# Patient Record
Sex: Female | Born: 1950
Health system: Southern US, Community
[De-identification: ages and names within clinical notes are randomized; demographics above are authoritative.]

## PROBLEM LIST (undated history)

## (undated) DIAGNOSIS — M109 Gout, unspecified: Secondary | ICD-10-CM

## (undated) DIAGNOSIS — D126 Benign neoplasm of colon, unspecified: Secondary | ICD-10-CM

## (undated) DIAGNOSIS — G4730 Sleep apnea, unspecified: Secondary | ICD-10-CM

## (undated) DIAGNOSIS — J449 Chronic obstructive pulmonary disease, unspecified: Secondary | ICD-10-CM

## (undated) DIAGNOSIS — I2510 Atherosclerotic heart disease of native coronary artery without angina pectoris: Secondary | ICD-10-CM

## (undated) DIAGNOSIS — A048 Other specified bacterial intestinal infections: Secondary | ICD-10-CM

## (undated) DIAGNOSIS — F419 Anxiety disorder, unspecified: Secondary | ICD-10-CM

## (undated) DIAGNOSIS — E119 Type 2 diabetes mellitus without complications: Secondary | ICD-10-CM

## (undated) DIAGNOSIS — K5792 Diverticulitis of intestine, part unspecified, without perforation or abscess without bleeding: Secondary | ICD-10-CM

## (undated) DIAGNOSIS — K3184 Gastroparesis: Secondary | ICD-10-CM

## (undated) DIAGNOSIS — K219 Gastro-esophageal reflux disease without esophagitis: Secondary | ICD-10-CM

## (undated) DIAGNOSIS — E785 Hyperlipidemia, unspecified: Secondary | ICD-10-CM

## (undated) DIAGNOSIS — D473 Essential (hemorrhagic) thrombocythemia: Secondary | ICD-10-CM

## (undated) DIAGNOSIS — I10 Essential (primary) hypertension: Secondary | ICD-10-CM

## (undated) DIAGNOSIS — F32A Depression, unspecified: Secondary | ICD-10-CM

## (undated) DIAGNOSIS — M1990 Unspecified osteoarthritis, unspecified site: Secondary | ICD-10-CM

## (undated) DIAGNOSIS — N289 Disorder of kidney and ureter, unspecified: Secondary | ICD-10-CM

## (undated) DIAGNOSIS — F329 Major depressive disorder, single episode, unspecified: Secondary | ICD-10-CM

## (undated) DIAGNOSIS — T7840XA Allergy, unspecified, initial encounter: Secondary | ICD-10-CM

## (undated) HISTORY — DX: Disorder of kidney and ureter, unspecified: N28.9

## (undated) HISTORY — DX: Essential (hemorrhagic) thrombocythemia: D47.3

## (undated) HISTORY — PX: TOTAL KNEE ARTHROPLASTY: SHX125

## (undated) HISTORY — DX: Atherosclerotic heart disease of native coronary artery without angina pectoris: I25.10

## (undated) HISTORY — DX: Hyperlipidemia, unspecified: E78.5

## (undated) HISTORY — DX: Anxiety disorder, unspecified: F41.9

## (undated) HISTORY — DX: Chronic obstructive pulmonary disease, unspecified: J44.9

## (undated) HISTORY — DX: Allergy, unspecified, initial encounter: T78.40XA

## (undated) HISTORY — DX: Gastroparesis: K31.84

## (undated) HISTORY — DX: Diverticulitis of intestine, part unspecified, without perforation or abscess without bleeding: K57.92

## (undated) HISTORY — PX: ABDOMINAL HYSTERECTOMY: SHX81

## (undated) HISTORY — DX: Gout, unspecified: M10.9

## (undated) HISTORY — DX: Benign neoplasm of colon, unspecified: D12.6

## (undated) HISTORY — DX: Sleep apnea, unspecified: G47.30

## (undated) HISTORY — DX: Depression, unspecified: F32.A

## (undated) HISTORY — PX: CORONARY ANGIOPLASTY WITH STENT PLACEMENT: SHX49

## (undated) HISTORY — DX: Unspecified osteoarthritis, unspecified site: M19.90

## (undated) HISTORY — DX: Essential (primary) hypertension: I10

## (undated) HISTORY — DX: Type 2 diabetes mellitus without complications: E11.9

## (undated) HISTORY — DX: Major depressive disorder, single episode, unspecified: F32.9

## (undated) HISTORY — PX: CATARACT EXTRACTION: SUR2

## (undated) HISTORY — DX: Other specified bacterial intestinal infections: A04.8

---

## 2005-03-14 ENCOUNTER — Ambulatory Visit: Payer: Self-pay | Admitting: Family Medicine

## 2006-03-14 ENCOUNTER — Ambulatory Visit: Payer: Self-pay | Admitting: Internal Medicine

## 2006-10-11 ENCOUNTER — Ambulatory Visit: Payer: Self-pay

## 2006-11-15 ENCOUNTER — Ambulatory Visit: Payer: Self-pay | Admitting: Family Medicine

## 2006-12-18 ENCOUNTER — Ambulatory Visit: Payer: Self-pay | Admitting: Family Medicine

## 2007-01-16 ENCOUNTER — Ambulatory Visit: Payer: Self-pay | Admitting: Family Medicine

## 2007-04-02 ENCOUNTER — Other Ambulatory Visit: Payer: Self-pay

## 2007-04-02 ENCOUNTER — Emergency Department: Payer: Self-pay | Admitting: Emergency Medicine

## 2007-04-04 ENCOUNTER — Ambulatory Visit: Payer: Self-pay | Admitting: Ophthalmology

## 2007-04-09 ENCOUNTER — Ambulatory Visit: Payer: Self-pay | Admitting: Ophthalmology

## 2008-05-18 ENCOUNTER — Ambulatory Visit: Payer: Self-pay | Admitting: Vascular Surgery

## 2008-07-19 ENCOUNTER — Emergency Department: Payer: Self-pay | Admitting: Emergency Medicine

## 2009-05-18 ENCOUNTER — Ambulatory Visit: Payer: Self-pay | Admitting: Family Medicine

## 2009-05-20 ENCOUNTER — Ambulatory Visit: Payer: Self-pay | Admitting: Family Medicine

## 2009-08-31 ENCOUNTER — Other Ambulatory Visit: Payer: Self-pay | Admitting: Internal Medicine

## 2009-09-01 ENCOUNTER — Ambulatory Visit: Payer: Self-pay | Admitting: Internal Medicine

## 2010-04-12 ENCOUNTER — Ambulatory Visit: Payer: Self-pay | Admitting: Family Medicine

## 2010-05-03 ENCOUNTER — Ambulatory Visit: Payer: Self-pay | Admitting: Orthopedic Surgery

## 2010-05-12 ENCOUNTER — Inpatient Hospital Stay: Payer: Self-pay | Admitting: Orthopedic Surgery

## 2010-05-18 ENCOUNTER — Ambulatory Visit: Payer: Self-pay | Admitting: Orthopedic Surgery

## 2010-09-15 ENCOUNTER — Ambulatory Visit: Payer: Self-pay | Admitting: Family Medicine

## 2011-01-26 ENCOUNTER — Ambulatory Visit: Payer: Self-pay | Admitting: Family Medicine

## 2012-02-07 ENCOUNTER — Ambulatory Visit: Payer: Self-pay | Admitting: Orthopedic Surgery

## 2012-02-07 LAB — CBC WITH DIFFERENTIAL/PLATELET
Eosinophil #: 0.1 10*3/uL (ref 0.0–0.7)
HGB: 13.7 g/dL (ref 12.0–16.0)
Lymphocyte #: 3.1 10*3/uL (ref 1.0–3.6)
Lymphocyte %: 48.9 %
Monocyte %: 8.5 %
Neutrophil #: 2.5 10*3/uL (ref 1.4–6.5)
Neutrophil %: 40.2 %
RBC: 4.51 10*6/uL (ref 3.80–5.20)
RDW: 16.9 % — ABNORMAL HIGH (ref 11.5–14.5)
WBC: 6.3 10*3/uL (ref 3.6–11.0)

## 2012-02-07 LAB — BASIC METABOLIC PANEL
Anion Gap: 9 (ref 7–16)
BUN: 12 mg/dL (ref 7–18)
Chloride: 107 mmol/L (ref 98–107)
Co2: 27 mmol/L (ref 21–32)
EGFR (Non-African Amer.): >60
Glucose: 144 mg/dL — ABNORMAL HIGH (ref 65–99)
Osmolality: 287 (ref 275–301)

## 2012-02-08 ENCOUNTER — Ambulatory Visit: Payer: Self-pay | Admitting: Orthopedic Surgery

## 2012-04-18 ENCOUNTER — Encounter: Payer: Self-pay | Admitting: Rheumatology

## 2012-05-05 ENCOUNTER — Encounter: Payer: Self-pay | Admitting: Rheumatology

## 2012-05-08 ENCOUNTER — Ambulatory Visit: Payer: Self-pay | Admitting: Orthopedic Surgery

## 2012-05-08 LAB — CBC WITH DIFFERENTIAL/PLATELET
Basophil #: 0 10*3/uL (ref 0.0–0.1)
Basophil %: 0.3 %
Eosinophil #: 0.2 10*3/uL (ref 0.0–0.7)
HCT: 40.9 % (ref 35.0–47.0)
HGB: 13.5 g/dL (ref 12.0–16.0)
Lymphocyte #: 4.4 10*3/uL — ABNORMAL HIGH (ref 1.0–3.6)
Lymphocyte %: 42.6 %
MCHC: 33 g/dL (ref 32.0–36.0)
Neutrophil #: 5 10*3/uL (ref 1.4–6.5)
Neutrophil %: 49.1 %
RDW: 17 % — ABNORMAL HIGH (ref 11.5–14.5)
WBC: 10.3 10*3/uL (ref 3.6–11.0)

## 2012-05-08 LAB — MRSA PCR SCREENING

## 2012-05-08 LAB — BASIC METABOLIC PANEL
Anion Gap: 4 — ABNORMAL LOW (ref 7–16)
BUN: 16 mg/dL (ref 7–18)
Calcium, Total: 9.3 mg/dL (ref 8.5–10.1)
Co2: 31 mmol/L (ref 21–32)
Creatinine: 0.71 mg/dL (ref 0.60–1.30)
EGFR (African American): >60
EGFR (Non-African Amer.): >60
Osmolality: 280 (ref 275–301)

## 2012-05-14 ENCOUNTER — Inpatient Hospital Stay: Payer: Self-pay | Admitting: Orthopedic Surgery

## 2012-05-15 LAB — BASIC METABOLIC PANEL
Anion Gap: 8 (ref 7–16)
Calcium, Total: 8.7 mg/dL (ref 8.5–10.1)
Co2: 24 mmol/L (ref 21–32)
Creatinine: 0.48 mg/dL — ABNORMAL LOW (ref 0.60–1.30)
EGFR (African American): >60
EGFR (Non-African Amer.): >60
Glucose: 155 mg/dL — ABNORMAL HIGH (ref 65–99)
Potassium: 3.6 mmol/L (ref 3.5–5.1)
Sodium: 136 mmol/L (ref 136–145)

## 2012-05-15 LAB — HEMOGLOBIN: HGB: 12.6 g/dL (ref 12.0–16.0)

## 2012-05-15 LAB — PLATELET COUNT: Platelet: 264 10*3/uL (ref 150–440)

## 2012-06-03 ENCOUNTER — Encounter: Payer: Self-pay | Admitting: Orthopedic Surgery

## 2012-06-04 ENCOUNTER — Encounter: Payer: Self-pay | Admitting: Orthopedic Surgery

## 2012-07-05 ENCOUNTER — Encounter: Payer: Self-pay | Admitting: Orthopedic Surgery

## 2012-09-24 ENCOUNTER — Ambulatory Visit: Payer: Self-pay | Admitting: Family Medicine

## 2013-09-19 ENCOUNTER — Ambulatory Visit: Payer: Self-pay | Admitting: Family Medicine

## 2013-10-02 ENCOUNTER — Ambulatory Visit: Payer: Self-pay | Admitting: Family Medicine

## 2013-11-12 ENCOUNTER — Encounter: Payer: Self-pay | Admitting: Family Medicine

## 2013-11-13 ENCOUNTER — Ambulatory Visit: Payer: Self-pay | Admitting: Family Medicine

## 2013-12-03 ENCOUNTER — Encounter: Payer: Self-pay | Admitting: Family Medicine

## 2014-01-02 ENCOUNTER — Encounter: Payer: Self-pay | Admitting: Family Medicine

## 2014-01-02 HISTORY — PX: COLONOSCOPY: SHX174

## 2014-01-20 ENCOUNTER — Ambulatory Visit: Payer: Self-pay | Admitting: Gastroenterology

## 2014-01-20 DIAGNOSIS — D126 Benign neoplasm of colon, unspecified: Secondary | ICD-10-CM

## 2014-01-20 HISTORY — DX: Benign neoplasm of colon, unspecified: D12.6

## 2014-01-21 LAB — PATHOLOGY REPORT

## 2014-09-05 DIAGNOSIS — G4733 Obstructive sleep apnea (adult) (pediatric): Secondary | ICD-10-CM | POA: Diagnosis not present

## 2014-09-16 ENCOUNTER — Ambulatory Visit: Payer: Self-pay | Admitting: Family Medicine

## 2014-09-16 DIAGNOSIS — I10 Essential (primary) hypertension: Secondary | ICD-10-CM | POA: Diagnosis not present

## 2014-09-16 DIAGNOSIS — K30 Functional dyspepsia: Secondary | ICD-10-CM | POA: Diagnosis not present

## 2014-09-16 DIAGNOSIS — G4733 Obstructive sleep apnea (adult) (pediatric): Secondary | ICD-10-CM | POA: Diagnosis not present

## 2014-09-16 DIAGNOSIS — R5383 Other fatigue: Secondary | ICD-10-CM | POA: Diagnosis not present

## 2014-09-16 DIAGNOSIS — R946 Abnormal results of thyroid function studies: Secondary | ICD-10-CM | POA: Diagnosis not present

## 2014-09-16 DIAGNOSIS — R079 Chest pain, unspecified: Secondary | ICD-10-CM | POA: Diagnosis not present

## 2014-09-16 DIAGNOSIS — I2510 Atherosclerotic heart disease of native coronary artery without angina pectoris: Secondary | ICD-10-CM | POA: Diagnosis not present

## 2014-10-06 DIAGNOSIS — G4733 Obstructive sleep apnea (adult) (pediatric): Secondary | ICD-10-CM | POA: Diagnosis not present

## 2014-10-06 DIAGNOSIS — E1142 Type 2 diabetes mellitus with diabetic polyneuropathy: Secondary | ICD-10-CM | POA: Diagnosis not present

## 2014-10-06 DIAGNOSIS — R011 Cardiac murmur, unspecified: Secondary | ICD-10-CM | POA: Diagnosis not present

## 2014-10-06 DIAGNOSIS — L851 Acquired keratosis [keratoderma] palmaris et plantaris: Secondary | ICD-10-CM | POA: Diagnosis not present

## 2014-10-06 DIAGNOSIS — R0602 Shortness of breath: Secondary | ICD-10-CM | POA: Diagnosis not present

## 2014-10-06 DIAGNOSIS — B351 Tinea unguium: Secondary | ICD-10-CM | POA: Diagnosis not present

## 2014-10-06 DIAGNOSIS — R079 Chest pain, unspecified: Secondary | ICD-10-CM | POA: Diagnosis not present

## 2014-10-06 DIAGNOSIS — I209 Angina pectoris, unspecified: Secondary | ICD-10-CM | POA: Diagnosis not present

## 2014-10-15 DIAGNOSIS — R0602 Shortness of breath: Secondary | ICD-10-CM | POA: Diagnosis not present

## 2014-10-15 DIAGNOSIS — R079 Chest pain, unspecified: Secondary | ICD-10-CM | POA: Diagnosis not present

## 2014-10-15 DIAGNOSIS — R011 Cardiac murmur, unspecified: Secondary | ICD-10-CM | POA: Diagnosis not present

## 2014-10-29 DIAGNOSIS — R0602 Shortness of breath: Secondary | ICD-10-CM | POA: Diagnosis not present

## 2014-10-29 DIAGNOSIS — I10 Essential (primary) hypertension: Secondary | ICD-10-CM | POA: Diagnosis not present

## 2014-10-29 DIAGNOSIS — E119 Type 2 diabetes mellitus without complications: Secondary | ICD-10-CM | POA: Diagnosis not present

## 2014-10-29 DIAGNOSIS — E784 Other hyperlipidemia: Secondary | ICD-10-CM | POA: Diagnosis not present

## 2014-11-04 DIAGNOSIS — G4733 Obstructive sleep apnea (adult) (pediatric): Secondary | ICD-10-CM | POA: Diagnosis not present

## 2014-11-24 DIAGNOSIS — E11618 Type 2 diabetes mellitus with other diabetic arthropathy: Secondary | ICD-10-CM | POA: Diagnosis not present

## 2014-11-24 DIAGNOSIS — M7502 Adhesive capsulitis of left shoulder: Secondary | ICD-10-CM | POA: Diagnosis not present

## 2014-11-24 DIAGNOSIS — G4733 Obstructive sleep apnea (adult) (pediatric): Secondary | ICD-10-CM | POA: Diagnosis not present

## 2014-11-24 DIAGNOSIS — E1143 Type 2 diabetes mellitus with diabetic autonomic (poly)neuropathy: Secondary | ICD-10-CM | POA: Diagnosis not present

## 2014-11-24 DIAGNOSIS — A048 Other specified bacterial intestinal infections: Secondary | ICD-10-CM | POA: Diagnosis not present

## 2014-11-24 DIAGNOSIS — K3184 Gastroparesis: Secondary | ICD-10-CM | POA: Diagnosis not present

## 2014-11-24 DIAGNOSIS — M109 Gout, unspecified: Secondary | ICD-10-CM | POA: Diagnosis not present

## 2014-11-24 DIAGNOSIS — N183 Chronic kidney disease, stage 3 (moderate): Secondary | ICD-10-CM | POA: Diagnosis not present

## 2014-12-05 DIAGNOSIS — G4733 Obstructive sleep apnea (adult) (pediatric): Secondary | ICD-10-CM | POA: Diagnosis not present

## 2014-12-09 DIAGNOSIS — I10 Essential (primary) hypertension: Secondary | ICD-10-CM | POA: Diagnosis not present

## 2014-12-09 DIAGNOSIS — E785 Hyperlipidemia, unspecified: Secondary | ICD-10-CM | POA: Diagnosis not present

## 2014-12-09 DIAGNOSIS — M109 Gout, unspecified: Secondary | ICD-10-CM | POA: Diagnosis not present

## 2014-12-09 DIAGNOSIS — D473 Essential (hemorrhagic) thrombocythemia: Secondary | ICD-10-CM | POA: Diagnosis not present

## 2014-12-09 DIAGNOSIS — A048 Other specified bacterial intestinal infections: Secondary | ICD-10-CM | POA: Diagnosis not present

## 2014-12-22 ENCOUNTER — Ambulatory Visit
Admit: 2014-12-22 | Disposition: A | Discharge status: Home / Self Care | Payer: Self-pay | Attending: Hematology and Oncology | Admitting: Hematology and Oncology

## 2014-12-22 DIAGNOSIS — Z79899 Other long term (current) drug therapy: Secondary | ICD-10-CM | POA: Diagnosis not present

## 2014-12-22 DIAGNOSIS — D473 Essential (hemorrhagic) thrombocythemia: Secondary | ICD-10-CM | POA: Diagnosis not present

## 2014-12-22 DIAGNOSIS — K5790 Diverticulosis of intestine, part unspecified, without perforation or abscess without bleeding: Secondary | ICD-10-CM | POA: Diagnosis not present

## 2014-12-22 DIAGNOSIS — D124 Benign neoplasm of descending colon: Secondary | ICD-10-CM | POA: Diagnosis not present

## 2014-12-22 DIAGNOSIS — D125 Benign neoplasm of sigmoid colon: Secondary | ICD-10-CM | POA: Diagnosis not present

## 2014-12-22 DIAGNOSIS — B9681 Helicobacter pylori [H. pylori] as the cause of diseases classified elsewhere: Secondary | ICD-10-CM | POA: Diagnosis not present

## 2014-12-22 DIAGNOSIS — Z7982 Long term (current) use of aspirin: Secondary | ICD-10-CM | POA: Diagnosis not present

## 2014-12-22 DIAGNOSIS — D509 Iron deficiency anemia, unspecified: Secondary | ICD-10-CM | POA: Diagnosis not present

## 2014-12-22 DIAGNOSIS — R634 Abnormal weight loss: Secondary | ICD-10-CM | POA: Diagnosis not present

## 2014-12-22 LAB — CBC CANCER CENTER
Bands: 1 %
Eosinophil: 3 %
HCT: 29.5 % — ABNORMAL LOW (ref 35.0–47.0)
HGB: 8.8 g/dL — ABNORMAL LOW (ref 12.0–16.0)
Lymphocytes: 36 %
MCH: 22.2 pg — ABNORMAL LOW (ref 26.0–34.0)
MCHC: 30 g/dL — ABNORMAL LOW (ref 32.0–36.0)
MCV: 74 fL — ABNORMAL LOW (ref 80–100)
Monocytes: 11 %
Platelet: 428 x10 3/mm (ref 150–440)
RBC: 3.99 10*6/uL (ref 3.80–5.20)
RDW: 17.2 % — ABNORMAL HIGH (ref 11.5–14.5)
Segmented Neutrophils: 49 %
WBC: 6.8 x10 3/mm (ref 3.6–11.0)

## 2014-12-22 LAB — RETICULOCYTES
Absolute Retic Count: 0.085 10*6/uL (ref 0.019–0.186)
Reticulocyte: 2.1 % (ref 0.4–3.1)

## 2014-12-22 LAB — IRON AND TIBC
Iron Bind.Cap.(Total): 518 — ABNORMAL HIGH (ref 250–450)
Iron Saturation: 3.1
Iron: 16 ug/dL — ABNORMAL LOW
Unbound Iron-Bind.Cap.: 502.4

## 2014-12-22 LAB — FERRITIN: Ferritin (ARMC): 4 ng/mL — ABNORMAL LOW

## 2014-12-22 LAB — FOLATE: Folic Acid: 23.3 ng/mL

## 2014-12-22 LAB — SEDIMENTATION RATE: Erythrocyte Sed Rate: 20 mm/hr (ref 0–30)

## 2014-12-22 NOTE — Discharge Summary (Signed)
PATIENT NAME:  Dana Sparks, MAHADEO MR#:  [id number] DATE OF BIRTH:  09-26-1950  DATE OF ADMISSION:  05/14/2012 DATE OF DISCHARGE:  05/16/2012  ADMITTING DIAGNOSIS: Unstable right total knee.   DISCHARGE DIAGNOSIS: Unstable right total knee.   PROCEDURE: Revision of polyethylene insert right total knee.   ANESTHESIA: Spinal.   SURGEON: Laurene Footman, MD    ASSISTANT: Rachelle Hora, PA-C   IMPLANT: Triathlon X3 tibial bearing insert PS size 411 mm.   HISTORY: The patient is a 64 year old female who had a total knee on 05/12/2010. She did very well but slowly developed increased pain. She had arthroscopy on 02/08/2012 that gave her some relief but the pain has now returned. The pain is 7 out of 10. She has pain with rest as well as with activity. She is currently taking ibuprofen for pain. She notes getting a locking sensation with walking. The patient recently saw Dr. Rudene Christians on 04/19/2012 and the patient's pain was thought to be due to mid flexion instability of the right knee.   PHYSICAL EXAMINATION: GENERAL: Well developed, well nourished female in no apparent distress. Normal affect. Normal gait. No antalgic component. HEENT: Head is normocephalic, atraumatic. Pupils equal, round, reactive to light. The patient wears glasses. She has an upper partial. HEART: Regular rate and rhythm. There is no murmur. There is normal apical pulse. LUNGS: Clear to auscultation. There is no wheeze, rhonchi, or crackles. There is normal expansion of bilateral chest walls. RIGHT LOWER EXTREMITY: Examination of the right knee shows a mild effusion. No erythema. No warmth. No bony abnormalities noted. The patient is tender along the medial and lateral joint lines. There is no noted posterior joint effusion. She has extension to 10 degrees and flexion to 95 degrees. There is pain with mid flexion. There is instability of the knee with mid flexion. NEUROLOGIC: There is good quad control. There is no noted atrophy. The patient  has a negative straight leg raise. The patient has normal sensation to light touch. VASCULAR: The patient had less than 2 second capillary refill. Dorsalis pedis and posterior tibial pulses are intact.   HOSPITAL COURSE: The patient was admitted to the hospital on 05/14/2012. She had surgery that same day and was brought to the orthopedic floor from the PAC-U in stable condition. On postop day one the patient's vitals and lab work was monitored and were both stable. The patient had good progress with physical therapy. On 05/16/2012 the patient was stable and ready for discharge home with physical therapy.   DISCHARGE INSTRUCTIONS:  1. The patient may gradually increase weightbearing on the affected extremity.  2. Knee-high TED hose on both legs and remove at bedtime. Replace when arising the next morning.   DIET: Resume a regular diet as tolerated.   MEDICATIONS:  1. Coated aspirin 325 mg once a day.  2. Tylenol 650 to 1000 mg every six hours as needed for pain. 3. Oxycodone 5 to 10 mg every four hours as needed for pain.   WOUND CARE:  1. Continue using the [facility] Care unit maintaining a temperature between 40 and 50 degrees.  2. Do not get the dressing or bandage wet or dirty.  3. Call [facility] if the dressing gets water under it.  4. Leave dressing on.   SYMPTOMS TO REPORT: Call [place] if any of the following occur:  1. Bright red bleeding from the incision wound, fever above 101.5 degrees, redness, swelling, or drainage at the incision.  2. Call [facility] [place] if you experience any increased leg pain, numbness, or weakness in legs or bowel or bladder symptoms.   REFERRAL: The patient is referred to home with home physical therapy. She is to call [facility] if a therapist has not contacted her within 48 hours of her return home.   FOLLOW-UP: Follow-up appointment on 05/29/2012 at 8:45 with Rachelle Hora,  PA-C.   DISCHARGE MEDICATIONS:  1. Crestor 10 mg oral tablet 1 tablet orally once a day in the morning.  2. Isosorbide mononitrate 60 mg oral tablet extended-release 1 tablet orally once a day in the morning.  3. Benazepril 20 mg oral tablet 1 tablet orally once a day in the morning.  4. Metoclopramide 10 mg oral tablet 1 tablet orally once a day in the morning. 5. Pantoprazole 40 mg oral delayed-release tablet 1 tablet orally once a day in the morning. ' 6. Metoprolol succinate ER 50 mg oral tablet extended-release 1 tablet orally once a day in the morning.  7. Ketoconazole 2% topical cream apply topically to affected area once a day beneath the breasts.   8. Allopurinol 100 mg oral tablet 1 tablet orally once a day in the morning.  9. Colcrys 0.6 mg oral tablet 1 tablet once a day in the morning as needed.  10. Metformin 1000 mg oral tablet 1 tablet orally 2 times a day.  11. Citalopram 20 mg oral tablet 1 tablet orally once a day in the morning.  12. Estradiol 0.5 mg oral tablet 1 tablet orally once a day in the morning.  13. Potassium chloride 10 mEq oral capsule extended-release one cap orally once a day as needed.  14. Fexofenadine 180 mg oral tablet 1 tablet orally once a day in the morning.  15. Cyclobenzaprine 10 mg oral tablet 1 tablet orally 3 times a day as needed.  16. Amlodipine 5 mg oral tablet 1 tablet orally once a day in the morning.  17. Torsemide 20 mg oral tablet 1 tablet orally once a day in the morning.  18. Glipizide 5 mg oral tablet 1 tablet orally once a day in the morning.  19. Actos 30 mL oral tablet 1 tablet orally once a day in the morning.  20. Hydrochloroquine 200 mg oral tablet 1 tablet orally once a day in the morning.   ____________________________ Duanne Guess, PA-C tcg:drc D: 05/16/2012 13:44:55 ET T: 05/16/2012 15:55:03 ET JOB#: [id number]  cc: Duanne Guess, PA-C, <Dictator> Duanne Guess [place] ELECTRONICALLY SIGNED 06/04/2012 13:49

## 2014-12-22 NOTE — Op Note (Signed)
PATIENT NAME:  Dana Sparks, Dana Sparks MR#:  [id number] DATE OF BIRTH:  [birth year]  DATE OF PROCEDURE:  05/14/2012  PREOPERATIVE DIAGNOSIS:  Unstable right total knee.  POSTOPERATIVE DIAGNOSIS:  Unstable right total knee.  PROCEDURE: Revision polyethylene insert right total knee.   ANESTHESIA: Spinal.   SURGEON: Laurene Footman, M.D.   ASSISTANT: Rachelle Hora, PA-C   DESCRIPTION OF PROCEDURE: The patient was brought to the operating room and after adequate anesthesia was obtained, the right leg was prepped and draped in the usual sterile fashion with a tourniquet applied to the upper thigh. After appropriate patient identification and time-out procedures were completed, the prior midline incision was opened followed by a medial parapatellar arthrotomy. Inspection of the knee revealed synovitis within the gutters. This was debrided, normal appearing synovial fluid. There was noted to be instability in mid flexion under direct visualization. The tibial component was then removed without difficulty using a small osteotome and posterior synovitis was debrided at this time as well.  0.25% Sensorcaine with epinephrine was infiltrated into the posterior knee for postoperative analgesia at this time. After thoroughly irrigating the knee and debriding synovium, previously had a 9- mm insert, a size 4 11-mm PS trial was inserted with excellent stability in mid flexion. Full extension was also possible and flexion to 115 to 20 degrees obtained. This was chosen as the final component. After again thoroughly irrigating the knee the final component was impacted into place. The knee was again stable through a range of motion. The arthrotomy was then repaired using a heavy quill suture, 2-0 quill subcutaneously followed by skin staples. Xeroform, 4 x 4's, ABD, Webril, and Ace wrap were applied and the patient was sent to the recovery room in stable condition.   ESTIMATED BLOOD LOSS: 50 mL.   COMPLICATIONS: None.    SPECIMEN: None.   TOURNIQUET TIME:  0. No tourniquet required.   IMPLANT: Triathlon X3 tibial-bearing insert PS size 4 11-mm.    ____________________________ Laurene Footman, MD mjm:bjt D: 05/14/2012 19:59:39 ET T: 05/15/2012 08:45:26 ET JOB#: [id number]  cc: Laurene Footman, MD, <Dictator> Laurene Footman MD ELECTRONICALLY SIGNED 05/15/2012 11:52

## 2014-12-24 DIAGNOSIS — D124 Benign neoplasm of descending colon: Secondary | ICD-10-CM | POA: Diagnosis not present

## 2014-12-24 DIAGNOSIS — D473 Essential (hemorrhagic) thrombocythemia: Secondary | ICD-10-CM | POA: Diagnosis not present

## 2014-12-24 DIAGNOSIS — R634 Abnormal weight loss: Secondary | ICD-10-CM | POA: Diagnosis not present

## 2014-12-24 DIAGNOSIS — D509 Iron deficiency anemia, unspecified: Secondary | ICD-10-CM | POA: Diagnosis not present

## 2014-12-24 DIAGNOSIS — D125 Benign neoplasm of sigmoid colon: Secondary | ICD-10-CM | POA: Diagnosis not present

## 2014-12-24 DIAGNOSIS — Z79899 Other long term (current) drug therapy: Secondary | ICD-10-CM | POA: Diagnosis not present

## 2014-12-24 DIAGNOSIS — B9681 Helicobacter pylori [H. pylori] as the cause of diseases classified elsewhere: Secondary | ICD-10-CM | POA: Diagnosis not present

## 2014-12-24 DIAGNOSIS — K5790 Diverticulosis of intestine, part unspecified, without perforation or abscess without bleeding: Secondary | ICD-10-CM | POA: Diagnosis not present

## 2014-12-24 DIAGNOSIS — Z7982 Long term (current) use of aspirin: Secondary | ICD-10-CM | POA: Diagnosis not present

## 2014-12-25 DIAGNOSIS — B9681 Helicobacter pylori [H. pylori] as the cause of diseases classified elsewhere: Secondary | ICD-10-CM | POA: Diagnosis not present

## 2014-12-25 DIAGNOSIS — K5790 Diverticulosis of intestine, part unspecified, without perforation or abscess without bleeding: Secondary | ICD-10-CM | POA: Diagnosis not present

## 2014-12-25 DIAGNOSIS — D125 Benign neoplasm of sigmoid colon: Secondary | ICD-10-CM | POA: Diagnosis not present

## 2014-12-25 DIAGNOSIS — D473 Essential (hemorrhagic) thrombocythemia: Secondary | ICD-10-CM | POA: Diagnosis not present

## 2014-12-25 DIAGNOSIS — D509 Iron deficiency anemia, unspecified: Secondary | ICD-10-CM | POA: Diagnosis not present

## 2014-12-25 DIAGNOSIS — D124 Benign neoplasm of descending colon: Secondary | ICD-10-CM | POA: Diagnosis not present

## 2014-12-25 DIAGNOSIS — Z79899 Other long term (current) drug therapy: Secondary | ICD-10-CM | POA: Diagnosis not present

## 2014-12-25 DIAGNOSIS — R634 Abnormal weight loss: Secondary | ICD-10-CM | POA: Diagnosis not present

## 2014-12-25 DIAGNOSIS — Z7982 Long term (current) use of aspirin: Secondary | ICD-10-CM | POA: Diagnosis not present

## 2014-12-25 LAB — OCCULT BLOOD X 1 CARD TO LAB, STOOL
Occult Blood, Feces: NEGATIVE
Occult Blood, Feces: NEGATIVE

## 2014-12-27 NOTE — Op Note (Signed)
PATIENT NAME:  Dana Sparks, Dana Sparks MR#:  [id number] DATE OF BIRTH:  11/28/1950  DATE OF PROCEDURE:  02/08/2012  PREOPERATIVE DIAGNOSIS: Arthrofibrosis, right total knee.   POSTOPERATIVE DIAGNOSIS: Arthrofibrosis, right total knee.   PROCEDURE: Partial synovectomy.   SURGEON: Laurene Footman, MD   ANESTHESIA: General.   DESCRIPTION OF PROCEDURE: The patient was brought to the Operating Room, and after adequate anesthesia was attained the leg was prepped and draped in the usual sterile fashion with a tourniquet and arthroscopic legholder applied. After appropriate patient identification and timeout procedures were completed, an inferolateral portal was made and the arthroscope was introduced. Initial inspection revealed extensive scarring around the notch with some loose fragments of fibrous tissue floating within the knee. An inferomedial portal was made and a probe introduced. There was extensive scar tissue and arthrofibrosis in the notch as well as with bands around the patella essentially appearing to be like tough ropes that were tethering the knee and were consistent with the patient's complaints of feeling that something was blocking the knee. An arthroscopic shaver was then inserted, care being taken not to damage the metal or polyethylene. The shaver was then used to resect away the scar tissue, first in the notch, getting around the post, and then going to the medial and lateral sides removing impinging soft tissue as well as loose fragments of scar tissue. Going to the suprapatellar pouch at this point, the tight bands were removed as well as some of the scar around the patella. After this was complete, all instrumentation was withdrawn, and the wounds were closed using simple interrupted 4-0 nylon. Xeroform, 4 x 4's, Webril, and Ace wrap were applied. The patient was sent to the recovery room in stable condition.   ESTIMATED BLOOD LOSS: Minimal.   COMPLICATIONS: None.   SPECIMEN: None.   TOURNIQUET TIME: The tourniquet was raised during the procedure to minimize bleeding and help with visualization. Tourniquet time was 17 minutes at 300 mmHg.    ____________________________ Laurene Footman, MD mjm:cbb D: 02/08/2012 19:28:02 ET T: 02/09/2012 10:28:46 ET JOB#: [id number]  cc: Laurene Footman, MD, <Dictator> Laurene Footman MD ELECTRONICALLY SIGNED 02/09/2012 12:11

## 2014-12-31 DIAGNOSIS — D509 Iron deficiency anemia, unspecified: Secondary | ICD-10-CM | POA: Diagnosis not present

## 2014-12-31 DIAGNOSIS — D473 Essential (hemorrhagic) thrombocythemia: Secondary | ICD-10-CM | POA: Diagnosis not present

## 2015-01-04 DIAGNOSIS — G4733 Obstructive sleep apnea (adult) (pediatric): Secondary | ICD-10-CM | POA: Diagnosis not present

## 2015-01-11 DIAGNOSIS — D509 Iron deficiency anemia, unspecified: Secondary | ICD-10-CM | POA: Diagnosis not present

## 2015-01-11 DIAGNOSIS — A048 Other specified bacterial intestinal infections: Secondary | ICD-10-CM | POA: Diagnosis not present

## 2015-01-26 ENCOUNTER — Other Ambulatory Visit: Payer: Self-pay | Admitting: Family Medicine

## 2015-01-26 DIAGNOSIS — E1142 Type 2 diabetes mellitus with diabetic polyneuropathy: Secondary | ICD-10-CM | POA: Diagnosis not present

## 2015-01-26 DIAGNOSIS — Z1231 Encounter for screening mammogram for malignant neoplasm of breast: Secondary | ICD-10-CM

## 2015-01-26 DIAGNOSIS — B351 Tinea unguium: Secondary | ICD-10-CM | POA: Diagnosis not present

## 2015-01-26 DIAGNOSIS — L851 Acquired keratosis [keratoderma] palmaris et plantaris: Secondary | ICD-10-CM | POA: Diagnosis not present

## 2015-01-27 ENCOUNTER — Ambulatory Visit
Admission: RE | Admit: 2015-01-27 | Discharge: 2015-01-27 | Disposition: A | Discharge status: Home / Self Care | Payer: Commercial Managed Care - HMO | Source: Ambulatory Visit | Attending: Family Medicine | Admitting: Family Medicine

## 2015-01-27 DIAGNOSIS — Z1231 Encounter for screening mammogram for malignant neoplasm of breast: Secondary | ICD-10-CM | POA: Insufficient documentation

## 2015-01-28 ENCOUNTER — Other Ambulatory Visit: Payer: Self-pay

## 2015-01-28 DIAGNOSIS — D473 Essential (hemorrhagic) thrombocythemia: Secondary | ICD-10-CM

## 2015-01-28 DIAGNOSIS — D75839 Thrombocytosis, unspecified: Secondary | ICD-10-CM

## 2015-01-28 HISTORY — DX: Thrombocytosis, unspecified: D75.839

## 2015-02-02 ENCOUNTER — Inpatient Hospital Stay: Payer: Commercial Managed Care - HMO

## 2015-02-02 ENCOUNTER — Inpatient Hospital Stay: Payer: Commercial Managed Care - HMO | Attending: Hematology and Oncology | Admitting: Hematology and Oncology

## 2015-02-02 ENCOUNTER — Encounter (INDEPENDENT_AMBULATORY_CARE_PROVIDER_SITE_OTHER): Payer: Self-pay

## 2015-02-02 VITALS — BP 114/74 | HR 80 | Temp 97.9°F | Ht 65.5 in | Wt 213.2 lb

## 2015-02-02 DIAGNOSIS — J449 Chronic obstructive pulmonary disease, unspecified: Secondary | ICD-10-CM | POA: Insufficient documentation

## 2015-02-02 DIAGNOSIS — M109 Gout, unspecified: Secondary | ICD-10-CM | POA: Insufficient documentation

## 2015-02-02 DIAGNOSIS — B9681 Helicobacter pylori [H. pylori] as the cause of diseases classified elsewhere: Secondary | ICD-10-CM | POA: Insufficient documentation

## 2015-02-02 DIAGNOSIS — I2510 Atherosclerotic heart disease of native coronary artery without angina pectoris: Secondary | ICD-10-CM | POA: Insufficient documentation

## 2015-02-02 DIAGNOSIS — D75839 Thrombocytosis, unspecified: Secondary | ICD-10-CM

## 2015-02-02 DIAGNOSIS — G4730 Sleep apnea, unspecified: Secondary | ICD-10-CM | POA: Diagnosis not present

## 2015-02-02 DIAGNOSIS — Z87891 Personal history of nicotine dependence: Secondary | ICD-10-CM | POA: Diagnosis not present

## 2015-02-02 DIAGNOSIS — E119 Type 2 diabetes mellitus without complications: Secondary | ICD-10-CM | POA: Insufficient documentation

## 2015-02-02 DIAGNOSIS — K5792 Diverticulitis of intestine, part unspecified, without perforation or abscess without bleeding: Secondary | ICD-10-CM | POA: Diagnosis not present

## 2015-02-02 DIAGNOSIS — Z7982 Long term (current) use of aspirin: Secondary | ICD-10-CM

## 2015-02-02 DIAGNOSIS — E785 Hyperlipidemia, unspecified: Secondary | ICD-10-CM | POA: Diagnosis not present

## 2015-02-02 DIAGNOSIS — D473 Essential (hemorrhagic) thrombocythemia: Secondary | ICD-10-CM

## 2015-02-02 DIAGNOSIS — N289 Disorder of kidney and ureter, unspecified: Secondary | ICD-10-CM | POA: Diagnosis not present

## 2015-02-02 DIAGNOSIS — K3184 Gastroparesis: Secondary | ICD-10-CM | POA: Diagnosis not present

## 2015-02-02 DIAGNOSIS — D649 Anemia, unspecified: Secondary | ICD-10-CM | POA: Diagnosis not present

## 2015-02-02 DIAGNOSIS — M1990 Unspecified osteoarthritis, unspecified site: Secondary | ICD-10-CM | POA: Insufficient documentation

## 2015-02-02 DIAGNOSIS — F329 Major depressive disorder, single episode, unspecified: Secondary | ICD-10-CM | POA: Diagnosis not present

## 2015-02-02 DIAGNOSIS — F419 Anxiety disorder, unspecified: Secondary | ICD-10-CM

## 2015-02-02 DIAGNOSIS — Z809 Family history of malignant neoplasm, unspecified: Secondary | ICD-10-CM | POA: Diagnosis not present

## 2015-02-02 DIAGNOSIS — Z800 Family history of malignant neoplasm of digestive organs: Secondary | ICD-10-CM

## 2015-02-02 DIAGNOSIS — I10 Essential (primary) hypertension: Secondary | ICD-10-CM | POA: Diagnosis not present

## 2015-02-02 DIAGNOSIS — Z79899 Other long term (current) drug therapy: Secondary | ICD-10-CM | POA: Diagnosis not present

## 2015-02-02 DIAGNOSIS — D509 Iron deficiency anemia, unspecified: Secondary | ICD-10-CM

## 2015-02-02 LAB — IRON AND TIBC
Iron: 179 ug/dL — ABNORMAL HIGH (ref 28–170)
Saturation Ratios: 43 % — ABNORMAL HIGH (ref 10.4–31.8)
TIBC: 413 ug/dL (ref 250–450)
UIBC: 234 ug/dL

## 2015-02-02 LAB — CBC WITH DIFFERENTIAL/PLATELET
Basophils Absolute: 0 10*3/uL (ref 0–0.1)
Basophils Relative: 1 %
Eosinophils Absolute: 0.2 10*3/uL (ref 0–0.7)
Eosinophils Relative: 3 %
HCT: 33.7 % — ABNORMAL LOW (ref 35.0–47.0)
Hemoglobin: 10.3 g/dL — ABNORMAL LOW (ref 12.0–16.0)
Lymphocytes Relative: 46 %
Lymphs Abs: 3.1 10*3/uL (ref 1.0–3.6)
MCH: 23.4 pg — ABNORMAL LOW (ref 26.0–34.0)
MCHC: 30.4 g/dL — ABNORMAL LOW (ref 32.0–36.0)
MCV: 77.1 fL — ABNORMAL LOW (ref 80.0–100.0)
Monocytes Absolute: 0.5 10*3/uL (ref 0.2–0.9)
Monocytes Relative: 7 %
Neutro Abs: 2.9 10*3/uL (ref 1.4–6.5)
Neutrophils Relative %: 43 %
Platelets: 350 10*3/uL (ref 150–440)
RBC: 4.37 MIL/uL (ref 3.80–5.20)
RDW: 21.2 % — ABNORMAL HIGH (ref 11.5–14.5)
WBC: 6.7 10*3/uL (ref 3.6–11.0)

## 2015-02-02 LAB — FERRITIN: Ferritin: 8 ng/mL — ABNORMAL LOW (ref 11–307)

## 2015-02-02 LAB — SEDIMENTATION RATE: Sed Rate: 18 mm/hr (ref 0–30)

## 2015-02-02 NOTE — Progress Notes (Signed)
Pt here today for follow up regarding thrombocytosis; offers no complaints today

## 2015-02-04 DIAGNOSIS — G4733 Obstructive sleep apnea (adult) (pediatric): Secondary | ICD-10-CM | POA: Diagnosis not present

## 2015-02-16 ENCOUNTER — Ambulatory Visit: Payer: Commercial Managed Care - HMO | Admitting: Anesthesiology

## 2015-02-16 ENCOUNTER — Ambulatory Visit
Admission: RE | Admit: 2015-02-16 | Discharge: 2015-02-16 | Disposition: A | Discharge status: Home / Self Care | Payer: Commercial Managed Care - HMO | Source: Ambulatory Visit | Attending: Gastroenterology | Admitting: Gastroenterology

## 2015-02-16 ENCOUNTER — Telehealth: Payer: Self-pay | Admitting: Family Medicine

## 2015-02-16 ENCOUNTER — Encounter
Admission: RE | Disposition: A | Discharge status: Home / Self Care | Payer: Self-pay | Source: Ambulatory Visit | Attending: Gastroenterology

## 2015-02-16 DIAGNOSIS — G4730 Sleep apnea, unspecified: Secondary | ICD-10-CM | POA: Diagnosis not present

## 2015-02-16 DIAGNOSIS — E785 Hyperlipidemia, unspecified: Secondary | ICD-10-CM | POA: Diagnosis not present

## 2015-02-16 DIAGNOSIS — Z87891 Personal history of nicotine dependence: Secondary | ICD-10-CM | POA: Insufficient documentation

## 2015-02-16 DIAGNOSIS — Z885 Allergy status to narcotic agent status: Secondary | ICD-10-CM | POA: Diagnosis not present

## 2015-02-16 DIAGNOSIS — I10 Essential (primary) hypertension: Secondary | ICD-10-CM | POA: Insufficient documentation

## 2015-02-16 DIAGNOSIS — K5792 Diverticulitis of intestine, part unspecified, without perforation or abscess without bleeding: Secondary | ICD-10-CM | POA: Insufficient documentation

## 2015-02-16 DIAGNOSIS — A045 Campylobacter enteritis: Secondary | ICD-10-CM | POA: Diagnosis not present

## 2015-02-16 DIAGNOSIS — Z79899 Other long term (current) drug therapy: Secondary | ICD-10-CM | POA: Insufficient documentation

## 2015-02-16 DIAGNOSIS — E139 Other specified diabetes mellitus without complications: Secondary | ICD-10-CM

## 2015-02-16 DIAGNOSIS — I2510 Atherosclerotic heart disease of native coronary artery without angina pectoris: Secondary | ICD-10-CM | POA: Insufficient documentation

## 2015-02-16 DIAGNOSIS — K319 Disease of stomach and duodenum, unspecified: Secondary | ICD-10-CM | POA: Diagnosis not present

## 2015-02-16 DIAGNOSIS — N289 Disorder of kidney and ureter, unspecified: Secondary | ICD-10-CM | POA: Diagnosis not present

## 2015-02-16 DIAGNOSIS — R12 Heartburn: Secondary | ICD-10-CM | POA: Insufficient documentation

## 2015-02-16 DIAGNOSIS — F419 Anxiety disorder, unspecified: Secondary | ICD-10-CM | POA: Insufficient documentation

## 2015-02-16 DIAGNOSIS — M1990 Unspecified osteoarthritis, unspecified site: Secondary | ICD-10-CM | POA: Diagnosis not present

## 2015-02-16 DIAGNOSIS — D473 Essential (hemorrhagic) thrombocythemia: Secondary | ICD-10-CM | POA: Diagnosis not present

## 2015-02-16 DIAGNOSIS — J449 Chronic obstructive pulmonary disease, unspecified: Secondary | ICD-10-CM | POA: Insufficient documentation

## 2015-02-16 DIAGNOSIS — E119 Type 2 diabetes mellitus without complications: Secondary | ICD-10-CM | POA: Diagnosis not present

## 2015-02-16 DIAGNOSIS — R1013 Epigastric pain: Secondary | ICD-10-CM | POA: Diagnosis not present

## 2015-02-16 DIAGNOSIS — F329 Major depressive disorder, single episode, unspecified: Secondary | ICD-10-CM | POA: Insufficient documentation

## 2015-02-16 HISTORY — PX: ESOPHAGOGASTRODUODENOSCOPY: SHX5428

## 2015-02-16 LAB — GLUCOSE, CAPILLARY: GLUCOSE-CAPILLARY: 126 mg/dL — AB (ref 65–99)

## 2015-02-16 SURGERY — EGD (ESOPHAGOGASTRODUODENOSCOPY)
Anesthesia: General

## 2015-02-16 MED ORDER — FENTANYL CITRATE (PF) 100 MCG/2ML IJ SOLN
INTRAMUSCULAR | Status: DC | PRN
  Administered 2015-02-16: 50 ug via INTRAVENOUS

## 2015-02-16 MED ORDER — LIDOCAINE HCL (CARDIAC) 20 MG/ML IV SOLN
INTRAVENOUS | Status: DC | PRN
  Administered 2015-02-16: 30 mg via INTRAVENOUS

## 2015-02-16 MED ORDER — MIDAZOLAM HCL 5 MG/5ML IJ SOLN
INTRAMUSCULAR | Status: DC | PRN
  Administered 2015-02-16: 1 mg via INTRAVENOUS

## 2015-02-16 MED ORDER — SODIUM CHLORIDE 0.9 % IV SOLN
INTRAVENOUS | Status: DC
  Administered 2015-02-16: 1000 mL via INTRAVENOUS
  Administered 2015-02-16: 11:00:00 via INTRAVENOUS

## 2015-02-16 MED ORDER — PROPOFOL 10 MG/ML IV BOLUS
INTRAVENOUS | Status: DC | PRN
  Administered 2015-02-16: 50 mg via INTRAVENOUS

## 2015-02-16 MED ORDER — PROPOFOL INFUSION 10 MG/ML OPTIME
INTRAVENOUS | Status: DC | PRN
  Administered 2015-02-16: 150 ug/kg/min via INTRAVENOUS

## 2015-02-16 NOTE — Transfer of Care (Signed)
Immediate Anesthesia Transfer of Care Note  Patient: Dana Sparks  Procedure(s) Performed: Procedure(s): ESOPHAGOGASTRODUODENOSCOPY (EGD) (N/A)  Patient Location: PACU  Anesthesia Type:General  Level of Consciousness: awake, oriented and patient cooperative  Airway & Oxygen Therapy: Patient Spontanous Breathing and Patient connected to nasal cannula oxygen  Post-op Assessment: Report given to RN and Post -op Vital signs reviewed and stable  Post vital signs: Reviewed and stable  Last Vitals:  Filed Vitals:   02/16/15 1123  BP:   Pulse:   Temp: 36.4 C  Resp:     Complications: No apparent anesthesia complications

## 2015-02-16 NOTE — Telephone Encounter (Signed)
PT IS NEEDING REFERRAL SENT TO [facility] WITH DR WCHJSC. HER APPT IS 03-03-2015.

## 2015-02-16 NOTE — Op Note (Signed)
[facility] Gastroenterology Patient Name: Dana Sparks Procedure Date: 02/16/2015 10:08 AM MRN: [id number] Account #: [id number] Date of Birth: 03-16-1951 Admit Type: Outpatient Age: 64 Room: [facility] ENDO ROOM 1 Gender: Female Note Status: Finalized Procedure:         Upper GI endoscopy Indications:       Epigastric abdominal pain Providers:         Lucilla Lame, MD Referring MD:      Bethena Roys. Sowles, MD (Referring MD) Medicines:         Propofol per Anesthesia Complications:     No immediate complications. Procedure:         Pre-Anesthesia Assessment:                    - Prior to the procedure, a History and Physical was                     performed, and patient medications and allergies were                     reviewed. The patient's tolerance of previous anesthesia                     was also reviewed. The risks and benefits of the procedure                     and the sedation options and risks were discussed with the                     patient. All questions were answered, and informed consent                     was obtained. Prior Anticoagulants: The patient has taken                     no previous anticoagulant or antiplatelet agents. ASA                     Grade Assessment: II - A patient with mild systemic                     disease. After reviewing the risks and benefits, the                     patient was deemed in satisfactory condition to undergo                     the procedure.                    After obtaining informed consent, the endoscope was passed                     under direct vision. Throughout the procedure, the                     patient's blood pressure, pulse, and oxygen saturations                     were monitored continuously. The Olympus GIF-160 endoscope                     (S#. [id number]) was introduced through the mouth, and  advanced to the second part of duodenum. The upper GI   endoscopy was accomplished without difficulty. The patient                     tolerated the procedure well. Findings:      The examined esophagus was normal.      The entire examined stomach was normal. Biopsies were taken with a cold       forceps for Helicobacter pylori testing.      The examined duodenum was normal. Impression:        - Normal esophagus.                    - Normal stomach. Biopsied.                    - Normal examined duodenum. Recommendation:    - Await pathology results. Procedure Code(s): --- Professional ---                    [phone], Esophagogastroduodenoscopy, flexible, transoral;                     with biopsy, single or multiple Diagnosis Code(s): --- Professional ---                    R10.13, Epigastric pain CPT copyright 2014 American Medical Association. All rights reserved. The codes documented in this report are preliminary and upon coder review may  be revised to meet current compliance requirements. Lucilla Lame, MD 02/16/2015 11:11:03 AM This report has been signed electronically. Number of Addenda: 0 Note Initiated On: 02/16/2015 10:08 AM      [facility]

## 2015-02-16 NOTE — Anesthesia Postprocedure Evaluation (Signed)
  Anesthesia Post-op Note  Patient: Dana Sparks  Procedure(s) Performed: Procedure(s): ESOPHAGOGASTRODUODENOSCOPY (EGD) (N/A)  Anesthesia type:General  Patient location: PACU  Post pain: Pain level controlled  Post assessment: Post-op Vital signs reviewed, Patient's Cardiovascular Status Stable, Respiratory Function Stable, Patent Airway and No signs of Nausea or vomiting  Post vital signs: Reviewed and stable  Last Vitals:  Filed Vitals:   02/16/15 1150  BP: 120/57  Pulse: 65  Temp:   Resp: 12    Level of consciousness: awake, alert  and patient cooperative  Complications: No apparent anesthesia complications

## 2015-02-16 NOTE — Anesthesia Preprocedure Evaluation (Signed)
Anesthesia Evaluation  Patient identified by MRN, date of birth, ID band Patient awake    Reviewed: Allergy & Precautions, NPO status , Patient's Chart, lab work & pertinent test results, reviewed documented beta blocker date and time   History of Anesthesia Complications Negative for: history of anesthetic complications  Airway Mallampati: II  TM Distance: >3 FB Neck ROM: Full    Dental  (+) Chipped,    Pulmonary sleep apnea , COPDformer smoker,  breath sounds clear to auscultation  Pulmonary exam normal       Cardiovascular Exercise Tolerance: Poor hypertension, Pt. on medications and Pt. on home beta blockers + CAD Normal cardiovascular examRhythm:Regular Rate:Normal     Neuro/Psych PSYCHIATRIC DISORDERS Anxiety Depression negative neurological ROS     GI/Hepatic Neg liver ROS, GERD-  Medicated and Controlled,  Endo/Other  diabetes, Well Controlled, Type 2, Oral Hypoglycemic Agents  Renal/GU Renal InsufficiencyRenal disease  negative genitourinary   Musculoskeletal  (+) Arthritis -, Osteoarthritis,    Abdominal   Peds negative pediatric ROS (+)  Hematology negative hematology ROS (+)   Anesthesia Other Findings   Reproductive/Obstetrics negative OB ROS                             Anesthesia Physical Anesthesia Plan  ASA: III  Anesthesia Plan: General   Post-op Pain Management:    Induction: Intravenous  Airway Management Planned: Nasal Cannula  Additional Equipment:   Intra-op Plan:   Post-operative Plan:   Informed Consent: I have reviewed the patients History and Physical, chart, labs and discussed the procedure including the risks, benefits and alternatives for the proposed anesthesia with the patient or authorized representative who has indicated his/her understanding and acceptance.   Dental advisory given  Plan Discussed with: CRNA and Surgeon  Anesthesia Plan  Comments:         Anesthesia Quick Evaluation

## 2015-02-16 NOTE — H&P (Signed)
[facility]  [address], [place], [place] [address] Phone: [phone] Fax : [phone]  Primary Care Physician:  Loistine Chance, MD Primary Gastroenterologist:  Dr. Allen Norris  Pre-Procedure History & Physical: HPI:  NEENAH CANTER is a 64 y.o. female is here for an endoscopy.   Past Medical History  Diagnosis Date  . Diverticulitis   . Allergy   . Positive H. pylori test   . Renal insufficiency   . Gastroparesis   . Anxiety   . Sleep apnea   . COPD (chronic obstructive pulmonary disease)   . Gout   . Diabetes mellitus without complication   . Depression   . Arthritis   . Hypertension   . CAD (coronary artery disease)   . Hyperlipemia   . Thrombocytosis 01/28/2015    Past Surgical History  Procedure Laterality Date  . Abdominal hysterectomy      total  . Total knee arthroplasty Right   . Cataract extraction    . Coronary angioplasty with stent placement      Prior to Admission medications   Medication Sig Start Date End Date Taking? Authorizing Provider  allopurinol (ZYLOPRIM) 100 MG tablet Take 100 mg by mouth daily.    Historical Provider, MD  amLODipine (NORVASC) 5 MG tablet Take 5 mg by mouth daily.    Historical Provider, MD  aspirin EC 81 MG tablet Take 81 mg by mouth daily.    Historical Provider, MD  citalopram (CELEXA) 20 MG tablet Take 20 mg by mouth daily.    Historical Provider, MD  estradiol (ESTRACE) 0.5 MG tablet Take 0.5 mg by mouth daily.    Historical Provider, MD  ferrous sulfate 325 (65 FE) MG tablet Take 325 mg by mouth daily with breakfast.    Historical Provider, MD  fexofenadine (ALLEGRA) 180 MG tablet Take 180 mg by mouth daily.    Historical Provider, MD  fluticasone (FLOVENT DISKUS) 50 MCG/BLIST diskus inhaler Inhale 1 puff into the lungs 2 (two) times daily.    Historical Provider, MD  glipiZIDE (GLUCOTROL) 5 MG tablet Take by mouth. 2 tabs in morning, 1 in evening    Historical Provider, MD  isosorbide mononitrate  (IMDUR) 60 MG 24 hr tablet Take 60 mg by mouth daily.    Historical Provider, MD  lisinopril (PRINIVIL,ZESTRIL) 40 MG tablet Take 40 mg by mouth 2 (two) times daily.    Historical Provider, MD  metoprolol (LOPRESSOR) 50 MG tablet Take 50 mg by mouth daily.    Historical Provider, MD  pantoprazole (PROTONIX) 40 MG tablet Take 40 mg by mouth daily.    Historical Provider, MD  potassium chloride (K-DUR,KLOR-CON) 10 MEQ tablet Take 10 mEq by mouth daily as needed.    Historical Provider, MD  ranitidine (ZANTAC) 150 MG tablet Take 150 mg by mouth 2 (two) times daily as needed for heartburn.    Historical Provider, MD  rosuvastatin (CRESTOR) 20 MG tablet Take 20 mg by mouth daily.    Historical Provider, MD  sitaGLIPtin-metformin (JANUMET) 50-1000 MG per tablet Take 1 tablet by mouth 2 (two) times daily with a meal.    Historical Provider, MD  torsemide (DEMADEX) 20 MG tablet Take 20 mg by mouth daily.    Historical Provider, MD    Allergies as of 02/15/2015 - Review Complete 02/02/2015  Allergen Reaction Noted  . Codeine  12/31/2014    Family History  Problem Relation Age of Onset  . Cancer Sister     unsure  History   Social History  . Marital Status: Married    Spouse Name: N/A  . Number of Children: N/A  . Years of Education: N/A   Occupational History  . Not on file.   Social History Main Topics  . Smoking status: Former Smoker -- 1.00 packs/day for 40 years    Types: Cigarettes    Quit date: 12/30/2012  . Smokeless tobacco: Not on file  . Alcohol Use: Not on file  . Drug Use: Not on file  . Sexual Activity: Not on file   Other Topics Concern  . Not on file   Social History Narrative    Review of Systems: See HPI, otherwise negative ROS  Physical Exam: BP 141/63 mmHg  Pulse 63  Temp(Src) 96.5 F (35.8 C) (Tympanic)  Resp 17  SpO2 100% General:   Alert,  pleasant and cooperative in NAD Head:  Normocephalic and atraumatic. Neck:  Supple; no masses or  thyromegaly. Lungs:  Clear throughout to auscultation.    Heart:  Regular rate and rhythm. Abdomen:  Soft, nontender and nondistended. Normal bowel sounds, without guarding, and without rebound.   Neurologic:  Alert and  oriented x4;  grossly normal neurologically.  Impression/Plan: [name] is here for an endoscopy to be performed for epigastric pain  Risks, benefits, limitations, and alternatives regarding  endoscopy have been reviewed with the patient.  Questions have been answered.  All parties agreeable.   Ollen Bowl, MD  02/16/2015, 10:50 AM

## 2015-02-16 NOTE — Telephone Encounter (Signed)
For what reason? We need to know before referral is made.  Is it for DM? Yearly exam?

## 2015-02-17 ENCOUNTER — Encounter: Payer: Self-pay | Admitting: Gastroenterology

## 2015-02-17 LAB — SURGICAL PATHOLOGY

## 2015-02-17 NOTE — Telephone Encounter (Signed)
[place]

## 2015-02-17 NOTE — Telephone Encounter (Signed)
Appt is scheduled on 03/03/15 with Dr. Philis Kendall at [facility] at [facility] approval  [place] # [id number] 03/02/15-08/29/15

## 2015-02-17 NOTE — Telephone Encounter (Signed)
PT STATED THAT IT IS HER YEARLY EXAM.

## 2015-02-20 ENCOUNTER — Encounter: Payer: Self-pay | Admitting: Family Medicine

## 2015-02-20 DIAGNOSIS — N183 Chronic kidney disease, stage 3 (moderate): Secondary | ICD-10-CM

## 2015-02-20 DIAGNOSIS — I10 Essential (primary) hypertension: Secondary | ICD-10-CM | POA: Insufficient documentation

## 2015-02-20 DIAGNOSIS — E1122 Type 2 diabetes mellitus with diabetic chronic kidney disease: Secondary | ICD-10-CM | POA: Insufficient documentation

## 2015-02-20 DIAGNOSIS — E785 Hyperlipidemia, unspecified: Secondary | ICD-10-CM | POA: Insufficient documentation

## 2015-02-20 DIAGNOSIS — G4733 Obstructive sleep apnea (adult) (pediatric): Secondary | ICD-10-CM | POA: Insufficient documentation

## 2015-02-20 DIAGNOSIS — G4730 Sleep apnea, unspecified: Secondary | ICD-10-CM | POA: Insufficient documentation

## 2015-02-24 ENCOUNTER — Ambulatory Visit (INDEPENDENT_AMBULATORY_CARE_PROVIDER_SITE_OTHER): Payer: Commercial Managed Care - HMO | Admitting: Family Medicine

## 2015-02-24 ENCOUNTER — Encounter: Payer: Self-pay | Admitting: Family Medicine

## 2015-02-24 VITALS — BP 108/64 | HR 86 | Temp 97.5°F | Resp 18 | Ht 63.25 in | Wt 208.5 lb

## 2015-02-24 DIAGNOSIS — I2510 Atherosclerotic heart disease of native coronary artery without angina pectoris: Secondary | ICD-10-CM | POA: Insufficient documentation

## 2015-02-24 DIAGNOSIS — E1143 Type 2 diabetes mellitus with diabetic autonomic (poly)neuropathy: Secondary | ICD-10-CM | POA: Diagnosis not present

## 2015-02-24 DIAGNOSIS — Z780 Asymptomatic menopausal state: Secondary | ICD-10-CM | POA: Diagnosis not present

## 2015-02-24 DIAGNOSIS — N183 Chronic kidney disease, stage 3 unspecified: Secondary | ICD-10-CM | POA: Insufficient documentation

## 2015-02-24 DIAGNOSIS — M109 Gout, unspecified: Secondary | ICD-10-CM | POA: Insufficient documentation

## 2015-02-24 DIAGNOSIS — M7500 Adhesive capsulitis of unspecified shoulder: Secondary | ICD-10-CM

## 2015-02-24 DIAGNOSIS — R7989 Other specified abnormal findings of blood chemistry: Secondary | ICD-10-CM

## 2015-02-24 DIAGNOSIS — E669 Obesity, unspecified: Secondary | ICD-10-CM | POA: Diagnosis not present

## 2015-02-24 DIAGNOSIS — E785 Hyperlipidemia, unspecified: Secondary | ICD-10-CM | POA: Diagnosis not present

## 2015-02-24 DIAGNOSIS — Z955 Presence of coronary angioplasty implant and graft: Secondary | ICD-10-CM | POA: Diagnosis not present

## 2015-02-24 DIAGNOSIS — I340 Nonrheumatic mitral (valve) insufficiency: Secondary | ICD-10-CM | POA: Insufficient documentation

## 2015-02-24 DIAGNOSIS — K219 Gastro-esophageal reflux disease without esophagitis: Secondary | ICD-10-CM | POA: Insufficient documentation

## 2015-02-24 DIAGNOSIS — K3184 Gastroparesis: Principal | ICD-10-CM

## 2015-02-24 DIAGNOSIS — I272 Pulmonary hypertension, unspecified: Secondary | ICD-10-CM | POA: Insufficient documentation

## 2015-02-24 DIAGNOSIS — Z8619 Personal history of other infectious and parasitic diseases: Secondary | ICD-10-CM

## 2015-02-24 DIAGNOSIS — E11618 Type 2 diabetes mellitus with other diabetic arthropathy: Secondary | ICD-10-CM | POA: Insufficient documentation

## 2015-02-24 DIAGNOSIS — F330 Major depressive disorder, recurrent, mild: Secondary | ICD-10-CM | POA: Insufficient documentation

## 2015-02-24 DIAGNOSIS — J3089 Other allergic rhinitis: Secondary | ICD-10-CM | POA: Insufficient documentation

## 2015-02-24 DIAGNOSIS — I071 Rheumatic tricuspid insufficiency: Secondary | ICD-10-CM | POA: Insufficient documentation

## 2015-02-24 LAB — POCT GLYCOSYLATED HEMOGLOBIN (HGB A1C): Hemoglobin A1C: 5.6

## 2015-02-24 MED ORDER — GLIPIZIDE 5 MG PO TABS: 5.0000 mg | ORAL_TABLET | Freq: Two times a day (BID) | ORAL | Status: DC

## 2015-02-24 MED ORDER — ROSUVASTATIN CALCIUM 20 MG PO TABS: 20.0000 mg | ORAL_TABLET | Freq: Every day | ORAL | Status: DC

## 2015-02-24 NOTE — Progress Notes (Signed)
Name: Dana Sparks   MRN: [id number]    DOB: 11/17/1950   Date:02/24/2015       Progress Note  Subjective  Chief Complaint  Chief Complaint  Patient presents with  . Diabetes    not checking BG  . Hypertension  . Hyperlipidemia  . Gastrophageal Reflux  . COPD    HPI  H. Pylori history: she was diagnosed earlier this year, she was having a lot of dyspepsia. We tried treating her with antibiotics but she was unable to tolerate it. She was referred to Dr. Durwin Reges and Oncologist because of severe anemia.  Notes not available for review.  EGD was neg, she is taking ferrous sulfate twice daily and she states she has been compliant. No longer has epigastric pain.   DMII with gastroparesis: she is taking medication as prescribed, glucose at home has not been checked lately. Her hgbA1C is down to 5.6%, she has been following a diabetic diet and has decreased portion size . She denies hypoglycemia, no polyphagia or polydipsia.  HTN: at goal, no side effects  CAD: s/p stent placement, no chest pain or palpitation. Currently not taking aspirin because of EGD, but advised to resume it. Continue Imdur to prevent anginal symptoms.    Patient Active Problem List   Diagnosis Date Noted  . CAD (coronary artery disease), native coronary artery 02/24/2015  . History of coronary artery stent placement 02/24/2015  . Chronic kidney disease, stage 3, mod decreased GFR 02/24/2015  . Menopause 02/24/2015  . History of Helicobacter pylori infection 02/24/2015  . Mild mitral insufficiency 02/24/2015  . Mild tricuspid insufficiency 02/24/2015  . 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 Controlled gout 02/24/2015  . Inflammatory arthritis 02/24/2015  . Depression 02/24/2015  . Obesity (BMI 30-39.9) 02/24/2015  . Mild pulmonary hypertension 02/24/2015  . Gastroesophageal reflux disease without esophagitis 02/24/2015  . HLD (hyperlipidemia) 02/20/2015  . Hypertension,  benign 02/20/2015  . Apnea, sleep 02/20/2015  . Type 2 diabetes, uncontrolled, with gastroparesis 02/20/2015  . Thrombocytosis 01/28/2015    Past Surgical History  Procedure Laterality Date  . Abdominal hysterectomy      total  . Total knee arthroplasty Right   . Cataract extraction    . Coronary angioplasty with stent placement    . Esophagogastroduodenoscopy N/A 02/16/2015    Procedure: ESOPHAGOGASTRODUODENOSCOPY (EGD);  Surgeon: Lucilla Lame, MD;  Location: [facility] ENDOSCOPY;  Service: Endoscopy;  Laterality: N/A;    Family History  Problem Relation Age of Onset  . Cancer Sister     unsure    History   Social History  . Marital Status: Married    Spouse Name: N/A  . Number of Children: N/A  . Years of Education: N/A   Occupational History  . Not on file.   Social History Main Topics  . Smoking status: Former Smoker -- 1.00 packs/day for 40 years    Types: Cigarettes    Quit date: 12/30/2012  . Smokeless tobacco: Never Used  . Alcohol Use: No  . Drug Use: No  . Sexual Activity: Not Currently   Other Topics Concern  . Not on file   Social History Narrative     Current outpatient prescriptions:  .  allopurinol (ZYLOPRIM) 100 MG tablet, Take 100 mg by mouth daily., Disp: , Rfl:  .  amLODipine (NORVASC) 5 MG tablet, Take 5 mg by mouth daily., Disp: , Rfl:  .  aspirin EC 81 MG tablet, Take 81 mg  by mouth daily., Disp: , Rfl:  .  citalopram (CELEXA) 20 MG tablet, Take 20 mg by mouth daily., Disp: , Rfl:  .  estradiol (ESTRACE) 0.5 MG tablet, Take 0.5 mg by mouth daily., Disp: , Rfl:  .  ferrous sulfate 325 (65 FE) MG tablet, Take 325 mg by mouth daily with breakfast., Disp: , Rfl:  .  fexofenadine (ALLEGRA) 180 MG tablet, Take 180 mg by mouth daily., Disp: , Rfl:  .  fluticasone (FLOVENT DISKUS) 50 MCG/BLIST diskus inhaler, Inhale 1 puff into the lungs 2 (two) times daily., Disp: , Rfl:  .  glipiZIDE (GLUCOTROL) 5 MG tablet, Take by mouth. 2 tabs in morning, 1 in  evening, Disp: , Rfl:  .  isosorbide mononitrate (IMDUR) 60 MG 24 hr tablet, Take 60 mg by mouth daily., Disp: , Rfl:  .  lisinopril (PRINIVIL,ZESTRIL) 40 MG tablet, Take 40 mg by mouth 2 (two) times daily., Disp: , Rfl:  .  metoprolol succinate (TOPROL-XL) 50 MG 24 hr tablet, Take 1 tablet by mouth daily., Disp: , Rfl:  .  pantoprazole (PROTONIX) 40 MG tablet, Take 40 mg by mouth daily., Disp: , Rfl:  .  potassium chloride (K-DUR,KLOR-CON) 10 MEQ tablet, Take 10 mEq by mouth daily as needed., Disp: , Rfl:  .  rosuvastatin (CRESTOR) 20 MG tablet, Take 20 mg by mouth daily., Disp: , Rfl:  .  sitaGLIPtin-metformin (JANUMET) 50-1000 MG per tablet, Take 1 tablet by mouth 2 (two) times daily with a meal., Disp: , Rfl:  .  torsemide (DEMADEX) 20 MG tablet, Take 20 mg by mouth daily., Disp: , Rfl:   Allergies  Allergen Reactions  . Codeine   . Contrast Media [Iodinated Diagnostic Agents] Itching     ROS  Constitutional: Negative for fever or significant weight change.  Respiratory: Negative for cough and shortness of breath.   Cardiovascular: Negative for chest pain or palpitations.  Gastrointestinal: Negative for abdominal pain, no bowel changes.  Musculoskeletal: Negative for gait problem or joint swelling.  Skin: Negative for rash.  Neurological: Negative for dizziness or headache.  No other specific complaints in a complete review of systems (except as listed in HPI above). She also states she has hot flashes and night sweats  Objective  Filed Vitals:   02/24/15 1020  BP: 108/64  Pulse: 86  Temp: 97.5 F (36.4 C)  TempSrc: Oral  Resp: 18  Height: 5' 3.25" (1.607 m)  Weight: 208 lb 8 oz (94.575 kg)  SpO2: 97%    Body mass index is 36.62 kg/(m^2).  Physical Exam   Constitutional: Patient appears well-developed and well-nourished. No distress.  HENT: Head: Normocephalic and atraumatic.  Nose: Nose normal. Mouth/Throat: Oropharynx is clear and moist. No oropharyngeal exudate.   Eyes: Conjunctivae and EOM are normal. Pupils are equal, round, and reactive to light. No scleral icterus.  Neck: Normal range of motion. Neck supple. No JVD present. No thyromegaly present.  Cardiovascular: Normal rate, regular rhythm and normal heart sounds.  No murmur heard. No BLE edema. Pulmonary/Chest: Effort normal and breath sounds normal. No respiratory distress. Abdominal: Soft. Bowel sounds are normal, no distension. There is no tenderness. no masses Musculoskeletal: Normal range of motion, no joint effusions. No gross deformities Neurological: he is alert and oriented to person, place, and time. No cranial nerve deficit. Coordination, balance, strength, speech and gait are normal.  Skin: Skin is warm and dry. No rash noted. No erythema.  Psychiatric: Patient has a normal mood and affect. behavior  is normal. Judgment and thought content normal.  Recent Results (from the past 2160 hour(s))  [facility]     Status: Abnormal   Collection Time: 12/22/14  3:45 PM  Result Value Ref Range   WBC 6.8 3.6-11.0 x10 3/mm    RBC 3.99 3.80-5.20 x10 6/mm    HGB 8.8 (L) 12.0-16.0 g/dL   HCT 29.5 (L) 35.0-47.0 %   MCV 74 (L) 80-100 fL   MCH 22.2 (L) 26.0-34.0 pg   MCHC 30.0 (L) 32.0-36.0 g/dL   RDW 17.2 (H) 11.5-14.5 %   Platelet 428 150-440 x10 3/mm     Comment: hgb and hct - RESULTS VERIFIED BY REPEAT TESTING.   Bands 1 %   Segmented Neutrophils 49 %   Lymphocytes 36 %   Monocytes 11 %   Eosinophil 3 %   Comment - H1-Com1 ANISOCYTOSIS    Comment - H1-Com2 HYPOCHROMIA    Comment - H1-Com3 MICROCYTES PRESENT    Comment - H1-Com4 PLTS VARIED IN SIZE   Reticulocytes     Status: None   Collection Time: 12/22/14  3:45 PM  Result Value Ref Range   Reticulocyte 2.1 0.4-3.1 %   Absolute Retic Count 0.085 0.019-0.186 x10 6/uL  Sedimentation rate     Status: None   Collection Time: 12/22/14  3:45 PM  Result Value Ref Range   Erythrocyte Sed Rate 20 0-30 mm/hr  Ferritin     Status:  Abnormal   Collection Time: 12/22/14  3:45 PM  Result Value Ref Range   Ferritin ([facility]) 4 (L) ng/mL    Comment: 11-307 NOTE: New Reference Range  11/10/14   Iron and TIBC     Status: Abnormal   Collection Time: 12/22/14  3:45 PM  Result Value Ref Range   Iron 16 (L) ug/dL    Comment: 28-170 NOTE: New Reference Range:  11/10/14    Iron Bind.Cap.(Total) 518 (H) 250-450   Unbound Iron-Bind.Cap. 502.4    Iron Saturation 3.1   Folate     Status: None   Collection Time: 12/22/14  3:45 PM  Result Value Ref Range   Folic Acid 53.6 ng/mL    Comment: 3.1-100.0 NOTE: New Reference Range:  11/10/14   Occult blood card to lab, stool     Status: None   Collection Time: 12/24/14 10:26 AM  Result Value Ref Range   Occult Blood, Feces NEGATIVE NEGATIVE  Occult blood card to lab, stool     Status: None   Collection Time: 12/25/14  3:25 PM  Result Value Ref Range   Occult Blood, Feces NEGATIVE NEGATIVE  CBC with Differential     Status: Abnormal   Collection Time: 02/02/15  2:16 PM  Result Value Ref Range   WBC 6.7 3.6 - 11.0 K/uL   RBC 4.37 3.80 - 5.20 MIL/uL   Hemoglobin 10.3 (L) 12.0 - 16.0 g/dL   HCT 33.7 (L) 35.0 - 47.0 %   MCV 77.1 (L) 80.0 - 100.0 fL   MCH 23.4 (L) 26.0 - 34.0 pg   MCHC 30.4 (L) 32.0 - 36.0 g/dL   RDW 21.2 (H) 11.5 - 14.5 %   Platelets 350 150 - 440 K/uL   Neutrophils Relative % 43 %   Neutro Abs 2.9 1.4 - 6.5 K/uL   Lymphocytes Relative 46 %   Lymphs Abs 3.1 1.0 - 3.6 K/uL   Monocytes Relative 7 %   Monocytes Absolute 0.5 0.2 - 0.9 K/uL   Eosinophils Relative 3 %  Eosinophils Absolute 0.2 0 - 0.7 K/uL   Basophils Relative 1 %   Basophils Absolute 0.0 0 - 0.1 K/uL  Ferritin     Status: Abnormal   Collection Time: 02/02/15  2:16 PM  Result Value Ref Range   Ferritin 8 (L) 11 - 307 ng/mL  Iron and TIBC     Status: Abnormal   Collection Time: 02/02/15  2:16 PM  Result Value Ref Range   Iron 179 (H) 28 - 170 ug/dL   TIBC 413 250 - 450 ug/dL    Saturation Ratios 43 (H) 10.4 - 31.8 %   UIBC 234 ug/dL  Sedimentation rate     Status: None   Collection Time: 02/02/15  2:16 PM  Result Value Ref Range   Sed Rate 18 0 - 30 mm/hr  Glucose, capillary     Status: Abnormal   Collection Time: 02/16/15 10:08 AM  Result Value Ref Range   Glucose-Capillary 126 (H) 65 - 99 mg/dL  Surgical pathology     Status: None   Collection Time: 02/16/15 11:09 AM  Result Value Ref Range   SURGICAL PATHOLOGY      Surgical Pathology CASE: [id number] PATIENT: Ellsworth Lennox Surgical Pathology Report     SPECIMEN SUBMITTED: A. Stomach, antrum, cold biopsy  CLINICAL HISTORY: Epigastric pain  PRE-OPERATIVE DIAGNOSIS: H pylori  POST-OPERATIVE DIAGNOSIS: Normal EGD     DIAGNOSIS: A. STOMACH, ANTRUM; COLD BIOPSY: - ANTRAL MUCOSA WITH ONE TINY FOCUS OF INTESTINAL METAPLASIA. - NO ACTIVE INFLAMMATION, DYSPLASIA, OR MALIGNANCY. - NO HELICOBACTER PYLORI SEEN IN HEMATOXYLIN AND EOSIN SECTIONS.   GROSS DESCRIPTION: A. Labeled: biopsy of antrum Tissue Fragment(s): 2 Measurement: 0.3-0.4 cm Comment: Tan Entirely submitted in cassette(s): 1  Final Diagnosis performed by Bryan Lemma, MD.  Electronically signed 02/17/2015 11:43:50AM    The electronic signature indicates that the named Attending Pathologist has evaluated the specimen  Technical component performed at [facility], [address] [place], [place] [address] Lab: [phone] Dir: Darrick Penna. Evette Doffing, MD  Profession al component performed at [facility], [facility], [place], [place], [place] [address] Lab: [phone] Dir: Dellia Nims. Rubinas, MD    POCT HgB A1C     Status: Normal   Collection Time: 02/24/15 10:47 AM  Result Value Ref Range   Hemoglobin A1C 5.6     Diabetic Foot Exam: Diabetic Foot Exam - Simple   Simple Foot Form  Visual Inspection  No deformities, no ulcerations, no other skin breakdown bilaterally:  Yes  Sensation Testing  Intact to  touch and monofilament testing bilaterally:  Yes  Pulse Check  Posterior Tibialis and Dorsalis pulse intact bilaterally:  Yes  Comments      PHQ2/9: Depression screen PHQ 2/9 02/24/2015  Decreased Interest 0  Down, Depressed, Hopeless 0  PHQ - 2 Score 0     Fall Risk: Fall Risk  02/24/2015  Falls in the past year? No     Assessment & Plan  1. Diabetic gastroparesis We will decrease dose of glipizide toi 5 mg twice daily  to avoid hypoglycemia, hgbA1C is down ,  - POCT HgB A1C  2. Coronary artery disease involving native coronary artery of native heart without angina pectoris Resume aspirin 81mg   3. History of coronary artery stent placement Keep follow up with Dr. Clayborn Bigness yearly  4. Chronic kidney disease, stage 3, mod decreased GFR stable  5. Menopause Discussed wearing layers  6. History of Helicobacter pylori infection Treated, feeling better, anemia was improving, based on  labs done at oncologist, continue  iron suppolementation   6. Obesity (BMI 30-39.9) Discussed life style modification  7. Hyperlipidemia  continue Crestor - rosuvastatin (CRESTOR) 20 MG tablet; Take 1 tablet (20 mg total) by mouth daily.  Dispense: 90 tablet; Refill: 3  8. Abnormal TSH Recheck level - TSH

## 2015-03-01 ENCOUNTER — Other Ambulatory Visit: Payer: Self-pay

## 2015-03-03 DIAGNOSIS — E119 Type 2 diabetes mellitus without complications: Secondary | ICD-10-CM | POA: Diagnosis not present

## 2015-03-03 LAB — HM DIABETES FOOT EXAM: HM Diabetic Foot Exam: NORMAL

## 2015-03-06 DIAGNOSIS — G4733 Obstructive sleep apnea (adult) (pediatric): Secondary | ICD-10-CM | POA: Diagnosis not present

## 2015-03-10 ENCOUNTER — Telehealth: Payer: Self-pay

## 2015-03-10 NOTE — Telephone Encounter (Signed)
-----   Message from Lucilla Lame, MD sent at 03/02/2015  9:14 AM EDT ----- Please have the patient come in to discuss the pathology results.

## 2015-03-10 NOTE — Telephone Encounter (Signed)
Pt scheduled follow up with Brennan Bailey on Thursday, July 7th.

## 2015-03-11 ENCOUNTER — Encounter: Payer: Self-pay | Admitting: Urgent Care

## 2015-03-11 ENCOUNTER — Ambulatory Visit (INDEPENDENT_AMBULATORY_CARE_PROVIDER_SITE_OTHER): Payer: Commercial Managed Care - HMO | Admitting: Urgent Care

## 2015-03-11 VITALS — BP 116/79 | HR 73 | Temp 97.8°F | Ht 65.5 in | Wt 212.0 lb

## 2015-03-11 DIAGNOSIS — Z8619 Personal history of other infectious and parasitic diseases: Secondary | ICD-10-CM | POA: Diagnosis not present

## 2015-03-11 DIAGNOSIS — K31A0 Gastric intestinal metaplasia, unspecified: Secondary | ICD-10-CM | POA: Insufficient documentation

## 2015-03-11 DIAGNOSIS — K3189 Other diseases of stomach and duodenum: Secondary | ICD-10-CM | POA: Diagnosis not present

## 2015-03-11 NOTE — Assessment & Plan Note (Signed)
Gastric biopsies negative.  S/p treatment.

## 2015-03-11 NOTE — Patient Instructions (Addendum)
Your H pylori has been treated There was evidence of gastric metaplasia, which are subtle changes often associated with inflammation.  Based on current scientific data gastric metaplasia is felt to be low risk for develops dysplasia (precancerous change).  There were no precancerous changes in any of the biopsies taken during your endoscopy. We are not currently performing subsequent or surveillance upper endoscopy for gastric metaplasia without dysplasia.  You should avoid known risk factors such as alcohol, tobacco, & too much salt in your diet. A diet high in fruits & vegetables may help help prevent this. Call if any problems or concerns

## 2015-03-11 NOTE — Progress Notes (Signed)
Primary Care Physician: Loistine Chance, MD Primary Gastroenterologist:  Dr Allen Norris  Chief Complaint  Patient presents with  . Results    Pathology from EGD    HPI: Dana Sparks is a 64 y.o. female here for follow up of EGD with Dr Allen Norris.   She had a small focal area of IM on gastric bx.  Negative h pylori after completing Pylera.  She feels great.  She sees hematology for chronic IDA.  Denies heartburn, indigestion, nausea, vomiting, dysphagia, odynophagia or anorexia. Denies constipation, diarrhea, rectal bleeding, melena or weight loss. Denies NSAIDs  Current Outpatient Prescriptions  Medication Sig Dispense Refill  . allopurinol (ZYLOPRIM) 100 MG tablet Take 100 mg by mouth daily.    Marland Kitchen amLODipine (NORVASC) 5 MG tablet Take 5 mg by mouth daily.    Marland Kitchen aspirin EC 81 MG tablet Take 81 mg by mouth daily.    . citalopram (CELEXA) 20 MG tablet Take 20 mg by mouth daily.    Marland Kitchen estradiol (ESTRACE) 0.5 MG tablet Take 0.5 mg by mouth daily.    . ferrous sulfate 325 (65 FE) MG tablet Take 325 mg by mouth daily with breakfast.    . fexofenadine (ALLEGRA) 180 MG tablet Take 180 mg by mouth daily.    . fluticasone (FLOVENT DISKUS) 50 MCG/BLIST diskus inhaler Inhale 1 puff into the lungs 2 (two) times daily.    Marland Kitchen glipiZIDE (GLUCOTROL) 5 MG tablet Take 1 tablet (5 mg total) by mouth 2 (two) times daily before a meal. 2 tabs in morning, 1 in evening 180 tablet 0  . isosorbide mononitrate (IMDUR) 60 MG 24 hr tablet Take 60 mg by mouth daily.    Marland Kitchen lisinopril (PRINIVIL,ZESTRIL) 40 MG tablet Take 40 mg by mouth 2 (two) times daily.    . metoprolol succinate (TOPROL-XL) 50 MG 24 hr tablet Take 1 tablet by mouth daily.    . pantoprazole (PROTONIX) 40 MG tablet Take 40 mg by mouth daily.    . potassium chloride (K-DUR,KLOR-CON) 10 MEQ tablet Take 10 mEq by mouth daily as needed.    . rosuvastatin (CRESTOR) 20 MG tablet Take 1 tablet (20 mg total) by mouth daily. 90 tablet 3  . sitaGLIPtin-metformin (JANUMET)  50-1000 MG per tablet Take 1 tablet by mouth 2 (two) times daily with a meal.    . torsemide (DEMADEX) 20 MG tablet Take 20 mg by mouth daily.     No current facility-administered medications for this visit.    Allergies as of 03/11/2015 - Review Complete 03/11/2015  Allergen Reaction Noted  . Codeine  12/31/2014  . Contrast media [iodinated diagnostic agents] Itching 02/16/2015    Review of Systems: Gen: Denies any fever, chills, fatigue, weakness, malaise ENT: Negative for hoarseness, difficulty swallowing , nasal congestion CV: Denies chest pain, angina, palpitations, syncope, orthopnea, PND, peripheral edema, and claudication. Resp: Denies dyspnea at rest, dyspnea with exercise, cough, sputum, wheezing, coughing up blood, and pleurisy. GI: See HPI GU:  Negative for dysuria, hematuria, urinary incontinence, urinary frequency, nocturnal urination.  Endo: Negative for unusual weight change or sweats Derm: Denies jaundice, rash, itching, or unhealing ulcers.  Psych: Denies depression, anxiety, memory loss, suicidal ideation, hallucinations, paranoia, and confusion. Heme: Denies bruising, bleeding, and enlarged lymph nodes.   Physical Examination:  BP 116/79 mmHg  Pulse 73  Temp(Src) 97.8 F (36.6 C) (Oral)  Ht 5' 5.5" (1.664 m)  Wt 212 lb (96.163 kg)  BMI 34.73 kg/m2 Body mass index is 34.73 kg/(m^2).  No LMP recorded. Patient has had a hysterectomy. General:   Alert,  Well-developed, obese, pleasant and cooperative in NAD Head:  Normocephalic and atraumatic. Eyes:  Sclera clear, no icterus.   Conjunctiva pink. Mouth:  No deformity or lesions.  Oropharynx pink & moist. Neck:  Supple; no masses or thyromegaly. Heart:  Regular rate and rhythm; no murmurs, clicks, rubs,  or gallops. Abdomen:   Normal bowel sounds.  Soft, nontender and nondistended. No masses, hepatosplenomegaly or hernias noted. No guarding or rebound tenderness.   Msk:  Symmetrical without gross deformities.  Normal posture. Pulses:  Normal pulses noted. Extremities:  Without clubbing or edema. Neurologic:  Alert and  oriented x3;  grossly normal neurologically. Skin:  Intact without significant lesions or rashes. Cervical Nodes:  No significant cervical adenopathy. Psych:  Alert and cooperative. Normal mood and affect.

## 2015-03-11 NOTE — Assessment & Plan Note (Signed)
Pt has discontinued meloxicam.  Quit ETOH & tobacco. There was evidence of gastric metaplasia, which are subtle changes often associated with inflammation.  Based on current scientific data gastric metaplasia is felt to be low risk for develops dysplasia (precancerous change).  There were no precancerous changes in any of the biopsies taken during endoscopy. We are not currently performing subsequent or surveillance upper endoscopy for gastric metaplasia without dysplasia.  You should avoid known risk factors such as alcohol, tobacco, & too much salt in your diet. A diet high in fruits & vegetables may help help prevent this.

## 2015-04-01 NOTE — Progress Notes (Signed)
[facility] day:  02/02/2015  Chief Complaint: Dana Sparks is a 64 y.o. female with anemia and thrombocytosis who is seen for 1 month assessment.  HPI: The patient was last seen in the medical oncology clinic on 12/31/2014.  At that time, initial work-up was reviewed.  Work-up confirmed iron deficiency.  Ferritin was 4 with a TIBC of 518 and an iron saturation of 16%.  We discussed iron rich foods and oral iron 1-2 times a day with orange juice.  She states that Dr. Ronnald Ramp treated her for H. pylori. Antibiotics were completed. She has a EGD scheduled for 02/16/2015.  Her diet is consisting of "liver out the wazoo". She is taking 2 iron pills a day. Her energy level is not really better. She doesn't try to push herself and cause shortness of breath. Weight is up 5 pounds.  She denies any melena or hematochezia.  Past Medical History  Diagnosis Date  . Diverticulitis   . Allergy   . Positive H. pylori test   . Renal insufficiency   . Gastroparesis   . Anxiety   . Sleep apnea   . COPD (chronic obstructive pulmonary disease)   . Gout   . Diabetes mellitus without complication   . Depression   . Arthritis   . Hypertension   . CAD (coronary artery disease)   . Hyperlipemia   . Thrombocytosis 01/28/2015  . Tubular adenoma of colon 01/20/14    Past Surgical History  Procedure Laterality Date  . Abdominal hysterectomy      total  . Total knee arthroplasty Right   . Cataract extraction    . Coronary angioplasty with stent placement    . Esophagogastroduodenoscopy N/A 02/16/2015    negative h pylori, focal gastic intestinal metaplasia  . Colonoscopy  01/2014    sigmoid diverticulosis. desc colon TA, hyperplastic polyp    Family History  Problem Relation Age of Onset  . Cancer Sister     unsure  . Alcohol abuse Brother   . Colon cancer Neg Hx   . Liver disease Neg Hx     Social History:  reports that she quit smoking about 2 years ago.  Her smoking use included Cigarettes. She has a 40 pack-year smoking history. She has never used smokeless tobacco. She reports that she does not drink alcohol or use illicit drugs.  The patient is alone today.  Allergies:  Allergies  Allergen Reactions  . Codeine   . Contrast Media [Iodinated Diagnostic Agents] Itching    Current Medications: Current Outpatient Prescriptions  Medication Sig Dispense Refill  . allopurinol (ZYLOPRIM) 100 MG tablet Take 100 mg by mouth daily.    Marland Kitchen amLODipine (NORVASC) 5 MG tablet Take 5 mg by mouth daily.    Marland Kitchen aspirin EC 81 MG tablet Take 81 mg by mouth daily.    . citalopram (CELEXA) 20 MG tablet Take 20 mg by mouth daily.    Marland Kitchen estradiol (ESTRACE) 0.5 MG tablet Take 0.5 mg by mouth daily.    . ferrous sulfate 325 (65 FE) MG tablet Take 325 mg by mouth daily with breakfast.    . fexofenadine (ALLEGRA) 180 MG tablet Take 180 mg by mouth daily.    . isosorbide mononitrate (IMDUR) 60 MG 24 hr tablet Take 60 mg by mouth daily.    Marland Kitchen lisinopril (PRINIVIL,ZESTRIL) 40 MG tablet Take 40 mg by mouth 2 (two) times daily.    . pantoprazole (PROTONIX) 40  MG tablet Take 40 mg by mouth daily.    . sitaGLIPtin-metformin (JANUMET) 50-1000 MG per tablet Take 1 tablet by mouth 2 (two) times daily with a meal.    . torsemide (DEMADEX) 20 MG tablet Take 20 mg by mouth daily.    . fluticasone (FLOVENT DISKUS) 50 MCG/BLIST diskus inhaler Inhale 1 puff into the lungs 2 (two) times daily.    Marland Kitchen glipiZIDE (GLUCOTROL) 5 MG tablet Take 1 tablet (5 mg total) by mouth 2 (two) times daily before a meal. 2 tabs in morning, 1 in evening 180 tablet 0  . metoprolol succinate (TOPROL-XL) 50 MG 24 hr tablet Take 1 tablet by mouth daily.    . potassium chloride (K-DUR,KLOR-CON) 10 MEQ tablet Take 10 mEq by mouth daily as needed.    . rosuvastatin (CRESTOR) 20 MG tablet Take 1 tablet (20 mg total) by mouth daily. 90 tablet 3   No current facility-administered medications for this visit.     Review of Systems:  GENERAL:  Energy not much better.  No fevers, sweats.  Weight gain of 5 pounds. PERFORMANCE STATUS (ECOG):  1 HEENT:  No visual changes, runny nose, sore throat, mouth sores or tenderness. Lungs:  Shortness of breath with exertion.  No cough.  No hemoptysis. Cardiac:  No chest pain, palpitations, orthopnea, or PND. GI:  No nausea, vomiting, diarrhea, constipation, melena or hematochezia. GU:  No urgency, frequency, dysuria, or hematuria. Musculoskeletal:  No back pain.  No joint pain.  No muscle tenderness. Extremities:  No pain or swelling. Skin:  No rashes or skin changes. Neuro:  No headache, numbness or weakness, balance or coordination issues. Endocrine:  No diabetes, thyroid issues, hot flashes or night sweats. Psych:  No mood changes, depression or anxiety. Pain:  No focal pain. Review of systems:  All other systems reviewed and found to be negative.   Physical Exam: Blood pressure 114/74, pulse 80, temperature 97.9 F (36.6 C), temperature source Tympanic, height 5' 5.5" (1.664 m), weight 213 lb 3 oz (96.7 kg). GENERAL:  Well developed, well nourished, sitting comfortably in the exam room in no acute distress. MENTAL STATUS:  Alert and oriented to person, place and time. HEAD:  Normocephalic, atraumatic, face symmetric, no Cushingoid features. EYES:  Brown eyes.  Pupils equal round and reactive to light and accomodation.  No conjunctivitis or scleral icterus. ENT:  Oropharynx clear without lesion.  Tongue normal. Mucous membranes moist.  RESPIRATORY:  Clear to auscultation without rales, wheezes or rhonchi. CARDIOVASCULAR:  Regular rate and rhythm without murmur, rub or gallop. ABDOMEN:  Soft, non-tender, with active bowel sounds, and no hepatosplenomegaly.  No masses. SKIN:  No rashes, ulcers or lesions. EXTREMITIES: No edema, no skin discoloration or tenderness.  No palpable cords. LYMPH NODES: No palpable cervical, supraclavicular, axillary or  inguinal adenopathy  NEUROLOGICAL: Unremarkable. PSYCH:  Appropriate.  Appointment on 02/02/2015  Component Date Value Ref Range Status  . WBC 02/02/2015 6.7  3.6 - 11.0 K/uL Final  . RBC 02/02/2015 4.37  3.80 - 5.20 MIL/uL Final  . Hemoglobin 02/02/2015 10.3* 12.0 - 16.0 g/dL Final  . HCT 02/02/2015 33.7* 35.0 - 47.0 % Final  . MCV 02/02/2015 77.1* 80.0 - 100.0 fL Final  . MCH 02/02/2015 23.4* 26.0 - 34.0 pg Final  . MCHC 02/02/2015 30.4* 32.0 - 36.0 g/dL Final  . RDW 02/02/2015 21.2* 11.5 - 14.5 % Final  . Platelets 02/02/2015 350  150 - 440 K/uL Final  . Neutrophils Relative % 02/02/2015 43  Final  . Neutro Abs 02/02/2015 2.9  1.4 - 6.5 K/uL Final  . Lymphocytes Relative 02/02/2015 46   Final  . Lymphs Abs 02/02/2015 3.1  1.0 - 3.6 K/uL Final  . Monocytes Relative 02/02/2015 7   Final  . Monocytes Absolute 02/02/2015 0.5  0.2 - 0.9 K/uL Final  . Eosinophils Relative 02/02/2015 3   Final  . Eosinophils Absolute 02/02/2015 0.2  0 - 0.7 K/uL Final  . Basophils Relative 02/02/2015 1   Final  . Basophils Absolute 02/02/2015 0.0  0 - 0.1 K/uL Final  . Ferritin 02/02/2015 8* 11 - 307 ng/mL Final  . Iron 02/02/2015 179* 28 - 170 ug/dL Final  . TIBC 02/02/2015 413  250 - 450 ug/dL Final  . Saturation Ratios 02/02/2015 43* 10.4 - 31.8 % Final  . UIBC 02/02/2015 234   Final  . Sed Rate 02/02/2015 18  0 - 30 mm/hr Final    Assessment:  Dana Sparks is a 64 y.o. female with anemia and thrombocytosis.  Outside labs are consistent with iron deficiency anemia and reactive thrombocytosis.  She has positive H pylori serologies and breath test.  She did not take her prescribed medications.  Her diet is good.  She denies any GI bleeding (melena or hematochezia), vaginal or GU bleeding. Colonoscopy on 01/20/2014 and revealed a 6 mm polyp in the descending colon, a 5 mm polyp in the sigmoid colon, and diverticulosis.  Pathology was benign. She has never had an EGD.  Work-up on 12/22/2014 confirmed  iron deficiency.  Labs included a hematocrit 29.5, hemoglobin 8.8, MCV 74, platelets 428,000 and white count 6800 with an ANC of 3332. Ferritin was 4 (low) with a TIBC of 518 (elevated). Iron saturation was 3%. Folate was 23.3. Guaiac cards were negative. Diet is high in iron rich foods.  She is on oral iron 1-2 tablets a day.  Symptomatically, she continues to have mild fatigue.  Exam is stable.  Ferritin is 8.  Plan: 1. Labs today:  CBC with diff, ferritin, iron studies, ESR. 2. Continue oral iron.  Ferritin goal 100-400. 3. Follow-up with GI for upper endoscopy on 02/16/2015. 4. RTC in 2 months for MD assessment and labs (CBC with diff, ferritin).   Lequita Asal, MD  02/02/2015

## 2015-04-02 ENCOUNTER — Other Ambulatory Visit: Payer: Self-pay

## 2015-04-02 ENCOUNTER — Inpatient Hospital Stay: Payer: Commercial Managed Care - HMO | Attending: Hematology and Oncology | Admitting: Hematology and Oncology

## 2015-04-02 ENCOUNTER — Inpatient Hospital Stay: Payer: Commercial Managed Care - HMO

## 2015-04-02 VITALS — BP 128/76 | HR 81 | Temp 95.2°F | Ht 65.0 in | Wt 213.4 lb

## 2015-04-02 DIAGNOSIS — K3184 Gastroparesis: Secondary | ICD-10-CM | POA: Insufficient documentation

## 2015-04-02 DIAGNOSIS — J449 Chronic obstructive pulmonary disease, unspecified: Secondary | ICD-10-CM | POA: Insufficient documentation

## 2015-04-02 DIAGNOSIS — D75839 Thrombocytosis, unspecified: Secondary | ICD-10-CM

## 2015-04-02 DIAGNOSIS — F419 Anxiety disorder, unspecified: Secondary | ICD-10-CM | POA: Diagnosis not present

## 2015-04-02 DIAGNOSIS — D509 Iron deficiency anemia, unspecified: Secondary | ICD-10-CM | POA: Diagnosis not present

## 2015-04-02 DIAGNOSIS — D125 Benign neoplasm of sigmoid colon: Secondary | ICD-10-CM

## 2015-04-02 DIAGNOSIS — K5790 Diverticulosis of intestine, part unspecified, without perforation or abscess without bleeding: Secondary | ICD-10-CM | POA: Insufficient documentation

## 2015-04-02 DIAGNOSIS — E119 Type 2 diabetes mellitus without complications: Secondary | ICD-10-CM | POA: Insufficient documentation

## 2015-04-02 DIAGNOSIS — Z7982 Long term (current) use of aspirin: Secondary | ICD-10-CM | POA: Diagnosis not present

## 2015-04-02 DIAGNOSIS — D473 Essential (hemorrhagic) thrombocythemia: Secondary | ICD-10-CM

## 2015-04-02 DIAGNOSIS — Z800 Family history of malignant neoplasm of digestive organs: Secondary | ICD-10-CM | POA: Diagnosis not present

## 2015-04-02 DIAGNOSIS — I10 Essential (primary) hypertension: Secondary | ICD-10-CM | POA: Insufficient documentation

## 2015-04-02 DIAGNOSIS — I2510 Atherosclerotic heart disease of native coronary artery without angina pectoris: Secondary | ICD-10-CM | POA: Diagnosis not present

## 2015-04-02 DIAGNOSIS — E785 Hyperlipidemia, unspecified: Secondary | ICD-10-CM | POA: Diagnosis not present

## 2015-04-02 DIAGNOSIS — F329 Major depressive disorder, single episode, unspecified: Secondary | ICD-10-CM | POA: Insufficient documentation

## 2015-04-02 DIAGNOSIS — M129 Arthropathy, unspecified: Secondary | ICD-10-CM | POA: Insufficient documentation

## 2015-04-02 DIAGNOSIS — Z809 Family history of malignant neoplasm, unspecified: Secondary | ICD-10-CM | POA: Diagnosis not present

## 2015-04-02 DIAGNOSIS — M109 Gout, unspecified: Secondary | ICD-10-CM | POA: Diagnosis not present

## 2015-04-02 DIAGNOSIS — Z79899 Other long term (current) drug therapy: Secondary | ICD-10-CM | POA: Diagnosis not present

## 2015-04-02 DIAGNOSIS — D649 Anemia, unspecified: Secondary | ICD-10-CM

## 2015-04-02 DIAGNOSIS — G4730 Sleep apnea, unspecified: Secondary | ICD-10-CM | POA: Diagnosis not present

## 2015-04-02 DIAGNOSIS — D124 Benign neoplasm of descending colon: Secondary | ICD-10-CM | POA: Insufficient documentation

## 2015-04-02 LAB — CBC WITH DIFFERENTIAL/PLATELET
Basophils Absolute: 0 10*3/uL (ref 0–0.1)
Basophils Relative: 1 %
Eosinophils Absolute: 0.1 10*3/uL (ref 0–0.7)
Eosinophils Relative: 2 %
HCT: 40.5 % (ref 35.0–47.0)
Hemoglobin: 12.7 g/dL (ref 12.0–16.0)
Lymphocytes Relative: 45 %
Lymphs Abs: 3.3 10*3/uL (ref 1.0–3.6)
MCH: 26.6 pg (ref 26.0–34.0)
MCHC: 31.3 g/dL — ABNORMAL LOW (ref 32.0–36.0)
MCV: 84.9 fL (ref 80.0–100.0)
Monocytes Absolute: 0.5 10*3/uL (ref 0.2–0.9)
Monocytes Relative: 7 %
Neutro Abs: 3.3 10*3/uL (ref 1.4–6.5)
Neutrophils Relative %: 45 %
Platelets: 288 10*3/uL (ref 150–440)
RBC: 4.78 MIL/uL (ref 3.80–5.20)
RDW: 22.7 % — ABNORMAL HIGH (ref 11.5–14.5)
WBC: 7.3 10*3/uL (ref 3.6–11.0)

## 2015-04-02 LAB — FERRITIN: Ferritin: 11 ng/mL (ref 11–307)

## 2015-04-02 NOTE — Progress Notes (Signed)
[facility] day:  04/02/2015  Chief Complaint: Dana Sparks is a 64 y.o. female with iron deficiency anemia and reactive thrombocytosis who is seen for 2 month assessment.  HPI: The patient was last seen in the medical oncology clinic on 02/02/2015.  At that time, she had completed her antibiotics for H. pylori. He was eating a diet heavy and rich foods all is taking 2 iron pills a day. CBC included a hematocrit of 33.7, hemoglobin 10.3, MCV 77.1 and platelets 350,000. Ferritin was 8.  The patient underwent upper endoscopy by Dr. Lucilla Lame on 02/16/2015.  Exam was normal. Gastric biopsies revealed no active inflammation, dysplasia or malignancy. There was no H. Pylori.  During the interim, the patient states that she feels fine. She is eating good including lots of iron rich foods. Stools are dark iron. She is taking iron pills with orange juice or vitamin C twice a day. Her energy level is a little bit better. She denies any pica.  Past Medical History  Diagnosis Date  . Diverticulitis   . Allergy   . Positive H. pylori test   . Renal insufficiency   . Gastroparesis   . Anxiety   . Sleep apnea   . COPD (chronic obstructive pulmonary disease)   . Gout   . Diabetes mellitus without complication   . Depression   . Arthritis   . Hypertension   . CAD (coronary artery disease)   . Hyperlipemia   . Thrombocytosis 01/28/2015  . Tubular adenoma of colon 01/20/14    Past Surgical History  Procedure Laterality Date  . Abdominal hysterectomy      total  . Total knee arthroplasty Right   . Cataract extraction    . Coronary angioplasty with stent placement    . Esophagogastroduodenoscopy N/A 02/16/2015    negative h pylori, focal gastic intestinal metaplasia  . Colonoscopy  01/2014    sigmoid diverticulosis. desc colon TA, hyperplastic polyp    Family History  Problem Relation Age of Onset  . Cancer Sister     unsure  . Alcohol abuse  Brother   . Colon cancer Neg Hx   . Liver disease Neg Hx     Social History:  reports that she quit smoking about 2 years ago. Her smoking use included Cigarettes. She has a 40 pack-year smoking history. She has never used smokeless tobacco. She reports that she does not drink alcohol or use illicit drugs.  The patient is alone today.  Allergies:  Allergies  Allergen Reactions  . Codeine   . Contrast Media [Iodinated Diagnostic Agents] Itching    Current Medications: Current Outpatient Prescriptions  Medication Sig Dispense Refill  . allopurinol (ZYLOPRIM) 100 MG tablet Take 100 mg by mouth daily.    Marland Kitchen amLODipine (NORVASC) 5 MG tablet Take 5 mg by mouth daily.    Marland Kitchen aspirin EC 81 MG tablet Take 81 mg by mouth daily.    . citalopram (CELEXA) 20 MG tablet Take 20 mg by mouth daily.    Marland Kitchen estradiol (ESTRACE) 0.5 MG tablet Take 0.5 mg by mouth daily.    . ferrous sulfate 325 (65 FE) MG tablet Take 325 mg by mouth daily with breakfast.    . fexofenadine (ALLEGRA) 180 MG tablet Take 180 mg by mouth daily.    . fluticasone (FLOVENT DISKUS) 50 MCG/BLIST diskus inhaler Inhale 1 puff into the lungs 2 (two) times daily.    Marland Kitchen glipiZIDE (GLUCOTROL)  5 MG tablet Take 1 tablet (5 mg total) by mouth 2 (two) times daily before a meal. 2 tabs in morning, 1 in evening 180 tablet 0  . isosorbide mononitrate (IMDUR) 60 MG 24 hr tablet Take 60 mg by mouth daily.    Marland Kitchen lisinopril (PRINIVIL,ZESTRIL) 40 MG tablet Take 40 mg by mouth 2 (two) times daily.    . metoprolol succinate (TOPROL-XL) 50 MG 24 hr tablet Take 1 tablet by mouth daily.    . pantoprazole (PROTONIX) 40 MG tablet Take 40 mg by mouth daily.    . rosuvastatin (CRESTOR) 20 MG tablet Take 1 tablet (20 mg total) by mouth daily. 90 tablet 3  . sitaGLIPtin-metformin (JANUMET) 50-1000 MG per tablet Take 1 tablet by mouth 2 (two) times daily with a meal.    . torsemide (DEMADEX) 20 MG tablet Take 20 mg by mouth daily.    . potassium chloride  (K-DUR,KLOR-CON) 10 MEQ tablet Take 10 mEq by mouth daily as needed.     No current facility-administered medications for this visit.    Review of Systems:  GENERAL:  Feels fine.  No fevers, sweats or weight loss. PERFORMANCE STATUS (ECOG):  1 HEENT:  No visual changes, runny nose, sore throat, mouth sores or tenderness. Lungs: No shortness of breath or cough.  No hemoptysis. Cardiac:  No chest pain, palpitations, orthopnea, or PND. GI:  Stools dark on oral iron.  No nausea, vomiting, diarrhea, constipation, melena or hematochezia. GU:  No urgency, frequency, dysuria, or hematuria. Musculoskeletal:  No back pain.  No joint pain.  No muscle tenderness. Extremities:  No pain or swelling. Skin:  No rashes or skin changes. Neuro:  No headache, numbness or weakness, balance or coordination issues. Endocrine:  No diabetes, thyroid issues, hot flashes or night sweats. Psych:  No mood changes, depression or anxiety. Pain:  No focal pain. Review of systems:  All other systems reviewed and found to be negative.  Physical Exam: Blood pressure 128/76, pulse 81, temperature 95.2 F (35.1 C), temperature source Tympanic, height 5\' 5"  (1.651 m), weight 213 lb 6.5 oz (96.8 kg). GENERAL:  Well developed, well nourished, sitting comfortably in the exam room in no acute distress. MENTAL STATUS:  Alert and oriented to person, place and time. HEAD:  Curly brown hair.  Normocephalic, atraumatic, face symmetric, no Cushingoid features. EYES:  Glasses.  Brown eyes.  Pupils equal round and reactive to light and accomodation.  No conjunctivitis or scleral icterus. ENT:  Oropharynx clear without lesion.  Tongue normal. Mucous membranes moist.  RESPIRATORY:  Clear to auscultation without rales, wheezes or rhonchi. CARDIOVASCULAR:  Regular rate and rhythm without murmur, rub or gallop. ABDOMEN:  Soft, non-tender, with active bowel sounds, and no hepatosplenomegaly.  No masses. SKIN:  No rashes, ulcers or  lesions. EXTREMITIES: No edema, no skin discoloration or tenderness.  No palpable cords. LYMPH NODES: No palpable cervical, supraclavicular, axillary or inguinal adenopathy  NEUROLOGICAL: Unremarkable. PSYCH:  Appropriate.  Appointment on 04/02/2015  Component Date Value Ref Range Status  . WBC 04/02/2015 7.3  3.6 - 11.0 K/uL Final  . RBC 04/02/2015 4.78  3.80 - 5.20 MIL/uL Final  . Hemoglobin 04/02/2015 12.7  12.0 - 16.0 g/dL Final  . HCT 04/02/2015 40.5  35.0 - 47.0 % Final  . MCV 04/02/2015 84.9  80.0 - 100.0 fL Final  . MCH 04/02/2015 26.6  26.0 - 34.0 pg Final  . MCHC 04/02/2015 31.3* 32.0 - 36.0 g/dL Final  . RDW 04/02/2015 22.7*  11.5 - 14.5 % Final  . Platelets 04/02/2015 288  150 - 440 K/uL Final  . Neutrophils Relative % 04/02/2015 45   Final  . Neutro Abs 04/02/2015 3.3  1.4 - 6.5 K/uL Final  . Lymphocytes Relative 04/02/2015 45   Final  . Lymphs Abs 04/02/2015 3.3  1.0 - 3.6 K/uL Final  . Monocytes Relative 04/02/2015 7   Final  . Monocytes Absolute 04/02/2015 0.5  0.2 - 0.9 K/uL Final  . Eosinophils Relative 04/02/2015 2   Final  . Eosinophils Absolute 04/02/2015 0.1  0 - 0.7 K/uL Final  . Basophils Relative 04/02/2015 1   Final  . Basophils Absolute 04/02/2015 0.0  0 - 0.1 K/uL Final  . Ferritin 04/02/2015 11  11 - 307 ng/mL Final    Assessment:  Dana Sparks is a 64 y.o. female with anemia and thrombocytosis.  Outside labs are consistent with iron deficiency anemia and reactive thrombocytosis.  She has positive H pylori serologies and breath test.  She did not take her prescribed medications.  Her diet is good.  She denies any GI bleeding (melena or hematochezia), vaginal or GU bleeding. Colonoscopy on 01/20/2014 and revealed a 6 mm polyp in the descending colon, a 5 mm polyp in the sigmoid colon, and diverticulosis.  Pathology was benign.  Upper endoscopy on 02/16/2015 was normal.  Work-up on 12/22/2014 confirmed iron deficiency.  Labs included a hematocrit 29.5,  hemoglobin 8.8, MCV 74, platelets 428,000 and white count 6800 with an ANC of 3332. Ferritin was 4 (low) with a TIBC of 518 (elevated). Iron saturation was 3%. Folate was 23.3. Guaiac cards were negative.   Diet is high in iron rich foods.  She is taking oral iron 2 pills a day with orange juice or vitamin C.  Symptomatically, she feels good.  Exam in unremarkable.  Ferritin is 11. Plan: 1.  Labs today:  CBC witth diff, ferritin. 2.  Continue oral iron. Ferritin goal 100-400. 3.  RTC in 1 month for CBC with diff, ferritin. 4.  RTC in 3 months for MD assessment and labs (CBC with diff, ferritin).   Lequita Asal, MD  04/02/2015, 11:25 PM

## 2015-04-02 NOTE — Progress Notes (Signed)
Pt here today for follow up regarding thrombocytosis; offers no complaints today

## 2015-04-06 ENCOUNTER — Telehealth: Payer: Self-pay | Admitting: *Deleted

## 2015-04-06 NOTE — Telephone Encounter (Signed)
Results for Dana Sparks, Dana Sparks (MRN [id number]) as of 04/06/2015 17:16  Ref. Range 04/02/2015 13:24  Ferritin Latest Ref Range: 11-307 ng/mL 11

## 2015-04-07 DIAGNOSIS — D509 Iron deficiency anemia, unspecified: Secondary | ICD-10-CM | POA: Insufficient documentation

## 2015-04-07 NOTE — Telephone Encounter (Signed)
Order for schedulers placed for lab appt . Patient not answering phone

## 2015-04-07 NOTE — Telephone Encounter (Signed)
  Patient's hematocrit has gone from 29 to 33 to 40.  Her ferritin has gone from 4 to 8 to 11.  I believe she is just on oral iron.  If so, would continue until ferritin >= 100.  Let's check labs again in a month.   Order in.  M

## 2015-04-21 ENCOUNTER — Other Ambulatory Visit: Payer: Self-pay | Admitting: Family Medicine

## 2015-04-22 NOTE — Telephone Encounter (Signed)
Patient requesting refill. 

## 2015-05-03 ENCOUNTER — Inpatient Hospital Stay: Payer: Commercial Managed Care - HMO | Attending: Hematology and Oncology

## 2015-05-28 ENCOUNTER — Other Ambulatory Visit: Payer: Self-pay | Admitting: Family Medicine

## 2015-05-28 ENCOUNTER — Telehealth: Payer: Self-pay | Admitting: Family Medicine

## 2015-05-28 NOTE — Telephone Encounter (Signed)
I can change to Metformin until she can afford medication again, or contact the manufacture and she may qualify for getting medications for free until she is out of donut hole.  I think she will pay more if she gets only 30 pill through [facility], should try local pharmacy instead

## 2015-05-28 NOTE — Telephone Encounter (Signed)
Called patient but she does not have a vm set up to leave a message.

## 2015-05-28 NOTE — Telephone Encounter (Signed)
Patient is in the [facility] again and is requesting a 30 day supply to be sent to [facility]. She have 1 week worth of medication left. Next appointment is scheduled for Oct. 24

## 2015-05-31 NOTE — Telephone Encounter (Signed)
Patient states she has already made a appointment at [facility] for next week, due to them helping patient last year receive her medication and will bring [place] forms soon.

## 2015-06-14 ENCOUNTER — Other Ambulatory Visit: Payer: Self-pay | Admitting: Family Medicine

## 2015-06-14 ENCOUNTER — Telehealth: Payer: Self-pay | Admitting: Family Medicine

## 2015-06-14 ENCOUNTER — Ambulatory Visit: Payer: Commercial Managed Care - HMO

## 2015-06-14 NOTE — Telephone Encounter (Signed)
Patient requesting refill. 

## 2015-06-14 NOTE — Telephone Encounter (Signed)
Betty from medication management called on behalf of this pt and would like a call back.

## 2015-06-17 ENCOUNTER — Other Ambulatory Visit: Payer: Self-pay

## 2015-06-17 DIAGNOSIS — E1143 Type 2 diabetes mellitus with diabetic autonomic (poly)neuropathy: Secondary | ICD-10-CM

## 2015-06-17 DIAGNOSIS — K3184 Gastroparesis: Secondary | ICD-10-CM

## 2015-06-17 DIAGNOSIS — E785 Hyperlipidemia, unspecified: Secondary | ICD-10-CM

## 2015-06-17 MED ORDER — PANTOPRAZOLE SODIUM 40 MG PO TBEC: 40.0000 mg | DELAYED_RELEASE_TABLET | Freq: Every morning | ORAL | Status: DC

## 2015-06-17 MED ORDER — POTASSIUM CHLORIDE CRYS ER 10 MEQ PO TBCR: 10.0000 meq | EXTENDED_RELEASE_TABLET | Freq: Every day | ORAL | Status: DC | PRN

## 2015-06-17 MED ORDER — ISOSORBIDE MONONITRATE ER 60 MG PO TB24: 60.0000 mg | ORAL_TABLET | Freq: Every day | ORAL | Status: DC

## 2015-06-17 MED ORDER — SITAGLIPTIN PHOS-METFORMIN HCL 50-1000 MG PO TABS: 1.0000 | ORAL_TABLET | Freq: Two times a day (BID) | ORAL | Status: DC

## 2015-06-17 MED ORDER — LISINOPRIL 40 MG PO TABS: 40.0000 mg | ORAL_TABLET | Freq: Two times a day (BID) | ORAL | Status: DC

## 2015-06-17 MED ORDER — ALLOPURINOL 100 MG PO TABS: 100.0000 mg | ORAL_TABLET | Freq: Every day | ORAL | Status: DC

## 2015-06-17 MED ORDER — FLUTICASONE PROPIONATE (INHAL) 50 MCG/BLIST IN AEPB: 1.0000 | INHALATION_SPRAY | Freq: Two times a day (BID) | RESPIRATORY_TRACT | Status: DC

## 2015-06-17 MED ORDER — GLIPIZIDE 5 MG PO TABS: 5.0000 mg | ORAL_TABLET | Freq: Two times a day (BID) | ORAL | Status: DC

## 2015-06-17 MED ORDER — FERROUS SULFATE 325 (65 FE) MG PO TABS: 325.0000 mg | ORAL_TABLET | Freq: Every day | ORAL | Status: DC

## 2015-06-17 MED ORDER — METOPROLOL SUCCINATE ER 50 MG PO TB24: 50.0000 mg | ORAL_TABLET | Freq: Every day | ORAL | Status: DC

## 2015-06-17 MED ORDER — TORSEMIDE 20 MG PO TABS: 20.0000 mg | ORAL_TABLET | Freq: Every day | ORAL | Status: DC

## 2015-06-17 MED ORDER — ROSUVASTATIN CALCIUM 20 MG PO TABS: 20.0000 mg | ORAL_TABLET | Freq: Every day | ORAL | Status: DC

## 2015-06-17 MED ORDER — ESTRADIOL 0.5 MG PO TABS: 0.5000 mg | ORAL_TABLET | Freq: Every day | ORAL | Status: DC

## 2015-06-17 MED ORDER — CITALOPRAM HYDROBROMIDE 20 MG PO TABS: 20.0000 mg | ORAL_TABLET | Freq: Every day | ORAL | Status: DC

## 2015-06-17 NOTE — Telephone Encounter (Signed)
Have left 2 voice mails with no return call back

## 2015-06-17 NOTE — Telephone Encounter (Signed)
Please print these all out so we can send to medication management so they can help her wit her rx's

## 2015-06-18 ENCOUNTER — Other Ambulatory Visit: Payer: Self-pay

## 2015-06-18 DIAGNOSIS — K3184 Gastroparesis: Principal | ICD-10-CM

## 2015-06-18 DIAGNOSIS — E785 Hyperlipidemia, unspecified: Secondary | ICD-10-CM

## 2015-06-18 DIAGNOSIS — E1143 Type 2 diabetes mellitus with diabetic autonomic (poly)neuropathy: Secondary | ICD-10-CM

## 2015-06-18 MED ORDER — CITALOPRAM HYDROBROMIDE 20 MG PO TABS: 20.0000 mg | ORAL_TABLET | Freq: Every day | ORAL | Status: DC

## 2015-06-18 MED ORDER — PANTOPRAZOLE SODIUM 40 MG PO TBEC: 40.0000 mg | DELAYED_RELEASE_TABLET | Freq: Every morning | ORAL | Status: DC

## 2015-06-18 MED ORDER — SITAGLIPTIN PHOS-METFORMIN HCL 50-1000 MG PO TABS: 1.0000 | ORAL_TABLET | Freq: Two times a day (BID) | ORAL | Status: DC

## 2015-06-18 MED ORDER — ESTRADIOL 0.5 MG PO TABS: 0.5000 mg | ORAL_TABLET | Freq: Every day | ORAL | Status: DC

## 2015-06-18 MED ORDER — TORSEMIDE 20 MG PO TABS: 20.0000 mg | ORAL_TABLET | Freq: Every day | ORAL | Status: DC

## 2015-06-18 MED ORDER — ALLOPURINOL 100 MG PO TABS: 100.0000 mg | ORAL_TABLET | Freq: Every day | ORAL | Status: DC

## 2015-06-18 MED ORDER — FERROUS SULFATE 325 (65 FE) MG PO TABS: 325.0000 mg | ORAL_TABLET | Freq: Every day | ORAL | Status: DC

## 2015-06-18 MED ORDER — METOPROLOL SUCCINATE ER 50 MG PO TB24: 50.0000 mg | ORAL_TABLET | Freq: Every day | ORAL | Status: DC

## 2015-06-18 MED ORDER — LISINOPRIL 40 MG PO TABS: 40.0000 mg | ORAL_TABLET | Freq: Two times a day (BID) | ORAL | Status: DC

## 2015-06-18 MED ORDER — FLUTICASONE PROPIONATE (INHAL) 50 MCG/BLIST IN AEPB: 1.0000 | INHALATION_SPRAY | Freq: Two times a day (BID) | RESPIRATORY_TRACT | Status: DC

## 2015-06-18 MED ORDER — AMLODIPINE BESYLATE 5 MG PO TABS: 5.0000 mg | ORAL_TABLET | Freq: Every day | ORAL | Status: DC

## 2015-06-18 MED ORDER — ISOSORBIDE MONONITRATE ER 60 MG PO TB24: 60.0000 mg | ORAL_TABLET | Freq: Every day | ORAL | Status: DC

## 2015-06-18 MED ORDER — GLIPIZIDE 5 MG PO TABS: 5.0000 mg | ORAL_TABLET | Freq: Two times a day (BID) | ORAL | Status: DC

## 2015-06-18 MED ORDER — PANTOPRAZOLE SODIUM 40 MG PO TBEC
40.0000 mg | DELAYED_RELEASE_TABLET | Freq: Every morning | ORAL | Status: DC
Start: 2015-06-18 — End: 2015-06-18

## 2015-06-18 MED ORDER — POTASSIUM CHLORIDE CRYS ER 10 MEQ PO TBCR: 10.0000 meq | EXTENDED_RELEASE_TABLET | Freq: Every day | ORAL | Status: DC | PRN

## 2015-06-18 MED ORDER — FERROUS SULFATE 325 (65 FE) MG PO TABS
325.0000 mg | ORAL_TABLET | Freq: Every day | ORAL | Status: DC
Start: 2015-06-18 — End: 2016-05-02

## 2015-06-18 MED ORDER — ROSUVASTATIN CALCIUM 20 MG PO TABS: 20.0000 mg | ORAL_TABLET | Freq: Every day | ORAL | Status: DC

## 2015-06-18 NOTE — Telephone Encounter (Signed)
Opened in error

## 2015-06-21 DIAGNOSIS — L851 Acquired keratosis [keratoderma] palmaris et plantaris: Secondary | ICD-10-CM | POA: Diagnosis not present

## 2015-06-21 DIAGNOSIS — B351 Tinea unguium: Secondary | ICD-10-CM | POA: Diagnosis not present

## 2015-06-21 DIAGNOSIS — E1142 Type 2 diabetes mellitus with diabetic polyneuropathy: Secondary | ICD-10-CM | POA: Diagnosis not present

## 2015-06-21 DIAGNOSIS — R7989 Other specified abnormal findings of blood chemistry: Secondary | ICD-10-CM | POA: Diagnosis not present

## 2015-06-22 ENCOUNTER — Encounter: Payer: Self-pay | Admitting: Family Medicine

## 2015-06-22 DIAGNOSIS — E1143 Type 2 diabetes mellitus with diabetic autonomic (poly)neuropathy: Secondary | ICD-10-CM | POA: Insufficient documentation

## 2015-06-22 DIAGNOSIS — R7989 Other specified abnormal findings of blood chemistry: Secondary | ICD-10-CM | POA: Insufficient documentation

## 2015-06-22 LAB — TSH: TSH: 0.402 u[IU]/mL — AB (ref 0.450–4.500)

## 2015-06-28 ENCOUNTER — Ambulatory Visit (INDEPENDENT_AMBULATORY_CARE_PROVIDER_SITE_OTHER): Payer: Commercial Managed Care - HMO | Admitting: Family Medicine

## 2015-06-28 ENCOUNTER — Encounter: Payer: Self-pay | Admitting: Family Medicine

## 2015-06-28 VITALS — BP 118/78 | HR 82 | Temp 97.4°F | Resp 14 | Ht 60.0 in | Wt 214.8 lb

## 2015-06-28 DIAGNOSIS — E1143 Type 2 diabetes mellitus with diabetic autonomic (poly)neuropathy: Secondary | ICD-10-CM

## 2015-06-28 DIAGNOSIS — Z23 Encounter for immunization: Secondary | ICD-10-CM | POA: Diagnosis not present

## 2015-06-28 DIAGNOSIS — E1122 Type 2 diabetes mellitus with diabetic chronic kidney disease: Secondary | ICD-10-CM | POA: Diagnosis not present

## 2015-06-28 DIAGNOSIS — D473 Essential (hemorrhagic) thrombocythemia: Secondary | ICD-10-CM

## 2015-06-28 DIAGNOSIS — I10 Essential (primary) hypertension: Secondary | ICD-10-CM

## 2015-06-28 DIAGNOSIS — G4730 Sleep apnea, unspecified: Secondary | ICD-10-CM | POA: Diagnosis not present

## 2015-06-28 DIAGNOSIS — R7989 Other specified abnormal findings of blood chemistry: Secondary | ICD-10-CM

## 2015-06-28 DIAGNOSIS — N183 Chronic kidney disease, stage 3 unspecified: Secondary | ICD-10-CM

## 2015-06-28 DIAGNOSIS — R946 Abnormal results of thyroid function studies: Secondary | ICD-10-CM

## 2015-06-28 DIAGNOSIS — D75839 Thrombocytosis, unspecified: Secondary | ICD-10-CM

## 2015-06-28 DIAGNOSIS — F330 Major depressive disorder, recurrent, mild: Secondary | ICD-10-CM

## 2015-06-28 DIAGNOSIS — Z780 Asymptomatic menopausal state: Secondary | ICD-10-CM

## 2015-06-28 DIAGNOSIS — D509 Iron deficiency anemia, unspecified: Secondary | ICD-10-CM

## 2015-06-28 DIAGNOSIS — E785 Hyperlipidemia, unspecified: Secondary | ICD-10-CM | POA: Diagnosis not present

## 2015-06-28 DIAGNOSIS — J069 Acute upper respiratory infection, unspecified: Secondary | ICD-10-CM

## 2015-06-28 LAB — POCT UA - MICROALBUMIN: MICROALBUMIN (UR) POC: 20 mg/L

## 2015-06-28 LAB — POCT GLYCOSYLATED HEMOGLOBIN (HGB A1C): HEMOGLOBIN A1C: 6.4

## 2015-06-28 NOTE — Progress Notes (Signed)
Name: Dana Sparks   MRN: [id number]    DOB: Mar 26, 1951   Date:06/28/2015       Progress Note  Subjective  Chief Complaint  Chief Complaint  Patient presents with  . Medication Refill    follow-up  . Diabetes    not checking sugar  . Hypertension  . Hyperlipidemia  . Gastroesophageal Reflux  . Gout  . Allergic Rhinitis     scratchy throat onset 2 days    HPI  Anemia: : she was diagnosed earlier this year with h. Pylori, she was having a lot of dyspepsia. We tried treating her with antibiotics but she was unable to tolerate it. She was referred to Dr. Durwin Reges and Oncologist because of severe anemia. Notes not available for review. EGD was neg, she is taking ferrous sulfate twice daily and she states she has been compliant. No longer has epigastric pain. She has follow up with Hematologist at the end of this month.   DMII with gastroparesis: she is taking medication as prescribed,has not been checking glucose at home  lately. Her hgbA1C is up from last visit but still at goal, she has been following a diabetic diet and has decreased portion size . She denies hypoglycemia, no polyphagia or polydipsia, or polyuria.  She is on Ace for diabetic CKI, states gastroparesis symptoms are controlled - not on medication. Frozen shoulder on right side , still has some pain with rom. Never seen by Ortho, discussed PT at home, she does not want a referral at this time  HTN: at goal, no side effects of medication. No chest pain or palpitation.   URI: symptoms started two days ago, with scratchy throat, post-nasal drainage, and a dull headache. She has been taking Tylenol, not currently taking allergy medications or using nasal spray  Menopausal Symptoms: she weaned self off Premarin, but is having severe night sweats and hot flashes since she stopped medication and wants to go back on medication.  Discussed risk and benefits, she wants to resume it now.   Obesity: she gained some weight since last  visit, she is no longer following a diet, she states I complained when she lost weight - and referred her to the cancer center, so she is eating again.   OSA: not using a CPAP machine, discussed increase risk of strokes and MI's without compliance, she states night sweats are too severe for her to use the mask.   Major Depression Mild: she denies crying spells. She has some fatigue, also anhedonia, she is  taking antidepressants - Citalopram, and states symptoms are mild and does not want to change medication   Patient Active Problem List   Diagnosis Date Noted  . Well controlled type 2 diabetes mellitus with gastroparesis ([place]) 06/22/2015  . Low TSH level 06/22/2015  . Anemia 04/07/2015  . Intestinal metaplasia of gastric mucosa 03/11/2015  . CAD (coronary artery disease), native coronary artery 02/24/2015  . History of coronary artery stent placement 02/24/2015  . Chronic kidney disease, stage 3, mod decreased GFR 02/24/2015  . Menopause 02/24/2015  . History of Helicobacter pylori infection 02/24/2015  . Mild mitral insufficiency 02/24/2015  . Mild tricuspid insufficiency 02/24/2015  . 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 ([place]) 02/24/2015  . Controlled gout 02/24/2015  . Inflammatory arthritis ([place]) 02/24/2015  . Depression, major, recurrent, mild ([place]) 02/24/2015  . Morbid obesity due to excess calories ([place]) 02/24/2015  . Mild pulmonary hypertension ([place]) 02/24/2015  . Gastroesophageal reflux disease  without esophagitis 02/24/2015  . Perennial allergic rhinitis 02/24/2015  . HLD (hyperlipidemia) 02/20/2015  . Hypertension, benign 02/20/2015  . Apnea, sleep 02/20/2015  . Controlled diabetes mellitus with stage 3 chronic kidney disease, without long-term current use of insulin ([place]) 02/20/2015  . Thrombocytosis ([place]) 01/28/2015    Past Surgical History  Procedure Laterality Date  . Abdominal hysterectomy      total  . Total knee arthroplasty  Right   . Cataract extraction    . Coronary angioplasty with stent placement    . Esophagogastroduodenoscopy N/A 02/16/2015    negative h pylori, focal gastic intestinal metaplasia  . Colonoscopy  01/2014    sigmoid diverticulosis. desc colon TA, hyperplastic polyp    Family History  Problem Relation Age of Onset  . Cancer Sister     unsure  . Alcohol abuse Brother   . Colon cancer Neg Hx   . Liver disease Neg Hx     Social History   Social History  . Marital Status: Married    Spouse Name: N/A  . Number of Children: N/A  . Years of Education: N/A   Occupational History  . Not on file.   Social History Main Topics  . Smoking status: Former Smoker -- 1.00 packs/day for 40 years    Types: Cigarettes    Quit date: 12/30/2012  . Smokeless tobacco: Never Used  . Alcohol Use: No  . Drug Use: No  . Sexual Activity: Not Currently   Other Topics Concern  . Not on file   Social History Narrative     Current outpatient prescriptions:  .  allopurinol (ZYLOPRIM) 100 MG tablet, Take 1 tablet (100 mg total) by mouth daily., Disp: 90 tablet, Rfl: 3 .  amLODipine (NORVASC) 5 MG tablet, Take 1 tablet (5 mg total) by mouth daily., Disp: 90 tablet, Rfl: 3 .  aspirin EC 81 MG tablet, Take 81 mg by mouth daily., Disp: , Rfl:  .  citalopram (CELEXA) 20 MG tablet, Take 1 tablet (20 mg total) by mouth daily., Disp: 90 tablet, Rfl: 3 .  estradiol (ESTRACE) 0.5 MG tablet, Take 1 tablet (0.5 mg total) by mouth daily., Disp: 90 tablet, Rfl: 3 .  ferrous sulfate 325 (65 FE) MG tablet, Take 1 tablet (325 mg total) by mouth daily with breakfast., Disp: 90 tablet, Rfl: 3 .  fexofenadine (ALLEGRA) 180 MG tablet, Take 180 mg by mouth daily., Disp: , Rfl:  .  fluticasone (FLOVENT DISKUS) 50 MCG/BLIST diskus inhaler, Inhale 1 puff into the lungs 2 (two) times daily., Disp: 3 Inhaler, Rfl: 3 .  glipiZIDE (GLUCOTROL) 5 MG tablet, Take 1 tablet (5 mg total) by mouth 2 (two) times daily before a meal. 2  tabs in morning, 1 in evening, Disp: 180 tablet, Rfl: 3 .  isosorbide mononitrate (IMDUR) 60 MG 24 hr tablet, Take 1 tablet (60 mg total) by mouth daily., Disp: 60 tablet, Rfl: 3 .  lisinopril (PRINIVIL,ZESTRIL) 40 MG tablet, Take 1 tablet (40 mg total) by mouth 2 (two) times daily., Disp: 180 tablet, Rfl: 3 .  metoprolol succinate (TOPROL-XL) 50 MG 24 hr tablet, Take 1 tablet (50 mg total) by mouth daily., Disp: 90 tablet, Rfl: 3 .  pantoprazole (PROTONIX) 40 MG tablet, Take 1 tablet (40 mg total) by mouth every morning., Disp: 90 tablet, Rfl: 3 .  potassium chloride (K-DUR,KLOR-CON) 10 MEQ tablet, Take 1 tablet (10 mEq total) by mouth daily as needed., Disp: 90 tablet, Rfl: 3 .  rosuvastatin (CRESTOR)  20 MG tablet, Take 1 tablet (20 mg total) by mouth daily., Disp: 90 tablet, Rfl: 3 .  sitaGLIPtin-metformin (JANUMET) 50-1000 MG tablet, Take 1 tablet by mouth 2 (two) times daily., Disp: 180 tablet, Rfl: 3 .  torsemide (DEMADEX) 20 MG tablet, Take 1 tablet (20 mg total) by mouth daily., Disp: 90 tablet, Rfl: 3  Allergies  Allergen Reactions  . Codeine   . Contrast Media [Iodinated Diagnostic Agents] Itching     ROS  Constitutional: Negative for fever , positive for  weight change.  Respiratory: Positive  for mild cough , no shortness of breath.   Cardiovascular: Negative for chest pain or palpitations.  Gastrointestinal: Negative for abdominal pain, no bowel changes.  Musculoskeletal: Positive for mild gait problem ( from knee pain)  or joint swelling.  Skin: Negative for rash.  Neurological: Negative for dizziness , positive for headache.  No other specific complaints in a complete review of systems (except as listed in HPI above).  Objective  Filed Vitals:   06/28/15 0904  BP: 118/78  Pulse: 82  Temp: 97.4 F (36.3 C)  TempSrc: Oral  Resp: 14  Height: 5' (1.524 m)  Weight: 214 lb 12.8 oz (97.433 kg)  SpO2: 94%    Body mass index is 41.95 kg/(m^2).  Physical  Exam  Constitutional: Patient appears well-developed and well-nourished. Obese No distress.  HEENT: head atraumatic, normocephalic, pupils equal and reactive to light, ears normal TM's, neck supple, throat within normal limits Cardiovascular: Normal rate, regular rhythm and normal heart sounds.  No murmur heard. No BLE edema. Pulmonary/Chest: Effort normal and breath sounds normal. No respiratory distress. Abdominal: Soft.  There is no tenderness. Psychiatric: Patient has a normal mood and affect. behavior is normal. Judgment and thought content normal.  Recent Results (from the past 2160 hour(s))  CBC with Differential     Status: Abnormal   Collection Time: 04/02/15  1:24 PM  Result Value Ref Range   WBC 7.3 3.6 - 11.0 K/uL   RBC 4.78 3.80 - 5.20 MIL/uL   Hemoglobin 12.7 12.0 - 16.0 g/dL   HCT 40.5 35.0 - 47.0 %   MCV 84.9 80.0 - 100.0 fL   MCH 26.6 26.0 - 34.0 pg   MCHC 31.3 (L) 32.0 - 36.0 g/dL   RDW 22.7 (H) 11.5 - 14.5 %   Platelets 288 150 - 440 K/uL   Neutrophils Relative % 45 %   Neutro Abs 3.3 1.4 - 6.5 K/uL   Lymphocytes Relative 45 %   Lymphs Abs 3.3 1.0 - 3.6 K/uL   Monocytes Relative 7 %   Monocytes Absolute 0.5 0.2 - 0.9 K/uL   Eosinophils Relative 2 %   Eosinophils Absolute 0.1 0 - 0.7 K/uL   Basophils Relative 1 %   Basophils Absolute 0.0 0 - 0.1 K/uL  Ferritin     Status: None   Collection Time: 04/02/15  1:24 PM  Result Value Ref Range   Ferritin 11 11 - 307 ng/mL  TSH     Status: Abnormal   Collection Time: 06/21/15 11:12 AM  Result Value Ref Range   TSH 0.402 (L) 0.450 - 4.500 uIU/mL  POCT UA - Microalbumin     Status: Abnormal   Collection Time: 06/28/15  9:10 AM  Result Value Ref Range   Microalbumin Ur, POC 20 mg/L   Creatinine, POC  mg/dL   Albumin/Creatinine Ratio, Urine, POC    POCT HgB A1C     Status: Abnormal   Collection  Time: 06/28/15  9:13 AM  Result Value Ref Range   Hemoglobin A1C 6.4      PHQ2/9: Depression screen [facility] 2/9  06/28/2015 02/24/2015  Decreased Interest 0 0  Down, Depressed, Hopeless 0 0  PHQ - 2 Score 0 0     Fall Risk: Fall Risk  06/28/2015 02/24/2015  Falls in the past year? No No     Functional Status Survey: Is the patient deaf or have difficulty hearing?: No Does the patient have difficulty seeing, even when wearing glasses/contacts?: Yes (glasses) Does the patient have difficulty concentrating, remembering, or making decisions?: No Does the patient have difficulty walking or climbing stairs?: No Does the patient have difficulty dressing or bathing?: No Does the patient have difficulty doing errands alone such as visiting a doctor's office or shopping?: No    Assessment & Plan  1. Well controlled type 2 diabetes mellitus with gastroparesis ([place])  Doing well, continue medications, getting it though [facility] now, in the donut hole - POCT HgB A1C - POCT UA - Microalbumin  2. Needs flu shot  - Flu Vaccine QUAD 36+ mos PF IM (Fluarix & Fluzone Quad PF)  3. Chronic kidney disease, stage 3, mod decreased GFR  Recheck labs  4. Low TSH level  - TSH ( recheck in 5 weeks)  5. Apnea, sleep  Needs to resume CPAP  6. Thrombocytosis (HCC)  Seeing Hematologist  - CBC with Differential/Platelet  7. Hypertension, benign  At goal  - Comprehensive metabolic panel  8. Controlled type 2 diabetes mellitus with stage 3 chronic kidney disease, without long-term current use of insulin (HCC)  Continue medications  9. Morbid obesity due to excess calories [facility])  Discussed with the patient the risk posed by an increased BMI. Discussed importance of portion control, calorie counting and at least 150 minutes of physical activity weekly. Avoid sweet beverages and drink more water. Eat at least 6 servings of fruit and vegetables daily   10. Depression, major, recurrent, mild (HCC)  Stable, does not want to change medication, continue Celexa  11. Menopause  Resume Premarin, ordered  from [place]  12. HLD (hyperlipidemia)  - Lipid panel  13. Upper respiratory infection  Discussed saline spray, resume nasal steroid

## 2015-06-29 LAB — CBC WITH DIFFERENTIAL/PLATELET
BASOS ABS: 0 10*3/uL (ref 0.0–0.2)
Basos: 0 %
EOS (ABSOLUTE): 0.2 10*3/uL (ref 0.0–0.4)
Eos: 3 %
HEMOGLOBIN: 12 g/dL (ref 11.1–15.9)
Hematocrit: 36.3 % (ref 34.0–46.6)
Immature Grans (Abs): 0 10*3/uL (ref 0.0–0.1)
Immature Granulocytes: 0 %
LYMPHS ABS: 3.8 10*3/uL — AB (ref 0.7–3.1)
Lymphs: 43 %
MCH: 29.6 pg (ref 26.6–33.0)
MCHC: 33.1 g/dL (ref 31.5–35.7)
MCV: 90 fL (ref 79–97)
MONOS ABS: 0.5 10*3/uL (ref 0.1–0.9)
Monocytes: 6 %
NEUTROS ABS: 4.1 10*3/uL (ref 1.4–7.0)
Neutrophils: 48 %
Platelets: 347 10*3/uL (ref 150–379)
RBC: 4.05 x10E6/uL (ref 3.77–5.28)
RDW: 15.6 % — AB (ref 12.3–15.4)
WBC: 8.7 10*3/uL (ref 3.4–10.8)

## 2015-06-29 LAB — LIPID PANEL
CHOL/HDL RATIO: 2.7 ratio (ref 0.0–4.4)
CHOLESTEROL TOTAL: 143 mg/dL (ref 100–199)
HDL: 53 mg/dL (ref >39)
LDL CALC: 62 mg/dL (ref 0–99)
TRIGLYCERIDES: 139 mg/dL (ref 0–149)
VLDL Cholesterol Cal: 28 mg/dL (ref 5–40)

## 2015-06-29 LAB — COMPREHENSIVE METABOLIC PANEL
A/G RATIO: 1.7 (ref 1.1–2.5)
ALBUMIN: 4.3 g/dL (ref 3.6–4.8)
ALK PHOS: 68 IU/L (ref 39–117)
ALT: 9 IU/L (ref 0–32)
AST: 13 IU/L (ref 0–40)
BUN / CREAT RATIO: 20 (ref 11–26)
BUN: 17 mg/dL (ref 8–27)
CHLORIDE: 101 mmol/L (ref 97–106)
CO2: 23 mmol/L (ref 18–29)
Calcium: 10.1 mg/dL (ref 8.7–10.3)
Creatinine, Ser: 0.83 mg/dL (ref 0.57–1.00)
GFR calc non Af Amer: 75 mL/min/{1.73_m2} (ref >59)
GFR, EST AFRICAN AMERICAN: 87 mL/min/{1.73_m2} (ref >59)
GLOBULIN, TOTAL: 2.5 g/dL (ref 1.5–4.5)
Glucose: 152 mg/dL — ABNORMAL HIGH (ref 65–99)
POTASSIUM: 5.1 mmol/L (ref 3.5–5.2)
SODIUM: 143 mmol/L (ref 136–144)
TOTAL PROTEIN: 6.8 g/dL (ref 6.0–8.5)

## 2015-06-30 ENCOUNTER — Telehealth: Payer: Self-pay | Admitting: Family Medicine

## 2015-06-30 NOTE — Telephone Encounter (Signed)
PHARM NEEDS A DIFFERENT RX CALLED IN FOR FLOWVENT AND WANTS TO CHANGE IT TO QVAR INSTEAD. PLEASE CALL AND LET THEM KNOW IF IT CAN BE CHANGED

## 2015-07-01 ENCOUNTER — Telehealth: Payer: Self-pay | Admitting: Family Medicine

## 2015-07-01 NOTE — Telephone Encounter (Signed)
Gave verbal for Qvar 80 1 puff bid

## 2015-07-01 NOTE — Telephone Encounter (Signed)
[employer] referral obtained Authorization for Dr. Stann Ore ICD-10: D47.3 Auth # [id number] Start - 07/05/2015 Expires - 01/01/2016

## 2015-07-01 NOTE — Telephone Encounter (Signed)
Patient has appointment with Dr Mike Gip at the cancer center on 07-05-15 @ 230p. Patient has [facility] and her original referral has expired. They are requesting a new referral to be sent over. Thank you

## 2015-07-02 ENCOUNTER — Telehealth: Payer: Self-pay

## 2015-07-02 NOTE — Telephone Encounter (Signed)
Medication management at [facility] does not have crestor.  Wants to know if you can write for atorvastatin or simvastatin?

## 2015-07-05 ENCOUNTER — Inpatient Hospital Stay: Payer: Commercial Managed Care - HMO

## 2015-07-05 ENCOUNTER — Encounter: Payer: Self-pay | Admitting: Hematology and Oncology

## 2015-07-05 ENCOUNTER — Inpatient Hospital Stay: Payer: Commercial Managed Care - HMO | Attending: Hematology and Oncology | Admitting: Hematology and Oncology

## 2015-07-05 VITALS — BP 111/66 | HR 82 | Temp 97.5°F | Resp 18 | Ht 65.5 in | Wt 217.6 lb

## 2015-07-05 DIAGNOSIS — Z7982 Long term (current) use of aspirin: Secondary | ICD-10-CM | POA: Diagnosis not present

## 2015-07-05 DIAGNOSIS — Z87891 Personal history of nicotine dependence: Secondary | ICD-10-CM

## 2015-07-05 DIAGNOSIS — Z8619 Personal history of other infectious and parasitic diseases: Secondary | ICD-10-CM | POA: Insufficient documentation

## 2015-07-05 DIAGNOSIS — Z800 Family history of malignant neoplasm of digestive organs: Secondary | ICD-10-CM | POA: Diagnosis not present

## 2015-07-05 DIAGNOSIS — G4730 Sleep apnea, unspecified: Secondary | ICD-10-CM | POA: Diagnosis not present

## 2015-07-05 DIAGNOSIS — N289 Disorder of kidney and ureter, unspecified: Secondary | ICD-10-CM

## 2015-07-05 DIAGNOSIS — J449 Chronic obstructive pulmonary disease, unspecified: Secondary | ICD-10-CM | POA: Insufficient documentation

## 2015-07-05 DIAGNOSIS — Z79899 Other long term (current) drug therapy: Secondary | ICD-10-CM | POA: Diagnosis not present

## 2015-07-05 DIAGNOSIS — E119 Type 2 diabetes mellitus without complications: Secondary | ICD-10-CM | POA: Diagnosis not present

## 2015-07-05 DIAGNOSIS — D649 Anemia, unspecified: Secondary | ICD-10-CM

## 2015-07-05 DIAGNOSIS — K5790 Diverticulosis of intestine, part unspecified, without perforation or abscess without bleeding: Secondary | ICD-10-CM | POA: Insufficient documentation

## 2015-07-05 DIAGNOSIS — Z809 Family history of malignant neoplasm, unspecified: Secondary | ICD-10-CM

## 2015-07-05 DIAGNOSIS — F419 Anxiety disorder, unspecified: Secondary | ICD-10-CM | POA: Insufficient documentation

## 2015-07-05 DIAGNOSIS — I2510 Atherosclerotic heart disease of native coronary artery without angina pectoris: Secondary | ICD-10-CM | POA: Diagnosis not present

## 2015-07-05 DIAGNOSIS — I10 Essential (primary) hypertension: Secondary | ICD-10-CM | POA: Insufficient documentation

## 2015-07-05 DIAGNOSIS — D509 Iron deficiency anemia, unspecified: Secondary | ICD-10-CM | POA: Insufficient documentation

## 2015-07-05 DIAGNOSIS — Z7984 Long term (current) use of oral hypoglycemic drugs: Secondary | ICD-10-CM

## 2015-07-05 DIAGNOSIS — M129 Arthropathy, unspecified: Secondary | ICD-10-CM | POA: Insufficient documentation

## 2015-07-05 DIAGNOSIS — D75839 Thrombocytosis, unspecified: Secondary | ICD-10-CM

## 2015-07-05 DIAGNOSIS — D473 Essential (hemorrhagic) thrombocythemia: Secondary | ICD-10-CM | POA: Insufficient documentation

## 2015-07-05 DIAGNOSIS — M109 Gout, unspecified: Secondary | ICD-10-CM | POA: Diagnosis not present

## 2015-07-05 DIAGNOSIS — F329 Major depressive disorder, single episode, unspecified: Secondary | ICD-10-CM | POA: Insufficient documentation

## 2015-07-05 LAB — CBC WITH DIFFERENTIAL/PLATELET
Basophils Absolute: 0 10*3/uL (ref 0–0.1)
Basophils Relative: 0 %
Eosinophils Absolute: 0.3 10*3/uL (ref 0–0.7)
Eosinophils Relative: 3 %
HCT: 35.7 % (ref 35.0–47.0)
Hemoglobin: 11.7 g/dL — ABNORMAL LOW (ref 12.0–16.0)
Lymphocytes Relative: 42 %
Lymphs Abs: 3.5 10*3/uL (ref 1.0–3.6)
MCH: 29.9 pg (ref 26.0–34.0)
MCHC: 32.8 g/dL (ref 32.0–36.0)
MCV: 91 fL (ref 80.0–100.0)
Monocytes Absolute: 0.6 10*3/uL (ref 0.2–0.9)
Monocytes Relative: 7 %
Neutro Abs: 4 10*3/uL (ref 1.4–6.5)
Neutrophils Relative %: 48 %
Platelets: 306 10*3/uL (ref 150–440)
RBC: 3.92 MIL/uL (ref 3.80–5.20)
RDW: 15.2 % — ABNORMAL HIGH (ref 11.5–14.5)
WBC: 8.4 10*3/uL (ref 3.6–11.0)

## 2015-07-05 LAB — FERRITIN: Ferritin: 10 ng/mL — ABNORMAL LOW (ref 11–307)

## 2015-07-05 MED ORDER — ATORVASTATIN CALCIUM 40 MG PO TABS: 40.0000 mg | ORAL_TABLET | Freq: Every day | ORAL | Status: DC

## 2015-07-06 ENCOUNTER — Telehealth: Payer: Self-pay

## 2015-07-06 MED ORDER — ATORVASTATIN CALCIUM 40 MG PO TABS: 40.0000 mg | ORAL_TABLET | Freq: Every day | ORAL | Status: DC

## 2015-07-06 MED ORDER — BECLOMETHASONE DIPROPIONATE 40 MCG/ACT IN AERS: 2.0000 | INHALATION_SPRAY | Freq: Two times a day (BID) | RESPIRATORY_TRACT | Status: DC

## 2015-07-06 NOTE — Telephone Encounter (Signed)
Dana Sparks from Medication Management called and stated they needed new prescriptions and authorization, due to not being able to receive certain medications. There is two they no longer carry: Flovent and Crestor. But they do offer Qvar in replacement of Flovent, and offer Simvastatin or Atorvastatin 10 mg in place of Crestor.

## 2015-07-21 ENCOUNTER — Encounter: Payer: Self-pay | Admitting: Hematology and Oncology

## 2015-07-21 NOTE — Progress Notes (Signed)
[facility] day:  07/05/2015  Chief Complaint: Dana Sparks is a 64 y.o. female with iron deficiency anemia and reactive thrombocytosis who is seen for 3 month assessment.  HPI: The patient was last seen in the medical oncology clinic on 04/02/2015.  At that time, she felt fine. She was eating lots of iron rich foods. Stools were dark secondary to iron. She was taking iron pills with orange juice or vitamin C twice a day. Her energy level was a little bit better. She denied any pica.  CBC included a hematocrit of 40.5, hemoglobin 12.7, MCV 84.9, platelets 288,000, white count 7300 with an ANC of 3300.  During the interim, the patient states that she feels fine. She has no problems except for her allergies. She continues oral iron twice a day. She is eating well. She denies any pica.  Past Medical History  Diagnosis Date  . Diverticulitis   . Allergy   . Positive H. pylori test   . Renal insufficiency   . Gastroparesis   . Anxiety   . Sleep apnea   . COPD (chronic obstructive pulmonary disease) ([place])   . Gout   . Diabetes mellitus without complication ([place])   . Depression   . Arthritis   . Hypertension   . CAD (coronary artery disease)   . Hyperlipemia   . Thrombocytosis ([place]) 01/28/2015  . Tubular adenoma of colon 01/20/14    Past Surgical History  Procedure Laterality Date  . Abdominal hysterectomy      total  . Total knee arthroplasty Right   . Cataract extraction    . Coronary angioplasty with stent placement    . Esophagogastroduodenoscopy N/A 02/16/2015    negative h pylori, focal gastic intestinal metaplasia  . Colonoscopy  01/2014    sigmoid diverticulosis. desc colon TA, hyperplastic polyp    Family History  Problem Relation Age of Onset  . Cancer Sister     unsure  . Alcohol abuse Brother   . Colon cancer Neg Hx   . Liver disease Neg Hx     Social History:  reports that she quit smoking about 2 years ago. Her  smoking use included Cigarettes. She has a 40 pack-year smoking history. She has never used smokeless tobacco. She reports that she does not drink alcohol or use illicit drugs.  The patient is alone today.  Allergies:  Allergies  Allergen Reactions  . Codeine   . Contrast Media [Iodinated Diagnostic Agents] Itching    Current Medications: Current Outpatient Prescriptions  Medication Sig Dispense Refill  . allopurinol (ZYLOPRIM) 100 MG tablet Take 1 tablet (100 mg total) by mouth daily. 90 tablet 3  . amLODipine (NORVASC) 5 MG tablet Take 1 tablet (5 mg total) by mouth daily. 90 tablet 3  . aspirin EC 81 MG tablet Take 81 mg by mouth daily.    . citalopram (CELEXA) 20 MG tablet Take 1 tablet (20 mg total) by mouth daily. 90 tablet 3  . ferrous sulfate 325 (65 FE) MG tablet Take 1 tablet (325 mg total) by mouth daily with breakfast. (Patient taking differently: Take 325 mg by mouth 2 (two) times daily with a meal. ) 90 tablet 3  . fexofenadine (ALLEGRA) 180 MG tablet Take 180 mg by mouth daily.    Marland Kitchen glipiZIDE (GLUCOTROL) 5 MG tablet Take 1 tablet (5 mg total) by mouth 2 (two) times daily before a meal. 2 tabs in morning, 1 in evening  180 tablet 3  . isosorbide mononitrate (IMDUR) 60 MG 24 hr tablet Take 1 tablet (60 mg total) by mouth daily. 60 tablet 3  . lisinopril (PRINIVIL,ZESTRIL) 40 MG tablet Take 1 tablet (40 mg total) by mouth 2 (two) times daily. 180 tablet 3  . metoprolol succinate (TOPROL-XL) 50 MG 24 hr tablet Take 1 tablet (50 mg total) by mouth daily. 90 tablet 3  . pantoprazole (PROTONIX) 40 MG tablet Take 1 tablet (40 mg total) by mouth every morning. 90 tablet 3  . potassium chloride (K-DUR,KLOR-CON) 10 MEQ tablet Take 1 tablet (10 mEq total) by mouth daily as needed. 90 tablet 3  . sitaGLIPtin-metformin (JANUMET) 50-1000 MG tablet Take 1 tablet by mouth 2 (two) times daily. 180 tablet 3  . atorvastatin (LIPITOR) 40 MG tablet Take 1 tablet (40 mg total) by mouth daily. 90  tablet 3  . beclomethasone (QVAR) 40 MCG/ACT inhaler Inhale 2 puffs into the lungs 2 (two) times daily. 3 Inhaler 3  . estradiol (ESTRACE) 0.5 MG tablet Take 1 tablet (0.5 mg total) by mouth daily. (Patient not taking: Reported on 07/05/2015) 90 tablet 3  . torsemide (DEMADEX) 20 MG tablet Take 1 tablet (20 mg total) by mouth daily. (Patient taking differently: Take 20 mg by mouth as needed. ) 90 tablet 3   No current facility-administered medications for this visit.    Review of Systems:  GENERAL:  Feels fine.  No problems.  No fevers, sweats or weight loss. PERFORMANCE STATUS (ECOG):  1 HEENT:  Allergies.  No visual changes, runny nose, sore throat, mouth sores or tenderness. Lungs: No shortness of breath or cough.  No hemoptysis. Cardiac:  No chest pain, palpitations, orthopnea, or PND. GI:  Stools dark on oral iron.  No nausea, vomiting, diarrhea, constipation, melena or hematochezia. GU:  No urgency, frequency, dysuria, or hematuria. Musculoskeletal:  No back pain.  No joint pain.  No muscle tenderness. Extremities:  No pain or swelling. Skin:  No rashes or skin changes. Neuro:  No headache, numbness or weakness, balance or coordination issues. Endocrine:  No diabetes, thyroid issues, hot flashes or night sweats. Psych:  No mood changes, depression or anxiety. Pain:  No focal pain. Review of systems:  All other systems reviewed and found to be negative.  Physical Exam: Blood pressure 111/66, pulse 82, temperature 97.5 F (36.4 C), temperature source Tympanic, resp. rate 18, height 5' 5.5" (1.664 m), weight 217 lb 9.5 oz (98.7 kg). GENERAL:  Well developed, well nourished, sitting comfortably in the exam room in no acute distress. MENTAL STATUS:  Alert and oriented to person, place and time. HEAD:  Long black hair with graying.  Normocephalic, atraumatic, face symmetric, no Cushingoid features. EYES:  Glasses.  Brown eyes.  Pupils equal round and reactive to light and accomodation.   No conjunctivitis or scleral icterus. ENT:  Oropharynx clear without lesion.  Tongue normal. Mucous membranes moist.  RESPIRATORY:  Clear to auscultation without rales, wheezes or rhonchi. CARDIOVASCULAR:  Regular rate and rhythm without murmur, rub or gallop. ABDOMEN:  Soft, non-tender, with active bowel sounds, and no hepatosplenomegaly.  No masses. SKIN:  No rashes, ulcers or lesions. EXTREMITIES: No edema, no skin discoloration or tenderness.  No palpable cords. LYMPH NODES: No palpable cervical, supraclavicular, axillary or inguinal adenopathy  NEUROLOGICAL: Unremarkable. PSYCH:  Appropriate.  Appointment on 07/05/2015  Component Date Value Ref Range Status  . WBC 07/05/2015 8.4  3.6 - 11.0 K/uL Final  . RBC 07/05/2015 3.92  3.80 -  5.20 MIL/uL Final  . Hemoglobin 07/05/2015 11.7* 12.0 - 16.0 g/dL Final  . HCT 07/05/2015 35.7  35.0 - 47.0 % Final  . MCV 07/05/2015 91.0  80.0 - 100.0 fL Final  . MCH 07/05/2015 29.9  26.0 - 34.0 pg Final  . MCHC 07/05/2015 32.8  32.0 - 36.0 g/dL Final  . RDW 07/05/2015 15.2* 11.5 - 14.5 % Final  . Platelets 07/05/2015 306  150 - 440 K/uL Final  . Neutrophils Relative % 07/05/2015 48   Final  . Neutro Abs 07/05/2015 4.0  1.4 - 6.5 K/uL Final  . Lymphocytes Relative 07/05/2015 42   Final  . Lymphs Abs 07/05/2015 3.5  1.0 - 3.6 K/uL Final  . Monocytes Relative 07/05/2015 7   Final  . Monocytes Absolute 07/05/2015 0.6  0.2 - 0.9 K/uL Final  . Eosinophils Relative 07/05/2015 3   Final  . Eosinophils Absolute 07/05/2015 0.3  0 - 0.7 K/uL Final  . Basophils Relative 07/05/2015 0   Final  . Basophils Absolute 07/05/2015 0.0  0 - 0.1 K/uL Final  . Ferritin 07/05/2015 10* 11 - 307 ng/mL Final    Assessment:  ALBERTO DONG is a 64 y.o. female with iron deficiency anemia and reactive thrombocytosis.  She had positive H pylori serologies and breath test.  She completed an antibiotics course.  Her diet is good.  She denies any GI bleeding (melena or  hematochezia), vaginal or GU bleeding. Colonoscopy on 01/20/2014 and revealed a 6 mm polyp in the descending colon, a 5 mm polyp in the sigmoid colon, and diverticulosis.  Pathology was benign.  Upper endoscopy on 02/16/2015 was normal.  Work-up on 12/22/2014 confirmed iron deficiency.  Labs included a hematocrit 29.5, hemoglobin 8.8, MCV 74, platelets 428,000 and white count 6800 with an ANC of 3332. Ferritin was 4 (low) with a TIBC of 518 (elevated). Iron saturation was 3%. Folate was 23.3. Guaiac cards were negative.   Diet is high in iron rich foods.  She is taking oral iron 2 pills a day with orange juice or vitamin C.  Symptomatically, she continues to feel well.  Exam in unremarkable.  Ferritin is 10.  1.  Labs today:  CBC witth diff, ferritin. 2.  Continue oral iron. Ferritin goal 100-400. 3.  Nurse to all patient with results. 4,  Patient notes lab draw with PCP every 3 months. 5.  RTC in 6 months for MD assessment and labs (CBC with diff, ferritin) or prn.   Lequita Asal, MD  07/05/2015

## 2015-08-05 ENCOUNTER — Other Ambulatory Visit: Payer: Self-pay | Admitting: Family Medicine

## 2015-08-05 ENCOUNTER — Encounter: Payer: Self-pay | Admitting: Family Medicine

## 2015-08-05 DIAGNOSIS — R946 Abnormal results of thyroid function studies: Secondary | ICD-10-CM | POA: Diagnosis not present

## 2015-08-06 LAB — TSH: TSH: 0.525 u[IU]/mL (ref 0.450–4.500)

## 2015-09-09 ENCOUNTER — Telehealth: Payer: Self-pay | Admitting: Family Medicine

## 2015-09-09 NOTE — Telephone Encounter (Signed)
Okay to take Coricidin HBP, she can also use neti pot or nasal saline

## 2015-09-09 NOTE — Telephone Encounter (Signed)
Pt called stating she is sick and went and got some OTC meds and she wants to be sure its ok to take with her other meds. Please advise pt.

## 2015-09-10 ENCOUNTER — Telehealth: Payer: Self-pay

## 2015-09-10 ENCOUNTER — Encounter: Payer: Self-pay | Admitting: Family Medicine

## 2015-09-10 ENCOUNTER — Ambulatory Visit (INDEPENDENT_AMBULATORY_CARE_PROVIDER_SITE_OTHER): Payer: Commercial Managed Care - HMO | Admitting: Family Medicine

## 2015-09-10 VITALS — BP 128/86 | HR 102 | Temp 97.6°F | Resp 16 | Ht 65.5 in | Wt 218.0 lb

## 2015-09-10 DIAGNOSIS — J069 Acute upper respiratory infection, unspecified: Secondary | ICD-10-CM

## 2015-09-10 MED ORDER — BENZONATATE 100 MG PO CAPS: 100.0000 mg | ORAL_CAPSULE | Freq: Three times a day (TID) | ORAL | Status: DC | PRN

## 2015-09-10 MED ORDER — FLUTICASONE PROPIONATE 50 MCG/ACT NA SUSP: 2.0000 | Freq: Every day | NASAL | Status: DC

## 2015-09-10 MED ORDER — HYDROCOD POLST-CPM POLST ER 10-8 MG/5ML PO SUER: 5.0000 mL | Freq: Two times a day (BID) | ORAL | Status: DC | PRN

## 2015-09-10 MED ORDER — DEXTROMETHORPHAN-GUAIFENESIN 10-200 MG PO CAPS: 1.0000 | ORAL_CAPSULE | Freq: Four times a day (QID) | ORAL | Status: DC

## 2015-09-10 NOTE — Progress Notes (Signed)
Name: Dana Sparks   MRN: [id number]    DOB: 1951-07-17   Date:09/10/2015       Progress Note  Subjective  Chief Complaint  Chief Complaint  Patient presents with  . URI    patient presents with sneezing, facial and ear pressure, lots of dry coughing, clear nasal drainage and a terrible headache since yesterday.    HPI  URI: she got sick yesterday, severe symptoms during the night with dry cough, itchy throat, itchy ears, clear rhinorrhea, nose is burning, head is congested. No fever, no chills, normal appetite. No rashes. Taking allergy medications and added Benadryl but is not helping with her symptoms.   Patient Active Problem List   Diagnosis Date Noted  . Diabetes mellitus ([place]) 09/10/2015  . Morbid obesity ([place]) 08/05/2015  . Well controlled type 2 diabetes mellitus with gastroparesis ([place]) 06/22/2015  . Low TSH level 06/22/2015  . Iron deficiency anemia 04/07/2015  . Intestinal metaplasia of gastric mucosa 03/11/2015  . CAD (coronary artery disease), native coronary artery 02/24/2015  . History of coronary artery stent placement 02/24/2015  . Chronic kidney disease, stage 3, mod decreased GFR 02/24/2015  . Menopause 02/24/2015  . History of Helicobacter pylori infection 02/24/2015  . Mild mitral insufficiency 02/24/2015  . Mild tricuspid insufficiency 02/24/2015  . 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 ([place]) 02/24/2015  . Controlled gout 02/24/2015  . Inflammatory arthritis ([place]) 02/24/2015  . Depression, major, recurrent, mild ([place]) 02/24/2015  . Morbid obesity due to excess calories ([place]) 02/24/2015  . Mild pulmonary hypertension ([place]) 02/24/2015  . Gastroesophageal reflux disease without esophagitis 02/24/2015  . Perennial allergic rhinitis 02/24/2015  . HLD (hyperlipidemia) 02/20/2015  . Hypertension, benign 02/20/2015  . Apnea, sleep 02/20/2015  . Controlled diabetes mellitus with stage 3 chronic kidney disease, without long-term current  use of insulin ([place]) 02/20/2015  . Thrombocytosis ([place]) 01/28/2015    Past Surgical History  Procedure Laterality Date  . Abdominal hysterectomy      total  . Total knee arthroplasty Right   . Cataract extraction    . Coronary angioplasty with stent placement    . Esophagogastroduodenoscopy N/A 02/16/2015    negative h pylori, focal gastic intestinal metaplasia  . Colonoscopy  01/2014    sigmoid diverticulosis. desc colon TA, hyperplastic polyp    Family History  Problem Relation Age of Onset  . Cancer Sister     unsure  . Alcohol abuse Brother   . Colon cancer Neg Hx   . Liver disease Neg Hx     Social History   Social History  . Marital Status: Married    Spouse Name: N/A  . Number of Children: N/A  . Years of Education: N/A   Occupational History  . Not on file.   Social History Main Topics  . Smoking status: Former Smoker -- 1.00 packs/day for 40 years    Types: Cigarettes    Quit date: 12/30/2012  . Smokeless tobacco: Never Used  . Alcohol Use: No  . Drug Use: No  . Sexual Activity: Not Currently   Other Topics Concern  . Not on file   Social History Narrative     Current outpatient prescriptions:  .  allopurinol (ZYLOPRIM) 100 MG tablet, Take 1 tablet (100 mg total) by mouth daily., Disp: 90 tablet, Rfl: 3 .  amLODipine (NORVASC) 5 MG tablet, Take 1 tablet (5 mg total) by mouth daily., Disp: 90 tablet, Rfl: 3 .  aspirin EC 81  MG tablet, Take 81 mg by mouth daily., Disp: , Rfl:  .  atorvastatin (LIPITOR) 40 MG tablet, Take 1 tablet (40 mg total) by mouth daily., Disp: 90 tablet, Rfl: 3 .  beclomethasone (QVAR) 40 MCG/ACT inhaler, Inhale 2 puffs into the lungs 2 (two) times daily., Disp: 3 Inhaler, Rfl: 3 .  citalopram (CELEXA) 20 MG tablet, Take 1 tablet (20 mg total) by mouth daily., Disp: 90 tablet, Rfl: 3 .  estradiol (ESTRACE) 0.5 MG tablet, Take 1 tablet (0.5 mg total) by mouth daily., Disp: 90 tablet, Rfl: 3 .  ferrous sulfate 325 (65 FE) MG  tablet, Take 1 tablet (325 mg total) by mouth daily with breakfast. (Patient taking differently: Take 325 mg by mouth 2 (two) times daily with a meal. ), Disp: 90 tablet, Rfl: 3 .  fexofenadine (ALLEGRA) 180 MG tablet, Take 180 mg by mouth daily., Disp: , Rfl:  .  FLOVENT DISKUS 50 MCG/BLIST diskus inhaler, , Disp: , Rfl:  .  glipiZIDE (GLUCOTROL) 5 MG tablet, Take 1 tablet (5 mg total) by mouth 2 (two) times daily before a meal. 2 tabs in morning, 1 in evening, Disp: 180 tablet, Rfl: 3 .  isosorbide mononitrate (IMDUR) 60 MG 24 hr tablet, Take 1 tablet (60 mg total) by mouth daily., Disp: 60 tablet, Rfl: 3 .  lisinopril (PRINIVIL,ZESTRIL) 40 MG tablet, Take 1 tablet (40 mg total) by mouth 2 (two) times daily., Disp: 180 tablet, Rfl: 3 .  metoprolol succinate (TOPROL-XL) 50 MG 24 hr tablet, Take 1 tablet (50 mg total) by mouth daily., Disp: 90 tablet, Rfl: 3 .  pantoprazole (PROTONIX) 40 MG tablet, Take 1 tablet (40 mg total) by mouth every morning., Disp: 90 tablet, Rfl: 3 .  potassium chloride (K-DUR,KLOR-CON) 10 MEQ tablet, Take 1 tablet (10 mEq total) by mouth daily as needed., Disp: 90 tablet, Rfl: 3 .  sitaGLIPtin-metformin (JANUMET) 50-1000 MG tablet, Take 1 tablet by mouth 2 (two) times daily., Disp: 180 tablet, Rfl: 3 .  torsemide (DEMADEX) 20 MG tablet, Take 1 tablet (20 mg total) by mouth daily. (Patient taking differently: Take 20 mg by mouth as needed. ), Disp: 90 tablet, Rfl: 3  Allergies  Allergen Reactions  . Codeine   . Contrast Media [Iodinated Diagnostic Agents] Itching     ROS  Ten systems reviewed and is negative except as mentioned in HPI   Objective  Filed Vitals:   09/10/15 1314  BP: 128/86  Pulse: 102  Temp: 97.6 F (36.4 C)  TempSrc: Oral  Resp: 16  Height: 5' 5.5" (1.664 m)  Weight: 218 lb (98.884 kg)  SpO2: 96%    Body mass index is 35.71 kg/(m^2).  Physical Exam  Constitutional: Patient appears well-developed and well-nourished. Obese No  distress.  HEENT: head atraumatic, normocephalic, pupils equal and reactive to light, ears TM normal bilaterally , neck supple, throat within normal limits Cardiovascular: Normal rate, regular rhythm and normal heart sounds.  No murmur heard. No BLE edema. Pulmonary/Chest: Effort normal and breath sounds normal. No respiratory distress. Abdominal: Soft.  There is no tenderness. Psychiatric: Patient has a normal mood and affect. behavior is normal. Judgment and thought content normal.  Recent Results (from the past 2160 hour(s))  TSH     Status: Abnormal   Collection Time: 06/21/15 11:12 AM  Result Value Ref Range   TSH 0.402 (L) 0.450 - 4.500 uIU/mL  POCT UA - Microalbumin     Status: Abnormal   Collection Time: 06/28/15  9:10  AM  Result Value Ref Range   Microalbumin Ur, POC 20 mg/L   Creatinine, POC  mg/dL   Albumin/Creatinine Ratio, Urine, POC    POCT HgB A1C     Status: Abnormal   Collection Time: 06/28/15  9:13 AM  Result Value Ref Range   Hemoglobin A1C 6.4   Comprehensive metabolic panel     Status: Abnormal   Collection Time: 06/28/15 10:17 AM  Result Value Ref Range   Glucose 152 (H) 65 - 99 mg/dL   BUN 17 8 - 27 mg/dL   Creatinine, Ser 0.83 0.57 - 1.00 mg/dL   GFR calc non Af Amer 75 >59 mL/min/1.73   GFR calc Af Amer 87 >59 mL/min/1.73   BUN/Creatinine Ratio 20 11 - 26   Sodium 143 136 - 144 mmol/L    Comment:               **Please note reference interval change**   Potassium 5.1 3.5 - 5.2 mmol/L    Comment:               **Please note reference interval change**   Chloride 101 97 - 106 mmol/L    Comment:               **Please note reference interval change**   CO2 23 18 - 29 mmol/L   Calcium 10.1 8.7 - 10.3 mg/dL   Total Protein 6.8 6.0 - 8.5 g/dL   Albumin 4.3 3.6 - 4.8 g/dL   Globulin, Total 2.5 1.5 - 4.5 g/dL   Albumin/Globulin Ratio 1.7 1.1 - 2.5   Bilirubin Total <0.2 0.0 - 1.2 mg/dL   Alkaline Phosphatase 68 39 - 117 IU/L   AST 13 0 - 40 IU/L   ALT 9  0 - 32 IU/L  CBC with Differential/Platelet     Status: Abnormal   Collection Time: 06/28/15 10:17 AM  Result Value Ref Range   WBC 8.7 3.4 - 10.8 x10E3/uL   RBC 4.05 3.77 - 5.28 x10E6/uL   Hemoglobin 12.0 11.1 - 15.9 g/dL   Hematocrit 36.3 34.0 - 46.6 %   MCV 90 79 - 97 fL   MCH 29.6 26.6 - 33.0 pg   MCHC 33.1 31.5 - 35.7 g/dL   RDW 15.6 (H) 12.3 - 15.4 %   Platelets 347 150 - 379 x10E3/uL   Neutrophils 48 %   Lymphs 43 %   Monocytes 6 %   Eos 3 %   Basos 0 %   Neutrophils Absolute 4.1 1.4 - 7.0 x10E3/uL   Lymphocytes Absolute 3.8 (H) 0.7 - 3.1 x10E3/uL   Monocytes Absolute 0.5 0.1 - 0.9 x10E3/uL   EOS (ABSOLUTE) 0.2 0.0 - 0.4 x10E3/uL   Basophils Absolute 0.0 0.0 - 0.2 x10E3/uL   Immature Granulocytes 0 %   Immature Grans (Abs) 0.0 0.0 - 0.1 x10E3/uL  Lipid panel     Status: None   Collection Time: 06/28/15 10:17 AM  Result Value Ref Range   Cholesterol, Total 143 100 - 199 mg/dL   Triglycerides 139 0 - 149 mg/dL   HDL 53 >39 mg/dL    Comment: According to ATP-III Guidelines, HDL-C >59 mg/dL is considered a negative risk factor for CHD.    VLDL Cholesterol Cal 28 5 - 40 mg/dL   LDL Calculated 62 0 - 99 mg/dL   Chol/HDL Ratio 2.7 0.0 - 4.4 ratio units    Comment:  T. Chol/HDL Ratio                                             Men  Women                               1/2 Avg.Risk  3.4    3.3                                   Avg.Risk  5.0    4.4                                2X Avg.Risk  9.6    7.1                                3X Avg.Risk 23.4   11.0   CBC with Differential     Status: Abnormal   Collection Time: 07/05/15  2:29 PM  Result Value Ref Range   WBC 8.4 3.6 - 11.0 K/uL   RBC 3.92 3.80 - 5.20 MIL/uL   Hemoglobin 11.7 (L) 12.0 - 16.0 g/dL   HCT 35.7 35.0 - 47.0 %   MCV 91.0 80.0 - 100.0 fL   MCH 29.9 26.0 - 34.0 pg   MCHC 32.8 32.0 - 36.0 g/dL   RDW 15.2 (H) 11.5 - 14.5 %   Platelets 306 150 - 440 K/uL   Neutrophils  Relative % 48 %   Neutro Abs 4.0 1.4 - 6.5 K/uL   Lymphocytes Relative 42 %   Lymphs Abs 3.5 1.0 - 3.6 K/uL   Monocytes Relative 7 %   Monocytes Absolute 0.6 0.2 - 0.9 K/uL   Eosinophils Relative 3 %   Eosinophils Absolute 0.3 0 - 0.7 K/uL   Basophils Relative 0 %   Basophils Absolute 0.0 0 - 0.1 K/uL  Ferritin     Status: Abnormal   Collection Time: 07/05/15  2:29 PM  Result Value Ref Range   Ferritin 10 (L) 11 - 307 ng/mL  TSH     Status: None   Collection Time: 08/05/15 11:32 AM  Result Value Ref Range   TSH 0.525 0.450 - 4.500 uIU/mL      PHQ2/9: Depression screen [facility] 2/9 09/10/2015 06/28/2015 02/24/2015  Decreased Interest 0 0 0  Down, Depressed, Hopeless 0 0 0  PHQ - 2 Score 0 0 0    Fall Risk: Fall Risk  09/10/2015 06/28/2015 02/24/2015  Falls in the past year? No No No    Functional Status Survey: Is the patient deaf or have difficulty hearing?: No Does the patient have difficulty seeing, even when wearing glasses/contacts?: No Does the patient have difficulty concentrating, remembering, or making decisions?: No Does the patient have difficulty walking or climbing stairs?: No Does the patient have difficulty dressing or bathing?: No Does the patient have difficulty doing errands alone such as visiting a doctor's office or shopping?: No    Assessment & Plan  1. Upper respiratory infection  Advised saline spray, rest, continue Allegra, add Flonase, it will take time to feel better - Dextromethorphan-Guaifenesin (CORICIDIN HBP CONGESTION/COUGH) 10-200 MG CAPS;  Take 1 capsule by mouth 4 (four) times daily.  Dispense: 1 each; Refill: 0 - chlorpheniramine-HYDROcodone (TUSSIONEX PENNKINETIC ER) 10-8 MG/5ML SUER; Take 5 mLs by mouth every 12 (twelve) hours as needed for cough.  Dispense: 140 mL; Refill: 0 - benzonatate (TESSALON) 100 MG capsule; Take 1-2 capsules (100-200 mg total) by mouth 3 (three) times daily as needed.  Dispense: 40 capsule; Refill: 0 - fluticasone  (FLONASE) 50 MCG/ACT nasal spray; Place 2 sprays into both nostrils daily.  Dispense: 16 g; Refill: 0

## 2015-09-10 NOTE — Telephone Encounter (Signed)
Patient was informed of Dr. Ancil Boozer message, but she wanted to be seen, so she has an appt for 1:20pm today.

## 2015-09-21 DIAGNOSIS — L851 Acquired keratosis [keratoderma] palmaris et plantaris: Secondary | ICD-10-CM | POA: Diagnosis not present

## 2015-09-21 DIAGNOSIS — B351 Tinea unguium: Secondary | ICD-10-CM | POA: Diagnosis not present

## 2015-09-21 DIAGNOSIS — E1142 Type 2 diabetes mellitus with diabetic polyneuropathy: Secondary | ICD-10-CM | POA: Diagnosis not present

## 2015-09-28 ENCOUNTER — Telehealth: Payer: Self-pay | Admitting: Family Medicine

## 2015-09-28 ENCOUNTER — Other Ambulatory Visit: Payer: Self-pay | Admitting: Family Medicine

## 2015-09-28 NOTE — Telephone Encounter (Signed)
Pt needs refill Isosorbide 60 mg to be sent to [address]for 30 days.

## 2015-10-26 ENCOUNTER — Telehealth: Payer: Self-pay | Admitting: Family Medicine

## 2015-10-26 NOTE — Telephone Encounter (Signed)
Pt is asking for a refill on her cough medicine . Says it is the Benzonatate 100mg  2caplets 3x a day as needed.

## 2015-10-26 NOTE — Telephone Encounter (Signed)
She should be much better by now, if not she needs to be seen. Benzonate is to be used for short term use

## 2015-10-27 NOTE — Telephone Encounter (Signed)
MADE APPT FOR 10-28-15

## 2015-10-28 ENCOUNTER — Ambulatory Visit: Payer: Commercial Managed Care - HMO | Admitting: Family Medicine

## 2015-11-01 ENCOUNTER — Encounter: Payer: Self-pay | Admitting: Family Medicine

## 2015-11-01 ENCOUNTER — Ambulatory Visit (INDEPENDENT_AMBULATORY_CARE_PROVIDER_SITE_OTHER): Payer: Commercial Managed Care - HMO | Admitting: Family Medicine

## 2015-11-01 VITALS — BP 118/82 | HR 86 | Temp 97.7°F | Resp 16 | Wt 215.8 lb

## 2015-11-01 DIAGNOSIS — E1122 Type 2 diabetes mellitus with diabetic chronic kidney disease: Secondary | ICD-10-CM | POA: Diagnosis not present

## 2015-11-01 DIAGNOSIS — J0100 Acute maxillary sinusitis, unspecified: Secondary | ICD-10-CM | POA: Diagnosis not present

## 2015-11-01 DIAGNOSIS — E1165 Type 2 diabetes mellitus with hyperglycemia: Secondary | ICD-10-CM | POA: Diagnosis not present

## 2015-11-01 DIAGNOSIS — R05 Cough: Secondary | ICD-10-CM | POA: Diagnosis not present

## 2015-11-01 DIAGNOSIS — I209 Angina pectoris, unspecified: Secondary | ICD-10-CM | POA: Diagnosis not present

## 2015-11-01 DIAGNOSIS — N183 Chronic kidney disease, stage 3 unspecified: Secondary | ICD-10-CM

## 2015-11-01 DIAGNOSIS — I10 Essential (primary) hypertension: Secondary | ICD-10-CM | POA: Diagnosis not present

## 2015-11-01 DIAGNOSIS — G4730 Sleep apnea, unspecified: Secondary | ICD-10-CM | POA: Diagnosis not present

## 2015-11-01 DIAGNOSIS — R059 Cough, unspecified: Secondary | ICD-10-CM

## 2015-11-01 DIAGNOSIS — F330 Major depressive disorder, recurrent, mild: Secondary | ICD-10-CM | POA: Diagnosis not present

## 2015-11-01 DIAGNOSIS — E785 Hyperlipidemia, unspecified: Secondary | ICD-10-CM | POA: Diagnosis not present

## 2015-11-01 DIAGNOSIS — IMO0002 Reserved for concepts with insufficient information to code with codable children: Secondary | ICD-10-CM

## 2015-11-01 DIAGNOSIS — D509 Iron deficiency anemia, unspecified: Secondary | ICD-10-CM | POA: Diagnosis not present

## 2015-11-01 DIAGNOSIS — E1143 Type 2 diabetes mellitus with diabetic autonomic (poly)neuropathy: Secondary | ICD-10-CM | POA: Diagnosis not present

## 2015-11-01 DIAGNOSIS — K3184 Gastroparesis: Secondary | ICD-10-CM

## 2015-11-01 LAB — POCT GLYCOSYLATED HEMOGLOBIN (HGB A1C): Hemoglobin A1C: 8.1

## 2015-11-01 MED ORDER — BENZONATATE 100 MG PO CAPS: 100.0000 mg | ORAL_CAPSULE | Freq: Three times a day (TID) | ORAL | Status: DC | PRN

## 2015-11-01 MED ORDER — AMOXICILLIN-POT CLAVULANATE 875-125 MG PO TABS: 1.0000 | ORAL_TABLET | Freq: Two times a day (BID) | ORAL | Status: DC

## 2015-11-01 NOTE — Progress Notes (Signed)
Name: Dana Sparks   MRN: [id number]    DOB: [birth year]   Date:11/01/2015       Progress Note  Subjective  Chief Complaint  Chief Complaint  Patient presents with  . Depression  . Diabetes  . Hypertension  . Hyperlipidemia  . Apnea  . Chronic Kidney Disease  . Cough    productive at times  . Medication Refill    HPI  Anemia: : she was diagnosed earlier 2016 year with h. Pylori, she was having a lot of dyspepsia. We tried treating her with antibiotics but she was unable to tolerate it. She was referred to Dr. Durwin Reges and Oncologist because of severe anemia. EGD was neg, she is taking ferrous sulfate twice daily and she states she has been compliant. No longer has epigastric pain. Dr. Mike Gip has recommended labs every 3 months, I will check it every time patient comes in for follow up. She denies Pica  DMII with gastroparesis/CKI: she is taking medication as prescribed,has not been checking glucose at home. Her hgbA1C is up from last visit and no longer at goal. She wants to resume a diabetic diet before changing medication.. She denies hypoglycemia, no polyphagia or polydipsia, or polyuria. She is on Ace for diabetic CKI, states gastroparesis symptoms are controlled - not on medication. Frozen shoulder on right side, improved with home PT  HTN: at goal, no side effects of medication. No chest pain or palpitation.   Angina: CAD, her cardiologist is Dr. Clayborn Bigness, taking Imdur daily and has not chest pain at this time. She is also on statin therapy and ace. Not on a beta blocker.   Cough: she was treated for an URI back in January, symptoms resolved, but states that over the past week she has noticed some right side sore throat, cough , no nasal congestion, sneezing, but has post-nasal drainage. She has no fever, she would like refill of Tessalon perles. She used to smoke, but quit in 2015.   OSA: not using a CPAP machine, discussed increase risk of strokes and MI's without compliance, she  states night sweats are too severe for her to use the mask.   Major Depression Mild: she denies crying spells. She has some fatigue, also anhedonia, she is taking antidepressants - Citalopram, and states symptoms are mild and does not want to change medication   Patient Active Problem List   Diagnosis Date Noted  . Angina pectoris ([place]) 11/01/2015  . Morbid obesity ([place]) 08/05/2015  . Well controlled type 2 diabetes mellitus with gastroparesis ([place]) 06/22/2015  . Low TSH level 06/22/2015  . Iron deficiency anemia 04/07/2015  . Intestinal metaplasia of gastric mucosa 03/11/2015  . CAD (coronary artery disease), native coronary artery 02/24/2015  . History of coronary artery stent placement 02/24/2015  . Chronic kidney disease, stage 3, mod decreased GFR 02/24/2015  . Menopause 02/24/2015  . History of Helicobacter pylori infection 02/24/2015  . Mild mitral insufficiency 02/24/2015  . Mild tricuspid insufficiency 02/24/2015  . 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 ([place]) 02/24/2015  . Controlled gout 02/24/2015  . Inflammatory arthritis ([place]) 02/24/2015  . Depression, major, recurrent, mild ([place]) 02/24/2015  . Morbid obesity due to excess calories ([place]) 02/24/2015  . Mild pulmonary hypertension ([place]) 02/24/2015  . Gastroesophageal reflux disease without esophagitis 02/24/2015  . Perennial allergic rhinitis 02/24/2015  . HLD (hyperlipidemia) 02/20/2015  . Hypertension, benign 02/20/2015  . Apnea, sleep 02/20/2015  . Controlled diabetes mellitus with stage 3 chronic kidney  disease, without long-term current use of insulin ([place]) 02/20/2015  . Thrombocytosis ([place]) 01/28/2015    Past Surgical History  Procedure Laterality Date  . Abdominal hysterectomy      total  . Total knee arthroplasty Right   . Cataract extraction    . Coronary angioplasty with stent placement    . Esophagogastroduodenoscopy N/A 02/16/2015    negative h pylori, focal gastic  intestinal metaplasia  . Colonoscopy  01/2014    sigmoid diverticulosis. desc colon TA, hyperplastic polyp    Family History  Problem Relation Age of Onset  . Cancer Sister     unsure  . Alcohol abuse Brother   . Colon cancer Neg Hx   . Liver disease Neg Hx     Social History   Social History  . Marital Status: Married    Spouse Name: N/A  . Number of Children: N/A  . Years of Education: N/A   Occupational History  . Not on file.   Social History Main Topics  . Smoking status: Former Smoker -- 1.00 packs/day for 40 years    Types: Cigarettes    Quit date: 12/30/2012  . Smokeless tobacco: Never Used  . Alcohol Use: No  . Drug Use: No  . Sexual Activity: Not Currently   Other Topics Concern  . Not on file   Social History Narrative     Current outpatient prescriptions:  .  allopurinol (ZYLOPRIM) 100 MG tablet, Take 1 tablet (100 mg total) by mouth daily., Disp: 90 tablet, Rfl: 3 .  amLODipine (NORVASC) 5 MG tablet, Take 1 tablet (5 mg total) by mouth daily., Disp: 90 tablet, Rfl: 3 .  aspirin EC 81 MG tablet, Take 81 mg by mouth daily., Disp: , Rfl:  .  atorvastatin (LIPITOR) 40 MG tablet, Take 1 tablet (40 mg total) by mouth daily., Disp: 90 tablet, Rfl: 3 .  benzonatate (TESSALON) 100 MG capsule, Take 1-2 capsules (100-200 mg total) by mouth 3 (three) times daily as needed., Disp: 60 capsule, Rfl: 0 .  citalopram (CELEXA) 20 MG tablet, Take 1 tablet (20 mg total) by mouth daily., Disp: 90 tablet, Rfl: 3 .  estradiol (ESTRACE) 0.5 MG tablet, Take 1 tablet (0.5 mg total) by mouth daily., Disp: 90 tablet, Rfl: 3 .  ferrous sulfate 325 (65 FE) MG tablet, Take 1 tablet (325 mg total) by mouth daily with breakfast. (Patient taking differently: Take 325 mg by mouth 2 (two) times daily with a meal. ), Disp: 90 tablet, Rfl: 3 .  fexofenadine (ALLEGRA) 180 MG tablet, Take 180 mg by mouth daily., Disp: , Rfl:  .  fluticasone (FLONASE) 50 MCG/ACT nasal spray, Place 2 sprays into  both nostrils daily., Disp: 16 g, Rfl: 0 .  glipiZIDE (GLUCOTROL) 5 MG tablet, TAKE 2 TABLETS EVERY MORNING  AND TAKE 1 TABLET EVERY EVENING, Disp: 270 tablet, Rfl: 0 .  isosorbide mononitrate (IMDUR) 60 MG 24 hr tablet, TAKE 1 TABLET EVERY DAY, Disp: 90 tablet, Rfl: 1 .  JANUMET 50-1000 MG tablet, TAKE 1 TABLET TWICE DAILY, Disp: 120 tablet, Rfl: 1 .  lisinopril (PRINIVIL,ZESTRIL) 40 MG tablet, Take 1 tablet (40 mg total) by mouth 2 (two) times daily., Disp: 180 tablet, Rfl: 3 .  metoprolol succinate (TOPROL-XL) 50 MG 24 hr tablet, Take 1 tablet (50 mg total) by mouth daily., Disp: 90 tablet, Rfl: 3 .  pantoprazole (PROTONIX) 40 MG tablet, Take 1 tablet (40 mg total) by mouth every morning., Disp: 90 tablet, Rfl: 3 .  potassium chloride (K-DUR,KLOR-CON) 10 MEQ tablet, Take 1 tablet (10 mEq total) by mouth daily as needed., Disp: 90 tablet, Rfl: 3 .  torsemide (DEMADEX) 20 MG tablet, Take 1 tablet (20 mg total) by mouth daily. (Patient taking differently: Take 20 mg by mouth as needed. ), Disp: 90 tablet, Rfl: 3  Allergies  Allergen Reactions  . Codeine   . Contrast Media [Iodinated Diagnostic Agents] Itching     ROS  Constitutional: Negative for fever or weight change.  Respiratory: Positive for cough but no  shortness of breath.   Cardiovascular: Negative for chest pain or palpitations.  Gastrointestinal: Negative for abdominal pain, no bowel changes.  Musculoskeletal: Negative for gait problem or joint swelling.  Skin: Negative for rash.  Neurological: Negative for dizziness or headache.  No other specific complaints in a complete review of systems (except as listed in HPI above). Objective  Filed Vitals:   11/01/15 1015  BP: 118/82  Pulse: 86  Temp: 97.7 F (36.5 C)  TempSrc: Oral  Resp: 16  Weight: 215 lb 12.8 oz (97.886 kg)  SpO2: 95%    Body mass index is 35.35 kg/(m^2).  Physical Exam  Constitutional: Patient appears well-developed and well-nourished. Obese  No  distress.  HEENT: head atraumatic, normocephalic, pupils equal and reactive to light, ears TM,neck supple, throat within normal limits. Tender during percussion of left maxillary sinus, boggy turbinates Cardiovascular: Normal rate, regular rhythm and normal heart sounds.  No murmur heard. No BLE edema. Pulmonary/Chest: Effort normal and breath sounds normal. No respiratory distress.  Abdominal: Soft.  There is no tenderness. Psychiatric: Patient has a normal mood and affect. behavior is normal. Judgment and thought content normal.  Recent Results (from the past 2160 hour(s))  TSH     Status: None   Collection Time: 08/05/15 11:32 AM  Result Value Ref Range   TSH 0.525 0.450 - 4.500 uIU/mL  POCT glycosylated hemoglobin (Hb A1C)     Status: None   Collection Time: 11/01/15 10:26 AM  Result Value Ref Range   Hemoglobin A1C 8.1      PHQ2/9: Depression screen [facility] 2/9 11/01/2015 09/10/2015 06/28/2015 02/24/2015  Decreased Interest 0 0 0 0  Down, Depressed, Hopeless 0 0 0 0  PHQ - 2 Score 0 0 0 0     Fall Risk: Fall Risk  11/01/2015 09/10/2015 06/28/2015 02/24/2015  Falls in the past year? No No No No    Functional Status Survey: Is the patient deaf or have difficulty hearing?: No Does the patient have difficulty seeing, even when wearing glasses/contacts?: No Does the patient have difficulty concentrating, remembering, or making decisions?: No Does the patient have difficulty walking or climbing stairs?: No Does the patient have difficulty dressing or bathing?: No Does the patient have difficulty doing errands alone such as visiting a doctor's office or shopping?: No    Assessment & Plan  1. Uncontrolled type 2 diabetes mellitus with stage 3 chronic kidney disease, without long-term current use of insulin ([place])  She will resume a diabetic diet, also advised to check glucose at home, return sooner if needed.  - POCT glycosylated hemoglobin (Hb A1C)  2. Hypertension, benign  At  goal   3. Chronic kidney disease, stage 3, mod decreased GFR  Improved, continue ACE  4. Depression, major, recurrent, mild ([place])  stable  5. Apnea, sleep  Discussed importance of CPAP use  6. Diabetic gastroparesis ([place])   7. HLD (hyperlipidemia)  Continue statin therapy   8. Cough  -  benzonatate (TESSALON) 100 MG capsule; Take 1-2 capsules (100-200 mg total) by mouth 3 (three) times daily as needed.  Dispense: 60 capsule; Refill: 0  9. Angina pectoris ([facility])  Continue Imdur  10. Iron deficiency anemia  - CBC with Differential/Platelet - Ferritin  11. Acute maxillary sinusitis, recurrence not specified  - amoxicillin-clavulanate (AUGMENTIN) 875-125 MG tablet; Take 1 tablet by mouth 2 (two) times daily.  Dispense: 20 tablet; Refill: 0

## 2015-11-01 NOTE — Addendum Note (Signed)
Addended by: Steele Sizer F on: 11/01/2015 11:04 AM   Modules accepted: Orders

## 2015-11-02 LAB — CBC WITH DIFFERENTIAL/PLATELET
BASOS ABS: 0 10*3/uL (ref 0.0–0.2)
Basos: 0 %
EOS (ABSOLUTE): 0.3 10*3/uL (ref 0.0–0.4)
Eos: 4 %
HEMATOCRIT: 38.5 % (ref 34.0–46.6)
Hemoglobin: 12.8 g/dL (ref 11.1–15.9)
Immature Grans (Abs): 0 10*3/uL (ref 0.0–0.1)
Immature Granulocytes: 0 %
LYMPHS ABS: 3.7 10*3/uL — AB (ref 0.7–3.1)
Lymphs: 53 %
MCH: 29.2 pg (ref 26.6–33.0)
MCHC: 33.2 g/dL (ref 31.5–35.7)
MCV: 88 fL (ref 79–97)
MONOS ABS: 0.5 10*3/uL (ref 0.1–0.9)
Monocytes: 6 %
NEUTROS PCT: 37 %
Neutrophils Absolute: 2.6 10*3/uL (ref 1.4–7.0)
PLATELETS: 357 10*3/uL (ref 150–379)
RBC: 4.39 x10E6/uL (ref 3.77–5.28)
RDW: 13.7 % (ref 12.3–15.4)
WBC: 7.1 10*3/uL (ref 3.4–10.8)

## 2015-11-02 LAB — FERRITIN: FERRITIN: 35 ng/mL (ref 15–150)

## 2015-11-09 ENCOUNTER — Telehealth: Payer: Self-pay | Admitting: Family Medicine

## 2015-11-09 NOTE — Telephone Encounter (Signed)
Patient was prescribed an antibotic on her last visit, she is now needing a prescription for diflucan. Please send to [facility]

## 2015-11-10 MED ORDER — FLUCONAZOLE 150 MG PO TABS: 150.0000 mg | ORAL_TABLET | ORAL | Status: DC

## 2015-11-10 NOTE — Telephone Encounter (Signed)
done

## 2015-11-22 ENCOUNTER — Other Ambulatory Visit: Payer: Self-pay

## 2015-11-22 DIAGNOSIS — N183 Chronic kidney disease, stage 3 unspecified: Secondary | ICD-10-CM

## 2015-11-22 DIAGNOSIS — M109 Gout, unspecified: Secondary | ICD-10-CM

## 2015-11-22 DIAGNOSIS — F330 Major depressive disorder, recurrent, mild: Secondary | ICD-10-CM

## 2015-11-22 DIAGNOSIS — E1122 Type 2 diabetes mellitus with diabetic chronic kidney disease: Secondary | ICD-10-CM

## 2015-11-22 DIAGNOSIS — R059 Cough, unspecified: Secondary | ICD-10-CM

## 2015-11-22 DIAGNOSIS — R05 Cough: Secondary | ICD-10-CM

## 2015-11-22 MED ORDER — ALLOPURINOL 100 MG PO TABS: 100.0000 mg | ORAL_TABLET | Freq: Every day | ORAL | Status: DC

## 2015-11-22 MED ORDER — ACCU-CHEK NANO SMARTVIEW W/DEVICE KIT: 1.0000 | PACK | Freq: Once | Status: DC

## 2015-11-22 MED ORDER — ACCU-CHEK FASTCLIX LANCET KIT: 1.0000 | PACK | Freq: Every day | Status: DC

## 2015-11-22 MED ORDER — GLUCOSE BLOOD VI STRP: ORAL_STRIP | Status: DC

## 2015-11-22 MED ORDER — ACCU-CHEK SMARTVIEW CONTROL VI LIQD: 1.0000 | Freq: Every day | Status: DC

## 2015-11-22 MED ORDER — CITALOPRAM HYDROBROMIDE 20 MG PO TABS: 20.0000 mg | ORAL_TABLET | Freq: Every day | ORAL | Status: DC

## 2015-11-22 NOTE — Telephone Encounter (Signed)
Patient requesting refill. 

## 2015-12-22 DIAGNOSIS — B351 Tinea unguium: Secondary | ICD-10-CM | POA: Diagnosis not present

## 2015-12-22 DIAGNOSIS — L851 Acquired keratosis [keratoderma] palmaris et plantaris: Secondary | ICD-10-CM | POA: Diagnosis not present

## 2015-12-22 DIAGNOSIS — E1142 Type 2 diabetes mellitus with diabetic polyneuropathy: Secondary | ICD-10-CM | POA: Diagnosis not present

## 2015-12-27 ENCOUNTER — Other Ambulatory Visit: Payer: Self-pay | Admitting: Family Medicine

## 2015-12-27 DIAGNOSIS — Z1231 Encounter for screening mammogram for malignant neoplasm of breast: Secondary | ICD-10-CM

## 2016-01-04 ENCOUNTER — Telehealth: Payer: Self-pay | Admitting: Family Medicine

## 2016-01-04 DIAGNOSIS — D509 Iron deficiency anemia, unspecified: Secondary | ICD-10-CM

## 2016-01-04 DIAGNOSIS — D75839 Thrombocytosis, unspecified: Secondary | ICD-10-CM

## 2016-01-04 DIAGNOSIS — D473 Essential (hemorrhagic) thrombocythemia: Secondary | ICD-10-CM

## 2016-01-04 NOTE — Telephone Encounter (Signed)
Referral is needed for [facility]. Seeing Dr Mike Gip. Diag code of D50.9 and D47.3. APPT Friday MAY 5 @ 2PM. Her number is on the front of encounter

## 2016-01-06 ENCOUNTER — Telehealth: Payer: Self-pay | Admitting: Family Medicine

## 2016-01-06 NOTE — Telephone Encounter (Signed)
Dr. Ancil Boozer, placed new referral on yesterday.

## 2016-01-06 NOTE — Telephone Encounter (Signed)
[facility] NEEDS REFERRAL . OLD ONE HAS EXPIRED. DIAG D50.9 D47.3

## 2016-01-06 NOTE — Telephone Encounter (Signed)
LATISHA , CHRISTINA SAID THAT IT IS NOT SHOWING IN THE SYSTEM. PLEASE CHECK WITH DR SOWLES TO SEE IF SOMETHING WAS MISSED.

## 2016-01-06 NOTE — Telephone Encounter (Signed)
Referral was corrected

## 2016-01-07 ENCOUNTER — Inpatient Hospital Stay: Payer: Commercial Managed Care - HMO

## 2016-01-07 ENCOUNTER — Inpatient Hospital Stay: Payer: Commercial Managed Care - HMO | Admitting: Hematology and Oncology

## 2016-01-24 ENCOUNTER — Inpatient Hospital Stay: Payer: Commercial Managed Care - HMO | Attending: Hematology and Oncology

## 2016-01-24 ENCOUNTER — Inpatient Hospital Stay (HOSPITAL_BASED_OUTPATIENT_CLINIC_OR_DEPARTMENT_OTHER): Payer: Commercial Managed Care - HMO | Admitting: Hematology and Oncology

## 2016-01-24 VITALS — BP 116/73 | HR 80 | Temp 95.6°F | Resp 18 | Wt 220.7 lb

## 2016-01-24 DIAGNOSIS — N2889 Other specified disorders of kidney and ureter: Secondary | ICD-10-CM

## 2016-01-24 DIAGNOSIS — E785 Hyperlipidemia, unspecified: Secondary | ICD-10-CM | POA: Insufficient documentation

## 2016-01-24 DIAGNOSIS — J449 Chronic obstructive pulmonary disease, unspecified: Secondary | ICD-10-CM

## 2016-01-24 DIAGNOSIS — D473 Essential (hemorrhagic) thrombocythemia: Secondary | ICD-10-CM | POA: Insufficient documentation

## 2016-01-24 DIAGNOSIS — Z809 Family history of malignant neoplasm, unspecified: Secondary | ICD-10-CM | POA: Insufficient documentation

## 2016-01-24 DIAGNOSIS — K3184 Gastroparesis: Secondary | ICD-10-CM | POA: Insufficient documentation

## 2016-01-24 DIAGNOSIS — Z86010 Personal history of colonic polyps: Secondary | ICD-10-CM | POA: Diagnosis not present

## 2016-01-24 DIAGNOSIS — Z87891 Personal history of nicotine dependence: Secondary | ICD-10-CM

## 2016-01-24 DIAGNOSIS — D509 Iron deficiency anemia, unspecified: Secondary | ICD-10-CM | POA: Insufficient documentation

## 2016-01-24 DIAGNOSIS — Z79899 Other long term (current) drug therapy: Secondary | ICD-10-CM

## 2016-01-24 DIAGNOSIS — Z7984 Long term (current) use of oral hypoglycemic drugs: Secondary | ICD-10-CM | POA: Insufficient documentation

## 2016-01-24 DIAGNOSIS — F419 Anxiety disorder, unspecified: Secondary | ICD-10-CM | POA: Diagnosis not present

## 2016-01-24 DIAGNOSIS — F329 Major depressive disorder, single episode, unspecified: Secondary | ICD-10-CM | POA: Diagnosis not present

## 2016-01-24 DIAGNOSIS — I2510 Atherosclerotic heart disease of native coronary artery without angina pectoris: Secondary | ICD-10-CM | POA: Insufficient documentation

## 2016-01-24 DIAGNOSIS — M109 Gout, unspecified: Secondary | ICD-10-CM

## 2016-01-24 DIAGNOSIS — M129 Arthropathy, unspecified: Secondary | ICD-10-CM | POA: Insufficient documentation

## 2016-01-24 DIAGNOSIS — G4730 Sleep apnea, unspecified: Secondary | ICD-10-CM | POA: Insufficient documentation

## 2016-01-24 DIAGNOSIS — D649 Anemia, unspecified: Secondary | ICD-10-CM

## 2016-01-24 DIAGNOSIS — E119 Type 2 diabetes mellitus without complications: Secondary | ICD-10-CM | POA: Diagnosis not present

## 2016-01-24 DIAGNOSIS — Z7982 Long term (current) use of aspirin: Secondary | ICD-10-CM

## 2016-01-24 LAB — CBC WITH DIFFERENTIAL/PLATELET
Basophils Absolute: 0 10*3/uL (ref 0–0.1)
Basophils Relative: 1 %
Eosinophils Absolute: 0.2 10*3/uL (ref 0–0.7)
Eosinophils Relative: 3 %
HCT: 34.5 % — ABNORMAL LOW (ref 35.0–47.0)
Hemoglobin: 11.3 g/dL — ABNORMAL LOW (ref 12.0–16.0)
Lymphocytes Relative: 46 %
Lymphs Abs: 3.5 10*3/uL (ref 1.0–3.6)
MCH: 29.4 pg (ref 26.0–34.0)
MCHC: 32.7 g/dL (ref 32.0–36.0)
MCV: 89.6 fL (ref 80.0–100.0)
Monocytes Absolute: 0.5 10*3/uL (ref 0.2–0.9)
Monocytes Relative: 7 %
Neutro Abs: 3.2 10*3/uL (ref 1.4–6.5)
Neutrophils Relative %: 43 %
Platelets: 331 10*3/uL (ref 150–440)
RBC: 3.85 MIL/uL (ref 3.80–5.20)
RDW: 14.2 % (ref 11.5–14.5)
WBC: 7.5 10*3/uL (ref 3.6–11.0)

## 2016-01-24 LAB — FERRITIN: Ferritin: 6 ng/mL — ABNORMAL LOW (ref 11–307)

## 2016-01-24 NOTE — Patient Instructions (Signed)
Ferrous sulfate 325 mg tablets.  Take 1 tablet with orange juice twice a day after eating.

## 2016-01-24 NOTE — Progress Notes (Signed)
[facility] day:  01/24/2016   Chief Complaint: Dana Sparks is a 65 y.o. female with iron deficiency anemia and reactive thrombocytosis who is seen for 6 month assessment.  HPI: The patient was last seen in the medical oncology clinic on 07/05/2015.  At that time, she felt well.  Exam was unremarkable. Hematocrit was 35.7, hemoglobin 11.7, and MCV 91.  Ferritin was 10.  We discussed continuation of oral iron (ferritin goal 100).  Labs on 11/01/2015 revealed a hematocrit of 38.5, hemoglobin 12.8, MCV 88, platelets 357,000, WBC 7100 with an ANC of 2600.  Ferritin was 35.  She notes that her diet is good.  She eats, but is still hungry.  She stopped taking her oral iron 1 month ago.  She denies any melena or hematochezia.  She has had ice pica for greater than 1 month.   Past Medical History  Diagnosis Date  . Diverticulitis   . Allergy   . Positive H. pylori test   . Renal insufficiency   . Gastroparesis   . Anxiety   . Sleep apnea   . COPD (chronic obstructive pulmonary disease) ([place])   . Gout   . Diabetes mellitus without complication ([place])   . Depression   . Arthritis   . Hypertension   . CAD (coronary artery disease)   . Hyperlipemia   . Thrombocytosis ([place]) 01/28/2015  . Tubular adenoma of colon 01/20/14    Past Surgical History  Procedure Laterality Date  . Abdominal hysterectomy      total  . Total knee arthroplasty Right   . Cataract extraction    . Coronary angioplasty with stent placement    . Esophagogastroduodenoscopy N/A 02/16/2015    negative h pylori, focal gastic intestinal metaplasia  . Colonoscopy  01/2014    sigmoid diverticulosis. desc colon TA, hyperplastic polyp    Family History  Problem Relation Age of Onset  . Cancer Sister     unsure  . Alcohol abuse Brother   . Colon cancer Neg Hx   . Liver disease Neg Hx     Social History:  reports that she quit smoking about 3 years ago. Her smoking use  included Cigarettes. She has a 40 pack-year smoking history. She has never used smokeless tobacco. She reports that she does not drink alcohol or use illicit drugs.  The patient is alone today.  Allergies:  Allergies  Allergen Reactions  . Codeine   . Contrast Media [Iodinated Diagnostic Agents] Itching    Current Medications: Current Outpatient Prescriptions  Medication Sig Dispense Refill  . allopurinol (ZYLOPRIM) 100 MG tablet Take 1 tablet (100 mg total) by mouth daily. 90 tablet 3  . amLODipine (NORVASC) 5 MG tablet Take 1 tablet (5 mg total) by mouth daily. 90 tablet 3  . aspirin EC 81 MG tablet Take 81 mg by mouth daily.    Marland Kitchen atorvastatin (LIPITOR) 40 MG tablet Take 1 tablet (40 mg total) by mouth daily. 90 tablet 3  . citalopram (CELEXA) 20 MG tablet Take 1 tablet (20 mg total) by mouth daily. 90 tablet 3  . estradiol (ESTRACE) 0.5 MG tablet Take 1 tablet (0.5 mg total) by mouth daily. 90 tablet 3  . ferrous sulfate 325 (65 FE) MG tablet Take 1 tablet (325 mg total) by mouth daily with breakfast. (Patient taking differently: Take 325 mg by mouth 2 (two) times daily with a meal. ) 90 tablet 3  . fexofenadine (ALLEGRA)  180 MG tablet Take 180 mg by mouth daily.    . fluticasone (FLONASE) 50 MCG/ACT nasal spray Place 2 sprays into both nostrils daily. 16 g 0  . glipiZIDE (GLUCOTROL) 5 MG tablet TAKE 2 TABLETS EVERY MORNING  AND TAKE 1 TABLET EVERY EVENING 270 tablet 0  . isosorbide mononitrate (IMDUR) 60 MG 24 hr tablet TAKE 1 TABLET EVERY DAY 90 tablet 1  . JANUMET 50-1000 MG tablet TAKE 1 TABLET TWICE DAILY 120 tablet 1  . lisinopril (PRINIVIL,ZESTRIL) 40 MG tablet Take 1 tablet (40 mg total) by mouth 2 (two) times daily. 180 tablet 3  . loratadine (CLARITIN) 10 MG tablet Take 1 tablet by mouth daily.    . metoprolol succinate (TOPROL-XL) 50 MG 24 hr tablet Take 1 tablet (50 mg total) by mouth daily. 90 tablet 3  . pantoprazole (PROTONIX) 40 MG tablet Take 1 tablet (40 mg total) by  mouth every morning. 90 tablet 3  . potassium chloride (K-DUR,KLOR-CON) 10 MEQ tablet Take 1 tablet (10 mEq total) by mouth daily as needed. 90 tablet 3  . torsemide (DEMADEX) 20 MG tablet Take 1 tablet (20 mg total) by mouth daily. (Patient taking differently: Take 20 mg by mouth as needed. ) 90 tablet 3   No current facility-administered medications for this visit.    Review of Systems:  GENERAL:  Feels fine.  No problems.  No fevers or sweats.  Weight up 3 pounds. PERFORMANCE STATUS (ECOG):  1 HEENT:  No visual changes, runny nose, sore throat, mouth sores or tenderness. Lungs: No shortness of breath or cough.  No hemoptysis. Cardiac:  No chest pain, palpitations, orthopnea, or PND. GI:  No nausea, vomiting, diarrhea, constipation, melena or hematochezia. GU:  No urgency, frequency, dysuria, or hematuria. Musculoskeletal:  No back pain.  No joint pain.  No muscle tenderness. Extremities:  No pain or swelling. Skin:  No rashes or skin changes. Neuro:  No headache, numbness or weakness, balance or coordination issues. Endocrine:  Diabetes.  No thyroid issues, hot flashes or night sweats. Psych:  No mood changes, depression or anxiety. Pain:  No focal pain. Review of systems:  All other systems reviewed and found to be negative.  Physical Exam: Blood pressure 116/73, pulse 80, temperature 95.6 F (35.3 C), temperature source Tympanic, resp. rate 18, weight 220 lb 10.9 oz (100.1 kg). GENERAL:  Well developed, well nourished, sitting comfortably in the exam room in no acute distress. MENTAL STATUS:  Alert and oriented to person, place and time. HEAD:  Long black hair with graying.  Normocephalic, atraumatic, face symmetric, no Cushingoid features. EYES:  Glasses.  Brown eyes.  Pupils equal round and reactive to light and accomodation.  No conjunctivitis or scleral icterus. ENT:  Oropharynx clear without lesion.  Tongue normal. Mucous membranes moist.  RESPIRATORY:  Clear to auscultation  without rales, wheezes or rhonchi. CARDIOVASCULAR:  Regular rate and rhythm without murmur, rub or gallop. ABDOMEN:  Soft, non-tender, with active bowel sounds, and no appreciable hepatosplenomegaly.  No masses. SKIN:  No rashes, ulcers or lesions. EXTREMITIES: No edema, no skin discoloration or tenderness.  No palpable cords. LYMPH NODES: No palpable cervical, supraclavicular, axillary or inguinal adenopathy  NEUROLOGICAL: Unremarkable. PSYCH:  Appropriate.  Appointment on 01/24/2016  Component Date Value Ref Range Status  . WBC 01/24/2016 7.5  3.6 - 11.0 K/uL Final  . RBC 01/24/2016 3.85  3.80 - 5.20 MIL/uL Final  . Hemoglobin 01/24/2016 11.3* 12.0 - 16.0 g/dL Final  . HCT 01/24/2016  34.5* 35.0 - 47.0 % Final  . MCV 01/24/2016 89.6  80.0 - 100.0 fL Final  . MCH 01/24/2016 29.4  26.0 - 34.0 pg Final  . MCHC 01/24/2016 32.7  32.0 - 36.0 g/dL Final  . RDW 01/24/2016 14.2  11.5 - 14.5 % Final  . Platelets 01/24/2016 331  150 - 440 K/uL Final  . Neutrophils Relative % 01/24/2016 43   Final  . Neutro Abs 01/24/2016 3.2  1.4 - 6.5 K/uL Final  . Lymphocytes Relative 01/24/2016 46   Final  . Lymphs Abs 01/24/2016 3.5  1.0 - 3.6 K/uL Final  . Monocytes Relative 01/24/2016 7   Final  . Monocytes Absolute 01/24/2016 0.5  0.2 - 0.9 K/uL Final  . Eosinophils Relative 01/24/2016 3   Final  . Eosinophils Absolute 01/24/2016 0.2  0 - 0.7 K/uL Final  . Basophils Relative 01/24/2016 1   Final  . Basophils Absolute 01/24/2016 0.0  0 - 0.1 K/uL Final  . Ferritin 01/24/2016 6* 11 - 307 ng/mL Final    Assessment:  JEZLYN BEUCLER is a 65 y.o. female with iron deficiency anemia and reactive thrombocytosis.  She had positive H pylori serologies and breath test.  She completed an antibiotics course.   Her diet is good.  She denies any GI bleeding (melena or hematochezia), vaginal or GU bleeding.   Colonoscopy on 01/20/2014 and revealed a 6 mm polyp in the descending colon, a 5 mm polyp in the sigmoid colon,  and diverticulosis.  Pathology was benign.  EGD on 02/16/2015 was normal.  Pathology revealed no active inflammation, dysplasia or malignancy.  Work-up on 12/22/2014 confirmed iron deficiency.  Labs included a hematocrit 29.5, hemoglobin 8.8, MCV 74, platelets 428,000 and white count 6800 with an ANC of 3332. Ferritin was 4 (low) with a TIBC of 518 (elevated). Iron saturation was 3%. Folate was 23.3. Guaiac cards were negative.   Diet is good.  She stopped taking oral iron 1 month ago.    Symptomatically, she notes ice pica for the past month.  She denies any melena or hematochezia.  Exam in unremarkable.  Hematocrit has decreased from 38.5 to 34.5 in the past 3 months.  Ferritin has decreased from 35 to 6.  1.  Labs today:  CBC witth diff, ferritin. 2.  Restart oral iron BID with OJ or vitamin C. Ferritin goal 100. 3.  Guaiac cards x 3. 4.  Preauth Venofer. 5.  RTC in 2 months for MD assessment and labs (CBC with diff, ferritin day before) +/- Venofer.   Lequita Asal, MD  01/24/2016, 4:20 PM

## 2016-01-24 NOTE — Progress Notes (Signed)
States is feeling well. Offers no complaints. 

## 2016-01-25 ENCOUNTER — Encounter: Payer: Self-pay | Admitting: Hematology and Oncology

## 2016-01-28 ENCOUNTER — Ambulatory Visit
Admission: RE | Admit: 2016-01-28 | Discharge: 2016-01-28 | Disposition: A | Discharge status: Home / Self Care | Payer: Commercial Managed Care - HMO | Source: Ambulatory Visit | Attending: Family Medicine | Admitting: Family Medicine

## 2016-01-28 ENCOUNTER — Other Ambulatory Visit: Payer: Self-pay | Admitting: Family Medicine

## 2016-01-28 DIAGNOSIS — Z1231 Encounter for screening mammogram for malignant neoplasm of breast: Secondary | ICD-10-CM | POA: Insufficient documentation

## 2016-02-02 ENCOUNTER — Ambulatory Visit (INDEPENDENT_AMBULATORY_CARE_PROVIDER_SITE_OTHER): Payer: Commercial Managed Care - HMO | Admitting: Family Medicine

## 2016-02-02 ENCOUNTER — Encounter: Payer: Self-pay | Admitting: Family Medicine

## 2016-02-02 VITALS — BP 134/76 | HR 85 | Temp 97.8°F | Resp 16 | Ht 66.0 in | Wt 220.0 lb

## 2016-02-02 DIAGNOSIS — L298 Other pruritus: Secondary | ICD-10-CM | POA: Diagnosis not present

## 2016-02-02 DIAGNOSIS — N765 Ulceration of vagina: Secondary | ICD-10-CM | POA: Diagnosis not present

## 2016-02-02 DIAGNOSIS — R300 Dysuria: Secondary | ICD-10-CM | POA: Diagnosis not present

## 2016-02-02 DIAGNOSIS — N898 Other specified noninflammatory disorders of vagina: Secondary | ICD-10-CM

## 2016-02-02 LAB — POCT WET PREP (WET MOUNT)

## 2016-02-02 LAB — POCT URINALYSIS DIPSTICK
Bilirubin, UA: NEGATIVE
Blood, UA: NEGATIVE
Glucose, UA: NEGATIVE
Ketones, UA: NEGATIVE
LEUKOCYTES UA: NEGATIVE
NITRITE UA: NEGATIVE
PROTEIN UA: NEGATIVE
SPEC GRAV UA: 1.02
UROBILINOGEN UA: NEGATIVE
pH, UA: 6

## 2016-02-02 MED ORDER — VALACYCLOVIR HCL 500 MG PO TABS: 500.0000 mg | ORAL_TABLET | Freq: Three times a day (TID) | ORAL | Status: DC

## 2016-02-02 NOTE — Progress Notes (Signed)
Name: Dana Sparks   MRN: [id number]    DOB: [birth year]   Date:02/02/2016       Progress Note  Subjective  Chief Complaint  Chief Complaint  Patient presents with  . Vaginal Itching    Onset-3 weeks, patient states her vaginal area has been itching and painful. Patient would like it checked out due to her symptoms lasting so long and feels like something is running outside of her but has nothing in her panties and feels like something is moving down in her vaginal area.    HPI  Vaginal itching and dysuria: she has DM and history of recurrent yeast infections, but this time it feels a little different. Her glucose at home has been well controlled fasting ( 117 -139/ the highest recently was 161) . She has also noticed some dysuria when she starts to void, no urinary urgency, but has nocturia - increased. She also has noticed irritation when showering ( she states she scrubs very hard ).    Patient Active Problem List   Diagnosis Date Noted  . Angina pectoris ([place]) 11/01/2015  . Morbid obesity ([place]) 08/05/2015  . Well controlled type 2 diabetes mellitus with gastroparesis ([place]) 06/22/2015  . Low TSH level 06/22/2015  . Iron deficiency anemia 04/07/2015  . Intestinal metaplasia of gastric mucosa 03/11/2015  . CAD (coronary artery disease), native coronary artery 02/24/2015  . History of coronary artery stent placement 02/24/2015  . Chronic kidney disease, stage 3, mod decreased GFR 02/24/2015  . Menopause 02/24/2015  . History of Helicobacter pylori infection 02/24/2015  . Mild mitral insufficiency 02/24/2015  . Mild tricuspid insufficiency 02/24/2015  . 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 ([place]) 02/24/2015  . Controlled gout 02/24/2015  . Inflammatory arthritis ([place]) 02/24/2015  . Depression, major, recurrent, mild ([place]) 02/24/2015  . Morbid obesity due to excess calories ([place]) 02/24/2015  . Mild pulmonary hypertension ([place]) 02/24/2015  .  Gastroesophageal reflux disease without esophagitis 02/24/2015  . Perennial allergic rhinitis 02/24/2015  . HLD (hyperlipidemia) 02/20/2015  . Hypertension, benign 02/20/2015  . Apnea, sleep 02/20/2015  . Controlled diabetes mellitus with stage 3 chronic kidney disease, without long-term current use of insulin ([place]) 02/20/2015  . Thrombocytosis ([place]) 01/28/2015    Past Surgical History  Procedure Laterality Date  . Abdominal hysterectomy      total  . Total knee arthroplasty Right   . Cataract extraction    . Coronary angioplasty with stent placement    . Esophagogastroduodenoscopy N/A 02/16/2015    negative h pylori, focal gastic intestinal metaplasia  . Colonoscopy  01/2014    sigmoid diverticulosis. desc colon TA, hyperplastic polyp    Family History  Problem Relation Age of Onset  . Cancer Sister     unsure  . Alcohol abuse Brother   . Colon cancer Neg Hx   . Liver disease Neg Hx   . Breast cancer Neg Hx     Social History   Social History  . Marital Status: Married    Spouse Name: N/A  . Number of Children: N/A  . Years of Education: N/A   Occupational History  . Not on file.   Social History Main Topics  . Smoking status: Former Smoker -- 1.00 packs/day for 40 years    Types: Cigarettes    Quit date: 12/30/2012  . Smokeless tobacco: Never Used  . Alcohol Use: No  . Drug Use: No  . Sexual Activity: Not Currently   Other Topics  Concern  . Not on file   Social History Narrative     Current outpatient prescriptions:  .  allopurinol (ZYLOPRIM) 100 MG tablet, Take 1 tablet (100 mg total) by mouth daily., Disp: 90 tablet, Rfl: 3 .  amLODipine (NORVASC) 5 MG tablet, Take 1 tablet (5 mg total) by mouth daily., Disp: 90 tablet, Rfl: 3 .  aspirin EC 81 MG tablet, Take 81 mg by mouth daily., Disp: , Rfl:  .  atorvastatin (LIPITOR) 40 MG tablet, Take 1 tablet (40 mg total) by mouth daily., Disp: 90 tablet, Rfl: 3 .  citalopram (CELEXA) 20 MG tablet, Take 1  tablet (20 mg total) by mouth daily., Disp: 90 tablet, Rfl: 3 .  estradiol (ESTRACE) 0.5 MG tablet, Take 1 tablet (0.5 mg total) by mouth daily., Disp: 90 tablet, Rfl: 3 .  ferrous sulfate 325 (65 FE) MG tablet, Take 1 tablet (325 mg total) by mouth daily with breakfast. (Patient taking differently: Take 325 mg by mouth 2 (two) times daily with a meal. ), Disp: 90 tablet, Rfl: 3 .  fexofenadine (ALLEGRA) 180 MG tablet, Take 180 mg by mouth daily., Disp: , Rfl:  .  fluticasone (FLONASE) 50 MCG/ACT nasal spray, Place 2 sprays into both nostrils daily., Disp: 16 g, Rfl: 0 .  glipiZIDE (GLUCOTROL) 5 MG tablet, TAKE 2 TABLETS EVERY MORNING  AND TAKE 1 TABLET EVERY EVENING, Disp: 270 tablet, Rfl: 0 .  isosorbide mononitrate (IMDUR) 60 MG 24 hr tablet, TAKE 1 TABLET EVERY DAY, Disp: 90 tablet, Rfl: 1 .  JANUMET 50-1000 MG tablet, TAKE 1 TABLET TWICE DAILY, Disp: 120 tablet, Rfl: 1 .  lisinopril (PRINIVIL,ZESTRIL) 40 MG tablet, Take 1 tablet (40 mg total) by mouth 2 (two) times daily., Disp: 180 tablet, Rfl: 3 .  loratadine (CLARITIN) 10 MG tablet, Take 1 tablet by mouth daily., Disp: , Rfl:  .  metoprolol succinate (TOPROL-XL) 50 MG 24 hr tablet, Take 1 tablet (50 mg total) by mouth daily., Disp: 90 tablet, Rfl: 3 .  pantoprazole (PROTONIX) 40 MG tablet, Take 1 tablet (40 mg total) by mouth every morning., Disp: 90 tablet, Rfl: 3 .  potassium chloride (K-DUR,KLOR-CON) 10 MEQ tablet, Take 1 tablet (10 mEq total) by mouth daily as needed., Disp: 90 tablet, Rfl: 3 .  torsemide (DEMADEX) 20 MG tablet, Take 1 tablet (20 mg total) by mouth daily. (Patient taking differently: Take 20 mg by mouth as needed. ), Disp: 90 tablet, Rfl: 3  Allergies  Allergen Reactions  . Codeine   . Contrast Media [Iodinated Diagnostic Agents] Itching     ROS  Ten systems reviewed and is negative except as mentioned in HPI   Objective  Filed Vitals:   02/02/16 0912  BP: 134/76  Pulse: 85  Temp: 97.8 F (36.6 C)   TempSrc: Oral  Resp: 16  Height: 5\' 6"  (1.676 m)  Weight: 220 lb (99.791 kg)  SpO2: 94%    Body mass index is 35.53 kg/(m^2).  Physical Exam  Constitutional: Patient appears well-developed and well-nourished. Obese  No distress.  HEENT: head atraumatic, normocephalic, pupils equal and reactive to light,neck supple, throat within normal limits Cardiovascular: Normal rate, regular rhythm and normal heart sounds.  No murmur heard. No BLE edema. Pulmonary/Chest: Effort normal and breath sounds normal. No respiratory distress. GYN: 4 mm round ulceration on posterior fourchet, tender to touch, scant vaginal discharge, no odor, normal bimanual exam Abdominal: Soft.  There is no tenderness. Psychiatric: Patient has a normal mood and affect.  behavior is normal. Judgment and thought content normal.  Recent Results (from the past 2160 hour(s))  CBC with Differential     Status: Abnormal   Collection Time: 01/24/16  1:47 PM  Result Value Ref Range   WBC 7.5 3.6 - 11.0 K/uL   RBC 3.85 3.80 - 5.20 MIL/uL   Hemoglobin 11.3 (L) 12.0 - 16.0 g/dL   HCT 34.5 (L) 35.0 - 47.0 %   MCV 89.6 80.0 - 100.0 fL   MCH 29.4 26.0 - 34.0 pg   MCHC 32.7 32.0 - 36.0 g/dL   RDW 14.2 11.5 - 14.5 %   Platelets 331 150 - 440 K/uL   Neutrophils Relative % 43 %   Neutro Abs 3.2 1.4 - 6.5 K/uL   Lymphocytes Relative 46 %   Lymphs Abs 3.5 1.0 - 3.6 K/uL   Monocytes Relative 7 %   Monocytes Absolute 0.5 0.2 - 0.9 K/uL   Eosinophils Relative 3 %   Eosinophils Absolute 0.2 0 - 0.7 K/uL   Basophils Relative 1 %   Basophils Absolute 0.0 0 - 0.1 K/uL  Ferritin     Status: Abnormal   Collection Time: 01/24/16  1:47 PM  Result Value Ref Range   Ferritin 6 (L) 11 - 307 ng/mL  POCT Urinalysis Dipstick     Status: Normal   Collection Time: 02/02/16  9:37 AM  Result Value Ref Range   Color, UA dark yellow    Clarity, UA clear    Glucose, UA neg    Bilirubin, UA neg    Ketones, UA neg    Spec Grav, UA 1.020     Blood, UA neg    pH, UA 6.0    Protein, UA negative    Urobilinogen, UA negative    Nitrite, UA neg    Leukocytes, UA Negative Negative      PHQ2/9: Depression screen [facility] 2/9 02/02/2016 11/01/2015 09/10/2015 06/28/2015 02/24/2015  Decreased Interest 0 0 0 0 0  Down, Depressed, Hopeless 0 0 0 0 0  PHQ - 2 Score 0 0 0 0 0     Fall Risk: Fall Risk  02/02/2016 11/01/2015 09/10/2015 06/28/2015 02/24/2015  Falls in the past year? No No No No No      Functional Status Survey: Is the patient deaf or have difficulty hearing?: No Does the patient have difficulty seeing, even when wearing glasses/contacts?: No Does the patient have difficulty concentrating, remembering, or making decisions?: No Does the patient have difficulty walking or climbing stairs?: No Does the patient have difficulty dressing or bathing?: No Does the patient have difficulty doing errands alone such as visiting a doctor's office or shopping?: No    Assessment & Plan  1. Vaginal itching  - POCT Urinalysis Dipstick - POCT Wet Prep [facility])  2. Dysuria  - Urine culture  3. Vaginal ulcer  Likely the cause of her symptoms, unknown etiology, but possibly herpes, not sexually active in the past few years - Herpes simplex virus culture - valACYclovir (VALTREX) 500 MG tablet; Take 1 tablet (500 mg total) by mouth 3 (three) times daily.  Dispense: 21 tablet; Refill: 0

## 2016-02-03 LAB — PLEASE NOTE

## 2016-02-03 LAB — URINE CULTURE

## 2016-02-05 LAB — HERPES SIMPLEX VIRUS CULTURE

## 2016-02-05 LAB — PLEASE NOTE

## 2016-02-12 DIAGNOSIS — D509 Iron deficiency anemia, unspecified: Secondary | ICD-10-CM | POA: Insufficient documentation

## 2016-02-13 DIAGNOSIS — D509 Iron deficiency anemia, unspecified: Secondary | ICD-10-CM | POA: Diagnosis not present

## 2016-02-14 DIAGNOSIS — D509 Iron deficiency anemia, unspecified: Secondary | ICD-10-CM | POA: Diagnosis not present

## 2016-02-16 ENCOUNTER — Other Ambulatory Visit: Payer: Self-pay | Admitting: *Deleted

## 2016-02-16 DIAGNOSIS — D509 Iron deficiency anemia, unspecified: Secondary | ICD-10-CM

## 2016-02-16 LAB — OCCULT BLOOD X 1 CARD TO LAB, STOOL
Fecal Occult Bld: NEGATIVE
Fecal Occult Bld: NEGATIVE
Fecal Occult Bld: NEGATIVE

## 2016-02-23 ENCOUNTER — Other Ambulatory Visit: Payer: Self-pay | Admitting: Family Medicine

## 2016-02-23 NOTE — Telephone Encounter (Signed)
Patient requesting refill. 

## 2016-03-13 ENCOUNTER — Encounter: Payer: Self-pay | Admitting: Family Medicine

## 2016-03-13 ENCOUNTER — Ambulatory Visit (INDEPENDENT_AMBULATORY_CARE_PROVIDER_SITE_OTHER): Payer: Commercial Managed Care - HMO | Admitting: Family Medicine

## 2016-03-13 VITALS — BP 134/78 | HR 77 | Temp 97.5°F | Resp 16 | Wt 223.0 lb

## 2016-03-13 DIAGNOSIS — F330 Major depressive disorder, recurrent, mild: Secondary | ICD-10-CM | POA: Diagnosis not present

## 2016-03-13 DIAGNOSIS — I209 Angina pectoris, unspecified: Secondary | ICD-10-CM

## 2016-03-13 DIAGNOSIS — E1143 Type 2 diabetes mellitus with diabetic autonomic (poly)neuropathy: Secondary | ICD-10-CM | POA: Diagnosis not present

## 2016-03-13 DIAGNOSIS — J320 Chronic maxillary sinusitis: Secondary | ICD-10-CM | POA: Diagnosis not present

## 2016-03-13 DIAGNOSIS — N183 Chronic kidney disease, stage 3 (moderate): Secondary | ICD-10-CM

## 2016-03-13 DIAGNOSIS — E785 Hyperlipidemia, unspecified: Secondary | ICD-10-CM

## 2016-03-13 DIAGNOSIS — Z780 Asymptomatic menopausal state: Secondary | ICD-10-CM | POA: Diagnosis not present

## 2016-03-13 DIAGNOSIS — E1122 Type 2 diabetes mellitus with diabetic chronic kidney disease: Secondary | ICD-10-CM

## 2016-03-13 DIAGNOSIS — L989 Disorder of the skin and subcutaneous tissue, unspecified: Secondary | ICD-10-CM | POA: Diagnosis not present

## 2016-03-13 DIAGNOSIS — D509 Iron deficiency anemia, unspecified: Secondary | ICD-10-CM

## 2016-03-13 LAB — POCT UA - MICROALBUMIN: Microalbumin Ur, POC: 50 mg/L

## 2016-03-13 LAB — POCT GLYCOSYLATED HEMOGLOBIN (HGB A1C): HEMOGLOBIN A1C: 6.7

## 2016-03-13 MED ORDER — FLUTICASONE PROPIONATE 50 MCG/ACT NA SUSP
2.0000 | Freq: Every day | NASAL | Status: DC
Start: 2016-03-13 — End: 2016-03-13

## 2016-03-13 MED ORDER — SITAGLIPTIN PHOS-METFORMIN HCL 50-1000 MG PO TABS: 1.0000 | ORAL_TABLET | Freq: Two times a day (BID) | ORAL | Status: DC

## 2016-03-13 MED ORDER — ESTROGENS, CONJUGATED 0.625 MG/GM VA CREA: 1.0000 | TOPICAL_CREAM | Freq: Every day | VAGINAL | Status: DC

## 2016-03-13 MED ORDER — FLUTICASONE PROPIONATE 50 MCG/ACT NA SUSP: 2.0000 | Freq: Every day | NASAL | Status: DC

## 2016-03-13 NOTE — Progress Notes (Signed)
Name: Dana Sparks   MRN: [id number]    DOB: Sparks 31, 1952   Date:03/13/2016       Progress Note  Subjective  Chief Complaint  Chief Complaint  Patient presents with  . Medication Refill    4 month F/U  . Depression  . Diabetes    patient checks blood sugar once a day. averages 130. lowest: 120 & highest: 200.  Marland Kitchen Hypertension  . Hyperlipidemia  . Sleep Apnea    HPI  Anemia: she was diagnosed earlier 2016 year with h. Pylori, she was having a lot of dyspepsia. We tried treating her with antibiotics but she was unable to tolerate it. She was referred to Dr. Durwin Reges and Oncologist because of severe anemia. EGD was neg, she is taking ferrous sulfate once daily and she states she has been compliant. No longer has epigastric pain. Dr. Mike Gip has recommended labs every 3 months, 3 hemoccult cards negative in  2017.   DMII with gastroparesis/CKI: she is taking medication as prescribed, has not been checking glucose at home. Her hgbA1C is back to goal, she is avoiding sweets, but is not exercising.  She denies hypoglycemia, no polyphagia or polydipsia, or polyuria. She is on Ace for diabetic CKI, urine micro is up to 50  states gastroparesis symptoms are controlled - not on medication. Frozen shoulder on right side, improved with home PT  HTN: at goal, no side effects of medication. No chest pain or palpitation.   Angina: CAD, her cardiologist is Dr. Clayborn Bigness, taking Imdur daily and Toprol XL and has not chest pain at this time. She is also on statin therapy and ace.   OSA: not using a CPAP machine, discussed increase risk of strokes and MI's without compliance, she states night sweats are too severe for her to use the mask.   Major Depression Mild: she denies crying spells. She has some fatigue, also anhedonia, she is taking antidepressants - Citalopram, and states symptoms are mild and does not want to change medication. She is not intimate with her husband - they don't sleep in the same room.  She states she does not care. It started 2 years ago.   Menopause: she has hot flashes, she has severe vaginal dryness, not sexually active ( husband has ED ) but feels like vaginal is dry and causes discomfort, she would like to try topical medication  Morbid obesity: she gained a few lbs since last visit, she is not sure why. Avoiding sweets, but still drinks sweet beverages intermittently. She has been eating a lot of vegetables.   Left Maxillary sinusitis: she has noticed pain on left sinus and feels congested. No fever or other cold symptoms. She has been out of flonase   Patient Active Problem List   Diagnosis Date Noted  . Angina pectoris ([place]) 11/01/2015  . Well controlled type 2 diabetes mellitus with gastroparesis ([place]) 06/22/2015  . Low TSH level 06/22/2015  . Iron deficiency anemia 04/07/2015  . Intestinal metaplasia of gastric mucosa 03/11/2015  . CAD (coronary artery disease), native coronary artery 02/24/2015  . History of coronary artery stent placement 02/24/2015  . Chronic kidney disease, stage 3, mod decreased GFR 02/24/2015  . Menopause 02/24/2015  . History of Helicobacter pylori infection 02/24/2015  . Mild mitral insufficiency 02/24/2015  . Mild tricuspid insufficiency 02/24/2015  . 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 ([place]) 02/24/2015  . Controlled gout 02/24/2015  . Depression, major, recurrent, mild ([place]) 02/24/2015  . Morbid obesity  due to excess calories ([place]) 02/24/2015  . Mild pulmonary hypertension ([place]) 02/24/2015  . Gastroesophageal reflux disease without esophagitis 02/24/2015  . Perennial allergic rhinitis 02/24/2015  . HLD (hyperlipidemia) 02/20/2015  . Hypertension, benign 02/20/2015  . Apnea, sleep 02/20/2015  . Controlled diabetes mellitus with stage 3 chronic kidney disease, without long-term current use of insulin ([place]) 02/20/2015  . Thrombocytosis ([place]) 01/28/2015    Past Surgical History  Procedure  Laterality Date  . Abdominal hysterectomy      total  . Total knee arthroplasty Right   . Cataract extraction    . Coronary angioplasty with stent placement    . Esophagogastroduodenoscopy N/A 02/16/2015    negative h pylori, focal gastic intestinal metaplasia  . Colonoscopy  01/2014    sigmoid diverticulosis. desc colon TA, hyperplastic polyp    Family History  Problem Relation Age of Onset  . Cancer Sister     unsure  . Alcohol abuse Brother   . Colon cancer Neg Hx   . Liver disease Neg Hx   . Breast cancer Neg Hx     Social History   Social History  . Marital Status: Married    Spouse Name: N/A  . Number of Children: N/A  . Years of Education: N/A   Occupational History  . Not on file.   Social History Main Topics  . Smoking status: Former Smoker -- 1.00 packs/day for 40 years    Types: Cigarettes    Quit date: 12/30/2012  . Smokeless tobacco: Never Used  . Alcohol Use: No  . Drug Use: No  . Sexual Activity: Not Currently   Other Topics Concern  . Not on file   Social History Narrative     Current outpatient prescriptions:  .  allopurinol (ZYLOPRIM) 100 MG tablet, Take 1 tablet (100 mg total) by mouth daily., Disp: 90 tablet, Rfl: 3 .  amLODipine (NORVASC) 5 MG tablet, TAKE 1 TABLET EVERY DAY, Disp: 90 tablet, Rfl: 1 .  aspirin EC 81 MG tablet, Take 81 mg by mouth daily., Disp: , Rfl:  .  atorvastatin (LIPITOR) 40 MG tablet, Take 1 tablet (40 mg total) by mouth daily., Disp: 90 tablet, Rfl: 3 .  citalopram (CELEXA) 20 MG tablet, Take 1 tablet (20 mg total) by mouth daily., Disp: 90 tablet, Rfl: 3 .  conjugated estrogens (PREMARIN) vaginal cream, Place 1 Applicatorful vaginally daily. First two weeks every night and after that twice weekly, Disp: 90 g, Rfl: 1 .  estradiol (ESTRACE) 0.5 MG tablet, Take 1 tablet (0.5 mg total) by mouth daily., Disp: 90 tablet, Rfl: 3 .  ferrous sulfate 325 (65 FE) MG tablet, Take 1 tablet (325 mg total) by mouth daily with  breakfast. (Patient taking differently: Take 325 mg by mouth 2 (two) times daily with a meal. ), Disp: 90 tablet, Rfl: 3 .  fexofenadine (ALLEGRA) 180 MG tablet, Take 180 mg by mouth daily., Disp: , Rfl:  .  fluticasone (FLONASE) 50 MCG/ACT nasal spray, Place 2 sprays into both nostrils daily., Disp: 48 g, Rfl: 1 .  glipiZIDE (GLUCOTROL) 5 MG tablet, TAKE 2 TABLETS EVERY MORNING  AND TAKE 1 TABLET EVERY EVENING, Disp: 270 tablet, Rfl: 0 .  isosorbide mononitrate (IMDUR) 60 MG 24 hr tablet, TAKE 1 TABLET EVERY DAY, Disp: 90 tablet, Rfl: 1 .  lisinopril (PRINIVIL,ZESTRIL) 40 MG tablet, TAKE 1 TABLET TWICE DAILY, Disp: 180 tablet, Rfl: 4 .  loratadine (CLARITIN) 10 MG tablet, Take 1 tablet by mouth daily., Disp: ,  Rfl:  .  metoprolol succinate (TOPROL-XL) 50 MG 24 hr tablet, Take 1 tablet (50 mg total) by mouth daily., Disp: 90 tablet, Rfl: 3 .  pantoprazole (PROTONIX) 40 MG tablet, Take 1 tablet (40 mg total) by mouth every morning., Disp: 90 tablet, Rfl: 3 .  potassium chloride (K-DUR,KLOR-CON) 10 MEQ tablet, Take 1 tablet (10 mEq total) by mouth daily as needed., Disp: 90 tablet, Rfl: 3 .  sitaGLIPtin-metformin (JANUMET) 50-1000 MG tablet, Take 1 tablet by mouth 2 (two) times daily., Disp: 120 tablet, Rfl: 1 .  torsemide (DEMADEX) 20 MG tablet, Take 1 tablet (20 mg total) by mouth daily. (Patient taking differently: Take 20 mg by mouth as needed. ), Disp: 90 tablet, Rfl: 3 .  valACYclovir (VALTREX) 500 MG tablet, Take 1 tablet (500 mg total) by mouth 3 (three) times daily., Disp: 21 tablet, Rfl: 0  Allergies  Allergen Reactions  . Codeine   . Contrast Media [Iodinated Diagnostic Agents] Itching     ROS  Ten systems reviewed and is negative except as mentioned in HPI Itchy arms at times  Objective  Filed Vitals:   03/13/16 0938  BP: 134/78  Pulse: 77  Temp: 97.5 F (36.4 C)  TempSrc: Oral  Resp: 16  Weight: 223 lb (101.152 kg)  SpO2: 98%    Body mass index is 36.01  kg/(m^2).  Physical Exam  Constitutional: Patient appears well-developed and well-nourished. Obese  No distress.  HEENT: head atraumatic, normocephalic, pupils equal and reactive to light,  neck supple, throat within normal limits, tender left maxillary sinus, mild post-nasal drainage Cardiovascular: Normal rate, regular rhythm and normal heart sounds.  No murmur heard. No BLE edema. Pulmonary/Chest: Effort normal and breath sounds normal. No respiratory distress. Abdominal: Soft.  There is no tenderness. Psychiatric: Patient has a normal mood and affect. behavior is normal. Judgment and thought content normal. Skin: two moles on right foot , one of them is changing  Recent Results (from the past 2160 hour(s))  CBC with Differential     Status: Abnormal   Collection Time: 01/24/16  1:47 PM  Result Value Ref Range   WBC 7.5 3.6 - 11.0 K/uL   RBC 3.85 3.80 - 5.20 MIL/uL   Hemoglobin 11.3 (L) 12.0 - 16.0 g/dL   HCT 34.5 (L) 35.0 - 47.0 %   MCV 89.6 80.0 - 100.0 fL   MCH 29.4 26.0 - 34.0 pg   MCHC 32.7 32.0 - 36.0 g/dL   RDW 14.2 11.5 - 14.5 %   Platelets 331 150 - 440 K/uL   Neutrophils Relative % 43 %   Neutro Abs 3.2 1.4 - 6.5 K/uL   Lymphocytes Relative 46 %   Lymphs Abs 3.5 1.0 - 3.6 K/uL   Monocytes Relative 7 %   Monocytes Absolute 0.5 0.2 - 0.9 K/uL   Eosinophils Relative 3 %   Eosinophils Absolute 0.2 0 - 0.7 K/uL   Basophils Relative 1 %   Basophils Absolute 0.0 0 - 0.1 K/uL  Ferritin     Status: Abnormal   Collection Time: 01/24/16  1:47 PM  Result Value Ref Range   Ferritin 6 (L) 11 - 307 ng/mL  Urine culture     Status: None   Collection Time: 02/02/16 12:00 AM  Result Value Ref Range   Urine Culture, Routine Final report    Urine Culture result 1 Comment     Comment: Mixed urogenital flora Less than 10,000 colonies/mL   Herpes simplex virus culture  Status: None   Collection Time: 02/02/16 12:00 AM  Result Value Ref Range   HSV Culture/Type Comment      Comment: Negative No Herpes simplex virus isolated.   Please Note     Status: None   Collection Time: 02/02/16 12:00 AM  Result Value Ref Range   Please note Comment     Comment: The date and/or time of collection was not indicated on the requisition as required by state and federal law.  The date of receipt of the specimen was used as the collection date if not supplied.   Please Note     Status: None   Collection Time: 02/02/16 12:00 AM  Result Value Ref Range   Please note Comment     Comment: The date and/or time of collection was not indicated on the requisition as required by state and federal law.  The date of receipt of the specimen was used as the collection date if not supplied.   POCT Urinalysis Dipstick     Status: Normal   Collection Time: 02/02/16  9:37 AM  Result Value Ref Range   Color, UA dark yellow    Clarity, UA clear    Glucose, UA neg    Bilirubin, UA neg    Ketones, UA neg    Spec Grav, UA 1.020    Blood, UA neg    pH, UA 6.0    Protein, UA negative    Urobilinogen, UA negative    Nitrite, UA neg    Leukocytes, UA Negative Negative  POCT Wet Prep [facility])     Status: Abnormal   Collection Time: 02/02/16 10:11 AM  Result Value Ref Range   Source Wet Prep POC vaginal    WBC, Wet Prep HPF POC none    Bacteria Wet Prep HPF POC None None, Few, Too numerous to count   BACTERIA WET PREP MORPHOLOGY POC     Clue Cells Wet Prep HPF POC Few (A) None, Too numerous to count   Clue Cells Wet Prep [place] POC     Yeast Wet Prep HPF POC Few    KOH Wet Prep POC     Trichomonas Wet Prep HPF POC none   Occult blood card to lab, stool     Status: None   Collection Time: 02/12/16  9:37 AM  Result Value Ref Range   Fecal Occult Bld NEGATIVE NEGATIVE  Occult blood card to lab, stool     Status: None   Collection Time: 02/13/16  8:58 AM  Result Value Ref Range   Fecal Occult Bld NEGATIVE NEGATIVE  Occult blood card to lab, stool     Status: None   Collection  Time: 02/14/16  9:29 AM  Result Value Ref Range   Fecal Occult Bld NEGATIVE NEGATIVE  POCT UA - Microalbumin     Status: Abnormal   Collection Time: 03/13/16  9:38 AM  Result Value Ref Range   Microalbumin Ur, POC 50 mg/L   Creatinine, POC  mg/dL   Albumin/Creatinine Ratio, Urine, POC    POCT HgB A1C     Status: Abnormal   Collection Time: 03/13/16  9:42 AM  Result Value Ref Range   Hemoglobin A1C 6.7     Diabetic Foot Exam: Diabetic Foot Exam - Simple   Simple Foot Form  Diabetic Foot exam was performed with the following findings:  Yes 03/13/2016 10:26 AM  Visual Inspection  No deformities, no ulcerations, no other skin breakdown bilaterally:  Yes  Sensation Testing  Intact to touch and monofilament testing bilaterally:  Yes  Pulse Check  Posterior Tibialis and Dorsalis pulse intact bilaterally:  Yes  Comments      PHQ2/9: Depression screen [facility] 2/9 03/13/2016 02/02/2016 11/01/2015 09/10/2015 06/28/2015  Decreased Interest 0 0 0 0 0  Down, Depressed, Hopeless 0 0 0 0 0  PHQ - 2 Score 0 0 0 0 0     Fall Risk: Fall Risk  03/13/2016 02/02/2016 11/01/2015 09/10/2015 06/28/2015  Falls in the past year? No No No No No    Functional Status Survey: Is the patient deaf or have difficulty hearing?: No Does the patient have difficulty seeing, even when wearing glasses/contacts?: No Does the patient have difficulty concentrating, remembering, or making decisions?: No Does the patient have difficulty walking or climbing stairs?: No Does the patient have difficulty dressing or bathing?: No Does the patient have difficulty doing errands alone such as visiting a doctor's office or shopping?: No    Assessment & Plan  1. Well controlled type 2 diabetes mellitus with gastroparesis ([place])  - POCT HgB A1C - POCT UA - Microalbumin - sitaGLIPtin-metformin (JANUMET) 50-1000 MG tablet; Take 1 tablet by mouth 2 (two) times daily.  Dispense: 120 tablet; Refill: 1  2. Iron deficiency  anemia  Continue follow up with Dr. Mike Gip  3. Angina pectoris ([place])  Doing well, no pain   4. Morbid obesity due to excess calories [facility])  Discussed with the patient the risk posed by an increased BMI. Discussed importance of portion control, calorie counting and at least 150 minutes of physical activity weekly. Avoid sweet beverages and drink more water. Eat at least 6 servings of fruit and vegetables daily   5. HLD (hyperlipidemia)  Check labs yearly, continue medication  6. Depression, major, recurrent, mild ([facility])  Stable on medication  7. Controlled type 2 diabetes mellitus with stage 3 chronic kidney disease, without long-term current use of insulin ([facility])  - sitaGLIPtin-metformin (JANUMET) 50-1000 MG tablet; Take 1 tablet by mouth 2 (two) times daily.  Dispense: 120 tablet; Refill: 1 Continue lisinopril  8. Menopause  - conjugated estrogens (PREMARIN) vaginal cream; Place 1 Applicatorful vaginally daily. First two weeks every night and after that twice weekly  Dispense: 90 g; Refill: 1  9. Left maxillary sinusitis  - fluticasone (FLONASE) 50 MCG/ACT nasal spray; Place 2 sprays into both nostrils daily.  Dispense: 48 g; Refill: 1  10. Foot lesion  - Ambulatory referral to Dermatology

## 2016-03-20 ENCOUNTER — Inpatient Hospital Stay: Payer: Commercial Managed Care - HMO

## 2016-03-20 ENCOUNTER — Other Ambulatory Visit: Payer: Self-pay

## 2016-03-20 ENCOUNTER — Telehealth: Payer: Self-pay | Admitting: Family Medicine

## 2016-03-20 DIAGNOSIS — I2510 Atherosclerotic heart disease of native coronary artery without angina pectoris: Secondary | ICD-10-CM | POA: Diagnosis not present

## 2016-03-20 DIAGNOSIS — Z809 Family history of malignant neoplasm, unspecified: Secondary | ICD-10-CM | POA: Insufficient documentation

## 2016-03-20 DIAGNOSIS — Z7982 Long term (current) use of aspirin: Secondary | ICD-10-CM | POA: Insufficient documentation

## 2016-03-20 DIAGNOSIS — D509 Iron deficiency anemia, unspecified: Secondary | ICD-10-CM | POA: Insufficient documentation

## 2016-03-20 DIAGNOSIS — Z79899 Other long term (current) drug therapy: Secondary | ICD-10-CM | POA: Insufficient documentation

## 2016-03-20 DIAGNOSIS — Z86010 Personal history of colonic polyps: Secondary | ICD-10-CM | POA: Insufficient documentation

## 2016-03-20 DIAGNOSIS — N2889 Other specified disorders of kidney and ureter: Secondary | ICD-10-CM | POA: Insufficient documentation

## 2016-03-20 DIAGNOSIS — E119 Type 2 diabetes mellitus without complications: Secondary | ICD-10-CM | POA: Insufficient documentation

## 2016-03-20 DIAGNOSIS — F5089 Other specified eating disorder: Secondary | ICD-10-CM | POA: Diagnosis not present

## 2016-03-20 DIAGNOSIS — Z87891 Personal history of nicotine dependence: Secondary | ICD-10-CM | POA: Insufficient documentation

## 2016-03-20 DIAGNOSIS — K5790 Diverticulosis of intestine, part unspecified, without perforation or abscess without bleeding: Secondary | ICD-10-CM | POA: Diagnosis not present

## 2016-03-20 DIAGNOSIS — G4730 Sleep apnea, unspecified: Secondary | ICD-10-CM | POA: Insufficient documentation

## 2016-03-20 DIAGNOSIS — D7589 Other specified diseases of blood and blood-forming organs: Secondary | ICD-10-CM | POA: Insufficient documentation

## 2016-03-20 DIAGNOSIS — E785 Hyperlipidemia, unspecified: Secondary | ICD-10-CM | POA: Diagnosis not present

## 2016-03-20 DIAGNOSIS — F419 Anxiety disorder, unspecified: Secondary | ICD-10-CM | POA: Diagnosis not present

## 2016-03-20 DIAGNOSIS — M109 Gout, unspecified: Secondary | ICD-10-CM | POA: Diagnosis not present

## 2016-03-20 DIAGNOSIS — Z87898 Personal history of other specified conditions: Secondary | ICD-10-CM | POA: Insufficient documentation

## 2016-03-20 DIAGNOSIS — J449 Chronic obstructive pulmonary disease, unspecified: Secondary | ICD-10-CM | POA: Diagnosis not present

## 2016-03-20 DIAGNOSIS — M129 Arthropathy, unspecified: Secondary | ICD-10-CM | POA: Diagnosis not present

## 2016-03-20 DIAGNOSIS — Z7984 Long term (current) use of oral hypoglycemic drugs: Secondary | ICD-10-CM | POA: Diagnosis not present

## 2016-03-20 DIAGNOSIS — F329 Major depressive disorder, single episode, unspecified: Secondary | ICD-10-CM | POA: Diagnosis not present

## 2016-03-20 LAB — CBC WITH DIFFERENTIAL/PLATELET
Basophils Absolute: 0.1 10*3/uL (ref 0–0.1)
Basophils Relative: 1 %
Eosinophils Absolute: 0.2 10*3/uL (ref 0–0.7)
Eosinophils Relative: 3 %
HCT: 37 % (ref 35.0–47.0)
Hemoglobin: 12.1 g/dL (ref 12.0–16.0)
Lymphocytes Relative: 41 %
Lymphs Abs: 3.2 10*3/uL (ref 1.0–3.6)
MCH: 28.6 pg (ref 26.0–34.0)
MCHC: 32.7 g/dL (ref 32.0–36.0)
MCV: 87.5 fL (ref 80.0–100.0)
Monocytes Absolute: 0.6 10*3/uL (ref 0.2–0.9)
Monocytes Relative: 7 %
Neutro Abs: 3.7 10*3/uL (ref 1.4–6.5)
Neutrophils Relative %: 48 %
Platelets: 327 10*3/uL (ref 150–440)
RBC: 4.22 MIL/uL (ref 3.80–5.20)
RDW: 15.1 % — ABNORMAL HIGH (ref 11.5–14.5)
WBC: 7.8 10*3/uL (ref 3.6–11.0)

## 2016-03-20 LAB — FERRITIN: Ferritin: 13 ng/mL (ref 11–307)

## 2016-03-20 NOTE — Telephone Encounter (Signed)
Pt states she needs a referral to [facility]. Pt is scheduled to go on 05/03/2016 to be seen Dr Cephus Shelling for a fu on her diabetes check up. Pt wants to know if we can get her in earlier? Pt is over due for her visit.

## 2016-03-20 NOTE — Telephone Encounter (Signed)
[employer] Approval [id number] from 03/20/16 through 09/17/2015 with 6 visits, [facility] and patient has been notitfied.

## 2016-03-21 ENCOUNTER — Other Ambulatory Visit: Payer: Self-pay | Admitting: Hematology and Oncology

## 2016-03-21 ENCOUNTER — Inpatient Hospital Stay: Payer: Commercial Managed Care - HMO | Attending: Hematology and Oncology | Admitting: Hematology and Oncology

## 2016-03-21 ENCOUNTER — Ambulatory Visit: Payer: Commercial Managed Care - HMO | Admitting: Hematology and Oncology

## 2016-03-21 ENCOUNTER — Encounter: Payer: Self-pay | Admitting: Hematology and Oncology

## 2016-03-21 ENCOUNTER — Inpatient Hospital Stay: Payer: Commercial Managed Care - HMO

## 2016-03-21 VITALS — BP 162/90 | HR 78 | Temp 97.6°F | Resp 16 | Wt 223.3 lb

## 2016-03-21 DIAGNOSIS — J449 Chronic obstructive pulmonary disease, unspecified: Secondary | ICD-10-CM

## 2016-03-21 DIAGNOSIS — D7589 Other specified diseases of blood and blood-forming organs: Secondary | ICD-10-CM

## 2016-03-21 DIAGNOSIS — N2889 Other specified disorders of kidney and ureter: Secondary | ICD-10-CM

## 2016-03-21 DIAGNOSIS — M109 Gout, unspecified: Secondary | ICD-10-CM | POA: Diagnosis not present

## 2016-03-21 DIAGNOSIS — E119 Type 2 diabetes mellitus without complications: Secondary | ICD-10-CM

## 2016-03-21 DIAGNOSIS — I2510 Atherosclerotic heart disease of native coronary artery without angina pectoris: Secondary | ICD-10-CM

## 2016-03-21 DIAGNOSIS — Z809 Family history of malignant neoplasm, unspecified: Secondary | ICD-10-CM

## 2016-03-21 DIAGNOSIS — F419 Anxiety disorder, unspecified: Secondary | ICD-10-CM | POA: Diagnosis not present

## 2016-03-21 DIAGNOSIS — F5089 Other specified eating disorder: Secondary | ICD-10-CM | POA: Diagnosis not present

## 2016-03-21 DIAGNOSIS — M129 Arthropathy, unspecified: Secondary | ICD-10-CM

## 2016-03-21 DIAGNOSIS — K5790 Diverticulosis of intestine, part unspecified, without perforation or abscess without bleeding: Secondary | ICD-10-CM | POA: Diagnosis not present

## 2016-03-21 DIAGNOSIS — E785 Hyperlipidemia, unspecified: Secondary | ICD-10-CM

## 2016-03-21 DIAGNOSIS — Z86010 Personal history of colonic polyps: Secondary | ICD-10-CM

## 2016-03-21 DIAGNOSIS — G4730 Sleep apnea, unspecified: Secondary | ICD-10-CM

## 2016-03-21 DIAGNOSIS — Z87898 Personal history of other specified conditions: Secondary | ICD-10-CM

## 2016-03-21 DIAGNOSIS — F329 Major depressive disorder, single episode, unspecified: Secondary | ICD-10-CM

## 2016-03-21 DIAGNOSIS — D509 Iron deficiency anemia, unspecified: Secondary | ICD-10-CM | POA: Diagnosis not present

## 2016-03-21 DIAGNOSIS — Z7984 Long term (current) use of oral hypoglycemic drugs: Secondary | ICD-10-CM

## 2016-03-21 DIAGNOSIS — Z7982 Long term (current) use of aspirin: Secondary | ICD-10-CM

## 2016-03-21 DIAGNOSIS — Z87891 Personal history of nicotine dependence: Secondary | ICD-10-CM

## 2016-03-21 DIAGNOSIS — Z79899 Other long term (current) drug therapy: Secondary | ICD-10-CM

## 2016-03-21 NOTE — Progress Notes (Signed)
[facility] day:  03/21/2016   Chief Complaint: Dana Sparks is a 65 y.o. female with iron deficiency anemia and reactive thrombocytosis who is seen for 2 month assessment.  HPI: The patient was last seen in the medical oncology clinic on 01/24/2016.  At that time, she noted ice pica.  She denied any melena or hematochezia.  Hematocrit had decreased from 38.5 to 34.5 in the prior 3 months.  Ferritin had decreased from 35 to 6.  Oral iron was restarted.  Guaiac cards were requested.  Venofer was preauthorized.  She states that her ice pica is a little better.  She is taking her iron.  Guaiac cards x 3 were negative.  She denies any melena or hematochezia.   Past Medical History  Diagnosis Date  . Diverticulitis   . Allergy   . Positive H. pylori test   . Renal insufficiency   . Gastroparesis   . Anxiety   . Sleep apnea   . COPD (chronic obstructive pulmonary disease) ([place])   . Gout   . Diabetes mellitus without complication ([place])   . Depression   . Arthritis   . Hypertension   . CAD (coronary artery disease)   . Hyperlipemia   . Thrombocytosis ([place]) 01/28/2015  . Tubular adenoma of colon 01/20/14    Past Surgical History  Procedure Laterality Date  . Abdominal hysterectomy      total  . Total knee arthroplasty Right   . Cataract extraction    . Coronary angioplasty with stent placement    . Esophagogastroduodenoscopy N/A 02/16/2015    negative h pylori, focal gastic intestinal metaplasia  . Colonoscopy  01/2014    sigmoid diverticulosis. desc colon TA, hyperplastic polyp    Family History  Problem Relation Age of Onset  . Cancer Sister     unsure  . Alcohol abuse Brother   . Colon cancer Neg Hx   . Liver disease Neg Hx   . Breast cancer Neg Hx     Social History:  reports that she quit smoking about 3 years ago. Her smoking use included Cigarettes. She has a 40 pack-year smoking history. She has never used smokeless  tobacco. She reports that she does not drink alcohol or use illicit drugs.  The patient is alone today.  Allergies:  Allergies  Allergen Reactions  . Codeine   . Contrast Media [Iodinated Diagnostic Agents] Itching    Current Medications: Current Outpatient Prescriptions  Medication Sig Dispense Refill  . allopurinol (ZYLOPRIM) 100 MG tablet Take 1 tablet (100 mg total) by mouth daily. 90 tablet 3  . amLODipine (NORVASC) 5 MG tablet TAKE 1 TABLET EVERY DAY 90 tablet 1  . aspirin EC 81 MG tablet Take 81 mg by mouth daily.    Marland Kitchen atorvastatin (LIPITOR) 40 MG tablet Take 1 tablet (40 mg total) by mouth daily. 90 tablet 3  . citalopram (CELEXA) 20 MG tablet Take 1 tablet (20 mg total) by mouth daily. 90 tablet 3  . conjugated estrogens (PREMARIN) vaginal cream Place 1 Applicatorful vaginally daily. First two weeks every night and after that twice weekly 90 g 1  . estradiol (ESTRACE) 0.5 MG tablet Take 1 tablet (0.5 mg total) by mouth daily. 90 tablet 3  . ferrous sulfate 325 (65 FE) MG tablet Take 1 tablet (325 mg total) by mouth daily with breakfast. (Patient taking differently: Take 325 mg by mouth 2 (two) times daily with a meal. )  90 tablet 3  . fexofenadine (ALLEGRA) 180 MG tablet Take 180 mg by mouth daily.    . fluticasone (FLONASE) 50 MCG/ACT nasal spray Place 2 sprays into both nostrils daily. 48 g 1  . glipiZIDE (GLUCOTROL) 5 MG tablet TAKE 2 TABLETS EVERY MORNING  AND TAKE 1 TABLET EVERY EVENING 270 tablet 0  . isosorbide mononitrate (IMDUR) 60 MG 24 hr tablet TAKE 1 TABLET EVERY DAY 90 tablet 1  . lisinopril (PRINIVIL,ZESTRIL) 40 MG tablet TAKE 1 TABLET TWICE DAILY 180 tablet 4  . loratadine (CLARITIN) 10 MG tablet Take 1 tablet by mouth daily.    . metoprolol succinate (TOPROL-XL) 50 MG 24 hr tablet Take 1 tablet (50 mg total) by mouth daily. 90 tablet 3  . pantoprazole (PROTONIX) 40 MG tablet Take 1 tablet (40 mg total) by mouth every morning. 90 tablet 3  . potassium chloride  (K-DUR,KLOR-CON) 10 MEQ tablet Take 1 tablet (10 mEq total) by mouth daily as needed. 90 tablet 3  . sitaGLIPtin-metformin (JANUMET) 50-1000 MG tablet Take 1 tablet by mouth 2 (two) times daily. 120 tablet 1  . torsemide (DEMADEX) 20 MG tablet Take 1 tablet (20 mg total) by mouth daily. (Patient taking differently: Take 20 mg by mouth as needed. ) 90 tablet 3  . valACYclovir (VALTREX) 500 MG tablet Take 1 tablet (500 mg total) by mouth 3 (three) times daily. 21 tablet 0   No current facility-administered medications for this visit.    Review of Systems:  GENERAL:  Feels "ok".  No fevers or sweats.  Weight up 3 pounds. PERFORMANCE STATUS (ECOG):  1 HEENT:  No visual changes, runny nose, sore throat, mouth sores or tenderness. Lungs: No shortness of breath or cough.  No hemoptysis. Cardiac:  No chest pain, palpitations, orthopnea, or PND. GI:  Ice pica, a little better.  No nausea, vomiting, diarrhea, constipation, melena or hematochezia. GU:  No urgency, frequency, dysuria, or hematuria. Musculoskeletal:  No back pain.  No joint pain.  No muscle tenderness. Extremities:  No pain or swelling. Skin:  No rashes or skin changes. Neuro:  No headache, numbness or weakness, balance or coordination issues. Endocrine:  Diabetes.  No thyroid issues, hot flashes or night sweats. Psych:  No mood changes, depression or anxiety. Pain:  No focal pain. Review of systems:  All other systems reviewed and found to be negative.  Physical Exam: Blood pressure 162/90, pulse 78, temperature 97.6 F (36.4 C), temperature source Tympanic, resp. rate 16, weight 223 lb 5.2 oz (101.3 kg). GENERAL:  Well developed, well nourished, sitting comfortably in the exam room in no acute distress. MENTAL STATUS:  Alert and oriented to person, place and time. HEAD:  Long curly black hair with graying.  Normocephalic, atraumatic, face symmetric, no Cushingoid features. EYES:  Glasses.  Brown eyes.  Pupils equal round and  reactive to light and accomodation.  No conjunctivitis or scleral icterus. ENT:  Oropharynx clear without lesion.  Tongue normal. Mucous membranes moist.  RESPIRATORY:  Clear to auscultation without rales, wheezes or rhonchi. CARDIOVASCULAR:  Regular rate and rhythm without murmur, rub or gallop. ABDOMEN:  Soft, non-tender, with active bowel sounds, and no appreciable hepatosplenomegaly.  No masses. SKIN:  No rashes, ulcers or lesions. EXTREMITIES: No edema, no skin discoloration or tenderness.  No palpable cords. LYMPH NODES: No palpable cervical, supraclavicular, axillary or inguinal adenopathy  NEUROLOGICAL: Unremarkable. PSYCH:  Appropriate.   Appointment on 03/20/2016  Component Date Value Ref Range Status  . WBC 03/20/2016  7.8  3.6 - 11.0 K/uL Final  . RBC 03/20/2016 4.22  3.80 - 5.20 MIL/uL Final  . Hemoglobin 03/20/2016 12.1  12.0 - 16.0 g/dL Final  . HCT 03/20/2016 37.0  35.0 - 47.0 % Final  . MCV 03/20/2016 87.5  80.0 - 100.0 fL Final  . MCH 03/20/2016 28.6  26.0 - 34.0 pg Final  . MCHC 03/20/2016 32.7  32.0 - 36.0 g/dL Final  . RDW 03/20/2016 15.1* 11.5 - 14.5 % Final  . Platelets 03/20/2016 327  150 - 440 K/uL Final  . Neutrophils Relative % 03/20/2016 48   Final  . Neutro Abs 03/20/2016 3.7  1.4 - 6.5 K/uL Final  . Lymphocytes Relative 03/20/2016 41   Final  . Lymphs Abs 03/20/2016 3.2  1.0 - 3.6 K/uL Final  . Monocytes Relative 03/20/2016 7   Final  . Monocytes Absolute 03/20/2016 0.6  0.2 - 0.9 K/uL Final  . Eosinophils Relative 03/20/2016 3   Final  . Eosinophils Absolute 03/20/2016 0.2  0 - 0.7 K/uL Final  . Basophils Relative 03/20/2016 1   Final  . Basophils Absolute 03/20/2016 0.1  0 - 0.1 K/uL Final  . Ferritin 03/20/2016 13  11 - 307 ng/mL Final    Assessment:  Dana Sparks is a 65 y.o. female with iron deficiency anemia and reactive thrombocytosis.  She had positive H pylori serologies and breath test.  She completed an antibiotics course.   Her diet is good.   She denies any GI bleeding (melena or hematochezia), vaginal or GU bleeding.   Colonoscopy on 01/20/2014 and revealed a 6 mm polyp in the descending colon, a 5 mm polyp in the sigmoid colon, and diverticulosis.  Pathology was benign.  EGD on 02/16/2015 was normal.  Pathology revealed no active inflammation, dysplasia or malignancy.  Work-up on 12/22/2014 confirmed iron deficiency.  Labs included a hematocrit 29.5, hemoglobin 8.8, MCV 74, platelets 428,000 and white count 6800 with an ANC of 3332. Ferritin was 4 (low) with a TIBC of 518 (elevated). Iron saturation was 3%. Folate was 23.3. Guaiac cards were negative.   Diet is good.  She stopped taking oral iron for 1 month.  Hematocrit dropped from 38.5 to 34.5.  Ferritin dropped from 35 to 6.  She has been back on oral iron x 2 months.  Guaiac cards x 3 were negative (02/12/2016 -02/14/2016).  Symptomatically, she denies any complaint.  She has less ice pica.  She denies any melena or hematochezia.  Exam in unremarkable.  Hematocrit has improved from 34.5 to 37.0 in the past 2 months.  Ferritin has increased from 6 to 13.  1.  Labs today:  CBC witth diff, ferritin. 2.  Continue oral iron BID with OJ or vitamin C. Ferritin goal 100. 3.  Discuss Venofer if hematocrit drops despite oral iron. 4.  Consider referral back to GI if hematocrit drops again. 5.  RTC in 3 months for MD assessment and labs (CBC with diff, ferritin).   Lequita Asal, MD  03/21/2016, 11:29 AM

## 2016-03-21 NOTE — Progress Notes (Signed)
Patient is here for a follow up, no complaints today. Had a hard time getting bp but with recheck it was  147/90 with pulse 77

## 2016-03-28 DIAGNOSIS — B351 Tinea unguium: Secondary | ICD-10-CM | POA: Diagnosis not present

## 2016-03-28 DIAGNOSIS — E1142 Type 2 diabetes mellitus with diabetic polyneuropathy: Secondary | ICD-10-CM | POA: Diagnosis not present

## 2016-03-28 DIAGNOSIS — L851 Acquired keratosis [keratoderma] palmaris et plantaris: Secondary | ICD-10-CM | POA: Diagnosis not present

## 2016-03-28 DIAGNOSIS — L819 Disorder of pigmentation, unspecified: Secondary | ICD-10-CM | POA: Diagnosis not present

## 2016-03-30 DIAGNOSIS — D2271 Melanocytic nevi of right lower limb, including hip: Secondary | ICD-10-CM | POA: Diagnosis not present

## 2016-03-30 DIAGNOSIS — L818 Other specified disorders of pigmentation: Secondary | ICD-10-CM | POA: Diagnosis not present

## 2016-03-30 DIAGNOSIS — L819 Disorder of pigmentation, unspecified: Secondary | ICD-10-CM | POA: Diagnosis not present

## 2016-04-04 ENCOUNTER — Other Ambulatory Visit: Payer: Self-pay | Admitting: Family Medicine

## 2016-04-04 NOTE — Telephone Encounter (Signed)
Patient requesting refill. 

## 2016-04-24 ENCOUNTER — Other Ambulatory Visit: Payer: Self-pay | Admitting: Hematology and Oncology

## 2016-04-25 ENCOUNTER — Other Ambulatory Visit: Payer: Self-pay | Admitting: Family Medicine

## 2016-04-25 NOTE — Telephone Encounter (Signed)
Patient requesting refill of Protonix be sent to [facility].

## 2016-04-26 ENCOUNTER — Ambulatory Visit (INDEPENDENT_AMBULATORY_CARE_PROVIDER_SITE_OTHER): Payer: Commercial Managed Care - HMO | Admitting: Family Medicine

## 2016-04-26 ENCOUNTER — Encounter: Payer: Self-pay | Admitting: Family Medicine

## 2016-04-26 ENCOUNTER — Other Ambulatory Visit: Payer: Self-pay | Admitting: Emergency Medicine

## 2016-04-26 VITALS — BP 124/84 | HR 66 | Temp 97.7°F | Resp 18 | Ht 66.0 in | Wt 220.1 lb

## 2016-04-26 DIAGNOSIS — N183 Chronic kidney disease, stage 3 unspecified: Secondary | ICD-10-CM

## 2016-04-26 DIAGNOSIS — M791 Myalgia, unspecified site: Secondary | ICD-10-CM

## 2016-04-26 DIAGNOSIS — E1122 Type 2 diabetes mellitus with diabetic chronic kidney disease: Secondary | ICD-10-CM

## 2016-04-26 DIAGNOSIS — R682 Dry mouth, unspecified: Secondary | ICD-10-CM | POA: Diagnosis not present

## 2016-04-26 MED ORDER — COQ10 100 MG PO CAPS: 1.0000 | ORAL_CAPSULE | Freq: Every day | ORAL | 1 refills | Status: DC

## 2016-04-26 NOTE — Progress Notes (Signed)
Name: Dana Sparks   MRN: [id number]    DOB: 06/09/1951   Date:04/26/2016       Progress Note  Subjective  Chief Complaint  Chief Complaint  Patient presents with  . Dehydration    dry mouth  . Muscle Pain    HPI  Myalgia: she has been sore all over her body. Pain when pressure is applied to her body. She denies increase in fatigue, denies mental fogginess or headaches. She has been on statin therapy for many years, she has been on Crestor for almost one year. She states generalized body aches started about 2 weeks ago. No tick bites, no change in physical activity.   Dry mouth: she has noticed severe dry mouth over the past two days, she has mild decrease in appetite, no joint aches, no fever, or chills. She also states glucose this am was high. She denies urinary frequency   Patient Active Problem List   Diagnosis Date Noted  . Angina pectoris ([place]) 11/01/2015  . Well controlled type 2 diabetes mellitus with gastroparesis ([place]) 06/22/2015  . Low TSH level 06/22/2015  . Iron deficiency anemia 04/07/2015  . Intestinal metaplasia of gastric mucosa 03/11/2015  . CAD (coronary artery disease), native coronary artery 02/24/2015  . History of coronary artery stent placement 02/24/2015  . Chronic kidney disease, stage 3, mod decreased GFR 02/24/2015  . Menopause 02/24/2015  . History of Helicobacter pylori infection 02/24/2015  . Mild mitral insufficiency 02/24/2015  . Mild tricuspid insufficiency 02/24/2015  . 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 ([place]) 02/24/2015  . Controlled gout 02/24/2015  . Depression, major, recurrent, mild ([place]) 02/24/2015  . Morbid obesity due to excess calories ([place]) 02/24/2015  . Mild pulmonary hypertension ([place]) 02/24/2015  . Gastroesophageal reflux disease without esophagitis 02/24/2015  . Perennial allergic rhinitis 02/24/2015  . HLD (hyperlipidemia) 02/20/2015  . Hypertension, benign 02/20/2015  . Apnea, sleep 02/20/2015   . Controlled diabetes mellitus with stage 3 chronic kidney disease, without long-term current use of insulin ([place]) 02/20/2015  . Thrombocytosis ([place]) 01/28/2015    Past Surgical History:  Procedure Laterality Date  . ABDOMINAL HYSTERECTOMY     total  . CATARACT EXTRACTION    . COLONOSCOPY  01/2014   sigmoid diverticulosis. desc colon TA, hyperplastic polyp  . CORONARY ANGIOPLASTY WITH STENT PLACEMENT    . ESOPHAGOGASTRODUODENOSCOPY N/A 02/16/2015   negative h pylori, focal gastic intestinal metaplasia  . TOTAL KNEE ARTHROPLASTY Right     Family History  Problem Relation Age of Onset  . Cancer Sister     unsure  . Alcohol abuse Brother   . Colon cancer Neg Hx   . Liver disease Neg Hx   . Breast cancer Neg Hx     Social History   Social History  . Marital status: Married    Spouse name: N/A  . Number of children: N/A  . Years of education: N/A   Occupational History  . Not on file.   Social History Main Topics  . Smoking status: Former Smoker    Packs/day: 1.00    Years: 40.00    Types: Cigarettes    Quit date: 12/30/2012  . Smokeless tobacco: Never Used  . Alcohol use No  . Drug use: No  . Sexual activity: Not Currently   Other Topics Concern  . Not on file   Social History Narrative  . No narrative on file     Current Outpatient Prescriptions:  .  allopurinol (  ZYLOPRIM) 100 MG tablet, Take 1 tablet (100 mg total) by mouth daily., Disp: 90 tablet, Rfl: 3 .  amLODipine (NORVASC) 5 MG tablet, TAKE 1 TABLET EVERY DAY, Disp: 90 tablet, Rfl: 1 .  aspirin EC 81 MG tablet, Take 81 mg by mouth daily., Disp: , Rfl:  .  citalopram (CELEXA) 20 MG tablet, Take 1 tablet (20 mg total) by mouth daily., Disp: 90 tablet, Rfl: 3 .  conjugated estrogens (PREMARIN) vaginal cream, Place 1 Applicatorful vaginally daily. First two weeks every night and after that twice weekly, Disp: 90 g, Rfl: 1 .  estradiol (ESTRACE) 0.5 MG tablet, Take 1 tablet (0.5 mg total) by mouth daily.,  Disp: 90 tablet, Rfl: 3 .  ferrous sulfate 325 (65 FE) MG tablet, Take 1 tablet (325 mg total) by mouth daily with breakfast. (Patient taking differently: Take 325 mg by mouth 2 (two) times daily with a meal. ), Disp: 90 tablet, Rfl: 3 .  fexofenadine (ALLEGRA) 180 MG tablet, Take 180 mg by mouth daily., Disp: , Rfl:  .  fluticasone (FLONASE) 50 MCG/ACT nasal spray, Place 2 sprays into both nostrils daily., Disp: 48 g, Rfl: 1 .  glipiZIDE (GLUCOTROL) 5 MG tablet, TAKE 2 TABLETS EVERY MORNING  AND TAKE 1 TABLET EVERY EVENING, Disp: 270 tablet, Rfl: 0 .  isosorbide mononitrate (IMDUR) 60 MG 24 hr tablet, TAKE 1 TABLET EVERY DAY, Disp: 90 tablet, Rfl: 1 .  lisinopril (PRINIVIL,ZESTRIL) 40 MG tablet, TAKE 1 TABLET TWICE DAILY, Disp: 180 tablet, Rfl: 4 .  loratadine (CLARITIN) 10 MG tablet, Take 1 tablet by mouth daily., Disp: , Rfl:  .  metoprolol succinate (TOPROL-XL) 50 MG 24 hr tablet, Take 1 tablet (50 mg total) by mouth daily., Disp: 90 tablet, Rfl: 3 .  pantoprazole (PROTONIX) 40 MG tablet, TAKE 1 TABLET EVERY MORNING, Disp: 90 tablet, Rfl: 1 .  potassium chloride (K-DUR,KLOR-CON) 10 MEQ tablet, Take 1 tablet (10 mEq total) by mouth daily as needed., Disp: 90 tablet, Rfl: 3 .  rosuvastatin (CRESTOR) 20 MG tablet, TAKE 1 TABLET EVERY DAY, Disp: 90 tablet, Rfl: 3 .  sitaGLIPtin-metformin (JANUMET) 50-1000 MG tablet, Take 1 tablet by mouth 2 (two) times daily., Disp: 120 tablet, Rfl: 1 .  torsemide (DEMADEX) 20 MG tablet, Take 1 tablet (20 mg total) by mouth daily. (Patient taking differently: Take 20 mg by mouth as needed. ), Disp: 90 tablet, Rfl: 3 .  valACYclovir (VALTREX) 500 MG tablet, Take 1 tablet (500 mg total) by mouth 3 (three) times daily., Disp: 21 tablet, Rfl: 0  Allergies  Allergen Reactions  . Codeine   . Contrast Media [Iodinated Diagnostic Agents] Itching  . Sulfa Antibiotics Itching     ROS  Ten systems reviewed and is negative except as mentioned in HPI    Objective  Vitals:   04/26/16 1535  BP: 124/84  Pulse: 66  Resp: 18  Temp: 97.7 F (36.5 C)  SpO2: 97%  Weight: 220 lb 1 oz (99.8 kg)  Height: 5\' 6"  (1.676 m)    Body mass index is 35.52 kg/m.  Physical Exam  Constitutional: Patient appears well-developed and well-nourished. Obese  No distress.  HEENT: head atraumatic, normocephalic, pupils equal and reactive to light,neck supple, throat within normal limits Cardiovascular: Normal rate, regular rhythm and normal heart sounds.  No murmur heard. No BLE edema. Pulmonary/Chest: Effort normal and breath sounds normal. No respiratory distress. Abdominal: Soft.  There is no tenderness. Psychiatric: Patient has a normal mood and affect. behavior is  normal. Judgment and thought content normal.  Muscular Skeletal: she is aching all over, no synovitis or decrease in rom of any joints.    Recent Results (from the past 2160 hour(s))  Urine culture     Status: None   Collection Time: 02/02/16 12:00 AM  Result Value Ref Range   Urine Culture, Routine Final report    Urine Culture result 1 Comment     Comment: Mixed urogenital flora Less than 10,000 colonies/mL   Herpes simplex virus culture     Status: None   Collection Time: 02/02/16 12:00 AM  Result Value Ref Range   HSV Culture/Type Comment     Comment: Negative No Herpes simplex virus isolated.   Please Note     Status: None   Collection Time: 02/02/16 12:00 AM  Result Value Ref Range   Please note Comment     Comment: The date and/or time of collection was not indicated on the requisition as required by state and federal law.  The date of receipt of the specimen was used as the collection date if not supplied.   Please Note     Status: None   Collection Time: 02/02/16 12:00 AM  Result Value Ref Range   Please note Comment     Comment: The date and/or time of collection was not indicated on the requisition as required by state and federal law.  The date of receipt  of the specimen was used as the collection date if not supplied.   POCT Urinalysis Dipstick     Status: Normal   Collection Time: 02/02/16  9:37 AM  Result Value Ref Range   Color, UA dark yellow    Clarity, UA clear    Glucose, UA neg    Bilirubin, UA neg    Ketones, UA neg    Spec Grav, UA 1.020    Blood, UA neg    pH, UA 6.0    Protein, UA negative    Urobilinogen, UA negative    Nitrite, UA neg    Leukocytes, UA Negative Negative  POCT Wet Prep [place])     Status: Abnormal   Collection Time: 02/02/16 10:11 AM  Result Value Ref Range   Source Wet Prep POC vaginal    WBC, Wet Prep HPF POC none    Bacteria Wet Prep HPF POC None None, Few, Too numerous to count   BACTERIA WET PREP MORPHOLOGY POC     Clue Cells Wet Prep HPF POC Few (A) None, Too numerous to count   Clue Cells Wet Prep [place] POC     Yeast Wet Prep HPF POC Few    KOH Wet Prep POC     Trichomonas Wet Prep HPF POC none   Occult blood card to lab, stool     Status: None   Collection Time: 02/12/16  9:37 AM  Result Value Ref Range   Fecal Occult Bld NEGATIVE NEGATIVE  Occult blood card to lab, stool     Status: None   Collection Time: 02/13/16  8:58 AM  Result Value Ref Range   Fecal Occult Bld NEGATIVE NEGATIVE  Occult blood card to lab, stool     Status: None   Collection Time: 02/14/16  9:29 AM  Result Value Ref Range   Fecal Occult Bld NEGATIVE NEGATIVE  POCT UA - Microalbumin     Status: Abnormal   Collection Time: 03/13/16  9:38 AM  Result Value Ref Range   Microalbumin Ur, POC 50  mg/L   Creatinine, POC  mg/dL   Albumin/Creatinine Ratio, Urine, POC    POCT HgB A1C     Status: Abnormal   Collection Time: 03/13/16  9:42 AM  Result Value Ref Range   Hemoglobin A1C 6.7   CBC with Differential     Status: Abnormal   Collection Time: 03/20/16 11:30 AM  Result Value Ref Range   WBC 7.8 3.6 - 11.0 K/uL   RBC 4.22 3.80 - 5.20 MIL/uL   Hemoglobin 12.1 12.0 - 16.0 g/dL   HCT 37.0 35.0 - 47.0 %   MCV  87.5 80.0 - 100.0 fL   MCH 28.6 26.0 - 34.0 pg   MCHC 32.7 32.0 - 36.0 g/dL   RDW 15.1 (H) 11.5 - 14.5 %   Platelets 327 150 - 440 K/uL   Neutrophils Relative % 48 %   Neutro Abs 3.7 1.4 - 6.5 K/uL   Lymphocytes Relative 41 %   Lymphs Abs 3.2 1.0 - 3.6 K/uL   Monocytes Relative 7 %   Monocytes Absolute 0.6 0.2 - 0.9 K/uL   Eosinophils Relative 3 %   Eosinophils Absolute 0.2 0 - 0.7 K/uL   Basophils Relative 1 %   Basophils Absolute 0.1 0 - 0.1 K/uL  Ferritin     Status: None   Collection Time: 03/20/16 11:30 AM  Result Value Ref Range   Ferritin 13 11 - 307 ng/mL      PHQ2/9: Depression screen [facility] 2/9 04/26/2016 03/13/2016 02/02/2016 11/01/2015 09/10/2015  Decreased Interest 0 0 0 0 0  Down, Depressed, Hopeless 0 0 0 0 0  PHQ - 2 Score 0 0 0 0 0    Fall Risk: Fall Risk  04/26/2016 03/13/2016 02/02/2016 11/01/2015 09/10/2015  Falls in the past year? No No No No No     Functional Status Survey: Is the patient deaf or have difficulty hearing?: No Does the patient have difficulty seeing, even when wearing glasses/contacts?: No Does the patient have difficulty concentrating, remembering, or making decisions?: No Does the patient have difficulty walking or climbing stairs?: No Does the patient have difficulty dressing or bathing?: No Does the patient have difficulty doing errands alone such as visiting a doctor's office or shopping?: No    Assessment & Plan  1. Myalgia  Generalized mus cle pain, over the past two weeks, not precede by an infection , change in medication or change in physical activity level, explained that it may be secondary to statin therapy, however she is afraid of stopping it, advised to take half crestor daily and add Co-Q10 and monitor for symptoms - Coenzyme Q10 (COQ10) 100 MG CAPS; Take 1 capsule by mouth daily.  Dispense: 90 each; Refill: 1 - COMPLETE METABOLIC PANEL WITH GFR - CK total and CKMB (cardiac)not at [facility] - C-reactive protein - Sedimentation  rate  2. Dry mouth  - ANA,IFA Sjogrens' Pnl rflx Tit/Patn - COMPLETE METABOLIC PANEL WITH GFR - CK total and CKMB (cardiac)not at [facility] - C-reactive protein - Sedimentation rate   3. Controlled type 2 diabetes mellitus with stage 3 chronic kidney disease, without long-term current use of insulin (HCC)  - COMPLETE METABOLIC PANEL WITH GFR

## 2016-04-26 NOTE — Addendum Note (Signed)
Addended by: Dorella Laster, Ulla Potash on: 04/26/2016 04:52 PM   Modules accepted: Orders

## 2016-04-27 ENCOUNTER — Telehealth: Payer: Self-pay

## 2016-04-27 ENCOUNTER — Other Ambulatory Visit: Payer: Self-pay | Admitting: Family Medicine

## 2016-04-27 DIAGNOSIS — R768 Other specified abnormal immunological findings in serum: Secondary | ICD-10-CM | POA: Insufficient documentation

## 2016-04-27 LAB — COMPLETE METABOLIC PANEL WITH GFR
ALT: 16 U/L (ref 6–29)
AST: 15 U/L (ref 10–35)
Albumin: 4.4 g/dL (ref 3.6–5.1)
Alkaline Phosphatase: 55 U/L (ref 33–130)
BUN: 16 mg/dL (ref 7–25)
CHLORIDE: 102 mmol/L (ref 98–110)
CO2: 20 mmol/L (ref 20–31)
Calcium: 9.5 mg/dL (ref 8.6–10.4)
Creat: 0.96 mg/dL (ref 0.50–0.99)
GFR, EST NON AFRICAN AMERICAN: 63 mL/min (ref >=60)
GFR, Est African American: 72 mL/min (ref >=60)
GLUCOSE: 422 mg/dL — AB (ref 65–99)
POTASSIUM: 5 mmol/L (ref 3.5–5.3)
Sodium: 136 mmol/L (ref 135–146)
Total Bilirubin: 0.2 mg/dL (ref 0.2–1.2)
Total Protein: 7 g/dL (ref 6.1–8.1)

## 2016-04-27 LAB — C-REACTIVE PROTEIN: CRP: <0.5 mg/dL (ref <0.60)

## 2016-04-27 LAB — CK TOTAL AND CKMB (NOT AT ARMC)
CK TOTAL: 62 U/L (ref 7–177)
CK, MB: <0.7 ng/mL (ref 0.0–5.0)

## 2016-04-27 LAB — ANTI-NUCLEAR AB-TITER (ANA TITER)

## 2016-04-27 LAB — ANA,IFA SJOGRENS' PNL RFLX TIT/PATN
ANA: POSITIVE — AB
SSA (RO) (ENA) ANTIBODY, IGG: NEGATIVE
SSB (LA) (ENA) ANTIBODY, IGG: NEGATIVE

## 2016-04-27 LAB — SEDIMENTATION RATE: SED RATE: 2 mm/h (ref 0–30)

## 2016-04-27 NOTE — Telephone Encounter (Signed)
Received a Alert from the Lab about patient Glucose level was 422.

## 2016-04-29 ENCOUNTER — Observation Stay
Admission: EM | Admit: 2016-04-29 | Discharge: 2016-05-02 | Disposition: A | Discharge status: Home / Self Care | Payer: Commercial Managed Care - HMO | Attending: Internal Medicine | Admitting: Internal Medicine

## 2016-04-29 ENCOUNTER — Encounter: Payer: Self-pay | Admitting: Emergency Medicine

## 2016-04-29 ENCOUNTER — Emergency Department: Payer: Commercial Managed Care - HMO

## 2016-04-29 DIAGNOSIS — F419 Anxiety disorder, unspecified: Secondary | ICD-10-CM | POA: Insufficient documentation

## 2016-04-29 DIAGNOSIS — R531 Weakness: Secondary | ICD-10-CM | POA: Insufficient documentation

## 2016-04-29 DIAGNOSIS — I081 Rheumatic disorders of both mitral and tricuspid valves: Secondary | ICD-10-CM | POA: Insufficient documentation

## 2016-04-29 DIAGNOSIS — Z87891 Personal history of nicotine dependence: Secondary | ICD-10-CM | POA: Insufficient documentation

## 2016-04-29 DIAGNOSIS — M940 Chondrocostal junction syndrome [Tietze]: Principal | ICD-10-CM | POA: Insufficient documentation

## 2016-04-29 DIAGNOSIS — M109 Gout, unspecified: Secondary | ICD-10-CM | POA: Diagnosis not present

## 2016-04-29 DIAGNOSIS — R748 Abnormal levels of other serum enzymes: Secondary | ICD-10-CM | POA: Diagnosis not present

## 2016-04-29 DIAGNOSIS — E785 Hyperlipidemia, unspecified: Secondary | ICD-10-CM | POA: Diagnosis not present

## 2016-04-29 DIAGNOSIS — Z86010 Personal history of colonic polyps: Secondary | ICD-10-CM | POA: Insufficient documentation

## 2016-04-29 DIAGNOSIS — G4733 Obstructive sleep apnea (adult) (pediatric): Secondary | ICD-10-CM | POA: Insufficient documentation

## 2016-04-29 DIAGNOSIS — I129 Hypertensive chronic kidney disease with stage 1 through stage 4 chronic kidney disease, or unspecified chronic kidney disease: Secondary | ICD-10-CM | POA: Insufficient documentation

## 2016-04-29 DIAGNOSIS — Z882 Allergy status to sulfonamides status: Secondary | ICD-10-CM | POA: Insufficient documentation

## 2016-04-29 DIAGNOSIS — R0602 Shortness of breath: Secondary | ICD-10-CM

## 2016-04-29 DIAGNOSIS — M1990 Unspecified osteoarthritis, unspecified site: Secondary | ICD-10-CM | POA: Diagnosis not present

## 2016-04-29 DIAGNOSIS — R739 Hyperglycemia, unspecified: Secondary | ICD-10-CM

## 2016-04-29 DIAGNOSIS — Z91041 Radiographic dye allergy status: Secondary | ICD-10-CM | POA: Insufficient documentation

## 2016-04-29 DIAGNOSIS — Z8619 Personal history of other infectious and parasitic diseases: Secondary | ICD-10-CM | POA: Insufficient documentation

## 2016-04-29 DIAGNOSIS — K219 Gastro-esophageal reflux disease without esophagitis: Secondary | ICD-10-CM | POA: Insufficient documentation

## 2016-04-29 DIAGNOSIS — Z794 Long term (current) use of insulin: Secondary | ICD-10-CM | POA: Insufficient documentation

## 2016-04-29 DIAGNOSIS — Z955 Presence of coronary angioplasty implant and graft: Secondary | ICD-10-CM | POA: Insufficient documentation

## 2016-04-29 DIAGNOSIS — Z6835 Body mass index (BMI) 35.0-35.9, adult: Secondary | ICD-10-CM | POA: Insufficient documentation

## 2016-04-29 DIAGNOSIS — Z809 Family history of malignant neoplasm, unspecified: Secondary | ICD-10-CM | POA: Insufficient documentation

## 2016-04-29 DIAGNOSIS — F330 Major depressive disorder, recurrent, mild: Secondary | ICD-10-CM | POA: Insufficient documentation

## 2016-04-29 DIAGNOSIS — K3184 Gastroparesis: Secondary | ICD-10-CM | POA: Insufficient documentation

## 2016-04-29 DIAGNOSIS — E1122 Type 2 diabetes mellitus with diabetic chronic kidney disease: Secondary | ICD-10-CM | POA: Insufficient documentation

## 2016-04-29 DIAGNOSIS — D509 Iron deficiency anemia, unspecified: Secondary | ICD-10-CM | POA: Insufficient documentation

## 2016-04-29 DIAGNOSIS — M7500 Adhesive capsulitis of unspecified shoulder: Secondary | ICD-10-CM | POA: Insufficient documentation

## 2016-04-29 DIAGNOSIS — J449 Chronic obstructive pulmonary disease, unspecified: Secondary | ICD-10-CM | POA: Insufficient documentation

## 2016-04-29 DIAGNOSIS — R079 Chest pain, unspecified: Secondary | ICD-10-CM | POA: Diagnosis not present

## 2016-04-29 DIAGNOSIS — Z90710 Acquired absence of both cervix and uterus: Secondary | ICD-10-CM | POA: Insufficient documentation

## 2016-04-29 DIAGNOSIS — E1165 Type 2 diabetes mellitus with hyperglycemia: Secondary | ICD-10-CM | POA: Diagnosis not present

## 2016-04-29 DIAGNOSIS — I2510 Atherosclerotic heart disease of native coronary artery without angina pectoris: Secondary | ICD-10-CM | POA: Diagnosis not present

## 2016-04-29 DIAGNOSIS — Z7982 Long term (current) use of aspirin: Secondary | ICD-10-CM | POA: Insufficient documentation

## 2016-04-29 DIAGNOSIS — I272 Other secondary pulmonary hypertension: Secondary | ICD-10-CM | POA: Diagnosis not present

## 2016-04-29 DIAGNOSIS — D7589 Other specified diseases of blood and blood-forming organs: Secondary | ICD-10-CM | POA: Diagnosis not present

## 2016-04-29 DIAGNOSIS — R778 Other specified abnormalities of plasma proteins: Secondary | ICD-10-CM | POA: Diagnosis not present

## 2016-04-29 DIAGNOSIS — N183 Chronic kidney disease, stage 3 unspecified: Secondary | ICD-10-CM

## 2016-04-29 DIAGNOSIS — Z885 Allergy status to narcotic agent status: Secondary | ICD-10-CM | POA: Insufficient documentation

## 2016-04-29 DIAGNOSIS — E1143 Type 2 diabetes mellitus with diabetic autonomic (poly)neuropathy: Secondary | ICD-10-CM | POA: Insufficient documentation

## 2016-04-29 DIAGNOSIS — Z96651 Presence of right artificial knee joint: Secondary | ICD-10-CM | POA: Insufficient documentation

## 2016-04-29 DIAGNOSIS — E119 Type 2 diabetes mellitus without complications: Secondary | ICD-10-CM | POA: Diagnosis not present

## 2016-04-29 DIAGNOSIS — Z811 Family history of alcohol abuse and dependence: Secondary | ICD-10-CM | POA: Insufficient documentation

## 2016-04-29 DIAGNOSIS — Z9849 Cataract extraction status, unspecified eye: Secondary | ICD-10-CM | POA: Insufficient documentation

## 2016-04-29 LAB — GLUCOSE, CAPILLARY
GLUCOSE-CAPILLARY: 400 mg/dL — AB (ref 65–99)
GLUCOSE-CAPILLARY: 371 mg/dL — AB (ref 65–99)
GLUCOSE-CAPILLARY: 242 mg/dL — AB (ref 65–99)
GLUCOSE-CAPILLARY: 252 mg/dL — AB (ref 65–99)
Glucose-Capillary: 268 mg/dL — ABNORMAL HIGH (ref 65–99)

## 2016-04-29 LAB — CBC
HEMATOCRIT: 39.2 % (ref 35.0–47.0)
HEMOGLOBIN: 12.9 g/dL (ref 12.0–16.0)
MCH: 29.9 pg (ref 26.0–34.0)
MCHC: 33 g/dL (ref 32.0–36.0)
MCV: 90.5 fL (ref 80.0–100.0)
Platelets: 270 10*3/uL (ref 150–440)
RBC: 4.33 MIL/uL (ref 3.80–5.20)
RDW: 16.5 % — ABNORMAL HIGH (ref 11.5–14.5)
WBC: 6.5 10*3/uL (ref 3.6–11.0)

## 2016-04-29 LAB — URINALYSIS COMPLETE WITH MICROSCOPIC (ARMC ONLY)
Bilirubin Urine: NEGATIVE
HGB URINE DIPSTICK: NEGATIVE
Leukocytes, UA: NEGATIVE
NITRITE: NEGATIVE
PROTEIN: NEGATIVE mg/dL
Specific Gravity, Urine: 1.017 (ref 1.005–1.030)
pH: 5 (ref 5.0–8.0)

## 2016-04-29 LAB — BASIC METABOLIC PANEL
ANION GAP: 11 (ref 5–15)
BUN: 17 mg/dL (ref 6–20)
CHLORIDE: 106 mmol/L (ref 101–111)
CO2: 20 mmol/L — ABNORMAL LOW (ref 22–32)
Calcium: 9.7 mg/dL (ref 8.9–10.3)
Creatinine, Ser: 0.96 mg/dL (ref 0.44–1.00)
GFR calc Af Amer: >60 mL/min (ref >60)
Glucose, Bld: 406 mg/dL — ABNORMAL HIGH (ref 65–99)
POTASSIUM: 4.3 mmol/L (ref 3.5–5.1)
SODIUM: 137 mmol/L (ref 135–145)

## 2016-04-29 LAB — TROPONIN I: Troponin I: 0.09 ng/mL (ref <0.03)

## 2016-04-29 MED ORDER — INSULIN ASPART 100 UNIT/ML ~~LOC~~ SOLN
10.0000 [IU] | Freq: Once | SUBCUTANEOUS | Status: AC
  Administered 2016-04-29: 10 [IU] via SUBCUTANEOUS
  Filled 2016-04-29: qty 10

## 2016-04-29 MED ORDER — ACETAMINOPHEN 325 MG PO TABS
650.0000 mg | ORAL_TABLET | ORAL | Status: DC | PRN
  Administered 2016-04-30: 650 mg via ORAL
  Filled 2016-04-29: qty 2

## 2016-04-29 MED ORDER — TORSEMIDE 20 MG PO TABS
20.0000 mg | ORAL_TABLET | Freq: Every day | ORAL | Status: DC
  Administered 2016-04-30 – 2016-05-02 (×3): 20 mg via ORAL
  Filled 2016-04-29 (×3): qty 1

## 2016-04-29 MED ORDER — ASPIRIN EC 81 MG PO TBEC
81.0000 mg | DELAYED_RELEASE_TABLET | ORAL | Status: DC
  Administered 2016-04-30 – 2016-05-02 (×3): 81 mg via ORAL
  Filled 2016-04-29 (×3): qty 1

## 2016-04-29 MED ORDER — CITALOPRAM HYDROBROMIDE 20 MG PO TABS
20.0000 mg | ORAL_TABLET | Freq: Every day | ORAL | Status: DC
  Administered 2016-04-30 – 2016-05-02 (×3): 20 mg via ORAL
  Filled 2016-04-29 (×3): qty 1

## 2016-04-29 MED ORDER — PANTOPRAZOLE SODIUM 40 MG PO TBEC
40.0000 mg | DELAYED_RELEASE_TABLET | Freq: Every morning | ORAL | Status: DC
  Administered 2016-04-30 – 2016-05-02 (×3): 40 mg via ORAL
  Filled 2016-04-29 (×3): qty 1

## 2016-04-29 MED ORDER — LORATADINE 10 MG PO TABS
10.0000 mg | ORAL_TABLET | Freq: Every day | ORAL | Status: DC
  Administered 2016-04-30 – 2016-05-02 (×3): 10 mg via ORAL
  Filled 2016-04-29 (×3): qty 1

## 2016-04-29 MED ORDER — MORPHINE SULFATE (PF) 2 MG/ML IV SOLN
2.0000 mg | INTRAVENOUS | Status: DC | PRN
  Administered 2016-04-29: 2 mg via INTRAVENOUS
  Filled 2016-04-29 (×2): qty 1

## 2016-04-29 MED ORDER — SODIUM CHLORIDE 0.9 % IV SOLN
INTRAVENOUS | Status: DC
  Administered 2016-04-29: 17:00:00 via INTRAVENOUS

## 2016-04-29 MED ORDER — ROSUVASTATIN CALCIUM 20 MG PO TABS
20.0000 mg | ORAL_TABLET | Freq: Every day | ORAL | Status: DC
  Administered 2016-04-30 – 2016-05-02 (×3): 20 mg via ORAL
  Filled 2016-04-29 (×3): qty 1

## 2016-04-29 MED ORDER — INSULIN ASPART 100 UNIT/ML ~~LOC~~ SOLN
0.0000 [IU] | Freq: Three times a day (TID) | SUBCUTANEOUS | Status: DC
  Administered 2016-04-30: 5 [IU] via SUBCUTANEOUS
  Administered 2016-04-30: 7 [IU] via SUBCUTANEOUS
  Administered 2016-04-30: 9 [IU] via SUBCUTANEOUS
  Administered 2016-05-01 (×2): 7 [IU] via SUBCUTANEOUS
  Administered 2016-05-01: 3 [IU] via SUBCUTANEOUS
  Administered 2016-05-02 (×2): 5 [IU] via SUBCUTANEOUS
  Filled 2016-04-29: qty 7
  Filled 2016-04-29 (×2): qty 5
  Filled 2016-04-29: qty 7
  Filled 2016-04-29: qty 5
  Filled 2016-04-29: qty 7
  Filled 2016-04-29: qty 3
  Filled 2016-04-29: qty 9

## 2016-04-29 MED ORDER — ASPIRIN EC 325 MG PO TBEC
325.0000 mg | DELAYED_RELEASE_TABLET | Freq: Once | ORAL | Status: AC
  Administered 2016-04-29: 325 mg via ORAL
  Filled 2016-04-29: qty 1

## 2016-04-29 MED ORDER — SODIUM CHLORIDE 0.9 % IV BOLUS (SEPSIS)
1000.0000 mL | Freq: Once | INTRAVENOUS | Status: AC
  Administered 2016-04-29: 1000 mL via INTRAVENOUS

## 2016-04-29 MED ORDER — ESTRADIOL 1 MG PO TABS
0.5000 mg | ORAL_TABLET | Freq: Every day | ORAL | Status: DC
  Administered 2016-04-30 – 2016-05-02 (×3): 0.5 mg via ORAL
  Filled 2016-04-29 (×2): qty 1
  Filled 2016-04-29: qty 0.5

## 2016-04-29 MED ORDER — ENOXAPARIN SODIUM 40 MG/0.4ML ~~LOC~~ SOLN
40.0000 mg | SUBCUTANEOUS | Status: DC
  Administered 2016-04-30 – 2016-05-01 (×2): 40 mg via SUBCUTANEOUS
  Filled 2016-04-29 (×2): qty 0.4

## 2016-04-29 MED ORDER — FLUTICASONE PROPIONATE 50 MCG/ACT NA SUSP
2.0000 | Freq: Every day | NASAL | Status: DC
  Administered 2016-04-30 – 2016-05-02 (×3): 2 via NASAL
  Filled 2016-04-29: qty 16

## 2016-04-29 MED ORDER — AMLODIPINE BESYLATE 5 MG PO TABS
5.0000 mg | ORAL_TABLET | Freq: Every day | ORAL | Status: DC
  Administered 2016-04-30 – 2016-05-02 (×3): 5 mg via ORAL
  Filled 2016-04-29 (×3): qty 1

## 2016-04-29 MED ORDER — ACETAMINOPHEN 325 MG PO TABS
ORAL_TABLET | ORAL | Status: AC
  Filled 2016-04-29: qty 2

## 2016-04-29 MED ORDER — INSULIN ASPART 100 UNIT/ML ~~LOC~~ SOLN
0.0000 [IU] | Freq: Every day | SUBCUTANEOUS | Status: DC
  Administered 2016-04-29 – 2016-05-01 (×2): 3 [IU] via SUBCUTANEOUS
  Filled 2016-04-29 (×2): qty 3

## 2016-04-29 MED ORDER — METOPROLOL SUCCINATE ER 50 MG PO TB24
50.0000 mg | ORAL_TABLET | Freq: Every day | ORAL | Status: DC
  Administered 2016-04-30 – 2016-05-02 (×3): 50 mg via ORAL
  Filled 2016-04-29 (×3): qty 1

## 2016-04-29 MED ORDER — POTASSIUM CHLORIDE CRYS ER 20 MEQ PO TBCR
10.0000 meq | EXTENDED_RELEASE_TABLET | Freq: Every day | ORAL | Status: DC
  Administered 2016-04-30 – 2016-05-02 (×3): 10 meq via ORAL
  Filled 2016-04-29 (×3): qty 1

## 2016-04-29 MED ORDER — LORATADINE 10 MG PO TABS: 10.0000 mg | ORAL_TABLET | Freq: Every day | ORAL | Status: DC | PRN

## 2016-04-29 MED ORDER — LISINOPRIL 20 MG PO TABS
40.0000 mg | ORAL_TABLET | Freq: Two times a day (BID) | ORAL | Status: DC
  Administered 2016-04-29 – 2016-05-02 (×6): 40 mg via ORAL
  Filled 2016-04-29 (×6): qty 2

## 2016-04-29 MED ORDER — BENZONATATE 100 MG PO CAPS: 100.0000 mg | ORAL_CAPSULE | ORAL | Status: DC | PRN

## 2016-04-29 MED ORDER — ACETAMINOPHEN 325 MG PO TABS
650.0000 mg | ORAL_TABLET | Freq: Once | ORAL | Status: AC
  Administered 2016-04-29: 650 mg via ORAL

## 2016-04-29 MED ORDER — VALACYCLOVIR HCL 500 MG PO TABS
500.0000 mg | ORAL_TABLET | Freq: Three times a day (TID) | ORAL | Status: DC
  Filled 2016-04-29 (×2): qty 1

## 2016-04-29 MED ORDER — ONDANSETRON HCL 4 MG/2ML IJ SOLN: 4.0000 mg | Freq: Four times a day (QID) | INTRAMUSCULAR | Status: DC | PRN

## 2016-04-29 MED ORDER — ISOSORBIDE MONONITRATE ER 60 MG PO TB24
60.0000 mg | ORAL_TABLET | Freq: Every day | ORAL | Status: DC
  Administered 2016-04-30 – 2016-05-02 (×3): 60 mg via ORAL
  Filled 2016-04-29 (×3): qty 1

## 2016-04-29 NOTE — ED Notes (Signed)
Pt given Tylenol for c/o headache-pt reports Aspirin given previously does not provide relief for her headaches;

## 2016-04-29 NOTE — ED Notes (Signed)
Pharm tech Tiffany at bedside to reconcile medications

## 2016-04-29 NOTE — ED Notes (Signed)
When into room to check on patient, pt had an empty IV bag hanging.

## 2016-04-29 NOTE — H&P (Signed)
[facility] NAME: Dana Sparks    MR#:  [id number]  DATE OF BIRTH:  1951-07-28  DATE OF ADMISSION:  04/29/2016  PRIMARY CARE PHYSICIAN: Loistine Chance, MD   REQUESTING/REFERRING PHYSICIAN: Eula Listen, MD  CHIEF COMPLAINT:  Hyperglycemia  HISTORY OF PRESENT ILLNESS:  Dana Sparks  is a 65 y.o. female with a known history of oronary artery disease status post stents, diabetes mellitus on oral antiHypoglycemics, essential hypertension, hyperlipidemia and other medical problem is presenting to the ED with a chief complaint of high blood sugars. Today  patient'S her blood sugar was at 455 made her come to the hospital. She also describes that over the same period of time she has had progressively worsening and more frequent episodes of chest pain. Worse with exertion or feeling stressed.  Last night, she describes that she had 4 distinct episodes of substernal chest "pressure" that was severe and associated with diaphoresis. No radiation, shortness of breath, nausea or vomiting.  PAST MEDICAL HISTORY:   Past Medical History:  Diagnosis Date  . Allergy   . Anxiety   . Arthritis   . CAD (coronary artery disease)   . COPD (chronic obstructive pulmonary disease) ([place])   . Depression   . Diabetes mellitus without complication ([place])   . Diverticulitis   . Gastroparesis   . Gout   . Hyperlipemia   . Hypertension   . Positive H. pylori test   . Renal insufficiency   . Sleep apnea   . Thrombocytosis ([address]) 01/28/2015  . Tubular adenoma of colon 01/20/14    PAST SURGICAL HISTOIRY:   Past Surgical History:  Procedure Laterality Date  . ABDOMINAL HYSTERECTOMY     total  . CATARACT EXTRACTION    . COLONOSCOPY  01/2014   sigmoid diverticulosis. desc colon TA, hyperplastic polyp  . CORONARY ANGIOPLASTY WITH STENT PLACEMENT    . ESOPHAGOGASTRODUODENOSCOPY N/A 02/16/2015   negative h pylori, focal gastic intestinal metaplasia   . TOTAL KNEE ARTHROPLASTY Right     SOCIAL HISTORY:   Social History  Substance Use Topics  . Smoking status: Former Smoker    Packs/day: 1.00    Years: 40.00    Types: Cigarettes    Quit date: 12/30/2012  . Smokeless tobacco: Never Used  . Alcohol use No    FAMILY HISTORY:   Family History  Problem Relation Age of Onset  . Cancer Sister     unsure  . Alcohol abuse Brother   . Colon cancer Neg Hx   . Liver disease Neg Hx   . Breast cancer Neg Hx     DRUG ALLERGIES:   Allergies  Allergen Reactions  . Codeine Other (See Comments)    Unknown reaction  . Contrast Media [Iodinated Diagnostic Agents] Itching  . Sulfa Antibiotics Itching    REVIEW OF SYSTEMS:  CONSTITUTIONAL: No fever, fatigue or weakness.  EYES: No blurred or double vision.  EARS, NOSE, AND THROAT: No tinnitus or ear pain. Reporting dry mouth RESPIRATORY: No cough, shortness of breath, wheezing or hemoptysis.  CARDIOVASCULAR: No chest pain, orthopnea, edema.  GASTROINTESTINAL: No nausea, vomiting, diarrhea or abdominal pain.  GENITOURINARY: No dysuria, hematuria.  ENDOCRINE: No polyuria, nocturia,  HEMATOLOGY: No anemia, easy bruising or bleeding SKIN: No rash or lesion. MUSCULOSKELETAL: No joint pain or arthritis.   NEUROLOGIC: No tingling, numbness, weakness.  PSYCHIATRY: No anxiety or depression.   MEDICATIONS AT HOME:   Prior to Admission medications  Medication Sig Start Date End Date Taking? Authorizing Provider  allopurinol (ZYLOPRIM) 100 MG tablet Take 1 tablet (100 mg total) by mouth daily. Patient taking differently: Take 100 mg by mouth every morning.  11/22/15  Yes Steele Sizer, MD  amLODipine (NORVASC) 5 MG tablet TAKE 1 TABLET EVERY DAY Patient taking differently: TAKE 1 TABLET EVERY MORNING. 02/24/16  Yes Steele Sizer, MD  aspirin EC 81 MG tablet Take 81 mg by mouth every morning.    Yes Historical Provider, MD  benzonatate (TESSALON) 100 MG capsule Take 100 mg by mouth every  4 (four) hours as needed for cough. 02/28/16  Yes Historical Provider, MD  citalopram (CELEXA) 20 MG tablet Take 1 tablet (20 mg total) by mouth daily. Patient taking differently: Take 20 mg by mouth every morning.  11/22/15  Yes Steele Sizer, MD  Coenzyme Q10 (COQ10) 100 MG CAPS Take 1 capsule by mouth daily. 04/26/16  Yes Steele Sizer, MD  estradiol (ESTRACE) 0.5 MG tablet Take 1 tablet (0.5 mg total) by mouth daily. Patient taking differently: Take 0.5 mg by mouth daily as needed. For hot flashes. 06/18/15  Yes Steele Sizer, MD  ferrous sulfate 325 (65 FE) MG tablet Take 1 tablet (325 mg total) by mouth daily with breakfast. Patient taking differently: Take 325 mg by mouth 2 (two) times daily with a meal.  06/18/15  Yes Steele Sizer, MD  fexofenadine (ALLEGRA) 180 MG tablet Take 180 mg by mouth daily as needed for allergies.    Yes Historical Provider, MD  fluticasone (FLONASE) 50 MCG/ACT nasal spray Place 2 sprays into both nostrils daily. Patient taking differently: Place 2 sprays into both nostrils daily as needed for allergies.  03/13/16  Yes Steele Sizer, MD  glipiZIDE (GLUCOTROL) 5 MG tablet TAKE 2 TABLETS EVERY MORNING  AND TAKE 1 TABLET EVERY EVENING 02/24/16  Yes Steele Sizer, MD  isosorbide mononitrate (IMDUR) 60 MG 24 hr tablet TAKE 1 TABLET EVERY DAY Patient taking differently: TAKE 1 TABLET EVERY DAY IN THE MORNING. 02/24/16  Yes Steele Sizer, MD  lisinopril (PRINIVIL,ZESTRIL) 40 MG tablet TAKE 1 TABLET TWICE DAILY 02/24/16  Yes Steele Sizer, MD  loratadine (CLARITIN) 10 MG tablet Take 10 mg by mouth daily as needed for allergies or rhinitis.  11/15/15  Yes Historical Provider, MD  metoprolol succinate (TOPROL-XL) 50 MG 24 hr tablet Take 1 tablet (50 mg total) by mouth daily. Patient taking differently: Take 50 mg by mouth every morning.  06/18/15  Yes Steele Sizer, MD  pantoprazole (PROTONIX) 40 MG tablet TAKE 1 TABLET EVERY MORNING 04/25/16  Yes Steele Sizer, MD   potassium chloride (K-DUR,KLOR-CON) 10 MEQ tablet Take 1 tablet (10 mEq total) by mouth daily as needed. 06/18/15  Yes Steele Sizer, MD  rosuvastatin (CRESTOR) 20 MG tablet TAKE 1 TABLET EVERY DAY Patient taking differently: TAKE 1 TABLET EVERY MORNING. 04/04/16  Yes Steele Sizer, MD  sitaGLIPtin-metformin (JANUMET) 50-1000 MG tablet Take 1 tablet by mouth 2 (two) times daily. 03/13/16  Yes Steele Sizer, MD  torsemide (DEMADEX) 20 MG tablet Take 1 tablet (20 mg total) by mouth daily. Patient taking differently: Take 20 mg by mouth daily as needed (FOR FLUID.).  06/18/15  Yes Steele Sizer, MD  conjugated estrogens (PREMARIN) vaginal cream Place 1 Applicatorful vaginally daily. First two weeks every night and after that twice weekly Patient not taking: Reported on 04/29/2016 03/13/16   Steele Sizer, MD  valACYclovir (VALTREX) 500 MG tablet Take 1 tablet (500 mg total) by mouth 3 (  three) times daily. Patient not taking: Reported on 04/29/2016 02/02/16   Steele Sizer, MD      VITAL SIGNS:  Blood pressure (!) 131/54, pulse (!) 112, temperature 97.5 F (36.4 C), resp. rate 18, height 5\' 6"  (1.676 m), weight 98.9 kg (218 lb), SpO2 95 %.  PHYSICAL EXAMINATION:  GENERAL:  65 y.o.-year-old patient lying in the bed with no acute distress.  EYES: Pupils equal, round, reactive to light and accommodation. No scleral icterus. Extraocular muscles intact.  HEENT: Head atraumatic, normocephalic. Oropharynx and nasopharynx clear.  NECK:  Supple, no jugular venous distention. No thyroid enlargement, no tenderness.  LUNGS: Normal breath sounds bilaterally, no wheezing, rales,rhonchi or crepitation. No use of accessory muscles of respiration.  CARDIOVASCULAR: S1, S2 normal. No murmurs, rubs, or gallops.  ABDOMEN: Soft, nontender, nondistended. Bowel sounds present. No organomegaly or mass.  EXTREMITIES: No pedal edema, cyanosis, or clubbing.  NEUROLOGIC: Cranial nerves II through XII are intact. Muscle  strength 5/5 in all extremities. Sensation intact. Gait not checked.  PSYCHIATRIC: The patient is alert and oriented x 3.  SKIN: No obvious rash, lesion, or ulcer.   LABORATORY PANEL:   CBC  Recent Labs Lab 04/29/16 1641  WBC 6.5  HGB 12.9  HCT 39.2  PLT 270   ------------------------------------------------------------------------------------------------------------------  Chemistries   Recent Labs Lab 04/26/16 1657 04/29/16 1641  NA 136 137  K 5.0 4.3  CL 102 106  CO2 20 20*  GLUCOSE 422* 406*  BUN 16 17  CREATININE 0.96 0.96  CALCIUM 9.5 9.7  AST 15  --   ALT 16  --   ALKPHOS 55  --   BILITOT 0.2  --    ------------------------------------------------------------------------------------------------------------------  Cardiac Enzymes  Recent Labs Lab 04/29/16 1641  TROPONINI 0.09*   ------------------------------------------------------------------------------------------------------------------  RADIOLOGY:  Dg Chest 2 View  Result Date: 04/29/2016 CLINICAL DATA:  History COPD.  Diabetes. EXAM: CHEST  2 VIEW COMPARISON:  None. FINDINGS: The heart size and mediastinal contours are within normal limits. Both lungs are clear, apart from minimal atelectatic changes in the lung bases. The visualized skeletal structures are unremarkable. IMPRESSION: No active cardiopulmonary disease. Electronically Signed   By: Fidela Salisbury M.D.   On: 04/29/2016 19:50    EKG:   Orders placed or performed during the hospital encounter of 04/29/16  . ED EKG  . ED EKG  . EKG 12-Lead  . EKG 12-Lead    IMPRESSION AND PLAN:   Dana Sparks  is a 65 y.o. female with a known history of oronary artery disease status post stents, diabetes mellitus on oral antiHypoglycemics, essential hypertension, hyperlipidemia and other medical problem is presenting to the ED with a chief complaint of high blood sugars. Today  patient'S her blood sugar was at 455 made her come to the hospital.  She also describes that over the same period of time she has had progressively worsening and more frequent episodes of chest pain. Worse with exertion or feeling stressed.  Last night, she describes that she had 4 distinct episodes of substernal chest "pressure" that was severe and associated with diaphoresis   #Frequent episodes of chest pain with elevated troponin with history of coronary artery disease status post stent Admit to telemetry Cycle cardiac biomarkers Patient has significant risk factors hypertension, diverticulitis mellitus with hyperglycemia and hyperlipidemia, coronary artery disease status post stents Get echocardiogram Cardiology consult is placed to [facility] Nothing by mouth after midnight as troponin is abnormal Provide oxygen, aspirin, nitroglycerin, beta blocker and statin. Continue  home medication lisinopril and Imdur  Check fasting lipid panel in a.m.   #diabetes mellitus with hyperglycemia Check hemoglobin A1c and provide diabetic diet Nothing by mouth after midnight as troponin is elevated Patient needs tight control of her blood sugar   patient has received 2 L of IV fluids and 10 units of insulin in the emergency department Consult diabetic coordinator Currently holding her home medication metformin and Januvia  #Essential hypertension Continue her home medication beta blocker Toprol, lisinopril and Imdur  #hyperlipidemia Check fasting lipid panel and provide statin   Provide DVT prophylaxis with Lovenox subcutaneous        All the records are reviewed and case discussed with ED provider. Management plans discussed with the patient, family and they are in agreement.  CODE STATUS: Full code, all 3children are her healthcare power of attorney  TOTAL TIME TAKING CARE OF THIS PATIENT: 45 minutes.   Note: This dictation was prepared with Dragon dictation along with smaller phrase technology. Any transcriptional errors that result from this process are  unintentional.  Dana Sparks M.D on 04/29/2016 at 9:12 PM  Between 7am to 6pm - Pager - [phone]  After 6pm go to [email or web address] - password [email or web address] [facility]  Office  [phone]  CC: Primary care physician; Loistine Chance, MD

## 2016-04-29 NOTE — ED Notes (Signed)
Pt denies any chest pain currently family at bedside. C/o headache Dr. Mariea Clonts informed.

## 2016-04-29 NOTE — ED Triage Notes (Signed)
Was to pcp on Monday and sugars were over 400 and has been running high since then.

## 2016-04-29 NOTE — ED Notes (Signed)
Admitting MD at bedside.

## 2016-04-29 NOTE — ED Provider Notes (Addendum)
[facility] Emergency Department Provider Note  ____________________________________________  Time seen: Approximately 6:56 PM  I have reviewed the triage vital signs and the nursing notes.   HISTORY  Chief Complaint Hyperglycemia    HPI Dana Sparks is a 65 y.o. female with a history of HTN, HL, DM on oral anti-hyperglycemics, CAD status post stents presenting with hyperglycemia and chest pain. The patient reports that for the past 2-3 weeks she has been taking her medications as prescribed but her blood sugars have been elevated. Today her blood sugar was 455 and she became scared so came into the emergency department for further evaluation. She also describes that over the same period of time she has had progressively worsening and more frequent episodes of chest pain. Worse with exertion or feeling stressed.  Last night, she describes that she had 4 distinct episodes of substernal chest "pressure" that was severe and associated with diaphoresis. No radiation, shortness of breath, nausea or vomiting. The pain was so severe that she considered calling an ambulance. The patient also reports dry mouth, polyuria and polydipsia. She denies any recent illness including fever, nausea vomiting or diarrhea, cough or cold symptoms. She denies any lower extremity swelling or calf pain.   Past Medical History:  Diagnosis Date  . Allergy   . Anxiety   . Arthritis   . CAD (coronary artery disease)   . COPD (chronic obstructive pulmonary disease) ([place])   . Depression   . Diabetes mellitus without complication ([place])   . Diverticulitis   . Gastroparesis   . Gout   . Hyperlipemia   . Hypertension   . Positive H. pylori test   . Renal insufficiency   . Sleep apnea   . Thrombocytosis ([place]) 01/28/2015  . Tubular adenoma of colon 01/20/14    Patient Active Problem List   Diagnosis Date Noted  . Elevated antinuclear antibody (ANA) level 04/27/2016  . Angina pectoris ([place])  11/01/2015  . Well controlled type 2 diabetes mellitus with gastroparesis ([place]) 06/22/2015  . Low TSH level 06/22/2015  . Iron deficiency anemia 04/07/2015  . Intestinal metaplasia of gastric mucosa 03/11/2015  . CAD (coronary artery disease), native coronary artery 02/24/2015  . History of coronary artery stent placement 02/24/2015  . Chronic kidney disease, stage 3, mod decreased GFR 02/24/2015  . Menopause 02/24/2015  . History of Helicobacter pylori infection 02/24/2015  . Mild mitral insufficiency 02/24/2015  . Mild tricuspid insufficiency 02/24/2015  . 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 ([place]) 02/24/2015  . Controlled gout 02/24/2015  . Depression, major, recurrent, mild ([place]) 02/24/2015  . Morbid obesity due to excess calories ([place]) 02/24/2015  . Mild pulmonary hypertension ([place]) 02/24/2015  . Gastroesophageal reflux disease without esophagitis 02/24/2015  . Perennial allergic rhinitis 02/24/2015  . HLD (hyperlipidemia) 02/20/2015  . Hypertension, benign 02/20/2015  . Apnea, sleep 02/20/2015  . Controlled diabetes mellitus with stage 3 chronic kidney disease, without long-term current use of insulin ([place]) 02/20/2015  . Thrombocytosis ([place]) 01/28/2015    Past Surgical History:  Procedure Laterality Date  . ABDOMINAL HYSTERECTOMY     total  . CATARACT EXTRACTION    . COLONOSCOPY  01/2014   sigmoid diverticulosis. desc colon TA, hyperplastic polyp  . CORONARY ANGIOPLASTY WITH STENT PLACEMENT    . ESOPHAGOGASTRODUODENOSCOPY N/A 02/16/2015   negative h pylori, focal gastic intestinal metaplasia  . TOTAL KNEE ARTHROPLASTY Right     Current Outpatient Rx  . Order #: [id number] Class: Print  .  Order #: [id number] Class: Normal  . Order #: [id number] Class: Historical Med  . Order #: [id number] Class: Print  . Order #: [id number] Class: Normal  . Order #: [id number] Class: Normal  . Order #: [id number] Class: Print  . Order #: [id number] Class: Print  . Order  #: [id number] Class: Historical Med  . Order #: [id number] Class: Normal  . Order #: [id number] Class: Normal  . Order #: [id number] Class: Normal  . Order #: [id number] Class: Normal  . Order #: [id number] Class: Historical Med  . Order #: [id number] Class: Normal  . Order #: [id number] Class: Normal  . Order #: [id number] Class: Print  . Order #: [id number] Class: Normal  . Order #: [id number] Class: Normal  . Order #: [id number] Class: Print  . Order #: [id number] Class: Normal    Allergies Codeine; Contrast media [iodinated diagnostic agents]; and Sulfa antibiotics  Family History  Problem Relation Age of Onset  . Cancer Sister     unsure  . Alcohol abuse Brother   . Colon cancer Neg Hx   . Liver disease Neg Hx   . Breast cancer Neg Hx     Social History Social History  Substance Use Topics  . Smoking status: Former Smoker    Packs/day: 1.00    Years: 40.00    Types: Cigarettes    Quit date: 12/30/2012  . Smokeless tobacco: Never Used  . Alcohol use No    Review of Systems Constitutional: No fever/chills.No lightheadedness or syncope. Positive diaphoresis. Eyes: No visual changes. No eye discharge. ENT: No sore throat. No congestion or rhinorrhea. Cardiovascular: Positive chest pain. Denies palpitations. Respiratory: Denies shortness of breath.  No cough. Gastrointestinal: No abdominal pain.  No nausea, no vomiting.  No diarrhea.  No constipation. Genitourinary: Negative for dysuria. Musculoskeletal: Negative for back pain. Skin: Negative for rash. Neurological: Negative for headaches. No focal numbness, tingling or weakness.   10-point ROS otherwise negative.  ____________________________________________   PHYSICAL EXAM:  VITAL SIGNS: ED Triage Vitals  Enc Vitals Group     BP 04/29/16 1637 119/79     Pulse Rate 04/29/16 1637 (!) 106     Resp 04/29/16 1637 18     Temp 04/29/16 1637 97.5 F (36.4 C)     Temp src --      SpO2 04/29/16 1637 97 %     Weight 04/29/16 1638 218  lb (98.9 kg)     Height 04/29/16 1638 5\' 6"  (1.676 m)     Head Circumference --      Peak Flow --      Pain Score 04/29/16 1638 0     Pain Loc --      Pain Edu? --      Excl. in [place]? --     Constitutional: Alert and oriented. Well appearing and in no acute distress. Answers questions appropriately. Eyes: Conjunctivae are normal.  EOMI. No scleral icterus. Head: Atraumatic. Nose: No congestion/rhinnorhea. Mouth/Throat: Mucous membranes are moist.  Neck: No stridor.  Supple.  No JVD. Cardiovascular: Normal rate, regular rhythm. No murmurs, rubs or gallops.  Respiratory: Normal respiratory effort.  No accessory muscle use or retractions. Lungs CTAB.  No wheezes, rales or ronchi. Gastrointestinal: Obese. Soft, nontender and nondistended.  No guarding or rebound.  No peritoneal signs. Musculoskeletal: No LE edema. No ttp in the calves or palpable cords.  Negative Homan's sign. Neurologic:  A&Ox3.  Speech is clear.  Face and smile are symmetric.  EOMI.  Moves all extremities well. Skin:  Skin is warm, dry and intact. No rash noted. Psychiatric: Mood and affect  are normal. Speech and behavior are normal.  Normal judgement.  ____________________________________________   LABS (all labs ordered are listed, but only abnormal results are displayed)  Labs Reviewed  BASIC METABOLIC PANEL - Abnormal; Notable for the following:       Result Value   CO2 20 (*)    Glucose, Bld 406 (*)    All other components within normal limits  CBC - Abnormal; Notable for the following:    RDW 16.5 (*)    All other components within normal limits  URINALYSIS COMPLETEWITH MICROSCOPIC ([facility] ONLY) - Abnormal; Notable for the following:    Color, Urine YELLOW (*)    APPearance CLEAR (*)    Glucose, UA >500 (*)    Ketones, ur TRACE (*)    Bacteria, UA RARE (*)    Squamous Epithelial / LPF 0-5 (*)    All other components within normal limits  GLUCOSE, CAPILLARY - Abnormal; Notable for the following:     Glucose-Capillary 400 (*)    All other components within normal limits  GLUCOSE, CAPILLARY - Abnormal; Notable for the following:    Glucose-Capillary 371 (*)    All other components within normal limits  TROPONIN I  CBG MONITORING, ED   ____________________________________________  EKG  ED ECG REPORT I, Eula Listen, the attending physician, personally viewed and interpreted this ECG.   Date: 04/29/2016  EKG Time: 1916  Rate: 104  Rhythm: sinus tachycardia  Axis: normal  Intervals:none  ST&T Change: no STEMi  ____________________________________________  RADIOLOGY  No results found.  ____________________________________________   PROCEDURES  Procedure(s) performed: None  Procedures  Critical Care performed: No ____________________________________________   INITIAL IMPRESSION / ASSESSMENT AND PLAN / ED COURSE  Pertinent labs & imaging results that were available during my care of the patient were reviewed by me and considered in my medical decision making (see chart for details).  65 y.o. female with a history of diabetes and coronary artery disease presenting with 2-3 weeks of progressively worsening and more severe chest pain episodes associated with new episodes of hyperglycemia. Overall, the patient is well-appearing with stable vital signs. She does not have any acute cardiopulmonary findings.  Her blood sugars in the 400s but she does not have DKA. We will work on decreasing her blood sugar. I am concerned given her history and her symptoms about a cardiac cause for her hyperglycemia. She will need evaluation with a troponin in the emergency department and admission for risk stratification study as it has been greater than 2 years since her last cardiac catheterization or stress test.  ____________________________________________  FINAL CLINICAL IMPRESSION(S) / ED DIAGNOSES  Final diagnoses:  None    Clinical Course      NEW MEDICATIONS  STARTED DURING THIS VISIT:  New Prescriptions   No medications on file      Eula Listen, MD 04/29/16 1926    Eula Listen, MD 04/29/16 1930

## 2016-04-29 NOTE — ED Notes (Signed)
Pt back from x-ray.

## 2016-04-29 NOTE — ED Notes (Signed)
Lab called to report Troponin 0.09; notified Dr Margaretmary Eddy of same; acknowledged;

## 2016-04-29 NOTE — ED Triage Notes (Signed)
States bs was 455 at home. Only takes pills, no coverage.

## 2016-04-29 NOTE — ED Notes (Signed)
Pt given sandwich to eat as allowed earlier by Dr Mariea Clonts; call bell in reach; pt understands waiting for admitting orders so bed assignment can be obtained a pt moved to the floor

## 2016-04-29 NOTE — ED Notes (Signed)
Pt requesting something to eat; will ask admitting MD when available (currenlty at the bedside of another pt); pt understands diet order needed before I can give her something to eat

## 2016-04-29 NOTE — ED Notes (Signed)
In to meet pt; family present in room but pt has just gone to xray

## 2016-04-30 ENCOUNTER — Observation Stay
Admit: 2016-04-30 | Discharge: 2016-04-30 | Disposition: A | Discharge status: Home / Self Care | Payer: Commercial Managed Care - HMO | Attending: Internal Medicine | Admitting: Internal Medicine

## 2016-04-30 DIAGNOSIS — E119 Type 2 diabetes mellitus without complications: Secondary | ICD-10-CM | POA: Diagnosis not present

## 2016-04-30 DIAGNOSIS — I10 Essential (primary) hypertension: Secondary | ICD-10-CM | POA: Diagnosis not present

## 2016-04-30 DIAGNOSIS — I2510 Atherosclerotic heart disease of native coronary artery without angina pectoris: Secondary | ICD-10-CM | POA: Diagnosis not present

## 2016-04-30 DIAGNOSIS — R748 Abnormal levels of other serum enzymes: Secondary | ICD-10-CM | POA: Diagnosis not present

## 2016-04-30 DIAGNOSIS — R079 Chest pain, unspecified: Secondary | ICD-10-CM | POA: Diagnosis not present

## 2016-04-30 LAB — LIPID PANEL
CHOL/HDL RATIO: 2.3 ratio
Cholesterol: 65 mg/dL (ref 0–200)
HDL: 28 mg/dL — AB (ref >40)
LDL CALC: 26 mg/dL (ref 0–99)
TRIGLYCERIDES: 57 mg/dL (ref <150)
VLDL: 11 mg/dL (ref 0–40)

## 2016-04-30 LAB — ECHOCARDIOGRAM COMPLETE
HEIGHTINCHES: 66 in
Weight: 3507.2 oz

## 2016-04-30 LAB — GLUCOSE, CAPILLARY
GLUCOSE-CAPILLARY: 381 mg/dL — AB (ref 65–99)
GLUCOSE-CAPILLARY: 134 mg/dL — AB (ref 65–99)
Glucose-Capillary: 302 mg/dL — ABNORMAL HIGH (ref 65–99)
Glucose-Capillary: 281 mg/dL — ABNORMAL HIGH (ref 65–99)

## 2016-04-30 LAB — TROPONIN I
TROPONIN I: 0.06 ng/mL — AB (ref <0.03)
Troponin I: <0.03 ng/mL (ref <0.03)

## 2016-04-30 LAB — HEMOGLOBIN A1C: HEMOGLOBIN A1C: 8.3 % — AB (ref 4.0–6.0)

## 2016-04-30 MED ORDER — GLIPIZIDE 5 MG PO TABS
5.0000 mg | ORAL_TABLET | Freq: Two times a day (BID) | ORAL | Status: DC
  Administered 2016-04-30 – 2016-05-01 (×2): 5 mg via ORAL
  Filled 2016-04-30 (×2): qty 1

## 2016-04-30 MED ORDER — METFORMIN HCL 500 MG PO TABS
1000.0000 mg | ORAL_TABLET | Freq: Two times a day (BID) | ORAL | Status: DC
  Administered 2016-04-30 – 2016-05-02 (×4): 1000 mg via ORAL
  Filled 2016-04-30 (×4): qty 2

## 2016-04-30 MED ORDER — LINAGLIPTIN 5 MG PO TABS
5.0000 mg | ORAL_TABLET | Freq: Every day | ORAL | Status: DC
  Administered 2016-04-30 – 2016-05-02 (×3): 5 mg via ORAL
  Filled 2016-04-30 (×3): qty 1

## 2016-04-30 MED ORDER — TRAMADOL HCL 50 MG PO TABS
50.0000 mg | ORAL_TABLET | Freq: Four times a day (QID) | ORAL | Status: DC | PRN
  Administered 2016-04-30 – 2016-05-02 (×3): 50 mg via ORAL
  Filled 2016-04-30 (×3): qty 1

## 2016-04-30 MED ORDER — KETOROLAC TROMETHAMINE 15 MG/ML IJ SOLN
15.0000 mg | Freq: Once | INTRAMUSCULAR | Status: AC
  Administered 2016-04-30: 15 mg via INTRAVENOUS
  Filled 2016-04-30: qty 1

## 2016-04-30 MED ORDER — ALUM & MAG HYDROXIDE-SIMETH 200-200-20 MG/5ML PO SUSP: 30.0000 mL | Freq: Four times a day (QID) | ORAL | Status: DC | PRN

## 2016-04-30 NOTE — Progress Notes (Signed)
Patient is admitted to room 258 with the diagnosis of hyperglycemia.  Alert and oriented x 4. No respiratory distress noted. Tele box called to CCMD with Gelene Mink. as  a second verifier. Skin assessment done with Adrienne as well, no skin issues of concern noted.  Daughter is at bedside at this moment,  voiced no  Complaint. Will continue to monitor.

## 2016-04-30 NOTE — Progress Notes (Signed)
[facility] [place] NAME: Dana Sparks    MR#:  [id number]  DATE OF BIRTH:  1951/07/05  SUBJECTIVE:  CHIEF COMPLAINT:   Chief Complaint  Patient presents with  . Hyperglycemia   - Admitted with chest pain. Also complains of heartburn. -Sugars have been elevated. a1c pending - dry mouth as outpatient and also headache  REVIEW OF SYSTEMS:  Review of Systems  Constitutional: Positive for malaise/fatigue. Negative for chills and fever.  HENT: Negative for congestion, ear discharge, ear pain and nosebleeds.   Eyes: Negative for blurred vision and double vision.  Respiratory: Negative for cough, shortness of breath and wheezing.   Cardiovascular: Positive for chest pain. Negative for palpitations and leg swelling.  Gastrointestinal: Positive for heartburn. Negative for abdominal pain, constipation, diarrhea, nausea and vomiting.  Genitourinary: Negative for dysuria and urgency.  Musculoskeletal: Negative for myalgias and neck pain.  Neurological: Positive for headaches. Negative for dizziness, sensory change, speech change, focal weakness and seizures.  Psychiatric/Behavioral: Negative for depression.    DRUG ALLERGIES:   Allergies  Allergen Reactions  . Codeine Other (See Comments)    Unknown reaction  . Contrast Media [Iodinated Diagnostic Agents] Itching  . Sulfa Antibiotics Itching    VITALS:  Blood pressure (!) 131/55, pulse 100, temperature 98 F (36.7 C), temperature source Oral, resp. rate 20, height 5\' 6"  (1.676 m), weight 99.4 kg (219 lb 3.2 oz), SpO2 97 %.  PHYSICAL EXAMINATION:  Physical Exam  GENERAL:  65 y.o.-year-old patient lying in the bed with no acute distress.  EYES: Pupils equal, round, reactive to light and accommodation. No scleral icterus. Extraocular muscles intact.  HEENT: Head atraumatic, normocephalic. Oropharynx and nasopharynx clear.  NECK:  Supple, no jugular venous distention. No thyroid  enlargement, no tenderness.  LUNGS: Normal breath sounds bilaterally, no wheezing, rales,rhonchi or crepitation. No use of accessory muscles of respiration.  CARDIOVASCULAR: S1, S2 normal. No murmurs, rubs, or gallops. Tenderness on palpation in the costochondral junction bilaterally. ABDOMEN: Soft, nontender, nondistended. Bowel sounds present. No organomegaly or mass.  EXTREMITIES: No pedal edema, cyanosis, or clubbing.  NEUROLOGIC: Cranial nerves II through XII are intact. Muscle strength 5/5 in all extremities. Sensation intact. Gait not checked.  PSYCHIATRIC: The patient is alert and oriented x 3.  SKIN: No obvious rash, lesion, or ulcer.    LABORATORY PANEL:   CBC  Recent Labs Lab 04/29/16 1641  WBC 6.5  HGB 12.9  HCT 39.2  PLT 270   ------------------------------------------------------------------------------------------------------------------  Chemistries   Recent Labs Lab 04/26/16 1657 04/29/16 1641  NA 136 137  K 5.0 4.3  CL 102 106  CO2 20 20*  GLUCOSE 422* 406*  BUN 16 17  CREATININE 0.96 0.96  CALCIUM 9.5 9.7  AST 15  --   ALT 16  --   ALKPHOS 55  --   BILITOT 0.2  --    ------------------------------------------------------------------------------------------------------------------  Cardiac Enzymes  Recent Labs Lab 04/30/16 0449  TROPONINI <0.03   ------------------------------------------------------------------------------------------------------------------  RADIOLOGY:  Dg Chest 2 View  Result Date: 04/29/2016 CLINICAL DATA:  History COPD.  Diabetes. EXAM: CHEST  2 VIEW COMPARISON:  None. FINDINGS: The heart size and mediastinal contours are within normal limits. Both lungs are clear, apart from minimal atelectatic changes in the lung bases. The visualized skeletal structures are unremarkable. IMPRESSION: No active cardiopulmonary disease. Electronically Signed   By: Fidela Salisbury M.D.   On: 04/29/2016 19:50    EKG:   Orders  placed or performed during the hospital encounter of 04/29/16  . ED EKG  . ED EKG  . EKG 12-Lead  . EKG 12-Lead    ASSESSMENT AND PLAN:   65 year old female with past medical history significant for coronary artery disease status post stent in the remote past, diabetes mellitus, hypertension presents to hospital secondary to elevated blood sugars and also chest pain.  #1 chest pain-likely stable angina. Troponins slightly elevated. -Appreciate cardiology consult. -Also has tenderness on palpation-likely costochondritis. -Myoview tomorrow morning -Continue cardiac medications. -Stress test last year negative  #2 uncontrolled diabetes mellitus-check A1c. Restart home medications. Patient only on oral anti-hyperglycemics. -Sliding scale insulin while in the hospital.  #3 hypertension-continue home medications which include Norvasc, Imdur, Toprol and lisinopril.  #4 GERD-continue Protonix. Add Maalox as needed  #5 DVT prophylaxis-on Lovenox    All the records are reviewed and case discussed with Care Management/Social Workerr. Management plans discussed with the patient, family and they are in agreement.  CODE STATUS:Full code  TOTAL TIME TAKING CARE OF THIS PATIENT: 37 minutes.   POSSIBLE D/C IN 1-2 DAYS, DEPENDING ON CLINICAL CONDITION.   Gladstone Lighter M.D on 04/30/2016 at 12:09 PM  Between 7am to 6pm - Pager - [phone]  After 6pm go to [email or web address] - password [email or web address] [facility]  Office  [phone]  CC: Primary care physician; Loistine Chance, MD

## 2016-04-30 NOTE — Progress Notes (Signed)
*  PRELIMINARY RESULTS* Echocardiogram 2D Echocardiogram has been performed.  Dana Sparks 04/30/2016, 8:23 AM

## 2016-04-30 NOTE — Consult Note (Signed)
[name] CONSULT NOTE  Patient ID: Dana Sparks MRN: [id number] DOB/AGE: Mar 29, 1951 65 y.o.  Admit date: 04/29/2016 Referring Physician Dr. Tressia Miners Primary Physician Dr. Ancil Boozer Primary Cardiologist Dr. Clayborn Bigness Reason for Consultation chest pain  HPI: Pt with history of cad s/p pci in the past who was admitted after presenting to the hospital with high glucose which caused her to present. She also has note intermitant chest pain. Thes symptoms occur with  She had a mild troponin elevation at 0.09 which has improved. She also has hypertension, hyperlipidemia. EKG is unremarkable. She is currently pain free. She is compliant with her medicaitons. She quit smoking in 2014.She also complains of a headache.   Review of Systems  Constitutional: Negative.   Eyes: Negative.   Respiratory: Negative.   Cardiovascular: Positive for chest pain.  Gastrointestinal: Positive for heartburn.  Genitourinary: Negative.   Musculoskeletal: Negative.   Skin: Negative.   Neurological: Positive for headaches.  Endo/Heme/Allergies: Negative.   Psychiatric/Behavioral: Negative.     Past Medical History:  Diagnosis Date  . Allergy   . Anxiety   . Arthritis   . CAD (coronary artery disease)   . COPD (chronic obstructive pulmonary disease) ([place])   . Depression   . Diabetes mellitus without complication ([place])   . Diverticulitis   . Gastroparesis   . Gout   . Hyperlipemia   . Hypertension   . Positive H. pylori test   . Renal insufficiency   . Sleep apnea   . Thrombocytosis ([place]) 01/28/2015  . Tubular adenoma of colon 01/20/14    Family History  Problem Relation Age of Onset  . Cancer Sister     unsure  . Alcohol abuse Brother   . Colon cancer Neg Hx   . Liver disease Neg Hx   . Breast cancer Neg Hx     Social History   Social History  . Marital status: Married    Spouse name: N/A  . Number of children: N/A  . Years of education:  N/A   Occupational History  . Not on file.   Social History Main Topics  . Smoking status: Former Smoker    Packs/day: 1.00    Years: 40.00    Types: Cigarettes    Quit date: 12/30/2012  . Smokeless tobacco: Never Used  . Alcohol use No  . Drug use: No  . Sexual activity: Not Currently   Other Topics Concern  . Not on file   Social History Narrative  . No narrative on file    Past Surgical History:  Procedure Laterality Date  . ABDOMINAL HYSTERECTOMY     total  . CATARACT EXTRACTION    . COLONOSCOPY  01/2014   sigmoid diverticulosis. desc colon TA, hyperplastic polyp  . CORONARY ANGIOPLASTY WITH STENT PLACEMENT    . ESOPHAGOGASTRODUODENOSCOPY N/A 02/16/2015   negative h pylori, focal gastic intestinal metaplasia  . TOTAL KNEE ARTHROPLASTY Right      Prescriptions Prior to Admission  Medication Sig Dispense Refill Last Dose  . allopurinol (ZYLOPRIM) 100 MG tablet Take 1 tablet (100 mg total) by mouth daily. (Patient taking differently: Take 100 mg by mouth every morning. ) 90 tablet 3 04/29/2016 at Unknown time  . amLODipine (NORVASC) 5 MG tablet TAKE 1 TABLET EVERY DAY (Patient taking differently: TAKE 1 TABLET EVERY MORNING.) 90 tablet 1 04/29/2016 at Unknown time  . aspirin EC 81 MG tablet Take 81 mg by mouth every morning.  04/29/2016 at 0800  . benzonatate (TESSALON) 100 MG capsule Take 100 mg by mouth every 4 (four) hours as needed for cough.   UNKNOWN  . citalopram (CELEXA) 20 MG tablet Take 1 tablet (20 mg total) by mouth daily. (Patient taking differently: Take 20 mg by mouth every morning. ) 90 tablet 3 04/29/2016 at Unknown time  . Coenzyme Q10 (COQ10) 100 MG CAPS Take 1 capsule by mouth daily. 90 each 1 HAVNE'T STARTED YET  . estradiol (ESTRACE) 0.5 MG tablet Take 1 tablet (0.5 mg total) by mouth daily. (Patient taking differently: Take 0.5 mg by mouth daily as needed. For hot flashes.) 90 tablet 3 Past Month at Unknown time  . ferrous sulfate 325 (65 FE) MG tablet  Take 1 tablet (325 mg total) by mouth daily with breakfast. (Patient taking differently: Take 325 mg by mouth 2 (two) times daily with a meal. ) 90 tablet 3 04/29/2016 at 0800  . fexofenadine (ALLEGRA) 180 MG tablet Take 180 mg by mouth daily as needed for allergies.    Past Month at Unknown time  . fluticasone (FLONASE) 50 MCG/ACT nasal spray Place 2 sprays into both nostrils daily. (Patient taking differently: Place 2 sprays into both nostrils daily as needed for allergies. ) 48 g 1 Past Month at Unknown time  . glipiZIDE (GLUCOTROL) 5 MG tablet TAKE 2 TABLETS EVERY MORNING  AND TAKE 1 TABLET EVERY EVENING 270 tablet 0 04/29/2016 at Unknown time  . isosorbide mononitrate (IMDUR) 60 MG 24 hr tablet TAKE 1 TABLET EVERY DAY (Patient taking differently: TAKE 1 TABLET EVERY DAY IN THE MORNING.) 90 tablet 1 04/29/2016 at Unknown time  . lisinopril (PRINIVIL,ZESTRIL) 40 MG tablet TAKE 1 TABLET TWICE DAILY 180 tablet 4 04/29/2016 at Unknown time  . loratadine (CLARITIN) 10 MG tablet Take 10 mg by mouth daily as needed for allergies or rhinitis.    Past Month at Unknown time  . metoprolol succinate (TOPROL-XL) 50 MG 24 hr tablet Take 1 tablet (50 mg total) by mouth daily. (Patient taking differently: Take 50 mg by mouth every morning. ) 90 tablet 3 04/29/2016 at 0800  . pantoprazole (PROTONIX) 40 MG tablet TAKE 1 TABLET EVERY MORNING 90 tablet 1 04/29/2016 at Unknown time  . potassium chloride (K-DUR,KLOR-CON) 10 MEQ tablet Take 1 tablet (10 mEq total) by mouth daily as needed. 90 tablet 3 Past Month at Unknown time  . rosuvastatin (CRESTOR) 20 MG tablet TAKE 1 TABLET EVERY DAY (Patient taking differently: TAKE 1 TABLET EVERY MORNING.) 90 tablet 3 04/29/2016 at Unknown time  . sitaGLIPtin-metformin (JANUMET) 50-1000 MG tablet Take 1 tablet by mouth 2 (two) times daily. 120 tablet 1 04/29/2016 at Unknown time  . torsemide (DEMADEX) 20 MG tablet Take 1 tablet (20 mg total) by mouth daily. (Patient taking differently:  Take 20 mg by mouth daily as needed (FOR FLUID.). ) 90 tablet 3 Past Month at Unknown time  . conjugated estrogens (PREMARIN) vaginal cream Place 1 Applicatorful vaginally daily. First two weeks every night and after that twice weekly (Patient not taking: Reported on 04/29/2016) 90 g 1 Not Taking at Unknown time  . valACYclovir (VALTREX) 500 MG tablet Take 1 tablet (500 mg total) by mouth 3 (three) times daily. (Patient not taking: Reported on 04/29/2016) 21 tablet 0 Not Taking at Unknown time    Physical Exam: Blood pressure (!) 131/55, pulse 100, temperature 98 F (36.7 C), temperature source Oral, resp. rate 20, height 5\' 6"  (1.676 m), weight 99.4 kg (219  lb 3.2 oz), SpO2 97 %.   Wt Readings from Last 1 Encounters:  04/29/16 99.4 kg (219 lb 3.2 oz)     General appearance: alert and cooperative Head: Normocephalic, without obvious abnormality, atraumatic Back: symmetric, no curvature. ROM normal. No CVA tenderness. Resp: clear to auscultation bilaterally Cardio: regular rate and rhythm GI: soft, non-tender; bowel sounds normal; no masses,  no organomegaly Neurologic: Grossly normal  Labs:   Lab Results  Component Value Date   WBC 6.5 04/29/2016   HGB 12.9 04/29/2016   HCT 39.2 04/29/2016   MCV 90.5 04/29/2016   PLT 270 04/29/2016    Recent Labs Lab 04/26/16 1657 04/29/16 1641  NA 136 137  K 5.0 4.3  CL 102 106  CO2 20 20*  BUN 16 17  CREATININE 0.96 0.96  CALCIUM 9.5 9.7  PROT 7.0  --   BILITOT 0.2  --   ALKPHOS 55  --   ALT 16  --   AST 15  --   GLUCOSE 422* 406*   Lab Results  Component Value Date   CKTOTAL 62 04/26/2016   CKMB <0.7 04/26/2016   TROPONINI <0.03 04/30/2016      Radiology: No acute cardiopulmonary disease EKG: nsr with no ischemia  ASSESSMENT AND PLAN:  Pt with history of cad s/p pci in the past followed by Dr. Clayborn Bigness who presented to the er with hyperglycemia and also mentioned that she has been having chest pain. She had a mild tropoinin  elevation which has improved. EKG was not significantly abnormal. She is pain free at present. Will proceed with funcitonal study in am and if significant ischemia is noted, will preoceed with cath. Continue with current meds. Signed: Teodoro Spray MD, [facility] 04/30/2016, 7:26 PM

## 2016-05-01 ENCOUNTER — Other Ambulatory Visit: Payer: Self-pay | Admitting: *Deleted

## 2016-05-01 ENCOUNTER — Observation Stay: Payer: Commercial Managed Care - HMO

## 2016-05-01 ENCOUNTER — Encounter: Payer: Self-pay | Admitting: *Deleted

## 2016-05-01 DIAGNOSIS — R079 Chest pain, unspecified: Secondary | ICD-10-CM | POA: Diagnosis not present

## 2016-05-01 DIAGNOSIS — R748 Abnormal levels of other serum enzymes: Secondary | ICD-10-CM | POA: Diagnosis not present

## 2016-05-01 DIAGNOSIS — I2510 Atherosclerotic heart disease of native coronary artery without angina pectoris: Secondary | ICD-10-CM | POA: Diagnosis not present

## 2016-05-01 DIAGNOSIS — I10 Essential (primary) hypertension: Secondary | ICD-10-CM | POA: Diagnosis not present

## 2016-05-01 DIAGNOSIS — E119 Type 2 diabetes mellitus without complications: Secondary | ICD-10-CM | POA: Diagnosis not present

## 2016-05-01 LAB — BASIC METABOLIC PANEL
Anion gap: 8 (ref 5–15)
BUN: 19 mg/dL (ref 6–20)
CHLORIDE: 104 mmol/L (ref 101–111)
CO2: 24 mmol/L (ref 22–32)
CREATININE: 0.66 mg/dL (ref 0.44–1.00)
Calcium: 9.1 mg/dL (ref 8.9–10.3)
GFR calc Af Amer: >60 mL/min (ref >60)
GFR calc non Af Amer: >60 mL/min (ref >60)
GLUCOSE: 322 mg/dL — AB (ref 65–99)
POTASSIUM: 3.6 mmol/L (ref 3.5–5.1)
SODIUM: 136 mmol/L (ref 135–145)

## 2016-05-01 LAB — GLUCOSE, CAPILLARY
GLUCOSE-CAPILLARY: 326 mg/dL — AB (ref 65–99)
GLUCOSE-CAPILLARY: 254 mg/dL — AB (ref 65–99)
Glucose-Capillary: 215 mg/dL — ABNORMAL HIGH (ref 65–99)
Glucose-Capillary: 348 mg/dL — ABNORMAL HIGH (ref 65–99)

## 2016-05-01 MED ORDER — REGADENOSON 0.4 MG/5ML IV SOLN
0.4000 mg | Freq: Once | INTRAVENOUS | Status: AC
  Administered 2016-05-01: 0.4 mg via INTRAVENOUS
  Filled 2016-05-01: qty 5

## 2016-05-01 MED ORDER — INSULIN GLARGINE 100 UNIT/ML ~~LOC~~ SOLN
20.0000 [IU] | Freq: Every day | SUBCUTANEOUS | Status: DC
  Administered 2016-05-01: 20 [IU] via SUBCUTANEOUS
  Filled 2016-05-01 (×2): qty 0.2

## 2016-05-01 MED ORDER — POLYETHYLENE GLYCOL 3350 17 G PO PACK: 17.0000 g | PACK | Freq: Every day | ORAL | Status: DC | PRN

## 2016-05-01 MED ORDER — DOCUSATE SODIUM 100 MG PO CAPS
100.0000 mg | ORAL_CAPSULE | Freq: Two times a day (BID) | ORAL | Status: DC
  Administered 2016-05-01 – 2016-05-02 (×3): 100 mg via ORAL
  Filled 2016-05-01 (×3): qty 1

## 2016-05-01 MED ORDER — TECHNETIUM TC 99M TETROFOSMIN IV KIT
30.0000 | PACK | Freq: Once | INTRAVENOUS | Status: AC | PRN
  Administered 2016-05-01: 31.24 via INTRAVENOUS

## 2016-05-01 MED ORDER — TECHNETIUM TC 99M TETROFOSMIN IV KIT
12.7700 | PACK | Freq: Once | INTRAVENOUS | Status: AC | PRN
  Administered 2016-05-01: 12.77 via INTRAVENOUS

## 2016-05-01 NOTE — Progress Notes (Signed)
[place] at [place] NAME: Dana Sparks    MR#:  [id number]  DATE OF BIRTH:  02-28-1951  SUBJECTIVE:  CHIEF COMPLAINT:   Chief Complaint  Patient presents with  . Hyperglycemia   - Admitted with chest pain. Also complains of heartburn. -Sugars have been elevated. a1c pending - dry mouth as outpatient and also headache  REVIEW OF SYSTEMS:  Review of Systems  Constitutional: Positive for malaise/fatigue. Negative for chills and fever.  HENT: Negative for congestion, ear discharge, ear pain and nosebleeds.   Eyes: Negative for blurred vision and double vision.  Respiratory: Negative for cough, shortness of breath and wheezing.   Cardiovascular: Positive for chest pain. Negative for palpitations and leg swelling.  Gastrointestinal: Positive for heartburn. Negative for abdominal pain, constipation, diarrhea, nausea and vomiting.  Genitourinary: Negative for dysuria and urgency.  Musculoskeletal: Negative for myalgias and neck pain.  Neurological: Positive for headaches. Negative for dizziness, sensory change, speech change, focal weakness and seizures.  Psychiatric/Behavioral: Negative for depression.    DRUG ALLERGIES:   Allergies  Allergen Reactions  . Codeine Other (See Comments)    Unknown reaction  . Contrast Media [Iodinated Diagnostic Agents] Itching  . Sulfa Antibiotics Itching    VITALS:  Blood pressure 119/63, pulse 95, temperature 97.7 F (36.5 C), temperature source Oral, resp. rate 20, height 5\' 6"  (1.676 m), weight 99.4 kg (219 lb 3.2 oz), SpO2 95 %.  PHYSICAL EXAMINATION:  Physical Exam  GENERAL:  65 y.o.-year-old patient lying in the bed with no acute distress.  EYES: Pupils equal, round, reactive to light and accommodation. No scleral icterus. Extraocular muscles intact.  HEENT: Head atraumatic, normocephalic. Oropharynx and nasopharynx clear.  NECK:  Supple, no jugular venous distention. No thyroid enlargement,  no tenderness.  LUNGS: Normal breath sounds bilaterally, no wheezing, rales,rhonchi or crepitation. No use of accessory muscles of respiration.  CARDIOVASCULAR: S1, S2 normal. No murmurs, rubs, or gallops. Tenderness on palpation in the costochondral junction bilaterally. ABDOMEN: Soft, nontender, nondistended. Bowel sounds present. No organomegaly or mass.  EXTREMITIES: No pedal edema, cyanosis, or clubbing.  NEUROLOGIC: Cranial nerves II through XII are intact. Muscle strength 5/5 in all extremities. Sensation intact. Gait not checked.  PSYCHIATRIC: The patient is alert and oriented x 3.  SKIN: No obvious rash, lesion, or ulcer.    LABORATORY PANEL:   CBC  Recent Labs Lab 04/29/16 1641  WBC 6.5  HGB 12.9  HCT 39.2  PLT 270   ------------------------------------------------------------------------------------------------------------------  Chemistries   Recent Labs Lab 04/26/16 1657  05/01/16 0430  NA 136  < > 136  K 5.0  < > 3.6  CL 102  < > 104  CO2 20  < > 24  GLUCOSE 422*  < > 322*  BUN 16  < > 19  CREATININE 0.96  < > 0.66  CALCIUM 9.5  < > 9.1  AST 15  --   --   ALT 16  --   --   ALKPHOS 55  --   --   BILITOT 0.2  --   --   < > = values in this interval not displayed. ------------------------------------------------------------------------------------------------------------------  Cardiac Enzymes  Recent Labs Lab 04/30/16 0449  TROPONINI <0.03   ------------------------------------------------------------------------------------------------------------------  RADIOLOGY:  Dg Chest 2 View  Result Date: 04/29/2016 CLINICAL DATA:  History COPD.  Diabetes. EXAM: CHEST  2 VIEW COMPARISON:  None. FINDINGS: The heart size and mediastinal contours are within normal limits. Both  lungs are clear, apart from minimal atelectatic changes in the lung bases. The visualized skeletal structures are unremarkable. IMPRESSION: No active cardiopulmonary disease.  Electronically Signed   By: Fidela Salisbury M.D.   On: 04/29/2016 19:50    EKG:   Orders placed or performed during the hospital encounter of 04/29/16  . ED EKG  . ED EKG  . EKG 12-Lead  . EKG 12-Lead    ASSESSMENT AND PLAN:   65 year old female with past medical history significant for coronary artery disease status post stent in the remote past, diabetes mellitus, hypertension presents to hospital secondary to elevated blood sugars and also chest pain.  #1 chest pain-costochrondritis likely. . Troponins slightly elevated. -Appreciate cardiology consult.. -Myoview done today and pending -Continue cardiac medications. -Stress test last year negative  #2 uncontrolled diabetes mellitus-A1c of 8.3.  - started lantus today, discontinue glucotrol. Continue janumet -Sliding scale insulin while in the hospital.  #3 hypertension-continue home medications which include Norvasc, Imdur, Toprol and lisinopril.  #4 GERD-continue Protonix. Add Maalox as needed  #5 DVT prophylaxis-on Lovenox  Possible discharge tomorrow   All the records are reviewed and case discussed with Care Management/Social Workerr. Management plans discussed with the patient, family and they are in agreement.  CODE STATUS:Full code  TOTAL TIME TAKING CARE OF THIS PATIENT: 37 minutes.   POSSIBLE D/C TOMORROW, DEPENDING ON CLINICAL CONDITION.   Dana Sparks M.D on 05/01/2016 at 2:49 PM  Between 7am to 6pm - Pager - [phone]  After 6pm go to [email or web address] - password [email or web address] [facility]  Office  [phone]  CC: Primary care physician; Loistine Chance, MD

## 2016-05-01 NOTE — Progress Notes (Signed)
Inpatient Diabetes Program Recommendations  AACE/ADA: New Consensus Statement on Inpatient Glycemic Control (2015)  Target Ranges:  Prepandial:   less than 140 mg/dL      Peak postprandial:   less than 180 mg/dL (1-2 hours)      Critically ill patients:  140 - 180 mg/dL   Lab Results  Component Value Date   GLUCAP 326 (H) 05/01/2016   HGBA1C 8.3 (H) 04/30/2016    Review of Glycemic Control  Results for CHERRELLE, LUCENA (MRN [id number]) as of 05/01/2016 08:22  Ref. Range 04/30/2016 07:25 04/30/2016 11:12 04/30/2016 16:29 04/30/2016 21:15 05/01/2016 07:50  Glucose-Capillary Latest Ref Range: 65 - 99 mg/dL 302 (H) 281 (H) 381 (H) 134 (H) 326 (H)    Diabetes history: Type 2 Outpatient Diabetes medications: Janumet 50/1000mg  bid, Glucotrol 5mg  bid Current orders for Inpatient glycemic control: Glucotrol 5mg  bid, Metformin 1000mg  bid, Tradjenta 5mg /day, Novolog 0-9 units tid, Novolog 0-5 units qhs  Inpatient Diabetes Program Recommendations:  Consider d/c Glucotrol and begin Lantus 15 units qday (0.15units/kg).  (fasting blood sugars remain elevated)  Gentry Fitz, RN, [place], [place], CDE Diabetes Coordinator Inpatient Diabetes Program  [phone] (Team Pager) [phone] ([place]) 05/01/2016 8:26 AM

## 2016-05-01 NOTE — Consult Note (Signed)
   [facility] Inpatient Consult   05/01/2016  CHANNIE BOSTICK 1950-09-28 [id number]   Chart review revealed patient eligible for [place] Management services with a diagnosis of Diabetes type 2 for post hospital discharge follow up. Patient was evaluated for community based chronic disease management services with [facility] care Management Program as a benefit of patient's [facility] Medicare. Met with the patient and her daughter at the bedside to explain [place] Management services. Patient endorses her primary care provider to be Dr. Raliegh Ip. Sowles. Consent form signed. Patient gave [phone] as the best number to reach her and also listed her daughter Vanna [place] at [phone]([employer]FO[employer]) and [phone](work) and son Priscille Loveless [phone] as appropriate contacts in case she cannot be reached. Patient will receive post hospital discharge calls and be evaluated for monthly home visits. [facility] Care Management services does not interfere with or replace any services arranged by the inpatient care management team. RNCM left contact information and [facility] literature at the bedside.For additional questions please contact:   Jacqueline Delapena RN, [facility] Liaison  [phone]) Business Mobile [phone]) Toll free office

## 2016-05-01 NOTE — Care Management (Signed)
Presents from home with c/o chest pain and elevated blood sugars. Patient states she has a glucometer and has more recently been checking her sugars 2 times a day. PCP is Dr. Ancil Boozer and she was last seen last week. Uses [facility] mail order pharmacy and [facility] [address]. Is concerned with the cost of the Lantus. Requests we check her copay prior to discharge. Uses no DME. Drives and is normally independent. RNCM does  not anticipate any discharge needs.

## 2016-05-01 NOTE — Progress Notes (Signed)
Myoview showed no ischemia. Chest pain does not appear to be ischemic. No further cardiac workup indicated during this hospitalizaiton. Would ambulate and consider discharge from cardiac standpoint if stable. Follow up with Dr. Clayborn Bigness in 1 week.

## 2016-05-01 NOTE — Care Management Obs Status (Signed)
[place] NOTIFICATION   Patient Details  Name: Dana Sparks MRN: [id number] Date of Birth: 1951/01/09   Medicare Observation Status Notification Given:  Yes    Jolly Mango, RN 05/01/2016, 9:39 AM

## 2016-05-02 DIAGNOSIS — I10 Essential (primary) hypertension: Secondary | ICD-10-CM | POA: Diagnosis not present

## 2016-05-02 DIAGNOSIS — R748 Abnormal levels of other serum enzymes: Secondary | ICD-10-CM | POA: Diagnosis not present

## 2016-05-02 DIAGNOSIS — I2510 Atherosclerotic heart disease of native coronary artery without angina pectoris: Secondary | ICD-10-CM | POA: Diagnosis not present

## 2016-05-02 DIAGNOSIS — E119 Type 2 diabetes mellitus without complications: Secondary | ICD-10-CM | POA: Diagnosis not present

## 2016-05-02 LAB — BASIC METABOLIC PANEL
ANION GAP: 8 (ref 5–15)
BUN: 24 mg/dL — ABNORMAL HIGH (ref 6–20)
CALCIUM: 9.2 mg/dL (ref 8.9–10.3)
CO2: 25 mmol/L (ref 22–32)
Chloride: 104 mmol/L (ref 101–111)
Creatinine, Ser: 0.67 mg/dL (ref 0.44–1.00)
GFR calc Af Amer: >60 mL/min (ref >60)
GLUCOSE: 265 mg/dL — AB (ref 65–99)
POTASSIUM: 3.7 mmol/L (ref 3.5–5.1)
Sodium: 137 mmol/L (ref 135–145)

## 2016-05-02 LAB — GLUCOSE, CAPILLARY
Glucose-Capillary: 293 mg/dL — ABNORMAL HIGH (ref 65–99)
Glucose-Capillary: 281 mg/dL — ABNORMAL HIGH (ref 65–99)

## 2016-05-02 MED ORDER — INSULIN GLARGINE 100 UNIT/ML SOLOSTAR PEN: 28.0000 [IU] | PEN_INJECTOR | Freq: Every day | SUBCUTANEOUS | 11 refills | Status: DC

## 2016-05-02 MED ORDER — INSULIN GLARGINE 100 UNIT/ML ~~LOC~~ SOLN
28.0000 [IU] | Freq: Every day | SUBCUTANEOUS | Status: DC
  Administered 2016-05-02: 28 [IU] via SUBCUTANEOUS
  Filled 2016-05-02: qty 0.28

## 2016-05-02 MED ORDER — LISINOPRIL 40 MG PO TABS: 40.0000 mg | ORAL_TABLET | Freq: Every day | ORAL | 4 refills | Status: DC

## 2016-05-02 MED ORDER — ALUM & MAG HYDROXIDE-SIMETH 200-200-20 MG/5ML PO SUSP: 30.0000 mL | Freq: Four times a day (QID) | ORAL | 0 refills | Status: DC | PRN

## 2016-05-02 MED ORDER — PEN NEEDLES 31G X 6 MM MISC: 2 refills | Status: DC

## 2016-05-02 MED ORDER — TRAMADOL HCL 50 MG PO TABS: 50.0000 mg | ORAL_TABLET | Freq: Four times a day (QID) | ORAL | 0 refills | Status: DC | PRN

## 2016-05-02 MED ORDER — BLOOD GLUCOSE MONITOR KIT: PACK | 0 refills | Status: AC

## 2016-05-02 MED ORDER — FERROUS SULFATE 325 (65 FE) MG PO TABS: 325.0000 mg | ORAL_TABLET | Freq: Two times a day (BID) | ORAL | 2 refills | Status: DC

## 2016-05-02 NOTE — Progress Notes (Signed)
Inpatient Diabetes Program Recommendations  AACE/ADA: New Consensus Statement on Inpatient Glycemic Control (2015)  Target Ranges:  Prepandial:   less than 140 mg/dL      Peak postprandial:   less than 180 mg/dL (1-2 hours)      Critically ill patients:  140 - 180 mg/dL    Patient to be d/c'd home today on Lantus insulin pen.  Spoke with patient about using Lantus insulin at home.  Discussed with patient that Lantus to be given once daily at the same time everyday.  Patient told me she has used insulin pens in the past.  Reviewed insulin pen use at home with patient.  Patient able to provide successful return demonstration of insulin pen.  Patient had questions regarding where she would get her Rxs from at time of d/c.  Asked RN caring for pt to address these questions with patient.    --Will follow patient during hospitalization--  Wyn Quaker RN, MSN, CDE Diabetes Coordinator Inpatient Glycemic Control Team Team Pager: [phone] (8a-5p)

## 2016-05-02 NOTE — Progress Notes (Signed)
Patient is being discharge in a stable condition , summary and f/u care given to both pt and daughter, verbalized understanding , left with daughter

## 2016-05-02 NOTE — Evaluation (Signed)
Physical Therapy Evaluation Patient Details Name: Dana Sparks MRN: [id number] DOB: 07-17-1951 Today's Date: 05/02/2016   History of Present Illness  Pt is a 65 y.o. female presenting to hospital with hyperglycemia and chest pain.  Pt admitted with elevated troponin with frequent episodes of chest pain.  Lexiscan demonstrated no ischemia.  PMH includes htn, DM, CAD s/p stents, anxiety, COPD, mild mitral and tricuspid insufficiency, R TKA.  Clinical Impression  Pt demonstrates steady safe independent functional mobility ambulating without AD.  Pt scored 28/28 on Tinetti balance assessment indicating pt is at low risk for falls.  Pt appears safe to discharge home with support from family.  No further acute PT needs identified in hospital or upon discharge from hospital.  Will complete current PT order.  Please re-consult PT if pt's status changes and acute PT needs are identified.    Follow Up Recommendations No PT follow up    Equipment Recommendations  None recommended by PT    Recommendations for Other Services       Precautions / Restrictions Precautions Precautions: Fall Restrictions Weight Bearing Restrictions: No      Mobility  Bed Mobility Overal bed mobility: Independent                Transfers Overall transfer level: Independent Equipment used: None             General transfer comment: steady strong stand without loss of balance  Ambulation/Gait Ambulation/Gait assistance: Independent Ambulation Distance (Feet): 200 Feet Assistive device: None Gait Pattern/deviations: WFL(Within Functional Limits);Step-through pattern   Gait velocity interpretation: >2.62 ft/sec, indicative of independent community ambulator General Gait Details: steady without loss of balance  Stairs Stairs:  (pt declined to perform stairs and stated she doesn't have any problems navigating stairs at home)          Wheelchair Mobility    Modified Rankin (Stroke Patients  Only)       Balance Overall balance assessment: Independent                               Standardized Balance Assessment Standardized Balance Assessment :  (Tinetti balance assessment: 28/28 score)           Pertinent Vitals/Pain Pain Assessment: No/denies pain  See flow-sheet for HR and O2 vitals.    Home Living Family/patient expects to be discharged to:: Private residence Living Arrangements: Spouse/significant other Available Help at Discharge: Family Type of Home: House Home Access: Stairs to enter Entrance Stairs-Rails: Right Entrance Stairs-Number of Steps: 2 Home Layout: One level Home Equipment: None      Prior Function Level of Independence: Independent         Comments: Pt denies any falls in past 6 months.     Hand Dominance        Extremity/Trunk Assessment   Upper Extremity Assessment: Overall WFL for tasks assessed           Lower Extremity Assessment: Overall WFL for tasks assessed         Communication   Communication: No difficulties  Cognition Arousal/Alertness: Awake/alert Behavior During Therapy: WFL for tasks assessed/performed Overall Cognitive Status: Within Functional Limits for tasks assessed                      General Comments General comments (skin integrity, edema, etc.): Pt laying in bed watching t.v. upon PT entering room. Pt agreeable to PT session.  Exercises        Assessment/Plan    PT Assessment Patent does not need any further PT services  PT Diagnosis     PT Problem List    PT Treatment Interventions     PT Goals (Current goals can be found in the Care Plan section) Acute Rehab PT Goals Patient Stated Goal: to go home PT Goal Formulation: With patient Time For Goal Achievement: 05/16/16 Potential to Achieve Goals: Good    Frequency     Barriers to discharge        Co-evaluation               End of Session Equipment Utilized During Treatment: Gait  belt Activity Tolerance: Patient tolerated treatment well Patient left: in bed;with call bell/phone within reach Nurse Communication: Mobility status    Functional Assessment Tool Used: AM-PAC without stairs Functional Limitation: Mobility: Walking and moving around Mobility: Walking and Moving Around Current Status [phone]): 0 percent impaired, limited or restricted Mobility: Walking and Moving Around Goal Status [phone]): 0 percent impaired, limited or restricted Mobility: Walking and Moving Around Discharge Status [phone]): 0 percent impaired, limited or restricted    Time: 1150-1205 PT Time Calculation (min) (ACUTE ONLY): 15 min   Charges:   PT Evaluation $PT Eval Low Complexity: 1 Procedure     PT G Codes:   PT G-Codes **NOT FOR INPATIENT CLASS** Functional Assessment Tool Used: AM-PAC without stairs Functional Limitation: Mobility: Walking and moving around Mobility: Walking and Moving Around Current Status [phone]): 0 percent impaired, limited or restricted Mobility: Walking and Moving Around Goal Status [phone]): 0 percent impaired, limited or restricted Mobility: Walking and Moving Around Discharge Status [id number]): 0 percent impaired, limited or restricted    [facility] 05/02/2016, 12:16 PM Leitha Bleak, [place]

## 2016-05-02 NOTE — Discharge Summary (Signed)
[facility] [facility] [place] NAME: Dana Sparks    MR#:  [id number]  DATE OF BIRTH:  [birth year]  DATE OF ADMISSION:  04/29/2016   ADMITTING PHYSICIAN: Nicholes Mango, MD  DATE OF DISCHARGE: 05/02/2016  PRIMARY CARE PHYSICIAN: Loistine Chance, MD   ADMISSION DIAGNOSIS:   Shortness of breath [R06.02] Hyperglycemia [R73.9] Chest pain, unspecified chest pain type [R07.9]  DISCHARGE DIAGNOSIS:   Active Problems:   Chest pain   SECONDARY DIAGNOSIS:   Past Medical History:  Diagnosis Date  . Allergy   . Anxiety   . Arthritis   . CAD (coronary artery disease)   . COPD (chronic obstructive pulmonary disease) ([place])   . Depression   . Diabetes mellitus without complication ([place])   . Diverticulitis   . Gastroparesis   . Gout   . Hyperlipemia   . Hypertension   . Positive H. pylori test   . Renal insufficiency   . Sleep apnea   . Thrombocytosis ([place]) 01/28/2015  . Tubular adenoma of colon 01/20/14    HOSPITAL COURSE:   65 year old female with past medical history significant for coronary artery disease status post stent in the remote past, diabetes mellitus, hypertension presents to hospital secondary to elevated blood sugars and also chest pain.  #1 chest pain-costochrondritis. . Troponins slightly elevated but stable and plataeued. -Appreciate cardiology consult.. -Myoview done and negative for any ischemia, so outpatient cardiology f/u recommended -Continue cardiac medications. -Stress test last year negative - tramadol prn for pain  #2 uncontrolled diabetes mellitus-A1c of 8.3.  - started lantus in the hospital and being discharged on lantus pen- PCP f/u in 1 week -, discontinue glucotrol. Continue janumet - glucometer,. Home glucose checks, home health RN at discharge  #3 hypertension-continue home medications which include Norvasc, Imdur, Toprol and lisinopril. Also on low dos torsemide  #4 GERD-continue Protonix. Added  Maalox as needed  Medically stable for discharge. Worked with physical therapy and no needs identified.  DISCHARGE CONDITIONS:   Stable  CONSULTS OBTAINED:   Treatment Team:  Teodoro Spray, MD  DRUG ALLERGIES:   Allergies  Allergen Reactions  . Codeine Other (See Comments)    Unknown reaction  . Contrast Media [Iodinated Diagnostic Agents] Itching  . Sulfa Antibiotics Itching   DISCHARGE MEDICATIONS:     Medication List    STOP taking these medications   conjugated estrogens vaginal cream Commonly known as:  PREMARIN   glipiZIDE 5 MG tablet Commonly known as:  GLUCOTROL   valACYclovir 500 MG tablet Commonly known as:  VALTREX     TAKE these medications   allopurinol 100 MG tablet Commonly known as:  ZYLOPRIM Take 1 tablet (100 mg total) by mouth daily. What changed:  when to take this   alum & mag hydroxide-simeth 200-200-20 MG/5ML suspension Commonly known as:  MAALOX/MYLANTA Take 30 mLs by mouth every 6 (six) hours as needed for indigestion or heartburn.   amLODipine 5 MG tablet Commonly known as:  NORVASC TAKE 1 TABLET EVERY DAY What changed:  See the new instructions.   aspirin EC 81 MG tablet Take 81 mg by mouth every morning.   benzonatate 100 MG capsule Commonly known as:  TESSALON Take 100 mg by mouth every 4 (four) hours as needed for cough.   blood glucose meter kit and supplies Kit Dispense based on patient and insurance preference. Use up to four times daily as directed. (FOR ICD-9 250.00, 250.01).   citalopram 20  MG tablet Commonly known as:  CELEXA Take 1 tablet (20 mg total) by mouth daily. What changed:  when to take this   CoQ10 100 MG Caps Take 1 capsule by mouth daily.   estradiol 0.5 MG tablet Commonly known as:  ESTRACE Take 1 tablet (0.5 mg total) by mouth daily. What changed:  when to take this  reasons to take this  additional instructions   ferrous sulfate 325 (65 FE) MG tablet Take 1 tablet (325 mg total) by  mouth 2 (two) times daily with a meal.   fexofenadine 180 MG tablet Commonly known as:  ALLEGRA Take 180 mg by mouth daily as needed for allergies.   fluticasone 50 MCG/ACT nasal spray Commonly known as:  FLONASE Place 2 sprays into both nostrils daily. What changed:  when to take this  reasons to take this   Insulin Glargine 100 UNIT/ML Solostar Pen Commonly known as:  LANTUS SOLOSTAR Inject 28 Units into the skin daily.   isosorbide mononitrate 60 MG 24 hr tablet Commonly known as:  IMDUR TAKE 1 TABLET EVERY DAY What changed:  See the new instructions.   lisinopril 40 MG tablet Commonly known as:  PRINIVIL,ZESTRIL Take 1 tablet (40 mg total) by mouth daily. What changed:  See the new instructions.   loratadine 10 MG tablet Commonly known as:  CLARITIN Take 10 mg by mouth daily as needed for allergies or rhinitis.   metoprolol succinate 50 MG 24 hr tablet Commonly known as:  TOPROL-XL Take 1 tablet (50 mg total) by mouth daily. What changed:  when to take this   pantoprazole 40 MG tablet Commonly known as:  PROTONIX TAKE 1 TABLET EVERY MORNING   Pen Needles 31G X 6 MM Misc Use for insulin pen   potassium chloride 10 MEQ tablet Commonly known as:  K-DUR,KLOR-CON Take 1 tablet (10 mEq total) by mouth daily as needed.   rosuvastatin 20 MG tablet Commonly known as:  CRESTOR TAKE 1 TABLET EVERY DAY What changed:  See the new instructions.   sitaGLIPtin-metformin 50-1000 MG tablet Commonly known as:  JANUMET Take 1 tablet by mouth 2 (two) times daily.   torsemide 20 MG tablet Commonly known as:  DEMADEX Take 1 tablet (20 mg total) by mouth daily. What changed:  when to take this  reasons to take this   traMADol 50 MG tablet Commonly known as:  ULTRAM Take 1 tablet (50 mg total) by mouth every 6 (six) hours as needed for moderate pain.        DISCHARGE INSTRUCTIONS:   1. Cardiology f/u in 1-2 weeks 2. PCP f/u for diabetes and Hypertension  management in 1 week  DIET:   Cardiac diet  ACTIVITY:   Activity as tolerated  OXYGEN:   Home Oxygen: No.  Oxygen Delivery: room air  DISCHARGE LOCATION:   home   If you experience worsening of your admission symptoms, develop shortness of breath, life threatening emergency, suicidal or homicidal thoughts you must seek medical attention immediately by calling 911 or calling your MD immediately  if symptoms less severe.  You Must read complete instructions/literature along with all the possible adverse reactions/side effects for all the Medicines you take and that have been prescribed to you. Take any new Medicines after you have completely understood and accpet all the possible adverse reactions/side effects.   Please note  You were cared for by a hospitalist during your hospital stay. If you have any questions about your discharge medications or the  care you received while you were in the hospital after you are discharged, you can call the unit and asked to speak with the hospitalist on call if the hospitalist that took care of you is not available. Once you are discharged, your primary care physician will handle any further medical issues. Please note that NO REFILLS for any discharge medications will be authorized once you are discharged, as it is imperative that you return to your primary care physician (or establish a relationship with a primary care physician if you do not have one) for your aftercare needs so that they can reassess your need for medications and monitor your lab values.    On the day of Discharge:  VITAL SIGNS:   Blood pressure 103/62, pulse (!) 114, temperature 97.7 F (36.5 C), temperature source Oral, resp. rate 18, height '5\' 6"'  (1.676 m), weight 99.4 kg (219 lb 3.2 oz), SpO2 96 %.  PHYSICAL EXAMINATION:    GENERAL:  65 y.o.-year-old patient lying in the bed with no acute distress.  EYES: Pupils equal, round, reactive to light and accommodation. No  scleral icterus. Extraocular muscles intact.  HEENT: Head atraumatic, normocephalic. Oropharynx and nasopharynx clear.  NECK:  Supple, no jugular venous distention. No thyroid enlargement, no tenderness.  LUNGS: Normal breath sounds bilaterally, no wheezing, rales,rhonchi or crepitation. No use of accessory muscles of respiration.  CARDIOVASCULAR: S1, S2 normal. No murmurs, rubs, or gallops. Tenderness on palpation in the costochondral junction bilaterally- improving. ABDOMEN: Soft, nontender, nondistended. Bowel sounds present. No organomegaly or mass.  EXTREMITIES: No pedal edema, cyanosis, or clubbing.  NEUROLOGIC: Cranial nerves II through XII are intact. Muscle strength 5/5 in all extremities. Sensation intact. Gait not checked.  PSYCHIATRIC: The patient is alert and oriented x 3.  SKIN: No obvious rash, lesion, or ulcer.    DATA REVIEW:   CBC  Recent Labs Lab 04/29/16 1641  WBC 6.5  HGB 12.9  HCT 39.2  PLT 270    Chemistries   Recent Labs Lab 04/26/16 1657  05/02/16 0346  NA 136  < > 137  K 5.0  < > 3.7  CL 102  < > 104  CO2 20  < > 25  GLUCOSE 422*  < > 265*  BUN 16  < > 24*  CREATININE 0.96  < > 0.67  CALCIUM 9.5  < > 9.2  AST 15  --   --   ALT 16  --   --   ALKPHOS 55  --   --   BILITOT 0.2  --   --   < > = values in this interval not displayed.   Microbiology Results  Results for orders placed or performed in visit on 02/02/16  Urine culture     Status: None   Collection Time: 02/02/16 12:00 AM  Result Value Ref Range Status   Urine Culture, Routine Final report  Final   Urine Culture result 1 Comment  Final    Comment: Mixed urogenital flora Less than 10,000 colonies/mL   Herpes simplex virus culture     Status: None   Collection Time: 02/02/16 12:00 AM  Result Value Ref Range Status   HSV Culture/Type Comment  Final    Comment: Negative No Herpes simplex virus isolated.     RADIOLOGY:  No results found.   Management plans discussed  with the patient, family and they are in agreement.  CODE STATUS:     Code Status Orders  Start     Ordered   04/29/16 2305  Full code  Continuous     04/29/16 2304    Code Status History    Date Active Date Inactive Code Status Order ID Comments User Context   04/29/2016 11:04 PM 04/30/2016  7:39 AM Full Code [id number]  Nicholes Mango, MD ED    Advance Directive Documentation   Flowsheet Row Most Recent Value  Type of Advance Directive  Healthcare Power of Attorney  Pre-existing out of facility DNR order (yellow form or pink MOST form)  No data  "MOST" Form in Place?  No data      TOTAL TIME TAKING CARE OF THIS PATIENT: 38 minutes.    Elijio Staples M.D on 05/02/2016 at 3:42 PM  Between 7am to 6pm - Pager - [phone]  After 6pm go to [email or web address] - password [email or web address] [facility]  [facility]  Office  [phone]  CC: Primary care physician; Loistine Chance, MD   Note: This dictation was prepared with Dragon dictation along with smaller phrase technology. Any transcriptional errors that result from this process are unintentional.

## 2016-05-02 NOTE — Care Management (Signed)
Patient is discharging home today and found she is discharging on new insulin.  Spoke with primary nurse regarding instruction on injections.  Patients says she has used insulin before but only in a pen.  She does not know is she would be able to prepare insulin from a vial.  Asks about costs of the medication.  CM will contact insurance company to inquire about copays.  Patient may benefit from short term home health nurse follow up for skilled observation/monitoring of glycemic status.  She is in agreement.  Agency preference is Nurse, learning disability.  Referral called and accepted.  Obtaining order from attending.  Per [facility] pharmacy benefits- patient's copay for the insulin will be 47 dollars for one month supply.. Informed patient and informed primary nurse to make sure patient has insulin script to present to her local pharmacy today.  it appears script was efiled to her mail order pharmacy

## 2016-05-03 ENCOUNTER — Other Ambulatory Visit: Payer: Self-pay | Admitting: *Deleted

## 2016-05-03 DIAGNOSIS — I2510 Atherosclerotic heart disease of native coronary artery without angina pectoris: Secondary | ICD-10-CM | POA: Diagnosis not present

## 2016-05-03 DIAGNOSIS — I10 Essential (primary) hypertension: Secondary | ICD-10-CM | POA: Diagnosis not present

## 2016-05-03 DIAGNOSIS — N183 Chronic kidney disease, stage 3 (moderate): Secondary | ICD-10-CM | POA: Diagnosis not present

## 2016-05-03 DIAGNOSIS — E1122 Type 2 diabetes mellitus with diabetic chronic kidney disease: Secondary | ICD-10-CM | POA: Diagnosis not present

## 2016-05-03 DIAGNOSIS — I340 Nonrheumatic mitral (valve) insufficiency: Secondary | ICD-10-CM | POA: Diagnosis not present

## 2016-05-03 NOTE — Patient Outreach (Signed)
4:26 pm -Transition of care call successful.  Spoke with pt, HIPAA verified,discussed purpose of call to follow up on recent hospitalization (8/26-8/29 Shortness of breath, Hyperglycemia, chest pain).   Pt reports [facility] RN came out today to help with her insulin pen, took her insulin but realized she forgot to take her every day medications, took them 2 hours ago.   Pt reports had chest pain but since she took her Isosorbide, feel better. Review of pt's discharge medications, pt reports did not get her Maalox yet - mail order through [facility], still need to get  coq10, currently not taking Estrace as Dr. Ancil Boozer told her to wean herself off.  Pt reports her sugar today was 282, not sure how many times a day to check her sugar, prior to hospitalization was checking it  twice a day.  Pt reports she has not been using her CPAP to which [facility] RN suggested she try it again to which pt agreed.  RN CM discussed with pt [facility] transition of care program- follow up for 31 days (weekly calls, home visit).    Patient was recently discharged from hospital and all medications have been reviewed.  Plan:  To inform Dr. Ancil Boozer by in basket about [facility] involvement.              As discussed with pt, plan to do a home visit next week.    Dana Sparks.   [facility]  [phone]

## 2016-05-03 NOTE — Patient Outreach (Addendum)
Transition of care call made.   Person answering the phone states is pt,(HIPAA- all requirements not verified) reports with [facility] nurse right now, requests  a call back.      Plan:  RN CM to call pt back later today.     Zara Chess.   [facility]  [phone]

## 2016-05-04 ENCOUNTER — Telehealth: Payer: Self-pay

## 2016-05-04 NOTE — Telephone Encounter (Signed)
Dana Sparks was recently admitted to the ER due to Chest Pain and SOB, Dana Sparks medications have changed. Also the physicians at the ER instructed Dana Sparks to stop Estrace due to starting Lantus.  Dana Sparks was recently Took her off the Glipizide and put her on Lantus 28 units daily, put her on Janumet 50-1000 mg 1 bid. Allegra  Allegra stopped and taking otcChlorpheniramine Maleate 4 mg  Needs Coq-10 send in due to myalgia.  Nurse is needing verbal before he can do diabetes management and provide Dana Sparks learning on how to control her sugar before he proceeds.  [name], [name] is trying to get authorization for Diabetes care at home 2 a week this week and then one a week for 3 weeks total. Is this ok?

## 2016-05-04 NOTE — Telephone Encounter (Signed)
Estrace stopped because of cardiovascular risk associated with its use

## 2016-05-04 NOTE — Telephone Encounter (Signed)
Please up date her medication list Okay to have diabetes home care Thank you

## 2016-05-04 NOTE — Telephone Encounter (Signed)
Evelena Peat, RN was also asking about the Estrace should she stop this while on the Lantus? Patient instructed nurse she was told not to take it together? Thanks

## 2016-05-05 DIAGNOSIS — I10 Essential (primary) hypertension: Secondary | ICD-10-CM | POA: Diagnosis not present

## 2016-05-05 DIAGNOSIS — I2510 Atherosclerotic heart disease of native coronary artery without angina pectoris: Secondary | ICD-10-CM | POA: Diagnosis not present

## 2016-05-05 DIAGNOSIS — I340 Nonrheumatic mitral (valve) insufficiency: Secondary | ICD-10-CM | POA: Diagnosis not present

## 2016-05-05 DIAGNOSIS — E1122 Type 2 diabetes mellitus with diabetic chronic kidney disease: Secondary | ICD-10-CM | POA: Diagnosis not present

## 2016-05-05 DIAGNOSIS — N183 Chronic kidney disease, stage 3 (moderate): Secondary | ICD-10-CM | POA: Diagnosis not present

## 2016-05-05 NOTE — Telephone Encounter (Signed)
Evelena Peat, RN notified to inform patient to stay off of the Estrace.

## 2016-05-09 ENCOUNTER — Ambulatory Visit: Payer: Commercial Managed Care - HMO

## 2016-05-10 ENCOUNTER — Encounter: Payer: Self-pay | Admitting: Family Medicine

## 2016-05-10 ENCOUNTER — Ambulatory Visit (INDEPENDENT_AMBULATORY_CARE_PROVIDER_SITE_OTHER): Payer: Commercial Managed Care - HMO | Admitting: Family Medicine

## 2016-05-10 VITALS — BP 120/58 | HR 95 | Temp 97.6°F | Resp 16 | Ht 66.0 in | Wt 211.9 lb

## 2016-05-10 DIAGNOSIS — I209 Angina pectoris, unspecified: Secondary | ICD-10-CM

## 2016-05-10 DIAGNOSIS — R21 Rash and other nonspecific skin eruption: Secondary | ICD-10-CM

## 2016-05-10 DIAGNOSIS — E1122 Type 2 diabetes mellitus with diabetic chronic kidney disease: Secondary | ICD-10-CM | POA: Diagnosis not present

## 2016-05-10 DIAGNOSIS — Z09 Encounter for follow-up examination after completed treatment for conditions other than malignant neoplasm: Secondary | ICD-10-CM | POA: Diagnosis not present

## 2016-05-10 DIAGNOSIS — E0822 Diabetes mellitus due to underlying condition with diabetic chronic kidney disease: Secondary | ICD-10-CM

## 2016-05-10 DIAGNOSIS — Z794 Long term (current) use of insulin: Secondary | ICD-10-CM

## 2016-05-10 DIAGNOSIS — E1143 Type 2 diabetes mellitus with diabetic autonomic (poly)neuropathy: Secondary | ICD-10-CM

## 2016-05-10 DIAGNOSIS — N183 Chronic kidney disease, stage 3 unspecified: Secondary | ICD-10-CM

## 2016-05-10 DIAGNOSIS — E0865 Diabetes mellitus due to underlying condition with hyperglycemia: Secondary | ICD-10-CM | POA: Diagnosis not present

## 2016-05-10 DIAGNOSIS — IMO0002 Reserved for concepts with insufficient information to code with codable children: Secondary | ICD-10-CM

## 2016-05-10 MED ORDER — INSULIN GLARGINE 100 UNIT/ML SOLOSTAR PEN: 28.0000 [IU] | PEN_INJECTOR | Freq: Every day | SUBCUTANEOUS | 2 refills | Status: DC

## 2016-05-10 MED ORDER — TRIAMCINOLONE ACETONIDE 0.1 % EX CREA: 1.0000 "application " | TOPICAL_CREAM | Freq: Two times a day (BID) | CUTANEOUS | 0 refills | Status: DC

## 2016-05-10 MED ORDER — SITAGLIPTIN PHOS-METFORMIN HCL 50-1000 MG PO TABS: 1.0000 | ORAL_TABLET | Freq: Two times a day (BID) | ORAL | 1 refills | Status: DC

## 2016-05-10 MED ORDER — TRIAMCINOLONE ACETONIDE 0.1 % EX CREA
1.0000 | TOPICAL_CREAM | Freq: Two times a day (BID) | CUTANEOUS | 0 refills | Status: DC
Start: 2016-05-10 — End: 2016-05-10

## 2016-05-10 NOTE — Progress Notes (Signed)
Name: Dana Sparks   MRN: [id number]    DOB: 08-Jul-1951   Date:05/10/2016       Progress Note  Subjective  Chief Complaint  Chief Complaint  Patient presents with  . Hospitalization Follow-up    Patient went into ER due to chest pain and blood sugar was 455. While in the Hospital they performed Stress Test and put patient on Lantus Insulin due to elevated BS.   . Diabetes    Patient has a Diabetic Counselor that comes out and helps her. Patient is getting blood sugar checks twice a day, Low-92 Average-low 200's High 405    HPI  Hospital follow up: she went to [facility] 08/26 and discharge home on 05/02/2016. She went in with chest pain and uncontrolled blood sugars. She had bumped troponin but negative myoview. She was diagnosed with costochondritis, glucose has improved since started on Lantus and off Glucotrol. This morning glucose was 129. She is having diabetic teaching classes at home. She is still not sure of what to eat. She still has mild soreness on anterior chest but only with pressure. She denies SOB, or diaphoresis. Appetite is good.   Patient Active Problem List   Diagnosis Date Noted  . Chest pain 04/29/2016  . Elevated antinuclear antibody (ANA) level 04/27/2016  . Angina pectoris ([place]) 11/01/2015  . Well controlled type 2 diabetes mellitus with gastroparesis ([place]) 06/22/2015  . Low TSH level 06/22/2015  . Iron deficiency anemia 04/07/2015  . Intestinal metaplasia of gastric mucosa 03/11/2015  . CAD (coronary artery disease), native coronary artery 02/24/2015  . History of coronary artery stent placement 02/24/2015  . Chronic kidney disease, stage 3, mod decreased GFR 02/24/2015  . Menopause 02/24/2015  . History of Helicobacter pylori infection 02/24/2015  . Mild mitral insufficiency 02/24/2015  . Mild tricuspid insufficiency 02/24/2015  . 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 Controlled gout 02/24/2015  . Depression, major,  recurrent, mild ([place]) 02/24/2015  . Morbid obesity due to excess calories ([place]) 02/24/2015  . Mild pulmonary hypertension ([place]) 02/24/2015  . Gastroesophageal reflux disease without esophagitis 02/24/2015  . Perennial allergic rhinitis 02/24/2015  . HLD (hyperlipidemia) 02/20/2015  . Hypertension, benign 02/20/2015  . Apnea, sleep 02/20/2015  . Controlled diabetes mellitus with stage 3 chronic kidney disease, without long-term current use of insulin ([place]) 02/20/2015  . Thrombocytosis ([place]) 01/28/2015    Past Surgical History:  Procedure Laterality Date  . ABDOMINAL HYSTERECTOMY     total  . CATARACT EXTRACTION    . COLONOSCOPY  01/2014   sigmoid diverticulosis. desc colon TA, hyperplastic polyp  . CORONARY ANGIOPLASTY WITH STENT PLACEMENT    . ESOPHAGOGASTRODUODENOSCOPY N/A 02/16/2015   negative h pylori, focal gastic intestinal metaplasia  . TOTAL KNEE ARTHROPLASTY Right     Family History  Problem Relation Age of Onset  . Cancer Sister     unsure  . Alcohol abuse Brother   . Colon cancer Neg Hx   . Liver disease Neg Hx   . Breast cancer Neg Hx     Social History   Social History  . Marital status: Married    Spouse name: N/A  . Number of children: N/A  . Years of education: N/A   Occupational History  . Not on file.   Social History Main Topics  . Smoking status: Former Smoker    Packs/day: 1.00    Years: 40.00    Types: Cigarettes    Quit date:  12/30/2012  . Smokeless tobacco: Never Used  . Alcohol use No  . Drug use: No  . Sexual activity: Not Currently   Other Topics Concern  . Not on file   Social History Narrative  . No narrative on file     Current Outpatient Prescriptions:  .  allopurinol (ZYLOPRIM) 100 MG tablet, Take 1 tablet (100 mg total) by mouth daily. (Patient taking differently: Take 100 mg by mouth every morning. ), Disp: 90 tablet, Rfl: 3 .  alum & mag hydroxide-simeth (MAALOX/MYLANTA) 200-200-20 MG/5ML suspension, Take 30 mLs by  mouth every 6 (six) hours as needed for indigestion or heartburn., Disp: 355 mL, Rfl: 0 .  amLODipine (NORVASC) 5 MG tablet, TAKE 1 TABLET EVERY DAY (Patient taking differently: TAKE 1 TABLET EVERY MORNING.), Disp: 90 tablet, Rfl: 1 .  aspirin EC 81 MG tablet, Take 81 mg by mouth every morning. , Disp: , Rfl:  .  benzonatate (TESSALON) 100 MG capsule, Take 100 mg by mouth every 4 (four) hours as needed for cough., Disp: , Rfl:  .  blood glucose meter kit and supplies KIT, Dispense based on patient and insurance preference. Use up to four times daily as directed. (FOR ICD-9 250.00, 250.01)., Disp: 1 each, Rfl: 0 .  citalopram (CELEXA) 20 MG tablet, Take 1 tablet (20 mg total) by mouth daily. (Patient taking differently: Take 20 mg by mouth every morning. ), Disp: 90 tablet, Rfl: 3 .  Coenzyme Q10 (COQ10) 100 MG CAPS, Take 1 capsule by mouth daily., Disp: 90 each, Rfl: 1 .  estradiol (ESTRACE) 0.5 MG tablet, Take 1 tablet (0.5 mg total) by mouth daily., Disp: 90 tablet, Rfl: 3 .  Ferrous Sulfate (IRON) 325 (65 Fe) MG TABS, Take by mouth. Nature made brand-  Pt takes one tablet twice a day, Disp: , Rfl:  .  ferrous sulfate 325 (65 FE) MG tablet, Take 1 tablet (325 mg total) by mouth 2 (two) times daily with a meal., Disp: 60 tablet, Rfl: 2 .  fexofenadine (ALLEGRA) 180 MG tablet, Take 180 mg by mouth daily as needed for allergies. , Disp: , Rfl:  .  fluticasone (FLONASE) 50 MCG/ACT nasal spray, Place 2 sprays into both nostrils daily. (Patient taking differently: Place 2 sprays into both nostrils daily as needed for allergies. ), Disp: 48 g, Rfl: 1 .  Insulin Glargine (LANTUS SOLOSTAR) 100 UNIT/ML Solostar Pen, Inject 28 Units into the skin daily., Disp: 15 mL, Rfl: 11 .  Insulin Pen Needle (PEN NEEDLES) 31G X 6 MM MISC, Use for insulin pen, Disp: 100 each, Rfl: 2 .  isosorbide mononitrate (IMDUR) 60 MG 24 hr tablet, TAKE 1 TABLET EVERY DAY (Patient taking differently: TAKE 1 TABLET EVERY DAY IN THE  MORNING.), Disp: 90 tablet, Rfl: 1 .  lisinopril (PRINIVIL,ZESTRIL) 40 MG tablet, Take 1 tablet (40 mg total) by mouth daily., Disp: 30 tablet, Rfl: 4 .  loratadine (CLARITIN) 10 MG tablet, Take 10 mg by mouth daily as needed for allergies or rhinitis. , Disp: , Rfl:  .  metoprolol succinate (TOPROL-XL) 50 MG 24 hr tablet, Take 1 tablet (50 mg total) by mouth daily. (Patient taking differently: Take 50 mg by mouth every morning. ), Disp: 90 tablet, Rfl: 3 .  pantoprazole (PROTONIX) 40 MG tablet, TAKE 1 TABLET EVERY MORNING, Disp: 90 tablet, Rfl: 1 .  potassium chloride (K-DUR,KLOR-CON) 10 MEQ tablet, Take 1 tablet (10 mEq total) by mouth daily as needed., Disp: 90 tablet, Rfl: 3 .  rosuvastatin (CRESTOR) 20 MG tablet, TAKE 1 TABLET EVERY DAY (Patient taking differently: TAKE 1 TABLET EVERY MORNING.), Disp: 90 tablet, Rfl: 3 .  sitaGLIPtin-metformin (JANUMET) 50-1000 MG tablet, Take 1 tablet by mouth 2 (two) times daily., Disp: 120 tablet, Rfl: 1 .  torsemide (DEMADEX) 20 MG tablet, Take 1 tablet (20 mg total) by mouth daily., Disp: 90 tablet, Rfl: 3 .  traMADol (ULTRAM) 50 MG tablet, Take 1 tablet (50 mg total) by mouth every 6 (six) hours as needed for moderate pain., Disp: 20 tablet, Rfl: 0 .  triamcinolone cream (KENALOG) 0.1 %, Apply 1 application topically 2 (two) times daily., Disp: 30 g, Rfl: 0  Allergies  Allergen Reactions  . Codeine Other (See Comments)    Unknown reaction  . Contrast Media [Iodinated Diagnostic Agents] Itching  . Sulfa Antibiotics Itching     ROS  Constitutional: Negative for fever, positive for  weight change - changing her diet  Respiratory: Negative for cough and shortness of breath.   Cardiovascular: Negative for chest pain or palpitations.  Gastrointestinal: Negative for abdominal pain, no bowel changes.  Musculoskeletal: Negative for gait problem or joint swelling.  Skin: positive for rash.  both upper arms since hospital stay Neurological: Negative for  dizziness or headache.  No other specific complaints in a complete review of systems (except as listed in HPI above).  Objective  Vitals:   05/10/16 1540  BP: (!) 120/58  Pulse: 95  Resp: 16  Temp: 97.6 F (36.4 C)  TempSrc: Oral  SpO2: 97%  Weight: 211 lb 14.4 oz (96.1 kg)  Height: _0  (1.676 m)    Body mass index is 34.2 kg/m.  Physical Exam  Constitutional: Patient appears well-developed and well-nourished. Obese No distress.  HEENT: head atraumatic, normocephalic, pupils equal and reactive to light, neck supple, throat within normal limits Cardiovascular: Normal rate, regular rhythm and normal heart sounds.  No murmur heard. No BLE edema. Pulmonary/Chest: Effort normal and breath sounds normal. No respiratory distress. Some pain with palpation of costochondral joint bilateral Abdominal: Soft.  There is no tenderness. Psychiatric: Patient has a normal mood and affect. behavior is normal. Judgment and thought content normal. Skin: petechia on left arm ( likely from blood pressure cuff during hospital stay ), erythematous papules on right upper arm - possible contact dermatitis we will try topical medication   Recent Results (from the past 2160 hour(s))  Occult blood card to lab, stool     Status: None   Collection Time: 02/12/16  9:37 AM  Result Value Ref Range   Fecal Occult Bld NEGATIVE NEGATIVE  Occult blood card to lab, stool     Status: None   Collection Time: 02/13/16  8:58 AM  Result Value Ref Range   Fecal Occult Bld NEGATIVE NEGATIVE  Occult blood card to lab, stool     Status: None   Collection Time: 02/14/16  9:29 AM  Result Value Ref Range   Fecal Occult Bld NEGATIVE NEGATIVE  POCT UA - Microalbumin     Status: Abnormal   Collection Time: 03/13/16  9:38 AM  Result Value Ref Range   Microalbumin Ur, POC 50 mg/L   Creatinine, POC  mg/dL   Albumin/Creatinine Ratio, Urine, POC    POCT HgB A1C     Status: Abnormal   Collection Time: 03/13/16  9:42 AM   Result Value Ref Range   Hemoglobin A1C 6.7   CBC with Differential     Status: Abnormal   Collection  Time: 03/20/16 11:30 AM  Result Value Ref Range   WBC 7.8 3.6 - 11.0 K/uL   RBC 4.22 3.80 - 5.20 MIL/uL   Hemoglobin 12.1 12.0 - 16.0 g/dL   HCT 37.0 35.0 - 47.0 %   MCV 87.5 80.0 - 100.0 fL   MCH 28.6 26.0 - 34.0 pg   MCHC 32.7 32.0 - 36.0 g/dL   RDW 15.1 (H) 11.5 - 14.5 %   Platelets 327 150 - 440 K/uL   Neutrophils Relative % 48 %   Neutro Abs 3.7 1.4 - 6.5 K/uL   Lymphocytes Relative 41 %   Lymphs Abs 3.2 1.0 - 3.6 K/uL   Monocytes Relative 7 %   Monocytes Absolute 0.6 0.2 - 0.9 K/uL   Eosinophils Relative 3 %   Eosinophils Absolute 0.2 0 - 0.7 K/uL   Basophils Relative 1 %   Basophils Absolute 0.1 0 - 0.1 K/uL  Ferritin     Status: None   Collection Time: 03/20/16 11:30 AM  Result Value Ref Range   Ferritin 13 11 - 307 ng/mL  ANA,IFA Sjogrens' Pnl rflx Tit/Patn     Status: Abnormal   Collection Time: 04/26/16  4:57 PM  Result Value Ref Range   Rhuematoid fact SerPl-aCnc <10 <=14 IU/mL    Comment:                            Interpretive Table                     Low Positive: 15 - 41 IU/mL                     High Positive:  >= 42 IU/mL    In addition to the RF result, and clinical symptoms including joint  involvement, the 2010 ACR Classification Criteria for  scoring/diagnosing Rheumatoid Arthritis include the results of the  following tests:  CRP (16109), ESR (15010), and CCP (APCA) (60454).  www.rheumatology.org/practice/clinical/classification/ra/ra_2010.asp    Anit Nuclear Antibody(ANA) POS (A) NEGATIVE   SSA (Ro) (ENA) Antibody, IgG <1.0 NEG <1.0 NEG AI   SSB ([place]) (ENA) Antibody, IgG <1.0 NEG <1.0 NEG AI  COMPLETE METABOLIC PANEL WITH GFR     Status: Abnormal   Collection Time: 04/26/16  4:57 PM  Result Value Ref Range   Sodium 136 135 - 146 mmol/L   Potassium 5.0 3.5 - 5.3 mmol/L   Chloride 102 98 - 110 mmol/L   CO2 20 20 - 31 mmol/L   Glucose, Bld  422 (H) 65 - 99 mg/dL    Comment: Result repeated and verified.   BUN 16 7 - 25 mg/dL   Creat 0.96 0.50 - 0.99 mg/dL    Comment:   For patients > or = 65 years of age: The upper reference limit for Creatinine is approximately 13% higher for people identified as African-American.      Total Bilirubin 0.2 0.2 - 1.2 mg/dL   Alkaline Phosphatase 55 33 - 130 U/L   AST 15 10 - 35 U/L   ALT 16 6 - 29 U/L   Total Protein 7.0 6.1 - 8.1 g/dL   Albumin 4.4 3.6 - 5.1 g/dL   Calcium 9.5 8.6 - 10.4 mg/dL   GFR, Est African American 72 >=60 mL/min   GFR, Est Non African American 63 >=60 mL/min  CK total and CKMB (cardiac)not at [facility]     Status: None  Collection Time: 04/26/16  4:57 PM  Result Value Ref Range   Total CK 62 7 - 177 U/L   CK, MB <0.7 0.0 - 5.0 ng/mL   Relative Index SEE NOTE 0.0 - 4.0    Comment: Relative Index not valid when CK is < 100   Result not calculated because one or more required values exceed analytical limits.     Sedimentation rate     Status: None   Collection Time: 04/26/16  4:57 PM  Result Value Ref Range   Sed Rate 2 0 - 30 mm/hr  C-reactive protein     Status: None   Collection Time: 04/26/16  4:57 PM  Result Value Ref Range   CRP <0.5 <0.60 mg/dL  Anti-nuclear ab-titer (ANA titer)     Status: Abnormal   Collection Time: 04/26/16  4:57 PM  Result Value Ref Range   ANA Pattern 1 HOMOGENEOUS (A)    ANA Titer 1 1:40 (H) titer    Comment:           Reference Range           < 1:40      Negative             1:40-1:80 Low Antibody level           > 1:80      Elevated Antibody level   Basic metabolic panel     Status: Abnormal   Collection Time: 04/29/16  4:41 PM  Result Value Ref Range   Sodium 137 135 - 145 mmol/L   Potassium 4.3 3.5 - 5.1 mmol/L   Chloride 106 101 - 111 mmol/L   CO2 20 (L) 22 - 32 mmol/L   Glucose, Bld 406 (H) 65 - 99 mg/dL   BUN 17 6 - 20 mg/dL   Creatinine, Ser 0.96 0.44 - 1.00 mg/dL   Calcium 9.7 8.9 - 10.3 mg/dL   GFR calc  non Af Amer >60 >60 mL/min   GFR calc Af Amer >60 >60 mL/min    Comment: (NOTE) The eGFR has been calculated using the CKD EPI equation. This calculation has not been validated in all clinical situations. eGFR's persistently <60 mL/min signify possible Chronic Kidney Disease.    Anion gap 11 5 - 15  CBC     Status: Abnormal   Collection Time: 04/29/16  4:41 PM  Result Value Ref Range   WBC 6.5 3.6 - 11.0 K/uL   RBC 4.33 3.80 - 5.20 MIL/uL   Hemoglobin 12.9 12.0 - 16.0 g/dL   HCT 39.2 35.0 - 47.0 %   MCV 90.5 80.0 - 100.0 fL   MCH 29.9 26.0 - 34.0 pg   MCHC 33.0 32.0 - 36.0 g/dL   RDW 16.5 (H) 11.5 - 14.5 %   Platelets 270 150 - 440 K/uL  Urinalysis complete, with microscopic     Status: Abnormal   Collection Time: 04/29/16  4:41 PM  Result Value Ref Range   Color, Urine YELLOW (A) YELLOW   APPearance CLEAR (A) CLEAR   Glucose, UA >500 (A) NEGATIVE mg/dL   Bilirubin Urine NEGATIVE NEGATIVE   Ketones, ur TRACE (A) NEGATIVE mg/dL   Specific Gravity, Urine 1.017 1.005 - 1.030   Hgb urine dipstick NEGATIVE NEGATIVE   pH 5.0 5.0 - 8.0   Protein, ur NEGATIVE NEGATIVE mg/dL   Nitrite NEGATIVE NEGATIVE   Leukocytes, UA NEGATIVE NEGATIVE   RBC / HPF 0-5 0 - 5 RBC/hpf   WBC, UA  0-5 0 - 5 WBC/hpf   Bacteria, UA RARE (A) NONE SEEN   Squamous Epithelial / LPF 0-5 (A) NONE SEEN  Glucose, capillary     Status: Abnormal   Collection Time: 04/29/16  4:41 PM  Result Value Ref Range   Glucose-Capillary 400 (H) 65 - 99 mg/dL  Troponin I     Status: Abnormal   Collection Time: 04/29/16  4:41 PM  Result Value Ref Range   Troponin I 0.09 (HH) <0.03 ng/mL    Comment: CRITICAL RESULT CALLED TO, READ BACK BY AND VERIFIED WITH LAURIE ALLEN AT 1952 04/29/16.PMH  Glucose, capillary     Status: Abnormal   Collection Time: 04/29/16  6:06 PM  Result Value Ref Range   Glucose-Capillary 371 (H) 65 - 99 mg/dL  Glucose, capillary     Status: Abnormal   Collection Time: 04/29/16  7:59 PM  Result Value  Ref Range   Glucose-Capillary 268 (H) 65 - 99 mg/dL  Glucose, capillary     Status: Abnormal   Collection Time: 04/29/16 10:10 PM  Result Value Ref Range   Glucose-Capillary 242 (H) 65 - 99 mg/dL  Glucose, capillary     Status: Abnormal   Collection Time: 04/29/16 11:09 PM  Result Value Ref Range   Glucose-Capillary 252 (H) 65 - 99 mg/dL  Troponin I-serum (0, 3, 6 hours)     Status: Abnormal   Collection Time: 04/29/16 11:28 PM  Result Value Ref Range   Troponin I 0.06 (HH) <0.03 ng/mL    Comment: CRITICAL VALUE NOTED. VALUE IS CONSISTENT WITH PREVIOUSLY REPORTED/CALLED VALUE.PMH  Hemoglobin A1c     Status: Abnormal   Collection Time: 04/30/16  4:49 AM  Result Value Ref Range   Hgb A1c MFr Bld 8.3 (H) 4.0 - 6.0 %  Troponin I-serum (0, 3, 6 hours)     Status: None   Collection Time: 04/30/16  4:49 AM  Result Value Ref Range   Troponin I <0.03 <0.03 ng/mL  Lipid panel     Status: Abnormal   Collection Time: 04/30/16  4:49 AM  Result Value Ref Range   Cholesterol 65 0 - 200 mg/dL   Triglycerides 57 <150 mg/dL   HDL 28 (L) >40 mg/dL   Total CHOL/HDL Ratio 2.3 RATIO   VLDL 11 0 - 40 mg/dL   LDL Cholesterol 26 0 - 99 mg/dL    Comment:        Total Cholesterol/HDL:CHD Risk Coronary Heart Disease Risk Table                     Men   Women  1/2 Average Risk   3.4   3.3  Average Risk       5.0   4.4  2 X Average Risk   9.6   7.1  3 X Average Risk  23.4   11.0        Use the calculated Patient Ratio above and the CHD Risk Table to determine the patient's CHD Risk.        ATP III CLASSIFICATION (LDL):  <100     mg/dL   Optimal  100-129  mg/dL   Near or Above                    Optimal  130-159  mg/dL   Borderline  160-189  mg/dL   High  >190     mg/dL   Very High   Glucose, capillary  Status: Abnormal   Collection Time: 04/30/16  7:25 AM  Result Value Ref Range   Glucose-Capillary 302 (H) 65 - 99 mg/dL   Comment 1 Notify RN    Comment 2 Document in Chart    Echocardiogram     Status: None   Collection Time: 04/30/16  8:23 AM  Result Value Ref Range   Weight 3,507.2 oz   Height 66 in   BP 157/85 mmHg  Glucose, capillary     Status: Abnormal   Collection Time: 04/30/16 11:12 AM  Result Value Ref Range   Glucose-Capillary 281 (H) 65 - 99 mg/dL   Comment 1 Notify RN    Comment 2 Document in Chart   Glucose, capillary     Status: Abnormal   Collection Time: 04/30/16  4:29 PM  Result Value Ref Range   Glucose-Capillary 381 (H) 65 - 99 mg/dL   Comment 1 Notify RN    Comment 2 Document in Chart   Glucose, capillary     Status: Abnormal   Collection Time: 04/30/16  9:15 PM  Result Value Ref Range   Glucose-Capillary 134 (H) 65 - 99 mg/dL  Basic metabolic panel     Status: Abnormal   Collection Time: 05/01/16  4:30 AM  Result Value Ref Range   Sodium 136 135 - 145 mmol/L   Potassium 3.6 3.5 - 5.1 mmol/L   Chloride 104 101 - 111 mmol/L   CO2 24 22 - 32 mmol/L   Glucose, Bld 322 (H) 65 - 99 mg/dL   BUN 19 6 - 20 mg/dL   Creatinine, Ser 0.66 0.44 - 1.00 mg/dL   Calcium 9.1 8.9 - 10.3 mg/dL   GFR calc non Af Amer >60 >60 mL/min   GFR calc Af Amer >60 >60 mL/min    Comment: (NOTE) The eGFR has been calculated using the CKD EPI equation. This calculation has not been validated in all clinical situations. eGFR's persistently <60 mL/min signify possible Chronic Kidney Disease.    Anion gap 8 5 - 15  Glucose, capillary     Status: Abnormal   Collection Time: 05/01/16  7:50 AM  Result Value Ref Range   Glucose-Capillary 326 (H) 65 - 99 mg/dL  NM Myocar Multi W/Spect W/Wall Motion / EF     Status: None (In process)   Collection Time: 05/01/16 12:09 PM  Result Value Ref Range   Rest HR 100 bpm   Rest BP 119/68 mmHg   Peak HR 117 bpm   Peak BP 130/61 mmHg   SSS 1    SRS 2    SDS 2    TID 0.88    LV sys vol 36 mL   LV dias vol 59 46 - 106 mL  Glucose, capillary     Status: Abnormal   Collection Time: 05/01/16 12:23 PM  Result  Value Ref Range   Glucose-Capillary 215 (H) 65 - 99 mg/dL  Glucose, capillary     Status: Abnormal   Collection Time: 05/01/16  5:04 PM  Result Value Ref Range   Glucose-Capillary 348 (H) 65 - 99 mg/dL  Glucose, capillary     Status: Abnormal   Collection Time: 05/01/16  9:19 PM  Result Value Ref Range   Glucose-Capillary 254 (H) 65 - 99 mg/dL  Basic metabolic panel     Status: Abnormal   Collection Time: 05/02/16  3:46 AM  Result Value Ref Range   Sodium 137 135 - 145 mmol/L   Potassium 3.7 3.5 -  5.1 mmol/L   Chloride 104 101 - 111 mmol/L   CO2 25 22 - 32 mmol/L   Glucose, Bld 265 (H) 65 - 99 mg/dL   BUN 24 (H) 6 - 20 mg/dL   Creatinine, Ser 0.67 0.44 - 1.00 mg/dL   Calcium 9.2 8.9 - 10.3 mg/dL   GFR calc non Af Amer >60 >60 mL/min   GFR calc Af Amer >60 >60 mL/min    Comment: (NOTE) The eGFR has been calculated using the CKD EPI equation. This calculation has not been validated in all clinical situations. eGFR's persistently <60 mL/min signify possible Chronic Kidney Disease.    Anion gap 8 5 - 15  Glucose, capillary     Status: Abnormal   Collection Time: 05/02/16  7:36 AM  Result Value Ref Range   Glucose-Capillary 293 (H) 65 - 99 mg/dL  Glucose, capillary     Status: Abnormal   Collection Time: 05/02/16 11:19 AM  Result Value Ref Range   Glucose-Capillary 281 (H) 65 - 99 mg/dL     PHQ2/9: Depression screen [facility] 2/9 04/26/2016 03/13/2016 02/02/2016 11/01/2015 09/10/2015  Decreased Interest 0 0 0 0 0  Down, Depressed, Hopeless 0 0 0 0 0  PHQ - 2 Score 0 0 0 0 0     Fall Risk: Fall Risk  04/26/2016 03/13/2016 02/02/2016 11/01/2015 09/10/2015  Falls in the past year? _0      Assessment & Plan  1. Diabetes mellitus due to underlying condition, uncontrolled, with stage 3 chronic kidney disease, with long-term current use of insulin (HCC)  Continue medication, discussed how to titrated Lantus up - Insulin Glargine (LANTUS SOLOSTAR) 100 UNIT/ML Solostar Pen;  Inject 28-50 Units into the skin daily.  Dispense: 15 mL; Refill: 2 - sitaGLIPtin-metformin (JANUMET) 50-1000 MG tablet; Take 1 tablet by mouth 2 (two) times daily.  Dispense: 180 tablet; Refill: 1   2. Hospital discharge follow-up  Doing well since discharge, compliant with diabetic diet, and logging her glucose  3. Angina pectoris ([place])  - Ambulatory referral to Cardiology  4. Rash  - triamcinolone cream (KENALOG) 0.1 %; Apply 1 application topically 2 (two) times daily.  Dispense: 30 g; Refill: 0

## 2016-05-10 NOTE — Addendum Note (Signed)
Addended by: Steele Sizer F on: 05/10/2016 04:20 PM   Modules accepted: Orders

## 2016-05-11 ENCOUNTER — Other Ambulatory Visit: Payer: Self-pay | Admitting: *Deleted

## 2016-05-11 ENCOUNTER — Encounter: Payer: Self-pay | Admitting: *Deleted

## 2016-05-11 DIAGNOSIS — I340 Nonrheumatic mitral (valve) insufficiency: Secondary | ICD-10-CM | POA: Diagnosis not present

## 2016-05-11 DIAGNOSIS — E1122 Type 2 diabetes mellitus with diabetic chronic kidney disease: Secondary | ICD-10-CM | POA: Diagnosis not present

## 2016-05-11 DIAGNOSIS — N183 Chronic kidney disease, stage 3 (moderate): Secondary | ICD-10-CM | POA: Diagnosis not present

## 2016-05-11 DIAGNOSIS — I10 Essential (primary) hypertension: Secondary | ICD-10-CM | POA: Diagnosis not present

## 2016-05-11 DIAGNOSIS — G4733 Obstructive sleep apnea (adult) (pediatric): Secondary | ICD-10-CM | POA: Diagnosis not present

## 2016-05-11 DIAGNOSIS — I2510 Atherosclerotic heart disease of native coronary artery without angina pectoris: Secondary | ICD-10-CM | POA: Diagnosis not present

## 2016-05-11 NOTE — Patient Outreach (Addendum)
[facility]) Care Management   05/11/2016  Dana Sparks March 03, 1951 [id number]  Dana Sparks is an 65 y.o. female  Subjective:  Pt reports saw Dr. Ancil Boozer yesterday, was told sugars still in 200 range, instructed To go up 2 units on her insulin every 3 days to keep sugars in am 90-140.  Pt reports sugar this Am 154.  Pt reports HH RN came today, reviewed her food intake. Pt reports used her CPAP for 7 days, came off during the night.   Pt reports she realized she has been using the same mask For 2 years, called the company and they are going to send out new supplies.    Objective:   Vitals:   05/11/16 1428  BP: 118/70  Pulse: 77  Resp: (!) 24    ROS  Physical Exam  Constitutional: She is oriented to person, place, and time. She appears well-developed and well-nourished.  Cardiovascular: Normal rate, regular rhythm and normal heart sounds.   Respiratory: Effort normal and breath sounds normal.  GI: Soft. Bowel sounds are normal.  Musculoskeletal: Normal range of motion.  Neurological: She is alert and oriented to person, place, and time.  Skin: Skin is warm and dry.  Psychiatric: She has a normal mood and affect. Her behavior is normal. Judgment and thought content normal.    Encounter Medications:  Reviewed with pt.  Outpatient Encounter Prescriptions as of 05/11/2016  Medication Sig Note  . allopurinol (ZYLOPRIM) 100 MG tablet Take 1 tablet (100 mg total) by mouth daily. (Patient taking differently: Take 100 mg by mouth every morning. )   . amLODipine (NORVASC) 5 MG tablet TAKE 1 TABLET EVERY DAY (Patient taking differently: TAKE 1 TABLET EVERY MORNING.)   . aspirin EC 81 MG tablet Take 81 mg by mouth every morning.    . benzonatate (TESSALON) 100 MG capsule Take 100 mg by mouth every 4 (four) hours as needed for cough. 05/11/2016: As needed.   . blood glucose meter kit and supplies KIT Dispense based on patient and insurance preference. Use up to four times daily  as directed. (FOR ICD-9 250.00, 250.01). 05/11/2016: Pt uses Accu-check   . citalopram (CELEXA) 20 MG tablet Take 1 tablet (20 mg total) by mouth daily. (Patient taking differently: Take 20 mg by mouth every morning. )   . Ferrous Sulfate (IRON) 325 (65 Fe) MG TABS Take by mouth. Petra Kuba made brand-  Pt takes one tablet twice a day   . fluticasone (FLONASE) 50 MCG/ACT nasal spray Place 2 sprays into both nostrils daily. (Patient taking differently: Place 2 sprays into both nostrils daily as needed for allergies. )   . Insulin Glargine (LANTUS SOLOSTAR) 100 UNIT/ML Solostar Pen Inject 28-50 Units into the skin daily. 05/11/2016: Per MD- pt to go up 2 units every 3 days to keep sugar in am 90-140  . Insulin Pen Needle (PEN NEEDLES) 31G X 6 MM MISC Use for insulin pen   . isosorbide mononitrate (IMDUR) 60 MG 24 hr tablet TAKE 1 TABLET EVERY DAY (Patient taking differently: TAKE 1 TABLET EVERY DAY IN THE MORNING.)   . lisinopril (PRINIVIL,ZESTRIL) 40 MG tablet Take 1 tablet (40 mg total) by mouth daily. 05/11/2016: Per pt, taking twice a day   . metoprolol succinate (TOPROL-XL) 50 MG 24 hr tablet Take 1 tablet (50 mg total) by mouth daily. (Patient taking differently: Take 50 mg by mouth every morning. )   . pantoprazole (PROTONIX) 40 MG tablet TAKE 1 TABLET  EVERY MORNING   . rosuvastatin (CRESTOR) 20 MG tablet TAKE 1 TABLET EVERY DAY (Patient taking differently: TAKE 1 TABLET EVERY MORNING.)   . sitaGLIPtin-metformin (JANUMET) 50-1000 MG tablet Take 1 tablet by mouth 2 (two) times daily.   Marland Kitchen torsemide (DEMADEX) 20 MG tablet Take 1 tablet (20 mg total) by mouth daily. 05/03/2016: Per pt not been taking daily, to start per discharge summary.   . traMADol (ULTRAM) 50 MG tablet Take 1 tablet (50 mg total) by mouth every 6 (six) hours as needed for moderate pain. 05/11/2016: As needed.   . triamcinolone cream (KENALOG) 0.1 % Apply 1 application topically 2 (two) times daily.   Marland Kitchen alum & mag hydroxide-simeth  (MAALOX/MYLANTA) 200-200-20 MG/5ML suspension Take 30 mLs by mouth every 6 (six) hours as needed for indigestion or heartburn. (Patient not taking: Reported on 05/11/2016) 05/11/2016: Pt reports did not get yet.   . Coenzyme Q10 (COQ10) 100 MG CAPS Take 1 capsule by mouth daily. (Patient not taking: Reported on 05/11/2016) 05/11/2016: Per pt, did not get yet.   Marland Kitchen estradiol (ESTRACE) 0.5 MG tablet Take 1 tablet (0.5 mg total) by mouth daily. (Patient not taking: Reported on 05/11/2016)   . ferrous sulfate 325 (65 FE) MG tablet Take 1 tablet (325 mg total) by mouth 2 (two) times daily with a meal. (Patient not taking: Reported on 05/11/2016)   . fexofenadine (ALLEGRA) 180 MG tablet Take 180 mg by mouth daily as needed for allergies.  05/11/2016: Pt needs to get.   . loratadine (CLARITIN) 10 MG tablet Take 10 mg by mouth daily as needed for allergies or rhinitis.    . potassium chloride (K-DUR,KLOR-CON) 10 MEQ tablet Take 1 tablet (10 mEq total) by mouth daily as needed. (Patient not taking: Reported on 05/11/2016)    No facility-administered encounter medications on file as of 05/11/2016.     Functional Status:   In your present state of health, do you have any difficulty performing the following activities: 05/11/2016 04/29/2016  Hearing? N N  Vision? N N  Difficulty concentrating or making decisions? N N  Walking or climbing stairs? N N  Dressing or bathing? N N  Doing errands, shopping? N N  Preparing Food and eating ? N -  Using the Toilet? N -  In the past six months, have you accidently leaked urine? N -  Do you have problems with loss of bowel control? N -  Managing your Medications? N -  Managing your Finances? N -  Housekeeping or managing your Housekeeping? N -  Some recent data might be hidden    Fall/Depression Screening:    PHQ 2/9 Scores 05/11/2016 04/26/2016 03/13/2016 02/02/2016 11/01/2015 09/10/2015 06/28/2015  PHQ - 2 Score 1 0 0 0 0 0 0    Assessment:  Pleasant 65 year old woman, resides with  spouse.                            DM- review of pt's blood sugar averages via glucometer, 7 day-185,14 day-225, 30 day-                             228.  Ranges 128-282 am, 92-405 pm.  Provided/reviewed with pt  Emmi information, DM-  Controlling blood sugar, How type 2 DM can affect your body.  Also provided/reviewed                             Information on Carb counting.   Plan:  Pt to continue to check sugars/record/ adjust insulin as ordered every 3 days to keep am sugars              90-140.            Pt to start back use of CPAP once receive new supplies.             Plan to continue to follow pt for transition of care, follow up again next week telephonically.             Plan to send Dr. Ancil Boozer by in basket 9/7 home visit encounter.    [facility] CM Care Plan Problem One   Flowsheet Row Most Recent Value  Care Plan Problem One  Risk for readmission related to recent hospitalization for hyperglycemia, chest pain   Role Documenting the Problem One  Care Management [place] for Problem One  Active  [facility]N Long Term Goal (31-90 days)  Pt would not readmit within the next 31 days   [facility]N Long Term Goal Start Date  (P) 05/03/16  Interventions for Problem One Long Term Goal  (P) Home visit done-   [facility]N CM Short Term Goal #1 (0-30 days)  (P) Pt would take all medications as ordered for the next 30 days   [facility]N CM Short Term Goal #1 Start Date  (P) 05/03/16  Interventions for Short Term Goal #1  (P) Reinforced with pt importance of taking all medications- still needs to get Maalox and Coq10      Elaijah Munoz M.   [place] Care Management  [phone]

## 2016-05-12 ENCOUNTER — Other Ambulatory Visit: Payer: Self-pay

## 2016-05-15 NOTE — Patient Outreach (Signed)
Outreach to patient to discuss pharmacy needs.  I reviewed medications and determined patient would benefit from a home visit.  I will make a home visit on Tuesday, May 16, 2016.   Deanne Coffer, PharmD, [place] [phone]

## 2016-05-16 ENCOUNTER — Other Ambulatory Visit: Payer: Self-pay

## 2016-05-16 DIAGNOSIS — N183 Chronic kidney disease, stage 3 (moderate): Secondary | ICD-10-CM | POA: Diagnosis not present

## 2016-05-16 DIAGNOSIS — I2510 Atherosclerotic heart disease of native coronary artery without angina pectoris: Secondary | ICD-10-CM | POA: Diagnosis not present

## 2016-05-16 DIAGNOSIS — E1122 Type 2 diabetes mellitus with diabetic chronic kidney disease: Secondary | ICD-10-CM | POA: Diagnosis not present

## 2016-05-16 DIAGNOSIS — I10 Essential (primary) hypertension: Secondary | ICD-10-CM | POA: Diagnosis not present

## 2016-05-16 DIAGNOSIS — I340 Nonrheumatic mitral (valve) insufficiency: Secondary | ICD-10-CM | POA: Diagnosis not present

## 2016-05-17 ENCOUNTER — Other Ambulatory Visit: Payer: Self-pay | Admitting: *Deleted

## 2016-05-17 DIAGNOSIS — I2510 Atherosclerotic heart disease of native coronary artery without angina pectoris: Secondary | ICD-10-CM | POA: Diagnosis not present

## 2016-05-17 DIAGNOSIS — N183 Chronic kidney disease, stage 3 (moderate): Secondary | ICD-10-CM | POA: Diagnosis not present

## 2016-05-17 DIAGNOSIS — I209 Angina pectoris, unspecified: Secondary | ICD-10-CM | POA: Diagnosis not present

## 2016-05-17 DIAGNOSIS — F329 Major depressive disorder, single episode, unspecified: Secondary | ICD-10-CM | POA: Diagnosis not present

## 2016-05-17 DIAGNOSIS — E669 Obesity, unspecified: Secondary | ICD-10-CM | POA: Diagnosis not present

## 2016-05-17 DIAGNOSIS — I10 Essential (primary) hypertension: Secondary | ICD-10-CM | POA: Diagnosis not present

## 2016-05-17 DIAGNOSIS — E119 Type 2 diabetes mellitus without complications: Secondary | ICD-10-CM | POA: Diagnosis not present

## 2016-05-17 DIAGNOSIS — Z87891 Personal history of nicotine dependence: Secondary | ICD-10-CM | POA: Diagnosis not present

## 2016-05-17 DIAGNOSIS — E785 Hyperlipidemia, unspecified: Secondary | ICD-10-CM | POA: Diagnosis not present

## 2016-05-17 NOTE — Patient Outreach (Signed)
Attempt made to contact pt for transition of care- ongoing follow up on recent hospitalization 8/26-8/29 for Shortness of breath,hyperglycemia, chest pain.   This RN CM called pt's home phone, phone  kept ringing, unable to leave a voice message.  Also called pt's mobile number, HIPAA compliant voice message left with contact number.   Plan: If no response, will attempt to call pt again within next 7 days.    Zara Chess.   [place] Care Management  [phone]

## 2016-05-17 NOTE — Patient Outreach (Signed)
[facility]) Care Management  [place]   05/17/2016  Dana Sparks 08/23/1951 [id number]  Subjective: Dana Sparks is a 65 year old female who was referred to pharmacy for a medication review and to answer questions she had in regards to her medications. She was hospitalized from  04/29/16 - 05/02/16 for hyperglycemia and chest pain.  She stated she did not know why she was on Coenzyme Q 10 and wanted to go over the discharge medications.  I reviewed why Coenzyme Q10 was recommended which was due to her complaints of myalgias/muscle pain.  I stated that she may be able to get it from [facility] if she gets the benefit with her insurance.  I gave her the number to call to determine if she gets that benefit.  I reviewed her medications with her and determined that she is taking her lisinopril twice a day, which she was doing prior to her hospitalization.  During her hospitalization, it was decreased to once a day.  It is unclear why it was decreased to once a day.  Dana Sparks was not aware it was decreased.  She has a lot of questions in regards to her diet today.  We spent the majority of the visit discussing diet.    Objective:   Encounter Medications: Outpatient Encounter Prescriptions as of 05/16/2016  Medication Sig Note  . allopurinol (ZYLOPRIM) 100 MG tablet Take 1 tablet (100 mg total) by mouth daily.   Marland Kitchen amLODipine (NORVASC) 5 MG tablet TAKE 1 TABLET EVERY DAY   . aspirin EC 81 MG tablet Take 81 mg by mouth every morning.    . benzonatate (TESSALON) 100 MG capsule Take 100 mg by mouth every 4 (four) hours as needed for cough. 05/11/2016: As needed.   . blood glucose meter kit and supplies KIT Dispense based on patient and insurance preference. Use up to four times daily as directed. (FOR ICD-9 250.00, 250.01). 05/11/2016: Pt uses Accu-check   . citalopram (CELEXA) 20 MG tablet Take 1 tablet (20 mg total) by mouth daily.   . Coenzyme Q10 (COQ10) 100 MG CAPS Take 1 capsule by mouth  daily.   . Ferrous Sulfate (IRON) 325 (65 Fe) MG TABS Take by mouth. Petra Kuba made brand-  Pt takes one tablet twice a day   . fluticasone (FLONASE) 50 MCG/ACT nasal spray Place 2 sprays into both nostrils daily. (Patient taking differently: Place 2 sprays into both nostrils daily as needed for allergies. )   . Insulin Glargine (LANTUS SOLOSTAR) 100 UNIT/ML Solostar Pen Inject 28-50 Units into the skin daily. 05/11/2016: Per MD- pt to go up 2 units every 3 days to keep sugar in am 90-140  . Insulin Pen Needle (PEN NEEDLES) 31G X 6 MM MISC Use for insulin pen   . isosorbide mononitrate (IMDUR) 60 MG 24 hr tablet TAKE 1 TABLET EVERY DAY   . lisinopril (PRINIVIL,ZESTRIL) 40 MG tablet Take 1 tablet (40 mg total) by mouth daily. 05/11/2016: Per pt, taking twice a day   . metoprolol succinate (TOPROL-XL) 50 MG 24 hr tablet Take 1 tablet (50 mg total) by mouth daily.   . pantoprazole (PROTONIX) 40 MG tablet TAKE 1 TABLET EVERY MORNING   . potassium chloride (K-DUR,KLOR-CON) 10 MEQ tablet Take 1 tablet (10 mEq total) by mouth daily as needed.   . rosuvastatin (CRESTOR) 20 MG tablet TAKE 1 TABLET EVERY DAY   . sitaGLIPtin-metformin (JANUMET) 50-1000 MG tablet Take 1 tablet by mouth 2 (two)  times daily.   Marland Kitchen triamcinolone cream (KENALOG) 0.1 % Apply 1 application topically 2 (two) times daily.   Marland Kitchen alum & mag hydroxide-simeth (MAALOX/MYLANTA) 200-200-20 MG/5ML suspension Take 30 mLs by mouth every 6 (six) hours as needed for indigestion or heartburn. (Patient not taking: Reported on 05/11/2016) 05/11/2016: Pt reports did not get yet.   Marland Kitchen estradiol (ESTRACE) 0.5 MG tablet Take 1 tablet (0.5 mg total) by mouth daily. (Patient not taking: Reported on 05/11/2016)   . fexofenadine (ALLEGRA) 180 MG tablet Take 180 mg by mouth daily as needed for allergies.  05/11/2016: Pt needs to get.   . torsemide (DEMADEX) 20 MG tablet Take 1 tablet (20 mg total) by mouth daily. (Patient not taking: Reported on 05/12/2016) 05/03/2016: Per pt not  been taking daily, to start per discharge summary.   . traMADol (ULTRAM) 50 MG tablet Take 1 tablet (50 mg total) by mouth every 6 (six) hours as needed for moderate pain. (Patient not taking: Reported on 05/12/2016)    No facility-administered encounter medications on file as of 05/16/2016.     Functional Status: In your present state of health, do you have any difficulty performing the following activities: 05/11/2016 04/29/2016  Hearing? N N  Vision? N N  Difficulty concentrating or making decisions? N N  Walking or climbing stairs? N N  Dressing or bathing? N N  Doing errands, shopping? N N  Preparing Food and eating ? N -  Using the Toilet? N -  In the past six months, have you accidently leaked urine? N -  Do you have problems with loss of bowel control? N -  Managing your Medications? N -  Managing your Finances? N -  Housekeeping or managing your Housekeeping? N -  Some recent data might be hidden    Fall/Depression Screening: PHQ 2/9 Scores 05/11/2016 04/26/2016 03/13/2016 02/02/2016 11/01/2015 09/10/2015 06/28/2015  PHQ - 2 Score 1 0 0 0 0 0 0    Assessment:  Patient was recently discharged from hospital and all medications have been reviewed.   Drugs sorted by system:  Neurologic/Psychologic: citalopram  Cardiovascular: amlodipine, aspirin, isosorbide mononitrate, lisinopril, metoprolol succinate, torsemide, rosuvastatin  Pulmonary/Allergy: benzonatate, fluticasone, fexofenadine  Gastrointestinal: pantoprazole   Endocrine: Lantus, Janumet  Renal: none  Topical: triamcinolone cream  Pain: tramadol  Vitamins/Minerals: coenzyme q 10, ferrous sulfate, potassium  Infectious Diseases:  Miscellaneous: allopurinol   Duplications in therapy:  None  Gaps in therapy:  none  Medications to avoid in the elderly: Pantoprazole: (increase risk of fractures and development of C. Diff)  Drug interactions: none  Other issues noted: Lisinopril prescribed once a day and  patient is taking twice a day.   Plan: 1.  No drug interactions or side effects noted upon medication review with Dana Sparks.   2.  Mrs. Germany has been taking Lisinopril twice a day which different then the discharge summary.  I will reach out to Dr. Ancil Boozer and clarify how she should be taking her lisinopril.  3.  I spent the majority of the visit reviewing carbohydrate counting and what foods are considered carbohydrates.  I reviewed specific foods in her pantry and reviewed portion sizes as well with her.  I reviewed the plate method and discussed the importance of having the larger portion of the plate to be filled with nonstarchy vegetables.  After the discussion, Ms. Meinders seemed to have a better understanding of how she should be managing her diet.   4.  Since all of her  pharmacy needs have been met, I will close her out to pharmacy.  I have updated Rose, her nurse care coordinator as well.    Deanne Coffer, PharmD, [place] [phone]

## 2016-05-23 DIAGNOSIS — I10 Essential (primary) hypertension: Secondary | ICD-10-CM | POA: Diagnosis not present

## 2016-05-23 DIAGNOSIS — E1122 Type 2 diabetes mellitus with diabetic chronic kidney disease: Secondary | ICD-10-CM | POA: Diagnosis not present

## 2016-05-23 DIAGNOSIS — I340 Nonrheumatic mitral (valve) insufficiency: Secondary | ICD-10-CM | POA: Diagnosis not present

## 2016-05-23 DIAGNOSIS — N183 Chronic kidney disease, stage 3 (moderate): Secondary | ICD-10-CM | POA: Diagnosis not present

## 2016-05-23 DIAGNOSIS — I2510 Atherosclerotic heart disease of native coronary artery without angina pectoris: Secondary | ICD-10-CM | POA: Diagnosis not present

## 2016-05-25 ENCOUNTER — Other Ambulatory Visit: Payer: Self-pay | Admitting: *Deleted

## 2016-05-25 ENCOUNTER — Ambulatory Visit: Payer: Self-pay | Admitting: *Deleted

## 2016-05-25 NOTE — Patient Outreach (Signed)
Second attempt to contact pt as part of ongoing transition of care.   Called pt on home phone, unable to leave a voice message.  Called pt on her mobile number, voice message left with contact name and number.      Plan:  If no response from pt,  plan to call again next week.     Zara Chess.   [place] Care Management  [phone]

## 2016-05-30 LAB — NM MYOCAR MULTI W/SPECT W/WALL MOTION / EF
CHL CUP NUCLEAR SDS: 2
CHL CUP NUCLEAR SRS: 2
CHL CUP NUCLEAR SSS: 1
CSEPPHR: 117 {beats}/min
LV dias vol: 59 mL (ref 46–106)
LVSYSVOL: 36 mL
Rest HR: 100 {beats}/min
TID: 0.88

## 2016-06-02 ENCOUNTER — Other Ambulatory Visit: Payer: Self-pay | Admitting: *Deleted

## 2016-06-02 NOTE — Patient Outreach (Signed)
Transition  of care call successful, ongoing follow up on recent hospitalization 8/26-8/29 for sob, hyperglycemia,chest pain.  Spoke with pt, HIPAA verified.   Pt reports taking all her medications, continues to take Lisinopril 40 mg bid as no one got back to her about decreasing it, f/u with MD - was not addressed.   Pt reports sugars are doing good, reviewed results with RN CM - am range 102-156 (one time), pm 131-189.  Pt reports she continues to take 28 units of Lantus, has not had to increase dosage.  Pt reports compliant with diet, at times exercises.   Pt reports using her CPAP, don't see a difference, someone is suppose to come out tomorrow and check it.      Plan:  RN CM informed pt today was final transition of care call to close case but plan to follow up one more  Time  next week telephonically- check on status,this will be final  transition of care.    Zara Chess.   [place] Care Management  [phone]

## 2016-06-05 ENCOUNTER — Other Ambulatory Visit: Payer: Self-pay

## 2016-06-05 ENCOUNTER — Encounter: Payer: Self-pay | Admitting: *Deleted

## 2016-06-05 ENCOUNTER — Other Ambulatory Visit: Payer: Self-pay | Admitting: *Deleted

## 2016-06-05 NOTE — Patient Outreach (Signed)
Transition (final) of care call successful, follow up on recent hospitalization 8/26-8/29 hyperglycemia, chest pain.   Spoke with pt, HIPAA verified.   Pt reports sugars went up twice over the weekend, 200 x two, 198 once.  Pt reports tried different things ( liver pudding) because currently on  iron medication because iron was low, was also told to eat foods that include iron.     RN CM discussed with pt to always look at ingredients to which pt voiced understanding.  RN CM also discussed talked to [facility] pharmacist about her Lisinopril dosage to which Dawn reports note was sent to  MD did not hear back, to  send another note.   Plan: As discussed with pt, plan to close case- no further nurse case management needs.            Case closure letter to be sent to pt.               Plan to send case closure letter to Dr Ancil Boozer.           Plan to inform [facility] case manager assistant to             Close case.

## 2016-06-05 NOTE — Patient Outreach (Signed)
I received notification from Dr. Ancil Boozer that [name] will need to stay on lisinopril 40 mg twice a day until her next office visit with her.  I have called Dana Sparks and told her to stay on this dose.  She stated she understood.  I am happy to assist with future pharmacy issues as they arise.    Deanne Coffer, PharmD, [place] [phone]

## 2016-06-09 ENCOUNTER — Other Ambulatory Visit: Payer: Self-pay

## 2016-06-09 NOTE — Telephone Encounter (Signed)
Patient requesting refill of BD Single Use Swab to [facility].

## 2016-06-11 MED ORDER — BD SWAB SINGLE USE REGULAR PADS: 1.0000 | MEDICATED_PAD | Freq: Four times a day (QID) | 2 refills | Status: AC

## 2016-06-13 ENCOUNTER — Ambulatory Visit (INDEPENDENT_AMBULATORY_CARE_PROVIDER_SITE_OTHER): Payer: Commercial Managed Care - HMO | Admitting: Family Medicine

## 2016-06-13 ENCOUNTER — Encounter: Payer: Self-pay | Admitting: Family Medicine

## 2016-06-13 VITALS — BP 118/68 | HR 79 | Temp 97.9°F | Resp 16 | Ht 66.0 in | Wt 220.5 lb

## 2016-06-13 DIAGNOSIS — Z794 Long term (current) use of insulin: Secondary | ICD-10-CM | POA: Diagnosis not present

## 2016-06-13 DIAGNOSIS — E1143 Type 2 diabetes mellitus with diabetic autonomic (poly)neuropathy: Secondary | ICD-10-CM

## 2016-06-13 DIAGNOSIS — E1129 Type 2 diabetes mellitus with other diabetic kidney complication: Secondary | ICD-10-CM

## 2016-06-13 DIAGNOSIS — R809 Proteinuria, unspecified: Secondary | ICD-10-CM | POA: Diagnosis not present

## 2016-06-13 DIAGNOSIS — Z23 Encounter for immunization: Secondary | ICD-10-CM | POA: Diagnosis not present

## 2016-06-13 DIAGNOSIS — E1165 Type 2 diabetes mellitus with hyperglycemia: Secondary | ICD-10-CM

## 2016-06-13 DIAGNOSIS — IMO0002 Reserved for concepts with insufficient information to code with codable children: Secondary | ICD-10-CM

## 2016-06-13 LAB — POCT GLYCOSYLATED HEMOGLOBIN (HGB A1C): HEMOGLOBIN A1C: 7.3

## 2016-06-13 MED ORDER — INSULIN DEGLUDEC-LIRAGLUTIDE 100-3.6 UNIT-MG/ML ~~LOC~~ SOPN: 28.0000 [IU] | PEN_INJECTOR | Freq: Every day | SUBCUTANEOUS | 0 refills | Status: DC

## 2016-06-13 MED ORDER — METFORMIN HCL ER 750 MG PO TB24: 1500.0000 mg | ORAL_TABLET | Freq: Every day | ORAL | 0 refills | Status: DC

## 2016-06-13 NOTE — Progress Notes (Signed)
Name: Dana Sparks   MRN: [id number]    DOB: 12-Jun-1951   Date:06/13/2016       Progress Note  Subjective  Chief Complaint  Chief Complaint  Patient presents with  . Diabetes    pt checks BS daily low 82 high 233 avg 120-160    HPI  DMII with gastroparesis/CKI: she is on Lantus since hospital stay for chest pain back in 05/2016/ Glucose was very well controlled up to October 1st when she started to feel very hungry and fasting level has gone up to 150's at times. She is on the donut hole but is waiting for [employer] to cover her medications. . She is on Ace for diabetic CKI, urine micro is up to 50  states gastroparesis symptoms are controlled - not on medication.HgbA1C is down to 7.3%, still not at goal, discussed adding a GLP-1 agonist to her regiment and she is wiling to try, she is willing to try it. We will try Xultophy  Patient Active Problem List   Diagnosis Date Noted  . Elevated antinuclear antibody (ANA) level 04/27/2016  . Angina pectoris ([place]) 11/01/2015  . Well controlled type 2 diabetes mellitus with gastroparesis ([place]) 06/22/2015  . Low TSH level 06/22/2015  . Iron deficiency anemia 04/07/2015  . Intestinal metaplasia of gastric mucosa 03/11/2015  . CAD (coronary artery disease), native coronary artery 02/24/2015  . History of coronary artery stent placement 02/24/2015  . Chronic kidney disease, stage 3, mod decreased GFR 02/24/2015  . Menopause 02/24/2015  . History of Helicobacter pylori infection 02/24/2015  . Mild mitral insufficiency 02/24/2015  . Mild tricuspid insufficiency 02/24/2015  . 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 Controlled gout 02/24/2015  . Depression, major, recurrent, mild ([place]) 02/24/2015  . Morbid obesity due to excess calories ([place]) 02/24/2015  . Mild pulmonary hypertension 02/24/2015  . Gastroesophageal reflux disease without esophagitis 02/24/2015  . Perennial allergic rhinitis 02/24/2015  .  HLD (hyperlipidemia) 02/20/2015  . Hypertension, benign 02/20/2015  . Apnea, sleep 02/20/2015  . Controlled diabetes mellitus with stage 3 chronic kidney disease, without long-term current use of insulin ([place]) 02/20/2015  . Thrombocytosis ([place]) 01/28/2015    Past Surgical History:  Procedure Laterality Date  . ABDOMINAL HYSTERECTOMY     total  . CATARACT EXTRACTION    . COLONOSCOPY  01/2014   sigmoid diverticulosis. desc colon TA, hyperplastic polyp  . CORONARY ANGIOPLASTY WITH STENT PLACEMENT    . ESOPHAGOGASTRODUODENOSCOPY N/A 02/16/2015   negative h pylori, focal gastic intestinal metaplasia  . TOTAL KNEE ARTHROPLASTY Right     Family History  Problem Relation Age of Onset  . Cancer Sister     unsure  . Alcohol abuse Brother   . Hypertension Mother   . Diabetes Mother   . Heart disease Mother   . Diabetes Father   . Heart disease Father   . Hypertension Father   . Colon cancer Neg Hx   . Liver disease Neg Hx   . Breast cancer Neg Hx     Social History   Social History  . Marital status: Married    Spouse name: N/A  . Number of children: N/A  . Years of education: N/A   Occupational History  . Not on file.   Social History Main Topics  . Smoking status: Former Smoker    Packs/day: 1.00    Years: 40.00    Types: Cigarettes    Quit date: 12/30/2012  .  Smokeless tobacco: Never Used  . Alcohol use No  . Drug use: No  . Sexual activity: Not Currently   Other Topics Concern  . Not on file   Social History Narrative  . No narrative on file     Current Outpatient Prescriptions:  .  ACCU-CHEK FASTCLIX LANCETS MISC, , Disp: , Rfl:  .  ACCU-CHEK SMARTVIEW test strip, , Disp: , Rfl:  .  Alcohol Swabs (B-D SINGLE USE SWABS REGULAR) PADS, 1 each by Does not apply route 4 (four) times daily., Disp: 200 each, Rfl: 2 .  allopurinol (ZYLOPRIM) 100 MG tablet, Take 1 tablet (100 mg total) by mouth daily., Disp: 90 tablet, Rfl: 3 .  alum & mag hydroxide-simeth  (MAALOX/MYLANTA) 412-878-67 MG/5ML suspension, Take 30 mLs by mouth every 6 (six) hours as needed for indigestion or heartburn. (Patient not taking: Reported on 05/11/2016), Disp: 355 mL, Rfl: 0 .  amLODipine (NORVASC) 5 MG tablet, TAKE 1 TABLET EVERY DAY, Disp: 90 tablet, Rfl: 1 .  aspirin EC 81 MG tablet, Take 81 mg by mouth every morning. , Disp: , Rfl:  .  blood glucose meter kit and supplies KIT, Dispense based on patient and insurance preference. Use up to four times daily as directed. (FOR ICD-9 250.00, 250.01)., Disp: 1 each, Rfl: 0 .  citalopram (CELEXA) 20 MG tablet, Take 1 tablet (20 mg total) by mouth daily., Disp: 90 tablet, Rfl: 3 .  Coenzyme Q10 (COQ10) 100 MG CAPS, Take 1 capsule by mouth daily., Disp: 90 each, Rfl: 1 .  Ferrous Sulfate (IRON) 325 (65 Fe) MG TABS, Take by mouth. Nature made brand-  Pt takes one tablet twice a day, Disp: , Rfl:  .  fexofenadine (ALLEGRA) 180 MG tablet, Take 180 mg by mouth daily as needed for allergies. , Disp: , Rfl:  .  fluticasone (FLONASE) 50 MCG/ACT nasal spray, Place 2 sprays into both nostrils daily. (Patient taking differently: Place 2 sprays into both nostrils daily as needed for allergies. ), Disp: 48 g, Rfl: 1 .  Insulin Degludec-Liraglutide (XULTOPHY) 100-3.6 UNIT-MG/ML SOPN, Inject 28-50 Units into the skin daily., Disp: 27 mL, Rfl: 0 .  Insulin Glargine (LANTUS SOLOSTAR) 100 UNIT/ML Solostar Pen, Inject 28-50 Units into the skin daily., Disp: 15 mL, Rfl: 2 .  Insulin Pen Needle (PEN NEEDLES) 31G X 6 MM MISC, Use for insulin pen, Disp: 100 each, Rfl: 2 .  isosorbide mononitrate (IMDUR) 60 MG 24 hr tablet, TAKE 1 TABLET EVERY DAY, Disp: 90 tablet, Rfl: 1 .  lisinopril (PRINIVIL,ZESTRIL) 40 MG tablet, Take 1 tablet (40 mg total) by mouth daily., Disp: 30 tablet, Rfl: 4 .  metFORMIN (GLUCOPHAGE-XR) 750 MG 24 hr tablet, Take 2 tablets (1,500 mg total) by mouth daily with breakfast., Disp: 180 tablet, Rfl: 0 .  metoprolol succinate (TOPROL-XL) 50  MG 24 hr tablet, Take 1 tablet (50 mg total) by mouth daily., Disp: 90 tablet, Rfl: 3 .  pantoprazole (PROTONIX) 40 MG tablet, TAKE 1 TABLET EVERY MORNING, Disp: 90 tablet, Rfl: 1 .  rosuvastatin (CRESTOR) 20 MG tablet, TAKE 1 TABLET EVERY DAY, Disp: 90 tablet, Rfl: 3 .  triamcinolone cream (KENALOG) 0.1 %, Apply 1 application topically 2 (two) times daily., Disp: 30 g, Rfl: 0  Allergies  Allergen Reactions  . Codeine Other (See Comments)    Unknown reaction  . Contrast Media [Iodinated Diagnostic Agents] Itching  . Sulfa Antibiotics Itching     ROS  Ten systems reviewed and is negative except as  mentioned in HPI Chest pain resolved, seen by Dr. Clayborn Bigness and is on medical management   Objective  Vitals:   06/13/16 1340  BP: 118/68  Pulse: 79  Resp: 16  Temp: 97.9 F (36.6 C)  SpO2: 95%  Weight: 220 lb 8 oz (100 kg)  Height: '5\' 6"'  (1.676 m)    Body mass index is 35.59 kg/m.  Physical Exam  Constitutional: Patient appears well-developed and well-nourished. Obese  No distress.  HEENT: head atraumatic, normocephalic, pupils equal and reactive to light,  neck supple, throat within normal limits Cardiovascular: Normal rate, regular rhythm and normal heart sounds.  No murmur heard. No BLE edema. Pulmonary/Chest: Effort normal and breath sounds normal. No respiratory distress. Abdominal: Soft.  There is no tenderness. Psychiatric: Patient has a normal mood and affect. behavior is normal. Judgment and thought content normal.  Recent Results (from the past 2160 hour(s))  CBC with Differential     Status: Abnormal   Collection Time: 03/20/16 11:30 AM  Result Value Ref Range   WBC 7.8 3.6 - 11.0 K/uL   RBC 4.22 3.80 - 5.20 MIL/uL   Hemoglobin 12.1 12.0 - 16.0 g/dL   HCT 37.0 35.0 - 47.0 %   MCV 87.5 80.0 - 100.0 fL   MCH 28.6 26.0 - 34.0 pg   MCHC 32.7 32.0 - 36.0 g/dL   RDW 15.1 (H) 11.5 - 14.5 %   Platelets 327 150 - 440 K/uL   Neutrophils Relative % 48 %   Neutro Abs  3.7 1.4 - 6.5 K/uL   Lymphocytes Relative 41 %   Lymphs Abs 3.2 1.0 - 3.6 K/uL   Monocytes Relative 7 %   Monocytes Absolute 0.6 0.2 - 0.9 K/uL   Eosinophils Relative 3 %   Eosinophils Absolute 0.2 0 - 0.7 K/uL   Basophils Relative 1 %   Basophils Absolute 0.1 0 - 0.1 K/uL  Ferritin     Status: None   Collection Time: 03/20/16 11:30 AM  Result Value Ref Range   Ferritin 13 11 - 307 ng/mL  ANA,IFA Sjogrens' Pnl rflx Tit/Patn     Status: Abnormal   Collection Time: 04/26/16  4:57 PM  Result Value Ref Range   Rhuematoid fact SerPl-aCnc <10 <=14 IU/mL    Comment:                            Interpretive Table                     Low Positive: 15 - 41 IU/mL                     High Positive:  >= 42 IU/mL    In addition to the RF result, and clinical symptoms including joint  involvement, the 2010 ACR Classification Criteria for  scoring/diagnosing Rheumatoid Arthritis include the results of the  following tests:  CRP (47829), ESR (15010), and CCP ([facility]) (56213).  www.rheumatology.org/practice/clinical/classification/ra/ra_2010.asp    Anit Nuclear Antibody(ANA) POS (A) NEGATIVE   SSA (Ro) (ENA) Antibody, IgG <1.0 NEG <1.0 NEG AI   SSB (La) (ENA) Antibody, IgG <1.0 NEG <1.0 NEG AI  COMPLETE METABOLIC PANEL WITH GFR     Status: Abnormal   Collection Time: 04/26/16  4:57 PM  Result Value Ref Range   Sodium 136 135 - 146 mmol/L   Potassium 5.0 3.5 - 5.3 mmol/L   Chloride 102 98 - 110 mmol/L  CO2 20 20 - 31 mmol/L   Glucose, Bld 422 (H) 65 - 99 mg/dL    Comment: Result repeated and verified.   BUN 16 7 - 25 mg/dL   Creat 0.96 0.50 - 0.99 mg/dL    Comment:   For patients > or = 65 years of age: The upper reference limit for Creatinine is approximately 13% higher for people identified as African-American.      Total Bilirubin 0.2 0.2 - 1.2 mg/dL   Alkaline Phosphatase 55 33 - 130 U/L   AST 15 10 - 35 U/L   ALT 16 6 - 29 U/L   Total Protein 7.0 6.1 - 8.1 g/dL   Albumin 4.4 3.6  - 5.1 g/dL   Calcium 9.5 8.6 - 10.4 mg/dL   GFR, Est African American 72 >=60 mL/min   GFR, Est Non African American 63 >=60 mL/min  CK total and CKMB (cardiac)not at [facility]     Status: None   Collection Time: 04/26/16  4:57 PM  Result Value Ref Range   Total CK 62 7 - 177 U/L   CK, MB <0.7 0.0 - 5.0 ng/mL   Relative Index SEE NOTE 0.0 - 4.0    Comment: Relative Index not valid when CK is < 100   Result not calculated because one or more required values exceed analytical limits.     Sedimentation rate     Status: None   Collection Time: 04/26/16  4:57 PM  Result Value Ref Range   Sed Rate 2 0 - 30 mm/hr  C-reactive protein     Status: None   Collection Time: 04/26/16  4:57 PM  Result Value Ref Range   CRP <0.5 <0.60 mg/dL  Anti-nuclear ab-titer (ANA titer)     Status: Abnormal   Collection Time: 04/26/16  4:57 PM  Result Value Ref Range   ANA Pattern 1 HOMOGENEOUS (A)    ANA Titer 1 1:40 (H) titer    Comment:           Reference Range           < 1:40      Negative             1:40-1:80 Low Antibody level           > 1:80      Elevated Antibody level   Basic metabolic panel     Status: Abnormal   Collection Time: 04/29/16  4:41 PM  Result Value Ref Range   Sodium 137 135 - 145 mmol/L   Potassium 4.3 3.5 - 5.1 mmol/L   Chloride 106 101 - 111 mmol/L   CO2 20 (L) 22 - 32 mmol/L   Glucose, Bld 406 (H) 65 - 99 mg/dL   BUN 17 6 - 20 mg/dL   Creatinine, Ser 0.96 0.44 - 1.00 mg/dL   Calcium 9.7 8.9 - 10.3 mg/dL   GFR calc non Af Amer >60 >60 mL/min   GFR calc Af Amer >60 >60 mL/min    Comment: (NOTE) The eGFR has been calculated using the CKD EPI equation. This calculation has not been validated in all clinical situations. eGFR's persistently <60 mL/min signify possible Chronic Kidney Disease.    Anion gap 11 5 - 15  CBC     Status: Abnormal   Collection Time: 04/29/16  4:41 PM  Result Value Ref Range   WBC 6.5 3.6 - 11.0 K/uL   RBC 4.33 3.80 - 5.20 MIL/uL    Hemoglobin  12.9 12.0 - 16.0 g/dL   HCT 39.2 35.0 - 47.0 %   MCV 90.5 80.0 - 100.0 fL   MCH 29.9 26.0 - 34.0 pg   MCHC 33.0 32.0 - 36.0 g/dL   RDW 16.5 (H) 11.5 - 14.5 %   Platelets 270 150 - 440 K/uL  Urinalysis complete, with microscopic     Status: Abnormal   Collection Time: 04/29/16  4:41 PM  Result Value Ref Range   Color, Urine YELLOW (A) YELLOW   APPearance CLEAR (A) CLEAR   Glucose, UA >500 (A) NEGATIVE mg/dL   Bilirubin Urine NEGATIVE NEGATIVE   Ketones, ur TRACE (A) NEGATIVE mg/dL   Specific Gravity, Urine 1.017 1.005 - 1.030   Hgb urine dipstick NEGATIVE NEGATIVE   pH 5.0 5.0 - 8.0   Protein, ur NEGATIVE NEGATIVE mg/dL   Nitrite NEGATIVE NEGATIVE   Leukocytes, UA NEGATIVE NEGATIVE   RBC / HPF 0-5 0 - 5 RBC/hpf   WBC, UA 0-5 0 - 5 WBC/hpf   Bacteria, UA RARE (A) NONE SEEN   Squamous Epithelial / LPF 0-5 (A) NONE SEEN  Glucose, capillary     Status: Abnormal   Collection Time: 04/29/16  4:41 PM  Result Value Ref Range   Glucose-Capillary 400 (H) 65 - 99 mg/dL  Troponin I     Status: Abnormal   Collection Time: 04/29/16  4:41 PM  Result Value Ref Range   Troponin I 0.09 (HH) <0.03 ng/mL    Comment: CRITICAL RESULT CALLED TO, READ BACK BY AND VERIFIED WITH LAURIE ALLEN AT 1952 04/29/16.PMH  Glucose, capillary     Status: Abnormal   Collection Time: 04/29/16  6:06 PM  Result Value Ref Range   Glucose-Capillary 371 (H) 65 - 99 mg/dL  Glucose, capillary     Status: Abnormal   Collection Time: 04/29/16  7:59 PM  Result Value Ref Range   Glucose-Capillary 268 (H) 65 - 99 mg/dL  Glucose, capillary     Status: Abnormal   Collection Time: 04/29/16 10:10 PM  Result Value Ref Range   Glucose-Capillary 242 (H) 65 - 99 mg/dL  Glucose, capillary     Status: Abnormal   Collection Time: 04/29/16 11:09 PM  Result Value Ref Range   Glucose-Capillary 252 (H) 65 - 99 mg/dL  Troponin I-serum (0, 3, 6 hours)     Status: Abnormal   Collection Time: 04/29/16 11:28 PM  Result Value  Ref Range   Troponin I 0.06 (HH) <0.03 ng/mL    Comment: CRITICAL VALUE NOTED. VALUE IS CONSISTENT WITH PREVIOUSLY REPORTED/CALLED VALUE.PMH  Hemoglobin A1c     Status: Abnormal   Collection Time: 04/30/16  4:49 AM  Result Value Ref Range   Hgb A1c MFr Bld 8.3 (H) 4.0 - 6.0 %  Troponin I-serum (0, 3, 6 hours)     Status: None   Collection Time: 04/30/16  4:49 AM  Result Value Ref Range   Troponin I <0.03 <0.03 ng/mL  Lipid panel     Status: Abnormal   Collection Time: 04/30/16  4:49 AM  Result Value Ref Range   Cholesterol 65 0 - 200 mg/dL   Triglycerides 57 <150 mg/dL   HDL 28 (L) >40 mg/dL   Total CHOL/HDL Ratio 2.3 RATIO   VLDL 11 0 - 40 mg/dL   LDL Cholesterol 26 0 - 99 mg/dL    Comment:        Total Cholesterol/HDL:CHD Risk Coronary Heart Disease Risk Table  Men   Women  1/2 Average Risk   3.4   3.3  Average Risk       5.0   4.4  2 X Average Risk   9.6   7.1  3 X Average Risk  23.4   11.0        Use the calculated Patient Ratio above and the CHD Risk Table to determine the patient's CHD Risk.        ATP III CLASSIFICATION (LDL):  <100     mg/dL   Optimal  100-129  mg/dL   Near or Above                    Optimal  130-159  mg/dL   Borderline  160-189  mg/dL   High  >190     mg/dL   Very High   Glucose, capillary     Status: Abnormal   Collection Time: 04/30/16  7:25 AM  Result Value Ref Range   Glucose-Capillary 302 (H) 65 - 99 mg/dL   Comment 1 Notify RN    Comment 2 Document in Chart   Echocardiogram     Status: None   Collection Time: 04/30/16  8:23 AM  Result Value Ref Range   Weight 3,507.2 oz   Height 66 in   BP 157/85 mmHg  Glucose, capillary     Status: Abnormal   Collection Time: 04/30/16 11:12 AM  Result Value Ref Range   Glucose-Capillary 281 (H) 65 - 99 mg/dL   Comment 1 Notify RN    Comment 2 Document in Chart   Glucose, capillary     Status: Abnormal   Collection Time: 04/30/16  4:29 PM  Result Value Ref Range    Glucose-Capillary 381 (H) 65 - 99 mg/dL   Comment 1 Notify RN    Comment 2 Document in Chart   Glucose, capillary     Status: Abnormal   Collection Time: 04/30/16  9:15 PM  Result Value Ref Range   Glucose-Capillary 134 (H) 65 - 99 mg/dL  Basic metabolic panel     Status: Abnormal   Collection Time: 05/01/16  4:30 AM  Result Value Ref Range   Sodium 136 135 - 145 mmol/L   Potassium 3.6 3.5 - 5.1 mmol/L   Chloride 104 101 - 111 mmol/L   CO2 24 22 - 32 mmol/L   Glucose, Bld 322 (H) 65 - 99 mg/dL   BUN 19 6 - 20 mg/dL   Creatinine, Ser 0.66 0.44 - 1.00 mg/dL   Calcium 9.1 8.9 - 10.3 mg/dL   GFR calc non Af Amer >60 >60 mL/min   GFR calc Af Amer >60 >60 mL/min    Comment: (NOTE) The eGFR has been calculated using the CKD EPI equation. This calculation has not been validated in all clinical situations. eGFR's persistently <60 mL/min signify possible Chronic Kidney Disease.    Anion gap 8 5 - 15  Glucose, capillary     Status: Abnormal   Collection Time: 05/01/16  7:50 AM  Result Value Ref Range   Glucose-Capillary 326 (H) 65 - 99 mg/dL  NM Myocar Multi W/Spect W/Wall Motion / EF     Status: None   Collection Time: 05/01/16 12:09 PM  Result Value Ref Range   Rest HR 100 bpm   Rest BP 119/68 mmHg   Peak HR 117 bpm   Peak BP 130/61 mmHg   SSS 1    SRS 2    SDS  2    TID 0.88    LV sys vol 36 mL   LV dias vol 59 46 - 106 mL  Glucose, capillary     Status: Abnormal   Collection Time: 05/01/16 12:23 PM  Result Value Ref Range   Glucose-Capillary 215 (H) 65 - 99 mg/dL  Glucose, capillary     Status: Abnormal   Collection Time: 05/01/16  5:04 PM  Result Value Ref Range   Glucose-Capillary 348 (H) 65 - 99 mg/dL  Glucose, capillary     Status: Abnormal   Collection Time: 05/01/16  9:19 PM  Result Value Ref Range   Glucose-Capillary 254 (H) 65 - 99 mg/dL  Basic metabolic panel     Status: Abnormal   Collection Time: 05/02/16  3:46 AM  Result Value Ref Range   Sodium 137 135  - 145 mmol/L   Potassium 3.7 3.5 - 5.1 mmol/L   Chloride 104 101 - 111 mmol/L   CO2 25 22 - 32 mmol/L   Glucose, Bld 265 (H) 65 - 99 mg/dL   BUN 24 (H) 6 - 20 mg/dL   Creatinine, Ser 0.67 0.44 - 1.00 mg/dL   Calcium 9.2 8.9 - 10.3 mg/dL   GFR calc non Af Amer >60 >60 mL/min   GFR calc Af Amer >60 >60 mL/min    Comment: (NOTE) The eGFR has been calculated using the CKD EPI equation. This calculation has not been validated in all clinical situations. eGFR's persistently <60 mL/min signify possible Chronic Kidney Disease.    Anion gap 8 5 - 15  Glucose, capillary     Status: Abnormal   Collection Time: 05/02/16  7:36 AM  Result Value Ref Range   Glucose-Capillary 293 (H) 65 - 99 mg/dL  Glucose, capillary     Status: Abnormal   Collection Time: 05/02/16 11:19 AM  Result Value Ref Range   Glucose-Capillary 281 (H) 65 - 99 mg/dL  POCT HgB A1C     Status: None   Collection Time: 06/13/16  1:48 PM  Result Value Ref Range   Hemoglobin A1C 7.3     PHQ2/9: Depression screen [facility] 2/9 06/13/2016 05/11/2016 04/26/2016 03/13/2016 02/02/2016  Decreased Interest 0 1 0 0 0  Down, Depressed, Hopeless 0 0 0 0 0  PHQ - 2 Score 0 1 0 0 0     Fall Risk: Fall Risk  06/13/2016 05/11/2016 04/26/2016 03/13/2016 02/02/2016  Falls in the past year? No No No No No      Functional Status Survey: Is the patient deaf or have difficulty hearing?: No Does the patient have difficulty seeing, even when wearing glasses/contacts?: No Does the patient have difficulty concentrating, remembering, or making decisions?: No Does the patient have difficulty walking or climbing stairs?: No Does the patient have difficulty dressing or bathing?: No Does the patient have difficulty doing errands alone such as visiting a doctor's office or shopping?: No    Assessment & Plan  1. Uncontrolled type 2 diabetes mellitus with gastroparesis (HCC)  We will change medication to get bp at goal, she is feeling very hungry so we  discussed adding GLP-1 to help curbing appetite. Return in one month for follow up on everything. She decides personal history of pancreatitis or family history of thyroid carcinoma history in her family or other endocrine tumors  - POCT HgB A1C 7.3%  2. Need for influenza vaccination  - Flu Vaccine QUAD 36+ mos PF IM (Fluarix & Fluzone Quad PF)  3. Uncontrolled type  2 diabetes mellitus with microalbuminuria, with long-term current use of insulin (HCC)  - metFORMIN (GLUCOPHAGE-XR) 750 MG 24 hr tablet; Take 2 tablets (1,500 mg total) by mouth daily with breakfast.  Dispense: 180 tablet; Refill: 0 - Insulin Degludec-Liraglutide (XULTOPHY) 100-3.6 UNIT-MG/ML SOPN; Inject 28-50 Units into the skin daily.  Dispense: 27 mL; Refill: 0

## 2016-06-13 NOTE — Patient Instructions (Signed)
We will stop Janumet and Lantus Starting Metformin and Xultophy

## 2016-06-14 ENCOUNTER — Telehealth: Payer: Self-pay

## 2016-06-14 DIAGNOSIS — H2512 Age-related nuclear cataract, left eye: Secondary | ICD-10-CM | POA: Diagnosis not present

## 2016-06-14 NOTE — Telephone Encounter (Signed)
Patient is in the [facility] and states she is going to use Medication Management due to cost. Metformin and Lantus is the cheapest cost medication for her, states the Xultophy is 1,000. Patient states Medication Assistant will be getting the information to Dr. Ancil Boozer soon.

## 2016-06-15 ENCOUNTER — Other Ambulatory Visit: Payer: Self-pay | Admitting: Family Medicine

## 2016-06-15 MED ORDER — SITAGLIPTIN PHOS-METFORMIN HCL 50-1000 MG PO TABS: 1.0000 | ORAL_TABLET | Freq: Two times a day (BID) | ORAL | 0 refills | Status: DC

## 2016-06-16 ENCOUNTER — Telehealth: Payer: Self-pay | Admitting: Family Medicine

## 2016-06-16 NOTE — Telephone Encounter (Signed)
-----   Message from Vonna Kotyk, [place] sent at 06/16/2016 11:01 AM EDT ----- Regarding: FW: Medication Assistance  Contact: [phone] Can we get Lantus samples? I have faxed over the prescription for Janumet to Medication Management.  ----- Message ----- From: Steele Sizer, MD Sent: 06/15/2016   8:54 PM To: Vonna Kotyk, CMA Subject: FW: Medication Assistance                      I printed rx for Janumet, she will still need insulin. Can we get her samples of Lantus?  Please notify Ellie Lunch about it.  Thank you ----- Message ----- From: Cammy Copa, [facility] Sent: 06/15/2016   4:12 PM To: Steele Sizer, MD Subject: Medication Assistance                          Dr. Ancil Boozer, Dana Sparks is in the [facility] Coverage Gap. [facility] is trying to order medications for her through patient assistance. Most of the injectable medications (insulin, GLP1's) require the patient to be in the coverage gap and spend $1000-$1500 out of pocket.  [name] has spent about $400 out of pocket. We are not able to order the Xultophy until she spends about $600 more out of pocket.  I see that she was on Januvia. We have samples of Janumet 50/1000mg  that we could use for her to bridge until her Medicare benefit starts over in January. If this is appropriate, please fax a new Rx to [facility]. [phone] or e-scribe [facility]. Also, we have samples of Novolin N for her to use until January 1 if this is appropriate, please fax a new Rx.  Thank you.  Keri K. Dicky Doe, PharmD [facility] [place] Operations Coordinator [phone]

## 2016-06-16 NOTE — Telephone Encounter (Signed)
Have called the Drug Rep Fara Boros @ [phone] and lft message to call about needing Lantus Samples for patient.

## 2016-06-19 ENCOUNTER — Other Ambulatory Visit: Payer: Self-pay | Admitting: *Deleted

## 2016-06-19 DIAGNOSIS — D508 Other iron deficiency anemias: Secondary | ICD-10-CM

## 2016-06-20 ENCOUNTER — Inpatient Hospital Stay (HOSPITAL_BASED_OUTPATIENT_CLINIC_OR_DEPARTMENT_OTHER): Payer: Commercial Managed Care - HMO | Admitting: Hematology and Oncology

## 2016-06-20 ENCOUNTER — Telehealth: Payer: Self-pay | Admitting: *Deleted

## 2016-06-20 ENCOUNTER — Inpatient Hospital Stay: Payer: Commercial Managed Care - HMO | Attending: Hematology and Oncology

## 2016-06-20 VITALS — BP 128/75 | HR 66 | Temp 94.6°F | Resp 18 | Wt 218.5 lb

## 2016-06-20 DIAGNOSIS — Z7982 Long term (current) use of aspirin: Secondary | ICD-10-CM

## 2016-06-20 DIAGNOSIS — Z79899 Other long term (current) drug therapy: Secondary | ICD-10-CM | POA: Diagnosis not present

## 2016-06-20 DIAGNOSIS — I2510 Atherosclerotic heart disease of native coronary artery without angina pectoris: Secondary | ICD-10-CM | POA: Insufficient documentation

## 2016-06-20 DIAGNOSIS — E119 Type 2 diabetes mellitus without complications: Secondary | ICD-10-CM | POA: Insufficient documentation

## 2016-06-20 DIAGNOSIS — I10 Essential (primary) hypertension: Secondary | ICD-10-CM | POA: Insufficient documentation

## 2016-06-20 DIAGNOSIS — N289 Disorder of kidney and ureter, unspecified: Secondary | ICD-10-CM | POA: Diagnosis not present

## 2016-06-20 DIAGNOSIS — Z87891 Personal history of nicotine dependence: Secondary | ICD-10-CM | POA: Diagnosis not present

## 2016-06-20 DIAGNOSIS — M129 Arthropathy, unspecified: Secondary | ICD-10-CM

## 2016-06-20 DIAGNOSIS — K5790 Diverticulosis of intestine, part unspecified, without perforation or abscess without bleeding: Secondary | ICD-10-CM | POA: Diagnosis not present

## 2016-06-20 DIAGNOSIS — Z794 Long term (current) use of insulin: Secondary | ICD-10-CM

## 2016-06-20 DIAGNOSIS — D509 Iron deficiency anemia, unspecified: Secondary | ICD-10-CM | POA: Diagnosis not present

## 2016-06-20 DIAGNOSIS — D126 Benign neoplasm of colon, unspecified: Secondary | ICD-10-CM | POA: Insufficient documentation

## 2016-06-20 DIAGNOSIS — M109 Gout, unspecified: Secondary | ICD-10-CM

## 2016-06-20 DIAGNOSIS — E785 Hyperlipidemia, unspecified: Secondary | ICD-10-CM | POA: Diagnosis not present

## 2016-06-20 DIAGNOSIS — G4730 Sleep apnea, unspecified: Secondary | ICD-10-CM

## 2016-06-20 DIAGNOSIS — J449 Chronic obstructive pulmonary disease, unspecified: Secondary | ICD-10-CM | POA: Insufficient documentation

## 2016-06-20 DIAGNOSIS — F329 Major depressive disorder, single episode, unspecified: Secondary | ICD-10-CM | POA: Diagnosis not present

## 2016-06-20 DIAGNOSIS — Z809 Family history of malignant neoplasm, unspecified: Secondary | ICD-10-CM | POA: Diagnosis not present

## 2016-06-20 DIAGNOSIS — D473 Essential (hemorrhagic) thrombocythemia: Secondary | ICD-10-CM

## 2016-06-20 DIAGNOSIS — K3184 Gastroparesis: Secondary | ICD-10-CM

## 2016-06-20 DIAGNOSIS — F419 Anxiety disorder, unspecified: Secondary | ICD-10-CM

## 2016-06-20 DIAGNOSIS — D508 Other iron deficiency anemias: Secondary | ICD-10-CM

## 2016-06-20 LAB — CBC WITH DIFFERENTIAL/PLATELET
Basophils Absolute: 0 10*3/uL (ref 0–0.1)
Basophils Relative: 1 %
Eosinophils Absolute: 0.2 10*3/uL (ref 0–0.7)
Eosinophils Relative: 3 %
HCT: 40.9 % (ref 35.0–47.0)
Hemoglobin: 13.6 g/dL (ref 12.0–16.0)
Lymphocytes Relative: 44 %
Lymphs Abs: 3 10*3/uL (ref 1.0–3.6)
MCH: 29.8 pg (ref 26.0–34.0)
MCHC: 33.3 g/dL (ref 32.0–36.0)
MCV: 89.6 fL (ref 80.0–100.0)
Monocytes Absolute: 0.5 10*3/uL (ref 0.2–0.9)
Monocytes Relative: 8 %
Neutro Abs: 2.9 10*3/uL (ref 1.4–6.5)
Neutrophils Relative %: 44 %
Platelets: 288 10*3/uL (ref 150–440)
RBC: 4.57 MIL/uL (ref 3.80–5.20)
RDW: 15.9 % — ABNORMAL HIGH (ref 11.5–14.5)
WBC: 6.6 10*3/uL (ref 3.6–11.0)

## 2016-06-20 LAB — FERRITIN: Ferritin: 13 ng/mL (ref 11–307)

## 2016-06-20 NOTE — Telephone Encounter (Signed)
Called pt at home and no answer, tried  cell phone and got voicemail and let her know that ferritin was 13 and it was same as 3 months ago and she should cont. Her iron pills and if she has questions she could call back to office

## 2016-06-20 NOTE — Progress Notes (Signed)
[facility] day:  06/20/16   Chief Complaint: Dana Sparks is a 65 y.o. female with iron deficiency anemia and reactive thrombocytosis who is seen for 3 month assessment.  HPI: The patient was last seen in the medical oncology clinic on 03/21/2016.  At that time, her ice pica had improved.  She was taking oral iron. Hematocrit had improved from 34.5 to 37.0.  Ferritin had increased from 6 to 13.  Symptomatically, she feels good.  She has had no ice pica.  She notes that she was switched to Lantus for her diabetes.  She has a follow-up visit with Dr. Ancil Boozer in 1 month.  She is taking her oral iron 2 times a day.   Past Medical History:  Diagnosis Date  . Allergy   . Anxiety   . Arthritis   . CAD (coronary artery disease)   . COPD (chronic obstructive pulmonary disease) ([place])   . Depression   . Diabetes mellitus without complication ([place])   . Diverticulitis   . Gastroparesis   . Gout   . Hyperlipemia   . Hypertension   . Positive H. pylori test   . Renal insufficiency   . Sleep apnea   . Thrombocytosis ([place]) 01/28/2015  . Tubular adenoma of colon 01/20/14    Past Surgical History:  Procedure Laterality Date  . ABDOMINAL HYSTERECTOMY     total  . CATARACT EXTRACTION    . COLONOSCOPY  01/2014   sigmoid diverticulosis. desc colon TA, hyperplastic polyp  . CORONARY ANGIOPLASTY WITH STENT PLACEMENT    . ESOPHAGOGASTRODUODENOSCOPY N/A 02/16/2015   negative h pylori, focal gastic intestinal metaplasia  . TOTAL KNEE ARTHROPLASTY Right     Family History  Problem Relation Age of Onset  . Cancer Sister     unsure  . Alcohol abuse Brother   . Hypertension Mother   . Diabetes Mother   . Heart disease Mother   . Diabetes Father   . Heart disease Father   . Hypertension Father   . Colon cancer Neg Hx   . Liver disease Neg Hx   . Breast cancer Neg Hx     Social History:  reports that she quit smoking about 3 years ago. Her smoking  use included Cigarettes. She has a 40.00 pack-year smoking history. She has never used smokeless tobacco. She reports that she does not drink alcohol or use drugs.  The patient is alone today.  Allergies:  Allergies  Allergen Reactions  . Codeine Other (See Comments)    Unknown reaction  . Contrast Media [Iodinated Diagnostic Agents] Itching  . Sulfa Antibiotics Itching    Current Medications: Current Outpatient Prescriptions  Medication Sig Dispense Refill  . ACCU-CHEK FASTCLIX LANCETS MISC     . ACCU-CHEK SMARTVIEW test strip     . Alcohol Swabs (B-D SINGLE USE SWABS REGULAR) PADS 1 each by Does not apply route 4 (four) times daily. 200 each 2  . allopurinol (ZYLOPRIM) 100 MG tablet Take 1 tablet (100 mg total) by mouth daily. 90 tablet 3  . alum & mag hydroxide-simeth (MAALOX/MYLANTA) 200-200-20 MG/5ML suspension Take 30 mLs by mouth every 6 (six) hours as needed for indigestion or heartburn. 355 mL 0  . amLODipine (NORVASC) 5 MG tablet TAKE 1 TABLET EVERY DAY 90 tablet 1  . aspirin EC 81 MG tablet Take 81 mg by mouth every morning.     . blood glucose meter kit and supplies KIT Dispense  based on patient and insurance preference. Use up to four times daily as directed. (FOR ICD-9 250.00, 250.01). 1 each 0  . citalopram (CELEXA) 20 MG tablet Take 1 tablet (20 mg total) by mouth daily. 90 tablet 3  . Coenzyme Q10 (COQ10) 100 MG CAPS Take 1 capsule by mouth daily. 90 each 1  . Ferrous Sulfate (IRON) 325 (65 Fe) MG TABS Take by mouth. Petra Kuba made brand-  Pt takes one tablet twice a day    . fexofenadine (ALLEGRA) 180 MG tablet Take 180 mg by mouth daily as needed for allergies.     . fluticasone (FLONASE) 50 MCG/ACT nasal spray Place 2 sprays into both nostrils daily. (Patient taking differently: Place 2 sprays into both nostrils daily as needed for allergies. ) 48 g 1  . Insulin Glargine (LANTUS SOLOSTAR) 100 UNIT/ML Solostar Pen Inject 28-50 Units into the skin daily. 15 mL 2  . Insulin  Pen Needle (PEN NEEDLES) 31G X 6 MM MISC Use for insulin pen 100 each 2  . isosorbide mononitrate (IMDUR) 60 MG 24 hr tablet TAKE 1 TABLET EVERY DAY 90 tablet 1  . lisinopril (PRINIVIL,ZESTRIL) 40 MG tablet Take 1 tablet (40 mg total) by mouth daily. 30 tablet 4  . metoprolol succinate (TOPROL-XL) 50 MG 24 hr tablet Take 1 tablet (50 mg total) by mouth daily. 90 tablet 3  . pantoprazole (PROTONIX) 40 MG tablet TAKE 1 TABLET EVERY MORNING 90 tablet 1  . rosuvastatin (CRESTOR) 20 MG tablet TAKE 1 TABLET EVERY DAY 90 tablet 3  . sitaGLIPtin-metformin (JANUMET) 50-1000 MG tablet Take 1 tablet by mouth 2 (two) times daily with a meal. 90 tablet 0  . triamcinolone cream (KENALOG) 0.1 % Apply 1 application topically 2 (two) times daily. 30 g 0   No current facility-administered medications for this visit.     Review of Systems:  GENERAL:  Feels good.  No fevers or sweats.  Weight down 5 pounds. PERFORMANCE STATUS (ECOG):  1 HEENT:  No visual changes, runny nose, sore throat, mouth sores or tenderness. Lungs: No shortness of breath or cough.  No hemoptysis. Cardiac:  No chest pain, palpitations, orthopnea, or PND. GI:  Ice pica, resolved.  No nausea, vomiting, diarrhea, constipation, melena or hematochezia. GU:  No urgency, frequency, dysuria, or hematuria. Musculoskeletal:  No back pain.  No joint pain.  No muscle tenderness. Extremities:  No pain or swelling. Skin:  No rashes or skin changes. Neuro:  No headache, numbness or weakness, balance or coordination issues. Endocrine:  Diabetes.  No thyroid issues, hot flashes or night sweats. Psych:  No mood changes, depression or anxiety. Pain:  No focal pain. Review of systems:  All other systems reviewed and found to be negative.  Physical Exam: Blood pressure 128/75, pulse 66, temperature (!) 94.6 F (34.8 C), temperature source Tympanic, resp. rate 18, weight 218 lb 7.6 oz (99.1 kg). GENERAL:  Well developed, well nourished, sitting  comfortably in the exam room in no acute distress. MENTAL STATUS:  Alert and oriented to person, place and time. HEAD:  Long curly black hair with graying.  Normocephalic, atraumatic, face symmetric, no Cushingoid features. EYES:  Glasses.  Brown eyes.  Pupils equal round and reactive to light and accomodation.  No conjunctivitis or scleral icterus. ENT:  Oropharynx clear without lesion.  Tongue normal. Mucous membranes moist.  RESPIRATORY:  Clear to auscultation without rales, wheezes or rhonchi. CARDIOVASCULAR:  Regular rate and rhythm without murmur, rub or gallop. ABDOMEN:  Soft,  non-tender, with active bowel sounds, and no appreciable hepatosplenomegaly.  No masses. SKIN:  No rashes, ulcers or lesions. EXTREMITIES: No edema, no skin discoloration or tenderness.  No palpable cords. LYMPH NODES: No palpable cervical, supraclavicular, axillary or inguinal adenopathy  NEUROLOGICAL: Unremarkable. PSYCH:  Appropriate.   Appointment on 06/20/2016  Component Date Value Ref Range Status  . WBC 06/20/2016 6.6  3.6 - 11.0 K/uL Final  . RBC 06/20/2016 4.57  3.80 - 5.20 MIL/uL Final  . Hemoglobin 06/20/2016 13.6  12.0 - 16.0 g/dL Final  . HCT 06/20/2016 40.9  35.0 - 47.0 % Final  . MCV 06/20/2016 89.6  80.0 - 100.0 fL Final  . MCH 06/20/2016 29.8  26.0 - 34.0 pg Final  . MCHC 06/20/2016 33.3  32.0 - 36.0 g/dL Final  . RDW 06/20/2016 15.9* 11.5 - 14.5 % Final  . Platelets 06/20/2016 288  150 - 440 K/uL Final  . Neutrophils Relative % 06/20/2016 44  % Final  . Neutro Abs 06/20/2016 2.9  1.4 - 6.5 K/uL Final  . Lymphocytes Relative 06/20/2016 44  % Final  . Lymphs Abs 06/20/2016 3.0  1.0 - 3.6 K/uL Final  . Monocytes Relative 06/20/2016 8  % Final  . Monocytes Absolute 06/20/2016 0.5  0.2 - 0.9 K/uL Final  . Eosinophils Relative 06/20/2016 3  % Final  . Eosinophils Absolute 06/20/2016 0.2  0 - 0.7 K/uL Final  . Basophils Relative 06/20/2016 1  % Final  . Basophils Absolute 06/20/2016 0.0  0 -  0.1 K/uL Final    Assessment:  ADONAI HELZER is a 65 y.o. female with iron deficiency anemia and reactive thrombocytosis.  She had positive H pylori serologies and breath test.  She completed an antibiotics course.   Her diet is good.  She denies any GI bleeding (melena or hematochezia), vaginal or GU bleeding.   Colonoscopy on 01/20/2014 and revealed a 6 mm polyp in the descending colon, a 5 mm polyp in the sigmoid colon, and diverticulosis.  Pathology was benign.  EGD on 02/16/2015 was normal.  Pathology revealed no active inflammation, dysplasia or malignancy.  Work-up on 12/22/2014 confirmed iron deficiency.  Labs included a hematocrit 29.5, hemoglobin 8.8, MCV 74, platelets 428,000 and white count 6800 with an ANC of 3332. Ferritin was 4 (low) with a TIBC of 518 (elevated). Iron saturation was 3%. Folate was 23.3. Guaiac cards were negative.   Diet is good.  She stopped taking oral iron for 1 month.  Hematocrit dropped from 38.5 to 34.5.  Ferritin dropped from 35 to 6.  She has been back on oral iron x 5 months.  Guaiac cards x 3 were negative (02/2016).  Symptomatically, she feels good.  Ice pica has resolved.  She denies any melena or hematochezia.  Exam in unremarkable.  Hematocrit has improved from 34.5 to 37.0 to 40.9 in the past 5 months.  Ferritin has increased from 6 to 13.  Plan: 1.  Labs today:  CBC witth diff, ferritin. 2.  Continue oral iron BID with OJ or vitamin C. Ferritin goal 100. 3.  Once ferritin is adequate and near goal, discontinue oral iron.  Check labs (CBC, ferritin) with Dr. Ancil Boozer every 2-3 months. 4.  Discuss plan to follow-up with GI if hematocrit drops after oral iron discontinued. 5.  RTC prn.   Lequita Asal, MD  06/20/2016, 10:39 AM

## 2016-06-20 NOTE — Telephone Encounter (Signed)
-----   Message from Lequita Asal, MD sent at 06/20/2016 12:04 PM EDT ----- Regarding: Please notifty patient  Ferritin level  ----- Message ----- From: Interface, [facility] In [place] Sent: 06/20/2016  10:22 AM To: Lequita Asal, MD

## 2016-06-20 NOTE — Progress Notes (Signed)
Patient offers no complaints today. 

## 2016-06-22 ENCOUNTER — Encounter: Payer: Self-pay | Admitting: Hematology and Oncology

## 2016-07-04 ENCOUNTER — Telehealth: Payer: Self-pay | Admitting: Family Medicine

## 2016-07-04 NOTE — Telephone Encounter (Signed)
Dana Sparks from [facility] states she had faxed paperwork intially in September then she re-faxed it over on 06/30/16 (she spoke with tiffany). It is a home health order for medication change. Please return her call

## 2016-07-07 ENCOUNTER — Encounter: Payer: Self-pay | Admitting: Family Medicine

## 2016-07-13 ENCOUNTER — Telehealth: Payer: Self-pay | Admitting: Family Medicine

## 2016-07-13 NOTE — Telephone Encounter (Signed)
Patient is now in the [facility] and is asking that you please send all her medications to [phone]

## 2016-07-14 ENCOUNTER — Other Ambulatory Visit: Payer: Self-pay

## 2016-07-14 DIAGNOSIS — Z794 Long term (current) use of insulin: Principal | ICD-10-CM

## 2016-07-14 DIAGNOSIS — E0822 Diabetes mellitus due to underlying condition with diabetic chronic kidney disease: Secondary | ICD-10-CM

## 2016-07-14 DIAGNOSIS — E0865 Diabetes mellitus due to underlying condition with hyperglycemia: Principal | ICD-10-CM

## 2016-07-14 DIAGNOSIS — N183 Chronic kidney disease, stage 3 (moderate): Principal | ICD-10-CM

## 2016-07-14 DIAGNOSIS — M109 Gout, unspecified: Secondary | ICD-10-CM

## 2016-07-14 DIAGNOSIS — IMO0002 Reserved for concepts with insufficient information to code with codable children: Secondary | ICD-10-CM

## 2016-07-14 NOTE — Telephone Encounter (Signed)
Patient called and states she needs all her medication to go to [facility] due to her being in [place]. Patient will use [facility] until next year. Called [facility] and they stated all their prescriptions have expired and can be sent electronically. Please fax a new prescription for medication to them. Thanks

## 2016-07-15 NOTE — Telephone Encounter (Signed)
I would prefer if she contact the assistance program first to find out what drugs they will cover, usually they only pay for branded medication.

## 2016-07-17 ENCOUNTER — Ambulatory Visit (INDEPENDENT_AMBULATORY_CARE_PROVIDER_SITE_OTHER): Payer: Commercial Managed Care - HMO | Admitting: Family Medicine

## 2016-07-17 ENCOUNTER — Encounter: Payer: Self-pay | Admitting: Family Medicine

## 2016-07-17 VITALS — BP 128/78 | HR 82 | Temp 97.4°F | Resp 16 | Ht 66.0 in | Wt 222.6 lb

## 2016-07-17 DIAGNOSIS — E1143 Type 2 diabetes mellitus with diabetic autonomic (poly)neuropathy: Secondary | ICD-10-CM | POA: Diagnosis not present

## 2016-07-17 DIAGNOSIS — I209 Angina pectoris, unspecified: Secondary | ICD-10-CM | POA: Diagnosis not present

## 2016-07-17 DIAGNOSIS — IMO0002 Reserved for concepts with insufficient information to code with codable children: Secondary | ICD-10-CM

## 2016-07-17 DIAGNOSIS — J3089 Other allergic rhinitis: Secondary | ICD-10-CM

## 2016-07-17 DIAGNOSIS — Z794 Long term (current) use of insulin: Secondary | ICD-10-CM

## 2016-07-17 DIAGNOSIS — G4733 Obstructive sleep apnea (adult) (pediatric): Secondary | ICD-10-CM | POA: Diagnosis not present

## 2016-07-17 DIAGNOSIS — E1165 Type 2 diabetes mellitus with hyperglycemia: Secondary | ICD-10-CM

## 2016-07-17 DIAGNOSIS — I10 Essential (primary) hypertension: Secondary | ICD-10-CM

## 2016-07-17 DIAGNOSIS — M109 Gout, unspecified: Secondary | ICD-10-CM | POA: Diagnosis not present

## 2016-07-17 DIAGNOSIS — K219 Gastro-esophageal reflux disease without esophagitis: Secondary | ICD-10-CM

## 2016-07-17 DIAGNOSIS — E1129 Type 2 diabetes mellitus with other diabetic kidney complication: Secondary | ICD-10-CM

## 2016-07-17 DIAGNOSIS — D473 Essential (hemorrhagic) thrombocythemia: Secondary | ICD-10-CM

## 2016-07-17 DIAGNOSIS — F330 Major depressive disorder, recurrent, mild: Secondary | ICD-10-CM

## 2016-07-17 DIAGNOSIS — D508 Other iron deficiency anemias: Secondary | ICD-10-CM

## 2016-07-17 DIAGNOSIS — L851 Acquired keratosis [keratoderma] palmaris et plantaris: Secondary | ICD-10-CM | POA: Diagnosis not present

## 2016-07-17 DIAGNOSIS — D75839 Thrombocytosis, unspecified: Secondary | ICD-10-CM

## 2016-07-17 DIAGNOSIS — B351 Tinea unguium: Secondary | ICD-10-CM | POA: Diagnosis not present

## 2016-07-17 DIAGNOSIS — E1142 Type 2 diabetes mellitus with diabetic polyneuropathy: Secondary | ICD-10-CM | POA: Diagnosis not present

## 2016-07-17 DIAGNOSIS — R809 Proteinuria, unspecified: Secondary | ICD-10-CM

## 2016-07-17 DIAGNOSIS — E785 Hyperlipidemia, unspecified: Secondary | ICD-10-CM

## 2016-07-17 MED ORDER — SITAGLIPTIN PHOS-METFORMIN HCL 50-1000 MG PO TABS: 1.0000 | ORAL_TABLET | Freq: Two times a day (BID) | ORAL | 0 refills | Status: DC

## 2016-07-17 MED ORDER — CITALOPRAM HYDROBROMIDE 20 MG PO TABS: 20.0000 mg | ORAL_TABLET | Freq: Every day | ORAL | 0 refills | Status: DC

## 2016-07-17 MED ORDER — PEN NEEDLES 31G X 6 MM MISC: 2 refills | Status: DC

## 2016-07-17 MED ORDER — INSULIN GLARGINE 100 UNIT/ML SOLOSTAR PEN: 28.0000 [IU] | PEN_INJECTOR | Freq: Every day | SUBCUTANEOUS | 0 refills | Status: DC

## 2016-07-17 MED ORDER — AMLODIPINE BESYLATE 5 MG PO TABS: 5.0000 mg | ORAL_TABLET | Freq: Every day | ORAL | 0 refills | Status: DC

## 2016-07-17 MED ORDER — FLUTICASONE PROPIONATE 50 MCG/ACT NA SUSP: 2.0000 | Freq: Every day | NASAL | 0 refills | Status: DC

## 2016-07-17 MED ORDER — ROSUVASTATIN CALCIUM 20 MG PO TABS: 20.0000 mg | ORAL_TABLET | Freq: Every day | ORAL | 0 refills | Status: DC

## 2016-07-17 MED ORDER — LISINOPRIL 40 MG PO TABS: 40.0000 mg | ORAL_TABLET | Freq: Every day | ORAL | 4 refills | Status: DC

## 2016-07-17 MED ORDER — PANTOPRAZOLE SODIUM 40 MG PO TBEC: 40.0000 mg | DELAYED_RELEASE_TABLET | Freq: Every morning | ORAL | 0 refills | Status: DC

## 2016-07-17 MED ORDER — ALUM & MAG HYDROXIDE-SIMETH 200-200-20 MG/5ML PO SUSP: 30.0000 mL | Freq: Four times a day (QID) | ORAL | 0 refills | Status: DC | PRN

## 2016-07-17 MED ORDER — ISOSORBIDE MONONITRATE ER 60 MG PO TB24: 60.0000 mg | ORAL_TABLET | Freq: Every day | ORAL | 0 refills | Status: DC

## 2016-07-17 MED ORDER — METOPROLOL SUCCINATE ER 50 MG PO TB24: 50.0000 mg | ORAL_TABLET | Freq: Every day | ORAL | 3 refills | Status: DC

## 2016-07-17 MED ORDER — ALLOPURINOL 100 MG PO TABS: 100.0000 mg | ORAL_TABLET | Freq: Every day | ORAL | 0 refills | Status: DC

## 2016-07-17 NOTE — Telephone Encounter (Signed)
Patient has an appt today and will have all meds needed swithed to [facility].

## 2016-07-17 NOTE — Progress Notes (Signed)
Name: Dana Sparks   MRN: [id number]    DOB: November 28, 1950   Date:07/17/2016       Progress Note  Subjective  Chief Complaint  Chief Complaint  Patient presents with  . Diabetes  . Obesity  . Hypertension  . Depression    HPI  Anemia: she was diagnosed earlier 2016 with h. Pylori, she was having a lot of dyspepsia. We tried treating her with antibiotics but she was unable to tolerate it. She was referred to Dr. Durwin Reges and Oncologist because of severe anemia. EGD was neg, she is taking ferrous sulfate twice  daily and she states she has been compliant. No longer has epigastric pain. Dr. Mike Gip has recommended labs every 3 months, 3 hemoccult cards negative in  2017. She was seen recently by Dr. Mike Gip and iron storage has improved  DMII with gastroparesis/CKI: she is taking medication as prescribed, has not been checking glucose at home. Her hgbA1C is  Still elevated - last hgbA1C 7.3 % she is still eats sweets but not as often, and is not exercising.  She denies hypoglycemia, polydipsia, or polyuria. She has episodes of polyphagia She is on Ace for diabetic CKI, urine micro is up to 50  states gastroparesis symptoms are controlled - not on medication. Frozen shoulder on right side, improved with home PT. FSBS at home over the past month highest of 167 fasting  and lowest of  109 fasting. Highest post-prandially was 200  HTN: at goal, no side effects of medication. No chest pain or palpitation.   Angina: CAD, her cardiologist is Dr. Clayborn Bigness, taking Imdur daily and Toprol XL and has not chest pain at this time. She is also on statin therapy, aspirin  and ace.  OSA: she is  using a CPAP machine every night, all night long.   Major Depression Mild: she denies crying spells. She has some fatigue, also anhedonia, she is taking antidepressants - Citalopram, and states symptoms are mild and does not want to change medication. She is not intimate with her husband - they don't sleep in the  same room. She states she does not care. It started 2 years ago. She states they have a good relationship - not having sex is a mutual agreement   Menopause: she has hot flashes, she has severe vaginal dryness, not sexually active ( husband has ED ) , she states she can't afford medication for vaginal dryness.  Morbid obesity: she gained a couple more  lbs since last visit, she is not sure why. She still has sweets, but avoiding regular sodas or sweet tea. Explained importance of dietary modification and regular exercise  Patient Active Problem List   Diagnosis Date Noted  . Elevated antinuclear antibody (ANA) level 04/27/2016  . Angina pectoris ([place]) 11/01/2015  . Well controlled type 2 diabetes mellitus with gastroparesis ([place]) 06/22/2015  . Low TSH level 06/22/2015  . Iron deficiency anemia 04/07/2015  . Intestinal metaplasia of gastric mucosa 03/11/2015  . CAD (coronary artery disease), native coronary artery 02/24/2015  . History of coronary artery stent placement 02/24/2015  . Chronic kidney disease, stage 3, mod decreased GFR 02/24/2015  . Menopause 02/24/2015  . History of Helicobacter pylori infection 02/24/2015  . Mild mitral insufficiency 02/24/2015  . Mild tricuspid insufficiency 02/24/2015  . 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 ([place]) 02/24/2015  . Controlled gout 02/24/2015  . Depression, major, recurrent, mild ([place]) 02/24/2015  . Morbid obesity due to excess calories ([place]) 02/24/2015  .  Mild pulmonary hypertension 02/24/2015  . Gastroesophageal reflux disease without esophagitis 02/24/2015  . Perennial allergic rhinitis 02/24/2015  . HLD (hyperlipidemia) 02/20/2015  . Hypertension, benign 02/20/2015  . Apnea, sleep 02/20/2015  . Controlled diabetes mellitus with stage 3 chronic kidney disease, without long-term current use of insulin ([place]) 02/20/2015  . Thrombocytosis ([place]) 01/28/2015    Past Surgical History:  Procedure Laterality  Date  . ABDOMINAL HYSTERECTOMY     total  . CATARACT EXTRACTION    . COLONOSCOPY  01/2014   sigmoid diverticulosis. desc colon TA, hyperplastic polyp  . CORONARY ANGIOPLASTY WITH STENT PLACEMENT    . ESOPHAGOGASTRODUODENOSCOPY N/A 02/16/2015   negative h pylori, focal gastic intestinal metaplasia  . TOTAL KNEE ARTHROPLASTY Right     Family History  Problem Relation Age of Onset  . Cancer Sister     unsure  . Alcohol abuse Brother   . Hypertension Mother   . Diabetes Mother   . Heart disease Mother   . Diabetes Father   . Heart disease Father   . Hypertension Father   . Colon cancer Neg Hx   . Liver disease Neg Hx   . Breast cancer Neg Hx     Social History   Social History  . Marital status: Married    Spouse name: N/A  . Number of children: N/A  . Years of education: N/A   Occupational History  . Not on file.   Social History Main Topics  . Smoking status: Former Smoker    Packs/day: 1.00    Years: 40.00    Types: Cigarettes    Quit date: 12/30/2012  . Smokeless tobacco: Never Used  . Alcohol use No  . Drug use: No  . Sexual activity: Not Currently   Other Topics Concern  . Not on file   Social History Narrative  . No narrative on file     Current Outpatient Prescriptions:  .  ACCU-CHEK FASTCLIX LANCETS MISC, , Disp: , Rfl:  .  ACCU-CHEK SMARTVIEW test strip, , Disp: , Rfl:  .  Alcohol Swabs (B-D SINGLE USE SWABS REGULAR) PADS, 1 each by Does not apply route 4 (four) times daily., Disp: 200 each, Rfl: 2 .  allopurinol (ZYLOPRIM) 100 MG tablet, Take 1 tablet (100 mg total) by mouth daily., Disp: 90 tablet, Rfl: 0 .  alum & mag hydroxide-simeth (MAALOX/MYLANTA) 200-200-20 MG/5ML suspension, Take 30 mLs by mouth every 6 (six) hours as needed for indigestion or heartburn., Disp: 355 mL, Rfl: 0 .  amLODipine (NORVASC) 5 MG tablet, Take 1 tablet (5 mg total) by mouth daily., Disp: 90 tablet, Rfl: 0 .  aspirin EC 81 MG tablet, Take 81 mg by mouth every morning.  , Disp: , Rfl:  .  blood glucose meter kit and supplies KIT, Dispense based on patient and insurance preference. Use up to four times daily as directed. (FOR ICD-9 250.00, 250.01)., Disp: 1 each, Rfl: 0 .  citalopram (CELEXA) 20 MG tablet, Take 1 tablet (20 mg total) by mouth daily., Disp: 90 tablet, Rfl: 0 .  Coenzyme Q10 (COQ10) 100 MG CAPS, Take 1 capsule by mouth daily., Disp: 90 each, Rfl: 1 .  Ferrous Sulfate (IRON) 325 (65 Fe) MG TABS, Take by mouth. Nature made brand-  Pt takes one tablet twice a day, Disp: , Rfl:  .  fexofenadine (ALLEGRA) 180 MG tablet, Take 180 mg by mouth daily as needed for allergies. , Disp: , Rfl:  .  fluticasone (FLONASE) 50 MCG/ACT  nasal spray, Place 2 sprays into both nostrils daily., Disp: 48 g, Rfl: 0 .  Insulin Glargine (LANTUS SOLOSTAR) 100 UNIT/ML Solostar Pen, Inject 28-50 Units into the skin daily., Disp: 15 mL, Rfl: 0 .  Insulin Pen Needle (PEN NEEDLES) 31G X 6 MM MISC, Use for insulin pen, Disp: 100 each, Rfl: 2 .  isosorbide mononitrate (IMDUR) 60 MG 24 hr tablet, Take 1 tablet (60 mg total) by mouth daily., Disp: 90 tablet, Rfl: 0 .  lisinopril (PRINIVIL,ZESTRIL) 40 MG tablet, Take 1 tablet (40 mg total) by mouth daily., Disp: 30 tablet, Rfl: 4 .  metoprolol succinate (TOPROL-XL) 50 MG 24 hr tablet, Take 1 tablet (50 mg total) by mouth daily., Disp: 90 tablet, Rfl: 3 .  pantoprazole (PROTONIX) 40 MG tablet, Take 1 tablet (40 mg total) by mouth every morning., Disp: 90 tablet, Rfl: 0 .  rosuvastatin (CRESTOR) 20 MG tablet, Take 1 tablet (20 mg total) by mouth daily., Disp: 90 tablet, Rfl: 0 .  sitaGLIPtin-metformin (JANUMET) 50-1000 MG tablet, Take 1 tablet by mouth 2 (two) times daily with a meal., Disp: 90 tablet, Rfl: 0 .  triamcinolone cream (KENALOG) 0.1 %, Apply 1 application topically 2 (two) times daily., Disp: 30 g, Rfl: 0  Allergies  Allergen Reactions  . Codeine Other (See Comments)    Unknown reaction  . Contrast Media [Iodinated  Diagnostic Agents] Itching  . Sulfa Antibiotics Itching     ROS  Constitutional: Negative for fever or weight change.  Respiratory: Negative for cough and shortness of breath.   Cardiovascular: Negative for chest pain or palpitations.  Gastrointestinal: Negative for abdominal pain, no bowel changes.  Musculoskeletal: Negative for gait problem or joint swelling.  Skin: Negative for rash.  Neurological: Negative for dizziness or headache.  No other specific complaints in a complete review of systems (except as listed in HPI above).  Objective  Vitals:   07/17/16 0921  BP: 128/78  Pulse: 82  Resp: 16  Temp: 97.4 F (36.3 C)  TempSrc: Oral  SpO2: 95%  Weight: 222 lb 9 oz (101 kg)  Height: '5\' 6"'  (1.676 m)    Body mass index is 35.92 kg/m.  Physical Exam  Constitutional: Patient appears well-developed and well-nourished. Obese  No distress.  HEENT: head atraumatic, normocephalic, pupils equal and reactive to light,  neck supple, throat within normal limits Cardiovascular: Normal rate, regular rhythm and normal heart sounds.  No murmur heard. No BLE edema. Pulmonary/Chest: Effort normal and breath sounds normal. No respiratory distress. Abdominal: Soft.  There is no tenderness. Psychiatric: Patient has a normal mood and affect. behavior is normal. Judgment and thought content normal.  Recent Results (from the past 2160 hour(s))  ANA,IFA Sjogrens' Pnl rflx Tit/Patn     Status: Abnormal   Collection Time: 04/26/16  4:57 PM  Result Value Ref Range   Rhuematoid fact SerPl-aCnc <10 <=14 IU/mL    Comment:                            Interpretive Table                     Low Positive: 15 - 41 IU/mL                     High Positive:  >= 42 IU/mL    In addition to the RF result, and clinical symptoms including joint  involvement, the 2010 ACR Classification  Criteria for  scoring/diagnosing Rheumatoid Arthritis include the results of the  following tests:  CRP (92330), ESR  (15010), and CCP (APCA) (07622).  www.rheumatology.org/practice/clinical/classification/ra/ra_2010.asp    Anit Nuclear Antibody(ANA) POS (A) NEGATIVE   SSA ([place]) (ENA) Antibody, IgG <1.0 NEG <1.0 NEG AI   SSB ([place]) (ENA) Antibody, IgG <1.0 NEG <1.0 NEG AI  COMPLETE METABOLIC PANEL WITH GFR     Status: Abnormal   Collection Time: 04/26/16  4:57 PM  Result Value Ref Range   Sodium 136 135 - 146 mmol/L   Potassium 5.0 3.5 - 5.3 mmol/L   Chloride 102 98 - 110 mmol/L   CO2 20 20 - 31 mmol/L   Glucose, Bld 422 (H) 65 - 99 mg/dL    Comment: Result repeated and verified.   BUN 16 7 - 25 mg/dL   Creat 0.96 0.50 - 0.99 mg/dL    Comment:   For patients > or = 65 years of age: The upper reference limit for Creatinine is approximately 13% higher for people identified as African-American.      Total Bilirubin 0.2 0.2 - 1.2 mg/dL   Alkaline Phosphatase 55 33 - 130 U/L   AST 15 10 - 35 U/L   ALT 16 6 - 29 U/L   Total Protein 7.0 6.1 - 8.1 g/dL   Albumin 4.4 3.6 - 5.1 g/dL   Calcium 9.5 8.6 - 10.4 mg/dL   GFR, Est African American 72 >=60 mL/min   GFR, Est Non African American 63 >=60 mL/min  CK total and CKMB (cardiac)not at [facility]     Status: None   Collection Time: 04/26/16  4:57 PM  Result Value Ref Range   Total CK 62 7 - 177 U/L   CK, MB <0.7 0.0 - 5.0 ng/mL   Relative Index SEE NOTE 0.0 - 4.0    Comment: Relative Index not valid when CK is < 100   Result not calculated because one or more required values exceed analytical limits.     Sedimentation rate     Status: None   Collection Time: 04/26/16  4:57 PM  Result Value Ref Range   Sed Rate 2 0 - 30 mm/hr  C-reactive protein     Status: None   Collection Time: 04/26/16  4:57 PM  Result Value Ref Range   CRP <0.5 <0.60 mg/dL  Anti-nuclear ab-titer (ANA titer)     Status: Abnormal   Collection Time: 04/26/16  4:57 PM  Result Value Ref Range   ANA Pattern 1 HOMOGENEOUS (A)    ANA Titer 1 1:40 (H) titer    Comment:            Reference Range           < 1:40      Negative             1:40-1:80 Low Antibody level           > 1:80      Elevated Antibody level   Basic metabolic panel     Status: Abnormal   Collection Time: 04/29/16  4:41 PM  Result Value Ref Range   Sodium 137 135 - 145 mmol/L   Potassium 4.3 3.5 - 5.1 mmol/L   Chloride 106 101 - 111 mmol/L   CO2 20 (L) 22 - 32 mmol/L   Glucose, Bld 406 (H) 65 - 99 mg/dL   BUN 17 6 - 20 mg/dL   Creatinine, Ser 0.96 0.44 - 1.00 mg/dL  Calcium 9.7 8.9 - 10.3 mg/dL   GFR calc non Af Amer >60 >60 mL/min   GFR calc Af Amer >60 >60 mL/min    Comment: (NOTE) The eGFR has been calculated using the CKD EPI equation. This calculation has not been validated in all clinical situations. eGFR's persistently <60 mL/min signify possible Chronic Kidney Disease.    Anion gap 11 5 - 15  CBC     Status: Abnormal   Collection Time: 04/29/16  4:41 PM  Result Value Ref Range   WBC 6.5 3.6 - 11.0 K/uL   RBC 4.33 3.80 - 5.20 MIL/uL   Hemoglobin 12.9 12.0 - 16.0 g/dL   HCT 39.2 35.0 - 47.0 %   MCV 90.5 80.0 - 100.0 fL   MCH 29.9 26.0 - 34.0 pg   MCHC 33.0 32.0 - 36.0 g/dL   RDW 16.5 (H) 11.5 - 14.5 %   Platelets 270 150 - 440 K/uL  Urinalysis complete, with microscopic     Status: Abnormal   Collection Time: 04/29/16  4:41 PM  Result Value Ref Range   Color, Urine YELLOW (A) YELLOW   APPearance CLEAR (A) CLEAR   Glucose, UA >500 (A) NEGATIVE mg/dL   Bilirubin Urine NEGATIVE NEGATIVE   Ketones, ur TRACE (A) NEGATIVE mg/dL   Specific Gravity, Urine 1.017 1.005 - 1.030   Hgb urine dipstick NEGATIVE NEGATIVE   pH 5.0 5.0 - 8.0   Protein, ur NEGATIVE NEGATIVE mg/dL   Nitrite NEGATIVE NEGATIVE   Leukocytes, UA NEGATIVE NEGATIVE   RBC / HPF 0-5 0 - 5 RBC/hpf   WBC, UA 0-5 0 - 5 WBC/hpf   Bacteria, UA RARE (A) NONE SEEN   Squamous Epithelial / LPF 0-5 (A) NONE SEEN  Glucose, capillary     Status: Abnormal   Collection Time: 04/29/16  4:41 PM  Result Value Ref Range    Glucose-Capillary 400 (H) 65 - 99 mg/dL  Troponin I     Status: Abnormal   Collection Time: 04/29/16  4:41 PM  Result Value Ref Range   Troponin I 0.09 (HH) <0.03 ng/mL    Comment: CRITICAL RESULT CALLED TO, READ BACK BY AND VERIFIED WITH LAURIE ALLEN AT 1952 04/29/16.PMH  Glucose, capillary     Status: Abnormal   Collection Time: 04/29/16  6:06 PM  Result Value Ref Range   Glucose-Capillary 371 (H) 65 - 99 mg/dL  Glucose, capillary     Status: Abnormal   Collection Time: 04/29/16  7:59 PM  Result Value Ref Range   Glucose-Capillary 268 (H) 65 - 99 mg/dL  Glucose, capillary     Status: Abnormal   Collection Time: 04/29/16 10:10 PM  Result Value Ref Range   Glucose-Capillary 242 (H) 65 - 99 mg/dL  Glucose, capillary     Status: Abnormal   Collection Time: 04/29/16 11:09 PM  Result Value Ref Range   Glucose-Capillary 252 (H) 65 - 99 mg/dL  Troponin I-serum (0, 3, 6 hours)     Status: Abnormal   Collection Time: 04/29/16 11:28 PM  Result Value Ref Range   Troponin I 0.06 (HH) <0.03 ng/mL    Comment: CRITICAL VALUE NOTED. VALUE IS CONSISTENT WITH PREVIOUSLY REPORTED/CALLED VALUE.PMH  Hemoglobin A1c     Status: Abnormal   Collection Time: 04/30/16  4:49 AM  Result Value Ref Range   Hgb A1c MFr Bld 8.3 (H) 4.0 - 6.0 %  Troponin I-serum (0, 3, 6 hours)     Status: None   Collection Time:  04/30/16  4:49 AM  Result Value Ref Range   Troponin I <0.03 <0.03 ng/mL  Lipid panel     Status: Abnormal   Collection Time: 04/30/16  4:49 AM  Result Value Ref Range   Cholesterol 65 0 - 200 mg/dL   Triglycerides 57 <150 mg/dL   HDL 28 (L) >40 mg/dL   Total CHOL/HDL Ratio 2.3 RATIO   VLDL 11 0 - 40 mg/dL   LDL Cholesterol 26 0 - 99 mg/dL    Comment:        Total Cholesterol/HDL:CHD Risk Coronary Heart Disease Risk Table                     Men   Women  1/2 Average Risk   3.4   3.3  Average Risk       5.0   4.4  2 X Average Risk   9.6   7.1  3 X Average Risk  23.4   11.0        Use  the calculated Patient Ratio above and the CHD Risk Table to determine the patient's CHD Risk.        ATP III CLASSIFICATION (LDL):  <100     mg/dL   Optimal  100-129  mg/dL   Near or Above                    Optimal  130-159  mg/dL   Borderline  160-189  mg/dL   High  >190     mg/dL   Very High   Glucose, capillary     Status: Abnormal   Collection Time: 04/30/16  7:25 AM  Result Value Ref Range   Glucose-Capillary 302 (H) 65 - 99 mg/dL   Comment 1 Notify RN    Comment 2 Document in Chart   Echocardiogram     Status: None   Collection Time: 04/30/16  8:23 AM  Result Value Ref Range   Weight 3,507.2 oz   Height 66 in   BP 157/85 mmHg  Glucose, capillary     Status: Abnormal   Collection Time: 04/30/16 11:12 AM  Result Value Ref Range   Glucose-Capillary 281 (H) 65 - 99 mg/dL   Comment 1 Notify RN    Comment 2 Document in Chart   Glucose, capillary     Status: Abnormal   Collection Time: 04/30/16  4:29 PM  Result Value Ref Range   Glucose-Capillary 381 (H) 65 - 99 mg/dL   Comment 1 Notify RN    Comment 2 Document in Chart   Glucose, capillary     Status: Abnormal   Collection Time: 04/30/16  9:15 PM  Result Value Ref Range   Glucose-Capillary 134 (H) 65 - 99 mg/dL  Basic metabolic panel     Status: Abnormal   Collection Time: 05/01/16  4:30 AM  Result Value Ref Range   Sodium 136 135 - 145 mmol/L   Potassium 3.6 3.5 - 5.1 mmol/L   Chloride 104 101 - 111 mmol/L   CO2 24 22 - 32 mmol/L   Glucose, Bld 322 (H) 65 - 99 mg/dL   BUN 19 6 - 20 mg/dL   Creatinine, Ser 0.66 0.44 - 1.00 mg/dL   Calcium 9.1 8.9 - 10.3 mg/dL   GFR calc non Af Amer >60 >60 mL/min   GFR calc Af Amer >60 >60 mL/min    Comment: (NOTE) The eGFR has been calculated using the CKD EPI equation. This  calculation has not been validated in all clinical situations. eGFR's persistently <60 mL/min signify possible Chronic Kidney Disease.    Anion gap 8 5 - 15  Glucose, capillary     Status: Abnormal    Collection Time: 05/01/16  7:50 AM  Result Value Ref Range   Glucose-Capillary 326 (H) 65 - 99 mg/dL  NM Myocar Multi W/Spect W/Wall Motion / EF     Status: None   Collection Time: 05/01/16 12:09 PM  Result Value Ref Range   Rest HR 100 bpm   Rest BP 119/68 mmHg   Peak HR 117 bpm   Peak BP 130/61 mmHg   SSS 1    SRS 2    SDS 2    TID 0.88    LV sys vol 36 mL   LV dias vol 59 46 - 106 mL  Glucose, capillary     Status: Abnormal   Collection Time: 05/01/16 12:23 PM  Result Value Ref Range   Glucose-Capillary 215 (H) 65 - 99 mg/dL  Glucose, capillary     Status: Abnormal   Collection Time: 05/01/16  5:04 PM  Result Value Ref Range   Glucose-Capillary 348 (H) 65 - 99 mg/dL  Glucose, capillary     Status: Abnormal   Collection Time: 05/01/16  9:19 PM  Result Value Ref Range   Glucose-Capillary 254 (H) 65 - 99 mg/dL  Basic metabolic panel     Status: Abnormal   Collection Time: 05/02/16  3:46 AM  Result Value Ref Range   Sodium 137 135 - 145 mmol/L   Potassium 3.7 3.5 - 5.1 mmol/L   Chloride 104 101 - 111 mmol/L   CO2 25 22 - 32 mmol/L   Glucose, Bld 265 (H) 65 - 99 mg/dL   BUN 24 (H) 6 - 20 mg/dL   Creatinine, Ser 0.67 0.44 - 1.00 mg/dL   Calcium 9.2 8.9 - 10.3 mg/dL   GFR calc non Af Amer >60 >60 mL/min   GFR calc Af Amer >60 >60 mL/min    Comment: (NOTE) The eGFR has been calculated using the CKD EPI equation. This calculation has not been validated in all clinical situations. eGFR's persistently <60 mL/min signify possible Chronic Kidney Disease.    Anion gap 8 5 - 15  Glucose, capillary     Status: Abnormal   Collection Time: 05/02/16  7:36 AM  Result Value Ref Range   Glucose-Capillary 293 (H) 65 - 99 mg/dL  Glucose, capillary     Status: Abnormal   Collection Time: 05/02/16 11:19 AM  Result Value Ref Range   Glucose-Capillary 281 (H) 65 - 99 mg/dL  POCT HgB A1C     Status: None   Collection Time: 06/13/16  1:48 PM  Result Value Ref Range   Hemoglobin A1C  7.3   CBC with Differential     Status: Abnormal   Collection Time: 06/20/16 10:00 AM  Result Value Ref Range   WBC 6.6 3.6 - 11.0 K/uL   RBC 4.57 3.80 - 5.20 MIL/uL   Hemoglobin 13.6 12.0 - 16.0 g/dL   HCT 40.9 35.0 - 47.0 %   MCV 89.6 80.0 - 100.0 fL   MCH 29.8 26.0 - 34.0 pg   MCHC 33.3 32.0 - 36.0 g/dL   RDW 15.9 (H) 11.5 - 14.5 %   Platelets 288 150 - 440 K/uL   Neutrophils Relative % 44 %   Neutro Abs 2.9 1.4 - 6.5 K/uL   Lymphocytes Relative 44 %  Lymphs Abs 3.0 1.0 - 3.6 K/uL   Monocytes Relative 8 %   Monocytes Absolute 0.5 0.2 - 0.9 K/uL   Eosinophils Relative 3 %   Eosinophils Absolute 0.2 0 - 0.7 K/uL   Basophils Relative 1 %   Basophils Absolute 0.0 0 - 0.1 K/uL  Ferritin     Status: None   Collection Time: 06/20/16 10:00 AM  Result Value Ref Range   Ferritin 13 11 - 307 ng/mL      PHQ2/9: Depression screen [facility] 2/9 07/17/2016 06/13/2016 05/11/2016 04/26/2016 03/13/2016  Decreased Interest 0 0 1 0 0  Down, Depressed, Hopeless 0 0 0 0 0  PHQ - 2 Score 0 0 1 0 0     Fall Risk: Fall Risk  07/17/2016 06/13/2016 05/11/2016 04/26/2016 03/13/2016  Falls in the past year? No No No No No    Functional Status Survey: Is the patient deaf or have difficulty hearing?: No Does the patient have difficulty seeing, even when wearing glasses/contacts?: No Does the patient have difficulty concentrating, remembering, or making decisions?: No Does the patient have difficulty walking or climbing stairs?: No Does the patient have difficulty dressing or bathing?: No Does the patient have difficulty doing errands alone such as visiting a doctor's office or shopping?: No  Assessment & Plan  1. Uncontrolled type 2 diabetes mellitus with gastroparesis ([place])  She denies hypoglycemic episodes - sitaGLIPtin-metformin (JANUMET) 50-1000 MG tablet; Take 1 tablet by mouth 2 (two) times daily with a meal.  Dispense: 90 tablet; Refill: 0 - Insulin Glargine (LANTUS SOLOSTAR) 100 UNIT/ML  Solostar Pen; Inject 28-50 Units into the skin daily.  Dispense: 15 mL; Refill: 0 - Insulin Pen Needle (PEN NEEDLES) 31G X 6 MM MISC; Use for insulin pen  Dispense: 100 each; Refill: 2  2. Uncontrolled type 2 diabetes mellitus with microalbuminuria, with long-term current use of insulin ([facility])  - sitaGLIPtin-metformin (JANUMET) 50-1000 MG tablet; Take 1 tablet by mouth 2 (two) times daily with a meal.  Dispense: 90 tablet; Refill: 0 - Insulin Glargine (LANTUS SOLOSTAR) 100 UNIT/ML Solostar Pen; Inject 28-50 Units into the skin daily.  Dispense: 15 mL; Refill: 0 - Insulin Pen Needle (PEN NEEDLES) 31G X 6 MM MISC; Use for insulin pen  Dispense: 100 each; Refill: 2  3. Angina pectoris ([facility]CC)  - isosorbide mononitrate (IMDUR) 60 MG 24 hr tablet; Take 1 tablet (60 mg total) by mouth daily.  Dispense: 90 tablet; Refill: 0  4. Depression, major, recurrent, mild ([facility])  - citalopram (CELEXA) 20 MG tablet; Take 1 tablet (20 mg total) by mouth daily.  Dispense: 90 tablet; Refill: 0  5. Thrombocytosis ([facility])   6. Other iron deficiency anemia  We will recheck labs next visit   7. Morbid obesity due to excess calories [facility])  Discussed with the patient the risk posed by an increased BMI. Discussed importance of portion control, calorie counting and at least 150 minutes of physical activity weekly. Avoid sweet beverages and drink more water. Eat at least 6 servings of fruit and vegetables daily   8. Hypertension, benign  - metoprolol succinate (TOPROL-XL) 50 MG 24 hr tablet; Take 1 tablet (50 mg total) by mouth daily.  Dispense: 90 tablet; Refill: 3 - amLODipine (NORVASC) 5 MG tablet; Take 1 tablet (5 mg total) by mouth daily.  Dispense: 90 tablet; Refill: 0  9. Obstructive sleep apnea syndrome   10. Controlled gout  - allopurinol (ZYLOPRIM) 100 MG tablet; Take 1 tablet (100 mg total) by mouth  daily.  Dispense: 90 tablet; Refill: 0  11. Gastroesophageal reflux disease without esophagitis  -  pantoprazole (PROTONIX) 40 MG tablet; Take 1 tablet (40 mg total) by mouth every morning.  Dispense: 90 tablet; Refill: 0  12. Perennial allergic rhinitis  - fluticasone (FLONASE) 50 MCG/ACT nasal spray; Place 2 sprays into both nostrils daily.  Dispense: 48 g; Refill: 0  13. Dyslipidemia  - rosuvastatin (CRESTOR) 20 MG tablet; Take 1 tablet (20 mg total) by mouth daily.  Dispense: 90 tablet; Refill: 0

## 2016-07-19 ENCOUNTER — Telehealth: Payer: Self-pay

## 2016-07-19 NOTE — Telephone Encounter (Signed)
Patient states as long as she can remember she has always been on Lisinopril 40 mg 1 tablet bid. It was faxed into Medication Management as 1 tablet daily, but never told during her visit this changed. Did Dr. Ancil Boozer mean to change her therapy on Lisinopril? Patient states her BP has been well controlled with Lisinopril 40 mg bid. Please advise.

## 2016-07-20 ENCOUNTER — Other Ambulatory Visit: Payer: Self-pay | Admitting: Family Medicine

## 2016-07-20 MED ORDER — INSULIN DETEMIR 100 UNIT/ML FLEXPEN: 30.0000 [IU] | PEN_INJECTOR | Freq: Every day | SUBCUTANEOUS | 2 refills | Status: DC

## 2016-07-20 NOTE — Telephone Encounter (Signed)
I have been prescribing 1 daily for a long time, ask her to take once daily and we will monitor bp , it was towards low end of normal on her last visit

## 2016-07-20 NOTE — Telephone Encounter (Signed)
Tiffany spoke with patient

## 2016-08-07 DIAGNOSIS — M791 Myalgia: Secondary | ICD-10-CM | POA: Diagnosis not present

## 2016-08-07 DIAGNOSIS — R768 Other specified abnormal immunological findings in serum: Secondary | ICD-10-CM | POA: Diagnosis not present

## 2016-08-25 ENCOUNTER — Encounter: Payer: Self-pay | Admitting: Emergency Medicine

## 2016-08-25 ENCOUNTER — Ambulatory Visit (INDEPENDENT_AMBULATORY_CARE_PROVIDER_SITE_OTHER): Payer: Commercial Managed Care - HMO

## 2016-08-25 ENCOUNTER — Ambulatory Visit
Admission: EM | Admit: 2016-08-25 | Discharge: 2016-08-25 | Disposition: A | Discharge status: Home / Self Care | Payer: Commercial Managed Care - HMO | Attending: Internal Medicine | Admitting: Internal Medicine

## 2016-08-25 DIAGNOSIS — S199XXA Unspecified injury of neck, initial encounter: Secondary | ICD-10-CM | POA: Diagnosis not present

## 2016-08-25 DIAGNOSIS — S8001XA Contusion of right knee, initial encounter: Secondary | ICD-10-CM

## 2016-08-25 DIAGNOSIS — S0081XA Abrasion of other part of head, initial encounter: Secondary | ICD-10-CM

## 2016-08-25 DIAGNOSIS — S63502A Unspecified sprain of left wrist, initial encounter: Secondary | ICD-10-CM

## 2016-08-25 DIAGNOSIS — S63642A Sprain of metacarpophalangeal joint of left thumb, initial encounter: Secondary | ICD-10-CM

## 2016-08-25 DIAGNOSIS — M542 Cervicalgia: Secondary | ICD-10-CM

## 2016-08-25 DIAGNOSIS — S60211A Contusion of right wrist, initial encounter: Secondary | ICD-10-CM

## 2016-08-25 DIAGNOSIS — S8002XA Contusion of left knee, initial encounter: Secondary | ICD-10-CM | POA: Diagnosis not present

## 2016-08-25 DIAGNOSIS — S6992XA Unspecified injury of left wrist, hand and finger(s), initial encounter: Secondary | ICD-10-CM | POA: Diagnosis not present

## 2016-08-25 DIAGNOSIS — M25532 Pain in left wrist: Secondary | ICD-10-CM | POA: Diagnosis not present

## 2016-08-25 DIAGNOSIS — M79642 Pain in left hand: Secondary | ICD-10-CM | POA: Diagnosis not present

## 2016-08-25 DIAGNOSIS — S6991XA Unspecified injury of right wrist, hand and finger(s), initial encounter: Secondary | ICD-10-CM | POA: Diagnosis not present

## 2016-08-25 NOTE — ED Triage Notes (Signed)
Patient states that she fell on Wed as she was going up the stairs.  Patient states that she hit her face, left hip and left hand.  Patient c/o pain in her left thumb and hand.

## 2016-08-25 NOTE — ED Provider Notes (Signed)
[place]    CSN: [id number] Arrival date & time: 08/25/16  8416     History   Chief Complaint Chief Complaint  Patient presents with  . Fall    HPI Dana Sparks is a 65 y.o. female. She presents today with pain in her left hand and wrist after falling while going upstairs [employer] 2 days ago. She is not sure why she fell, did not have dizziness/chest pain/dyspnea. Does not usually have trouble with falling. She has swelling and bruising of the radial aspect of the left hand and wrist, pain and slight bruising at the ulnar aspect of the right wrist, bruises on both anterior knees. Discomfort in some right lateral lower ribs, not causing breathlessness. Tiny abrasion/bruise on the right bridge of the nose. No displacement of teeth, no injury to lips or tongue. Did not have nose bleeding. Has some headache, left temple. No difficulty walking, able to walk into the urgent care independently today.   HPI  Past Medical History:  Diagnosis Date  . Allergy   . Anxiety   . Arthritis   . CAD (coronary artery disease)   . COPD (chronic obstructive pulmonary disease) ([place])   . Depression   . Diabetes mellitus without complication ([place])   . Diverticulitis   . Gastroparesis   . Gout   . Hyperlipemia   . Hypertension   . Positive H. pylori test   . Renal insufficiency   . Sleep apnea   . Thrombocytosis ([place]) 01/28/2015  . Tubular adenoma of colon 01/20/14    Patient Active Problem List   Diagnosis Date Noted  . Elevated antinuclear antibody (ANA) level 04/27/2016  . Angina pectoris ([place]) 11/01/2015  . Well controlled type 2 diabetes mellitus with gastroparesis ([place]) 06/22/2015  . Low TSH level 06/22/2015  . Iron deficiency anemia 04/07/2015  . Intestinal metaplasia of gastric mucosa 03/11/2015  . CAD (coronary artery disease), native coronary artery 02/24/2015  . History of coronary artery stent placement 02/24/2015  . Chronic kidney disease, stage  3, mod decreased GFR 02/24/2015  . Menopause 02/24/2015  . History of Helicobacter pylori infection 02/24/2015  . Mild mitral insufficiency 02/24/2015  . Mild tricuspid insufficiency 02/24/2015  . 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 ([place]) 02/24/2015  . Controlled gout 02/24/2015  . Depression, major, recurrent, mild ([place]) 02/24/2015  . Morbid obesity due to excess calories ([place]) 02/24/2015  . Mild pulmonary hypertension 02/24/2015  . Gastroesophageal reflux disease without esophagitis 02/24/2015  . Perennial allergic rhinitis 02/24/2015  . HLD (hyperlipidemia) 02/20/2015  . Hypertension, benign 02/20/2015  . Apnea, sleep 02/20/2015  . Controlled diabetes mellitus with stage 3 chronic kidney disease, without long-term current use of insulin ([place]) 02/20/2015  . Thrombocytosis ([place]) 01/28/2015    Past Surgical History:  Procedure Laterality Date  . ABDOMINAL HYSTERECTOMY     total  . CATARACT EXTRACTION    . COLONOSCOPY  01/2014   sigmoid diverticulosis. desc colon TA, hyperplastic polyp  . CORONARY ANGIOPLASTY WITH STENT PLACEMENT    . ESOPHAGOGASTRODUODENOSCOPY N/A 02/16/2015   negative h pylori, focal gastic intestinal metaplasia  . TOTAL KNEE ARTHROPLASTY Right        Home Medications    Prior to Admission medications   Medication Sig Start Date End Date Taking? Authorizing Provider  ACCU-CHEK FASTCLIX LANCETS [address]  06/06/16   Historical Provider, MD  [place] test strip  06/06/16   Historical Provider, MD  Alcohol Swabs (B-D  SINGLE USE SWABS REGULAR) PADS 1 each by Does not apply route 4 (four) times daily. 06/11/16   Steele Sizer, MD  allopurinol (ZYLOPRIM) 100 MG tablet Take 1 tablet (100 mg total) by mouth daily. 07/17/16   Steele Sizer, MD  alum & mag hydroxide-simeth (MAALOX/MYLANTA) 200-200-20 MG/5ML suspension Take 30 mLs by mouth every 6 (six) hours as needed for indigestion or heartburn. 07/17/16   Steele Sizer, MD    amLODipine (NORVASC) 5 MG tablet Take 1 tablet (5 mg total) by mouth daily. 07/17/16   Steele Sizer, MD  aspirin EC 81 MG tablet Take 81 mg by mouth every morning.     Historical Provider, MD  blood glucose meter kit and supplies KIT Dispense based on patient and insurance preference. Use up to four times daily as directed. (FOR ICD-9 250.00, 250.01). 05/02/16   Gladstone Lighter, MD  citalopram (CELEXA) 20 MG tablet Take 1 tablet (20 mg total) by mouth daily. 07/17/16   Steele Sizer, MD  Coenzyme Q10 (COQ10) 100 MG CAPS Take 1 capsule by mouth daily. 04/26/16   Steele Sizer, MD  Ferrous Sulfate (IRON) 325 (65 Fe) MG TABS Take by mouth. Nature made brand-  Pt takes one tablet twice a day    Historical Provider, MD  fexofenadine (ALLEGRA) 180 MG tablet Take 180 mg by mouth daily as needed for allergies.     Historical Provider, MD  fluticasone (FLONASE) 50 MCG/ACT nasal spray Place 2 sprays into both nostrils daily. 07/17/16   Steele Sizer, MD  Insulin Detemir (LEVEMIR FLEXPEN) 100 UNIT/ML Pen Inject 30-50 Units into the skin daily at 10 pm. 07/20/16   Steele Sizer, MD  Insulin Pen Needle (PEN NEEDLES) 31G X 6 MM MISC Use for insulin pen 07/17/16   Steele Sizer, MD  isosorbide mononitrate (IMDUR) 60 MG 24 hr tablet Take 1 tablet (60 mg total) by mouth daily. 07/17/16   Steele Sizer, MD  lisinopril (PRINIVIL,ZESTRIL) 40 MG tablet Take 1 tablet (40 mg total) by mouth daily. 07/17/16   Steele Sizer, MD  metoprolol succinate (TOPROL-XL) 50 MG 24 hr tablet Take 1 tablet (50 mg total) by mouth daily. 07/17/16   Steele Sizer, MD  pantoprazole (PROTONIX) 40 MG tablet Take 1 tablet (40 mg total) by mouth every morning. 07/17/16   Steele Sizer, MD  rosuvastatin (CRESTOR) 20 MG tablet Take 1 tablet (20 mg total) by mouth daily. 07/17/16   Steele Sizer, MD  sitaGLIPtin-metformin (JANUMET) 50-1000 MG tablet Take 1 tablet by mouth 2 (two) times daily with a meal. 07/17/16   Steele Sizer, MD   triamcinolone cream (KENALOG) 0.1 % Apply 1 application topically 2 (two) times daily. 05/10/16   Steele Sizer, MD    Family History Family History  Problem Relation Age of Onset  . Cancer Sister     unsure  . Alcohol abuse Brother   . Hypertension Mother   . Diabetes Mother   . Heart disease Mother   . Diabetes Father   . Heart disease Father   . Hypertension Father   . Colon cancer Neg Hx   . Liver disease Neg Hx   . Breast cancer Neg Hx     Social History Social History  Substance Use Topics  . Smoking status: Former Smoker    Packs/day: 1.00    Years: 40.00    Types: Cigarettes    Quit date: 12/30/2012  . Smokeless tobacco: Never Used  . Alcohol use No     Allergies  Codeine; Contrast media [iodinated diagnostic agents]; and Sulfa antibiotics   Review of Systems Review of Systems  All other systems reviewed and are negative.    Physical Exam Triage Vital Signs ED Triage Vitals  Enc Vitals Group     BP 08/25/16 0907 (!) 151/73     Pulse Rate 08/25/16 0907 73     Resp 08/25/16 0907 16     Temp 08/25/16 0907 97.1 F (36.2 C)     Temp Source 08/25/16 0907 Tympanic     SpO2 08/25/16 0907 100 %     Weight 08/25/16 0905 217 lb (98.4 kg)     Height 08/25/16 0905 5' 5.5" (1.664 m)     Pain Score 08/25/16 0906 3   Updated Vital Signs BP (!) 151/73 (BP Location: Right Arm)   Pulse 73   Temp 97.1 F (36.2 C) (Tympanic)   Resp 16   Ht 5' 5.5" (1.664 m)   Wt 217 lb (98.4 kg)   SpO2 100%   BMI 35.56 kg/m  Physical Exam  Constitutional: She is oriented to person, place, and time. No distress.  Alert, nicely groomed  HENT:  Head: Atraumatic.  No hemotympanum, no nasal septal hematoma. Tiny superficial laceration/bruise over the right bridge of the nose. Tongue midline, no injury to lips or tongue observed.  Eyes: EOM are normal. Pupils are equal, round, and reactive to light.  Conjugate gaze, no eye redness/drainage  Neck: Neck supple.  Some spasm  palpable in the left trapezius muscle, some focal discomfort to palpation at C6/C7.  Cardiovascular: Normal rate.   Pulmonary/Chest: No respiratory distress.  Lungs clear, symmetric breath sounds  Abdominal: She exhibits no distension.  Musculoskeletal: Normal range of motion.  No leg swelling Some bruising but no swelling or effusion over both anterior knees. Left wrist is swollen and bruised and the swelling extends to the dorsal hand second MCP. Pain with some range of motion. Tender over the radial aspect of the wrist. Focal tenderness over the ulnar aspect of the right breast, with faint bruising. Preserved range of motion.  Neurological: She is alert and oriented to person, place, and time.  Skin: Skin is warm and dry.  No cyanosis  Nursing note and vitals reviewed.    UC Treatments / Results   Radiology Dg Cervical Spine 2-3 Views  Result Date: 08/25/2016 CLINICAL DATA:  Fall 3 days ago with head injury. Initial encounter. EXAM: CERVICAL SPINE - 2-3 VIEW COMPARISON:  Cervical spine CT 07/19/2008 FINDINGS: No evidence of acute fracture or traumatic malalignment. Spondylosis and disc narrowing predominately from C4-5 to C6-7. Posterior longitudinal ligament ossification at C4 and C5 are better seen on comparison CT. No prevertebral thickening. IMPRESSION: No acute finding. Electronically Signed   By: Monte Fantasia M.D.   On: 08/25/2016 10:12   Dg Wrist Complete Left  Result Date: 08/25/2016 CLINICAL DATA:  Bilateral wrist and left hand pain after fall. Initial encounter. EXAM: LEFT WRIST - COMPLETE 3+ VIEW COMPARISON:  None. FINDINGS: There is no evidence of fracture or dislocation. Osteopenia. IMPRESSION: Negative for fracture. Electronically Signed   By: Monte Fantasia M.D.   On: 08/25/2016 10:10   Dg Wrist Complete Right  Result Date: 08/25/2016 CLINICAL DATA:  Fall. EXAM: RIGHT WRIST - COMPLETE 3+ VIEW COMPARISON:  No recent prior. FINDINGS: Diffuse osteopenia and  degenerative change. No acute bony or joint abnormality identified. No evidence of fracture or dislocation. IMPRESSION: Diffuse osteopenia and degenerative change. No acute abnormality identified. Electronically Signed  By: [place]   On: 08/25/2016 10:11   Dg Hand Complete Left  Result Date: 08/25/2016 CLINICAL DATA:  Fall.  Pain EXAM: LEFT HAND - COMPLETE 3+ VIEW COMPARISON:  Left wrist today FINDINGS: Negative for fracture. Moderate degenerative change at the base of thumb. Mild degenerative change in the interphalangeal joints. No erosion. IMPRESSION: Negative for fracture. Electronically Signed   By: Franchot Gallo M.D.   On: 08/25/2016 10:10    Procedures Procedures (including critical care time) Left wrist thumb spica splint applied in urgent care  Final Clinical Impressions(s) / UC Diagnoses   Final diagnoses:  Sprain of metacarpophalangeal (MCP) joint of left thumb, initial encounter  Left wrist sprain, initial encounter  Contusion of right knee, initial encounter  Contusion of left knee, initial encounter  Contusion of right wrist, initial encounter  Facial abrasion, initial encounter   Wear splint left wrist for 7-10 days.  Recheck if not improving.   Sherlene Shams, MD 08/26/16 [phone]

## 2016-08-25 NOTE — Discharge Instructions (Addendum)
Wear splint left wrist for 7-10 days.  Recheck if not improving.

## 2016-09-12 ENCOUNTER — Telehealth: Payer: Self-pay | Admitting: Family Medicine

## 2016-09-12 NOTE — Telephone Encounter (Signed)
Pt requesting return call.  

## 2016-09-12 NOTE — Telephone Encounter (Signed)
Charisse March spoke with patient

## 2016-09-12 NOTE — Telephone Encounter (Signed)
Called both numbers associated with pt and left voicemail for her to return my call.

## 2016-09-25 DIAGNOSIS — E1142 Type 2 diabetes mellitus with diabetic polyneuropathy: Secondary | ICD-10-CM | POA: Diagnosis not present

## 2016-09-25 DIAGNOSIS — L851 Acquired keratosis [keratoderma] palmaris et plantaris: Secondary | ICD-10-CM | POA: Diagnosis not present

## 2016-09-25 DIAGNOSIS — B351 Tinea unguium: Secondary | ICD-10-CM | POA: Diagnosis not present

## 2016-10-03 ENCOUNTER — Telehealth: Payer: Self-pay | Admitting: Family Medicine

## 2016-10-03 ENCOUNTER — Other Ambulatory Visit: Payer: Self-pay | Admitting: Family Medicine

## 2016-10-03 DIAGNOSIS — E1143 Type 2 diabetes mellitus with diabetic autonomic (poly)neuropathy: Secondary | ICD-10-CM

## 2016-10-03 DIAGNOSIS — E785 Hyperlipidemia, unspecified: Secondary | ICD-10-CM

## 2016-10-03 DIAGNOSIS — Z794 Long term (current) use of insulin: Secondary | ICD-10-CM

## 2016-10-03 DIAGNOSIS — K219 Gastro-esophageal reflux disease without esophagitis: Secondary | ICD-10-CM

## 2016-10-03 DIAGNOSIS — E1129 Type 2 diabetes mellitus with other diabetic kidney complication: Secondary | ICD-10-CM

## 2016-10-03 DIAGNOSIS — R809 Proteinuria, unspecified: Secondary | ICD-10-CM

## 2016-10-03 DIAGNOSIS — M109 Gout, unspecified: Secondary | ICD-10-CM

## 2016-10-03 DIAGNOSIS — I10 Essential (primary) hypertension: Secondary | ICD-10-CM

## 2016-10-03 DIAGNOSIS — E1165 Type 2 diabetes mellitus with hyperglycemia: Principal | ICD-10-CM

## 2016-10-03 DIAGNOSIS — J3089 Other allergic rhinitis: Secondary | ICD-10-CM

## 2016-10-03 DIAGNOSIS — F330 Major depressive disorder, recurrent, mild: Secondary | ICD-10-CM

## 2016-10-03 DIAGNOSIS — IMO0002 Reserved for concepts with insufficient information to code with codable children: Secondary | ICD-10-CM

## 2016-10-03 MED ORDER — LISINOPRIL 40 MG PO TABS: 40.0000 mg | ORAL_TABLET | Freq: Every day | ORAL | 3 refills | Status: DC

## 2016-10-03 MED ORDER — PANTOPRAZOLE SODIUM 40 MG PO TBEC: 40.0000 mg | DELAYED_RELEASE_TABLET | Freq: Every morning | ORAL | 1 refills | Status: DC

## 2016-10-03 MED ORDER — FLUTICASONE PROPIONATE 50 MCG/ACT NA SUSP: 2.0000 | Freq: Every day | NASAL | 1 refills | Status: DC

## 2016-10-03 MED ORDER — CITALOPRAM HYDROBROMIDE 20 MG PO TABS: 20.0000 mg | ORAL_TABLET | Freq: Every day | ORAL | 3 refills | Status: DC

## 2016-10-03 MED ORDER — ROSUVASTATIN CALCIUM 20 MG PO TABS: 20.0000 mg | ORAL_TABLET | Freq: Every day | ORAL | 1 refills | Status: DC

## 2016-10-03 MED ORDER — ROSUVASTATIN CALCIUM 20 MG PO TABS: 20.0000 mg | ORAL_TABLET | Freq: Every day | ORAL | 3 refills | Status: DC

## 2016-10-03 MED ORDER — AMLODIPINE BESYLATE 5 MG PO TABS: 5.0000 mg | ORAL_TABLET | Freq: Every day | ORAL | 3 refills | Status: DC

## 2016-10-03 MED ORDER — INSULIN DETEMIR 100 UNIT/ML FLEXPEN: 30.0000 [IU] | PEN_INJECTOR | Freq: Every day | SUBCUTANEOUS | 2 refills | Status: DC

## 2016-10-03 MED ORDER — SITAGLIPTIN PHOS-METFORMIN HCL 50-1000 MG PO TABS: 1.0000 | ORAL_TABLET | Freq: Two times a day (BID) | ORAL | 3 refills | Status: DC

## 2016-10-03 MED ORDER — ALLOPURINOL 100 MG PO TABS: 100.0000 mg | ORAL_TABLET | Freq: Every day | ORAL | 3 refills | Status: DC

## 2016-10-03 MED ORDER — SITAGLIPTIN PHOS-METFORMIN HCL 50-1000 MG PO TABS: 1.0000 | ORAL_TABLET | Freq: Two times a day (BID) | ORAL | 1 refills | Status: DC

## 2016-10-03 MED ORDER — METOPROLOL SUCCINATE ER 50 MG PO TB24: 50.0000 mg | ORAL_TABLET | Freq: Every day | ORAL | 3 refills | Status: DC

## 2016-10-03 NOTE — Telephone Encounter (Signed)
done

## 2016-10-03 NOTE — Telephone Encounter (Signed)
Patient needs all of her medications refilled and sent to [facility].  Patient stated that she the Januvia to be called into [facility] right away because she will be out on Monday.

## 2016-10-09 ENCOUNTER — Telehealth: Payer: Self-pay

## 2016-10-09 ENCOUNTER — Other Ambulatory Visit: Payer: Self-pay | Admitting: Family Medicine

## 2016-10-09 NOTE — Progress Notes (Unsigned)
Levemir and lantus are the same type of insulin. Insurance likely asked to change from Lantus to Levemir ( coverage changes). She needs to finish Lantus and than use Levemir same dose for both.  Continue Janumet

## 2016-10-09 NOTE — Telephone Encounter (Signed)
Patient called and states she received Lantus at [facility] and the mail order sent her two boxes Levemir in the mail from Dr. Ancil Boozer. Wanted to make sure why is she on both insulins and Janumet. Is this correct?

## 2016-10-10 NOTE — Progress Notes (Signed)
Patient was notified to finish all her prescription left on the Lantus and then resume Levemir. Also informed [facility] to DC Lantus order.

## 2016-10-12 ENCOUNTER — Other Ambulatory Visit: Payer: Self-pay | Admitting: Family Medicine

## 2016-10-12 DIAGNOSIS — R29898 Other symptoms and signs involving the musculoskeletal system: Secondary | ICD-10-CM

## 2016-10-17 ENCOUNTER — Other Ambulatory Visit: Payer: Self-pay

## 2016-10-17 DIAGNOSIS — E1143 Type 2 diabetes mellitus with diabetic autonomic (poly)neuropathy: Secondary | ICD-10-CM

## 2016-10-17 DIAGNOSIS — Z794 Long term (current) use of insulin: Secondary | ICD-10-CM

## 2016-10-17 DIAGNOSIS — R809 Proteinuria, unspecified: Secondary | ICD-10-CM

## 2016-10-17 DIAGNOSIS — IMO0002 Reserved for concepts with insufficient information to code with codable children: Secondary | ICD-10-CM

## 2016-10-17 DIAGNOSIS — E1129 Type 2 diabetes mellitus with other diabetic kidney complication: Secondary | ICD-10-CM

## 2016-10-17 DIAGNOSIS — E1165 Type 2 diabetes mellitus with hyperglycemia: Principal | ICD-10-CM

## 2016-10-17 NOTE — Telephone Encounter (Signed)
Patient requesting refill of Janumet to [facility].

## 2016-10-18 ENCOUNTER — Ambulatory Visit: Payer: Commercial Managed Care - HMO | Admitting: Family Medicine

## 2016-10-19 MED ORDER — SITAGLIPTIN PHOS-METFORMIN HCL 50-1000 MG PO TABS: 1.0000 | ORAL_TABLET | Freq: Two times a day (BID) | ORAL | 1 refills | Status: DC

## 2016-10-25 ENCOUNTER — Ambulatory Visit: Payer: Commercial Managed Care - HMO | Admitting: Family Medicine

## 2016-11-10 ENCOUNTER — Ambulatory Visit (INDEPENDENT_AMBULATORY_CARE_PROVIDER_SITE_OTHER): Payer: Medicare HMO | Admitting: Family Medicine

## 2016-11-10 ENCOUNTER — Encounter: Payer: Self-pay | Admitting: Family Medicine

## 2016-11-10 VITALS — BP 130/80 | HR 94 | Temp 97.9°F | Resp 16 | Ht 66.0 in | Wt 221.4 lb

## 2016-11-10 DIAGNOSIS — D508 Other iron deficiency anemias: Secondary | ICD-10-CM

## 2016-11-10 DIAGNOSIS — Z794 Long term (current) use of insulin: Secondary | ICD-10-CM

## 2016-11-10 DIAGNOSIS — G4733 Obstructive sleep apnea (adult) (pediatric): Secondary | ICD-10-CM | POA: Diagnosis not present

## 2016-11-10 DIAGNOSIS — F330 Major depressive disorder, recurrent, mild: Secondary | ICD-10-CM

## 2016-11-10 DIAGNOSIS — IMO0002 Reserved for concepts with insufficient information to code with codable children: Secondary | ICD-10-CM

## 2016-11-10 DIAGNOSIS — I10 Essential (primary) hypertension: Secondary | ICD-10-CM | POA: Diagnosis not present

## 2016-11-10 DIAGNOSIS — Z23 Encounter for immunization: Secondary | ICD-10-CM

## 2016-11-10 DIAGNOSIS — I209 Angina pectoris, unspecified: Secondary | ICD-10-CM

## 2016-11-10 DIAGNOSIS — R29898 Other symptoms and signs involving the musculoskeletal system: Secondary | ICD-10-CM

## 2016-11-10 DIAGNOSIS — E1143 Type 2 diabetes mellitus with diabetic autonomic (poly)neuropathy: Secondary | ICD-10-CM | POA: Diagnosis not present

## 2016-11-10 DIAGNOSIS — R946 Abnormal results of thyroid function studies: Secondary | ICD-10-CM | POA: Diagnosis not present

## 2016-11-10 DIAGNOSIS — E1129 Type 2 diabetes mellitus with other diabetic kidney complication: Secondary | ICD-10-CM | POA: Diagnosis not present

## 2016-11-10 DIAGNOSIS — E1165 Type 2 diabetes mellitus with hyperglycemia: Secondary | ICD-10-CM | POA: Diagnosis not present

## 2016-11-10 DIAGNOSIS — R809 Proteinuria, unspecified: Secondary | ICD-10-CM

## 2016-11-10 DIAGNOSIS — R7989 Other specified abnormal findings of blood chemistry: Secondary | ICD-10-CM

## 2016-11-10 LAB — CBC WITH DIFFERENTIAL/PLATELET
BASOS ABS: 0 {cells}/uL (ref 0–200)
BASOS PCT: 0 %
EOS ABS: 261 {cells}/uL (ref 15–500)
Eosinophils Relative: 3 %
HEMATOCRIT: 40.9 % (ref 35.0–45.0)
Hemoglobin: 13.4 g/dL (ref 11.7–15.5)
LYMPHS PCT: 46 %
Lymphs Abs: 4002 cells/uL — ABNORMAL HIGH (ref 850–3900)
MCH: 29.6 pg (ref 27.0–33.0)
MCHC: 32.8 g/dL (ref 32.0–36.0)
MCV: 90.3 fL (ref 80.0–100.0)
MONO ABS: 609 {cells}/uL (ref 200–950)
MONOS PCT: 7 %
MPV: 9.5 fL (ref 7.5–12.5)
Neutro Abs: 3828 cells/uL (ref 1500–7800)
Neutrophils Relative %: 44 %
PLATELETS: 352 10*3/uL (ref 140–400)
RBC: 4.53 MIL/uL (ref 3.80–5.10)
RDW: 14.3 % (ref 11.0–15.0)
WBC: 8.7 10*3/uL (ref 3.8–10.8)

## 2016-11-10 LAB — IRON,TIBC AND FERRITIN PANEL
%SAT: 18 % (ref 11–50)
Ferritin: 10 ng/mL — ABNORMAL LOW (ref 20–288)
Iron: 71 ug/dL (ref 45–160)
TIBC: 387 ug/dL (ref 250–450)

## 2016-11-10 LAB — TSH: TSH: 0.4 mIU/L

## 2016-11-10 LAB — POCT GLYCOSYLATED HEMOGLOBIN (HGB A1C): Hemoglobin A1C: 7.7

## 2016-11-10 MED ORDER — ISOSORBIDE MONONITRATE ER 60 MG PO TB24: 60.0000 mg | ORAL_TABLET | Freq: Every day | ORAL | 1 refills | Status: DC

## 2016-11-10 MED ORDER — INSULIN DEGLUDEC 200 UNIT/ML ~~LOC~~ SOPN: 50.0000 [IU] | PEN_INJECTOR | Freq: Every day | SUBCUTANEOUS | 1 refills | Status: DC

## 2016-11-10 NOTE — Progress Notes (Signed)
Name: Dana Sparks   MRN: [id number]    DOB: Jun 23, 1951   Date:11/10/2016       Progress Note  Subjective  Chief Complaint  Chief Complaint  Patient presents with  . Diabetes  . Hypertension    HPI  Anemia: she was diagnosed earlier 2016 with h. Pylori, she was having a lot of dyspepsia. We tried treating her with antibiotics but she was unable to tolerate it. She was referred to Dr. Durwin Reges and Oncologist because of severe anemia. EGD was neg, she is taking ferrous sulfate twice  daily and she states she has been compliant. No longer has epigastric pain. Dr. Mike Gip has recommended labs every 3 months, 3 hemoccult cards negative in 2017.   DMII with gastroparesis/CKI: she is taking medication as prescribed, has not been checking glucose at home, fasting is between 120-180's. Her hgbA1C is elevated - last hgbA1C 7.3 % and today is up to 7.7%  she has not been following a diabetic diet, and not exercising She denies hypoglycemia, polydipsia, or polyuria. She has episodes of polyphagia She is on Ace for diabetic CKI, urine micro is up to 50 last visit. States gastroparesis symptoms are controlled - not on medication. Frozen shoulder on right side, improved with home PT.   HTN: at goal, no side effects of medication. No chest pain or palpitation.   Angina: CAD, her cardiologist is Dr. Clayborn Bigness, taking Imdur daily and Toprol XL and has chest pain once or twice a week, it lasts  She is also on statin therapy, aspirin  and ace.She is on disability for it for many years. Still sees Dr. Clayborn Bigness  OSA: she is  using a CPAP machine every night, all night long.   Major Depression Mild: she denies crying spells. She has some fatigue, also anhedonia, she is taking antidepressants - Citalopram, and states symptoms are mild and does not want to change medication. She is not intimate with her husband - they don't sleep in the same room. She states she does not care.She states they have a good  relationship - not having sex is a mutual agreement. She states she has days that she worries a lot, but stable.  Morbid obesity: weight is stable.  She likes to eat Explained importance of dietary modification and regular exercise   Patient Active Problem List   Diagnosis Date Noted  . Left hand weakness 10/12/2016  . Elevated antinuclear antibody (ANA) level 04/27/2016  . Angina pectoris ([place]) 11/01/2015  . Well controlled type 2 diabetes mellitus with gastroparesis ([place]) 06/22/2015  . Low TSH level 06/22/2015  . Iron deficiency anemia 04/07/2015  . Intestinal metaplasia of gastric mucosa 03/11/2015  . CAD (coronary artery disease), native coronary artery 02/24/2015  . History of coronary artery stent placement 02/24/2015  . Chronic kidney disease, stage 3, mod decreased GFR 02/24/2015  . Menopause 02/24/2015  . History of Helicobacter pylori infection 02/24/2015  . Mild mitral insufficiency 02/24/2015  . Mild tricuspid insufficiency 02/24/2015  . 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 ([place]) 02/24/2015  . Controlled gout 02/24/2015  . Depression, major, recurrent, mild ([place]) 02/24/2015  . Morbid obesity due to excess calories ([place]) 02/24/2015  . Mild pulmonary hypertension 02/24/2015  . Gastroesophageal reflux disease without esophagitis 02/24/2015  . Perennial allergic rhinitis 02/24/2015  . HLD (hyperlipidemia) 02/20/2015  . Hypertension, benign 02/20/2015  . Apnea, sleep 02/20/2015  . Controlled diabetes mellitus with stage 3 chronic kidney disease, without long-term current use of  insulin ([place]) 02/20/2015    Past Surgical History:  Procedure Laterality Date  . ABDOMINAL HYSTERECTOMY     total  . CATARACT EXTRACTION    . COLONOSCOPY  01/2014   sigmoid diverticulosis. desc colon TA, hyperplastic polyp  . CORONARY ANGIOPLASTY WITH STENT PLACEMENT    . ESOPHAGOGASTRODUODENOSCOPY N/A 02/16/2015   negative h pylori, focal gastic intestinal  metaplasia  . TOTAL KNEE ARTHROPLASTY Right     Family History  Problem Relation Age of Onset  . Cancer Sister     unsure  . Alcohol abuse Brother   . Hypertension Mother   . Diabetes Mother   . Heart disease Mother   . Diabetes Father   . Heart disease Father   . Hypertension Father   . Colon cancer Neg Hx   . Liver disease Neg Hx   . Breast cancer Neg Hx     Social History   Social History  . Marital status: Married    Spouse name: N/A  . Number of children: N/A  . Years of education: N/A   Occupational History  . Not on file.   Social History Main Topics  . Smoking status: Former Smoker    Packs/day: 1.00    Years: 40.00    Types: Cigarettes    Quit date: 12/30/2012  . Smokeless tobacco: Never Used  . Alcohol use No  . Drug use: No  . Sexual activity: Not Currently   Other Topics Concern  . Not on file   Social History Narrative  . No narrative on file     Current Outpatient Prescriptions:  .  ACCU-CHEK FASTCLIX LANCETS MISC, , Disp: , Rfl:  .  ACCU-CHEK SMARTVIEW test strip, , Disp: , Rfl:  .  Alcohol Swabs (B-D SINGLE USE SWABS REGULAR) PADS, 1 each by Does not apply route 4 (four) times daily., Disp: 200 each, Rfl: 2 .  allopurinol (ZYLOPRIM) 100 MG tablet, Take 1 tablet (100 mg total) by mouth daily., Disp: 90 tablet, Rfl: 3 .  alum & mag hydroxide-simeth (MAALOX/MYLANTA) 200-200-20 MG/5ML suspension, Take 30 mLs by mouth every 6 (six) hours as needed for indigestion or heartburn., Disp: 355 mL, Rfl: 0 .  amLODipine (NORVASC) 5 MG tablet, Take 1 tablet (5 mg total) by mouth daily., Disp: 90 tablet, Rfl: 3 .  aspirin EC 81 MG tablet, Take 81 mg by mouth every morning. , Disp: , Rfl:  .  blood glucose meter kit and supplies KIT, Dispense based on patient and insurance preference. Use up to four times daily as directed. (FOR ICD-9 250.00, 250.01)., Disp: 1 each, Rfl: 0 .  citalopram (CELEXA) 20 MG tablet, Take 1 tablet (20 mg total) by mouth daily., Disp:  90 tablet, Rfl: 3 .  Coenzyme Q10 (COQ10) 100 MG CAPS, Take 1 capsule by mouth daily., Disp: 90 each, Rfl: 1 .  cyclobenzaprine (FLEXERIL) 5 MG tablet, Begin only at night time, as may cause drowsiness. 1 tab twice a day for muscles spasm, Disp: , Rfl:  .  Ferrous Sulfate (IRON) 325 (65 Fe) MG TABS, Take by mouth. Petra Kuba made brand-  Pt takes one tablet twice a day, Disp: , Rfl:  .  Ferrous Sulfate (IRON) 325 (65 Fe) MG TABS, , Disp: , Rfl:  .  fexofenadine (ALLEGRA) 180 MG tablet, Take 180 mg by mouth daily as needed for allergies. , Disp: , Rfl:  .  fluticasone (FLONASE) 50 MCG/ACT nasal spray, Place 2 sprays into both nostrils daily., Disp: 48  g, Rfl: 1 .  Insulin Pen Needle (PEN NEEDLES) 31G X 6 MM MISC, Use for insulin pen, Disp: 100 each, Rfl: 2 .  isosorbide mononitrate (IMDUR) 60 MG 24 hr tablet, Take 1 tablet (60 mg total) by mouth daily., Disp: 90 tablet, Rfl: 1 .  lisinopril (PRINIVIL,ZESTRIL) 40 MG tablet, Take 1 tablet (40 mg total) by mouth daily., Disp: 90 tablet, Rfl: 3 .  metoprolol succinate (TOPROL-XL) 50 MG 24 hr tablet, Take 1 tablet (50 mg total) by mouth daily., Disp: 90 tablet, Rfl: 3 .  pantoprazole (PROTONIX) 40 MG tablet, Take 1 tablet (40 mg total) by mouth every morning., Disp: 90 tablet, Rfl: 1 .  rosuvastatin (CRESTOR) 20 MG tablet, Take 1 tablet (20 mg total) by mouth daily., Disp: 90 tablet, Rfl: 3 .  sitaGLIPtin-metformin (JANUMET) 50-1000 MG tablet, Take 1 tablet by mouth 2 (two) times daily with a meal., Disp: 180 tablet, Rfl: 1 .  triamcinolone cream (KENALOG) 0.1 %, Apply 1 application topically 2 (two) times daily., Disp: 30 g, Rfl: 0 .  Insulin Degludec (TRESIBA FLEXTOUCH) 200 UNIT/ML SOPN, Inject 50 Units into the skin daily. In place of Levemir, Disp: 18 pen, Rfl: 1  Allergies  Allergen Reactions  . Codeine Other (See Comments)    Unknown reaction  . Contrast Media [Iodinated Diagnostic Agents] Itching  . Sulfa Antibiotics Itching      ROS  Constitutional: Negative for fever or weight change.  Respiratory: Negative for cough , positive for mild shortness of breath.   Cardiovascular: Negative for chest pain or palpitations.  Gastrointestinal: Negative for abdominal pain, no bowel changes.  Musculoskeletal: Negative for gait problem or joint swelling.  Skin: Negative for rash.  Neurological: Negative for dizziness or headache.  No other specific complaints in a complete review of systems (except as listed in HPI above).  Objective  Vitals:   11/10/16 1316  BP: 130/80  Pulse: 94  Resp: 16  Temp: 97.9 F (36.6 C)  TempSrc: Oral  SpO2: 96%  Weight: 221 lb 6.4 oz (100.4 kg)  Height: _0  (1.676 m)    Body mass index is 35.73 kg/m.  Physical Exam  Constitutional: Patient appears well-developed and well-nourished. Obese  No distress.  HEENT: head atraumatic, normocephalic, pupils equal and reactive to light, neck supple, throat within normal limits Cardiovascular: Normal rate, regular rhythm and normal heart sounds.  No murmur heard. No BLE edema. Pulmonary/Chest: Effort normal and breath sounds normal. No respiratory distress. Abdominal: Soft.  There is no tenderness. Psychiatric: Patient has a normal mood and affect. behavior is normal. Judgment and thought content normal. Muscular Skeletal: strength is 5/5 on both hands, but weaker on left when compared to right, some crepitus with extension of her knees. Balance is okay, but not able to stand on one foot for long  Recent Results (from the past 2160 hour(s))  POCT HgB A1C     Status: None   Collection Time: 11/10/16  1:22 PM  Result Value Ref Range   Hemoglobin A1C 7.7      PHQ2/9: Depression screen [facility] 2/9 07/17/2016 06/13/2016 05/11/2016 04/26/2016 03/13/2016  Decreased Interest 0 0 1 0 0  Down, Depressed, Hopeless 0 0 0 0 0  PHQ - 2 Score 0 0 1 0 0     Fall Risk: Fall Risk  07/17/2016 06/13/2016 05/11/2016 04/26/2016 03/13/2016  Falls in the  past year? _1      Assessment & Plan  1. Uncontrolled type 2  diabetes mellitus with gastroparesis (HCC)  - POCT HgB A1C - Insulin Degludec (TRESIBA FLEXTOUCH) 200 UNIT/ML SOPN; Inject 50 Units into the skin daily. In place of Levemir  Dispense: 18 pen; Refill: 1 Fasting glucose is not at goal, we will change from Levemir to [place] since fasting glucose is not at goal   2. Uncontrolled type 2 diabetes mellitus with microalbuminuria, with long-term current use of insulin (HCC)  - Insulin Degludec (TRESIBA FLEXTOUCH) 200 UNIT/ML SOPN; Inject 50 Units into the skin daily. In place of Levemir  Dispense: 18 pen; Refill: 1  3. Angina pectoris (HCC)  - isosorbide mononitrate (IMDUR) 60 MG 24 hr tablet; Take 1 tablet (60 mg total) by mouth daily.  Dispense: 90 tablet; Refill: 1  4. Depression, major, recurrent, mild ([place])  stable  5.  Obstructive sleep apnea syndrome  Compliant with CPAP   6. Hypertension, benign  Doing well, bp is at goal   7. Morbid obesity due to excess calories [facility])  Discussed with the patient the risk posed by an increased BMI. Discussed importance of portion control, calorie counting and at least 150 minutes of physical activity weekly. Avoid sweet beverages and drink more water. Eat at least 6 servings of fruit and vegetables daily    8. Need for pneumococcal vaccination  - Pneumococcal polysaccharide vaccine 23-valent greater than or equal to 2yo subcutaneous/IM  9. Low TSH level  - TSH  10. Other iron deficiency anemia  - CBC with Differential/Platelet - Iron, TIBC and Ferritin Panel  11. Left hand weakness  From work related injury many years ago, affects strength on left side and also fine motors skills.

## 2016-11-13 ENCOUNTER — Telehealth: Payer: Self-pay | Admitting: Family Medicine

## 2016-11-13 DIAGNOSIS — N183 Chronic kidney disease, stage 3 (moderate): Secondary | ICD-10-CM

## 2016-11-13 DIAGNOSIS — E11618 Type 2 diabetes mellitus with other diabetic arthropathy: Secondary | ICD-10-CM

## 2016-11-13 DIAGNOSIS — M7500 Adhesive capsulitis of unspecified shoulder: Principal | ICD-10-CM

## 2016-11-13 DIAGNOSIS — E1122 Type 2 diabetes mellitus with diabetic chronic kidney disease: Secondary | ICD-10-CM

## 2016-11-13 DIAGNOSIS — E1143 Type 2 diabetes mellitus with diabetic autonomic (poly)neuropathy: Secondary | ICD-10-CM

## 2016-11-13 NOTE — Telephone Encounter (Signed)
States that you had taken her off levemir and put her on Tresebia, however it will put her in the doughnut hole. She called [employer] and they suggested Dr Ancil Boozer do [facility] and to switch the patient to Metformin ER 500mg . Please send the script to [facility]. In the meantime patient would like to know what can she take until she receive the script. Please return call [phone]

## 2016-11-13 NOTE — Telephone Encounter (Signed)
Insulin N is not safe, I don't feel comfortable changing to that. She can try contacting [employer] to see if she can get it for free through the patient assistance program

## 2016-11-14 ENCOUNTER — Other Ambulatory Visit: Payer: Self-pay | Admitting: Family Medicine

## 2016-11-14 MED ORDER — GLIPIZIDE ER 10 MG PO TB24: 10.0000 mg | ORAL_TABLET | Freq: Every day | ORAL | 0 refills | Status: DC

## 2016-11-14 NOTE — Telephone Encounter (Signed)
She is to continue Janumet, and start Glipizide 10 XL in am's, monitor glucose and keep a diabetic diet.  Glipizide is dangerous in her age group because it can cause hypoglycemia, but is generic and cheap.  We can recheck labs in 4 months and if elevated refer to endocrinologist.  She needs to be strict with her diet.

## 2016-11-14 NOTE — Telephone Encounter (Signed)
Patient states she can not get Chiropractor until she is completely in donut hole. Patient states the [employer] Aas has drawn her account into the negative $140. Patient states [employer] charged her account $600 a month, and they are going to send the medication and back. Waiting on money to go back in account since she has not received medication yet. Spoke to Dr. Ancil Boozer and states all the insulins will cost, so she will switch to all oral medications and make a referral to endocrinology. Please print medication and she will be by in the morning for her paperwork. Disability date was May 08, 2000.

## 2016-11-15 NOTE — Telephone Encounter (Signed)
Patient notified

## 2016-12-07 DIAGNOSIS — G4733 Obstructive sleep apnea (adult) (pediatric): Secondary | ICD-10-CM | POA: Diagnosis not present

## 2016-12-15 DIAGNOSIS — E785 Hyperlipidemia, unspecified: Secondary | ICD-10-CM | POA: Diagnosis not present

## 2016-12-15 DIAGNOSIS — I10 Essential (primary) hypertension: Secondary | ICD-10-CM | POA: Diagnosis not present

## 2016-12-15 DIAGNOSIS — E1165 Type 2 diabetes mellitus with hyperglycemia: Secondary | ICD-10-CM | POA: Diagnosis not present

## 2016-12-15 DIAGNOSIS — E1142 Type 2 diabetes mellitus with diabetic polyneuropathy: Secondary | ICD-10-CM | POA: Diagnosis not present

## 2016-12-15 DIAGNOSIS — E1159 Type 2 diabetes mellitus with other circulatory complications: Secondary | ICD-10-CM | POA: Diagnosis not present

## 2016-12-25 DIAGNOSIS — B351 Tinea unguium: Secondary | ICD-10-CM | POA: Diagnosis not present

## 2016-12-25 DIAGNOSIS — E1142 Type 2 diabetes mellitus with diabetic polyneuropathy: Secondary | ICD-10-CM | POA: Diagnosis not present

## 2016-12-25 DIAGNOSIS — L851 Acquired keratosis [keratoderma] palmaris et plantaris: Secondary | ICD-10-CM | POA: Diagnosis not present

## 2017-01-03 ENCOUNTER — Other Ambulatory Visit: Payer: Self-pay | Admitting: Family Medicine

## 2017-01-03 DIAGNOSIS — K219 Gastro-esophageal reflux disease without esophagitis: Secondary | ICD-10-CM

## 2017-01-04 NOTE — Telephone Encounter (Signed)
Patient requesting refill of Glipizide and Pantoprazole to [facility].

## 2017-01-19 ENCOUNTER — Emergency Department: Payer: Medicare HMO

## 2017-01-19 ENCOUNTER — Encounter: Payer: Self-pay | Admitting: Family Medicine

## 2017-01-19 ENCOUNTER — Encounter: Payer: Self-pay | Admitting: Radiology

## 2017-01-19 ENCOUNTER — Emergency Department
Admission: EM | Admit: 2017-01-19 | Discharge: 2017-01-19 | Disposition: A | Discharge status: Home / Self Care | Payer: Medicare HMO | Attending: Emergency Medicine | Admitting: Emergency Medicine

## 2017-01-19 ENCOUNTER — Ambulatory Visit (INDEPENDENT_AMBULATORY_CARE_PROVIDER_SITE_OTHER): Payer: Medicare HMO | Admitting: Family Medicine

## 2017-01-19 VITALS — BP 132/86 | HR 76 | Temp 97.3°F | Resp 16 | Ht 65.5 in | Wt 231.6 lb

## 2017-01-19 DIAGNOSIS — Z79899 Other long term (current) drug therapy: Secondary | ICD-10-CM | POA: Diagnosis not present

## 2017-01-19 DIAGNOSIS — Z87891 Personal history of nicotine dependence: Secondary | ICD-10-CM | POA: Diagnosis not present

## 2017-01-19 DIAGNOSIS — R5383 Other fatigue: Secondary | ICD-10-CM

## 2017-01-19 DIAGNOSIS — I209 Angina pectoris, unspecified: Secondary | ICD-10-CM

## 2017-01-19 DIAGNOSIS — I2510 Atherosclerotic heart disease of native coronary artery without angina pectoris: Secondary | ICD-10-CM | POA: Insufficient documentation

## 2017-01-19 DIAGNOSIS — R079 Chest pain, unspecified: Secondary | ICD-10-CM

## 2017-01-19 DIAGNOSIS — R6889 Other general symptoms and signs: Secondary | ICD-10-CM | POA: Diagnosis not present

## 2017-01-19 DIAGNOSIS — I10 Essential (primary) hypertension: Secondary | ICD-10-CM | POA: Insufficient documentation

## 2017-01-19 DIAGNOSIS — R0789 Other chest pain: Secondary | ICD-10-CM | POA: Diagnosis not present

## 2017-01-19 DIAGNOSIS — Z7984 Long term (current) use of oral hypoglycemic drugs: Secondary | ICD-10-CM | POA: Diagnosis not present

## 2017-01-19 DIAGNOSIS — J449 Chronic obstructive pulmonary disease, unspecified: Secondary | ICD-10-CM | POA: Diagnosis not present

## 2017-01-19 DIAGNOSIS — E119 Type 2 diabetes mellitus without complications: Secondary | ICD-10-CM | POA: Insufficient documentation

## 2017-01-19 DIAGNOSIS — R0602 Shortness of breath: Secondary | ICD-10-CM | POA: Insufficient documentation

## 2017-01-19 DIAGNOSIS — R61 Generalized hyperhidrosis: Secondary | ICD-10-CM

## 2017-01-19 DIAGNOSIS — Z7982 Long term (current) use of aspirin: Secondary | ICD-10-CM | POA: Diagnosis not present

## 2017-01-19 DIAGNOSIS — R531 Weakness: Secondary | ICD-10-CM | POA: Diagnosis not present

## 2017-01-19 LAB — BASIC METABOLIC PANEL
Anion gap: 5 (ref 5–15)
BUN: 16 mg/dL (ref 6–20)
CHLORIDE: 109 mmol/L (ref 101–111)
CO2: 24 mmol/L (ref 22–32)
CREATININE: 0.92 mg/dL (ref 0.44–1.00)
Calcium: 9.4 mg/dL (ref 8.9–10.3)
GFR calc Af Amer: >60 mL/min (ref >60)
GFR calc non Af Amer: >60 mL/min (ref >60)
GLUCOSE: 132 mg/dL — AB (ref 65–99)
Potassium: 4.3 mmol/L (ref 3.5–5.1)
Sodium: 138 mmol/L (ref 135–145)

## 2017-01-19 LAB — CBC
HEMATOCRIT: 39.1 % (ref 35.0–47.0)
HEMOGLOBIN: 13 g/dL (ref 12.0–16.0)
MCH: 30.6 pg (ref 26.0–34.0)
MCHC: 33.3 g/dL (ref 32.0–36.0)
MCV: 91.9 fL (ref 80.0–100.0)
Platelets: 303 10*3/uL (ref 150–440)
RBC: 4.26 MIL/uL (ref 3.80–5.20)
RDW: 15.6 % — ABNORMAL HIGH (ref 11.5–14.5)
WBC: 7 10*3/uL (ref 3.6–11.0)

## 2017-01-19 LAB — TROPONIN I: Troponin I: <0.03 ng/mL (ref <0.03)

## 2017-01-19 MED ORDER — ASPIRIN 81 MG PO CHEW
324.0000 mg | CHEWABLE_TABLET | Freq: Once | ORAL | Status: AC
  Administered 2017-01-19: 324 mg via ORAL

## 2017-01-19 MED ORDER — TECHNETIUM TC 99M DIETHYLENETRIAME-PENTAACETIC ACID
30.0000 | Freq: Once | INTRAVENOUS | Status: AC | PRN
  Administered 2017-01-19: 30.25 via INTRAVENOUS

## 2017-01-19 MED ORDER — TECHNETIUM TO 99M ALBUMIN AGGREGATED
4.0000 | Freq: Once | INTRAVENOUS | Status: AC | PRN
  Administered 2017-01-19: 3.639 via INTRAVENOUS

## 2017-01-19 NOTE — ED Provider Notes (Signed)
Dana Sparks  Time seen: 4:03 PM  I have reviewed the triage vital signs and the nursing notes.   HISTORY  Chief Complaint Chest Pain    HPI Dana Sparks is a 66 y.o. female with a past medical history of anxiety, COPD, diabetes, hyperlipidemia, hypertension, presents to the emergency department for central chest discomfort. According to the patient she normally walks 30 laps every day. She states starting this morning she has felt central chest discomfort, shortness of breath with exertion. States the chest does not hurt and she pushes on it which causes her significant pain. Denies any pleuritic pain. Patient does state mild right leg pain but states this is chronic since a knee surgery years ago. Denies any leg swelling. Patient states her main concern is the shortness of breath with exertion. She states yesterday she was able to walk 30 laps at the gym and today she was only able to walk to labs before becoming short of breath. Denies any recent illnesses, cough congestion, fever, etc.  Past Medical History:  Diagnosis Date  . Allergy   . Anxiety   . Arthritis   . CAD (coronary artery disease)   . COPD (chronic obstructive pulmonary disease) ([place])   . Depression   . Diabetes mellitus without complication ([place])   . Diverticulitis   . Gastroparesis   . Gout   . Hyperlipemia   . Hypertension   . Positive H. pylori test   . Renal insufficiency   . Sleep apnea   . Thrombocytosis ([place]) 01/28/2015  . Tubular adenoma of colon 01/20/14    Patient Active Problem List   Diagnosis Date Noted  . Left hand weakness 10/12/2016  . Elevated antinuclear antibody (ANA) level 04/27/2016  . Angina pectoris ([place]) 11/01/2015  . Well controlled type 2 diabetes mellitus with gastroparesis ([place]) 06/22/2015  . Low TSH level 06/22/2015  . Iron deficiency anemia 04/07/2015  . Intestinal metaplasia of gastric mucosa 03/11/2015  . CAD (coronary  artery disease), native coronary artery 02/24/2015  . History of coronary artery stent placement 02/24/2015  . Chronic kidney disease, stage 3, mod decreased GFR 02/24/2015  . Menopause 02/24/2015  . History of Helicobacter pylori infection 02/24/2015  . Mild mitral insufficiency 02/24/2015  . Mild tricuspid insufficiency 02/24/2015  . 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 ([place]) 02/24/2015  . Controlled gout 02/24/2015  . Depression, major, recurrent, mild ([place]) 02/24/2015  . Morbid obesity due to excess calories ([place]) 02/24/2015  . Mild pulmonary hypertension ([place]) 02/24/2015  . Gastroesophageal reflux disease without esophagitis 02/24/2015  . Perennial allergic rhinitis 02/24/2015  . HLD (hyperlipidemia) 02/20/2015  . Hypertension, benign 02/20/2015  . Apnea, sleep 02/20/2015  . Controlled diabetes mellitus with stage 3 chronic kidney disease, without long-term current use of insulin ([place]) 02/20/2015    Past Surgical History:  Procedure Laterality Date  . ABDOMINAL HYSTERECTOMY     total  . CATARACT EXTRACTION    . COLONOSCOPY  01/2014   sigmoid diverticulosis. desc colon TA, hyperplastic polyp  . CORONARY ANGIOPLASTY WITH STENT PLACEMENT    . ESOPHAGOGASTRODUODENOSCOPY N/A 02/16/2015   negative h pylori, focal gastic intestinal metaplasia  . TOTAL KNEE ARTHROPLASTY Right     Prior to Admission medications   Medication Sig Start Date End Date Taking? Authorizing Provider  ACCU-CHEK FASTCLIX LANCETS [place]  06/06/16   [provider]  ACCU-CHEK SMARTVIEW test strip  06/06/16   [provider]  Alcohol Swabs (B-D SINGLE USE SWABS REGULAR) PADS 1 each by Does not apply route 4 (four) times daily. 06/11/16   Steele Sizer, MD  allopurinol (ZYLOPRIM) 100 MG tablet Take 1 tablet (100 mg total) by mouth daily. 10/03/16   Steele Sizer, MD  alum & mag hydroxide-simeth (MAALOX/MYLANTA) 200-200-20 MG/5ML suspension Take 30 mLs by mouth every 6  (six) hours as needed for indigestion or heartburn. 07/17/16   Steele Sizer, MD  amLODipine (NORVASC) 5 MG tablet Take 1 tablet (5 mg total) by mouth daily. 10/03/16   Steele Sizer, MD  aspirin EC 81 MG tablet Take 81 mg by mouth every morning.     [provider]  blood glucose meter kit and supplies KIT Dispense based on patient and insurance preference. Use up to four times daily as directed. (FOR ICD-9 250.00, 250.01). 05/02/16   Gladstone Lighter, MD  citalopram (CELEXA) 20 MG tablet Take 1 tablet (20 mg total) by mouth daily. 10/03/16   Steele Sizer, MD  Coenzyme Q10 (COQ10) 100 MG CAPS Take 1 capsule by mouth daily. 04/26/16   Steele Sizer, MD  cyclobenzaprine (FLEXERIL) 5 MG tablet Begin only at night time, as may cause drowsiness. 1 tab twice a day for muscles spasm 08/07/16   [provider]  Ferrous Sulfate (IRON) 325 (65 Fe) MG TABS Take by mouth. Nature made brand-  Pt takes one tablet twice a day    [provider]  fexofenadine (ALLEGRA) 180 MG tablet Take 180 mg by mouth daily as needed for allergies.     [provider]  fluticasone (FLONASE) 50 MCG/ACT nasal spray Place 2 sprays into both nostrils daily. 10/03/16   Sowles, Drue Stager, MD  glipiZIDE (GLUCOTROL XL) 10 MG 24 hr tablet TAKE 1 TABLET EVERY DAY WITH BREAKFAST 01/04/17   Sowles, Drue Stager, MD  insulin NPH Human (HUMULIN N,NOVOLIN N) 100 UNIT/ML injection Inject 15-20 Units into the skin 2 (two) times daily before a meal. 15 units in the morning and 20 units at bedtime    Solum, Betsey Holiday, MD  Insulin Pen Needle (PEN NEEDLES) 31G X 6 MM MISC Use for insulin pen 07/17/16   Steele Sizer, MD  Insulin Syringe-Needle U-100 (INSULIN SYRINGE .5CC/31GX5/16") 31G X 5/16" 0.5 ML MISC Use as directed. Use two times daily. 12/15/16 12/15/17  [provider]  isosorbide mononitrate (IMDUR) 60 MG 24 hr tablet Take 1 tablet (60 mg total) by mouth daily. 11/10/16   Steele Sizer, MD  lisinopril  (PRINIVIL,ZESTRIL) 40 MG tablet Take 1 tablet (40 mg total) by mouth daily. 10/03/16   Steele Sizer, MD  metFORMIN (GLUCOPHAGE-XR) 500 MG 24 hr tablet Take 4 tablets by mouth daily with supper. 12/15/16 12/15/17  [provider]  metoprolol succinate (TOPROL-XL) 50 MG 24 hr tablet Take 1 tablet (50 mg total) by mouth daily. 10/03/16   Steele Sizer, MD  pantoprazole (PROTONIX) 40 MG tablet TAKE 1 TABLET EVERY MORNING 01/04/17   Ancil Boozer, Drue Stager, MD  rosuvastatin (CRESTOR) 20 MG tablet Take 1 tablet (20 mg total) by mouth daily. 10/03/16   Steele Sizer, MD    Allergies  Allergen Reactions  . Codeine Other (See Comments)    Unknown reaction  . Contrast Media [Iodinated Diagnostic Agents] Itching  . Sulfa Antibiotics Itching    Family History  Problem Relation Age of Onset  . Cancer Sister        unsure  . Alcohol abuse Brother   . Hypertension Mother   . Diabetes Mother   .  Heart disease Mother   . Diabetes Father   . Heart disease Father   . Hypertension Father   . Colon cancer Neg Hx   . Liver disease Neg Hx   . Breast cancer Neg Hx     Social History Social History  Substance Use Topics  . Smoking status: Former Smoker    Packs/day: 1.00    Years: 40.00    Types: Cigarettes    Quit date: 12/30/2012  . Smokeless tobacco: Never Used  . Alcohol use No    Review of Systems Constitutional: Negative for fever. Eyes: Negative for visual changes. ENT: Negative for congestion Cardiovascular: Positive for central chest pain. Worse with palpation. Respiratory: No shortness of breath at rest, positive for shortness of breath with exertion. Gastrointestinal: Negative for abdominal pain, vomiting and diarrhea. Musculoskeletal: Mild right leg pain, chronic. No lower extremity edema. Skin: Negative for rash. Neurological: Negative for headache All other ROS negative  ____________________________________________   PHYSICAL EXAM:  VITAL SIGNS: ED Triage Vitals   Enc Vitals Group     BP 01/19/17 1241 (!) 148/75     Pulse Rate 01/19/17 1241 63     Resp 01/19/17 1241 17     Temp 01/19/17 1241 97.5 F (36.4 C)     Temp Source 01/19/17 1241 Oral     SpO2 01/19/17 1241 99 %     Weight --      Height --      Head Circumference --      Peak Flow --      Pain Score 01/19/17 1244 0     Pain Loc --      Pain Edu? --      Excl. in [place]? --     Constitutional: Alert and oriented. Well appearing and in no distress. Eyes: Normal exam ENT   Head: Normocephalic and atraumatic   Mouth/Throat: Mucous membranes are moist. Cardiovascular: Normal rate, regular rhythm. No murmur Respiratory: Normal respiratory effort without tachypnea nor retractions. Breath sounds are clear. Moderate central chest tenderness to palpation especially over the sternum. Gastrointestinal: Soft and nontender. No distention. Musculoskeletal: Nontender with normal range of motion in all extremities. Mild right calf tenderness, no appreciable edema. Neurovascularly intact. Neurologic:  Normal speech and language. No gross focal neurologic deficits Skin:  Skin is warm, dry and intact.  Psychiatric: Mood and affect are normal.   ____________________________________________    EKG  EKG reviewed and interpreted by myself shows normal sinus rhythm at 63 bpm, narrow QRS, normal axis, normal intervals, no concerning ST changes.  ____________________________________________    RADIOLOGY  Chest x-ray shows mild peribronchial thickening.  ____________________________________________   INITIAL IMPRESSION / ASSESSMENT AND PLAN / ED COURSE  Pertinent labs & imaging results that were available during my care of the patient were reviewed by me and considered in my medical decision making (see chart for details).  Patient presents the emergency department for chest wall pain, starting this morning. Patient states no chest pain at rest however significant pain with any palpation  especially over the sternum. However somewhat more concerning with the patient states significant shortness of breath with exertion which is new compared to yesterday. Patient does have a history of cardiac stents in the past but states they were placed many years ago for angina type pain. Patient sees Dr. Clayborn Bigness.  Given the patient's acute onset of dyspnea with exertion as well as central chest pain and believe the patient is in need of imaging to rule  out pulmonary embolism area patient states she has an IV dye allergy. We'll attempt to obtain a VQ scan. We will also obtain a repeat troponin.  Repeat troponin is negative. VQ scan shows low probability for PE. Patient continues to appear well in the emergency department with a reproducible pain. Will follow up with her cardiologist. We will discharge home.  ____________________________________________   FINAL CLINICAL IMPRESSION(S) / ED DIAGNOSES  Chest pain Shortness of breath with exertion    Harvest Dark, MD 01/19/17 2125

## 2017-01-19 NOTE — ED Notes (Addendum)
Per Dr. Kerman Passey, pt does not need to be on continuous cardiac monitoring during scan.

## 2017-01-19 NOTE — Progress Notes (Signed)
Name: Dana Sparks   MRN: [id number]    DOB: 1951-06-01   Date:01/19/2017       Progress Note  Subjective  Chief Complaint  Chief Complaint  Patient presents with  . Shortness of Breath    Patient states this morning she started feeling sob and her sugar has been 338. Seeing Dr. Gabriel Carina for her sugar. When she walks you have chest discomfort, headaches.    HPI  SOB with activity: she states she has been walking 6 days per week  for the past 7 weeks, however this morning while walking around the track at [facility] she developed SOB, chest tightness , diaphoresis, but no nausea or vomiting. She stopped walking and symptoms improved , symptoms lasted about 5 minutes, but returned when she got home and walked to the mail box. Glucose was high after episode. She is still feeling tired, SOB with activity. She tried using an old Qvar without improvement of symptoms.   Patient Active Problem List   Diagnosis Date Noted  . Left hand weakness 10/12/2016  . Elevated antinuclear antibody (ANA) level 04/27/2016  . Angina pectoris ([place]) 11/01/2015  . Well controlled type 2 diabetes mellitus with gastroparesis ([place]) 06/22/2015  . Low TSH level 06/22/2015  . Iron deficiency anemia 04/07/2015  . Intestinal metaplasia of gastric mucosa 03/11/2015  . CAD (coronary artery disease), native coronary artery 02/24/2015  . History of coronary artery stent placement 02/24/2015  . Chronic kidney disease, stage 3, mod decreased GFR 02/24/2015  . Menopause 02/24/2015  . History of Helicobacter pylori infection 02/24/2015  . Mild mitral insufficiency 02/24/2015  . Mild tricuspid insufficiency 02/24/2015  . 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 Controlled gout 02/24/2015  . Depression, major, recurrent, mild ([place]) 02/24/2015  . Morbid obesity due to excess calories ([place]) 02/24/2015  . Mild pulmonary hypertension ([place]) 02/24/2015  . Gastroesophageal reflux disease  without esophagitis 02/24/2015  . Perennial allergic rhinitis 02/24/2015  . HLD (hyperlipidemia) 02/20/2015  . Hypertension, benign 02/20/2015  . Apnea, sleep 02/20/2015  . Controlled diabetes mellitus with stage 3 chronic kidney disease, without long-term current use of insulin ([place]) 02/20/2015    Past Surgical History:  Procedure Laterality Date  . ABDOMINAL HYSTERECTOMY     total  . CATARACT EXTRACTION    . COLONOSCOPY  01/2014   sigmoid diverticulosis. desc colon TA, hyperplastic polyp  . CORONARY ANGIOPLASTY WITH STENT PLACEMENT    . ESOPHAGOGASTRODUODENOSCOPY N/A 02/16/2015   negative h pylori, focal gastic intestinal metaplasia  . TOTAL KNEE ARTHROPLASTY Right     Family History  Problem Relation Age of Onset  . Cancer Sister        unsure  . Alcohol abuse Brother   . Hypertension Mother   . Diabetes Mother   . Heart disease Mother   . Diabetes Father   . Heart disease Father   . Hypertension Father   . Colon cancer Neg Hx   . Liver disease Neg Hx   . Breast cancer Neg Hx     Social History   Social History  . Marital status: Married    Spouse name: N/A  . Number of children: N/A  . Years of education: N/A   Occupational History  . Not on file.   Social History Main Topics  . Smoking status: Former Smoker    Packs/day: 1.00    Years: 40.00    Types: Cigarettes    Quit date: 12/30/2012  .  Smokeless tobacco: Never Used  . Alcohol use No  . Drug use: No  . Sexual activity: Not Currently   Other Topics Concern  . Not on file   Social History Narrative  . No narrative on file     Current Outpatient Prescriptions:  .  ACCU-CHEK FASTCLIX LANCETS MISC, , Disp: , Rfl:  .  ACCU-CHEK SMARTVIEW test strip, , Disp: , Rfl:  .  Alcohol Swabs (B-D SINGLE USE SWABS REGULAR) PADS, 1 each by Does not apply route 4 (four) times daily., Disp: 200 each, Rfl: 2 .  allopurinol (ZYLOPRIM) 100 MG tablet, Take 1 tablet (100 mg total) by mouth daily., Disp: 90 tablet,  Rfl: 3 .  alum & mag hydroxide-simeth (MAALOX/MYLANTA) 200-200-20 MG/5ML suspension, Take 30 mLs by mouth every 6 (six) hours as needed for indigestion or heartburn., Disp: 355 mL, Rfl: 0 .  amLODipine (NORVASC) 5 MG tablet, Take 1 tablet (5 mg total) by mouth daily., Disp: 90 tablet, Rfl: 3 .  aspirin EC 81 MG tablet, Take 81 mg by mouth every morning. , Disp: , Rfl:  .  blood glucose meter kit and supplies KIT, Dispense based on patient and insurance preference. Use up to four times daily as directed. (FOR ICD-9 250.00, 250.01)., Disp: 1 each, Rfl: 0 .  citalopram (CELEXA) 20 MG tablet, Take 1 tablet (20 mg total) by mouth daily., Disp: 90 tablet, Rfl: 3 .  Coenzyme Q10 (COQ10) 100 MG CAPS, Take 1 capsule by mouth daily., Disp: 90 each, Rfl: 1 .  cyclobenzaprine (FLEXERIL) 5 MG tablet, Begin only at night time, as may cause drowsiness. 1 tab twice a day for muscles spasm, Disp: , Rfl:  .  Ferrous Sulfate (IRON) 325 (65 Fe) MG TABS, Take by mouth. Nature made brand-  Pt takes one tablet twice a day, Disp: , Rfl:  .  fexofenadine (ALLEGRA) 180 MG tablet, Take 180 mg by mouth daily as needed for allergies. , Disp: , Rfl:  .  fluticasone (FLONASE) 50 MCG/ACT nasal spray, Place 2 sprays into both nostrils daily., Disp: 48 g, Rfl: 1 .  glipiZIDE (GLUCOTROL XL) 10 MG 24 hr tablet, TAKE 1 TABLET EVERY DAY WITH BREAKFAST, Disp: 90 tablet, Rfl: 1 .  insulin NPH Human (HUMULIN N,NOVOLIN N) 100 UNIT/ML injection, Inject 15-20 Units into the skin 2 (two) times daily before a meal. 15 units in the morning and 20 units at bedtime, Disp: , Rfl:  .  Insulin Pen Needle (PEN NEEDLES) 31G X 6 MM MISC, Use for insulin pen, Disp: 100 each, Rfl: 2 .  Insulin Syringe-Needle U-100 (INSULIN SYRINGE .5CC/31GX5/16") 31G X 5/16" 0.5 ML MISC, Use as directed. Use two times daily., Disp: , Rfl:  .  isosorbide mononitrate (IMDUR) 60 MG 24 hr tablet, Take 1 tablet (60 mg total) by mouth daily., Disp: 90 tablet, Rfl: 1 .   lisinopril (PRINIVIL,ZESTRIL) 40 MG tablet, Take 1 tablet (40 mg total) by mouth daily., Disp: 90 tablet, Rfl: 3 .  metFORMIN (GLUCOPHAGE-XR) 500 MG 24 hr tablet, Take 4 tablets by mouth daily with supper., Disp: , Rfl:  .  metoprolol succinate (TOPROL-XL) 50 MG 24 hr tablet, Take 1 tablet (50 mg total) by mouth daily., Disp: 90 tablet, Rfl: 3 .  pantoprazole (PROTONIX) 40 MG tablet, TAKE 1 TABLET EVERY MORNING, Disp: 90 tablet, Rfl: 1 .  rosuvastatin (CRESTOR) 20 MG tablet, Take 1 tablet (20 mg total) by mouth daily., Disp: 90 tablet, Rfl: 3  Allergies  Allergen Reactions  .  Codeine Other (See Comments)    Unknown reaction  . Contrast Media [Iodinated Diagnostic Agents] Itching  . Sulfa Antibiotics Itching     ROS  Ten systems reviewed and is negative except as mentioned in HPI  Mild wheezing yesterday, no cough, no edema  Objective  Vitals:   01/19/17 1127  BP: 132/86  Pulse: 76  Resp: 16  Temp: 97.3 F (36.3 C)  TempSrc: Oral  SpO2: 96%  Weight: 231 lb 9.6 oz (105.1 kg)  Height: 5' 5.5" (1.664 m)    Body mass index is 37.95 kg/m.  Physical Exam  Constitutional: Patient appears well-developed and well-nourished. Obese No distress.  HEENT: head atraumatic, normocephalic, pupils equal and reactive to light,  neck supple, throat within normal limits Cardiovascular: Normal rate, regular rhythm and normal heart sounds.  No murmur heard. No BLE edema. Pulmonary/Chest: Effort normal and breath sounds normal. No respiratory distress. Abdominal: Soft.  There is no tenderness. Psychiatric: Patient has a normal mood and affect. behavior is normal. Judgment and thought content normal.  Recent Results (from the past 2160 hour(s))  POCT HgB A1C     Status: None   Collection Time: 11/10/16  1:22 PM  Result Value Ref Range   Hemoglobin A1C 7.7   CBC with Differential/Platelet     Status: Abnormal   Collection Time: 11/10/16  2:07 PM  Result Value Ref Range   WBC 8.7 3.8 -  10.8 K/uL   RBC 4.53 3.80 - 5.10 MIL/uL   Hemoglobin 13.4 11.7 - 15.5 g/dL   HCT 40.9 35.0 - 45.0 %   MCV 90.3 80.0 - 100.0 fL   MCH 29.6 27.0 - 33.0 pg   MCHC 32.8 32.0 - 36.0 g/dL   RDW 14.3 11.0 - 15.0 %   Platelets 352 140 - 400 K/uL   MPV 9.5 7.5 - 12.5 fL   Neutro Abs 3,828 1,500 - 7,800 cells/uL   Lymphs Abs 4,002 (H) 850 - 3,900 cells/uL   Monocytes Absolute 609 200 - 950 cells/uL   Eosinophils Absolute 261 15 - 500 cells/uL   Basophils Absolute 0 0 - 200 cells/uL   Neutrophils Relative % 44 %   Lymphocytes Relative 46 %   Monocytes Relative 7 %   Eosinophils Relative 3 %   Basophils Relative 0 %   Smear Review Criteria for review not met   Iron, TIBC and Ferritin Panel     Status: Abnormal   Collection Time: 11/10/16  2:07 PM  Result Value Ref Range   Ferritin 10 (L) 20 - 288 ng/mL   Iron 71 45 - 160 ug/dL   TIBC 387 250 - 450 ug/dL   %SAT 18 11 - 50 %  TSH     Status: None   Collection Time: 11/10/16  2:07 PM  Result Value Ref Range   TSH 0.40 mIU/L    Comment:   Reference Range   > or = 20 Years  0.40-4.50   Pregnancy Range First trimester  0.26-2.66 Second trimester 0.55-2.73 Third trimester  0.43-2.91        PHQ2/9: Depression screen [facility] 2/9 01/19/2017 07/17/2016 06/13/2016 05/11/2016 04/26/2016  Decreased Interest 0 0 0 1 0  Down, Depressed, Hopeless 0 0 0 0 0  PHQ - 2 Score 0 0 0 1 0     Fall Risk: Fall Risk  01/19/2017 07/17/2016 06/13/2016 05/11/2016 04/26/2016  Falls in the past year? Yes No No No No  Number falls in past yr: 1 - - - -  Injury with Fall? Yes - - - -    Functional Status Survey: Is the patient deaf or have difficulty hearing?: No Does the patient have difficulty seeing, even when wearing glasses/contacts?: No Does the patient have difficulty concentrating, remembering, or making decisions?: No Does the patient have difficulty walking or climbing stairs?: No Does the patient have difficulty dressing or bathing?: No Does the  patient have difficulty doing errands alone such as visiting a doctor's office or shopping?: No    Assessment & Plan  1. SOB (shortness of breath) on exertion  She has a history of heart disease, stent placement, she had diaphoresis. We gave her 81 mg ASA at 12:13 , normal EKG in our office, but high risk patient , we called 911 and they will transport her to [facility]   2. Diaphoresis   3. Other fatigue   4. Angina pectoris ([place])  - EKG 12-Lead

## 2017-01-19 NOTE — ED Notes (Signed)
Pt taken over for scan.

## 2017-01-19 NOTE — ED Notes (Signed)
Pt to await scan at about 1930. EDP notified patient. Pt given meal tray and drink.

## 2017-01-19 NOTE — ED Notes (Signed)
Pt alert and oriented X4, active, cooperative, pt in NAD. RR even and unlabored, color WNL.  Pt informed to return if any life threatening symptoms occur.   

## 2017-01-19 NOTE — ED Triage Notes (Signed)
Pt was walking at [facility] today and started having chest pain and shortness of breath. Pt reports diaphoretic episode, went home and got on her CPAP machine. Pt also reports CBG was 288. Pt sent here from PCP office.

## 2017-01-19 NOTE — Discharge Instructions (Signed)
You have been seen in the emergency department today for chest pain. Your workup has shown normal results. As we discussed please follow-up with your primary care physician in the next 1-2 days for recheck. Return to the emergency department for any further chest pain, trouble breathing, or any other symptom personally concerning to yourself. °

## 2017-01-26 DIAGNOSIS — I10 Essential (primary) hypertension: Secondary | ICD-10-CM | POA: Diagnosis not present

## 2017-01-26 DIAGNOSIS — E785 Hyperlipidemia, unspecified: Secondary | ICD-10-CM | POA: Diagnosis not present

## 2017-01-26 DIAGNOSIS — E1165 Type 2 diabetes mellitus with hyperglycemia: Secondary | ICD-10-CM | POA: Diagnosis not present

## 2017-01-26 DIAGNOSIS — E1142 Type 2 diabetes mellitus with diabetic polyneuropathy: Secondary | ICD-10-CM | POA: Diagnosis not present

## 2017-01-26 DIAGNOSIS — E1159 Type 2 diabetes mellitus with other circulatory complications: Secondary | ICD-10-CM | POA: Diagnosis not present

## 2017-01-26 DIAGNOSIS — E663 Overweight: Secondary | ICD-10-CM | POA: Diagnosis not present

## 2017-02-20 DIAGNOSIS — M24661 Ankylosis, right knee: Secondary | ICD-10-CM | POA: Diagnosis not present

## 2017-02-20 DIAGNOSIS — Z96651 Presence of right artificial knee joint: Secondary | ICD-10-CM | POA: Diagnosis not present

## 2017-02-27 ENCOUNTER — Encounter: Payer: Self-pay | Admitting: Family Medicine

## 2017-02-27 ENCOUNTER — Ambulatory Visit (INDEPENDENT_AMBULATORY_CARE_PROVIDER_SITE_OTHER): Payer: Medicare HMO | Admitting: Family Medicine

## 2017-02-27 ENCOUNTER — Telehealth: Payer: Self-pay

## 2017-02-27 VITALS — BP 112/74 | HR 73 | Temp 97.5°F | Resp 16 | Ht 66.0 in | Wt 230.0 lb

## 2017-02-27 DIAGNOSIS — I10 Essential (primary) hypertension: Secondary | ICD-10-CM

## 2017-02-27 DIAGNOSIS — Z1231 Encounter for screening mammogram for malignant neoplasm of breast: Secondary | ICD-10-CM

## 2017-02-27 DIAGNOSIS — G4733 Obstructive sleep apnea (adult) (pediatric): Secondary | ICD-10-CM

## 2017-02-27 DIAGNOSIS — N183 Chronic kidney disease, stage 3 (moderate): Secondary | ICD-10-CM

## 2017-02-27 DIAGNOSIS — R946 Abnormal results of thyroid function studies: Secondary | ICD-10-CM | POA: Diagnosis not present

## 2017-02-27 DIAGNOSIS — M791 Myalgia, unspecified site: Secondary | ICD-10-CM

## 2017-02-27 DIAGNOSIS — M109 Gout, unspecified: Secondary | ICD-10-CM | POA: Diagnosis not present

## 2017-02-27 DIAGNOSIS — I517 Cardiomegaly: Secondary | ICD-10-CM | POA: Insufficient documentation

## 2017-02-27 DIAGNOSIS — E1122 Type 2 diabetes mellitus with diabetic chronic kidney disease: Secondary | ICD-10-CM

## 2017-02-27 DIAGNOSIS — E785 Hyperlipidemia, unspecified: Secondary | ICD-10-CM | POA: Diagnosis not present

## 2017-02-27 DIAGNOSIS — E2839 Other primary ovarian failure: Secondary | ICD-10-CM

## 2017-02-27 DIAGNOSIS — R7989 Other specified abnormal findings of blood chemistry: Secondary | ICD-10-CM

## 2017-02-27 DIAGNOSIS — E1143 Type 2 diabetes mellitus with diabetic autonomic (poly)neuropathy: Secondary | ICD-10-CM

## 2017-02-27 DIAGNOSIS — I209 Angina pectoris, unspecified: Secondary | ICD-10-CM

## 2017-02-27 DIAGNOSIS — R790 Abnormal level of blood mineral: Secondary | ICD-10-CM | POA: Diagnosis not present

## 2017-02-27 DIAGNOSIS — F330 Major depressive disorder, recurrent, mild: Secondary | ICD-10-CM

## 2017-02-27 DIAGNOSIS — R931 Abnormal findings on diagnostic imaging of heart and coronary circulation: Secondary | ICD-10-CM | POA: Insufficient documentation

## 2017-02-27 LAB — POCT GLYCOSYLATED HEMOGLOBIN (HGB A1C): Hemoglobin A1C: 6.8

## 2017-02-27 LAB — TSH: TSH: 0.48 mIU/L

## 2017-02-27 NOTE — Progress Notes (Signed)
Name: Dana Sparks   MRN: [id number]    DOB: May 06, 1951   Date:02/27/2017       Progress Note  Subjective  Chief Complaint  Chief Complaint  Patient presents with  . Diabetes    3 month follow up checks daily   . Hypertension  . Obesity    has started walking on a regular basis  . Depression    HPI  Anemia: she was diagnosed earlier 2016 with h. Pylori, she was having a lot of dyspepsia. We tried treating her with antibiotics but she was unable to tolerate it. She was referred to Dr. Durwin Reges and Oncologist because of severe anemia. EGD was neg, she is taking ferrous sulfate twice daily and she states she has been compliant. No longer has epigastric pain. Dr. Mike Gip has recommended labs every 3 months, 3 hemoccult cards negative in 2017. Last CBC done at [facility] 01/2017 was normal, but ferritin was low back in March, we will recheck iron studies  DMII with gastroparesis/CKI: she is taking medication as prescribed, seeing Dr. Gabriel Carina now, she denies hypoglycemic episodes.  She has  been checking glucose at home, fasting is around 150-160's. Her hgbA1C is elevated - last hgbA1C 7.3 % ,  7.7% but now down to 6.8%.   S he has not been following a diabetic diet, and is  Exercising again.  She denies hypoglycemia, polydipsia, or polyuria. She has episodes of polyphagia She is on Ace for diabetic CKI, urine micro was 50  we will recheck it today. States gastroparesis symptoms are controlled - not on medication. Frozen shoulder on right side, improved with home PT.   HTN: at goal, no side effects of medication. She has chest pain weekly, but no palpitation.   Angina: CAD, her cardiologist is Dr. Clayborn Bigness, taking Imdur daily and Toprol XL and has chest pain once or twice a week, it lasts  She is also on statin therapy, aspirin and ace.She is on disability for it for many years. Still sees Dr. Clayborn Bigness, reviewed records and Echo from 2016 showed decreased in EF 25-30% and global hypokinesis. I will  contact Dr. Clayborn Bigness to find out about diagnosis of CHF.  OSA: she is using a CPAP machine every night, all night long.   Major Depression Mild: she denies crying spells. She has some fatigue, also anhedonia, she is taking antidepressants - Citalopram, and states symptoms are mild and does not want to change medication, she states she is sensitive to comments but able to talk to a relative or a friend and gets better quickly. She is not intimate with her husband - they don't sleep in the same room, but she is okay with that. She states they have a good relationship - not having sex is a mutual agreement. She states she has days that she worries a lot, but stable.  Morbid obesity: weight is stable.  She  has been more compliant with diet and exercise.   Hyperlipidemia: taking statin therapy  Myalgia and elevated ANA: seeing Rheumatologist, she was given cyclobenzaprine but not taking it. She denies current muscle aches, only right.  knee pain that is painful  Patient Active Problem List   Diagnosis Date Noted  . LVH (left ventricular hypertrophy) 02/27/2017  . Decreased cardiac ejection fraction 02/27/2017  . Left hand weakness 10/12/2016  . Elevated antinuclear antibody (ANA) level 04/27/2016  . Angina pectoris ([place]) 11/01/2015  . Well controlled type 2 diabetes mellitus with gastroparesis ([place]) 06/22/2015  . Low TSH  level 06/22/2015  . Iron deficiency anemia 04/07/2015  . Intestinal metaplasia of gastric mucosa 03/11/2015  . CAD (coronary artery disease), native coronary artery 02/24/2015  . History of coronary artery stent placement 02/24/2015  . Chronic kidney disease, stage 3, mod decreased GFR 02/24/2015  . Menopause 02/24/2015  . History of Helicobacter pylori infection 02/24/2015  . Mild mitral insufficiency 02/24/2015  . Mild tricuspid insufficiency 02/24/2015  . 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 ([place]) 02/24/2015  . Controlled gout  02/24/2015  . Depression, major, recurrent, mild ([place]) 02/24/2015  . Morbid obesity due to excess calories ([place]) 02/24/2015  . Mild pulmonary hypertension ([place]) 02/24/2015  . Gastroesophageal reflux disease without esophagitis 02/24/2015  . Perennial allergic rhinitis 02/24/2015  . HLD (hyperlipidemia) 02/20/2015  . Hypertension, benign 02/20/2015  . Apnea, sleep 02/20/2015  . Controlled diabetes mellitus with stage 3 chronic kidney disease, without long-term current use of insulin ([place]) 02/20/2015    Past Surgical History:  Procedure Laterality Date  . ABDOMINAL HYSTERECTOMY     total  . CATARACT EXTRACTION    . COLONOSCOPY  01/2014   sigmoid diverticulosis. desc colon TA, hyperplastic polyp  . CORONARY ANGIOPLASTY WITH STENT PLACEMENT    . ESOPHAGOGASTRODUODENOSCOPY N/A 02/16/2015   negative h pylori, focal gastic intestinal metaplasia  . TOTAL KNEE ARTHROPLASTY Right     Family History  Problem Relation Age of Onset  . Cancer Sister        unsure  . Alcohol abuse Brother   . Hypertension Mother   . Diabetes Mother   . Heart disease Mother   . Diabetes Father   . Heart disease Father   . Hypertension Father   . Colon cancer Neg Hx   . Liver disease Neg Hx   . Breast cancer Neg Hx     Social History   Social History  . Marital status: Married    Spouse name: N/A  . Number of children: N/A  . Years of education: N/A   Occupational History  . Not on file.   Social History Main Topics  . Smoking status: Former Smoker    Packs/day: 1.00    Years: 40.00    Types: Cigarettes    Quit date: 12/30/2012  . Smokeless tobacco: Never Used  . Alcohol use No  . Drug use: No  . Sexual activity: Not Currently   Other Topics Concern  . Not on file   Social History Narrative  . No narrative on file     Current Outpatient Prescriptions:  .  meloxicam (MOBIC) 7.5 MG tablet, Take by mouth., Disp: , Rfl:  .  ACCU-CHEK FASTCLIX LANCETS MISC, , Disp: , Rfl:  .   ACCU-CHEK SMARTVIEW test strip, , Disp: , Rfl:  .  Alcohol Swabs (B-D SINGLE USE SWABS REGULAR) PADS, 1 each by Does not apply route 4 (four) times daily., Disp: 200 each, Rfl: 2 .  allopurinol (ZYLOPRIM) 100 MG tablet, Take 1 tablet (100 mg total) by mouth daily., Disp: 90 tablet, Rfl: 3 .  alum & mag hydroxide-simeth (MAALOX/MYLANTA) 200-200-20 MG/5ML suspension, Take 30 mLs by mouth every 6 (six) hours as needed for indigestion or heartburn., Disp: 355 mL, Rfl: 0 .  amLODipine (NORVASC) 5 MG tablet, Take 1 tablet (5 mg total) by mouth daily., Disp: 90 tablet, Rfl: 3 .  aspirin EC 81 MG tablet, Take 81 mg by mouth every morning. , Disp: , Rfl:  .  blood glucose meter kit and supplies  KIT, Dispense based on patient and insurance preference. Use up to four times daily as directed. (FOR ICD-9 250.00, 250.01)., Disp: 1 each, Rfl: 0 .  citalopram (CELEXA) 20 MG tablet, Take 1 tablet (20 mg total) by mouth daily., Disp: 90 tablet, Rfl: 3 .  Coenzyme Q10 (COQ10) 100 MG CAPS, Take 1 capsule by mouth daily., Disp: 90 each, Rfl: 1 .  Ferrous Sulfate (IRON) 325 (65 Fe) MG TABS, Take 325 mg by mouth 2 (two) times daily. Nature made brand-  Pt takes one tablet twice a day , Disp: , Rfl:  .  fexofenadine (ALLEGRA) 180 MG tablet, Take 180 mg by mouth daily as needed for allergies. , Disp: , Rfl:  .  fluticasone (FLONASE) 50 MCG/ACT nasal spray, Place 2 sprays into both nostrils daily., Disp: 48 g, Rfl: 1 .  glipiZIDE (GLUCOTROL XL) 10 MG 24 hr tablet, TAKE 1 TABLET EVERY DAY WITH BREAKFAST, Disp: 90 tablet, Rfl: 1 .  insulin NPH Human (HUMULIN N,NOVOLIN N) 100 UNIT/ML injection, Inject 15-20 Units into the skin 2 (two) times daily before a meal. 15 units in the morning and 20 units at bedtime, Disp: , Rfl:  .  Insulin Pen Needle (PEN NEEDLES) 31G X 6 MM MISC, Use for insulin pen, Disp: 100 each, Rfl: 2 .  Insulin Syringe-Needle U-100 (INSULIN SYRINGE .5CC/31GX5/16") 31G X 5/16" 0.5 ML MISC, Use as directed. Use  two times daily., Disp: , Rfl:  .  isosorbide mononitrate (IMDUR) 60 MG 24 hr tablet, Take 1 tablet (60 mg total) by mouth daily., Disp: 90 tablet, Rfl: 1 .  lisinopril (PRINIVIL,ZESTRIL) 40 MG tablet, Take 1 tablet (40 mg total) by mouth daily., Disp: 90 tablet, Rfl: 3 .  metFORMIN (GLUCOPHAGE-XR) 500 MG 24 hr tablet, Take 2,000 tablets by mouth daily with supper. , Disp: , Rfl:  .  metoprolol succinate (TOPROL-XL) 50 MG 24 hr tablet, Take 1 tablet (50 mg total) by mouth daily., Disp: 90 tablet, Rfl: 3 .  pantoprazole (PROTONIX) 40 MG tablet, TAKE 1 TABLET EVERY MORNING, Disp: 90 tablet, Rfl: 1 .  rosuvastatin (CRESTOR) 20 MG tablet, Take 1 tablet (20 mg total) by mouth daily., Disp: 90 tablet, Rfl: 3  Allergies  Allergen Reactions  . Codeine Other (See Comments)    Unknown reaction  . Contrast Media [Iodinated Diagnostic Agents] Itching  . Sulfa Antibiotics Itching     ROS  Constitutional: Negative for fever or weight change.  Respiratory: Negative for cough , she has  shortness of breath with activity .   Cardiovascular: Negative for chest pain ( not recently, but went to [facility] May 2018 )or palpitations.  Gastrointestinal: Negative for abdominal pain, no bowel changes.  Musculoskeletal: Positive  for gait problem and right knee  joint swelling.  Skin: Negative for rash.  Neurological: Negative for dizziness or headache.  No other specific complaints in a complete review of systems (except as listed in HPI above).  Objective  Vitals:   02/27/17 0819  BP: 112/74  Pulse: 73  Resp: 16  Temp: 97.5 F (36.4 C)  SpO2: 94%  Weight: 230 lb (104.3 kg)  Height: _0  (1.676 m)    Body mass index is 37.12 kg/m.  Physical Exam  Constitutional: Patient appears well-developed and well-nourished. Obese  No distress.  HEENT: head atraumatic, normocephalic, pupils equal and reactive to light,  neck supple, throat within normal limits Cardiovascular: Normal rate, regular rhythm and  normal heart sounds.  No murmur heard. No BLE edema.  Pulmonary/Chest: Effort normal and breath sounds normal. No respiratory distress. Abdominal: Soft.  There is no tenderness. Psychiatric: Patient has a normal mood and affect. behavior is normal. Judgment and thought content normal. Muscular Skeletal: scar from previous right knee total replacement, larger joint than left side  Recent Results (from the past 2160 hour(s))  Basic metabolic panel     Status: Abnormal   Collection Time: 01/19/17 12:46 PM  Result Value Ref Range   Sodium 138 135 - 145 mmol/L   Potassium 4.3 3.5 - 5.1 mmol/L   Chloride 109 101 - 111 mmol/L   CO2 24 22 - 32 mmol/L   Glucose, Bld 132 (H) 65 - 99 mg/dL   BUN 16 6 - 20 mg/dL   Creatinine, Ser 0.92 0.44 - 1.00 mg/dL   Calcium 9.4 8.9 - 10.3 mg/dL   GFR calc non Af Amer >60 >60 mL/min   GFR calc Af Amer >60 >60 mL/min    Comment: (NOTE) The eGFR has been calculated using the CKD EPI equation. This calculation has not been validated in all clinical situations. eGFR's persistently <60 mL/min signify possible Chronic Kidney Disease.    Anion gap 5 5 - 15  CBC     Status: Abnormal   Collection Time: 01/19/17 12:46 PM  Result Value Ref Range   WBC 7.0 3.6 - 11.0 K/uL   RBC 4.26 3.80 - 5.20 MIL/uL   Hemoglobin 13.0 12.0 - 16.0 g/dL   HCT 39.1 35.0 - 47.0 %   MCV 91.9 80.0 - 100.0 fL   MCH 30.6 26.0 - 34.0 pg   MCHC 33.3 32.0 - 36.0 g/dL   RDW 15.6 (H) 11.5 - 14.5 %   Platelets 303 150 - 440 K/uL  Troponin I     Status: None   Collection Time: 01/19/17 12:46 PM  Result Value Ref Range   Troponin I <0.03 <0.03 ng/mL  Troponin I     Status: None   Collection Time: 01/19/17  4:28 PM  Result Value Ref Range   Troponin I <0.03 <0.03 ng/mL  POCT HgB A1C     Status: Abnormal   Collection Time: 02/27/17  8:31 AM  Result Value Ref Range   Hemoglobin A1C 6.8     Diabetic Foot Exam: Diabetic Foot Exam - Simple   Simple Foot Form Diabetic Foot exam was  performed with the following findings:  Yes 02/27/2017  8:54 AM  Visual Inspection No deformities, no ulcerations, no other skin breakdown bilaterally:  Yes Sensation Testing Intact to touch and monofilament testing bilaterally:  Yes Pulse Check Posterior Tibialis and Dorsalis pulse intact bilaterally:  Yes Comments      PHQ2/9: Depression screen [facility] 2/9 01/19/2017 07/17/2016 06/13/2016 05/11/2016 04/26/2016  Decreased Interest 0 0 0 1 0  Down, Depressed, Hopeless 0 0 0 0 0  PHQ - 2 Score 0 0 0 1 0     Fall Risk: Fall Risk  01/19/2017 07/17/2016 06/13/2016 05/11/2016 04/26/2016  Falls in the past year? Yes No No No No  Number falls in past yr: 1 - - - -  Injury with Fall? Yes - - - -     Assessment & Plan  1. Controlled type 2 diabetes mellitus with stage 3 chronic kidney disease, without long-term current use of insulin (HCC)  Seeing Dr. Gabriel Carina, reminded her not to have labs done in our office - POCT HgB A1C - COMPLETE METABOLIC PANEL WITH GFR - Urine Microalbumin w/creat. ratio  2. Angina  pectoris ([place])  Doing well at this time, on Imdur  3. Well controlled type 2 diabetes mellitus with gastroparesis ([place])  Doing well at this time  4. Depression, major, recurrent, mild (HCC)  Stable on medication   5. Obstructive sleep apnea syndrome  Wearing CPAP every night  6. Myalgia  Seeing Rheumatologist, elevated ANA and myalgia  7. Low TSH level  - TSH  8. Morbid obesity due to excess calories [facility])  Discussed with the patient the risk posed by an increased BMI. Discussed importance of portion control, calorie counting and at least 150 minutes of physical activity weekly. Avoid sweet beverages and drink more water. Eat at least 6 servings of fruit and vegetables daily   9. Hypertension, benign  At goal   10. Dyslipidemia  - Lipid panel  11. Controlled gout  - Uric acid  12. Low ferritin  - Iron, TIBC and Ferritin Panel  13. LVH (left ventricular  hypertrophy)  On echo done back in 2016, she has some symptoms of CHF, we will contact Dr. Clayborn Bigness  14. Decreased cardiac ejection fraction  EF 30% on echo done 2 years ago   15. Encounter for screening mammogram for breast cancer  - MM Digital Screening; Future  16. Ovarian failure  - DG Bone Density; Future

## 2017-02-27 NOTE — Telephone Encounter (Signed)
Dr. Gretta Began reviewed records and Echo from 2016 form Dr. Clayborn Bigness @ [facility] cardio and it  showed decreased in EF 25-30% and global hypokinesis. She would like to know if Dr. Clayborn Bigness  find out is her diagnosis is CHF. Called their office and left a message with his nurse to  Call our office back .

## 2017-02-28 LAB — COMPLETE METABOLIC PANEL WITH GFR
ALT: 14 U/L (ref 6–29)
AST: 18 U/L (ref 10–35)
Albumin: 4 g/dL (ref 3.6–5.1)
Alkaline Phosphatase: 57 U/L (ref 33–130)
BUN: 18 mg/dL (ref 7–25)
CALCIUM: 9.2 mg/dL (ref 8.6–10.4)
CHLORIDE: 106 mmol/L (ref 98–110)
CO2: 23 mmol/L (ref 20–31)
CREATININE: 0.92 mg/dL (ref 0.50–0.99)
GFR, Est African American: 76 mL/min (ref >=60)
GFR, Est Non African American: 66 mL/min (ref >=60)
GLUCOSE: 98 mg/dL (ref 65–99)
Potassium: 4.7 mmol/L (ref 3.5–5.3)
SODIUM: 138 mmol/L (ref 135–146)
Total Bilirubin: 0.3 mg/dL (ref 0.2–1.2)
Total Protein: 6.5 g/dL (ref 6.1–8.1)

## 2017-02-28 LAB — LIPID PANEL
Cholesterol: 121 mg/dL (ref <200)
HDL: 49 mg/dL — ABNORMAL LOW (ref >50)
LDL Cholesterol: 56 mg/dL (ref <100)
Total CHOL/HDL Ratio: 2.5 Ratio (ref <5.0)
Triglycerides: 82 mg/dL (ref <150)
VLDL: 16 mg/dL (ref <30)

## 2017-02-28 LAB — IRON,TIBC AND FERRITIN PANEL
%SAT: 28 % (ref 11–50)
Ferritin: 21 ng/mL (ref 20–288)
IRON: 99 ug/dL (ref 45–160)
TIBC: 357 ug/dL (ref 250–450)

## 2017-02-28 LAB — MICROALBUMIN / CREATININE URINE RATIO
CREATININE, URINE: 163 mg/dL (ref 20–320)
MICROALB/CREAT RATIO: 6 ug/mg{creat} (ref <30)
Microalb, Ur: 1 mg/dL

## 2017-02-28 LAB — URIC ACID: Uric Acid, Serum: 5 mg/dL (ref 2.5–7.0)

## 2017-03-01 ENCOUNTER — Telehealth: Payer: Self-pay

## 2017-03-02 NOTE — Telephone Encounter (Signed)
Spoke to taurus, CMA and she stated that she will have to speak to Dr. Clayborn Bigness. She state the pt last visit was in may 2018 and no DX of CHF was mention. She state it will poss that she might need another Echo just to see if her EF has increased or decreased. I will discuss this with the pt.

## 2017-03-16 ENCOUNTER — Other Ambulatory Visit: Payer: Self-pay | Admitting: Family Medicine

## 2017-03-16 ENCOUNTER — Other Ambulatory Visit: Payer: Self-pay

## 2017-03-16 ENCOUNTER — Telehealth: Payer: Self-pay | Admitting: Family Medicine

## 2017-03-16 DIAGNOSIS — E785 Hyperlipidemia, unspecified: Secondary | ICD-10-CM

## 2017-03-16 MED ORDER — ROSUVASTATIN CALCIUM 20 MG PO TABS: 20.0000 mg | ORAL_TABLET | Freq: Every day | ORAL | 0 refills | Status: DC

## 2017-03-16 NOTE — Telephone Encounter (Signed)
Patient requesting refill of [facility]restor to [facility]. 

## 2017-03-16 NOTE — Telephone Encounter (Signed)
PT IS OUT OF HER CRESTOR AND THE PHARM HAS SENT YOU A REQUEST. NEEDS GENERIC AND PHARM IS [facility] [address] THIS TIME ONLY 30 DAY SUPPLY AND SEND THE REST TO THE MAIL ORDER.

## 2017-03-16 NOTE — Telephone Encounter (Signed)
Call 30 days local pharmacy 90day sent in 01 with multiple refillss

## 2017-03-27 DIAGNOSIS — E1142 Type 2 diabetes mellitus with diabetic polyneuropathy: Secondary | ICD-10-CM | POA: Diagnosis not present

## 2017-03-27 DIAGNOSIS — B351 Tinea unguium: Secondary | ICD-10-CM | POA: Diagnosis not present

## 2017-03-27 DIAGNOSIS — L851 Acquired keratosis [keratoderma] palmaris et plantaris: Secondary | ICD-10-CM | POA: Diagnosis not present

## 2017-03-29 ENCOUNTER — Other Ambulatory Visit: Payer: Self-pay | Admitting: Family Medicine

## 2017-03-29 DIAGNOSIS — M791 Myalgia, unspecified site: Secondary | ICD-10-CM

## 2017-04-16 DIAGNOSIS — M25651 Stiffness of right hip, not elsewhere classified: Secondary | ICD-10-CM | POA: Diagnosis not present

## 2017-04-16 DIAGNOSIS — M7050 Other bursitis of knee, unspecified knee: Secondary | ICD-10-CM | POA: Diagnosis not present

## 2017-04-16 DIAGNOSIS — Z96651 Presence of right artificial knee joint: Secondary | ICD-10-CM | POA: Diagnosis not present

## 2017-04-17 ENCOUNTER — Ambulatory Visit
Admission: RE | Admit: 2017-04-17 | Discharge: 2017-04-17 | Disposition: A | Discharge status: Home / Self Care | Payer: Medicare HMO | Source: Ambulatory Visit | Attending: Family Medicine | Admitting: Family Medicine

## 2017-04-17 DIAGNOSIS — Z1231 Encounter for screening mammogram for malignant neoplasm of breast: Secondary | ICD-10-CM | POA: Insufficient documentation

## 2017-04-17 DIAGNOSIS — J449 Chronic obstructive pulmonary disease, unspecified: Secondary | ICD-10-CM | POA: Diagnosis not present

## 2017-04-17 DIAGNOSIS — Z780 Asymptomatic menopausal state: Secondary | ICD-10-CM | POA: Diagnosis not present

## 2017-04-17 DIAGNOSIS — M8588 Other specified disorders of bone density and structure, other site: Secondary | ICD-10-CM | POA: Diagnosis not present

## 2017-04-17 DIAGNOSIS — E2839 Other primary ovarian failure: Secondary | ICD-10-CM | POA: Diagnosis not present

## 2017-04-25 DIAGNOSIS — M25561 Pain in right knee: Secondary | ICD-10-CM | POA: Diagnosis not present

## 2017-04-25 DIAGNOSIS — M25661 Stiffness of right knee, not elsewhere classified: Secondary | ICD-10-CM | POA: Diagnosis not present

## 2017-04-25 DIAGNOSIS — R29898 Other symptoms and signs involving the musculoskeletal system: Secondary | ICD-10-CM | POA: Diagnosis not present

## 2017-04-25 DIAGNOSIS — G8929 Other chronic pain: Secondary | ICD-10-CM | POA: Diagnosis not present

## 2017-05-02 DIAGNOSIS — M25561 Pain in right knee: Secondary | ICD-10-CM | POA: Diagnosis not present

## 2017-05-02 DIAGNOSIS — G8929 Other chronic pain: Secondary | ICD-10-CM | POA: Diagnosis not present

## 2017-05-04 DIAGNOSIS — E1165 Type 2 diabetes mellitus with hyperglycemia: Secondary | ICD-10-CM | POA: Diagnosis not present

## 2017-05-04 LAB — MICROALBUMIN, URINE: Microalb, Ur: NEGATIVE

## 2017-05-04 LAB — HEMOGLOBIN A1C: Hemoglobin A1C: 7.1

## 2017-05-11 DIAGNOSIS — E1165 Type 2 diabetes mellitus with hyperglycemia: Secondary | ICD-10-CM | POA: Diagnosis not present

## 2017-05-11 DIAGNOSIS — E1159 Type 2 diabetes mellitus with other circulatory complications: Secondary | ICD-10-CM | POA: Diagnosis not present

## 2017-05-11 DIAGNOSIS — E1142 Type 2 diabetes mellitus with diabetic polyneuropathy: Secondary | ICD-10-CM | POA: Diagnosis not present

## 2017-05-12 ENCOUNTER — Other Ambulatory Visit: Payer: Self-pay | Admitting: Family Medicine

## 2017-05-12 DIAGNOSIS — K219 Gastro-esophageal reflux disease without esophagitis: Secondary | ICD-10-CM

## 2017-05-14 DIAGNOSIS — G4733 Obstructive sleep apnea (adult) (pediatric): Secondary | ICD-10-CM | POA: Diagnosis not present

## 2017-05-14 DIAGNOSIS — Z87891 Personal history of nicotine dependence: Secondary | ICD-10-CM | POA: Diagnosis not present

## 2017-05-14 DIAGNOSIS — I209 Angina pectoris, unspecified: Secondary | ICD-10-CM | POA: Diagnosis not present

## 2017-05-14 DIAGNOSIS — E669 Obesity, unspecified: Secondary | ICD-10-CM | POA: Diagnosis not present

## 2017-05-14 DIAGNOSIS — I2510 Atherosclerotic heart disease of native coronary artery without angina pectoris: Secondary | ICD-10-CM | POA: Diagnosis not present

## 2017-05-14 DIAGNOSIS — E785 Hyperlipidemia, unspecified: Secondary | ICD-10-CM | POA: Diagnosis not present

## 2017-05-14 DIAGNOSIS — I10 Essential (primary) hypertension: Secondary | ICD-10-CM | POA: Diagnosis not present

## 2017-05-14 DIAGNOSIS — N183 Chronic kidney disease, stage 3 (moderate): Secondary | ICD-10-CM | POA: Diagnosis not present

## 2017-05-14 DIAGNOSIS — F329 Major depressive disorder, single episode, unspecified: Secondary | ICD-10-CM | POA: Diagnosis not present

## 2017-05-15 DIAGNOSIS — M25561 Pain in right knee: Secondary | ICD-10-CM | POA: Diagnosis not present

## 2017-05-15 DIAGNOSIS — G8929 Other chronic pain: Secondary | ICD-10-CM | POA: Diagnosis not present

## 2017-05-17 ENCOUNTER — Other Ambulatory Visit: Payer: Self-pay | Admitting: Family Medicine

## 2017-05-17 NOTE — Telephone Encounter (Signed)
PT SAID THAT SE IS AT THE DRUG STORE TO GET HER REFILL ON TEST STRIPES FOR ACCU CHECK AND THEY TOLD HER THE REFILL LEFT IS NO LONGER GOOD. PLEASE SEND REFILL FOR THIS PHARM TO [facility] ON [address]

## 2017-05-17 NOTE — Telephone Encounter (Signed)
DONE

## 2017-05-17 NOTE — Telephone Encounter (Signed)
Please disregard message below. Pt called and stated that she found some and no longer need this prescription

## 2017-05-18 MED ORDER — ACCU-CHEK SMARTVIEW VI STRP: ORAL_STRIP | 6 refills | Status: DC

## 2017-05-21 ENCOUNTER — Ambulatory Visit: Payer: Medicare HMO | Admitting: Family Medicine

## 2017-05-23 ENCOUNTER — Encounter: Payer: Self-pay | Admitting: Family Medicine

## 2017-05-23 ENCOUNTER — Ambulatory Visit (INDEPENDENT_AMBULATORY_CARE_PROVIDER_SITE_OTHER): Payer: Medicare HMO | Admitting: Family Medicine

## 2017-05-23 VITALS — BP 118/72 | HR 68 | Temp 98.1°F | Resp 16 | Wt 228.8 lb

## 2017-05-23 DIAGNOSIS — Z23 Encounter for immunization: Secondary | ICD-10-CM

## 2017-05-23 DIAGNOSIS — M7551 Bursitis of right shoulder: Secondary | ICD-10-CM

## 2017-05-23 DIAGNOSIS — N183 Chronic kidney disease, stage 3 unspecified: Secondary | ICD-10-CM

## 2017-05-23 DIAGNOSIS — I739 Peripheral vascular disease, unspecified: Secondary | ICD-10-CM

## 2017-05-23 DIAGNOSIS — I10 Essential (primary) hypertension: Secondary | ICD-10-CM

## 2017-05-23 DIAGNOSIS — E1122 Type 2 diabetes mellitus with diabetic chronic kidney disease: Secondary | ICD-10-CM | POA: Diagnosis not present

## 2017-05-23 DIAGNOSIS — M25511 Pain in right shoulder: Secondary | ICD-10-CM

## 2017-05-23 MED ORDER — LIDOCAINE HCL (PF) 1 % IJ SOLN
2.0000 mL | Freq: Once | INTRAMUSCULAR | Status: AC
  Administered 2017-05-23: 2 mL via INTRADERMAL

## 2017-05-23 MED ORDER — TRIAMCINOLONE ACETONIDE 40 MG/ML IJ SUSP
40.0000 mg | Freq: Once | INTRAMUSCULAR | Status: AC
  Administered 2017-05-23: 40 mg via INTRAMUSCULAR

## 2017-05-23 MED ORDER — TRAMADOL HCL 50 MG PO TABS: 50.0000 mg | ORAL_TABLET | Freq: Three times a day (TID) | ORAL | 0 refills | Status: DC | PRN

## 2017-05-23 MED ORDER — LISINOPRIL 40 MG PO TABS: 40.0000 mg | ORAL_TABLET | Freq: Every day | ORAL | 1 refills | Status: DC

## 2017-05-23 NOTE — Progress Notes (Signed)
Name: Dana Sparks   MRN: [id number]    DOB: [birth year]   Date:05/23/2017       Progress Note  Subjective  Chief Complaint  Chief Complaint  Patient presents with  . Shoulder Pain    right unknown trauma, ongoing for 3-4 weeks    HPI  Right shoulder pain: she does not recall any trauma, but states that over the past month she has noticed constant pain on right shoulder that is worse with rom and when applying pressure to lateral aspect of shoulder, no rash, redness or increase in warmth.   HTN: she has been taking lisinopril once daily and bp has been at goal, we will send correct prescription to pharmacy  DMII : seeing Endocrinologist on NPH and glipizide, metformin. She would like to have me to manage her DM from now on, but explained that I don't feel comfortable with her current regiment, due to increase risk of hypoglycemia. Also explained that she needs to follow a strict diabetes diet for the next week, since she will get steroid injection  Claudication: she states that she has been walking less than one mile before she develops bilateral lower extremity pain, she states pain subsides once she stops walking. No edema  Patient Active Problem List   Diagnosis Date Noted  . LVH (left ventricular hypertrophy) 02/27/2017  . Decreased cardiac ejection fraction 02/27/2017  . Left hand weakness 10/12/2016  . Elevated antinuclear antibody (ANA) level 04/27/2016  . Angina pectoris ([place]) 11/01/2015  . Well controlled type 2 diabetes mellitus with gastroparesis ([place]) 06/22/2015  . Low TSH level 06/22/2015  . Iron deficiency anemia 04/07/2015  . Intestinal metaplasia of gastric mucosa 03/11/2015  . CAD (coronary artery disease), native coronary artery 02/24/2015  . History of coronary artery stent placement 02/24/2015  . Chronic kidney disease, stage 3, mod decreased GFR 02/24/2015  . Menopause 02/24/2015  . History of Helicobacter pylori infection 02/24/2015  . Mild mitral  insufficiency 02/24/2015  . Mild tricuspid insufficiency 02/24/2015  . 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 ([place]) 02/24/2015  . Controlled gout 02/24/2015  . Depression, major, recurrent, mild ([place]) 02/24/2015  . Morbid obesity due to excess calories ([place]) 02/24/2015  . Mild pulmonary hypertension ([place]) 02/24/2015  . Gastroesophageal reflux disease without esophagitis 02/24/2015  . Perennial allergic rhinitis 02/24/2015  . HLD (hyperlipidemia) 02/20/2015  . Hypertension, benign 02/20/2015  . Apnea, sleep 02/20/2015  . Controlled diabetes mellitus with stage 3 chronic kidney disease, without long-term current use of insulin ([place]) 02/20/2015    Past Surgical History:  Procedure Laterality Date  . ABDOMINAL HYSTERECTOMY     total  . CATARACT EXTRACTION    . COLONOSCOPY  01/2014   sigmoid diverticulosis. desc colon TA, hyperplastic polyp  . CORONARY ANGIOPLASTY WITH STENT PLACEMENT    . ESOPHAGOGASTRODUODENOSCOPY N/A 02/16/2015   negative h pylori, focal gastic intestinal metaplasia  . TOTAL KNEE ARTHROPLASTY Right     Family History  Problem Relation Age of Onset  . Cancer Sister        unsure  . Alcohol abuse Brother   . Hypertension Mother   . Diabetes Mother   . Heart disease Mother   . Diabetes Father   . Heart disease Father   . Hypertension Father   . Colon cancer Neg Hx   . Liver disease Neg Hx   . Breast cancer Neg Hx     Social History   Social History  . Marital status:  Married    Spouse name: N/A  . Number of children: N/A  . Years of education: N/A   Occupational History  . Not on file.   Social History Main Topics  . Smoking status: Former Smoker    Packs/day: 1.00    Years: 40.00    Types: Cigarettes    Quit date: 12/30/2012  . Smokeless tobacco: Never Used  . Alcohol use No  . Drug use: No  . Sexual activity: Not Currently   Other Topics Concern  . Not on file   Social History Narrative  . No narrative on file      Current Outpatient Prescriptions:  .  allopurinol (ZYLOPRIM) 100 MG tablet, Take 1 tablet (100 mg total) by mouth daily., Disp: 90 tablet, Rfl: 3 .  amLODipine (NORVASC) 5 MG tablet, Take 1 tablet (5 mg total) by mouth daily., Disp: 90 tablet, Rfl: 3 .  aspirin EC 81 MG tablet, Take 81 mg by mouth every morning. , Disp: , Rfl:  .  citalopram (CELEXA) 20 MG tablet, Take 1 tablet (20 mg total) by mouth daily., Disp: 90 tablet, Rfl: 3 .  Coenzyme Q-10 100 MG capsule, TAKE 1 CAPSULE EVERY DAY, Disp: 90 capsule, Rfl: 1 .  Ferrous Sulfate (IRON) 325 (65 Fe) MG TABS, Take 325 mg by mouth 2 (two) times daily. Nature made brand-  Pt takes one tablet twice a day , Disp: , Rfl:  .  fexofenadine (ALLEGRA) 180 MG tablet, Take 180 mg by mouth daily as needed for allergies. , Disp: , Rfl:  .  fluticasone (FLONASE) 50 MCG/ACT nasal spray, Place 2 sprays into both nostrils daily., Disp: 48 g, Rfl: 1 .  glipiZIDE (GLUCOTROL XL) 10 MG 24 hr tablet, TAKE 1 TABLET EVERY DAY WITH BREAKFAST, Disp: 90 tablet, Rfl: 1 .  insulin NPH Human (HUMULIN N,NOVOLIN N) 100 UNIT/ML injection, Inject 15-20 Units into the skin 2 (two) times daily before a meal. 15 units in the morning and 20 units at bedtime, Disp: , Rfl:  .  isosorbide mononitrate (IMDUR) 60 MG 24 hr tablet, Take 1 tablet (60 mg total) by mouth daily., Disp: 90 tablet, Rfl: 1 .  lisinopril (PRINIVIL,ZESTRIL) 40 MG tablet, Take 1 tablet (40 mg total) by mouth daily., Disp: 90 tablet, Rfl: 1 .  meloxicam (MOBIC) 7.5 MG tablet, Take by mouth., Disp: , Rfl:  .  metFORMIN (GLUCOPHAGE-XR) 500 MG 24 hr tablet, Take 2,000 tablets by mouth daily with supper. , Disp: , Rfl:  .  metoprolol succinate (TOPROL-XL) 50 MG 24 hr tablet, Take 1 tablet (50 mg total) by mouth daily., Disp: 90 tablet, Rfl: 3 .  pantoprazole (PROTONIX) 40 MG tablet, TAKE 1 TABLET EVERY MORNING, Disp: 90 tablet, Rfl: 1 .  rosuvastatin (CRESTOR) 20 MG tablet, TAKE 1 TABLET BY MOUTH EVERY DAY, Disp: 90  tablet, Rfl: 0 .  ACCU-CHEK FASTCLIX LANCETS MISC, , Disp: , Rfl:  .  ACCU-CHEK SMARTVIEW test strip, Check fsbs two times daily  DMII, Disp: 100 each, Rfl: 6 .  Alcohol Swabs (B-D SINGLE USE SWABS REGULAR) PADS, 1 each by Does not apply route 4 (four) times daily., Disp: 200 each, Rfl: 2 .  blood glucose meter kit and supplies KIT, Dispense based on patient and insurance preference. Use up to four times daily as directed. (FOR ICD-9 250.00, 250.01)., Disp: 1 each, Rfl: 0 .  Insulin Pen Needle (PEN NEEDLES) 31G X 6 MM MISC, Use for insulin pen, Disp: 100 each, Rfl: 2 .  Insulin Syringe-Needle U-100 (INSULIN SYRINGE .5CC/31GX5/16") 31G X 5/16" 0.5 ML MISC, Use as directed. Use two times daily., Disp: , Rfl:  .  traMADol (ULTRAM) 50 MG tablet, Take 1 tablet (50 mg total) by mouth every 8 (eight) hours as needed., Disp: 30 tablet, Rfl: 0  Current Facility-Administered Medications:  .  lidocaine (PF) (XYLOCAINE) 1 % injection 2 mL, 2 mL, Intradermal, Once, Virginie Josten, Drue Stager, MD .  triamcinolone acetonide (KENALOG-40) injection 40 mg, 40 mg, Intramuscular, Once, Steele Sizer, MD  Allergies  Allergen Reactions  . Codeine Other (See Comments)    Unknown reaction  . Contrast Media [Iodinated Diagnostic Agents] Itching  . Sulfa Antibiotics Itching     ROS  Constitutional: Negative for fever or significant  weight change.  Respiratory: Negative for cough and shortness of breath.   Cardiovascular: Negative for chest pain or palpitations.  Gastrointestinal: Negative for abdominal pain, no bowel changes.  Musculoskeletal: Positive  for gait problem and chronic right knee joint swelling.  Skin: Negative for rash. ( has a right index finger burn that is healing)  Neurological: Negative for dizziness or headache.  No other specific complaints in a complete review of systems (except as listed in HPI above).  Objective  Vitals:   05/23/17 1023  BP: 118/72  Pulse: 68  Resp: 16  Temp: 98.1 F  (36.7 C)  TempSrc: Oral  SpO2: 95%  Weight: 228 lb 12.8 oz (103.8 kg)    Body mass index is 36.93 kg/m.  Physical Exam  Constitutional: Patient appears well-developed and well-nourished. Obese  No distress.  HEENT: head atraumatic, normocephalic, pupils equal and reactive to light, neck supple, throat within normal limits Cardiovascular: Normal rate, regular rhythm and normal heart sounds.  No murmur heard. No BLE edema. Pulmonary/Chest: Effort normal and breath sounds normal. No respiratory distress. Abdominal: Soft.  There is no tenderness. Muscular Skeletal: pain during abduction of right shoulder, also with internal rotation of right shoulder , positive empty can sign, during palpation of right deltoid bursa and also right posterior shoulder, scar from right knee replacement surgery  Psychiatric: Patient has a normal mood and affect. behavior is normal. Judgment and thought content normal.  Recent Results (from the past 2160 hour(s))  POCT HgB A1C     Status: Abnormal   Collection Time: 02/27/17  8:31 AM  Result Value Ref Range   Hemoglobin A1C 6.8   Urine Microalbumin w/creat. ratio     Status: None   Collection Time: 02/27/17  8:42 AM  Result Value Ref Range   Creatinine, Urine 163 20 - 320 mg/dL   Microalb, Ur 1.0 Not estab mg/dL   Microalb Creat Ratio 6 <30 mcg/mg creat    Comment: The ADA has defined abnormalities in albumin excretion as follows:           Category           Result                            (mcg/mg creatinine)                 Normal:    <30       Microalbuminuria:    30 - 299   Clinical albuminuria:    > or = 300   The ADA recommends that at least two of three specimens collected within a 3 - 6 month period be abnormal before considering a patient to be within  a diagnostic category.     Iron, TIBC and Ferritin Panel     Status: None   Collection Time: 02/27/17  9:02 AM  Result Value Ref Range   Ferritin 21 20 - 288 ng/mL   Iron 99 45 - 160  ug/dL   TIBC 357 250 - 450 ug/dL   %SAT 28 11 - 50 %  Lipid panel     Status: Abnormal   Collection Time: 02/27/17  9:02 AM  Result Value Ref Range   Cholesterol 121 <200 mg/dL   Triglycerides 82 <150 mg/dL   HDL 49 (L) >50 mg/dL   Total CHOL/HDL Ratio 2.5 <5.0 Ratio   VLDL 16 <30 mg/dL   LDL Cholesterol 56 <100 mg/dL  COMPLETE METABOLIC PANEL WITH GFR     Status: None   Collection Time: 02/27/17  9:02 AM  Result Value Ref Range   Sodium 138 135 - 146 mmol/L   Potassium 4.7 3.5 - 5.3 mmol/L   Chloride 106 98 - 110 mmol/L   CO2 23 20 - 31 mmol/L   Glucose, Bld 98 65 - 99 mg/dL   BUN 18 7 - 25 mg/dL   Creat 0.92 0.50 - 0.99 mg/dL    Comment:   For patients > or = 66 years of age: The upper reference limit for Creatinine is approximately 13% higher for people identified as African-American.      Total Bilirubin 0.3 0.2 - 1.2 mg/dL   Alkaline Phosphatase 57 33 - 130 U/L   AST 18 10 - 35 U/L   ALT 14 6 - 29 U/L   Total Protein 6.5 6.1 - 8.1 g/dL   Albumin 4.0 3.6 - 5.1 g/dL   Calcium 9.2 8.6 - 10.4 mg/dL   GFR, Est African American 76 >=60 mL/min   GFR, Est Non African American 66 >=60 mL/min  TSH     Status: None   Collection Time: 02/27/17  9:02 AM  Result Value Ref Range   TSH 0.48 mIU/L    Comment:   Reference Range   > or = 20 Years  0.40-4.50   Pregnancy Range First trimester  0.26-2.66 Second trimester 0.55-2.73 Third trimester  0.43-2.91     Uric acid     Status: None   Collection Time: 02/27/17  9:02 AM  Result Value Ref Range   Uric Acid, Serum 5.0 2.5 - 7.0 mg/dL      PHQ2/9: Depression screen [facility] 2/9 05/23/2017 01/19/2017 07/17/2016 06/13/2016 05/11/2016  Decreased Interest 0 0 0 0 1  Down, Depressed, Hopeless 0 0 0 0 0  PHQ - 2 Score 0 0 0 0 1    Fall Risk: Fall Risk  05/23/2017 01/19/2017 07/17/2016 06/13/2016 05/11/2016  Falls in the past year? No Yes No No No  Number falls in past yr: - 1 - - -  Injury with Fall? - Yes - - -  Comment - Right  Thumb from ice - - -    Functional Status Survey: Is the patient deaf or have difficulty hearing?: No Does the patient have difficulty seeing, even when wearing glasses/contacts?: Yes Does the patient have difficulty concentrating, remembering, or making decisions?: No Does the patient have difficulty walking or climbing stairs?: No Does the patient have difficulty dressing or bathing?: No Does the patient have difficulty doing errands alone such as visiting a doctor's office or shopping?: No   Assessment & Plan  1. Acute pain of right shoulder  Discussed possible rotator cuff tear/tendinitis  and also component of deltoid bursitis. Discussed risk and benefits of procedure.   2. Needs flu shot  - Flu vaccine HIGH DOSE PF (Fluzone High dose)  3. Hypertension, benign  She is only taking one pill daily  - lisinopril (PRINIVIL,ZESTRIL) 40 MG tablet; Take 1 tablet (40 mg total) by mouth daily.  Dispense: 90 tablet; Refill: 1  4. Controlled type 2 diabetes mellitus with stage 3 chronic kidney disease, without long-term current use of insulin ([place])   5. Deltoid bursitis, right  Consent form signed Localized bursa  Area prepped with alcohol  Injection with lidocaine 1% and Kenalog 48m/1 ml on bursa sack Patient tolerated procedure well No side effects  - triamcinolone acetonide (KENALOG-40) injection 40 mg; Inject 1 mL (40 mg total) into the muscle once. - lidocaine (PF) (XYLOCAINE) 1 % injection 2 mL; Inject 2 mLs into the skin once. - traMADol (ULTRAM) 50 MG tablet; Take 1 tablet (50 mg total) by mouth every 8 (eight) hours as needed.  Dispense: 30 tablet; Refill: 0  6. Claudication ([facility]  Has to stop after walking less than one mile, had to stop PT because of it - Ambulatory referral to Vascular Surgery

## 2017-05-31 ENCOUNTER — Encounter: Payer: Self-pay | Admitting: Family Medicine

## 2017-05-31 ENCOUNTER — Ambulatory Visit (INDEPENDENT_AMBULATORY_CARE_PROVIDER_SITE_OTHER): Payer: Medicare HMO | Admitting: Family Medicine

## 2017-05-31 VITALS — BP 82/46 | HR 62 | Wt 226.5 lb

## 2017-05-31 DIAGNOSIS — F330 Major depressive disorder, recurrent, mild: Secondary | ICD-10-CM | POA: Diagnosis not present

## 2017-05-31 DIAGNOSIS — E1122 Type 2 diabetes mellitus with diabetic chronic kidney disease: Secondary | ICD-10-CM

## 2017-05-31 DIAGNOSIS — I209 Angina pectoris, unspecified: Secondary | ICD-10-CM

## 2017-05-31 DIAGNOSIS — I10 Essential (primary) hypertension: Secondary | ICD-10-CM

## 2017-05-31 DIAGNOSIS — G4733 Obstructive sleep apnea (adult) (pediatric): Secondary | ICD-10-CM

## 2017-05-31 DIAGNOSIS — M7551 Bursitis of right shoulder: Secondary | ICD-10-CM | POA: Diagnosis not present

## 2017-05-31 DIAGNOSIS — E785 Hyperlipidemia, unspecified: Secondary | ICD-10-CM

## 2017-05-31 DIAGNOSIS — I739 Peripheral vascular disease, unspecified: Secondary | ICD-10-CM | POA: Diagnosis not present

## 2017-05-31 DIAGNOSIS — N183 Chronic kidney disease, stage 3 unspecified: Secondary | ICD-10-CM

## 2017-05-31 DIAGNOSIS — R232 Flushing: Secondary | ICD-10-CM

## 2017-05-31 MED ORDER — ROSUVASTATIN CALCIUM 20 MG PO TABS: 20.0000 mg | ORAL_TABLET | Freq: Every day | ORAL | 1 refills | Status: DC

## 2017-05-31 MED ORDER — VENLAFAXINE HCL ER 37.5 MG PO CP24: 37.5000 mg | ORAL_CAPSULE | Freq: Every day | ORAL | 0 refills | Status: DC

## 2017-05-31 NOTE — Progress Notes (Signed)
Name: Dana Sparks   MRN: [id number]    DOB: 27-Oct-1950   Date:05/31/2017       Progress Note  Subjective  Chief Complaint  Chief Complaint  Patient presents with  . Diabetes    HPI  Anemia: she was diagnosed earlier 2016 with h. Pylori, she was having a lot of dyspepsia. We tried treating her with antibiotics but she was unable to tolerate it. She was referred to Dr. Durwin Reges and Oncologist because of severe anemia. EGD was neg, she is taking ferrous sulfate twice daily and she states she has been compliant. No longer has epigastric pain. Dr. Mike Gip has recommended labs every 3 months, 3 hemoccult cards negative in 2017. Last CBC done at [facility] 01/2017 was normal, ferritin is also back to normal and she has been compliant with iron supplementation   DMII with gastroparesis/CKI: she is taking medication as prescribed, seeing Dr. Gabriel Carina now, she denies hypoglycemic episodes.  She has  been checking glucose at home, fasting is around 150-160's. Her hgbA1C is elevated - last hgbA1C 7.3 % ,  7.7% but now down to 6.8% last checked by endo was 7.1% in August She is on insulin and glucotorox and has noticed hypoglycemia, polydipsia, or polyuria. She has episodes of polyphagia She is on Ace for diabetic CKI, she has a history of microalbuminuria. States gastroparesis symptoms are controlled - not on medication. Frozen shoulder on right side, improved with home PT and had steroid injection last week after a bursitis episode.  HTN: she denies dizziness, but bp is very low today, checked multiple times in our office, no chest pain or SOB at this time, she will return tomorrow for recheck  Angina: CAD, her cardiologist is Dr. Clayborn Bigness, taking Imdur daily and Toprol XL and has chest pain once or twice a week, it lasts She is also on statin therapy, aspirin and ace.She is on disability for it for many years. Still sees Dr. Clayborn Bigness, reviewed records and Echo from 2016 showed decreased in EF 25-30% and global  hypokinesis  OSA: she is using a CPAP machine every night, all night long.   Major Depression Mild: she denies crying spells. She has some fatigue, also anhedonia, she is taking antidepressants - Citalopram, she states she is sensitive to comments but able to talk to a relative or a friend and gets better quickly. She is not intimate with her husband - they don't sleep in the same room, but she is okay with that. She states they have a good relationship - not having sex is a mutual agreement. She states she has days that she worries a lot, but stable. She has noticed worsening of hot flashes, she also states used to take estrogen but has been off. We will try switching to Effexor  Morbid obesity: weight is stable. She  has been more compliant with diet and exercise. Still walking daily, has claudication and referral to vascular surgeon is pending  Hyperlipidemia: taking statin therapy, needs a refill Patient Active Problem List   Diagnosis Date Noted  . LVH (left ventricular hypertrophy) 02/27/2017  . Decreased cardiac ejection fraction 02/27/2017  . Left hand weakness 10/12/2016  . Elevated antinuclear antibody (ANA) level 04/27/2016  . Angina pectoris ([place]) 11/01/2015  . Well controlled type 2 diabetes mellitus with gastroparesis ([place]) 06/22/2015  . Low TSH level 06/22/2015  . Iron deficiency anemia 04/07/2015  . Intestinal metaplasia of gastric mucosa 03/11/2015  . CAD (coronary artery disease), native coronary artery 02/24/2015  .  History of coronary artery stent placement 02/24/2015  . Chronic kidney disease, stage 3, mod decreased GFR 02/24/2015  . Menopause 02/24/2015  . History of Helicobacter pylori infection 02/24/2015  . Mild mitral insufficiency 02/24/2015  . Mild tricuspid insufficiency 02/24/2015  . 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 ([place]) 02/24/2015  . Controlled gout 02/24/2015  . Depression, major, recurrent, mild ([place]) 02/24/2015   . Morbid obesity due to excess calories ([place]) 02/24/2015  . Mild pulmonary hypertension ([place]) 02/24/2015  . Gastroesophageal reflux disease without esophagitis 02/24/2015  . Perennial allergic rhinitis 02/24/2015  . HLD (hyperlipidemia) 02/20/2015  . Hypertension, benign 02/20/2015  . Apnea, sleep 02/20/2015  . Controlled diabetes mellitus with stage 3 chronic kidney disease, without long-term current use of insulin ([place]) 02/20/2015    Past Surgical History:  Procedure Laterality Date  . ABDOMINAL HYSTERECTOMY     total  . CATARACT EXTRACTION    . COLONOSCOPY  01/2014   sigmoid diverticulosis. desc colon TA, hyperplastic polyp  . CORONARY ANGIOPLASTY WITH STENT PLACEMENT    . ESOPHAGOGASTRODUODENOSCOPY N/A 02/16/2015   negative h pylori, focal gastic intestinal metaplasia  . TOTAL KNEE ARTHROPLASTY Right     Family History  Problem Relation Age of Onset  . Cancer Sister        unsure  . Alcohol abuse Brother   . Hypertension Mother   . Diabetes Mother   . Heart disease Mother   . Diabetes Father   . Heart disease Father   . Hypertension Father   . Colon cancer Neg Hx   . Liver disease Neg Hx   . Breast cancer Neg Hx     Social History   Social History  . Marital status: Married    Spouse name: N/A  . Number of children: N/A  . Years of education: N/A   Occupational History  . Not on file.   Social History Main Topics  . Smoking status: Former Smoker    Packs/day: 1.00    Years: 40.00    Types: Cigarettes    Quit date: 12/30/2012  . Smokeless tobacco: Never Used  . Alcohol use No  . Drug use: No  . Sexual activity: Not Currently   Other Topics Concern  . Not on file   Social History Narrative  . No narrative on file     Current Outpatient Prescriptions:  .  ACCU-CHEK FASTCLIX LANCETS MISC, , Disp: , Rfl:  .  ACCU-CHEK SMARTVIEW test strip, Check fsbs two times daily  DMII, Disp: 100 each, Rfl: 6 .  Alcohol Swabs (B-D SINGLE USE SWABS REGULAR)  PADS, 1 each by Does not apply route 4 (four) times daily., Disp: 200 each, Rfl: 2 .  allopurinol (ZYLOPRIM) 100 MG tablet, Take 1 tablet (100 mg total) by mouth daily., Disp: 90 tablet, Rfl: 3 .  amLODipine (NORVASC) 5 MG tablet, Take 1 tablet (5 mg total) by mouth daily., Disp: 90 tablet, Rfl: 3 .  aspirin EC 81 MG tablet, Take 81 mg by mouth every morning. , Disp: , Rfl:  .  blood glucose meter kit and supplies KIT, Dispense based on patient and insurance preference. Use up to four times daily as directed. (FOR ICD-9 250.00, 250.01)., Disp: 1 each, Rfl: 0 .  Coenzyme Q-10 100 MG capsule, TAKE 1 CAPSULE EVERY DAY, Disp: 90 capsule, Rfl: 1 .  Ferrous Sulfate (IRON) 325 (65 Fe) MG TABS, Take 325 mg by mouth 2 (two) times daily. Petra Kuba made brand-  Pt takes one tablet twice a day , Disp: , Rfl:  .  fexofenadine (ALLEGRA) 180 MG tablet, Take 180 mg by mouth daily as needed for allergies. , Disp: , Rfl:  .  fluticasone (FLONASE) 50 MCG/ACT nasal spray, Place 2 sprays into both nostrils daily., Disp: 48 g, Rfl: 1 .  glipiZIDE (GLUCOTROL XL) 10 MG 24 hr tablet, TAKE 1 TABLET EVERY DAY WITH BREAKFAST, Disp: 90 tablet, Rfl: 1 .  insulin NPH Human (HUMULIN N,NOVOLIN N) 100 UNIT/ML injection, Inject 15-20 Units into the skin 2 (two) times daily before a meal. 15 units in the morning and 20 units at bedtime, Disp: , Rfl:  .  Insulin Pen Needle (PEN NEEDLES) 31G X 6 MM MISC, Use for insulin pen, Disp: 100 each, Rfl: 2 .  Insulin Syringe-Needle U-100 (INSULIN SYRINGE .5CC/31GX5/16") 31G X 5/16" 0.5 ML MISC, Use as directed. Use two times daily., Disp: , Rfl:  .  isosorbide mononitrate (IMDUR) 60 MG 24 hr tablet, Take 1 tablet (60 mg total) by mouth daily., Disp: 90 tablet, Rfl: 1 .  lisinopril (PRINIVIL,ZESTRIL) 40 MG tablet, Take 1 tablet (40 mg total) by mouth daily., Disp: 90 tablet, Rfl: 1 .  meloxicam (MOBIC) 7.5 MG tablet, Take by mouth., Disp: , Rfl:  .  metFORMIN (GLUCOPHAGE-XR) 500 MG 24 hr tablet, Take  2,000 tablets by mouth daily with supper. , Disp: , Rfl:  .  metoprolol succinate (TOPROL-XL) 50 MG 24 hr tablet, Take 1 tablet (50 mg total) by mouth daily., Disp: 90 tablet, Rfl: 3 .  pantoprazole (PROTONIX) 40 MG tablet, TAKE 1 TABLET EVERY MORNING, Disp: 90 tablet, Rfl: 1 .  rosuvastatin (CRESTOR) 20 MG tablet, Take 1 tablet (20 mg total) by mouth daily., Disp: 90 tablet, Rfl: 1 .  traMADol (ULTRAM) 50 MG tablet, Take 1 tablet (50 mg total) by mouth every 8 (eight) hours as needed., Disp: 30 tablet, Rfl: 0 .  venlafaxine XR (EFFEXOR-XR) 37.5 MG 24 hr capsule, Take 1-2 capsules (37.5-75 mg total) by mouth daily with breakfast. First week take one with Celexa after that take 2 daily and stop celexa, Disp: 60 capsule, Rfl: 0  Allergies  Allergen Reactions  . Codeine Other (See Comments)    Unknown reaction  . Contrast Media [Iodinated Diagnostic Agents] Itching  . Sulfa Antibiotics Itching     ROS  Constitutional: Negative for fever or weight change.  Respiratory: Negative for cough and shortness of breath.   Cardiovascular: Negative for chest pain or palpitations.  Gastrointestinal: Negative for abdominal pain, no bowel changes.  Musculoskeletal: Positive for chronic gait problem or joint swelling.  Skin: Negative for rash.  Neurological: Negative for dizziness or headache.  No other specific complaints in a complete review of systems (except as listed in HPI above).  Objective  Vitals:   05/31/17 0918  BP: (!) 80/50  Pulse: 62  SpO2: 98%  Weight: 226 lb 8 oz (102.7 kg)    Body mass index is 36.56 kg/m.  Physical Exam  Constitutional: Patient appears well-developed and well-nourished. Obese  No distress.  HEENT: head atraumatic, normocephalic, pupils equal and reactive to light,neck supple, throat within normal limits Cardiovascular: Normal rate, regular rhythm and normal heart sounds.  No murmur heard. No BLE edema. Pulmonary/Chest: Effort normal and breath sounds  normal. No respiratory distress. Abdominal: Soft.  There is no tenderness. Psychiatric: Patient has a normal mood and affect. behavior is normal. Judgment and thought content normal.   PHQ2/9: Depression screen [facility] 2/9  05/23/2017 01/19/2017 07/17/2016 06/13/2016 05/11/2016  Decreased Interest 0 0 0 0 1  Down, Depressed, Hopeless 0 0 0 0 0  PHQ - 2 Score 0 0 0 0 1     Fall Risk: Fall Risk  05/31/2017 05/23/2017 01/19/2017 07/17/2016 06/13/2016  Falls in the past year? No No Yes No No  Number falls in past yr: - - 1 - -  Injury with Fall? - - Yes - -  Comment - - Right Thumb from ice - -     Assessment & Plan  1. Controlled type 2 diabetes mellitus with stage 3 chronic kidney disease, without long-term current use of insulin (HCC)  Continue follow up with Dr. Gabriel Carina, explained that since she is taking sulfonylurea and insulin I don't feel comfortable managing her DM  2. Angina pectoris ([place])  On Imdur daily and denies chest pain  3. Depression, major, recurrent, mild (HCC)  We will switch from Celexa to Effexor because of hot flashes - venlafaxine XR (EFFEXOR-XR) 37.5 MG 24 hr capsule; Take 1-2 capsules (37.5-75 mg total) by mouth daily with breakfast. First week take one with Celexa after that take 2 daily and stop celexa  Dispense: 60 capsule; Refill: 0  4. Obstructive sleep apnea syndrome   5. Claudication ([place])  stable  6. Deltoid bursitis, right  Doing well since steroid injection last week, normal rom  7. Hypertension, benign  bp is very low today, denies any symptoms, advised to get a bp machine to monitor at home She will return tomorrow prior to taking Norvasc and we will decide if we need to decrease dose to half   8. Morbid obesity due to excess calories [facility])  Discussed with the patient the risk posed by an increased BMI. Discussed importance of portion control, calorie counting and at least 150 minutes of physical activity weekly. Avoid sweet beverages and  drink more water. Eat at least 6 servings of fruit and vegetables daily   9. Dyslipidemia  - rosuvastatin (CRESTOR) 20 MG tablet; Take 1 tablet (20 mg total) by mouth daily.  Dispense: 90 tablet; Refill: 1  10. Hot flashes  - venlafaxine XR (EFFEXOR-XR) 37.5 MG 24 hr capsule; Take 1-2 capsules (37.5-75 mg total) by mouth daily with breakfast. First week take one with Celexa after that take 2 daily and stop celexa  Dispense: 60 capsule; Refill: 0

## 2017-05-31 NOTE — Patient Instructions (Signed)
Take all medications in the morning, except for Norvasc, stop by in our office in the morning and we will recheck bp and decide if we need to go down on the dose  Take half dose of Celexa and one pill of Effexor for the first week on new medication, after that stop Celexa and take two Effexor capsules, next rx will be for 75 mg for hot flashes and depression

## 2017-06-01 ENCOUNTER — Ambulatory Visit: Payer: Medicare HMO

## 2017-06-01 ENCOUNTER — Other Ambulatory Visit: Payer: Self-pay | Admitting: Family Medicine

## 2017-06-01 VITALS — BP 90/50 | HR 64

## 2017-06-01 DIAGNOSIS — R232 Flushing: Secondary | ICD-10-CM

## 2017-06-01 DIAGNOSIS — I10 Essential (primary) hypertension: Secondary | ICD-10-CM

## 2017-06-01 DIAGNOSIS — F330 Major depressive disorder, recurrent, mild: Secondary | ICD-10-CM

## 2017-06-01 MED ORDER — VENLAFAXINE HCL ER 37.5 MG PO CP24: 37.5000 mg | ORAL_CAPSULE | Freq: Every day | ORAL | 0 refills | Status: DC

## 2017-06-01 NOTE — Progress Notes (Signed)
Patient came in today for blood pressure check. Her blood pressure was 90/50 and pulse was 64. She denies chest pain, palpitations or sob. Dana Sparks consulted with her and instructed her to hold Norvasc today and start taking Norvasc (2.5 mg)  tomorrow and to check her blood pressure if she could.

## 2017-06-01 NOTE — Progress Notes (Signed)
Pt in for BP check. BP is 90/50 today, up from 80/50 yesterday.  She has not taken her medication today.  She is unable to afford a home BP cuff, and does not live near a drug store where she can check it over the weekend.  Advised that she skip her Amlodipine today, and decrease her dose to 2.5mg  (1/2 tablet) for Saturday & Sunday.  She will return Monday for a BP check prior to taking her Monday dose of amlodipine.  Emergency Care precautions discussed including lightheadedness, chest pain, shortness of breath, syncope/near-syncope, dizziness, or confusion.  She was prescribed Effexor yesterday, and a 60-day supply was sent to her [facility].  She requests a week supply be sent to a local pharmacy so that she may start immediately.  She verbalizes plan to taper off of Celexa and onto Effexor per Dr. Ancil Boozer' orders at her visit yesterday.  7 day supply is provided to [place].

## 2017-06-04 ENCOUNTER — Ambulatory Visit (INDEPENDENT_AMBULATORY_CARE_PROVIDER_SITE_OTHER): Payer: Medicare HMO | Admitting: Vascular Surgery

## 2017-06-04 ENCOUNTER — Ambulatory Visit: Payer: Medicare HMO

## 2017-06-04 ENCOUNTER — Encounter (INDEPENDENT_AMBULATORY_CARE_PROVIDER_SITE_OTHER): Payer: Self-pay | Admitting: Vascular Surgery

## 2017-06-04 VITALS — BP 129/71 | HR 62 | Resp 17 | Ht 65.5 in | Wt 224.6 lb

## 2017-06-04 VITALS — BP 106/60 | HR 64

## 2017-06-04 DIAGNOSIS — I10 Essential (primary) hypertension: Secondary | ICD-10-CM

## 2017-06-04 DIAGNOSIS — N183 Chronic kidney disease, stage 3 (moderate): Secondary | ICD-10-CM

## 2017-06-04 DIAGNOSIS — E785 Hyperlipidemia, unspecified: Secondary | ICD-10-CM | POA: Diagnosis not present

## 2017-06-04 DIAGNOSIS — I739 Peripheral vascular disease, unspecified: Secondary | ICD-10-CM | POA: Insufficient documentation

## 2017-06-04 DIAGNOSIS — E1122 Type 2 diabetes mellitus with diabetic chronic kidney disease: Secondary | ICD-10-CM

## 2017-06-04 NOTE — Progress Notes (Signed)
Patient came in to have her bp checked today. She denies chest pain, sob, or dizziness. She did not start taking 0.5 tablet of Norvasc.  Blood pressure: 106/60 Pulse: 64  Patient called to clarify instructions about Norvasc. Verbal order given from Dr. Ancil Boozer to stop Norvasc and take 0.5 tablet of Lisinopril and follow up in 2 days.

## 2017-06-04 NOTE — Progress Notes (Signed)
Subjective:    Patient ID: Dana Sparks, female    DOB: [birth year], 66 y.o.   MRN: [id number] Chief Complaint  Patient presents with  . Claudication    ref -sowles   Presents as a new patient referred by Dr. Ancil Boozer for "location". Patient endorses a history of progressively worsening bilateral leg cramps over the last 5 months. History of a right total knee replacement 2. Patient has noticed a interval shortening her claudication distance. States the pain is primarily in her calf and towards the front of her shin bilaterally. Patient denies any rest pain or ulceration to the lower extremity. Patient states her discomfort has progressive the point she is unable to function on a daily basis. Patient denies any fever, nausea or vomiting.   Review of Systems  Constitutional: Negative.   HENT: Negative.   Eyes: Negative.   Respiratory: Negative.   Cardiovascular:       Lower extremity claudication  Gastrointestinal: Negative.   Endocrine: Negative.   Genitourinary: Negative.   Musculoskeletal: Negative.   Skin: Negative.   Allergic/Immunologic: Negative.   Neurological: Negative.   Hematological: Negative.   Psychiatric/Behavioral: Negative.       Objective:   Physical Exam  Constitutional: She is oriented to person, place, and time. She appears well-developed and well-nourished. No distress.  HENT:  Head: Normocephalic and atraumatic.  Eyes: Pupils are equal, round, and reactive to light. Conjunctivae are normal.  Neck: Normal range of motion.  Cardiovascular: Normal rate, regular rhythm, normal heart sounds and intact distal pulses.   Pulses:      Radial pulses are 2+ on the right side, and 2+ on the left side.       Dorsalis pedis pulses are 1+ on the right side, and 1+ on the left side.       Posterior tibial pulses are 1+ on the right side, and 1+ on the left side.  Faint pedal pulses bilateally  Pulmonary/Chest: Effort normal.  Musculoskeletal: Normal range of motion.  She exhibits no edema.  Neurological: She is alert and oriented to person, place, and time.  Skin: Skin is warm and dry. She is not diaphoretic.  Psychiatric: She has a normal mood and affect. Her behavior is normal. Judgment and thought content normal.  Vitals reviewed.  BP 129/71 (BP Location: Right Arm)   Pulse 62   Resp 17   Ht 5' 5.5" (1.664 m)   Wt 224 lb 9.6 oz (101.9 kg)   BMI 36.81 kg/m   Past Medical History:  Diagnosis Date  . Allergy   . Anxiety   . Arthritis   . CAD (coronary artery disease)   . COPD (chronic obstructive pulmonary disease) ([place])   . Depression   . Diabetes mellitus without complication ([place])   . Diverticulitis   . Gastroparesis   . Gout   . Hyperlipemia   . Hypertension   . Positive H. pylori test   . Renal insufficiency   . Sleep apnea   . Thrombocytosis ([place]) 01/28/2015  . Tubular adenoma of colon 01/20/14   Social History   Social History  . Marital status: Married    Spouse name: N/A  . Number of children: N/A  . Years of education: N/A   Occupational History  . Not on file.   Social History Main Topics  . Smoking status: Former Smoker    Packs/day: 1.00    Years: 40.00    Types: Cigarettes    Quit date: 12/30/2012  .  Smokeless tobacco: Never Used  . Alcohol use No  . Drug use: No  . Sexual activity: Not Currently   Other Topics Concern  . Not on file   Social History Narrative  . No narrative on file   Past Surgical History:  Procedure Laterality Date  . ABDOMINAL HYSTERECTOMY     total  . CATARACT EXTRACTION    . COLONOSCOPY  01/2014   sigmoid diverticulosis. desc colon TA, hyperplastic polyp  . CORONARY ANGIOPLASTY WITH STENT PLACEMENT    . ESOPHAGOGASTRODUODENOSCOPY N/A 02/16/2015   negative h pylori, focal gastic intestinal metaplasia  . TOTAL KNEE ARTHROPLASTY Right    Family History  Problem Relation Age of Onset  . Cancer Sister        unsure  . Alcohol abuse Brother   . Hypertension Mother   .  Diabetes Mother   . Heart disease Mother   . Diabetes Father   . Heart disease Father   . Hypertension Father   . Colon cancer Neg Hx   . Liver disease Neg Hx   . Breast cancer Neg Hx     Allergies  Allergen Reactions  . Codeine Other (See Comments)    Unknown reaction  . Contrast Media [Iodinated Diagnostic Agents] Itching  . Sulfa Antibiotics Itching      Assessment & Plan:  Presents as a new patient referred by Dr. Ancil Boozer for "location". Patient endorses a history of progressively worsening bilateral leg cramps over the last 5 months. History of a right total knee replacement 2. Patient has noticed a interval shortening her claudication distance. States the pain is primarily in her calf and towards the front of her shin bilaterally. Patient denies any rest pain or ulceration to the lower extremity. Patient states her discomfort has progressive the point she is unable to function on a daily basis. Patient denies any fever, nausea or vomiting.  1. Claudication - New Patient with multiple risk factors for peripheral artery disease Faint pedal pulses on exam Recommed bilateral ABI to assess for any contributing peripheral artery disease I have discussed with the patient at length the risk factors for and pathogenesis of atherosclerotic disease and encouraged a healthy diet, regular exercise regimen and blood pressure / glucose control.  The patient was encouraged to call the office in the interim if he experiences any claudication like symptoms, rest pain or ulcers to her feet / toes.  - VAS [place] ABI WITH/WO TBI; Future  2. Hypertension, benign - Stable Encouraged good control as its slows the progression of atherosclerotic disease  3. Controlled type 2 diabetes mellitus with stage 3 chronic kidney disease, without long-term current use of insulin (HCC) - Stable Encouraged good control as its slows the progression of atherosclerotic and renal disease  4. Hyperlipidemia, unspecified  hyperlipidemia type - Stable Encouraged good control as its slows the progression of atherosclerotic disease  Current Outpatient Prescriptions on File Prior to Visit  Medication Sig Dispense Refill  . ACCU-CHEK FASTCLIX LANCETS MISC     . ACCU-CHEK SMARTVIEW test strip Check fsbs two times daily  DMII 100 each 6  . Alcohol Swabs (B-D SINGLE USE SWABS REGULAR) PADS 1 each by Does not apply route 4 (four) times daily. 200 each 2  . allopurinol (ZYLOPRIM) 100 MG tablet Take 1 tablet (100 mg total) by mouth daily. 90 tablet 3  . amLODipine (NORVASC) 5 MG tablet Take 1 tablet (5 mg total) by mouth daily. 90 tablet 3  . aspirin EC 81  MG tablet Take 81 mg by mouth every morning.     . blood glucose meter kit and supplies KIT Dispense based on patient and insurance preference. Use up to four times daily as directed. (FOR ICD-9 250.00, 250.01). 1 each 0  . Coenzyme Q-10 100 MG capsule TAKE 1 CAPSULE EVERY DAY 90 capsule 1  . Ferrous Sulfate (IRON) 325 (65 Fe) MG TABS Take 325 mg by mouth 2 (two) times daily. Petra Kuba made brand-  Pt takes one tablet twice a day     . fexofenadine (ALLEGRA) 180 MG tablet Take 180 mg by mouth daily as needed for allergies.     . fluticasone (FLONASE) 50 MCG/ACT nasal spray Place 2 sprays into both nostrils daily. 48 g 1  . glipiZIDE (GLUCOTROL XL) 10 MG 24 hr tablet TAKE 1 TABLET EVERY DAY WITH BREAKFAST 90 tablet 1  . insulin NPH Human (HUMULIN N,NOVOLIN N) 100 UNIT/ML injection Inject 15-20 Units into the skin 2 (two) times daily before a meal. 15 units in the morning and 20 units at bedtime    . Insulin Pen Needle (PEN NEEDLES) 31G X 6 MM MISC Use for insulin pen 100 each 2  . Insulin Syringe-Needle U-100 (INSULIN SYRINGE .5CC/31GX5/16") 31G X 5/16" 0.5 ML MISC Use as directed. Use two times daily.    . isosorbide mononitrate (IMDUR) 60 MG 24 hr tablet Take 1 tablet (60 mg total) by mouth daily. 90 tablet 1  . lisinopril (PRINIVIL,ZESTRIL) 40 MG tablet Take 1 tablet (40  mg total) by mouth daily. 90 tablet 1  . meloxicam (MOBIC) 7.5 MG tablet Take by mouth.    . metFORMIN (GLUCOPHAGE-XR) 500 MG 24 hr tablet Take 2,000 tablets by mouth daily with supper.     . metoprolol succinate (TOPROL-XL) 50 MG 24 hr tablet Take 1 tablet (50 mg total) by mouth daily. 90 tablet 3  . pantoprazole (PROTONIX) 40 MG tablet TAKE 1 TABLET EVERY MORNING 90 tablet 1  . rosuvastatin (CRESTOR) 20 MG tablet Take 1 tablet (20 mg total) by mouth daily. 90 tablet 1  . traMADol (ULTRAM) 50 MG tablet Take 1 tablet (50 mg total) by mouth every 8 (eight) hours as needed. 30 tablet 0  . venlafaxine XR (EFFEXOR-XR) 37.5 MG 24 hr capsule Take 1-2 capsules (37.5-75 mg total) by mouth daily with breakfast. First week take one with Celexa after that take 2 daily and stop celexa 7 capsule 0   No current facility-administered medications on file prior to visit.     There are no Patient Instructions on file for this visit. No Follow-up on file.    A , PA-C

## 2017-06-05 DIAGNOSIS — L851 Acquired keratosis [keratoderma] palmaris et plantaris: Secondary | ICD-10-CM | POA: Diagnosis not present

## 2017-06-05 DIAGNOSIS — E1142 Type 2 diabetes mellitus with diabetic polyneuropathy: Secondary | ICD-10-CM | POA: Diagnosis not present

## 2017-06-05 DIAGNOSIS — B351 Tinea unguium: Secondary | ICD-10-CM | POA: Diagnosis not present

## 2017-06-06 ENCOUNTER — Ambulatory Visit (INDEPENDENT_AMBULATORY_CARE_PROVIDER_SITE_OTHER): Payer: Medicare HMO | Admitting: Family Medicine

## 2017-06-06 VITALS — BP 100/62 | HR 65

## 2017-06-06 DIAGNOSIS — I10 Essential (primary) hypertension: Secondary | ICD-10-CM | POA: Diagnosis not present

## 2017-06-06 NOTE — Progress Notes (Signed)
Patient came in today for follow up blood pressure check.  Her blood pressure was 100/62 and pulse 65. She states that she feels well. Denies chest pain, sob and palpitations.

## 2017-06-20 ENCOUNTER — Ambulatory Visit (INDEPENDENT_AMBULATORY_CARE_PROVIDER_SITE_OTHER): Payer: Medicare HMO

## 2017-06-20 ENCOUNTER — Encounter (INDEPENDENT_AMBULATORY_CARE_PROVIDER_SITE_OTHER): Payer: Self-pay | Admitting: Vascular Surgery

## 2017-06-20 ENCOUNTER — Ambulatory Visit (INDEPENDENT_AMBULATORY_CARE_PROVIDER_SITE_OTHER): Payer: Medicare HMO | Admitting: Vascular Surgery

## 2017-06-20 VITALS — BP 129/75 | HR 59 | Resp 15 | Ht 65.5 in | Wt 226.0 lb

## 2017-06-20 DIAGNOSIS — I739 Peripheral vascular disease, unspecified: Secondary | ICD-10-CM | POA: Diagnosis not present

## 2017-06-20 DIAGNOSIS — E1143 Type 2 diabetes mellitus with diabetic autonomic (poly)neuropathy: Secondary | ICD-10-CM | POA: Diagnosis not present

## 2017-06-20 DIAGNOSIS — E785 Hyperlipidemia, unspecified: Secondary | ICD-10-CM | POA: Diagnosis not present

## 2017-06-20 NOTE — Progress Notes (Signed)
Subjective:    Patient ID: Dana Sparks, female    DOB: September 04, 1951, 66 y.o.   MRN: [id number] Chief Complaint  Patient presents with  . Follow-up    ABI    Patient presents to review vascular studies. She was last seen on 06/04/2017 for evaluation of bilateral calf claudication. Her symptoms remain stable they have not worsened. She denies any rest pain or ulcerations the lower extremity. The patient underwent a bilateral lower extremity ABI which is notable for bilateral triphasic tibials No significant lower extremity arterial disease. Bilateral toe brachial indices are normal. Patient denies any fever, nausea or vomiting.   Review of Systems  Constitutional: Negative.   HENT: Negative.   Eyes: Negative.   Respiratory: Negative.   Cardiovascular:       Bilateral calf claudication  Gastrointestinal: Negative.   Endocrine: Negative.   Genitourinary: Negative.   Musculoskeletal: Negative.   Skin: Negative.   Allergic/Immunologic: Negative.   Neurological: Negative.   Hematological: Negative.   Psychiatric/Behavioral: Negative.       Objective:   Physical Exam  Vitals reviewed. Please note the physical exam has not changed when compared to 06/04/2017.  Constitutional: She is oriented to person, place, and time. She appears well-developed and well-nourished. No distress.  HENT:  Head: Normocephalic and atraumatic.  Eyes: Pupils are equal, round, and reactive to light. Conjunctivae are normal.  Neck: Normal range of motion.  Cardiovascular: Normal rate, regular rhythm, normal heart sounds and intact distal pulses.   Pulses:      Radial pulses are 2+ on the right side, and 2+ on the left side.       Dorsalis pedis pulses are 1+ on the right side, and 1+ on the left side.       Posterior tibial pulses are 1+ on the right side, and 1+ on the left side.  Faint pedal pulses bilateally  Pulmonary/Chest: Effort normal.  Musculoskeletal: Normal range of motion. She exhibits no  edema.  Neurological: She is alert and oriented to person, place, and time.  Skin: Skin is warm and dry. She is not diaphoretic.  Psychiatric: She has a normal mood and affect. Her behavior is normal. Judgment and thought content normal.  Vitals reviewed.  BP 129/75 (BP Location: Left Arm)   Pulse (!) 59   Resp 15   Ht 5' 5.5" (1.664 m)   Wt 226 lb (102.5 kg)   BMI 37.04 kg/m   Past Medical History:  Diagnosis Date  . Allergy   . Anxiety   . Arthritis   . CAD (coronary artery disease)   . COPD (chronic obstructive pulmonary disease) ([place])   . Depression   . Diabetes mellitus without complication ([place])   . Diverticulitis   . Gastroparesis   . Gout   . Hyperlipemia   . Hypertension   . Positive H. pylori test   . Renal insufficiency   . Sleep apnea   . Thrombocytosis ([place]) 01/28/2015  . Tubular adenoma of colon 01/20/14   Social History   Social History  . Marital status: Married    Spouse name: N/A  . Number of children: N/A  . Years of education: N/A   Occupational History  . Not on file.   Social History Main Topics  . Smoking status: Former Smoker    Packs/day: 1.00    Years: 40.00    Types: Cigarettes    Quit date: 12/30/2012  . Smokeless tobacco: Never Used  . Alcohol use  No  . Drug use: No  . Sexual activity: Not Currently   Other Topics Concern  . Not on file   Social History Narrative  . No narrative on file   Past Surgical History:  Procedure Laterality Date  . ABDOMINAL HYSTERECTOMY     total  . CATARACT EXTRACTION    . COLONOSCOPY  01/2014   sigmoid diverticulosis. desc colon TA, hyperplastic polyp  . CORONARY ANGIOPLASTY WITH STENT PLACEMENT    . ESOPHAGOGASTRODUODENOSCOPY N/A 02/16/2015   negative h pylori, focal gastic intestinal metaplasia  . TOTAL KNEE ARTHROPLASTY Right    Family History  Problem Relation Age of Onset  . Cancer Sister        unsure  . Alcohol abuse Brother   . Hypertension Mother   . Diabetes Mother   . Heart  disease Mother   . Diabetes Father   . Heart disease Father   . Hypertension Father   . Colon cancer Neg Hx   . Liver disease Neg Hx   . Breast cancer Neg Hx    Allergies  Allergen Reactions  . Codeine Other (See Comments)    Unknown reaction  . Contrast Media [Iodinated Diagnostic Agents] Itching  . Sulfa Antibiotics Itching      Assessment & Plan:  Patient presents to review vascular studies. She was last seen on 06/04/2017 for evaluation of bilateral calf claudication. Her symptoms remain stable they have not worsened. She denies any rest pain or ulcerations the lower extremity. The patient underwent a bilateral lower extremity ABI which is notable for bilateral triphasic tibials No significant lower extremity arterial disease. Bilateral toe brachial indices are normal. Patient denies any fever, nausea or vomiting.  1. Claudication ([place]) - Stable No peripheral artery disease noted on ABI. Encouraged the patient to follow up with her primary care physician as her bilateral calf claudication may be related to osteoarthritis or spinal degenerative joint disease. Patient expresses her understanding I encouraged the patient to follow up in 2 years for a surveillance ABI due to her risk factors.  - VAS [place] ABI WITH/WO TBI; Future  2. Well controlled type 2 diabetes mellitus with gastroparesis ([place] - Stable) Encouraged good control as its slows the progression of atherosclerotic disease  3. Hyperlipidemia, unspecified hyperlipidemia type- stable Encouraged good control as its slows the progression of atherosclerotic disease   Current Outpatient Prescriptions on File Prior to Visit  Medication Sig Dispense Refill  . ACCU-CHEK FASTCLIX LANCETS MISC     . ACCU-CHEK SMARTVIEW test strip Check fsbs two times daily  DMII 100 each 6  . Alcohol Swabs (B-D SINGLE USE SWABS REGULAR) PADS 1 each by Does not apply route 4 (four) times daily. 200 each 2  . allopurinol (ZYLOPRIM) 100 MG tablet  Take 1 tablet (100 mg total) by mouth daily. 90 tablet 3  . aspirin EC 81 MG tablet Take 81 mg by mouth every morning.     . blood glucose meter kit and supplies KIT Dispense based on patient and insurance preference. Use up to four times daily as directed. (FOR ICD-9 250.00, 250.01). 1 each 0  . Coenzyme Q-10 100 MG capsule TAKE 1 CAPSULE EVERY DAY 90 capsule 1  . Ferrous Sulfate (IRON) 325 (65 Fe) MG TABS Take 325 mg by mouth 2 (two) times daily. Petra [place]uba made brand-  Pt takes one tablet twice a day     . fexofenadine (ALLEGRA) 180 MG tablet Take 180 mg by mouth daily as needed for  allergies.     . fluticasone (FLONASE) 50 MCG/ACT nasal spray Place 2 sprays into both nostrils daily. 48 g 1  . glipiZIDE (GLUCOTROL XL) 10 MG 24 hr tablet TAKE 1 TABLET EVERY DAY WITH BREAKFAST 90 tablet 1  . insulin NPH Human (HUMULIN N,NOVOLIN N) 100 UNIT/ML injection Inject 15-20 Units into the skin 2 (two) times daily before a meal. 15 units in the morning and 20 units at bedtime    . Insulin Pen Needle (PEN NEEDLES) 31G X 6 MM MISC Use for insulin pen 100 each 2  . Insulin Syringe-Needle U-100 (INSULIN SYRINGE .5CC/31GX5/16") 31G X 5/16" 0.5 ML MISC Use as directed. Use two times daily.    . isosorbide mononitrate (IMDUR) 60 MG 24 hr tablet Take 1 tablet (60 mg total) by mouth daily. 90 tablet 1  . lisinopril (PRINIVIL,ZESTRIL) 40 MG tablet Take 1 tablet (40 mg total) by mouth daily. 90 tablet 1  . meloxicam (MOBIC) 7.5 MG tablet Take by mouth.    . metFORMIN (GLUCOPHAGE-XR) 500 MG 24 hr tablet Take 2,000 tablets by mouth daily with supper.     . metoprolol succinate (TOPROL-XL) 50 MG 24 hr tablet Take 1 tablet (50 mg total) by mouth daily. 90 tablet 3  . pantoprazole (PROTONIX) 40 MG tablet TAKE 1 TABLET EVERY MORNING 90 tablet 1  . rosuvastatin (CRESTOR) 20 MG tablet Take 1 tablet (20 mg total) by mouth daily. 90 tablet 1  . traMADol (ULTRAM) 50 MG tablet Take 1 tablet (50 mg total) by mouth every 8 (eight)  hours as needed. 30 tablet 0  . venlafaxine XR (EFFEXOR-XR) 37.5 MG 24 hr capsule Take 1-2 capsules (37.5-75 mg total) by mouth daily with breakfast. First week take one with Celexa after that take 2 daily and stop celexa 7 capsule 0   No current facility-administered medications on file prior to visit.     There are no Patient Instructions on file for this visit. No Follow-up on file.   Alesha Jaffee A Dupree Givler, PA-C

## 2017-07-06 ENCOUNTER — Telehealth: Payer: Self-pay | Admitting: Family Medicine

## 2017-07-06 ENCOUNTER — Other Ambulatory Visit: Payer: Self-pay | Admitting: Family Medicine

## 2017-07-06 DIAGNOSIS — I209 Angina pectoris, unspecified: Secondary | ICD-10-CM

## 2017-07-06 DIAGNOSIS — F330 Major depressive disorder, recurrent, mild: Secondary | ICD-10-CM

## 2017-07-06 DIAGNOSIS — R232 Flushing: Secondary | ICD-10-CM

## 2017-07-06 NOTE — Telephone Encounter (Signed)
Refill request for general medication: Effexor  Last office visit: 06/06/2017  Last physical exam: none indicated  Follow up visit: 10/03/2017  Patient called about the sig on her bottle of Effexor. She called to clarify the instructions. She was suppose to take Celexa for one week with one of the Effexor but she didn't do it that way. She is now only taking Effexor at breakfast. She is out of medication and needs a refill on her Effexor. Last quantity dispensed was 7.

## 2017-07-06 NOTE — Telephone Encounter (Signed)
Refill request for Hypertension medication: IMDUR 60 mg  Last office visit pertaining to hypertension: 06/06/2017  BP Readings from Last 3 Encounters:  06/20/17 129/75  06/06/17 100/62  06/04/17 129/71     Lab Results  Component Value Date   CREATININE 0.92 02/27/2017   BUN 18 02/27/2017   NA 138 02/27/2017   K 4.7 02/27/2017   CL 106 02/27/2017   CO2 23 02/27/2017    Follow up appointment: 10/03/2017

## 2017-07-06 NOTE — Telephone Encounter (Signed)
Copied from [place] [id number]. Topic: Quick Communication - See Telephone Encounter >> Jul 06, 2017  2:21 PM Aurelio Brash B wrote: CRM for notification. See Telephone encounter for:  07/06/17.  PT has questions about medications  Venlafaxiner   celexa

## 2017-07-20 ENCOUNTER — Telehealth: Payer: Self-pay | Admitting: Family Medicine

## 2017-07-20 ENCOUNTER — Other Ambulatory Visit: Payer: Self-pay | Admitting: Family Medicine

## 2017-07-20 DIAGNOSIS — R232 Flushing: Secondary | ICD-10-CM

## 2017-07-20 DIAGNOSIS — F330 Major depressive disorder, recurrent, mild: Secondary | ICD-10-CM

## 2017-07-20 DIAGNOSIS — I10 Essential (primary) hypertension: Secondary | ICD-10-CM

## 2017-07-20 DIAGNOSIS — E1143 Type 2 diabetes mellitus with diabetic autonomic (poly)neuropathy: Secondary | ICD-10-CM

## 2017-07-20 NOTE — Telephone Encounter (Addendum)
Patient called to question dose of lisinopril. She stated that at her last visit Dr. Ancil Boozer told her to take 1/2 tab everyday.  I don't see these instructions. She wants the correct dosage( if it is suppose to be 20mg  every day) then she wants the prescription to [facility] to read that dosage.  Please route back to me so I can take care of all her request.  So sorry for the route back to me but we aren't doing [facility] refills.  This patient reported she phoned in for refills on Nov.2. All I could find was she had questions on meds. No refills were done at that time now she is out of venlafexine and wants [facility] [address] and the remainder of the refill sent to her mail order pharmacy.  Summary:  Needs 1 week of [facility] [address] [facility]  Glucophage ER and Lisinopril( with the new instructions, if there is any) to mail order phar. Venlafexine refill to mail order    Please call her re the venlafexine to the local pharmacy as she wants to know this has been done.

## 2017-07-20 NOTE — Telephone Encounter (Signed)
Message regarding dose of lisinopril has been routed to Dr. Ancil Boozer. She also has her prescription in her in basket.

## 2017-07-20 NOTE — Telephone Encounter (Addendum)
Refill request for diabetic medication:   Glipizide and Venlafaxine   Last office visit pertaining to diabetes: 05/31/2017   Lab Results  Component Value Date   HGBA1C 7.1 05/04/2017    Follow up visit: 10/03/2017   [facility] [address]  Dana Sparks called to question dose of lisinopril. She stated that at her last visit Dr. Ancil Boozer told her to take 1/2 tab everyday.  I don't see these instructions. She wants the correct dosage( if it is suppose to be 20mg  every day) then she wants the prescription to [facility] to read that dosage.  Please route back to me so I can take care of all her request.

## 2017-07-22 ENCOUNTER — Other Ambulatory Visit: Payer: Self-pay | Admitting: Family Medicine

## 2017-07-22 DIAGNOSIS — R232 Flushing: Secondary | ICD-10-CM

## 2017-07-22 DIAGNOSIS — F330 Major depressive disorder, recurrent, mild: Secondary | ICD-10-CM

## 2017-07-22 MED ORDER — LISINOPRIL 40 MG PO TABS: 40.0000 mg | ORAL_TABLET | Freq: Every day | ORAL | 1 refills | Status: DC

## 2017-07-22 NOTE — Progress Notes (Signed)
Please ask patient is she taking Effexor or Celexa?

## 2017-07-23 ENCOUNTER — Other Ambulatory Visit: Payer: Self-pay | Admitting: Family Medicine

## 2017-07-23 DIAGNOSIS — R232 Flushing: Secondary | ICD-10-CM

## 2017-07-23 DIAGNOSIS — F330 Major depressive disorder, recurrent, mild: Secondary | ICD-10-CM

## 2017-07-23 MED ORDER — VENLAFAXINE HCL ER 75 MG PO CP24: 75.0000 mg | ORAL_CAPSULE | Freq: Every day | ORAL | 0 refills | Status: DC

## 2017-07-23 NOTE — Progress Notes (Signed)
She is on Effexor.

## 2017-07-23 NOTE — Progress Notes (Signed)
TIFFANY IT IS EFFEXOR. COULD NOT OPEN TO ROUTE TO YOU.

## 2017-07-23 NOTE — Telephone Encounter (Signed)
Changed to XR 75 mg, to take only one daily, it went to [facility]

## 2017-07-23 NOTE — Progress Notes (Signed)
Left message for patient to call back regarding which medication she is taking currently.

## 2017-07-23 NOTE — Telephone Encounter (Signed)
Patient called back stating she was told to wing off of Celexa and now she is on Effexor twice daily. Please send into her pharmacy. Thanks.

## 2017-07-24 ENCOUNTER — Other Ambulatory Visit: Payer: Self-pay | Admitting: Family Medicine

## 2017-07-24 DIAGNOSIS — R232 Flushing: Secondary | ICD-10-CM

## 2017-07-24 DIAGNOSIS — F330 Major depressive disorder, recurrent, mild: Secondary | ICD-10-CM

## 2017-07-24 MED ORDER — VENLAFAXINE HCL ER 75 MG PO CP24: 75.0000 mg | ORAL_CAPSULE | Freq: Every day | ORAL | 0 refills | Status: DC

## 2017-07-25 NOTE — Telephone Encounter (Signed)
Copied from [place]. Topic: General - Other >> Jul 25, 2017  9:40 AM Carolyn Stare wrote:  Pt req a 10 day supply of the following med because her mail order will not arrive for another week and she is out of the medication    venlafaxine XR (EFFEXOR-XR) 75 MG 24 hr capsule   [facility] [place] [facility] [address]

## 2017-07-25 NOTE — Telephone Encounter (Signed)
Please call in 10 day supply to local pharmacy

## 2017-07-25 NOTE — Telephone Encounter (Signed)
[facility] and informed patient to only take 1 tablet 75 mg daily of Effexor daily.

## 2017-07-31 MED ORDER — VENLAFAXINE HCL ER 75 MG PO CP24: 75.0000 mg | ORAL_CAPSULE | Freq: Every day | ORAL | 0 refills | Status: DC

## 2017-07-31 NOTE — Addendum Note (Signed)
Addended by: Steele Sizer F on: 07/31/2017 04:45 PM   Modules accepted: Orders

## 2017-08-07 ENCOUNTER — Encounter: Payer: Self-pay | Admitting: Family Medicine

## 2017-08-07 ENCOUNTER — Ambulatory Visit (INDEPENDENT_AMBULATORY_CARE_PROVIDER_SITE_OTHER): Payer: Medicare HMO | Admitting: Family Medicine

## 2017-08-07 VITALS — BP 122/70 | HR 80 | Temp 98.4°F | Resp 18 | Ht 66.0 in | Wt 232.1 lb

## 2017-08-07 DIAGNOSIS — I209 Angina pectoris, unspecified: Secondary | ICD-10-CM | POA: Diagnosis not present

## 2017-08-07 DIAGNOSIS — R0609 Other forms of dyspnea: Secondary | ICD-10-CM | POA: Diagnosis not present

## 2017-08-07 NOTE — Progress Notes (Signed)
Name: Dana Sparks   MRN: [id number]    DOB: 05-Feb-1951   Date:08/07/2017       Progress Note  Subjective  Chief Complaint  Chief Complaint  Patient presents with  . Shortness of Breath    x 3 weeks. Denies any wheezing, palpations    HPI  SOB: she states SOB is only with activity, she states symptoms worse over the past couple of weeks. She states symptoms resolves with rest. Only occasionally associated with chest pain described as a tightness sensation ( hard to breath), no symptoms at night or during rest, denies PND. She denies associated palpitation. No wheezing. Able to walk 3 miles also going to water aerobics Able to do activity but needs to take breaks and also feels very tired after exercising. She used to smoke 1.5 pack daily for over 30 years but quit two years ago. She denies coughing.    Patient Active Problem List   Diagnosis Date Noted  . Claudication ([place]) 06/04/2017  . LVH (left ventricular hypertrophy) 02/27/2017  . Decreased cardiac ejection fraction 02/27/2017  . Left hand weakness 10/12/2016  . Elevated antinuclear antibody (ANA) level 04/27/2016  . Angina pectoris ([place]) 11/01/2015  . Well controlled type 2 diabetes mellitus with gastroparesis ([place]) 06/22/2015  . Low TSH level 06/22/2015  . Iron deficiency anemia 04/07/2015  . Intestinal metaplasia of gastric mucosa 03/11/2015  . CAD (coronary artery disease), native coronary artery 02/24/2015  . History of coronary artery stent placement 02/24/2015  . Chronic kidney disease, stage 3, mod decreased GFR (HCC) 02/24/2015  . Menopause 02/24/2015  . History of Helicobacter pylori infection 02/24/2015  . Mild mitral insufficiency 02/24/2015  . Mild tricuspid insufficiency 02/24/2015  . 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 ([place]) 02/24/2015  . Controlled gout 02/24/2015  . Depression, major, recurrent, mild ([place]) 02/24/2015  . Morbid obesity due to excess calories ([place]) 02/24/2015  .  Mild pulmonary hypertension ([place]) 02/24/2015  . Gastroesophageal reflux disease without esophagitis 02/24/2015  . Perennial allergic rhinitis 02/24/2015  . HLD (hyperlipidemia) 02/20/2015  . Hypertension, benign 02/20/2015  . Apnea, sleep 02/20/2015  . Controlled diabetes mellitus with stage 3 chronic kidney disease, without long-term current use of insulin ([place]) 02/20/2015    Past Surgical History:  Procedure Laterality Date  . ABDOMINAL HYSTERECTOMY     total  . CATARACT EXTRACTION    . COLONOSCOPY  01/2014   sigmoid diverticulosis. desc colon TA, hyperplastic polyp  . CORONARY ANGIOPLASTY WITH STENT PLACEMENT    . ESOPHAGOGASTRODUODENOSCOPY N/A 02/16/2015   negative h pylori, focal gastic intestinal metaplasia  . TOTAL KNEE ARTHROPLASTY Right     Family History  Problem Relation Age of Onset  . Cancer Sister        unsure  . Alcohol abuse Brother   . Hypertension Mother   . Diabetes Mother   . Heart disease Mother   . Diabetes Father   . Heart disease Father   . Hypertension Father   . Colon cancer Neg Hx   . Liver disease Neg Hx   . Breast cancer Neg Hx     Social History   Socioeconomic History  . Marital status: Married    Spouse name: Not on file  . Number of children: Not on file  . Years of education: Not on file  . Highest education level: Not on file  Social Needs  . Financial resource strain: Not on file  . Food insecurity - worry: Not  on file  . Food insecurity - inability: Not on file  . Transportation needs - medical: Not on file  . Transportation needs - non-medical: Not on file  Occupational History  . Not on file  Tobacco Use  . Smoking status: Former Smoker    Packs/day: 1.00    Years: 40.00    Pack years: 40.00    Types: Cigarettes    Last attempt to quit: 12/30/2012    Years since quitting: 4.6  . Smokeless tobacco: Never Used  Substance and Sexual Activity  . Alcohol use: No    Alcohol/week: 0.0 oz  . Drug use: No  . Sexual  activity: Not Currently  Other Topics Concern  . Not on file  Social History Narrative  . Not on file     Current Outpatient Medications:  .  ACCU-CHEK FASTCLIX LANCETS MISC, , Disp: , Rfl:  .  ACCU-CHEK SMARTVIEW test strip, Check fsbs two times daily  DMII, Disp: 100 each, Rfl: 6 .  Alcohol Swabs (B-D SINGLE USE SWABS REGULAR) PADS, 1 each by Does not apply route 4 (four) times daily., Disp: 200 each, Rfl: 2 .  allopurinol (ZYLOPRIM) 100 MG tablet, Take 1 tablet (100 mg total) by mouth daily., Disp: 90 tablet, Rfl: 3 .  amLODipine (NORVASC) 5 MG tablet, , Disp: , Rfl:  .  aspirin EC 81 MG tablet, Take 81 mg by mouth every morning. , Disp: , Rfl:  .  blood glucose meter kit and supplies KIT, Dispense based on patient and insurance preference. Use up to four times daily as directed. (FOR ICD-9 250.00, 250.01)., Disp: 1 each, Rfl: 0 .  Coconut Oil 1000 MG CAPS, Take 1 tablet by mouth 2 (two) times daily., Disp: , Rfl:  .  Coenzyme Q-10 100 MG capsule, TAKE 1 CAPSULE EVERY DAY, Disp: 90 capsule, Rfl: 1 .  cyclobenzaprine (FLEXERIL) 5 MG tablet, Begin only at night time, as may cause drowsiness. 1 tab twice a day for muscles spasm, Disp: , Rfl:  .  estradiol (ESTRACE) 0.5 MG tablet, Take by mouth., Disp: , Rfl:  .  Ferrous Sulfate (IRON) 325 (65 Fe) MG TABS, Take 325 mg by mouth 2 (two) times daily. Nature made brand-  Pt takes one tablet twice a day , Disp: , Rfl:  .  ferrous sulfate 325 (65 FE) MG tablet, Take 2 tablets by mouth daily., Disp: , Rfl:  .  fexofenadine (ALLEGRA) 180 MG tablet, Take 180 mg by mouth daily as needed for allergies. , Disp: , Rfl:  .  fluticasone (FLONASE) 50 MCG/ACT nasal spray, Place 2 sprays into both nostrils daily., Disp: 48 g, Rfl: 1 .  glipiZIDE (GLUCOTROL XL) 10 MG 24 hr tablet, TAKE 1 TABLET EVERY DAY WITH BREAKFAST, Disp: 90 tablet, Rfl: 1 .  glucose blood test strip, Use 2 (two) times daily., Disp: , Rfl:  .  insulin NPH Human (HUMULIN N,NOVOLIN N) 100  UNIT/ML injection, Inject 15-20 Units into the skin 2 (two) times daily before a meal. 15 units in the morning and 20 units at bedtime, Disp: , Rfl:  .  Insulin Pen Needle (PEN NEEDLES) 31G X 6 MM MISC, Use for insulin pen, Disp: 100 each, Rfl: 2 .  Insulin Syringe-Needle U-100 (INSULIN SYRINGE .5CC/31GX5/16") 31G X 5/16" 0.5 ML MISC, Use as directed. Use two times daily., Disp: , Rfl:  .  isosorbide mononitrate (IMDUR) 60 MG 24 hr tablet, Take 1 tablet (60 mg total) by mouth daily., Disp: 90  tablet, Rfl: 1 .  LANCETS MICRO THIN 33G MISC, Use 1 each 2 (two) times daily. accu chek. Diagnosis: E11.22, Disp: , Rfl:  .  lisinopril (PRINIVIL,ZESTRIL) 40 MG tablet, Take 1 tablet (40 mg total) daily by mouth., Disp: 90 tablet, Rfl: 1 .  loratadine (CLARITIN) 10 MG tablet, Take by mouth., Disp: , Rfl:  .  meloxicam (MOBIC) 7.5 MG tablet, Take by mouth., Disp: , Rfl:  .  metFORMIN (GLUCOPHAGE-XR) 500 MG 24 hr tablet, Take 2,000 tablets by mouth daily with supper. , Disp: , Rfl:  .  metoprolol succinate (TOPROL-XL) 50 MG 24 hr tablet, Take 1 tablet (50 mg total) by mouth daily., Disp: 90 tablet, Rfl: 3 .  pantoprazole (PROTONIX) 40 MG tablet, TAKE 1 TABLET EVERY MORNING, Disp: 90 tablet, Rfl: 1 .  rosuvastatin (CRESTOR) 20 MG tablet, Take 1 tablet (20 mg total) by mouth daily., Disp: 90 tablet, Rfl: 1 .  traMADol (ULTRAM) 50 MG tablet, Take 1 tablet (50 mg total) by mouth every 8 (eight) hours as needed., Disp: 30 tablet, Rfl: 0 .  venlafaxine XR (EFFEXOR-XR) 75 MG 24 hr capsule, Take 1 capsule (75 mg total) by mouth daily with breakfast. First week take one with Celexa after that take 2 daily and stop celexa, Disp: 90 capsule, Rfl: 0  Allergies  Allergen Reactions  . Codeine Other (See Comments)    Unknown reaction  . Contrast Media [Iodinated Diagnostic Agents] Itching  . Sulfa Antibiotics Itching     ROS  Ten systems reviewed and is negative except as mentioned in HPI   Objective  Vitals:    08/07/17 0953  BP: 122/70  Pulse: 80  Resp: 18  Temp: 98.4 F (36.9 C)  TempSrc: Oral  SpO2: 94%  Weight: 232 lb 1.6 oz (105.3 kg)  Height: '5\' 6"'  (1.676 m)    Body mass index is 37.46 kg/m.  Physical Exam  Constitutional: Patient appears well-developed and well-nourished. Obese  No distress.  HEENT: head atraumatic, normocephalic, pupils equal and reactive to light, neck supple, throat within normal limits Cardiovascular: Normal rate, regular rhythm and normal heart sounds.  No murmur heard. No BLE edema. Pulmonary/Chest: Effort normal and breath sounds normal. No respiratory distress. Abdominal: Soft.  There is no tenderness. Psychiatric: Patient has a normal mood and affect. behavior is normal. Judgment and thought content normal.  PHQ2/9: Depression screen [facility] 2/9 05/23/2017 01/19/2017 07/17/2016 06/13/2016 05/11/2016  Decreased Interest 0 0 0 0 1  Down, Depressed, Hopeless 0 0 0 0 0  PHQ - 2 Score 0 0 0 0 1     Fall Risk: Fall Risk  08/07/2017 05/31/2017 05/23/2017 01/19/2017 07/17/2016  Falls in the past year? No No No Yes No  Number falls in past yr: - - - 1 -  Injury with Fall? - - - Yes -  Comment - - - Right Thumb from ice -     Functional Status Survey: Is the patient deaf or have difficulty hearing?: No Does the patient have difficulty seeing, even when wearing glasses/contacts?: No Does the patient have difficulty concentrating, remembering, or making decisions?: No Does the patient have difficulty walking or climbing stairs?: No Does the patient have difficulty dressing or bathing?: No Does the patient have difficulty doing errands alone such as visiting a doctor's office or shopping?: No   Assessment & Plan  1. Dyspnea on exertion  - Spirometry: Pre & Post Eval - CBC - B Nat Peptide Normal spirometry, if labs are normal  we will refer to pulmonologist or back to cardiologist   2. Angina pectoris (HCC)  Sees Dr. Clayborn Bigness, recent visit, but if other  labs normal need to refer her back

## 2017-08-08 LAB — CBC
HEMATOCRIT: 38.6 % (ref 35.0–45.0)
Hemoglobin: 13.1 g/dL (ref 11.7–15.5)
MCH: 30.4 pg (ref 27.0–33.0)
MCHC: 33.9 g/dL (ref 32.0–36.0)
MCV: 89.6 fL (ref 80.0–100.0)
MPV: 9.9 fL (ref 7.5–12.5)
Platelets: 297 10*3/uL (ref 140–400)
RBC: 4.31 10*6/uL (ref 3.80–5.10)
RDW: 13.6 % (ref 11.0–15.0)
WBC: 8.4 10*3/uL (ref 3.8–10.8)

## 2017-08-08 LAB — BRAIN NATRIURETIC PEPTIDE: BRAIN NATRIURETIC PEPTIDE: 23 pg/mL (ref <100)

## 2017-08-20 ENCOUNTER — Other Ambulatory Visit: Payer: Self-pay

## 2017-08-20 ENCOUNTER — Telehealth: Payer: Self-pay | Admitting: Family Medicine

## 2017-08-20 DIAGNOSIS — J3089 Other allergic rhinitis: Secondary | ICD-10-CM

## 2017-08-20 MED ORDER — FLUTICASONE PROPIONATE 50 MCG/ACT NA SUSP: 2.0000 | Freq: Every day | NASAL | 2 refills | Status: DC

## 2017-08-20 NOTE — Telephone Encounter (Signed)
Copied from [place] #[id number]. Topic: Quick Communication - See Telephone Encounter >> Aug 20, 2017  8:38 AM Corie Chiquito, [place] wrote: CRM for notification. See Telephone encounter for: Patient calling because she has been in contact with [facility] to get her refill on her Flonase but she was told that they had faxed over a refill request but hasn't heard anything back about it. If someone could please give her a call back at [phone]  08/20/17.

## 2017-08-20 NOTE — Telephone Encounter (Signed)
Refill request for general medication: Flonase nasal spray  Last office visit: 08/07/2017  Last physical exam: None indicated  Follow up visit: 10/03/2017

## 2017-09-03 ENCOUNTER — Encounter: Payer: Self-pay | Admitting: Family Medicine

## 2017-09-03 ENCOUNTER — Ambulatory Visit (INDEPENDENT_AMBULATORY_CARE_PROVIDER_SITE_OTHER): Payer: Medicare HMO | Admitting: Family Medicine

## 2017-09-03 VITALS — BP 144/68 | HR 84 | Temp 97.6°F | Ht 66.0 in | Wt 234.5 lb

## 2017-09-03 DIAGNOSIS — D171 Benign lipomatous neoplasm of skin and subcutaneous tissue of trunk: Secondary | ICD-10-CM | POA: Diagnosis not present

## 2017-09-03 DIAGNOSIS — J339 Nasal polyp, unspecified: Secondary | ICD-10-CM | POA: Diagnosis not present

## 2017-09-03 DIAGNOSIS — E118 Type 2 diabetes mellitus with unspecified complications: Secondary | ICD-10-CM | POA: Insufficient documentation

## 2017-09-03 DIAGNOSIS — I10 Essential (primary) hypertension: Secondary | ICD-10-CM

## 2017-09-03 DIAGNOSIS — J069 Acute upper respiratory infection, unspecified: Secondary | ICD-10-CM

## 2017-09-03 DIAGNOSIS — E1149 Type 2 diabetes mellitus with other diabetic neurological complication: Secondary | ICD-10-CM | POA: Insufficient documentation

## 2017-09-03 MED ORDER — AMOXICILLIN-POT CLAVULANATE 875-125 MG PO TABS: 1.0000 | ORAL_TABLET | Freq: Two times a day (BID) | ORAL | 0 refills | Status: AC

## 2017-09-03 NOTE — Progress Notes (Signed)
BP (!) 144/68 (BP Location: Right Arm, Patient Position: Sitting, Cuff Size: Large)   Pulse 84   Temp 97.6 F (36.4 C) (Oral)   Ht 5\' 6"  (1.676 m)   Wt 234 lb 8 oz (106.4 kg)   SpO2 96%   BMI 37.85 kg/m    Subjective:    Patient ID: Dana Sparks, female    DOB: Jan 24, 1951, 66 y.o.   MRN: [id number]  HPI: Dana Sparks is a 66 y.o. female  Chief Complaint  Patient presents with  . URI    runny nose, headache, sneezing     HPI  Patient is seen for an acute visit today She has really runny nose, headache, sneezing No fevers No body aches She does not think this is the flu No travel No visits to nursing home or daycares; death in the family so around a lot of people Has not tried anything for her symptoms Using nasal spray, fexofenadine  Depression screen [facility] 2/9 09/03/2017 05/23/2017 01/19/2017 07/17/2016 06/13/2016  Decreased Interest 0 0 0 0 0  Down, Depressed, Hopeless 0 0 0 0 0  PHQ - 2 Score 0 0 0 0 0    Relevant past medical, surgical, family and social history reviewed Past Medical History:  Diagnosis Date  . Allergy   . Anxiety   . Arthritis   . CAD (coronary artery disease)   . COPD (chronic obstructive pulmonary disease) ([place])   . Depression   . Diabetes mellitus without complication ([place])   . Diverticulitis   . Gastroparesis   . Gout   . Hyperlipemia   . Hypertension   . Positive H. pylori test   . Renal insufficiency   . Sleep apnea   . Thrombocytosis ([place]) 01/28/2015  . Tubular adenoma of colon 01/20/14   Past Surgical History:  Procedure Laterality Date  . ABDOMINAL HYSTERECTOMY     total  . CATARACT EXTRACTION    . COLONOSCOPY  01/2014   sigmoid diverticulosis. desc colon TA, hyperplastic polyp  . CORONARY ANGIOPLASTY WITH STENT PLACEMENT    . ESOPHAGOGASTRODUODENOSCOPY N/A 02/16/2015   negative h pylori, focal gastic intestinal metaplasia  . TOTAL KNEE ARTHROPLASTY Right    Family History  Problem Relation Age of Onset  . Cancer Sister         unsure  . Alcohol abuse Brother   . Hypertension Mother   . Diabetes Mother   . Heart disease Mother   . Diabetes Father   . Heart disease Father   . Hypertension Father   . Colon cancer Neg Hx   . Liver disease Neg Hx   . Breast cancer Neg Hx    Social History   Tobacco Use  . Smoking status: Former Smoker    Packs/day: 1.00    Years: 40.00    Pack years: 40.00    Types: Cigarettes    Last attempt to quit: 12/30/2012    Years since quitting: 4.6  . Smokeless tobacco: Never Used  Substance Use Topics  . Alcohol use: No    Alcohol/week: 0.0 oz  . Drug use: No    Interim medical history since last visit reviewed. Allergies and medications reviewed  Review of Systems Per HPI unless specifically indicated above     Objective:    BP (!) 144/68 (BP Location: Right Arm, Patient Position: Sitting, Cuff Size: Large)   Pulse 84   Temp 97.6 F (36.4 C) (Oral)   Ht 5\' 6"  (1.676 m)  Wt 234 lb 8 oz (106.4 kg)   SpO2 96%   BMI 37.85 kg/m   Wt Readings from Last 3 Encounters:  09/03/17 234 lb 8 oz (106.4 kg)  08/07/17 232 lb 1.6 oz (105.3 kg)  06/20/17 226 lb (102.5 kg)    Physical Exam  Constitutional: She appears well-developed and well-nourished.  HENT:  Right Ear: Tympanic membrane and ear canal normal.  Left Ear: Tympanic membrane and ear canal normal.  Nose: Rhinorrhea present. No mucosal edema.  Mouth/Throat: Mucous membranes are normal. No oropharyngeal exudate, posterior oropharyngeal edema or posterior oropharyngeal erythema.  Large nasal polyp on the LEFT  Eyes: EOM are normal. No scleral icterus.  Cardiovascular: Normal rate and regular rhythm.  Pulmonary/Chest: Effort normal and breath sounds normal.  Skin: She is not diaphoretic. No pallor.  Soft mass on the upper back RIGHT of the midline  Psychiatric: She has a normal mood and affect. Her behavior is normal. Her mood appears not anxious.   Diabetic Foot Form - Detailed   Diabetic Foot Exam -  detailed Diabetic Foot exam was performed with the following findings:  Yes 09/03/2017 10:47 AM  Visual Foot Exam completed.:  Yes  Pulse Foot Exam completed.:  Yes  Right Dorsalis Pedis:  Present Left Dorsalis Pedis:  Present  Sensory Foot Exam Completed.:  Yes Semmes-Weinstein Monofilament Test R Site 1-Great Toe:  Pos L Site 1-Great Toe:  Pos             Assessment & Plan:   Problem List Items Addressed This Visit      Cardiovascular and Mediastinum   Hypertension, benign    Not quite to goal; AVOID decongestants        Respiratory   Nasal polyp    Left side; refer to ENT      Relevant Orders   Ambulatory referral to ENT     Other   Lipoma of back    Return to see Dr. Ancil Boozer, condsider surgical referral       Other Visit Diagnoses    Viral URI    -  Primary   explained antibiotics not needed for this; since it's a holiday tomorrow, will give Rx for ABX to use IF fever, purulent material; cautioned about risk of C dif       Follow up plan: No Follow-up on file.  An after-visit summary was printed and given to the patient at [facility].  Please see the patient instructions which may contain other information and recommendations beyond what is mentioned above in the assessment and plan.  Meds ordered this encounter  Medications  . amoxicillin-clavulanate (AUGMENTIN) 875-125 MG tablet    Sig: Take 1 tablet by mouth 2 (two) times daily for 10 days.    Dispense:  20 tablet    Refill:  0    Orders Placed This Encounter  Procedures  . Ambulatory referral to ENT

## 2017-09-03 NOTE — Assessment & Plan Note (Signed)
Left side; refer to ENT

## 2017-09-03 NOTE — Assessment & Plan Note (Signed)
Return to see Dr. Ancil Boozer, condsider surgical referral

## 2017-09-03 NOTE — Patient Instructions (Addendum)
Try to use PLAIN allergy medicine without the decongestant Avoid: phenylephrine, phenylpropanolamine, and pseudoephredine Only use the antibiotic if you develop fever or nasty drainage from the nose We'll have you see the ear nose throat doctor If you do start the antibiotic: Please do eat yogurt or kimchi or take a probiotic daily for the next month We want to replace the healthy germs in the gut If you notice foul, watery diarrhea in the next two months, schedule an appointment RIGHT AWAY or go to an urgent care or the emergency room if a holiday or over a weekend

## 2017-09-03 NOTE — Assessment & Plan Note (Signed)
Not quite to goal; AVOID decongestants

## 2017-09-05 DIAGNOSIS — L851 Acquired keratosis [keratoderma] palmaris et plantaris: Secondary | ICD-10-CM | POA: Diagnosis not present

## 2017-09-05 DIAGNOSIS — E1142 Type 2 diabetes mellitus with diabetic polyneuropathy: Secondary | ICD-10-CM | POA: Diagnosis not present

## 2017-09-05 DIAGNOSIS — B351 Tinea unguium: Secondary | ICD-10-CM | POA: Diagnosis not present

## 2017-09-10 DIAGNOSIS — E119 Type 2 diabetes mellitus without complications: Secondary | ICD-10-CM | POA: Diagnosis not present

## 2017-09-10 LAB — HM DIABETES EYE EXAM

## 2017-09-11 ENCOUNTER — Encounter: Payer: Self-pay | Admitting: Family Medicine

## 2017-09-13 DIAGNOSIS — G4733 Obstructive sleep apnea (adult) (pediatric): Secondary | ICD-10-CM | POA: Diagnosis not present

## 2017-09-24 DIAGNOSIS — J343 Hypertrophy of nasal turbinates: Secondary | ICD-10-CM | POA: Diagnosis not present

## 2017-09-24 DIAGNOSIS — J301 Allergic rhinitis due to pollen: Secondary | ICD-10-CM | POA: Diagnosis not present

## 2017-10-03 ENCOUNTER — Encounter: Payer: Self-pay | Admitting: Family Medicine

## 2017-10-03 ENCOUNTER — Ambulatory Visit (INDEPENDENT_AMBULATORY_CARE_PROVIDER_SITE_OTHER): Payer: Medicare HMO | Admitting: Family Medicine

## 2017-10-03 VITALS — BP 130/68 | HR 89 | Resp 14 | Ht 66.0 in | Wt 232.2 lb

## 2017-10-03 DIAGNOSIS — E1169 Type 2 diabetes mellitus with other specified complication: Secondary | ICD-10-CM

## 2017-10-03 DIAGNOSIS — F330 Major depressive disorder, recurrent, mild: Secondary | ICD-10-CM | POA: Diagnosis not present

## 2017-10-03 DIAGNOSIS — G4733 Obstructive sleep apnea (adult) (pediatric): Secondary | ICD-10-CM | POA: Diagnosis not present

## 2017-10-03 DIAGNOSIS — I209 Angina pectoris, unspecified: Secondary | ICD-10-CM

## 2017-10-03 DIAGNOSIS — E785 Hyperlipidemia, unspecified: Secondary | ICD-10-CM

## 2017-10-03 DIAGNOSIS — I739 Peripheral vascular disease, unspecified: Secondary | ICD-10-CM | POA: Diagnosis not present

## 2017-10-03 DIAGNOSIS — N183 Chronic kidney disease, stage 3 (moderate): Secondary | ICD-10-CM | POA: Diagnosis not present

## 2017-10-03 DIAGNOSIS — D508 Other iron deficiency anemias: Secondary | ICD-10-CM | POA: Diagnosis not present

## 2017-10-03 DIAGNOSIS — I10 Essential (primary) hypertension: Secondary | ICD-10-CM

## 2017-10-03 DIAGNOSIS — M109 Gout, unspecified: Secondary | ICD-10-CM | POA: Diagnosis not present

## 2017-10-03 DIAGNOSIS — R7989 Other specified abnormal findings of blood chemistry: Secondary | ICD-10-CM | POA: Diagnosis not present

## 2017-10-03 DIAGNOSIS — I517 Cardiomegaly: Secondary | ICD-10-CM

## 2017-10-03 DIAGNOSIS — E1122 Type 2 diabetes mellitus with diabetic chronic kidney disease: Secondary | ICD-10-CM | POA: Diagnosis not present

## 2017-10-03 DIAGNOSIS — R232 Flushing: Secondary | ICD-10-CM

## 2017-10-03 DIAGNOSIS — K219 Gastro-esophageal reflux disease without esophagitis: Secondary | ICD-10-CM

## 2017-10-03 MED ORDER — ALLOPURINOL 100 MG PO TABS: 100.0000 mg | ORAL_TABLET | Freq: Every day | ORAL | 3 refills | Status: DC

## 2017-10-03 MED ORDER — VENLAFAXINE HCL ER 150 MG PO CP24: 150.0000 mg | ORAL_CAPSULE | Freq: Every day | ORAL | 1 refills | Status: DC

## 2017-10-03 MED ORDER — ROSUVASTATIN CALCIUM 20 MG PO TABS: 20.0000 mg | ORAL_TABLET | Freq: Every day | ORAL | 1 refills | Status: DC

## 2017-10-03 MED ORDER — PANTOPRAZOLE SODIUM 40 MG PO TBEC: 40.0000 mg | DELAYED_RELEASE_TABLET | Freq: Every morning | ORAL | 1 refills | Status: DC

## 2017-10-03 MED ORDER — METOPROLOL SUCCINATE ER 50 MG PO TB24: 50.0000 mg | ORAL_TABLET | Freq: Every day | ORAL | 3 refills | Status: DC

## 2017-10-03 NOTE — Progress Notes (Signed)
Name: Dana Sparks   MRN: [id number]    DOB: 1950-11-01   Date:10/03/2017       Progress Note  Subjective  Chief Complaint  Chief Complaint  Patient presents with  . Hypertension  . Anemia  . Hot Flashes    HPI  Anemia: she was diagnosed earlier 2016 with h. Pylori, she was having a lot of dyspepsia. We tried treating her with antibiotics but she was unable to tolerate it. She was referred to Dr. Durwin Reges and Oncologist because of severe anemia. EGD was neg, she is taking ferrous sulfate twice daily and she states she has been compliant. No longer has epigastric pain. Dr. Mike Gip has recommended labs every 3 months, 3 hemoccult cards negative in 2017. Last CBC done at [facility] 01/2017 was normal, ferritin is also back to normal and she has been compliant with iron supplementation We will recheck labs today   DMII with gastroparesis/CKI: she is taking medication as prescribed, seeing Dr. Gabriel Carina now, she denies hypoglycemic episodes. She has been checking glucose at home, fasting is around 94160's. Her hgbA1C is elevated - last hgbA1C 7.3 % , 7.7% but now down to 6.8% last checked by endo was 7.1% in August, she is not sure of when her next visit is due with Dr. Gabriel Carina, we will check labs and send to Dr. Kennieth Rad is on insulin and glucotorox and has noticed hypoglycemia, polydipsia, or polyuria. She has episodes of polyphagia She is on Ace for diabetic CKI, she has a history of microalbuminuria. States gastroparesis symptoms are controlled - not on medication. Frozen shoulder on right side, improved with home PT and had steroid injection last week after a bursitis episode.  HTN: she denies dizziness, but bp is at goal,  no chest pain or SOB at this time.  Angina: CAD, her cardiologist is Dr. Clayborn Bigness, taking Imdur daily and Toprol XL and has chest pain once or twice a week, it lasts minutes and resolves by itself without SL nitroglycerin.  She is also on statin therapy, aspirin and ace. She is  on disability for it for many years, but now on social security.  Still sees Dr. Clayborn Bigness, reviewed records and Echo from 2016 showed decreased in EF 25-30% and global hypokinesis  OSA: she is using a CPAP machine every night, all night long.   Major Depression Mild: she denies crying spells. She has some fatigue, also anhedonia, she is taking antidepressants - Citalopram, she states she is sensitive to comments but able to talk to a relative or a friend and gets better quickly. She is not intimate with her husband - they don't sleep in the same room, but she is okay with that. She states they have a good relationship - not having sex is a mutual agreement. She states she has days that she worries a lot, but stable. She is now on Effexor 75 mg we will increase to 150 mg to help more with hot flashes She feels medication helps with depression   Morbid obesity: she lost a couple of pounds since last visit.  She has been more compliant with diet and exercise. Still walking daily, has claudication and referral to vascular surgeon is pending  Hyperlipidemia: taking statin therapy, needs a refill, repeat labs  Patient Active Problem List   Diagnosis Date Noted  . Type II or unspecified type diabetes mellitus with neurological manifestations, not stated as uncontrolled(250.60) 09/03/2017  . Nasal polyp 09/03/2017  . Lipoma of back 09/03/2017  . Claudication (  [place]) 06/04/2017  . LVH (left ventricular hypertrophy) 02/27/2017  . Decreased cardiac ejection fraction 02/27/2017  . Left hand weakness 10/12/2016  . Elevated antinuclear antibody (ANA) level 04/27/2016  . Angina pectoris ([place]) 11/01/2015  . Well controlled type 2 diabetes mellitus with gastroparesis ([place]) 06/22/2015  . Low TSH level 06/22/2015  . Iron deficiency anemia 04/07/2015  . Intestinal metaplasia of gastric mucosa 03/11/2015  . CAD (coronary artery disease), native coronary artery 02/24/2015  . History of coronary artery  stent placement 02/24/2015  . Chronic kidney disease, stage 3, mod decreased GFR (HCC) 02/24/2015  . Menopause 02/24/2015  . History of Helicobacter pylori infection 02/24/2015  . Mild mitral insufficiency 02/24/2015  . Mild tricuspid insufficiency 02/24/2015  . 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 ([place]) 02/24/2015  . Controlled gout 02/24/2015  . Depression, major, recurrent, mild ([place]) 02/24/2015  . Morbid obesity due to excess calories ([place]) 02/24/2015  . Mild pulmonary hypertension ([place]) 02/24/2015  . Gastroesophageal reflux disease without esophagitis 02/24/2015  . Perennial allergic rhinitis 02/24/2015  . HLD (hyperlipidemia) 02/20/2015  . Hypertension, benign 02/20/2015  . Apnea, sleep 02/20/2015  . Controlled diabetes mellitus with stage 3 chronic kidney disease, without long-term current use of insulin ([place]) 02/20/2015    Past Surgical History:  Procedure Laterality Date  . ABDOMINAL HYSTERECTOMY     total  . CATARACT EXTRACTION    . COLONOSCOPY  01/2014   sigmoid diverticulosis. desc colon TA, hyperplastic polyp  . CORONARY ANGIOPLASTY WITH STENT PLACEMENT    . ESOPHAGOGASTRODUODENOSCOPY N/A 02/16/2015   negative h pylori, focal gastic intestinal metaplasia  . TOTAL KNEE ARTHROPLASTY Right     Family History  Problem Relation Age of Onset  . Cancer Sister        unsure  . Alcohol abuse Brother   . Hypertension Mother   . Diabetes Mother   . Heart disease Mother   . Diabetes Father   . Heart disease Father   . Hypertension Father   . Colon cancer Neg Hx   . Liver disease Neg Hx   . Breast cancer Neg Hx     Social History   Socioeconomic History  . Marital status: Married    Spouse name: Not on file  . Number of children: Not on file  . Years of education: Not on file  . Highest education level: Not on file  Social Needs  . Financial resource strain: Not on file  . Food insecurity - worry: Not on file  . Food insecurity -  inability: Not on file  . Transportation needs - medical: Not on file  . Transportation needs - non-medical: Not on file  Occupational History  . Not on file  Tobacco Use  . Smoking status: Former Smoker    Packs/day: 1.00    Years: 40.00    Pack years: 40.00    Types: Cigarettes    Last attempt to quit: 12/30/2012    Years since quitting: 4.7  . Smokeless tobacco: Never Used  Substance and Sexual Activity  . Alcohol use: No    Alcohol/week: 0.0 oz  . Drug use: No  . Sexual activity: Not Currently  Other Topics Concern  . Not on file  Social History Narrative  . Not on file     Current Outpatient Medications:  .  ACCU-CHEK FASTCLIX LANCETS MISC, , Disp: , Rfl:  .  ACCU-CHEK SMARTVIEW test strip, Check fsbs two times daily  DMII, Disp: 100 each, Rfl:  6 .  Alcohol Swabs (B-D SINGLE USE SWABS REGULAR) PADS, 1 each by Does not apply route 4 (four) times daily., Disp: 200 each, Rfl: 2 .  allopurinol (ZYLOPRIM) 100 MG tablet, Take 1 tablet (100 mg total) by mouth daily., Disp: 90 tablet, Rfl: 3 .  aspirin EC 81 MG tablet, Take 81 mg by mouth every morning. , Disp: , Rfl:  .  blood glucose meter kit and supplies KIT, Dispense based on patient and insurance preference. Use up to four times daily as directed. (FOR ICD-9 250.00, 250.01)., Disp: 1 each, Rfl: 0 .  Coenzyme Q-10 100 MG capsule, TAKE 1 CAPSULE EVERY DAY, Disp: 90 capsule, Rfl: 1 .  Ferrous Sulfate (IRON) 325 (65 Fe) MG TABS, Take 325 mg by mouth 2 (two) times daily. Nature made brand-  Pt takes one tablet twice a day , Disp: , Rfl:  .  fluticasone (FLONASE) 50 MCG/ACT nasal spray, Place 2 sprays into both nostrils daily., Disp: 48 g, Rfl: 2 .  glipiZIDE (GLUCOTROL XL) 10 MG 24 hr tablet, TAKE 1 TABLET EVERY DAY WITH BREAKFAST, Disp: 90 tablet, Rfl: 1 .  glucose blood test strip, Use 2 (two) times daily., Disp: , Rfl:  .  insulin NPH Human (HUMULIN N,NOVOLIN N) 100 UNIT/ML injection, Inject 15-20 Units into the skin 2 (two)  times daily before a meal. 15 units in the morning and 20 units at bedtime, Disp: , Rfl:  .  Insulin Syringe-Needle U-100 (INSULIN SYRINGE .5CC/31GX5/16") 31G X 5/16" 0.5 ML MISC, Use as directed. Use two times daily., Disp: , Rfl:  .  isosorbide mononitrate (IMDUR) 60 MG 24 hr tablet, Take 1 tablet (60 mg total) by mouth daily., Disp: 90 tablet, Rfl: 1 .  LANCETS MICRO THIN 33G MISC, Use 1 each 2 (two) times daily. accu chek. Diagnosis: E11.22, Disp: , Rfl:  .  lisinopril (PRINIVIL,ZESTRIL) 40 MG tablet, Take 1 tablet (40 mg total) daily by mouth., Disp: 90 tablet, Rfl: 1 .  loratadine (CLARITIN) 10 MG tablet, Take by mouth., Disp: , Rfl:  .  meloxicam (MOBIC) 7.5 MG tablet, Take by mouth., Disp: , Rfl:  .  metFORMIN (GLUCOPHAGE-XR) 500 MG 24 hr tablet, Take 2,000 tablets by mouth daily with supper., Disp: , Rfl:  .  metoprolol succinate (TOPROL-XL) 50 MG 24 hr tablet, Take 1 tablet (50 mg total) by mouth daily., Disp: 90 tablet, Rfl: 3 .  pantoprazole (PROTONIX) 40 MG tablet, Take 1 tablet (40 mg total) by mouth every morning., Disp: 90 tablet, Rfl: 1 .  rosuvastatin (CRESTOR) 20 MG tablet, Take 1 tablet (20 mg total) by mouth daily., Disp: 90 tablet, Rfl: 1 .  traMADol (ULTRAM) 50 MG tablet, Take 1 tablet (50 mg total) by mouth every 8 (eight) hours as needed., Disp: 30 tablet, Rfl: 0 .  venlafaxine XR (EFFEXOR-XR) 150 MG 24 hr capsule, Take 1 capsule (150 mg total) by mouth daily with breakfast. First week take one with Celexa after that take 2 daily and stop celexa, Disp: 90 capsule, Rfl: 1 .  ipratropium (ATROVENT) 0.03 % nasal spray, Place 2 sprays into the nose 3 (three) times daily., Disp: , Rfl:  .  montelukast (SINGULAIR) 10 MG tablet, Take 1 tablet by mouth every evening., Disp: , Rfl:   Allergies  Allergen Reactions  . Codeine Other (See Comments)    Unknown reaction  . Contrast Media [Iodinated Diagnostic Agents] Itching  . Sulfa Antibiotics Itching     ROS  Constitutional:  Negative  for fever or weight change.  Respiratory: Negative for cough and shortness of breath.   Cardiovascular: Negative for chest pain or palpitations.  Gastrointestinal: Negative for abdominal pain, no bowel changes.  Musculoskeletal: Negative for gait problem or joint swelling.  Skin: Negative for rash.  Neurological: Negative for dizziness or headache.  No other specific complaints in a complete review of systems (except as listed in HPI above).  Objective  Vitals:   10/03/17 0917  BP: 130/68  Pulse: 89  Resp: 14  SpO2: 97%  Weight: 232 lb 3.2 oz (105.3 kg)  Height: '5\' 6"'  (1.676 m)    Body mass index is 37.48 kg/m.  Physical Exam  Constitutional: Patient appears well-developed and well-nourished. Obese No distress.  HEENT: head atraumatic, normocephalic, pupils equal and reactive to light,neck supple, throat within normal limits Cardiovascular: Normal rate, regular rhythm and normal heart sounds.  No murmur heard. No BLE edema. Pulmonary/Chest: Effort normal and breath sounds normal. No respiratory distress. Abdominal: Soft.  There is no tenderness. Psychiatric: Patient has a normal mood and affect. behavior is normal. Judgment and thought content normal.  Recent Results (from the past 2160 hour(s))  CBC     Status: None   Collection Time: 08/07/17 11:20 AM  Result Value Ref Range   WBC 8.4 3.8 - 10.8 Thousand/uL   RBC 4.31 3.80 - 5.10 Million/uL   Hemoglobin 13.1 11.7 - 15.5 g/dL   HCT 38.6 35.0 - 45.0 %   MCV 89.6 80.0 - 100.0 fL   MCH 30.4 27.0 - 33.0 pg   MCHC 33.9 32.0 - 36.0 g/dL   RDW 13.6 11.0 - 15.0 %   Platelets 297 140 - 400 Thousand/uL   MPV 9.9 7.5 - 12.5 fL  B Nat Peptide     Status: None   Collection Time: 08/07/17 11:20 AM  Result Value Ref Range   Brain Natriuretic Peptide 23 <100 pg/mL    Comment: . BNP levels increase with age in the general population with the highest values seen in individuals greater than 13 years of age. Reference: J.  Am. [place] Ar. Cardiol. 2002; 47:829-562. Marland Kitchen   HM DIABETES EYE EXAM     Status: None   Collection Time: 09/10/17 12:00 AM  Result Value Ref Range   HM Diabetic Eye Exam No Retinopathy No Retinopathy    Comment: [facility], Dr. Edison Pace      PHQ2/9: Depression screen [facility] 2/9 09/03/2017 05/23/2017 01/19/2017 07/17/2016 06/13/2016  Decreased Interest 0 0 0 0 0  Down, Depressed, Hopeless 0 0 0 0 0  PHQ - 2 Score 0 0 0 0 0     Fall Risk: Fall Risk  10/03/2017 09/03/2017 08/07/2017 05/31/2017 05/23/2017  Falls in the past year? No No No No No  Number falls in past yr: - - - - -  Injury with Fall? - - - - -  Comment - - - - -     Functional Status Survey: Is the patient deaf or have difficulty hearing?: No Does the patient have difficulty seeing, even when wearing glasses/contacts?: No Does the patient have difficulty concentrating, remembering, or making decisions?: No Does the patient have difficulty walking or climbing stairs?: No Does the patient have difficulty dressing or bathing?: No Does the patient have difficulty doing errands alone such as visiting a doctor's office or shopping?: No    Assessment & Plan  1. Controlled type 2 diabetes mellitus with stage 3 chronic kidney disease, without long-term current use of  insulin (HCC)  - Hemoglobin A1c  2. Depression, major, recurrent, mild (HCC)  We will increase dose from 75 mg to 150 mg daily  - venlafaxine XR (EFFEXOR-XR) 150 MG 24 hr capsule; Take 1 capsule (150 mg total) by mouth daily with breakfast. First week take one with Celexa after that take 2 daily and stop celexa  Dispense: 90 capsule; Refill: 1  3. Morbid obesity due to excess calories [facility])  Discussed with the patient the risk posed by an increased BMI. Discussed importance of portion control, calorie counting and at least 150 minutes of physical activity weekly. Avoid sweet beverages and drink more water. Eat at least 6 servings of fruit and vegetables daily    4. Angina pectoris (HCC)  Continue Imdur  5. Claudication ([place])  Stable, she is walking daily for about 3 miles and is doing well   6. Obstructive sleep apnea syndrome  Continue wearing CPAP every night   7. Hypertension, benign  - COMPLETE METABOLIC PANEL WITH GFR - metoprolol succinate (TOPROL-XL) 50 MG 24 hr tablet; Take 1 tablet (50 mg total) by mouth daily.  Dispense: 90 tablet; Refill: 3  8. Low TSH level  - TSH  9. Controlled gout  - allopurinol (ZYLOPRIM) 100 MG tablet; Take 1 tablet (100 mg total) by mouth daily.  Dispense: 90 tablet; Refill: 3  10. LVH (left ventricular hypertrophy)  Keep follow up cardiologist   11. Other iron deficiency anemia  - CBC with Differential/Platelet - Iron, TIBC and Ferritin Panel  12. Dyslipidemia associated with type 2 diabetes mellitus (HCC)  - Hemoglobin A1c - Lipid panel  13. Gastroesophageal reflux disease without esophagitis  - pantoprazole (PROTONIX) 40 MG tablet; Take 1 tablet (40 mg total) by mouth every morning.  Dispense: 90 tablet; Refill: 1  14. Dyslipidemia  - rosuvastatin (CRESTOR) 20 MG tablet; Take 1 tablet (20 mg total) by mouth daily.  Dispense: 90 tablet; Refill: 1  15. Hot flashes  - venlafaxine XR (EFFEXOR-XR) 150 MG 24 hr capsule; Take 1 capsule (150 mg total) by mouth daily with breakfast. First week take one with Celexa after that take 2 daily and stop celexa  Dispense: 90 capsule; Refill: 1

## 2017-10-04 LAB — CBC WITH DIFFERENTIAL/PLATELET
BASOS ABS: 29 {cells}/uL (ref 0–200)
Basophils Relative: 0.4 %
EOS ABS: 226 {cells}/uL (ref 15–500)
EOS PCT: 3.1 %
HCT: 40.1 % (ref 35.0–45.0)
HEMOGLOBIN: 13.7 g/dL (ref 11.7–15.5)
Lymphs Abs: 3468 cells/uL (ref 850–3900)
MCH: 30.4 pg (ref 27.0–33.0)
MCHC: 34.2 g/dL (ref 32.0–36.0)
MCV: 88.9 fL (ref 80.0–100.0)
MPV: 10.3 fL (ref 7.5–12.5)
Monocytes Relative: 6.3 %
NEUTROS ABS: 3117 {cells}/uL (ref 1500–7800)
Neutrophils Relative %: 42.7 %
PLATELETS: 278 10*3/uL (ref 140–400)
RBC: 4.51 10*6/uL (ref 3.80–5.10)
RDW: 12.3 % (ref 11.0–15.0)
TOTAL LYMPHOCYTE: 47.5 %
WBC mixed population: 460 cells/uL (ref 200–950)
WBC: 7.3 10*3/uL (ref 3.8–10.8)

## 2017-10-04 LAB — COMPLETE METABOLIC PANEL WITH GFR
AG Ratio: 1.8 (calc) (ref 1.0–2.5)
ALKALINE PHOSPHATASE (APISO): 60 U/L (ref 33–130)
ALT: 11 U/L (ref 6–29)
AST: 13 U/L (ref 10–35)
Albumin: 4.2 g/dL (ref 3.6–5.1)
BILIRUBIN TOTAL: 0.3 mg/dL (ref 0.2–1.2)
BUN: 13 mg/dL (ref 7–25)
CHLORIDE: 107 mmol/L (ref 98–110)
CO2: 25 mmol/L (ref 20–32)
CREATININE: 0.75 mg/dL (ref 0.50–0.99)
Calcium: 9.6 mg/dL (ref 8.6–10.4)
GFR, Est African American: 96 mL/min/{1.73_m2} (ref >=60)
GFR, Est Non African American: 83 mL/min/{1.73_m2} (ref >=60)
GLUCOSE: 125 mg/dL — AB (ref 65–99)
Globulin: 2.3 g/dL (calc) (ref 1.9–3.7)
Potassium: 4.2 mmol/L (ref 3.5–5.3)
Sodium: 143 mmol/L (ref 135–146)
Total Protein: 6.5 g/dL (ref 6.1–8.1)

## 2017-10-04 LAB — IRON,TIBC AND FERRITIN PANEL
%SAT: 17 % (calc) (ref 11–50)
FERRITIN: 21 ng/mL (ref 20–288)
Iron: 59 ug/dL (ref 45–160)
TIBC: 342 mcg/dL (calc) (ref 250–450)

## 2017-10-04 LAB — LIPID PANEL
CHOL/HDL RATIO: 2.4 (calc) (ref <5.0)
CHOLESTEROL: 128 mg/dL (ref <200)
HDL: 54 mg/dL (ref >50)
LDL CHOLESTEROL (CALC): 55 mg/dL
Non-HDL Cholesterol (Calc): 74 mg/dL (calc) (ref <130)
TRIGLYCERIDES: 107 mg/dL (ref <150)

## 2017-10-04 LAB — HEMOGLOBIN A1C
HEMOGLOBIN A1C: 7.3 %{Hb} — AB (ref <5.7)
Mean Plasma Glucose: 163 (calc)
eAG (mmol/L): 9 (calc)

## 2017-10-04 LAB — TSH: TSH: 0.45 mIU/L (ref 0.40–4.50)

## 2017-10-05 ENCOUNTER — Other Ambulatory Visit: Payer: Self-pay | Admitting: Family Medicine

## 2017-10-05 DIAGNOSIS — M791 Myalgia, unspecified site: Secondary | ICD-10-CM

## 2017-10-05 NOTE — Telephone Encounter (Signed)
Refill request for general medication: Co-Q 10 100 mg  Last office visit: 10/03/2017  Last physical exam: None indicated  Follow-up on file. 01/04/2018

## 2017-10-16 DIAGNOSIS — E1142 Type 2 diabetes mellitus with diabetic polyneuropathy: Secondary | ICD-10-CM | POA: Diagnosis not present

## 2017-10-16 DIAGNOSIS — E785 Hyperlipidemia, unspecified: Secondary | ICD-10-CM | POA: Diagnosis not present

## 2017-10-16 DIAGNOSIS — E1159 Type 2 diabetes mellitus with other circulatory complications: Secondary | ICD-10-CM | POA: Diagnosis not present

## 2017-10-16 DIAGNOSIS — I10 Essential (primary) hypertension: Secondary | ICD-10-CM | POA: Diagnosis not present

## 2017-10-25 ENCOUNTER — Encounter: Payer: Self-pay | Admitting: Family Medicine

## 2017-10-25 ENCOUNTER — Ambulatory Visit (INDEPENDENT_AMBULATORY_CARE_PROVIDER_SITE_OTHER): Payer: Medicare HMO | Admitting: Family Medicine

## 2017-10-25 VITALS — BP 128/72 | HR 89 | Temp 98.2°F | Resp 18 | Ht 66.0 in | Wt 231.5 lb

## 2017-10-25 DIAGNOSIS — J40 Bronchitis, not specified as acute or chronic: Secondary | ICD-10-CM

## 2017-10-25 DIAGNOSIS — E1122 Type 2 diabetes mellitus with diabetic chronic kidney disease: Secondary | ICD-10-CM | POA: Diagnosis not present

## 2017-10-25 DIAGNOSIS — N183 Chronic kidney disease, stage 3 unspecified: Secondary | ICD-10-CM

## 2017-10-25 MED ORDER — DOXYCYCLINE HYCLATE 100 MG PO TABS: 100.0000 mg | ORAL_TABLET | Freq: Two times a day (BID) | ORAL | 0 refills | Status: DC

## 2017-10-25 MED ORDER — BUDESONIDE-FORMOTEROL FUMARATE 80-4.5 MCG/ACT IN AERO: 2.0000 | INHALATION_SPRAY | Freq: Two times a day (BID) | RESPIRATORY_TRACT | 3 refills | Status: DC

## 2017-10-25 MED ORDER — PROMETHAZINE-DM 6.25-15 MG/5ML PO SYRP: 5.0000 mL | ORAL_SOLUTION | Freq: Three times a day (TID) | ORAL | 0 refills | Status: DC | PRN

## 2017-10-25 NOTE — Progress Notes (Signed)
Name: Dana Sparks   MRN: [id number]    DOB: May 16, 1951   Date:10/25/2017       Progress Note  Subjective  Chief Complaint  Chief Complaint  Patient presents with  . URI    cough, congested, wheezing, sweating for 4 days    HPI  Pt presents with concern for URI symptoms ongoing for 4 days - she reports reproducible chest tenderness, productive cough, wheezing, sweats, chills, shortness of breath with coughing, significant nasal congestion. Denies fevers, NVD, body aches.  Tried Mucinex, flonase, saline nasal spray, tessalon, and tylenol without relief of symptoms.  Pt is diabetic - 120 this morning fasting.  Last A1C was 7.3%.   Patient Active Problem List   Diagnosis Date Noted  . Type II or unspecified type diabetes mellitus with neurological manifestations, not stated as uncontrolled(250.60) 09/03/2017  . Nasal polyp 09/03/2017  . Lipoma of back 09/03/2017  . Claudication ([place]) 06/04/2017  . LVH (left ventricular hypertrophy) 02/27/2017  . Decreased cardiac ejection fraction 02/27/2017  . Left hand weakness 10/12/2016  . Elevated antinuclear antibody (ANA) level 04/27/2016  . Angina pectoris ([place]) 11/01/2015  . Well controlled type 2 diabetes mellitus with gastroparesis ([place]) 06/22/2015  . Low TSH level 06/22/2015  . Iron deficiency anemia 04/07/2015  . Intestinal metaplasia of gastric mucosa 03/11/2015  . CAD (coronary artery disease), native coronary artery 02/24/2015  . History of coronary artery stent placement 02/24/2015  . Chronic kidney disease, stage 3, mod decreased GFR (HCC) 02/24/2015  . Menopause 02/24/2015  . History of Helicobacter pylori infection 02/24/2015  . Mild mitral insufficiency 02/24/2015  . Mild tricuspid insufficiency 02/24/2015  . 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 Controlled gout 02/24/2015  . Depression, major, recurrent, mild ([place]) 02/24/2015  . Morbid obesity due to excess calories ([place])  02/24/2015  . Mild pulmonary hypertension ([name]) 02/24/2015  . Gastroesophageal reflux disease without esophagitis 02/24/2015  . Perennial allergic rhinitis 02/24/2015  . HLD (hyperlipidemia) 02/20/2015  . Hypertension, benign 02/20/2015  . Apnea, sleep 02/20/2015  . Controlled diabetes mellitus with stage 3 chronic kidney disease, without long-term current use of insulin ([place]) 02/20/2015    Social History   Tobacco Use  . Smoking status: Former Smoker    Packs/day: 1.00    Years: 40.00    Pack years: 40.00    Types: Cigarettes    Last attempt to quit: 12/30/2012    Years since quitting: 4.8  . Smokeless tobacco: Never Used  Substance Use Topics  . Alcohol use: No    Alcohol/week: 0.0 oz     Current Outpatient Medications:  .  ACCU-CHEK FASTCLIX LANCETS MISC, , Disp: , Rfl:  .  ACCU-CHEK SMARTVIEW test strip, Check fsbs two times daily  DMII, Disp: 100 each, Rfl: 6 .  Alcohol Swabs (B-D SINGLE USE SWABS REGULAR) PADS, 1 each by Does not apply route 4 (four) times daily., Disp: 200 each, Rfl: 2 .  allopurinol (ZYLOPRIM) 100 MG tablet, Take 1 tablet (100 mg total) by mouth daily., Disp: 90 tablet, Rfl: 3 .  aspirin EC 81 MG tablet, Take 81 mg by mouth every morning. , Disp: , Rfl:  .  blood glucose meter kit and supplies KIT, Dispense based on patient and insurance preference. Use up to four times daily as directed. (FOR ICD-9 250.00, 250.01)., Disp: 1 each, Rfl: 0 .  clindamycin (CLEOCIN) 150 MG capsule, Take 150 mg by mouth 4 (four) times daily. Take 4  tablets at once one hour before dental appointment., Disp: , Rfl:  .  Coenzyme Q10 100 MG capsule, TAKE 1 CAPSULE EVERY DAY, Disp: 90 capsule, Rfl: 1 .  Ferrous Sulfate (IRON) 325 (65 Fe) MG TABS, Take 325 mg by mouth 2 (two) times daily. Nature made brand-  Pt takes one tablet twice a day , Disp: , Rfl:  .  fluticasone (FLONASE) 50 MCG/ACT nasal spray, Place 2 sprays into both nostrils daily., Disp: 48 g, Rfl: 2 .  glipiZIDE  (GLUCOTROL XL) 10 MG 24 hr tablet, TAKE 1 TABLET EVERY DAY WITH BREAKFAST, Disp: 90 tablet, Rfl: 1 .  glucose blood test strip, Use 2 (two) times daily., Disp: , Rfl:  .  insulin NPH Human (HUMULIN N,NOVOLIN N) 100 UNIT/ML injection, Inject 15-20 Units into the skin 2 (two) times daily before a meal. 15 units in the morning and 20 units at bedtime, Disp: , Rfl:  .  Insulin Syringe-Needle U-100 (INSULIN SYRINGE .5CC/31GX5/16") 31G X 5/16" 0.5 ML MISC, Use as directed. Use two times daily., Disp: , Rfl:  .  ipratropium (ATROVENT) 0.03 % nasal spray, Place 2 sprays into the nose 3 (three) times daily., Disp: , Rfl:  .  isosorbide mononitrate (IMDUR) 60 MG 24 hr tablet, Take 1 tablet (60 mg total) by mouth daily., Disp: 90 tablet, Rfl: 1 .  LANCETS MICRO THIN 33G MISC, Use 1 each 2 (two) times daily. accu chek. Diagnosis: E11.22, Disp: , Rfl:  .  lisinopril (PRINIVIL,ZESTRIL) 40 MG tablet, Take 1 tablet (40 mg total) daily by mouth., Disp: 90 tablet, Rfl: 1 .  loratadine (CLARITIN) 10 MG tablet, Take by mouth., Disp: , Rfl:  .  meloxicam (MOBIC) 7.5 MG tablet, Take by mouth., Disp: , Rfl:  .  metFORMIN (GLUCOPHAGE-XR) 500 MG 24 hr tablet, Take 2,000 tablets by mouth daily with supper., Disp: , Rfl:  .  metoprolol succinate (TOPROL-XL) 50 MG 24 hr tablet, Take 1 tablet (50 mg total) by mouth daily., Disp: 90 tablet, Rfl: 3 .  montelukast (SINGULAIR) 10 MG tablet, Take 1 tablet by mouth every evening., Disp: , Rfl:  .  pantoprazole (PROTONIX) 40 MG tablet, Take 1 tablet (40 mg total) by mouth every morning., Disp: 90 tablet, Rfl: 1 .  rosuvastatin (CRESTOR) 20 MG tablet, Take 1 tablet (20 mg total) by mouth daily., Disp: 90 tablet, Rfl: 1 .  traMADol (ULTRAM) 50 MG tablet, Take 1 tablet (50 mg total) by mouth every 8 (eight) hours as needed., Disp: 30 tablet, Rfl: 0 .  venlafaxine XR (EFFEXOR-XR) 150 MG 24 hr capsule, Take 1 capsule (150 mg total) by mouth daily with breakfast. First week take one with  Celexa after that take 2 daily and stop celexa (Patient taking differently: Take 150 mg by mouth 2 (two) times daily. ), Disp: 90 capsule, Rfl: 1  Allergies  Allergen Reactions  . Codeine Other (See Comments)    Unknown reaction  . Contrast Media [Iodinated Diagnostic Agents] Itching  . Sulfa Antibiotics Itching    ROS  Ten systems reviewed and is negative except as mentioned in HPI  Objective  Vitals:   10/25/17 1427  BP: 128/72  Pulse: 89  Resp: 18  Temp: 98.2 F (36.8 C)  TempSrc: Oral  SpO2: 95%  Weight: 231 lb 8 oz (105 kg)  Height: '5\' 6"'  (1.676 m)   Body mass index is 37.37 kg/m.  Nursing Note and Vital Signs reviewed.  Physical Exam  Constitutional: Patient appears well-developed and  well-nourished. Obese. No distress.  HEENT: head atraumatic, normocephalic, pupils equal and reactive to light, EOM's intact, TM's without erythema or bulging, no maxillary or frontal sinus tenderness , neck supple without lymphadenopathy, oropharynx erythematous and moist without exudate Cardiovascular: Normal rate, regular rhythm, S1/S2 present.  No murmur or rub heard. No BLE edema.  Central left sided chest tenderness on palpation. Pulmonary/Chest: Effort normal and breath sounds slightly diminished in BUL. No respiratory distress or retractions. Psychiatric: Patient has a normal mood and affect. behavior is normal. Judgment and thought content normal.  No results found for this or any previous visit (from the past 72 hour(s)).  Assessment & Plan  1. Bronchitis - promethazine-dextromethorphan (PROMETHAZINE-DM) 6.25-15 MG/5ML syrup; Take 5 mLs by mouth 3 (three) times daily as needed for cough.  Dispense: 120 mL; Refill: 0 - doxycycline (VIBRA-TABS) 100 MG tablet; Take 1 tablet (100 mg total) by mouth 2 (two) times daily for 7 days.  Dispense: 14 tablet; Refill: 0 - budesonide-formoterol (SYMBICORT) 80-4.5 MCG/ACT inhaler; Inhale 2 puffs into the lungs 2 (two) times daily.   Dispense: 1 Inhaler; Refill: 3 - May continue flonase daily, Tylenol PRN for pain, saline nasal spray PRN.  2. Controlled type 2 diabetes mellitus with stage 3 chronic kidney disease, without long-term current use of insulin (HCC) - We will avoid systemic corticosteroids at this time due to uncontrolled DM.  We will try Symbicort as a local steroid to the lungs only.  Discussed risk for complex infections due to DM and sick day management in detail.  -Red flags and when to present for emergency care or RTC including fever >101.7F, chest pain, shortness of breath, new/worsening/un-resolving symptoms, reviewed with patient at time of visit. Follow up and care instructions discussed and provided in AVS.

## 2017-10-25 NOTE — Patient Instructions (Addendum)
Diabetes Mellitus and Sick Day Management Blood sugar (glucose) can be difficult to control when you are sick. Common illnesses that can cause problems for people with diabetes (diabetes mellitus) include colds, fever, flu (influenza), nausea, vomiting, and diarrhea. These illnesses can cause stress and loss of body fluids (dehydration), and those issues can cause blood glucose levels to increase. Because of this, it is very important to take your insulin and diabetes medicines and eat some form of carbohydrate when you are sick. You should make a plan for days when you are sick (sick day plan) as part of your diabetes management plan. You and your health care provider should make this plan in advance. The following guidelines are intended to help you manage an illness that lasts for about 24 hours or less. Your health care provider may also give you more specific instructions. What do I need to do to manage my blood glucose?  Check your blood glucose every 2-4 hours, or as often as told by your health care provider.  Know your sick day treatment goals. Your target blood glucose levels may be different when you are sick.  If you use insulin, take your usual dose. ? If your blood glucose continues to be too high, you may need to take an additional insulin dose as told by your health care provider.  If you use oral diabetes medicine, you may need to stop taking it if you are not able to eat or drink normally. Ask your health care provider about whether you need to stop taking these medicines while you are sick.  If you use injectable hormone medicines other than insulin to control your diabetes, ask your health care provider about whether you need to stop taking these medicines while you are sick. What else can I do to manage my diabetes when I am sick? Check your ketones  If you have type 1 diabetes, check your urine ketones every 4 hours.  If you have type 2 diabetes, check your urine ketones as  often as told by your health care provider. Drink fluids  Drink enough fluid to keep your urine clear or pale yellow. This is especially important if you have a fever, vomiting, or diarrhea. Those symptoms can lead to dehydration.  Follow any instructions from your health care provider about beverages to avoid. ? Do not drink alcohol, caffeine, or drinks that contain a lot of sugar. Take medicines as directed  Take-over-the-counter and prescription medicines only as told by your health care provider.  Check medicine labels for added sugars. Some medicines may contain sugar or types of sugars that can raise your blood glucose level. What foods can I eat when I am sick? You need to eat some form of carbohydrates when you are sick. You should eat 45-50 grams (45-50 g) of carbohydrates every 3-4 hours until you feel better. All of the food choices below contain about 15 g of carbohydrates. Plan ahead and keep some of these foods around so you have them if you get sick.  4-6 oz (120-177 mL) carbonated beverage that contains sugar, such as regular (not diet) soda. You may be able to drink carbonated beverages more easily if you open the beverage and let it sit at room temperature for a few minutes before drinking.   of a twin frozen ice pop.  4 oz (120 g) regular gelatin.  4 oz (120 mL) fruit juice.  4 oz (120 g) ice cream or frozen yogurt.  2 oz (60  g) sherbet.  8 oz (240 mL) clear broth or soup.  4 oz (120 g) regular custard.  4 oz (120 g) regular pudding.  8 oz (240 g) plain yogurt.  1 slice bread or toast.  6 saltine crackers.  5 vanilla wafers.  Questions to ask your health care provider Consider asking the following questions so you know what to do on days when you are sick:  Should I adjust my diabetes medicines?  How often do I need to check my blood glucose?  What supplies do I need to manage my diabetes at home when I am sick?  What number can I call if I have  questions?  What foods and drinks should I avoid?  Contact a health care provider if:  You develop symptoms of diabetic ketoacidosis, such as: ? Fatigue. ? Weight loss. ? Excessive thirst. ? Light-headedness. ? Fruity or sweet-smelling breath. ? Excessive urination. ? Vision changes. ? Confusion or irritability. ? Nausea. ? Vomiting. ? Rapid breathing. ? Pain in the abdomen. ? Feeling flushed.  You are unable to drink fluids without vomiting.  You have any of the following for more than 6 hours: ? Nausea. ? Vomiting. ? Diarrhea.  Your blood glucose is at or above 240 mg/dL (13.3 mmol/L), even after you take an additional insulin dose.  You have a change in how you think, feel, or act (mental status).  You develop another serious illness.  You have been sick or have had a fever for 2 days or longer and you are not getting better. Get help right away if:  Your blood glucose is lower than 54 mg/dL (3.0 mmol/L).  You have difficulty breathing.  You have moderate or high ketone levels in your urine.  You used emergency glucagon to treat low blood glucose. Summary  Blood sugar (glucose) can be difficult to control when you are sick. Common illnesses that can cause problems for people with diabetes (diabetes mellitus) include colds, fever, flu (influenza), nausea, vomiting, and diarrhea.  Illnesses can cause stress and loss of body fluids (dehydration), and those issues can cause blood glucose levels to increase.  Make a plan for days when you are sick (sick day plan) as part of your diabetes management plan. You and your health care provider should make this plan in advance.  It is very important to take your insulin and diabetes medicines and to eat some form of carbohydrate when you are sick.  Contact your health care provider if have problems managing your blood glucose levels when you are sick, or if you have been sick or had a fever for 2 days or longer and are  not getting better. This information is not intended to replace advice given to you by your health care provider. Make sure you discuss any questions you have with your health care provider. Document Released: 08/24/2003 Document Revised: 05/19/2016 Document Reviewed: 05/19/2016 Elsevier Interactive Patient Education  2018 [employer].  Acute Bronchitis, Adult Acute bronchitis is when air tubes (bronchi) in the lungs suddenly get swollen. The condition can make it hard to breathe. It can also cause these symptoms:  A cough.  Coughing up clear, yellow, or green mucus.  Wheezing.  Chest congestion.  Shortness of breath.  A fever.  Body aches.  Chills.  A sore throat.  Follow these instructions at home: Medicines  Take over-the-counter and prescription medicines only as told by your doctor.  If you were prescribed an antibiotic medicine, take it as told  by your doctor. Do not stop taking the antibiotic even if you start to feel better. General instructions  Rest.  Drink enough fluids to keep your pee (urine) clear or pale yellow.  Avoid smoking and secondhand smoke. If you smoke and you need help quitting, ask your doctor. Quitting will help your lungs heal faster.  Use an inhaler, cool mist vaporizer, or humidifier as told by your doctor.  Keep all follow-up visits as told by your doctor. This is important. How is this prevented? To lower your risk of getting this condition again:  Wash your hands often with soap and water. If you cannot use soap and water, use hand sanitizer.  Avoid contact with people who have cold symptoms.  Try not to touch your hands to your mouth, nose, or eyes.  Make sure to get the flu shot every year.  Contact a doctor if:  Your symptoms do not get better in 2 weeks. Get help right away if:  You cough up blood.  You have chest pain.  You have very bad shortness of breath.  You become dehydrated.  You faint (pass out) or keep  feeling like you are going to pass out.  You keep throwing up (vomiting).  You have a very bad headache.  Your fever or chills gets worse. This information is not intended to replace advice given to you by your health care provider. Make sure you discuss any questions you have with your health care provider. Document Released: 02/07/2008 Document Revised: 03/29/2016 Document Reviewed: 02/09/2016 Elsevier Interactive Patient Education  [employer].

## 2017-11-01 ENCOUNTER — Ambulatory Visit (INDEPENDENT_AMBULATORY_CARE_PROVIDER_SITE_OTHER): Payer: Medicare HMO | Admitting: Family Medicine

## 2017-11-01 ENCOUNTER — Encounter: Payer: Self-pay | Admitting: Family Medicine

## 2017-11-01 VITALS — BP 124/82 | HR 93 | Temp 98.1°F | Resp 16 | Ht 66.0 in | Wt 233.7 lb

## 2017-11-01 DIAGNOSIS — N183 Chronic kidney disease, stage 3 (moderate): Secondary | ICD-10-CM | POA: Diagnosis not present

## 2017-11-01 DIAGNOSIS — J40 Bronchitis, not specified as acute or chronic: Secondary | ICD-10-CM

## 2017-11-01 DIAGNOSIS — R0789 Other chest pain: Secondary | ICD-10-CM | POA: Diagnosis not present

## 2017-11-01 DIAGNOSIS — E1122 Type 2 diabetes mellitus with diabetic chronic kidney disease: Secondary | ICD-10-CM | POA: Diagnosis not present

## 2017-11-01 MED ORDER — PROMETHAZINE-DM 6.25-15 MG/5ML PO SYRP: 5.0000 mL | ORAL_SOLUTION | Freq: Three times a day (TID) | ORAL | 0 refills | Status: DC | PRN

## 2017-11-01 MED ORDER — GUAIFENESIN ER 600 MG PO TB12: 600.0000 mg | ORAL_TABLET | Freq: Two times a day (BID) | ORAL | 0 refills | Status: DC | PRN

## 2017-11-01 MED ORDER — ALBUTEROL SULFATE (2.5 MG/3ML) 0.083% IN NEBU: 2.5000 mg | INHALATION_SOLUTION | Freq: Four times a day (QID) | RESPIRATORY_TRACT | 0 refills | Status: DC | PRN

## 2017-11-01 NOTE — Progress Notes (Signed)
Name: Dana Sparks   MRN: [id number]    DOB: 09-10-1950   Date:11/01/2017       Progress Note  Subjective  Chief Complaint  Chief Complaint  Patient presents with  . Follow-up  . Wheezing  . Cough    HPI  Pt presents to follow up on bronchitis - she has been taking doxycyline and finished this morning, has also been using albuterol inhaler PRN with good relief of symptoms. She has been taking promethazine DM for cough and this has been helping as well.   She reports ongoing congested cough and some intermittent wheezing, but overall feels she is improving.  - She does note RIGHT lower ribcage pain - has been taking tylenol which seems to help some.  Discussed proper dosing and risk of taking too much acetaminophen - pt verbalizes understanding. - She has Diabetes, and notes this has been stable throughout the course of her illness.  Patient Active Problem List   Diagnosis Date Noted  . Type II or unspecified type diabetes mellitus with neurological manifestations, not stated as uncontrolled(250.60) 09/03/2017  . Nasal polyp 09/03/2017  . Lipoma of back 09/03/2017  . Claudication ([place]) 06/04/2017  . LVH (left ventricular hypertrophy) 02/27/2017  . Decreased cardiac ejection fraction 02/27/2017  . Left hand weakness 10/12/2016  . Elevated antinuclear antibody (ANA) level 04/27/2016  . Angina pectoris ([place]) 11/01/2015  . Well controlled type 2 diabetes mellitus with gastroparesis ([place]) 06/22/2015  . Low TSH level 06/22/2015  . Iron deficiency anemia 04/07/2015  . Intestinal metaplasia of gastric mucosa 03/11/2015  . CAD (coronary artery disease), native coronary artery 02/24/2015  . History of coronary artery stent placement 02/24/2015  . Chronic kidney disease, stage 3, mod decreased GFR ([facility]) 02/24/2015  . Menopause 02/24/2015  . History of Helicobacter pylori infection 02/24/2015  . Mild mitral insufficiency 02/24/2015  . Mild tricuspid insufficiency 02/24/2015  . 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 Controlled gout 02/24/2015  . Depression, major, recurrent, mild ([place]) 02/24/2015  . Morbid obesity due to excess calories ([place]) 02/24/2015  . Mild pulmonary hypertension ([place]) 02/24/2015  . Gastroesophageal reflux disease without esophagitis 02/24/2015  . Perennial allergic rhinitis 02/24/2015  . HLD (hyperlipidemia) 02/20/2015  . Hypertension, benign 02/20/2015  . Apnea, sleep 02/20/2015  . Controlled diabetes mellitus with stage 3 chronic kidney disease, without long-term current use of insulin ([place]) 02/20/2015    Social History   Tobacco Use  . Smoking status: Former Smoker    Packs/day: 1.00    Years: 40.00    Pack years: 40.00    Types: Cigarettes    Last attempt to quit: 12/30/2012    Years since quitting: 4.8  . Smokeless tobacco: Never Used  Substance Use Topics  . Alcohol use: No    Alcohol/week: 0.0 oz    Current Outpatient Medications:  .  ACCU-CHEK FASTCLIX LANCETS MISC, , Disp: , Rfl:  .  ACCU-CHEK SMARTVIEW test strip, Check fsbs two times daily  DMII, Disp: 100 each, Rfl: 6 .  Alcohol Swabs (B-D SINGLE USE SWABS REGULAR) PADS, 1 each by Does not apply route 4 (four) times daily., Disp: 200 each, Rfl: 2 .  allopurinol (ZYLOPRIM) 100 MG tablet, Take 1 tablet (100 mg total) by mouth daily., Disp: 90 tablet, Rfl: 3 .  aspirin EC 81 MG tablet, Take 81 mg by mouth every morning. , Disp: , Rfl:  .  blood glucose meter kit and supplies KIT,  Dispense based on patient and insurance preference. Use up to four times daily as directed. (FOR ICD-9 250.00, 250.01)., Disp: 1 each, Rfl: 0 .  budesonide-formoterol (SYMBICORT) 80-4.5 MCG/ACT inhaler, Inhale 2 puffs into the lungs 2 (two) times daily., Disp: 1 Inhaler, Rfl: 3 .  clindamycin (CLEOCIN) 150 MG capsule, Take 150 mg by mouth 4 (four) times daily. Take 4 tablets at once one hour before dental appointment., Disp: , Rfl:  .  Coenzyme Q10 100 MG  capsule, TAKE 1 CAPSULE EVERY DAY, Disp: 90 capsule, Rfl: 1 .  Ferrous Sulfate (IRON) 325 (65 Fe) MG TABS, Take 325 mg by mouth 2 (two) times daily. Nature made brand-  Pt takes one tablet twice a day , Disp: , Rfl:  .  fluticasone (FLONASE) 50 MCG/ACT nasal spray, Place 2 sprays into both nostrils daily., Disp: 48 g, Rfl: 2 .  glipiZIDE (GLUCOTROL XL) 10 MG 24 hr tablet, TAKE 1 TABLET EVERY DAY WITH BREAKFAST, Disp: 90 tablet, Rfl: 1 .  glucose blood test strip, Use 2 (two) times daily., Disp: , Rfl:  .  insulin NPH Human (HUMULIN N,NOVOLIN N) 100 UNIT/ML injection, Inject 15-20 Units into the skin 2 (two) times daily before a meal. 15 units in the morning and 20 units at bedtime, Disp: , Rfl:  .  Insulin Syringe-Needle U-100 (INSULIN SYRINGE .5CC/31GX5/16") 31G X 5/16" 0.5 ML MISC, Use as directed. Use two times daily., Disp: , Rfl:  .  ipratropium (ATROVENT) 0.03 % nasal spray, Place 2 sprays into the nose 3 (three) times daily., Disp: , Rfl:  .  isosorbide mononitrate (IMDUR) 60 MG 24 hr tablet, Take 1 tablet (60 mg total) by mouth daily., Disp: 90 tablet, Rfl: 1 .  LANCETS MICRO THIN 33G MISC, Use 1 each 2 (two) times daily. accu chek. Diagnosis: E11.22, Disp: , Rfl:  .  lisinopril (PRINIVIL,ZESTRIL) 40 MG tablet, Take 1 tablet (40 mg total) daily by mouth., Disp: 90 tablet, Rfl: 1 .  loratadine (CLARITIN) 10 MG tablet, Take by mouth., Disp: , Rfl:  .  meloxicam (MOBIC) 7.5 MG tablet, Take by mouth., Disp: , Rfl:  .  metFORMIN (GLUCOPHAGE-XR) 500 MG 24 hr tablet, Take 2,000 tablets by mouth daily with supper., Disp: , Rfl:  .  metoprolol succinate (TOPROL-XL) 50 MG 24 hr tablet, Take 1 tablet (50 mg total) by mouth daily., Disp: 90 tablet, Rfl: 3 .  montelukast (SINGULAIR) 10 MG tablet, Take 1 tablet by mouth every evening., Disp: , Rfl:  .  pantoprazole (PROTONIX) 40 MG tablet, Take 1 tablet (40 mg total) by mouth every morning., Disp: 90 tablet, Rfl: 1 .  promethazine-dextromethorphan  (PROMETHAZINE-DM) 6.25-15 MG/5ML syrup, Take 5 mLs by mouth 3 (three) times daily as needed for cough., Disp: 120 mL, Rfl: 0 .  rosuvastatin (CRESTOR) 20 MG tablet, Take 1 tablet (20 mg total) by mouth daily., Disp: 90 tablet, Rfl: 1 .  traMADol (ULTRAM) 50 MG tablet, Take 1 tablet (50 mg total) by mouth every 8 (eight) hours as needed., Disp: 30 tablet, Rfl: 0 .  venlafaxine XR (EFFEXOR-XR) 150 MG 24 hr capsule, Take 1 capsule (150 mg total) by mouth daily with breakfast. First week take one with Celexa after that take 2 daily and stop celexa (Patient taking differently: Take 150 mg by mouth 2 (two) times daily. ), Disp: 90 capsule, Rfl: 1 .  doxycycline (VIBRA-TABS) 100 MG tablet, Take 1 tablet (100 mg total) by mouth 2 (two) times daily for 7 days. (Patient  not taking: Reported on 11/01/2017), Disp: 14 tablet, Rfl: 0  Allergies  Allergen Reactions  . Codeine Other (See Comments)    Unknown reaction  . Contrast Media [Iodinated Diagnostic Agents] Itching  . Sulfa Antibiotics Itching    ROS  Constitutional: Negative for fever or weight change.  Respiratory: Positive for cough and shortness of breath.   Cardiovascular: Negative for palpitations.  Gastrointestinal: Negative for abdominal pain, no bowel changes.  Musculoskeletal: Negative for gait problem or joint swelling.  Skin: Negative for rash.  Neurological: Negative for dizziness or headache.  No other specific complaints in a complete review of systems (except as listed in HPI above).  Objective  Vitals:   11/01/17 0759  BP: 124/82  Pulse: 93  Resp: 16  Temp: 98.1 F (36.7 C)  TempSrc: Oral  SpO2: 93%  Weight: 233 lb 11.2 oz (106 kg)  Height: '5\' 6"'  (1.676 m)   Body mass index is 37.72 kg/m.  Nursing Note and Vital Signs reviewed.  Physical Exam  Constitutional: Patient appears well-developed and well-nourished. Obese. No distress.  HEENT: head atraumatic, normocephalic, pupils equal and reactive to light, EOM's  intact, TM's without erythema or bulging, no maxillary or frontal sinus tenderness , neck supple without lymphadenopathy, oropharynx pink and moist without exudate Cardiovascular: Normal rate, regular rhythm, S1/S2 present. Reproducible RIGHT lower ribcage tenderness. No murmur or rub heard. No BLE edema.  Pulmonary/Chest: Effort normal and breath sounds no longer diminished, but inspiratory/expiratory wheeze is present in BUL. No respiratory distress or retractions.  Congested cough present throughout examination. Abdominal: Soft and non-tender, bowel sounds present x4 quadrants. Psychiatric: Patient has a normal mood and affect. behavior is normal. Judgment and thought content normal.  No results found for this or any previous visit (from the past 72 hour(s)).  Assessment & Plan  1. Bronchitis - promethazine-dextromethorphan (PROMETHAZINE-DM) 6.25-15 MG/5ML syrup; Take 5 mLs by mouth 3 (three) times daily as needed for cough.  Dispense: 120 mL; Refill: 0 - albuterol (PROVENTIL) (2.5 MG/3ML) 0.083% nebulizer solution; Take 3 mLs (2.5 mg total) by nebulization every 6 (six) hours as needed for wheezing or shortness of breath.  Dispense: 75 mL; Refill: 0 - guaiFENesin (MUCINEX) 600 MG 12 hr tablet; Take 1 tablet (600 mg total) by mouth 2 (two) times daily as needed for cough or to loosen phlegm.  Dispense: 20 tablet; Refill: 0  2. Right-sided chest wall pain - Splinting discussed, taking deep breaths for passive spirometry every hour encouraged; tylenol PRN for pain  3. Controlled type 2 diabetes mellitus with stage 3 chronic kidney disease, without long-term current use of insulin (HCC) - Continue to monitor at home; sick day care discussed.   -Red flags and when to present for emergency care or RTC including fever >101.46F, chest pain, shortness of breath unrelieved by albuterol, new/worsening/un-resolving symptoms, reviewed with patient at time of visit. Follow up and care instructions  discussed and provided in AVS.

## 2017-11-01 NOTE — Patient Instructions (Addendum)
-   Please take mucinex (Guafenesin) 600mg  Twice Daily  Chest Wall Pain Chest wall pain is pain in or around the bones and muscles of your chest. Sometimes, an injury causes this pain. Sometimes, the cause may not be known. This pain may take several weeks or longer to get better. Follow these instructions at home: Pay attention to any changes in your symptoms. Take these actions to help with your pain:  Rest as told by your doctor.  Avoid activities that cause pain. Try not to use your chest, belly (abdominal), or side muscles to lift heavy things.  If directed, apply ice to the painful area: ? Put ice in a plastic bag. ? Place a towel between your skin and the bag. ? Leave the ice on for 20 minutes, 2-3 times per day.  Take over-the-counter and prescription medicines only as told by your doctor.  Do not use tobacco products, including cigarettes, chewing tobacco, and e-cigarettes. If you need help quitting, ask your doctor.  Keep all follow-up visits as told by your doctor. This is important.  Contact a doctor if:  You have a fever.  Your chest pain gets worse.  You have new symptoms. Get help right away if:  You feel sick to your stomach (nauseous) or you throw up (vomit).  You feel sweaty or light-headed.  You have a cough with phlegm (sputum) or you cough up blood.  You are short of breath. This information is not intended to replace advice given to you by your health care provider. Make sure you discuss any questions you have with your health care provider. Document Released: 02/07/2008 Document Revised: 01/27/2016 Document Reviewed: 11/16/2014 Elsevier Interactive Patient Education  [employer].

## 2017-11-14 DIAGNOSIS — E1142 Type 2 diabetes mellitus with diabetic polyneuropathy: Secondary | ICD-10-CM | POA: Diagnosis not present

## 2017-11-14 DIAGNOSIS — L851 Acquired keratosis [keratoderma] palmaris et plantaris: Secondary | ICD-10-CM | POA: Diagnosis not present

## 2017-11-14 DIAGNOSIS — B351 Tinea unguium: Secondary | ICD-10-CM | POA: Diagnosis not present

## 2017-12-06 ENCOUNTER — Encounter: Payer: Self-pay | Admitting: Family Medicine

## 2017-12-06 ENCOUNTER — Ambulatory Visit (INDEPENDENT_AMBULATORY_CARE_PROVIDER_SITE_OTHER): Payer: Medicare HMO | Admitting: Family Medicine

## 2017-12-06 VITALS — BP 140/76 | HR 91 | Temp 97.5°F | Resp 16 | Ht 66.0 in | Wt 229.7 lb

## 2017-12-06 DIAGNOSIS — R05 Cough: Secondary | ICD-10-CM | POA: Diagnosis not present

## 2017-12-06 DIAGNOSIS — E1122 Type 2 diabetes mellitus with diabetic chronic kidney disease: Secondary | ICD-10-CM | POA: Diagnosis not present

## 2017-12-06 DIAGNOSIS — J40 Bronchitis, not specified as acute or chronic: Secondary | ICD-10-CM | POA: Diagnosis not present

## 2017-12-06 DIAGNOSIS — R059 Cough, unspecified: Secondary | ICD-10-CM

## 2017-12-06 DIAGNOSIS — H1089 Other conjunctivitis: Secondary | ICD-10-CM | POA: Diagnosis not present

## 2017-12-06 DIAGNOSIS — N183 Chronic kidney disease, stage 3 (moderate): Secondary | ICD-10-CM

## 2017-12-06 DIAGNOSIS — J0100 Acute maxillary sinusitis, unspecified: Secondary | ICD-10-CM | POA: Diagnosis not present

## 2017-12-06 MED ORDER — GUAIFENESIN ER 600 MG PO TB12: 600.0000 mg | ORAL_TABLET | Freq: Two times a day (BID) | ORAL | 0 refills | Status: DC | PRN

## 2017-12-06 MED ORDER — CIPROFLOXACIN HCL 0.3 % OP SOLN: 1.0000 [drp] | OPHTHALMIC | 0 refills | Status: DC

## 2017-12-06 NOTE — Patient Instructions (Signed)

## 2017-12-06 NOTE — Progress Notes (Signed)
Name: Dana Sparks   MRN: [id number]    DOB: February 24, 1951   Date:12/06/2017       Progress Note  Subjective  Chief Complaint  Chief Complaint  Patient presents with  . Sinusitis  . Cough  . Sore Throat  . Conjunctivitis    HPI  URI: symptoms started 4 days ago, initially with scratchy throat, followed by dry cough, occasionally with sputum production, followed by left eye drainage and waking up with it matted shut, she tried some antibiotic ointment that helps with left eye, but now is on the right eye. No blurred vision. She also has noticed nasal congestion, post-nasal drainage, facial pressure, headache ( dull) , taking Tylenol otc, also using nasal sprays and still takes singulair. She has also noticed some itchy eyes. She states symptoms are worse at night. No fever or chills, normal appetite, glucose has been controlled, this morning it was 137 . She is taking diabetes medication as prescribed.   Patient Active Problem List   Diagnosis Date Noted  . Type II or unspecified type diabetes mellitus with neurological manifestations, not stated as uncontrolled(250.60) 09/03/2017  . Nasal polyp 09/03/2017  . Lipoma of back 09/03/2017  . Claudication ([place]) 06/04/2017  . LVH (left ventricular hypertrophy) 02/27/2017  . Decreased cardiac ejection fraction 02/27/2017  . Left hand weakness 10/12/2016  . Elevated antinuclear antibody (ANA) level 04/27/2016  . Angina pectoris ([place]) 11/01/2015  . Well controlled type 2 diabetes mellitus with gastroparesis ([place]) 06/22/2015  . Low TSH level 06/22/2015  . Iron deficiency anemia 04/07/2015  . Intestinal metaplasia of gastric mucosa 03/11/2015  . CAD (coronary artery disease), native coronary artery 02/24/2015  . History of coronary artery stent placement 02/24/2015  . Chronic kidney disease, stage 3, mod decreased GFR (HCC) 02/24/2015  . Menopause 02/24/2015  . History of Helicobacter pylori infection 02/24/2015  . Mild mitral insufficiency  02/24/2015  . Mild tricuspid insufficiency 02/24/2015  . 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 Controlled gout 02/24/2015  . Depression, major, recurrent, mild ([place]) 02/24/2015  . Morbid obesity due to excess calories ([place]) 02/24/2015  . Mild pulmonary hypertension ([place]) 02/24/2015  . Gastroesophageal reflux disease without esophagitis 02/24/2015  . Perennial allergic rhinitis 02/24/2015  . HLD (hyperlipidemia) 02/20/2015  . Hypertension, benign 02/20/2015  . Apnea, sleep 02/20/2015  . Controlled diabetes mellitus with stage 3 chronic kidney disease, without long-term current use of insulin ([place]) 02/20/2015    Social History   Tobacco Use  . Smoking status: Former Smoker    Packs/day: 1.00    Years: 40.00    Pack years: 40.00    Types: Cigarettes    Last attempt to quit: 12/30/2012    Years since quitting: 4.9  . Smokeless tobacco: Never Used  Substance Use Topics  . Alcohol use: No    Alcohol/week: 0.0 oz     Current Outpatient Medications:  .  ACCU-CHEK FASTCLIX LANCETS MISC, , Disp: , Rfl:  .  ACCU-CHEK SMARTVIEW test strip, Check fsbs two times daily  DMII, Disp: 100 each, Rfl: 6 .  albuterol (PROVENTIL) (2.5 MG/3ML) 0.083% nebulizer solution, Take 3 mLs (2.5 mg total) by nebulization every 6 (six) hours as needed for wheezing or shortness of breath., Disp: 75 mL, Rfl: 0 .  Alcohol Swabs (B-D SINGLE USE SWABS REGULAR) PADS, 1 each by Does not apply route 4 (four) times daily., Disp: 200 each, Rfl: 2 .  allopurinol (ZYLOPRIM) 100 MG tablet, Take  1 tablet (100 mg total) by mouth daily., Disp: 90 tablet, Rfl: 3 .  aspirin EC 81 MG tablet, Take 81 mg by mouth every morning. , Disp: , Rfl:  .  blood glucose meter kit and supplies KIT, Dispense based on patient and insurance preference. Use up to four times daily as directed. (FOR ICD-9 250.00, 250.01)., Disp: 1 each, Rfl: 0 .  budesonide-formoterol (SYMBICORT) 80-4.5 MCG/ACT  inhaler, Inhale 2 puffs into the lungs 2 (two) times daily., Disp: 1 Inhaler, Rfl: 3 .  clindamycin (CLEOCIN) 150 MG capsule, Take 150 mg by mouth 4 (four) times daily. Take 4 tablets at once one hour before dental appointment., Disp: , Rfl:  .  Coenzyme Q10 100 MG capsule, TAKE 1 CAPSULE EVERY DAY, Disp: 90 capsule, Rfl: 1 .  Ferrous Sulfate (IRON) 325 (65 Fe) MG TABS, Take 325 mg by mouth 2 (two) times daily. Nature made brand-  Pt takes one tablet twice a day , Disp: , Rfl:  .  fluticasone (FLONASE) 50 MCG/ACT nasal spray, Place 2 sprays into both nostrils daily., Disp: 48 g, Rfl: 2 .  glipiZIDE (GLUCOTROL XL) 10 MG 24 hr tablet, TAKE 1 TABLET EVERY DAY WITH BREAKFAST, Disp: 90 tablet, Rfl: 1 .  glucose blood test strip, Use 2 (two) times daily., Disp: , Rfl:  .  guaiFENesin (MUCINEX) 600 MG 12 hr tablet, Take 1 tablet (600 mg total) by mouth 2 (two) times daily as needed for cough or to loosen phlegm., Disp: 20 tablet, Rfl: 0 .  insulin NPH Human (HUMULIN N,NOVOLIN N) 100 UNIT/ML injection, Inject 15-20 Units into the skin 2 (two) times daily before a meal. 15 units in the morning and 20 units at bedtime, Disp: , Rfl:  .  Insulin Syringe-Needle U-100 (INSULIN SYRINGE .5CC/31GX5/16") 31G X 5/16" 0.5 ML MISC, Use as directed. Use two times daily., Disp: , Rfl:  .  ipratropium (ATROVENT) 0.03 % nasal spray, Place 2 sprays into the nose 3 (three) times daily., Disp: , Rfl:  .  isosorbide mononitrate (IMDUR) 60 MG 24 hr tablet, Take 1 tablet (60 mg total) by mouth daily., Disp: 90 tablet, Rfl: 1 .  LANCETS MICRO THIN 33G MISC, Use 1 each 2 (two) times daily. accu chek. Diagnosis: E11.22, Disp: , Rfl:  .  lisinopril (PRINIVIL,ZESTRIL) 40 MG tablet, Take 1 tablet (40 mg total) daily by mouth., Disp: 90 tablet, Rfl: 1 .  loratadine (CLARITIN) 10 MG tablet, Take by mouth., Disp: , Rfl:  .  meloxicam (MOBIC) 7.5 MG tablet, Take by mouth., Disp: , Rfl:  .  metFORMIN (GLUCOPHAGE-XR) 500 MG 24 hr tablet, Take  2,000 tablets by mouth daily with supper., Disp: , Rfl:  .  metoprolol succinate (TOPROL-XL) 50 MG 24 hr tablet, Take 1 tablet (50 mg total) by mouth daily., Disp: 90 tablet, Rfl: 3 .  montelukast (SINGULAIR) 10 MG tablet, Take 1 tablet by mouth every evening., Disp: , Rfl:  .  pantoprazole (PROTONIX) 40 MG tablet, Take 1 tablet (40 mg total) by mouth every morning., Disp: 90 tablet, Rfl: 1 .  rosuvastatin (CRESTOR) 20 MG tablet, Take 1 tablet (20 mg total) by mouth daily., Disp: 90 tablet, Rfl: 1 .  traMADol (ULTRAM) 50 MG tablet, Take 1 tablet (50 mg total) by mouth every 8 (eight) hours as needed., Disp: 30 tablet, Rfl: 0 .  venlafaxine XR (EFFEXOR-XR) 150 MG 24 hr capsule, Take 1 capsule (150 mg total) by mouth daily with breakfast. First week take one with  Celexa after that take 2 daily and stop celexa (Patient taking differently: Take 150 mg by mouth 2 (two) times daily. ), Disp: 90 capsule, Rfl: 1 .  ciprofloxacin (CILOXAN) 0.3 % ophthalmic solution, Place 1 drop into the right eye every 2 (two) hours. Administer 1 drop, every 2 hours, while awake, for 2 days. Then 1 drop, every 4 hours, while awake, for the next 5 days., Disp: 5 mL, Rfl: 0  Allergies  Allergen Reactions  . Codeine Other (See Comments)    Unknown reaction  . Contrast Media [Iodinated Diagnostic Agents] Itching  . Sulfa Antibiotics Itching    ROS  Ten systems reviewed and is negative except as mentioned in HPI   Objective  Vitals:   12/06/17 0823  BP: 140/76  Pulse: 91  Resp: 16  Temp: (!) 97.5 F (36.4 C)  TempSrc: Oral  SpO2: 98%  Weight: 229 lb 11.2 oz (104.2 kg)  Height: _0  (1.676 m)    Body mass index is 37.07 kg/m.    Physical Exam  Constitutional: Patient appears well-developed and well-nourished. Obese  No distress.  HEENT: head atraumatic, normocephalic, pupils equal and reactive to light, ears normal TM bilaterally,  neck supple, throat within normal limits, right conjunctiva is  injected, no drainage , boggy turbinates, mild post-nasal drainage, tender during palpation of all sinuses.  Cardiovascular: Normal rate, regular rhythm and normal heart sounds.  No murmur heard. No BLE edema. Pulmonary/Chest: Effort normal and breath sounds normal. No respiratory distress. Abdominal: Soft.  There is no tenderness. Psychiatric: Patient has a normal mood and affect. behavior is normal. Judgment and thought content normal.  Recent Results (from the past 2160 hour(s))  HM DIABETES EYE EXAM     Status: None   Collection Time: 09/10/17 12:00 AM  Result Value Ref Range   HM Diabetic Eye Exam No Retinopathy No Retinopathy    Comment: [facility], Dr. Edison Pace  CBC with Differential/Platelet     Status: None   Collection Time: 10/03/17 10:07 AM  Result Value Ref Range   WBC 7.3 3.8 - 10.8 Thousand/uL   RBC 4.51 3.80 - 5.10 Million/uL   Hemoglobin 13.7 11.7 - 15.5 g/dL   HCT 40.1 35.0 - 45.0 %   MCV 88.9 80.0 - 100.0 fL   MCH 30.4 27.0 - 33.0 pg   MCHC 34.2 32.0 - 36.0 g/dL   RDW 12.3 11.0 - 15.0 %   Platelets 278 140 - 400 Thousand/uL   MPV 10.3 7.5 - 12.5 fL   Neutro Abs 3,117 1,500 - 7,800 cells/uL   Lymphs Abs 3,468 850 - 3,900 cells/uL   WBC mixed population 460 200 - 950 cells/uL   Eosinophils Absolute 226 15 - 500 cells/uL   Basophils Absolute 29 0 - 200 cells/uL   Neutrophils Relative % 42.7 %   Total Lymphocyte 47.5 %   Monocytes Relative 6.3 %   Eosinophils Relative 3.1 %   Basophils Relative 0.4 %  COMPLETE METABOLIC PANEL WITH GFR     Status: Abnormal   Collection Time: 10/03/17 10:07 AM  Result Value Ref Range   Glucose, Bld 125 (H) 65 - 99 mg/dL    Comment: .            Fasting reference interval . For someone without known diabetes, a glucose value between 100 and 125 mg/dL is consistent with prediabetes and should be confirmed with a follow-up test. .    BUN 13 7 - 25 mg/dL  Creat 0.75 0.50 - 0.99 mg/dL    Comment: For patients >49 years  of age, the reference limit for Creatinine is approximately 13% higher for people identified as African-American. .    GFR, Est Non African American 83 > OR = 60 mL/min/1.12m   GFR, Est African American 96 > OR = 60 mL/min/1.768m  BUN/Creatinine Ratio NOT APPLICABLE 6 - 22 (calc)   Sodium 143 135 - 146 mmol/L   Potassium 4.2 3.5 - 5.3 mmol/L   Chloride 107 98 - 110 mmol/L   CO2 25 20 - 32 mmol/L   Calcium 9.6 8.6 - 10.4 mg/dL   Total Protein 6.5 6.1 - 8.1 g/dL   Albumin 4.2 3.6 - 5.1 g/dL   Globulin 2.3 1.9 - 3.7 g/dL (calc)   AG Ratio 1.8 1.0 - 2.5 (calc)   Total Bilirubin 0.3 0.2 - 1.2 mg/dL   Alkaline phosphatase (APISO) 60 33 - 130 U/L   AST 13 10 - 35 U/L   ALT 11 6 - 29 U/L  Hemoglobin A1c     Status: Abnormal   Collection Time: 10/03/17 10:07 AM  Result Value Ref Range   Hgb A1c MFr Bld 7.3 (H) <5.7 % of total Hgb    Comment: For someone without known diabetes, a hemoglobin A1c value of 6.5% or greater indicates that they may have  diabetes and this should be confirmed with a follow-up  test. . For someone with known diabetes, a value <7% indicates  that their diabetes is well controlled and a value  greater than or equal to 7% indicates suboptimal  control. A1c targets should be individualized based on  duration of diabetes, age, comorbid conditions, and  other considerations. . Currently, no consensus exists regarding use of hemoglobin A1c for diagnosis of diabetes for children. .    Mean Plasma Glucose 163 (calc)   eAG (mmol/L) 9.0 (calc)  Lipid panel     Status: None   Collection Time: 10/03/17 10:07 AM  Result Value Ref Range   Cholesterol 128 <200 mg/dL   HDL 54 >50 mg/dL   Triglycerides 107 <150 mg/dL   LDL Cholesterol (Calc) 55 mg/dL (calc)    Comment: Reference range: <100 . Desirable range <100 mg/dL for primary prevention;   <70 mg/dL for patients with CHD or diabetic patients  with > or = 2 CHD risk factors. . Marland KitchenDL-C is now calculated using  the Martin-Hopkins  calculation, which is a validated novel method providing  better accuracy than the Friedewald equation in the  estimation of LDL-C.  MaCresenciano Genret al. JAAnnamaria [id number];[id number]  (http://education.[employer].com/faq/FAQ164)    Total CHOL/HDL Ratio 2.4 <5.0 (calc)   Non-HDL Cholesterol (Calc) 74 <130 mg/dL (calc)    Comment: For patients with diabetes plus 1 major ASCVD risk  factor, treating to a non-HDL-C goal of <100 mg/dL  (LDL-C of <70 mg/dL) is considered a therapeutic  option.   TSH     Status: None   Collection Time: 10/03/17 10:07 AM  Result Value Ref Range   TSH 0.45 0.40 - 4.50 mIU/L  Iron, TIBC and Ferritin Panel     Status: None   Collection Time: 10/03/17 10:07 AM  Result Value Ref Range   Iron 59 45 - 160 mcg/dL   TIBC 342 250 - 450 mcg/dL (calc)   %SAT 17 11 - 50 % (calc)   Ferritin 21 20 - 288 ng/mL     Assessment & Plan  1. Acute non-recurrent maxillary  sinusitis  Likely viral , explained that we will try nasal steroids, saline spray, allergy medication and if no improvement she can call back in 3 days for a rx of Azithromycin 500 mg for 3 days.   2. Cough  - guaiFENesin (MUCINEX) 600 MG 12 hr tablet; Take 1 tablet (600 mg total) by mouth 2 (two) times daily as needed for cough or to loosen phlegm.  Dispense: 20 tablet; Refill: 0  4. Other conjunctivitis of right eye  - ciprofloxacin (CILOXAN) 0.3 % ophthalmic solution; Place 1 drop into the right eye every 2 (two) hours. Administer 1 drop, every 2 hours, while awake, for 2 days. Then 1 drop, every 4 hours, while awake, for the next 5 days.  Dispense: 5 mL; Refill: 0  5. Controlled type 2 diabetes mellitus with stage 3 chronic kidney disease, without long-term current use of insulin ([place])  Discussed importance of monitoring glucose when sick, and to contact me if glucose stays below 90 or above 180  Discussed risk of treating viral infections with antibiotics  and when to call  us back

## 2018-01-04 ENCOUNTER — Ambulatory Visit (INDEPENDENT_AMBULATORY_CARE_PROVIDER_SITE_OTHER): Payer: Medicare HMO

## 2018-01-04 ENCOUNTER — Ambulatory Visit (INDEPENDENT_AMBULATORY_CARE_PROVIDER_SITE_OTHER): Payer: Medicare HMO | Admitting: Family Medicine

## 2018-01-04 ENCOUNTER — Encounter: Payer: Self-pay | Admitting: Family Medicine

## 2018-01-04 VITALS — BP 140/66 | HR 86 | Temp 97.6°F | Resp 18 | Ht 65.0 in | Wt 223.9 lb

## 2018-01-04 VITALS — BP 140/66 | HR 86 | Temp 97.6°F | Resp 18 | Ht 65.0 in | Wt 223.0 lb

## 2018-01-04 DIAGNOSIS — E1169 Type 2 diabetes mellitus with other specified complication: Secondary | ICD-10-CM

## 2018-01-04 DIAGNOSIS — K219 Gastro-esophageal reflux disease without esophagitis: Secondary | ICD-10-CM

## 2018-01-04 DIAGNOSIS — I272 Pulmonary hypertension, unspecified: Secondary | ICD-10-CM

## 2018-01-04 DIAGNOSIS — R232 Flushing: Secondary | ICD-10-CM

## 2018-01-04 DIAGNOSIS — F330 Major depressive disorder, recurrent, mild: Secondary | ICD-10-CM | POA: Diagnosis not present

## 2018-01-04 DIAGNOSIS — E785 Hyperlipidemia, unspecified: Secondary | ICD-10-CM

## 2018-01-04 DIAGNOSIS — I10 Essential (primary) hypertension: Secondary | ICD-10-CM

## 2018-01-04 DIAGNOSIS — Z0000 Encounter for general adult medical examination without abnormal findings: Secondary | ICD-10-CM | POA: Diagnosis not present

## 2018-01-04 DIAGNOSIS — Z01419 Encounter for gynecological examination (general) (routine) without abnormal findings: Secondary | ICD-10-CM

## 2018-01-04 DIAGNOSIS — G4733 Obstructive sleep apnea (adult) (pediatric): Secondary | ICD-10-CM | POA: Diagnosis not present

## 2018-01-04 DIAGNOSIS — I209 Angina pectoris, unspecified: Secondary | ICD-10-CM

## 2018-01-04 MED ORDER — LISINOPRIL 40 MG PO TABS: 40.0000 mg | ORAL_TABLET | Freq: Every day | ORAL | 1 refills | Status: DC

## 2018-01-04 MED ORDER — ISOSORBIDE MONONITRATE ER 60 MG PO TB24: 60.0000 mg | ORAL_TABLET | Freq: Every day | ORAL | 1 refills | Status: DC

## 2018-01-04 MED ORDER — VENLAFAXINE HCL ER 150 MG PO CP24: 150.0000 mg | ORAL_CAPSULE | Freq: Every day | ORAL | 1 refills | Status: DC

## 2018-01-04 MED ORDER — ROSUVASTATIN CALCIUM 20 MG PO TABS: 20.0000 mg | ORAL_TABLET | Freq: Every day | ORAL | 1 refills | Status: DC

## 2018-01-04 MED ORDER — PANTOPRAZOLE SODIUM 40 MG PO TBEC: 40.0000 mg | DELAYED_RELEASE_TABLET | Freq: Every morning | ORAL | 1 refills | Status: DC

## 2018-01-04 NOTE — Progress Notes (Signed)
Name: Dana Sparks   MRN: [id number]    DOB: 1950-12-21   Date:01/04/2018       Progress Note  Subjective  Chief Complaint  Chief Complaint  Patient presents with  . Annual Exam  . Follow-up    HPI  Patient presents for annual CPE and follow up  DMII with gastroparesis/CKI: she is taking medication as prescribed, seeing Dr. Gabriel Carina now, she started her glucose has been all over the place, she had an episode of hypoglycemia yesterday with glucose of 64, advised to discuss with Dr. Gabriel Carina. Her hgbA1C is elevated - last hgbA1C 7.3 % , 7.7% but now down to 6.8% last checked by endo was 7.1%, here in the office was 7.3%. She has a follow up with Dr. Gabriel Carina coming up  HTN: she denies dizziness, but bp is at goal,  no chest pain or SOB at this time. She is compliant with medication  Angina: CAD, her cardiologist is Dr. Clayborn Bigness, taking Imdur daily and Toprol XL and has chest pain once or twice a week, it lasts minutes and resolves by itself without SL nitroglycerin.  She is also on statin therapy, aspirin and ace. She is on disability for it for many years, but now on social security.  Still sees Dr. Clayborn Bigness, reviewed records and Echo from 2016 showed decreased in EF 25-30% and global hypokinesis, she has follow up with Dr. Clayborn Bigness 05/2018  OSA: she is using a CPAP machine every night, all night long, also has mild pulmonary hypertension    Major Depression Mild: she denies crying spells. She has some fatigue, also anhedonia, she is taking antidepressants currently on Effexor and is doing better than when on Citalopram.   Morbid obesity: she continues to exercise and eating healthier and has lost 7 lbs since last visit, continue the hard work..    Hyperlipidemia: taking statin therapy, reviewed labs with patient, and at goal  History of anemia: last labs normal, used to see Dr. Mike Gip and is on iron supplements  USPSTF grade A and B recommendations  Depression:  Depression screen  [facility] 2/9 01/04/2018 09/03/2017 05/23/2017 01/19/2017 07/17/2016  Decreased Interest 1 0 0 0 0  Down, Depressed, Hopeless 0 0 0 0 0  PHQ - 2 Score 1 0 0 0 0  Altered sleeping 0 - - - -  Tired, decreased energy 1 - - - -  Change in appetite 0 - - - -  Feeling bad or failure about yourself  1 - - - -  Trouble concentrating 0 - - - -  Moving slowly or fidgety/restless 0 - - - -  Suicidal thoughts 0 - - - -  PHQ-9 Score 3 - - - -   Hypertension: BP Readings from Last 3 Encounters:  01/04/18 140/66  12/06/17 140/76  11/01/17 124/82   Obesity: Wt Readings from Last 3 Encounters:  01/04/18 223 lb 14.4 oz (101.6 kg)  12/06/17 229 lb 11.2 oz (104.2 kg)  11/01/17 233 lb 11.2 oz (106 kg)   BMI Readings from Last 3 Encounters:  01/04/18 37.26 kg/m  12/06/17 37.07 kg/m  11/01/17 37.72 kg/m   STD testing and prevention (chl/gon/syphilis/HIV): not interested Intimate partner violence:negative screen  Sexual History/Pain during Intercourse: not sexually active for year Menstrual History/LMP/Abnormal Bleeding: s/p hysterectomy  Incontinence Symptoms: no symptoms  Advanced Care Planning: A voluntary discussion about advance care planning including the explanation and discussion of advance directives.  Discussed health care proxy and Living will, and the patient  was able to identify a health care proxy as .  Patient does not have a living will at present time. If patient does have living will.  Breast cancer: 04/2017 BRCA gene screening: N/A Cervical cancer screening: N/A  Osteoporosis:  Bone density reviewed  Fall prevention/vitamin D: discussed high calcium and vitamin D diet Lipids:  Lab Results  Component Value Date   CHOL 128 10/03/2017   CHOL 121 02/27/2017   CHOL 65 04/30/2016   Lab Results  Component Value Date   HDL 54 10/03/2017   HDL 49 (L) 02/27/2017   HDL 28 (L) 04/30/2016   Lab Results  Component Value Date   LDLCALC 55 10/03/2017   LDLCALC 56 02/27/2017    LDLCALC 26 04/30/2016   Lab Results  Component Value Date   TRIG 107 10/03/2017   TRIG 82 02/27/2017   TRIG 57 04/30/2016   Lab Results  Component Value Date   CHOLHDL 2.4 10/03/2017   CHOLHDL 2.5 02/27/2017   CHOLHDL 2.3 04/30/2016   No results found for: LDLDIRECT  Glucose:  Glucose  Date Value Ref Range Status  05/15/2012 155 (H) 65 - 99 mg/dL Final  05/08/2012 80 65 - 99 mg/dL Final  02/07/2012 144 (H) 65 - 99 mg/dL Final   Glucose, Bld  Date Value Ref Range Status  10/03/2017 125 (H) 65 - 99 mg/dL Final    Comment:    .            Fasting reference interval . For someone without known diabetes, a glucose value between 100 and 125 mg/dL is consistent with prediabetes and should be confirmed with a follow-up test. .   02/27/2017 98 65 - 99 mg/dL Final  01/19/2017 132 (H) 65 - 99 mg/dL Final   Glucose-Capillary  Date Value Ref Range Status  05/02/2016 281 (H) 65 - 99 mg/dL Final  05/02/2016 293 (H) 65 - 99 mg/dL Final  05/01/2016 254 (H) 65 - 99 mg/dL Final    Skin cancer: discussed looking for atypical lesions  Colorectal cancer: 2015. Lung cancer:   Low Dose CT Chest recommended if Age 67-80 years, 30 pack-year currently smoking OR have quit w/in 67years. Patient does qualify.  She is not interested in screen for lung cancer Aspirin: taking daily ECG: sees cardiologist    Patient Active Problem List   Diagnosis Date Noted  . Type II or unspecified type diabetes mellitus with neurological manifestations, not stated as uncontrolled(250.60) 09/03/2017  . Nasal polyp 09/03/2017  . Lipoma of back 09/03/2017  . Claudication ([place]) 06/04/2017  . LVH (left ventricular hypertrophy) 02/27/2017  . Decreased cardiac ejection fraction 02/27/2017  . Left hand weakness 10/12/2016  . Elevated antinuclear antibody (ANA) level 04/27/2016  . Angina pectoris ([place]) 11/01/2015  . Well controlled type 2 diabetes mellitus with gastroparesis ([place]) 06/22/2015  . Low TSH  level 06/22/2015  . Iron deficiency anemia 04/07/2015  . Intestinal metaplasia of gastric mucosa 03/11/2015  . CAD (coronary artery disease), native coronary artery 02/24/2015  . History of coronary artery stent placement 02/24/2015  . Chronic kidney disease, stage 3, mod decreased GFR (HCC) 02/24/2015  . Menopause 02/24/2015  . History of Helicobacter pylori infection 02/24/2015  . Mild mitral insufficiency 02/24/2015  . Mild tricuspid insufficiency 02/24/2015  . 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 ([place]) 02/24/2015  . Controlled gout 02/24/2015  . Depression, major, recurrent, mild ([place]) 02/24/2015  . Morbid obesity due to excess calories ([place]) 02/24/2015  . Mild  pulmonary hypertension ([place]) 02/24/2015  . Gastroesophageal reflux disease without esophagitis 02/24/2015  . Perennial allergic rhinitis 02/24/2015  . HLD (hyperlipidemia) 02/20/2015  . Hypertension, benign 02/20/2015  . Apnea, sleep 02/20/2015  . Controlled diabetes mellitus with stage 3 chronic kidney disease, without long-term current use of insulin ([place]) 02/20/2015    Past Surgical History:  Procedure Laterality Date  . ABDOMINAL HYSTERECTOMY     total  . CATARACT EXTRACTION    . COLONOSCOPY  01/2014   sigmoid diverticulosis. desc colon TA, hyperplastic polyp  . CORONARY ANGIOPLASTY WITH STENT PLACEMENT    . ESOPHAGOGASTRODUODENOSCOPY N/A 02/16/2015   negative h pylori, focal gastic intestinal metaplasia  . TOTAL KNEE ARTHROPLASTY Right     Family History  Problem Relation Age of Onset  . Stroke Sister   . Alcohol abuse Brother   . Diabetes Brother   . Hypertension Mother   . Diabetes Mother   . Heart disease Mother   . Diabetes Father   . Heart disease Father   . Hypertension Father   . Hypertension Sister   . Cancer Maternal Grandmother        Unsure  . Diabetes Maternal Grandfather   . Colon cancer Neg Hx   . Liver disease Neg Hx   . Breast cancer Neg Hx     Social  History   Socioeconomic History  . Marital status: Married    Spouse name: Rosalva Neary   . Number of children: 2  . Years of education: Not on file  . Highest education level: Some college, no degree  Occupational History    Comment: was disable on social security now  Social Needs  . Financial resource strain: Hard  . Food insecurity:    Worry: Never true    Inability: Never true  . Transportation needs:    Medical: No    Non-medical: No  Tobacco Use  . Smoking status: Former Smoker    Packs/day: 1.00    Years: 40.00    Pack years: 40.00    Types: Cigarettes    Start date: 09/04/1972    Last attempt to quit: 12/30/2012    Years since quitting: 5.0  . Smokeless tobacco: Never Used  Substance and Sexual Activity  . Alcohol use: No    Alcohol/week: 0.0 oz  . Drug use: No  . Sexual activity: Not Currently  Lifestyle  . Physical activity:    Days per week: 5 days    Minutes per session: 90 min  . Stress: Not at all  Relationships  . Social connections:    Talks on phone: More than three times a week    Gets together: More than three times a week    Attends religious service: More than 4 times per year    Active member of club or organization: Yes    Attends meetings of clubs or organizations: More than 4 times per year    Relationship status: Married  . Intimate partner violence:    Fear of current or ex partner: No    Emotionally abused: No    Physically abused: No    Forced sexual activity: No  Other Topics Concern  . Not on file  Social History Narrative   Married - current 97rd,    Two children from first marriage   He has 4 children from previous marriage     Current Outpatient Medications:  .  ACCU-CHEK FASTCLIX LANCETS MISC, , Disp: , Rfl:  .  ACCU-CHEK SMARTVIEW  test strip, Check fsbs two times daily  DMII, Disp: 100 each, Rfl: 6 .  albuterol (PROVENTIL) (2.5 MG/3ML) 0.083% nebulizer solution, Take 3 mLs (2.5 mg total) by nebulization every 6 (six) hours  as needed for wheezing or shortness of breath., Disp: 75 mL, Rfl: 0 .  Alcohol Swabs (B-D SINGLE USE SWABS REGULAR) PADS, 1 each by Does not apply route 4 (four) times daily., Disp: 200 each, Rfl: 2 .  aspirin EC 81 MG tablet, Take 81 mg by mouth every morning. , Disp: , Rfl:  .  BD INSULIN SYRINGE U/F 31G X 5/16" 0.3 ML MISC, , Disp: , Rfl:  .  blood glucose meter kit and supplies KIT, Dispense based on patient and insurance preference. Use up to four times daily as directed. (FOR ICD-9 250.00, 250.01)., Disp: 1 each, Rfl: 0 .  clindamycin (CLEOCIN) 150 MG capsule, Take 150 mg by mouth 4 (four) times daily. Take 4 tablets at once one hour before dental appointment., Disp: , Rfl:  .  Coenzyme Q10 100 MG capsule, TAKE 1 CAPSULE EVERY DAY, Disp: 90 capsule, Rfl: 1 .  Ferrous Sulfate (IRON) 325 (65 Fe) MG TABS, Take 325 mg by mouth 2 (two) times daily. Nature made brand-  Pt takes one tablet twice a day , Disp: , Rfl:  .  fluticasone (FLONASE) 50 MCG/ACT nasal spray, Place 2 sprays into both nostrils daily., Disp: 48 g, Rfl: 2 .  glipiZIDE (GLUCOTROL XL) 10 MG 24 hr tablet, TAKE 1 TABLET EVERY DAY WITH BREAKFAST, Disp: 90 tablet, Rfl: 1 .  glucose blood test strip, Use 2 (two) times daily., Disp: , Rfl:  .  insulin NPH Human (HUMULIN N,NOVOLIN N) 100 UNIT/ML injection, Inject 15-20 Units into the skin 2 (two) times daily before a meal. 15 units in the morning and 20 units at bedtime, Disp: , Rfl:  .  isosorbide mononitrate (IMDUR) 60 MG 24 hr tablet, Take 1 tablet (60 mg total) by mouth daily., Disp: 90 tablet, Rfl: 1 .  LANCETS MICRO THIN 33G MISC, Use 1 each 2 (two) times daily. accu chek. Diagnosis: E11.22, Disp: , Rfl:  .  lisinopril (PRINIVIL,ZESTRIL) 40 MG tablet, Take 1 tablet (40 mg total) daily by mouth., Disp: 90 tablet, Rfl: 1 .  loratadine (CLARITIN) 10 MG tablet, Take by mouth., Disp: , Rfl:  .  meloxicam (MOBIC) 7.5 MG tablet, Take by mouth., Disp: , Rfl:  .  metoprolol succinate  (TOPROL-XL) 50 MG 24 hr tablet, Take 1 tablet (50 mg total) by mouth daily., Disp: 90 tablet, Rfl: 3 .  montelukast (SINGULAIR) 10 MG tablet, Take 1 tablet by mouth every evening., Disp: , Rfl:  .  pantoprazole (PROTONIX) 40 MG tablet, Take 1 tablet (40 mg total) by mouth every morning., Disp: 90 tablet, Rfl: 1 .  rosuvastatin (CRESTOR) 20 MG tablet, Take 1 tablet (20 mg total) by mouth daily., Disp: 90 tablet, Rfl: 1 .  venlafaxine XR (EFFEXOR-XR) 150 MG 24 hr capsule, Take 1 capsule (150 mg total) by mouth daily with breakfast. First week take one with Celexa after that take 2 daily and stop celexa (Patient taking differently: Take 150 mg by mouth 2 (two) times daily. ), Disp: 90 capsule, Rfl: 1 .  allopurinol (ZYLOPRIM) 100 MG tablet, Take 1 tablet (100 mg total) by mouth daily., Disp: 90 tablet, Rfl: 3  Allergies  Allergen Reactions  . Codeine Other (See Comments)    Unknown reaction  . Contrast Media [Iodinated Diagnostic Agents] Itching  .  Sulfa Antibiotics Itching     ROS  Constitutional: Negative for fever , positive for  weight change.  Respiratory: Negative for cough and shortness of breath.   Cardiovascular: Negative for chest pain or palpitations.  Gastrointestinal: Negative for abdominal pain, no bowel changes.  Musculoskeletal: positive  for gait problem , positive for right knee  joint swelling.  Skin: Negative for rash.  Neurological: Negative for dizziness or headache.  No other specific complaints in a complete review of systems (except as listed in HPI above).   Objective  Vitals:   01/04/18 0958  BP: 140/66  Pulse: 86  Resp: 18  Temp: 97.6 F (36.4 C)  TempSrc: Oral  SpO2: 94%  Weight: 223 lb 14.4 oz (101.6 kg)  Height: _0  (1.651 m)    Body mass index is 37.26 kg/m.  Physical Exam   Constitutional: Patient appears well-developed and well-nourished, obese. No distress.  HENT: Head: Normocephalic and atraumatic. Ears: B TMs ok, no erythema or  effusion; Nose: Nose normal. Mouth/Throat: Oropharynx is clear and moist. No oropharyngeal exudate.  Eyes: Conjunctivae and EOM are normal. Pupils are equal, round, and reactive to light. No scleral icterus.  Neck: Normal range of motion. Neck supple. No JVD present. No thyromegaly present.  Cardiovascular: Normal rate, regular rhythm and normal heart sounds.  No murmur heard. No BLE edema. Pulmonary/Chest: Effort normal and breath sounds normal. No respiratory distress. Abdominal: Soft. Bowel sounds are normal, no distension. There is no tenderness. no masses Breast: no lumps or masses, no nipple discharge or rashes FEMALE GENITALIA:  Either vitiligo or post-inflammatory changes on vulva RECTAL: normal external exam, rectal exam not done Musculoskeletal: still has some effusion of right knee, decrease in extension not quite 180 degree, also flexion about 90 degrees Neurological: he is alert and oriented to person, place, and time. No cranial nerve deficit. Coordination, balance, strength, speech   Skin: Skin is warm and dry. No rash noted. No erythema.  Psychiatric: Patient has a normal mood and affect. behavior is normal. Judgment and thought content normal.    PHQ2/9: Depression screen [facility] 2/9 01/04/2018 09/03/2017 05/23/2017 01/19/2017 07/17/2016  Decreased Interest 1 0 0 0 0  Down, Depressed, Hopeless 0 0 0 0 0  PHQ - 2 Score 1 0 0 0 0  Altered sleeping 0 - - - -  Tired, decreased energy 1 - - - -  Change in appetite 0 - - - -  Feeling bad or failure about yourself  1 - - - -  Trouble concentrating 0 - - - -  Moving slowly or fidgety/restless 0 - - - -  Suicidal thoughts 0 - - - -  PHQ-9 Score 3 - - - -    Fall Risk: Fall Risk  01/04/2018 12/06/2017 10/03/2017 09/03/2017 08/07/2017  Falls in the past year? _1   Number falls in past yr: - - - - -  Injury with Fall? - - - - -  Comment - - - - -     Functional Status Survey: Is the patient deaf or have difficulty  hearing?: No Does the patient have difficulty seeing, even when wearing glasses/contacts?: Yes(prescription glasses) Does the patient have difficulty concentrating, remembering, or making decisions?: No Does the patient have difficulty walking or climbing stairs?: No Does the patient have difficulty dressing or bathing?: No Does the patient have difficulty doing errands alone such as visiting a doctor's office or shopping?: No   Assessment & Plan  1. Well woman exam  Discussed importance of 150 minutes of physical activity weekly, eat two servings of fish weekly, eat one serving of tree nuts ( cashews, pistachios, pecans, almonds.Marland Kitchen) every other day, eat 6 servings of fruit/vegetables daily and drink plenty of water and avoid sweet beverages.   2. Angina pectoris (HCC)  - isosorbide mononitrate (IMDUR) 60 MG 24 hr tablet; Take 1 tablet (60 mg total) by mouth daily.  Dispense: 90 tablet; Refill: 1  3. Hypertension, benign  - lisinopril (PRINIVIL,ZESTRIL) 40 MG tablet; Take 1 tablet (40 mg total) by mouth daily.  Dispense: 90 tablet; Refill: 1  4. Gastroesophageal reflux disease without esophagitis  - pantoprazole (PROTONIX) 40 MG tablet; Take 1 tablet (40 mg total) by mouth every morning.  Dispense: 90 tablet; Refill: 1  5. Dyslipidemia  - rosuvastatin (CRESTOR) 20 MG tablet; Take 1 tablet (20 mg total) by mouth daily.  Dispense: 90 tablet; Refill: 1  6. Hot flashes  - venlafaxine XR (EFFEXOR-XR) 150 MG 24 hr capsule; Take 1 capsule (150 mg total) by mouth daily with breakfast. Daily  Dispense: 90 capsule; Refill: 1  7. Depression, major, recurrent, mild (HCC)  - venlafaxine XR (EFFEXOR-XR) 150 MG 24 hr capsule; Take 1 capsule (150 mg total) by mouth daily with breakfast. Daily  Dispense: 90 capsule; Refill: 1  8. Mild pulmonary hypertension ([place])  Sees cardiologist   9. Dyslipidemia associated with type 2 diabetes mellitus (HCC)  Seeing Dr. Gabriel Carina  10. Morbid obesity due  to excess calories ([place])  Losing weight and exercising daily   11. Obstructive sleep apnea syndrome  Continue CPAP every night

## 2018-01-04 NOTE — Patient Instructions (Signed)
Dana Sparks , Thank you for taking time to come for your Medicare Wellness Visit. I appreciate your ongoing commitment to your health goals. Please review the following plan we discussed and let me know if I can assist you in the future.   Screening recommendations/referrals: Colorectal Screening: Completed 01/20/14. Repeat every 5 years Mammogram: Completed 04/17/17. Repeat every year Bone Density: Completed 04/17/17. Osteoporotic screenings no longer required Lung Cancer Screening: You will receive a call from Burgess Estelle, RN (Oncology Nurse Navigator) to facilitate the scheduling of this exam. Hepatitis C Screening: Completed 01/20/13  Vision and Dental Exams: Recommended annual ophthalmology exams for early detection of glaucoma and other disorders of the eye Recommended annual dental exams for proper oral hygiene  Diabetic Exams: Recommended annual diabetic eye exams for early detection of retinopathy Recommended annual diabetic foot exams for early detection of peripheral neuropathy.  Diabetic Eye Exam: Completed 09/10/17 Diabetic Foot Exam: Completed 09/03/17  Vaccinations: Influenza vaccine: Up to date Pneumococcal vaccine: Completed series Tdap vaccine: Up to date Shingles vaccine: Completed 07/18/12. Please call your insurance company to determine your out of pocket expense for the Shingrix vaccine. You may also receive this vaccine at your local pharmacy or Health Dept.   Advanced directives: Advance directive discussed with you today. I have provided a copy for you to complete at home and have notarized. Once this is complete please bring a copy in to our office so we can scan it into your chart.  Conditions/risks identified: Recommend to drink at least 6-8 8oz glasses of water per day.  Next appointment: Please schedule your Annual Wellness Visit with your Nurse Health Advisor in one year.  Preventive Care 13 Years and Older, Female Preventive care refers to lifestyle choices  and visits with your health care provider that can promote health and wellness. What does preventive care include?  A yearly physical exam. This is also called an annual well check.  Dental exams once or twice a year.  Routine eye exams. Ask your health care provider how often you should have your eyes checked.  Personal lifestyle choices, including:  Daily care of your teeth and gums.  Regular physical activity.  Eating a healthy diet.  Avoiding tobacco and drug use.  Limiting alcohol use.  Practicing safe sex.  Taking low-dose aspirin every day.  Taking vitamin and mineral supplements as recommended by your health care provider. What happens during an annual well check? The services and screenings done by your health care provider during your annual well check will depend on your age, overall health, lifestyle risk factors, and family history of disease. Counseling  Your health care provider may ask you questions about your:  Alcohol use.  Tobacco use.  Drug use.  Emotional well-being.  Home and relationship well-being.  Sexual activity.  Eating habits.  History of falls.  Memory and ability to understand (cognition).  Work and work Statistician.  Reproductive health. Screening  You may have the following tests or measurements:  Height, weight, and BMI.  Blood pressure.  Lipid and cholesterol levels. These may be checked every 5 years, or more frequently if you are over 36 years old.  Skin check.  Lung cancer screening. You may have this screening every year starting at age 43 if you have a 30-pack-year history of smoking and currently smoke or have quit within the past 15 years.  Fecal occult blood test (FOBT) of the stool. You may have this test every year starting at age 59.  Flexible sigmoidoscopy or colonoscopy. You may have a sigmoidoscopy every 5 years or a colonoscopy every 10 years starting at age 53.  Hepatitis C blood test.  Hepatitis  B blood test.  Sexually transmitted disease (STD) testing.  Diabetes screening. This is done by checking your blood sugar (glucose) after you have not eaten for a while (fasting). You may have this done every 1-3 years.  Bone density scan. This is done to screen for osteoporosis. You may have this done starting at age 90.  Mammogram. This may be done every 1-2 years. Talk to your health care provider about how often you should have regular mammograms. Talk with your health care provider about your test results, treatment options, and if necessary, the need for more tests. Vaccines  Your health care provider may recommend certain vaccines, such as:  Influenza vaccine. This is recommended every year.  Tetanus, diphtheria, and acellular pertussis (Tdap, Td) vaccine. You may need a Td booster every 10 years.  Zoster vaccine. You may need this after age 52.  Pneumococcal 13-valent conjugate (PCV13) vaccine. One dose is recommended after age 70.  Pneumococcal polysaccharide (PPSV23) vaccine. One dose is recommended after age 58. Talk to your health care provider about which screenings and vaccines you need and how often you need them. This information is not intended to replace advice given to you by your health care provider. Make sure you discuss any questions you have with your health care provider. Document Released: 09/17/2015 Document Revised: 05/10/2016 Document Reviewed: 06/22/2015 Elsevier Interactive Patient Education  2017 [place] Prevention in the Home Falls can cause injuries. They can happen to people of all ages. There are many things you can do to make your home safe and to help prevent falls. What can I do on the outside of my home?  Regularly fix the edges of walkways and driveways and fix any cracks.  Remove anything that might make you trip as you walk through a door, such as a raised step or threshold.  Trim any bushes or trees on the path to your  home.  Use bright outdoor lighting.  Clear any walking paths of anything that might make someone trip, such as rocks or tools.  Regularly check to see if handrails are loose or broken. Make sure that both sides of any steps have handrails.  Any raised decks and porches should have guardrails on the edges.  Have any leaves, snow, or ice cleared regularly.  Use sand or salt on walking paths during winter.  Clean up any spills in your garage right away. This includes oil or grease spills. What can I do in the bathroom?  Use night lights.  Install grab bars by the toilet and in the tub and shower. Do not use towel bars as grab bars.  Use non-skid mats or decals in the tub or shower.  If you need to sit down in the shower, use a plastic, non-slip stool.  Keep the floor dry. Clean up any water that spills on the floor as soon as it happens.  Remove soap buildup in the tub or shower regularly.  Attach bath mats securely with double-sided non-slip rug tape.  Do not have throw rugs and other things on the floor that can make you trip. What can I do in the bedroom?  Use night lights.  Make sure that you have a light by your bed that is easy to reach.  Do not use any sheets or blankets  that are too big for your bed. They should not hang down onto the floor.  Have a firm chair that has side arms. You can use this for support while you get dressed.  Do not have throw rugs and other things on the floor that can make you trip. What can I do in the kitchen?  Clean up any spills right away.  Avoid walking on wet floors.  Keep items that you use a lot in easy-to-reach places.  If you need to reach something above you, use a strong step stool that has a grab bar.  Keep electrical cords out of the way.  Do not use floor polish or wax that makes floors slippery. If you must use wax, use non-skid floor wax.  Do not have throw rugs and other things on the floor that can make you  trip. What can I do with my stairs?  Do not leave any items on the stairs.  Make sure that there are handrails on both sides of the stairs and use them. Fix handrails that are broken or loose. Make sure that handrails are as long as the stairways.  Check any carpeting to make sure that it is firmly attached to the stairs. Fix any carpet that is loose or worn.  Avoid having throw rugs at the top or bottom of the stairs. If you do have throw rugs, attach them to the floor with carpet tape.  Make sure that you have a light switch at the top of the stairs and the bottom of the stairs. If you do not have them, ask someone to add them for you. What else can I do to help prevent falls?  Wear shoes that:  Do not have high heels.  Have rubber bottoms.  Are comfortable and fit you well.  Are closed at the toe. Do not wear sandals.  If you use a stepladder:  Make sure that it is fully opened. Do not climb a closed stepladder.  Make sure that both sides of the stepladder are locked into place.  Ask someone to hold it for you, if possible.  Clearly mark and make sure that you can see:  Any grab bars or handrails.  First and last steps.  Where the edge of each step is.  Use tools that help you move around (mobility aids) if they are needed. These include:  Canes.  Walkers.  Scooters.  Crutches.  Turn on the lights when you go into a dark area. Replace any light bulbs as soon as they burn out.  Set up your furniture so you have a clear path. Avoid moving your furniture around.  If any of your floors are uneven, fix them.  If there are any pets around you, be aware of where they are.  Review your medicines with your doctor. Some medicines can make you feel dizzy. This can increase your chance of falling. Ask your doctor what other things that you can do to help prevent falls. This information is not intended to replace advice given to you by your health care provider. Make  sure you discuss any questions you have with your health care provider. Document Released: 06/17/2009 Document Revised: 01/27/2016 Document Reviewed: 09/25/2014 Elsevier Interactive Patient Education  2017 [employer].

## 2018-01-04 NOTE — Progress Notes (Addendum)
Subjective:   Dana Sparks is a 67 y.o. female who presents for Medicare Annual (Subsequent) preventive examination.  Review of Systems:  N/A Cardiac Risk Factors include: advanced age (>94mn, >>77women);diabetes mellitus;dyslipidemia;hypertension;obesity (BMI >30kg/m2)     Objective:     Vitals: BP 140/66   Pulse 86   Temp 97.6 F (36.4 C) (Oral)   Resp 18   Ht _0  (1.651 m)   Wt 223 lb (101.2 kg)   SpO2 94%   BMI 37.11 kg/m   Body mass index is 37.11 kg/m.  Advanced Directives 01/04/2018 06/04/2017 05/23/2017 01/19/2017 01/19/2017 11/10/2016 08/25/2016  Does Patient Have a Medical Advance Directive? _1  No No  Type of Advance Directive - - - - - - -  Does patient want to make changes to medical advance directive? - - - - - - -  [place]? - - - - - - -  Would patient like information on creating a medical advance directive? Yes (MAU/Ambulatory/Procedural Areas - Information given) - - No - Patient declined - - No - Patient declined    Tobacco Social History   Tobacco Use  Smoking Status Former Smoker  . Packs/day: 1.00  . Years: 40.00  . Pack years: 40.00  . Types: Cigarettes  . Start date: 09/04/1972  . Last attempt to quit: 12/30/2012  . Years since quitting: 5.0  Smokeless Tobacco Never Used  Tobacco Comment   smoking cessation materials not required     Counseling given: No Comment: smoking cessation materials not required  Clinical Intake:  Pre-visit preparation completed: Yes  Pain : No/denies pain   BMI - recorded: 37.11 Nutritional Status: BMI > 30  Obese Nutrition Risk Assessment: Has the patient had any N/V/D within the last 2 months?  No Does the patient have any non-healing wounds?  No Has the patient had any unintentional weight loss or weight gain?  No  Is the patient diabetic?  Yes If diabetic, was a CBG obtained today?  No Did the patient bring in their glucometer from home?  No Comments: Pt  monitors CBG's daily. Denies any financial strains with the device or supplies.  Diabetic Exams: Diabetic Eye Exam: Completed 09/10/17. Diabetic Foot Exam: Completed 09/03/17  How often do you need to have someone help you when you read instructions, pamphlets, or other written materials from your doctor or pharmacy?: 1 - Never  Interpreter Needed?: No  Information entered by :: AEversole, LPN  Hospitalizations/ED visits and surgeries occurring within the previous 12 months:  Within the previous 12 months, pt has not underwent any surgical procedures, has not been hospitalized for any conditions and has not been treated by an emergency room clinician.  Past Medical History:  Diagnosis Date  . Allergy   . Anxiety   . Arthritis   . CAD (coronary artery disease)   . COPD (chronic obstructive pulmonary disease) ([place]   . Depression   . Diabetes mellitus without complication ([place]   . Diverticulitis   . Gastroparesis   . Gout   . Hyperlipemia   . Hypertension   . Positive H. pylori test   . Renal insufficiency   . Sleep apnea   . [place]rombocytosis ([place] 01/28/2015  . Tubular adenoma of colon 01/20/14   Past Surgical History:  Procedure Laterality Date  . ABDOMINAL HYSTERECTOMY     total  . CATARACT EXTRACTION    . COLONOSCOPY  01/2014  sigmoid diverticulosis. desc colon TA, hyperplastic polyp  . CORONARY ANGIOPLASTY WITH STENT PLACEMENT    . ESOPHAGOGASTRODUODENOSCOPY N/A 02/16/2015   negative h pylori, focal gastic intestinal metaplasia  . TOTAL KNEE ARTHROPLASTY Right    Family History  Problem Relation Age of Onset  . Stroke Sister   . Hyperlipidemia Sister   . Alcohol abuse Brother   . Diabetes Brother   . Stroke Brother   . Hypertension Mother   . Diabetes Mother   . Heart disease Mother   . Diabetes Father   . Heart disease Father   . Hypertension Father   . Hypertension Sister   . Cancer Maternal Grandmother        Unsure  . Diabetes Maternal Grandfather   .  Colon cancer Neg Hx   . Liver disease Neg Hx   . Breast cancer Neg Hx    Social History   Socioeconomic History  . Marital status: Married    Spouse name: Fritz Pickerel  . Number of children: 2  . Years of education: some college  . Highest education level: 12th grade  Occupational History  . Occupation: Disabled  Social Needs  . Financial resource strain: Hard  . Food insecurity:    Worry: Never true    Inability: Never true  . Transportation needs:    Medical: No    Non-medical: No  Tobacco Use  . Smoking status: Former Smoker    Packs/day: 1.00    Years: 40.00    Pack years: 40.00    Types: Cigarettes    Start date: 09/04/1972    Last attempt to quit: 12/30/2012    Years since quitting: 5.0  . Smokeless tobacco: Never Used  . Tobacco comment: smoking cessation materials not required  Substance and Sexual Activity  . Alcohol use: No    Alcohol/week: 0.0 oz  . Drug use: No  . Sexual activity: Not Currently  Lifestyle  . Physical activity:    Days per week: 5 days    Minutes per session: 90 min  . Stress: Not at all  Relationships  . Social connections:    Talks on phone: More than three times a week    Gets together: More than three times a week    Attends religious service: More than 4 times per year    Active member of club or organization: Yes    Attends meetings of clubs or organizations: More than 4 times per year    Relationship status: Married  Other Topics Concern  . Not on file  Social History Narrative   Married - current 68rd,    Two children from first marriage   He has 4 children from previous marriage    Outpatient Encounter Medications as of 01/04/2018  Medication Sig  . ACCU-CHEK FASTCLIX LANCETS MISC   . ACCU-CHEK SMARTVIEW test strip Check fsbs two times daily  DMII  . albuterol (PROVENTIL) (2.5 MG/3ML) 0.083% nebulizer solution Take 3 mLs (2.5 mg total) by nebulization every 6 (six) hours as needed for wheezing or shortness of breath.  . Alcohol  Swabs (B-D SINGLE USE SWABS REGULAR) PADS 1 each by Does not apply route 4 (four) times daily.  Marland Kitchen allopurinol (ZYLOPRIM) 100 MG tablet Take 1 tablet (100 mg total) by mouth daily.  Marland Kitchen aspirin EC 81 MG tablet Take 81 mg by mouth every morning.   . BD INSULIN SYRINGE U/F 31G X 5/16" 0.3 ML MISC   . blood glucose meter kit and supplies  KIT Dispense based on patient and insurance preference. Use up to four times daily as directed. (FOR ICD-9 250.00, 250.01).  . clindamycin (CLEOCIN) 150 MG capsule Take 150 mg by mouth 4 (four) times daily. Take 4 tablets at once one hour before dental appointment.  . Coenzyme Q10 100 MG capsule TAKE 1 CAPSULE EVERY DAY  . Ferrous Sulfate (IRON) 325 (65 Fe) MG TABS Take 325 mg by mouth 2 (two) times daily. Petra Kuba made brand-  Pt takes one tablet twice a day   . fluticasone (FLONASE) 50 MCG/ACT nasal spray Place 2 sprays into both nostrils daily.  Marland Kitchen glipiZIDE (GLUCOTROL XL) 10 MG 24 hr tablet TAKE 1 TABLET EVERY DAY WITH BREAKFAST  . glucose blood test strip Use 2 (two) times daily.  . insulin NPH Human (HUMULIN N,NOVOLIN N) 100 UNIT/ML injection Inject 15-20 Units into the skin 2 (two) times daily before a meal. 15 units in the morning and 20 units at bedtime  . isosorbide mononitrate (IMDUR) 60 MG 24 hr tablet Take 1 tablet (60 mg total) by mouth daily.  Marland Kitchen LANCETS MICRO THIN 33G MISC Use 1 each 2 (two) times daily. accu chek. Diagnosis: E11.22  . lisinopril (PRINIVIL,ZESTRIL) 40 MG tablet Take 1 tablet (40 mg total) by mouth daily.  Marland Kitchen loratadine (CLARITIN) 10 MG tablet Take by mouth.  . meloxicam (MOBIC) 7.5 MG tablet Take by mouth.  . metoprolol succinate (TOPROL-XL) 50 MG 24 hr tablet Take 1 tablet (50 mg total) by mouth daily.  . montelukast (SINGULAIR) 10 MG tablet Take 1 tablet by mouth every evening.  . pantoprazole (PROTONIX) 40 MG tablet Take 1 tablet (40 mg total) by mouth every morning.  . rosuvastatin (CRESTOR) 20 MG tablet Take 1 tablet (20 mg total) by  mouth daily.  Marland Kitchen venlafaxine XR (EFFEXOR-XR) 150 MG 24 hr capsule Take 1 capsule (150 mg total) by mouth daily with breakfast. Daily  . [DISCONTINUED] budesonide-formoterol (SYMBICORT) 80-4.5 MCG/ACT inhaler Inhale 2 puffs into the lungs 2 (two) times daily. (Patient not taking: Reported on 01/04/2018)  . [DISCONTINUED] ciprofloxacin (CILOXAN) 0.3 % ophthalmic solution Place 1 drop into the right eye every 2 (two) hours. Administer 1 drop, every 2 hours, while awake, for 2 days. Then 1 drop, every 4 hours, while awake, for the next 5 days. (Patient not taking: Reported on 01/04/2018)  . [DISCONTINUED] guaiFENesin (MUCINEX) 600 MG 12 hr tablet Take 1 tablet (600 mg total) by mouth 2 (two) times daily as needed for cough or to loosen phlegm.  . [DISCONTINUED] ipratropium (ATROVENT) 0.03 % nasal spray Place 2 sprays into the nose 3 (three) times daily.  . [DISCONTINUED] isosorbide mononitrate (IMDUR) 60 MG 24 hr tablet Take 1 tablet (60 mg total) by mouth daily.  . [DISCONTINUED] lisinopril (PRINIVIL,ZESTRIL) 40 MG tablet Take 1 tablet (40 mg total) daily by mouth.  . [DISCONTINUED] pantoprazole (PROTONIX) 40 MG tablet Take 1 tablet (40 mg total) by mouth every morning.  . [DISCONTINUED] rosuvastatin (CRESTOR) 20 MG tablet Take 1 tablet (20 mg total) by mouth daily.  . [DISCONTINUED] traMADol (ULTRAM) 50 MG tablet Take 1 tablet (50 mg total) by mouth every 8 (eight) hours as needed.  . [DISCONTINUED] venlafaxine XR (EFFEXOR-XR) 150 MG 24 hr capsule Take 1 capsule (150 mg total) by mouth daily with breakfast. First week take one with Celexa after that take 2 daily and stop celexa (Patient taking differently: Take 150 mg by mouth 2 (two) times daily. )   No facility-administered encounter medications  on file as of 01/04/2018.     Activities of Daily Living In your present state of health, do you have any difficulty performing the following activities: 01/04/2018 01/04/2018  Hearing? N N  Comment denies hearing  aids -  Vision? N Y  Comment wears eyeglasses; L cataract prescription glasses  Difficulty concentrating or making decisions? N N  Walking or climbing stairs? Y N  Comment R knee -  Dressing or bathing? N N  Doing errands, shopping? N N  Preparing Food and eating ? N -  Comment upper partial dentures -  Using the Toilet? N -  In the past six months, have you accidently leaked urine? N -  Do you have problems with loss of bowel control? N -  Managing your Medications? N -  Managing your Finances? N -  Housekeeping or managing your Housekeeping? N -  Some recent data might be hidden    Patient Care Team: Steele Sizer, MD as PCP - General (Family Medicine) Lucilla Lame, MD as Consulting Physician (Gastroenterology) Yolonda Kida, MD as Consulting Physician (Cardiology) Solum, Betsey Holiday, MD as Physician Assistant (Endocrinology) Albertine Patricia, [place] as Attending Physician (Podiatry) Hessie Knows, MD as Consulting Physician (Orthopedic Surgery) Lequita Asal, MD as Referring Physician (Hematology and Oncology)    Assessment:   This is a routine wellness examination for [facility].  Exercise Activities and Dietary recommendations    Goals    . DIET - INCREASE WATER INTAKE     Recommend to drink at least 6-8 8oz glasses of water per day.       Fall Risk Fall Risk  01/04/2018 01/04/2018 12/06/2017 10/03/2017 09/03/2017  Falls in the past year? _0   Number falls in past yr: - - - - -  Injury with Fall? - - - - -  [place] for fall due to : History of fall(s);Impaired vision - - - -   Is the home free of loose throw rugs in walkways, pet beds, electrical cords, etc? Yes Adequate lighting to reduce risk of falls?  Yes In addition, does the patient have any of the following:  Stairs in or around the home WITH handrails? Yes Use of a cane, walker or w/c? No Grab bars in the bathroom? No  Shower chair or a place to sit while bathing? No Use of an  elevated toilet seat or a handicapped toilet? Yes  Timed Get Up and Go Performed: Yes. Pt ambulated 10 feet within 15 sec. Gait slow, steady and without the use of an assistive device. No intervention required at this time. Fall risk prevention has been discussed.  Pt declined my offer to send Community Resource Referral to Care Guide for installation of grab bars in the shower or a shower chair.  Depression Screen PHQ 2/9 Scores 01/04/2018 01/04/2018 09/03/2017 05/23/2017  PHQ - 2 Score 0 1 0 0  PHQ- 9 Score 3 3 - -     Cognitive Function     6CIT Screen 01/04/2018  What Year? 0 points  What month? 0 points  What time? 0 points  Count back from 20 0 points  Months in reverse 0 points  Repeat phrase 4 points  Total Score 4    Immunization History  Administered Date(s) Administered  . Influenza, High Dose Seasonal PF 05/23/2017  . Influenza,inj,Quad PF,6+ Mos 06/28/2015, 06/13/2016  . Influenza-Unspecified 06/28/2015, 06/13/2016  . Pneumococcal Conjugate-13 07/21/2014  . Pneumococcal Polysaccharide-23  06/02/2009, 11/10/2016  . Tdap 12/30/2009  . Zoster 07/18/2012    Qualifies for Shingles Vaccine? Yes. Zostavax completed 07/18/12. Due for Shingrix vaccine. Education has been provided regarding the importance of this vaccine. Pt has been advised to call her insurance company to determine her out of pocket expense. Advised she may also receive this vaccine at her local pharmacy or Health Dept. Verbalized acceptance and understanding.  Screening Tests Health Maintenance  Topic Date Due  . HEMOGLOBIN A1C  01/01/2018  . INFLUENZA VACCINE  04/04/2018  . MAMMOGRAM  04/17/2018  . FOOT EXAM  09/03/2018  . OPHTHALMOLOGY EXAM  09/10/2018  . COLONOSCOPY  01/21/2019  . TETANUS/TDAP  12/31/2019  . DEXA SCAN  Completed  . Hepatitis C Screening  Completed  . PNA vac Low Risk Adult  Completed    Cancer Screenings: Lung: Low Dose CT Chest recommended if Age 69-80 years, 30 pack-year  currently smoking OR have quit w/in 15years. Patient does qualify. An Epic message has been sent to Burgess Estelle, RN (Oncology Nurse Navigator) regarding the possible need for this exam. Raquel Sarna will review the patient's chart to determine if the patient truly qualifies for the exam. If the patient qualifies, Raquel Sarna will order the Low Dose CT of the chest to facilitate the scheduling of this exam. Breast:  Up to date on Mammogram? Yes. Completed 04/17/17. Repeat every year  Up to date of Bone Density/Dexa? Yes. Completed 04/17/17. Osteoporotic screenings no longer required Colorectal: Completed 01/20/14. Repeat every 5 years  Additional Screenings: Hepatitis C Screening: Completed 01/20/13    Plan:  I have personally reviewed and addressed the Medicare Annual Wellness questionnaire and have noted the following in the patient's chart:  A. Medical and social history B. Use of alcohol, tobacco or illicit drugs  C. Current medications and supplements D. Functional ability and status E.  Nutritional status F.  Physical activity G. Advance directives H. List of other physicians I.  Hospitalizations, surgeries, and ER visits in previous 12 months J.  [place] such as hearing and vision if needed, cognitive and depression L. Referrals and appointments  In addition, I have reviewed and discussed with patient certain preventive protocols, quality metrics, and best practice recommendations. A written personalized care plan for preventive services as well as general preventive health recommendations were provided to patient.  See attached scanned questionnaire for additional information.   Signed,  Aleatha Borer, LPN Nurse Health Advisor  I have reviewed this encounter including the documentation in this note and/or discussed this patient with the provider, Aleatha Borer, LPN. I am certifying that I agree with the content of this note as supervising physician.  Steele Sizer,  MD [facility] 01/04/2018, 1:09 PM

## 2018-01-09 ENCOUNTER — Telehealth: Payer: Self-pay | Admitting: *Deleted

## 2018-01-09 DIAGNOSIS — Z96651 Presence of right artificial knee joint: Secondary | ICD-10-CM | POA: Diagnosis not present

## 2018-01-09 DIAGNOSIS — M7051 Other bursitis of knee, right knee: Secondary | ICD-10-CM | POA: Diagnosis not present

## 2018-01-09 NOTE — Telephone Encounter (Signed)
Received referral for low dose lung cancer screening CT scan. Message left at phone number listed in EMR for patient to call me back to facilitate scheduling scan.  

## 2018-01-10 ENCOUNTER — Telehealth: Payer: Self-pay | Admitting: Family Medicine

## 2018-01-10 NOTE — Telephone Encounter (Signed)
Copied from [place] [id number]. Topic: Quick Communication - See Telephone Encounter >> Jan 10, 2018  4:41 PM Ivar Drape wrote: CRM for notification. See Telephone encounter for: 01/10/18. Patient stated that she needs orders to have labs drawn for diabetes for Dr. Ancil Boozer because her diabetic doctor said she is not due for labs with her until August. Please advise.

## 2018-01-10 NOTE — Telephone Encounter (Signed)
Patient notified

## 2018-01-10 NOTE — Telephone Encounter (Signed)
Dr. Gabriel Carina is only checking every 6 months, since her glucose was at goal on her last visit, it is okay to wait.

## 2018-01-23 ENCOUNTER — Telehealth: Payer: Self-pay | Admitting: *Deleted

## 2018-01-23 DIAGNOSIS — Z122 Encounter for screening for malignant neoplasm of respiratory organs: Secondary | ICD-10-CM

## 2018-01-23 DIAGNOSIS — Z87891 Personal history of nicotine dependence: Secondary | ICD-10-CM

## 2018-01-23 NOTE — Telephone Encounter (Signed)
Received referral for initial lung cancer screening scan. Contacted patient and obtained smoking history,(former, quit 2014, 40 pack year) as well as answering questions related to screening process. Patient denies signs of lung cancer such as weight loss or hemoptysis. Patient denies comorbidity that would prevent curative treatment if lung cancer were found. Patient is scheduled for shared decision making visit and CT scan on 02/13/18.

## 2018-01-24 DIAGNOSIS — M7050 Other bursitis of knee, unspecified knee: Secondary | ICD-10-CM | POA: Diagnosis not present

## 2018-01-24 DIAGNOSIS — Z96651 Presence of right artificial knee joint: Secondary | ICD-10-CM | POA: Diagnosis not present

## 2018-02-06 DIAGNOSIS — E1142 Type 2 diabetes mellitus with diabetic polyneuropathy: Secondary | ICD-10-CM | POA: Diagnosis not present

## 2018-02-06 DIAGNOSIS — B351 Tinea unguium: Secondary | ICD-10-CM | POA: Diagnosis not present

## 2018-02-06 DIAGNOSIS — L851 Acquired keratosis [keratoderma] palmaris et plantaris: Secondary | ICD-10-CM | POA: Diagnosis not present

## 2018-02-13 ENCOUNTER — Ambulatory Visit
Admission: RE | Admit: 2018-02-13 | Discharge: 2018-02-13 | Disposition: A | Discharge status: Home / Self Care | Payer: Medicare HMO | Source: Ambulatory Visit | Attending: Oncology | Admitting: Oncology

## 2018-02-13 ENCOUNTER — Inpatient Hospital Stay: Payer: Medicare HMO | Attending: Oncology | Admitting: Oncology

## 2018-02-13 DIAGNOSIS — I700 Atherosclerosis of aorta: Secondary | ICD-10-CM | POA: Insufficient documentation

## 2018-02-13 DIAGNOSIS — I2510 Atherosclerotic heart disease of native coronary artery without angina pectoris: Secondary | ICD-10-CM | POA: Diagnosis not present

## 2018-02-13 DIAGNOSIS — J439 Emphysema, unspecified: Secondary | ICD-10-CM | POA: Insufficient documentation

## 2018-02-13 DIAGNOSIS — Z87891 Personal history of nicotine dependence: Secondary | ICD-10-CM

## 2018-02-13 DIAGNOSIS — Z122 Encounter for screening for malignant neoplasm of respiratory organs: Secondary | ICD-10-CM | POA: Diagnosis not present

## 2018-02-13 NOTE — Progress Notes (Signed)
In accordance with CMS guidelines, patient has met eligibility criteria including age, absence of signs or symptoms of lung cancer.  Social History   Tobacco Use  . Smoking status: Former Smoker    Packs/day: 1.00    Years: 40.00    Pack years: 40.00    Types: Cigarettes    Start date: 09/04/1972    Last attempt to quit: 12/30/2012    Years since quitting: 5.1  . Smokeless tobacco: Never Used  . Tobacco comment: smoking cessation materials not required  Substance Use Topics  . Alcohol use: No    Alcohol/week: 0.0 oz  . Drug use: No     A shared decision-making session was conducted prior to the performance of CT scan. This includes one or more decision aids, includes benefits and harms of screening, follow-up diagnostic testing, over-diagnosis, false positive rate, and total radiation exposure.  Counseling on the importance of adherence to annual lung cancer LDCT screening, impact of co-morbidities, and ability or willingness to undergo diagnosis and treatment is imperative for compliance of the program.  Counseling on the importance of continued smoking cessation for former smokers; the importance of smoking cessation for current smokers, and information about tobacco cessation interventions have been given to patient including [place] and 1800 quit [place] programs.  Written order for lung cancer screening with LDCT has been given to the patient and any and all questions have been answered to the best of my abilities.   Yearly follow up will be coordinated by Burgess Estelle, Thoracic Navigator.  Faythe Casa, NP 02/13/2018 9:44 AM

## 2018-02-18 ENCOUNTER — Encounter: Payer: Self-pay | Admitting: *Deleted

## 2018-03-06 ENCOUNTER — Other Ambulatory Visit: Payer: Self-pay | Admitting: Family Medicine

## 2018-03-11 ENCOUNTER — Other Ambulatory Visit: Payer: Self-pay

## 2018-03-11 DIAGNOSIS — I10 Essential (primary) hypertension: Secondary | ICD-10-CM

## 2018-03-11 MED ORDER — LISINOPRIL 40 MG PO TABS: 40.0000 mg | ORAL_TABLET | Freq: Every day | ORAL | 0 refills | Status: DC

## 2018-03-11 NOTE — Telephone Encounter (Signed)
Refill request was sent to Dr. Krichna Sowles for approval and submission.  

## 2018-03-14 ENCOUNTER — Emergency Department: Payer: Medicare HMO

## 2018-03-14 ENCOUNTER — Other Ambulatory Visit: Payer: Self-pay

## 2018-03-14 ENCOUNTER — Emergency Department
Admission: EM | Admit: 2018-03-14 | Discharge: 2018-03-14 | Disposition: A | Discharge status: Home / Self Care | Payer: Medicare HMO | Attending: Emergency Medicine | Admitting: Emergency Medicine

## 2018-03-14 ENCOUNTER — Encounter: Payer: Self-pay | Admitting: Emergency Medicine

## 2018-03-14 DIAGNOSIS — I129 Hypertensive chronic kidney disease with stage 1 through stage 4 chronic kidney disease, or unspecified chronic kidney disease: Secondary | ICD-10-CM | POA: Diagnosis not present

## 2018-03-14 DIAGNOSIS — E1122 Type 2 diabetes mellitus with diabetic chronic kidney disease: Secondary | ICD-10-CM | POA: Diagnosis not present

## 2018-03-14 DIAGNOSIS — J449 Chronic obstructive pulmonary disease, unspecified: Secondary | ICD-10-CM | POA: Diagnosis not present

## 2018-03-14 DIAGNOSIS — E1140 Type 2 diabetes mellitus with diabetic neuropathy, unspecified: Secondary | ICD-10-CM | POA: Insufficient documentation

## 2018-03-14 DIAGNOSIS — N183 Chronic kidney disease, stage 3 (moderate): Secondary | ICD-10-CM | POA: Diagnosis not present

## 2018-03-14 DIAGNOSIS — Z79899 Other long term (current) drug therapy: Secondary | ICD-10-CM | POA: Insufficient documentation

## 2018-03-14 DIAGNOSIS — I2510 Atherosclerotic heart disease of native coronary artery without angina pectoris: Secondary | ICD-10-CM | POA: Insufficient documentation

## 2018-03-14 DIAGNOSIS — Z7982 Long term (current) use of aspirin: Secondary | ICD-10-CM | POA: Insufficient documentation

## 2018-03-14 DIAGNOSIS — Z96651 Presence of right artificial knee joint: Secondary | ICD-10-CM | POA: Insufficient documentation

## 2018-03-14 DIAGNOSIS — M5441 Lumbago with sciatica, right side: Secondary | ICD-10-CM | POA: Diagnosis not present

## 2018-03-14 DIAGNOSIS — M545 Low back pain: Secondary | ICD-10-CM | POA: Diagnosis not present

## 2018-03-14 DIAGNOSIS — M5430 Sciatica, unspecified side: Secondary | ICD-10-CM | POA: Diagnosis not present

## 2018-03-14 DIAGNOSIS — Z87891 Personal history of nicotine dependence: Secondary | ICD-10-CM | POA: Insufficient documentation

## 2018-03-14 DIAGNOSIS — Z794 Long term (current) use of insulin: Secondary | ICD-10-CM | POA: Insufficient documentation

## 2018-03-14 LAB — URINALYSIS, COMPLETE (UACMP) WITH MICROSCOPIC
BACTERIA UA: NONE SEEN
BILIRUBIN URINE: NEGATIVE
Glucose, UA: >=500 mg/dL — AB
Hgb urine dipstick: NEGATIVE
KETONES UR: 5 mg/dL — AB
LEUKOCYTES UA: NEGATIVE
NITRITE: NEGATIVE
PH: 7 (ref 5.0–8.0)
PROTEIN: 100 mg/dL — AB
Specific Gravity, Urine: 1.015 (ref 1.005–1.030)

## 2018-03-14 MED ORDER — MORPHINE SULFATE (PF) 2 MG/ML IV SOLN
INTRAVENOUS | Status: AC
  Filled 2018-03-14: qty 1

## 2018-03-14 MED ORDER — MORPHINE SULFATE (PF) 2 MG/ML IV SOLN
INTRAVENOUS | Status: AC
  Administered 2018-03-14: 2 mg via INTRAVENOUS
  Filled 2018-03-14: qty 1

## 2018-03-14 MED ORDER — MORPHINE SULFATE (PF) 2 MG/ML IV SOLN
2.0000 mg | Freq: Once | INTRAVENOUS | Status: AC
  Administered 2018-03-14: 2 mg via INTRAVENOUS

## 2018-03-14 MED ORDER — ONDANSETRON HCL 4 MG/2ML IJ SOLN
4.0000 mg | Freq: Once | INTRAMUSCULAR | Status: AC
  Administered 2018-03-14: 4 mg via INTRAVENOUS

## 2018-03-14 MED ORDER — ONDANSETRON HCL 4 MG/2ML IJ SOLN
INTRAMUSCULAR | Status: AC
Start: 2018-03-14 — End: 2018-03-14
  Administered 2018-03-14: 4 mg via INTRAVENOUS
  Filled 2018-03-14: qty 2

## 2018-03-14 MED ORDER — TRAMADOL HCL 50 MG PO TABS: 50.0000 mg | ORAL_TABLET | Freq: Four times a day (QID) | ORAL | 0 refills | Status: DC | PRN

## 2018-03-14 NOTE — ED Triage Notes (Addendum)
Patient ambulatory to triage with steady gait, without difficulty, holding lower back, appears uncomfortable, grimacing; pt reports lower back pain radiating down right leg tonight; st her husband had surgery and has been sitting in hosp with him all day

## 2018-03-14 NOTE — ED Notes (Signed)
Patient transported to MRI 

## 2018-03-14 NOTE — ED Provider Notes (Signed)
[facility] Emergency Department Provider Note ____   First MD Initiated Contact with Patient 03/14/18 [phone]     (approximate)  I have reviewed the triage vital signs and the nursing notes.   HISTORY  Chief Complaint Back Pain    HPI Dana Sparks is a 67 y.o. female with below list of chronic medical conditions presents to the emergency department with 1 day history of low back pain with radiation down the posterior right buttocks and leg.  Patient's current pain score is 10 out of 10.   Past Medical History:  Diagnosis Date  . Allergy   . Anxiety   . Arthritis   . CAD (coronary artery disease)   . COPD (chronic obstructive pulmonary disease) ([place])   . Depression   . Diabetes mellitus without complication ([place])   . Diverticulitis   . Gastroparesis   . Gout   . Hyperlipemia   . Hypertension   . Positive H. pylori test   . Renal insufficiency   . Sleep apnea   . Thrombocytosis ([place]) 01/28/2015  . Tubular adenoma of colon 01/20/14    Patient Active Problem List   Diagnosis Date Noted  . Type II or unspecified type diabetes mellitus with neurological manifestations, not stated as uncontrolled(250.60) 09/03/2017  . Nasal polyp 09/03/2017  . Lipoma of back 09/03/2017  . Claudication ([place]) 06/04/2017  . LVH (left ventricular hypertrophy) 02/27/2017  . Decreased cardiac ejection fraction 02/27/2017  . Left hand weakness 10/12/2016  . Elevated antinuclear antibody (ANA) level 04/27/2016  . Angina pectoris ([place]) 11/01/2015  . Well controlled type 2 diabetes mellitus with gastroparesis ([place]) 06/22/2015  . Low TSH level 06/22/2015  . Iron deficiency anemia 04/07/2015  . Intestinal metaplasia of gastric mucosa 03/11/2015  . CAD (coronary artery disease), native coronary artery 02/24/2015  . History of coronary artery stent placement 02/24/2015  . Chronic kidney disease, stage 3, mod decreased GFR ([facility]) 02/24/2015  . Menopause 02/24/2015  . History  of Helicobacter pylori infection 02/24/2015  . Mild mitral insufficiency 02/24/2015  . Mild tricuspid insufficiency 02/24/2015  . 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 ([place]) 02/24/2015  . Controlled gout 02/24/2015  . Depression, major, recurrent, mild ([place]) 02/24/2015  . Morbid obesity due to excess calories ([place]) 02/24/2015  . Mild pulmonary hypertension ([place]) 02/24/2015  . Gastroesophageal reflux disease without esophagitis 02/24/2015  . Perennial allergic rhinitis 02/24/2015  . HLD (hyperlipidemia) 02/20/2015  . Hypertension, benign 02/20/2015  . Apnea, sleep 02/20/2015  . Controlled diabetes mellitus with stage 3 chronic kidney disease, without long-term current use of insulin ([place]) 02/20/2015    Past Surgical History:  Procedure Laterality Date  . ABDOMINAL HYSTERECTOMY     total  . CATARACT EXTRACTION    . COLONOSCOPY  01/2014   sigmoid diverticulosis. desc colon TA, hyperplastic polyp  . CORONARY ANGIOPLASTY WITH STENT PLACEMENT    . ESOPHAGOGASTRODUODENOSCOPY N/A 02/16/2015   negative h pylori, focal gastic intestinal metaplasia  . TOTAL KNEE ARTHROPLASTY Right     Prior to Admission medications   Medication Sig Start Date End Date Taking? Authorizing Provider  ACCU-CHEK FASTCLIX LANCETS [place]  06/06/16   [provider]  ACCU-CHEK SMARTVIEW test strip Check fsbs two times daily  DMII 05/18/17   Ancil Boozer, Drue Stager, MD  albuterol (PROVENTIL) (2.5 MG/3ML) 0.083% nebulizer solution Take 3 mLs (2.5 mg total) by nebulization every 6 (six) hours as needed for wheezing or shortness of breath. 11/01/17   Uvaldo Rising,  Astrid Divine, FNP  Alcohol Swabs (B-D SINGLE USE SWABS REGULAR) PADS 1 each by Does not apply route 4 (four) times daily. 06/11/16   Steele Sizer, MD  allopurinol (ZYLOPRIM) 100 MG tablet Take 1 tablet (100 mg total) by mouth daily. 10/03/17   Steele Sizer, MD  aspirin EC 81 MG tablet Take 81 mg by mouth every morning.     [provider]  azithromycin (ZITHROMAX) 250 MG tablet Take 2 on day the first day and one for the next three days 03/15/18   Fredderick Severance, NP  BD INSULIN SYRINGE U/F 31G X 5/16" 0.3 ML MISC  12/12/17   [provider]  benzonatate (TESSALON) 100 MG capsule Take 1 capsule (100 mg total) by mouth 2 (two) times daily as needed for cough. 03/15/18   Poulose, Bethel Born, NP  blood glucose meter kit and supplies KIT Dispense based on patient and insurance preference. Use up to four times daily as directed. (FOR ICD-9 250.00, 250.01). 05/02/16   Gladstone Lighter, MD  clindamycin (CLEOCIN) 150 MG capsule Take 150 mg by mouth 4 (four) times daily. Take 4 tablets at once one hour before dental appointment.    [provider]  Coenzyme Q10 100 MG capsule TAKE 1 CAPSULE EVERY DAY 10/05/17   Ancil Boozer, Drue Stager, MD  Ferrous Sulfate (IRON) 325 (65 Fe) MG TABS Take 325 mg by mouth 2 (two) times daily. Nature made brand-  Pt takes one tablet twice a day     [provider]  fluticasone (FLONASE) 50 MCG/ACT nasal spray Place 2 sprays into both nostrils daily. 08/20/17   Steele Sizer, MD  glipiZIDE (GLUCOTROL XL) 10 MG 24 hr tablet TAKE 1 TABLET EVERY DAY WITH BREAKFAST 01/04/17   Sowles, Drue Stager, MD  glucose blood test strip Use 2 (two) times daily. 05/17/17 05/17/18  [provider]  insulin NPH Human (HUMULIN N,NOVOLIN N) 100 UNIT/ML injection Inject 15-20 Units into the skin 2 (two) times daily before a meal. 15 units in the morning and 20 units at bedtime    Solum, Betsey Holiday, MD  ipratropium (ATROVENT) 0.03 % nasal spray Place 2 sprays into both nostrils every 12 (twelve) hours. 03/15/18   Poulose, Bethel Born, NP  isosorbide mononitrate (IMDUR) 60 MG 24 hr tablet Take 1 tablet (60 mg total) by mouth daily. 01/04/18   Steele Sizer, MD  LANCETS MICRO THIN 33G MISC Use 1 each 2 (two) times daily. accu chek. Diagnosis: E11.22 04/02/17 04/02/18  [provider]  lisinopril  (PRINIVIL,ZESTRIL) 40 MG tablet Take 1 tablet (40 mg total) by mouth daily. 03/11/18   Steele Sizer, MD  loratadine (CLARITIN) 10 MG tablet Take 1 tablet (10 mg total) by mouth daily. 03/15/18   Poulose, Bethel Born, NP  magic mouthwash w/lidocaine SOLN Take 5 mLs by mouth 2 (two) times daily. 03/15/18   Poulose, Bethel Born, NP  meloxicam (MOBIC) 7.5 MG tablet Take by mouth. 02/20/17   [provider]  metoprolol succinate (TOPROL-XL) 50 MG 24 hr tablet Take 1 tablet (50 mg total) by mouth daily. 10/03/17   Steele Sizer, MD  montelukast (SINGULAIR) 10 MG tablet Take 1 tablet by mouth every evening. 09/28/17   Carloyn Manner, MD  pantoprazole (PROTONIX) 40 MG tablet Take 1 tablet (40 mg total) by mouth every morning. 01/04/18   Steele Sizer, MD  rosuvastatin (CRESTOR) 20 MG tablet Take 1 tablet (20 mg total) by mouth daily. 01/04/18   Steele Sizer, MD  traMADol Veatrice Bourbon)  50 MG tablet Take 1 tablet (50 mg total) by mouth every 6 (six) hours as needed. 03/14/18 03/14/19  Gregor Hams, MD  venlafaxine XR (EFFEXOR-XR) 150 MG 24 hr capsule Take 1 capsule (150 mg total) by mouth daily with breakfast. Daily 01/04/18   Steele Sizer, MD    Allergies Codeine; Contrast media [iodinated diagnostic agents]; and Sulfa antibiotics  Family History  Problem Relation Age of Onset  . Stroke Sister   . Hyperlipidemia Sister   . Alcohol abuse Brother   . Diabetes Brother   . Stroke Brother   . Hypertension Mother   . Diabetes Mother   . Heart disease Mother   . Diabetes Father   . Heart disease Father   . Hypertension Father   . Hypertension Sister   . Cancer Maternal Grandmother        Unsure  . Diabetes Maternal Grandfather   . Colon cancer Neg Hx   . Liver disease Neg Hx   . Breast cancer Neg Hx     Social History Social History   Tobacco Use  . Smoking status: Former Smoker    Packs/day: 1.00    Years: 40.00    Pack years: 40.00    Types: Cigarettes    Start date:  09/04/1972    Last attempt to quit: 12/30/2012    Years since quitting: 5.2  . Smokeless tobacco: Never Used  . Tobacco comment: smoking cessation materials not required  Substance Use Topics  . Alcohol use: No    Alcohol/week: 0.0 oz  . Drug use: No    Review of Systems Constitutional: No fever/chills Eyes: No visual changes. ENT: No sore throat. Cardiovascular: Denies chest pain. Respiratory: Denies shortness of breath. Gastrointestinal: No abdominal pain.  No nausea, no vomiting.  No diarrhea.  No constipation. Genitourinary: Negative for dysuria. Musculoskeletal: Negative for neck pain.  Positive for back pain  integumentary: Negative for rash. Neurological: Negative for headaches, focal weakness or numbness.  ____________________________________________   PHYSICAL EXAM:  VITAL SIGNS: ED Triage Vitals  Enc Vitals Group     BP 03/14/18 0256 (!) 180/84     Pulse Rate 03/14/18 0256 82     Resp 03/14/18 0256 18     Temp 03/14/18 0256 97.6 F (36.4 C)     Temp Source 03/14/18 0256 Oral     SpO2 03/14/18 0256 98 %     Weight 03/14/18 0255 104.3 kg (230 lb)     Height 03/14/18 0255 1.651 m (_0 )     Head Circumference --      Peak Flow --      Pain Score 03/14/18 0254 8     Pain Loc --      Pain Edu? --      Excl. in [place]? --     Constitutional: Alert and oriented.  Apparent discomfort eyes: Conjunctivae are normal.  Head: Atraumatic. Mouth/Throat: Mucous membranes are moist.  Oropharynx non-erythematous. Neck: No stridor. No cervical spine tenderness to palpation. Cardiovascular: Normal rate, regular rhythm. Good peripheral circulation. Grossly normal heart sounds. Respiratory: Normal respiratory effort.  No retractions. Lungs CTAB. Gastrointestinal: Soft and nontender. No distention.  Musculoskeletal: No lower extremity tenderness nor edema. No gross deformities of extremities.  Pain with lumbar spine and paraspinal muscle palpation. Neurologic:  Normal speech and  language. No gross focal neurologic deficits are appreciated.  Skin:  Skin is warm, dry and intact. No rash noted. Psychiatric: Mood and affect are normal. Speech and behavior  are normal.  ____________________________________________   LABS (all labs ordered are listed, but only abnormal results are displayed)  Labs Reviewed  URINALYSIS, COMPLETE (UACMP) WITH MICROSCOPIC - Abnormal; Notable for the following components:      Result Value   Color, Urine STRAW (*)    APPearance CLEAR (*)    Glucose, UA >=500 (*)    Ketones, ur 5 (*)    Protein, ur 100 (*)    All other components within normal limits   ___________________________________________  RADIOLOGY I, [place] N BROWN, personally viewed and evaluated these images (plain radiographs) as part of my medical decision making, as well as reviewing the written report by the radiologist.  ED MD interpretation: MRI of the lumbar spine revealed L4-L5 small broad-based disc bulge.  Severe arthropathy per radiologist  CLINICAL DATA:  Low back pain radiating to RIGHT lower extremity. No injury.  EXAM: MRI LUMBAR SPINE WITHOUT CONTRAST  TECHNIQUE: Multiplanar, multisequence MR imaging of the lumbar spine was performed. No intravenous contrast was administered.  COMPARISON:  None.  FINDINGS: SEGMENTATION: For the purposes of this report, the last well-formed intervertebral disc is reported as L5-S1.  ALIGNMENT: Maintained lumbar lordosis. No malalignment.  VERTEBRAE:Vertebral bodies are intact. Mild L3-4 disc height loss. Diffuse mild disc desiccation with multilevel mild chronic discogenic endplate changes. Mild bright STIR signal LEFT L5-S1 facet associated with arthropathy.  CONUS MEDULLARIS AND CAUDA EQUINA: Conus medullaris terminates at L1-2 and demonstrates normal morphology and signal characteristics. Cauda equina is normal.  PARASPINAL AND OTHER SOFT TISSUES: Nonacute. Bilateral T2 bright renal cysts  measuring to 2.8 cm on the LEFT.  DISC LEVELS:  L1-2: No disc bulge, canal stenosis nor neural foraminal narrowing.  L2-3 no 3 4: Small broad-based disc bulges. Mild facet arthropathy without canal stenosis or neural foraminal narrowing.  L4-5: Small broad-based disc bulge asymmetric to the LEFT. Moderate facet arthropathy and ligamentum flavum redundancy. No canal stenosis. Mild neural foraminal narrowing.  L5-S1: No disc bulge, canal stenosis nor neural foraminal narrowing. Severe facet arthropathy.  IMPRESSION: 1. No fracture or malalignment. Acute reactive changes LEFT L5-S1 associated with severe facet arthropathy. 2. No canal stenosis.  Mild L4-5 neural foraminal narrowing.   Electronically Signed   By: Elon Alas M.D.   On: 03/14/2018 05:52 ____________________________________________    Procedures   ____________________________________________   INITIAL IMPRESSION / ASSESSMENT AND PLAN / ED COURSE  As part of my medical decision making, I reviewed the following data within the electronic MEDICAL RECORD NUMBER   67 year old female presenting with above-stated history and physical exam consistent with possible sciatica.  No clinical findings of cauda equina syndrome or epidural abscess.  MRI of the lumbar spine was performed which revealed severe facet arthropathy as well as a L4-L5 disc bulge.  Patient was given IV morphine in the emergency department with resolution of pain.  Patient was able to ambulate without difficulty.  Patient will be prescribed tramadol for home.  Spoke with the patient at length regarding need for outpatient follow-up with Dr. Cari Caraway or surgery.  Patient and family agreeable to discharge plan. ____________________________________________  FINAL CLINICAL IMPRESSION(S) / ED DIAGNOSES  Final diagnoses:  Sciatica, unspecified laterality     MEDICATIONS GIVEN DURING THIS VISIT:  Medications  morphine 2 MG/ML injection 2 mg  (2 mg Intravenous Given 03/14/18 0432)  ondansetron (ZOFRAN) injection 4 mg (4 mg Intravenous Given 03/14/18 0432)  morphine 2 MG/ML injection 2 mg (2 mg Intravenous Given 03/14/18 0603)     ED  Discharge Orders        Ordered    traMADol (ULTRAM) 50 MG tablet  Every 6 hours PRN     03/14/18 0655       Note:  This document was prepared using Dragon voice recognition software and may include unintentional dictation errors.    Gregor Hams, MD 03/15/18 [phone]

## 2018-03-14 NOTE — ED Notes (Signed)
Patient transported to MRI by this EDT. Patient returned to room 5 at 05:42.

## 2018-03-14 NOTE — ED Notes (Signed)
Pt Discharged and was pushed in [facility] to ICU with family members. VSS, NAD. Discharge instructions, RX and follow up discussed. All questions addressed.

## 2018-03-15 ENCOUNTER — Encounter: Payer: Self-pay | Admitting: Nurse Practitioner

## 2018-03-15 ENCOUNTER — Ambulatory Visit (INDEPENDENT_AMBULATORY_CARE_PROVIDER_SITE_OTHER): Payer: Medicare HMO | Admitting: Nurse Practitioner

## 2018-03-15 ENCOUNTER — Other Ambulatory Visit: Payer: Self-pay | Admitting: Family Medicine

## 2018-03-15 VITALS — BP 102/70 | HR 85 | Temp 97.5°F | Resp 16 | Ht 65.0 in | Wt 220.8 lb

## 2018-03-15 DIAGNOSIS — J0111 Acute recurrent frontal sinusitis: Secondary | ICD-10-CM

## 2018-03-15 DIAGNOSIS — R059 Cough, unspecified: Secondary | ICD-10-CM

## 2018-03-15 DIAGNOSIS — J029 Acute pharyngitis, unspecified: Secondary | ICD-10-CM | POA: Diagnosis not present

## 2018-03-15 DIAGNOSIS — M545 Low back pain: Secondary | ICD-10-CM | POA: Diagnosis not present

## 2018-03-15 DIAGNOSIS — M791 Myalgia, unspecified site: Secondary | ICD-10-CM

## 2018-03-15 DIAGNOSIS — R05 Cough: Secondary | ICD-10-CM

## 2018-03-15 MED ORDER — BENZONATATE 100 MG PO CAPS: 100.0000 mg | ORAL_CAPSULE | Freq: Two times a day (BID) | ORAL | 0 refills | Status: DC | PRN

## 2018-03-15 MED ORDER — IPRATROPIUM BROMIDE 0.03 % NA SOLN: 2.0000 | Freq: Two times a day (BID) | NASAL | 0 refills | Status: DC

## 2018-03-15 MED ORDER — MAGIC MOUTHWASH W/LIDOCAINE: 5.0000 mL | Freq: Two times a day (BID) | ORAL | 0 refills | Status: DC

## 2018-03-15 MED ORDER — LORATADINE 10 MG PO TABS: 10.0000 mg | ORAL_TABLET | Freq: Every day | ORAL | 3 refills | Status: DC

## 2018-03-15 MED ORDER — AZITHROMYCIN 250 MG PO TABS: ORAL_TABLET | ORAL | 0 refills | Status: DC

## 2018-03-15 NOTE — Progress Notes (Addendum)
Name: Dana Sparks   MRN: [id number]    DOB: 20-May-1951   Date:03/15/2018       Progress Note  Subjective  Chief Complaint  Chief Complaint  Patient presents with  . Sinusitis    HPI  Patient endorses sinus congestion- with yellow, green drainage, sore throat- and its itchy and productive cough about three day.  Has been taking musinex; cephecol cough drops. She has some benzonatate tabs for previous rx that she hasnt took. It taking flonase every day and Singulair is not taking claratin thought she was supposed to stop it when taking singulair    Was recently seen in ER for sciatic nerve pain yesterday- given tramadol states they told her they would give her 135m tabs and she got 516m  Patient Active Problem List   Diagnosis Date Noted  . Type II or unspecified type diabetes mellitus with neurological manifestations, not stated as uncontrolled(250.60) 09/03/2017  . Nasal polyp 09/03/2017  . Lipoma of back 09/03/2017  . Claudication (HCO[place]10/09/2016  . LVH (left ventricular hypertrophy) 02/27/2017  . Decreased cardiac ejection fraction 02/27/2017  . Left hand weakness 10/12/2016  . Elevated antinuclear antibody (ANA) level 04/27/2016  . Angina pectoris (HCCampbell02/27/2017  . Well controlled type 2 diabetes mellitus with gastroparesis (HCRock [place]10/18/2016  . Low TSH level 06/22/2015  . Iron deficiency anemia 04/07/2015  . Intestinal metaplasia of gastric mucosa 03/11/2015  . CAD (coronary artery disease), native coronary artery 02/24/2015  . History of coronary artery stent placement 02/24/2015  . Chronic kidney disease, stage 3, mod decreased GFR (HCC) 02/24/2015  . Menopause 02/24/2015  . History of Helicobacter pylori infection 02/24/2015  . Mild mitral insufficiency 02/24/2015  . Mild tricuspid insufficiency 02/24/2015  . 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 Controlled gout 02/24/2015  . Depression, major, recurrent, mild (HCR[place] 02/24/2015  . Morbid obesity due to excess calories (HCCarl[place]06/22/2016  . Mild pulmonary hypertension (HCMeridian [place]06/22/2016  . Gastroesophageal reflux disease without esophagitis 02/24/2015  . Perennial allergic rhinitis 02/24/2015  . HLD (hyperlipidemia) 02/20/2015  . Hypertension, benign 02/20/2015  . Apnea, sleep 02/20/2015  . Controlled diabetes mellitus with stage 3 chronic kidney disease, without long-term current use of insulin (HCHen[place]06/18/2016    Past Medical History:  Diagnosis Date  . Allergy   . Anxiety   . Arthritis   . CAD (coronary artery disease)   . COPD (chronic obstructive pulmonary disease) (HCFowler  . Depression   . Diabetes mellitus without complication (HCMascot  . Diverticulitis   . Gastroparesis   . Gout   . Hyperlipemia   . Hypertension   . Positive H. pylori test   . Renal insufficiency   . Sleep apnea   . Thrombocytosis (HCM[place]5/26/2016  . Tubular adenoma of colon 01/20/14    Past Surgical History:  Procedure Laterality Date  . ABDOMINAL HYSTERECTOMY     total  . CATARACT EXTRACTION    . COLONOSCOPY  01/2014   sigmoid diverticulosis. desc colon TA, hyperplastic polyp  . CORONARY ANGIOPLASTY WITH STENT PLACEMENT    . ESOPHAGOGASTRODUODENOSCOPY N/A 02/16/2015   negative h pylori, focal gastic intestinal metaplasia  . TOTAL KNEE ARTHROPLASTY Right     Social History   Tobacco Use  . Smoking status: Former Smoker    Packs/day: 1.00    Years: 40.00    Pack years: 40.00    Types: Cigarettes    Start date: 09/04/1972  Last attempt to quit: 12/30/2012    Years since quitting: 5.2  . Smokeless tobacco: Never Used  . Tobacco comment: smoking cessation materials not required  Substance Use Topics  . Alcohol use: No    Alcohol/week: 0.0 oz     Current Outpatient Medications:  .  ACCU-CHEK FASTCLIX LANCETS MISC, , Disp: , Rfl:  .  ACCU-CHEK SMARTVIEW test strip, Check fsbs two times daily  DMII, Disp: 100 each, Rfl: 6 .  albuterol (PROVENTIL) (2.5  MG/3ML) 0.083% nebulizer solution, Take 3 mLs (2.5 mg total) by nebulization every 6 (six) hours as needed for wheezing or shortness of breath., Disp: 75 mL, Rfl: 0 .  Alcohol Swabs (B-D SINGLE USE SWABS REGULAR) PADS, 1 each by Does not apply route 4 (four) times daily., Disp: 200 each, Rfl: 2 .  allopurinol (ZYLOPRIM) 100 MG tablet, Take 1 tablet (100 mg total) by mouth daily., Disp: 90 tablet, Rfl: 3 .  aspirin EC 81 MG tablet, Take 81 mg by mouth every morning. , Disp: , Rfl:  .  BD INSULIN SYRINGE U/F 31G X 5/16" 0.3 ML MISC, , Disp: , Rfl:  .  blood glucose meter kit and supplies KIT, Dispense based on patient and insurance preference. Use up to four times daily as directed. (FOR ICD-9 250.00, 250.01)., Disp: 1 each, Rfl: 0 .  clindamycin (CLEOCIN) 150 MG capsule, Take 150 mg by mouth 4 (four) times daily. Take 4 tablets at once one hour before dental appointment., Disp: , Rfl:  .  Coenzyme Q10 100 MG capsule, TAKE 1 CAPSULE EVERY DAY, Disp: 90 capsule, Rfl: 1 .  Ferrous Sulfate (IRON) 325 (65 Fe) MG TABS, Take 325 mg by mouth 2 (two) times daily. Nature made brand-  Pt takes one tablet twice a day , Disp: , Rfl:  .  fluticasone (FLONASE) 50 MCG/ACT nasal spray, Place 2 sprays into both nostrils daily., Disp: 48 g, Rfl: 2 .  glipiZIDE (GLUCOTROL XL) 10 MG 24 hr tablet, TAKE 1 TABLET EVERY DAY WITH BREAKFAST, Disp: 90 tablet, Rfl: 1 .  glucose blood test strip, Use 2 (two) times daily., Disp: , Rfl:  .  insulin NPH Human (HUMULIN N,NOVOLIN N) 100 UNIT/ML injection, Inject 15-20 Units into the skin 2 (two) times daily before a meal. 15 units in the morning and 20 units at bedtime, Disp: , Rfl:  .  isosorbide mononitrate (IMDUR) 60 MG 24 hr tablet, Take 1 tablet (60 mg total) by mouth daily., Disp: 90 tablet, Rfl: 1 .  LANCETS MICRO THIN 33G MISC, Use 1 each 2 (two) times daily. accu chek. Diagnosis: E11.22, Disp: , Rfl:  .  lisinopril (PRINIVIL,ZESTRIL) 40 MG tablet, Take 1 tablet (40 mg total) by  mouth daily., Disp: 90 tablet, Rfl: 0 .  loratadine (CLARITIN) 10 MG tablet, Take by mouth., Disp: , Rfl:  .  meloxicam (MOBIC) 7.5 MG tablet, Take by mouth., Disp: , Rfl:  .  metoprolol succinate (TOPROL-XL) 50 MG 24 hr tablet, Take 1 tablet (50 mg total) by mouth daily., Disp: 90 tablet, Rfl: 3 .  montelukast (SINGULAIR) 10 MG tablet, Take 1 tablet by mouth every evening., Disp: , Rfl:  .  pantoprazole (PROTONIX) 40 MG tablet, Take 1 tablet (40 mg total) by mouth every morning., Disp: 90 tablet, Rfl: 1 .  rosuvastatin (CRESTOR) 20 MG tablet, Take 1 tablet (20 mg total) by mouth daily., Disp: 90 tablet, Rfl: 1 .  traMADol (ULTRAM) 50 MG tablet, Take 1 tablet (50 mg  total) by mouth every 6 (six) hours as needed., Disp: 20 tablet, Rfl: 0 .  venlafaxine XR (EFFEXOR-XR) 150 MG 24 hr capsule, Take 1 capsule (150 mg total) by mouth daily with breakfast. Daily, Disp: 90 capsule, Rfl: 1  Allergies  Allergen Reactions  . Codeine Other (See Comments)    Unknown reaction  . Contrast Media [Iodinated Diagnostic Agents] Itching  . Sulfa Antibiotics Itching    ROS   No other specific complaints in a complete review of systems (except as listed in HPI above).  Objective  Vitals:   03/15/18 1348 03/15/18 1349  BP:  102/70  Pulse:  85  Resp:  16  Temp:  (!) 97.5 F (36.4 C)  TempSrc:  Oral  SpO2:  96%  Weight:  220 lb 12.8 oz (100.2 kg)  Height: '5\' 5"'  (1.651 m) '5\' 5"'  (1.651 m)    Body mass index is 36.74 kg/m.  Nursing Note and Vital Signs reviewed.  Physical Exam   Constitutional: Patient appears well-developed and well-nourished. Obese No distress.  HEENT: head atraumatic, normocephalic, pupils equal and reactive to light, TM's without erythema or bulging, no maxillary tenderness, mild frontal sinus tenderness , neck supple without lymphadenopathy, oropharynx pink and moist without exudate, dried yellow nasal discharge Cardiovascular: Normal rate, regular rhythm, S1/S2 present.     Pulmonary/Chest: Effort normal and breath sounds clear.  MSK: lower lumbar pain Psychiatric: Patient has a normal mood and affect. behavior is normal. Judgment and thought content normal.  Results for orders placed or performed during the hospital encounter of 03/14/18 (from the past 72 hour(s))  Urinalysis, Complete w Microscopic     Status: Abnormal   Collection Time: 03/14/18  3:00 AM  Result Value Ref Range   Color, Urine STRAW (A) YELLOW   APPearance CLEAR (A) CLEAR   Specific Gravity, Urine 1.015 1.005 - 1.030   pH 7.0 5.0 - 8.0   Glucose, UA >=500 (A) NEGATIVE mg/dL   Hgb urine dipstick NEGATIVE NEGATIVE   Bilirubin Urine NEGATIVE NEGATIVE   Ketones, ur 5 (A) NEGATIVE mg/dL   Protein, ur 100 (A) NEGATIVE mg/dL   Nitrite NEGATIVE NEGATIVE   Leukocytes, UA NEGATIVE NEGATIVE   RBC / HPF 0-5 0 - 5 RBC/hpf   WBC, UA 0-5 0 - 5 WBC/hpf   Bacteria, UA NONE SEEN NONE SEEN   Squamous Epithelial / LPF 0-5 0 - 5    Comment: Performed at [facility], [address], [place], [place] [address]    Assessment & Plan 1. Acute recurrent frontal sinusitis Delayed antibiotic prescribing cannot fill till Monday  - loratadine (CLARITIN) 10 MG tablet; Take 1 tablet (10 mg total) by mouth daily.  Dispense: 30 tablet; Refill: 3 - ipratropium (ATROVENT) 0.03 % nasal spray; Place 2 sprays into both nostrils every 12 (twelve) hours.  Dispense: 1 mL; Refill: 0 - azithromycin (ZITHROMAX) 250 MG tablet; Take 2 on day the first day and one for the next three days  Dispense: 6 tablet; Refill: 0  2. Cough Delayed antibiotic prescribing cannot fill till Monday  - azithromycin (ZITHROMAX) 250 MG tablet; Take 2 on day the first day and one for the next three days  Dispense: 6 tablet; Refill: 0 - benzonatate (TESSALON) 100 MG capsule; Take 1 capsule (100 mg total) by mouth 2 (two) times daily as needed for cough.  Dispense: 20 capsule; Refill: 0  3. Sore throat  - magic mouthwash w/lidocaine  SOLN; Take 5 mLs by mouth 2 (two)  times daily.  Dispense: 20 mL; Refill: 0  4. Acute bilateral low back pain, with sciatica presence unspecified -continue with ED medication management discussed ice and heat and back exercises   Face-to-face time with patient was more than 25 minutes, >50% time spent counseling and coordination of care- extensive time spent educating patient due to numerous questions. -Red flags and when to present for emergency care or RTC including fever >101.43F, chest pain, shortness of breath, new/worsening/un-resolving symptoms,  reviewed with patient at time of visit. Follow up and care instructions discussed and provided in AVS. -Reviewed Health Maintenance:   ------------------------------------------- I have reviewed this encounter including the documentation in this note and/or discussed this patient with the provider, Suezanne Cheshire DNP AGNP-C. I am certifying that I agree with the content of this note as supervising physician. Enid Derry, [facility] 03/18/2018, 5:36 PM

## 2018-03-15 NOTE — Patient Instructions (Addendum)
- If symptoms have not improved with musinex, loratadine, magic mouth wash, and tessalon perls You can pick up antibiotics on 03/18/2018 from pharmacy    You likely have a viral upper respiratory infection (URI). Antibiotics will not reduce the number of days you are ill or prevent you from getting bacterial rhinosinusitis. A URI can take up to 14 days to resolve, but typically last between 7-11 days. Your body is so smart and strong that it will be fighting this illness off for you but it is important that you drink plenty of fluids, rest. Cover your nose/mouth when you cough or sneeze and wash your hands well and often. Here are some helpful things you can use or pick up over the counter from the pharmacy to help with your symptoms:   For Fever/Pain: Acetaminophen every 6 hours as needed (maximum of 3000mg  a day). If you are still uncomfortable you can add ibuprofen OR naproxen  For coughing: try dextromethorphan for a cough suppressant, and/or a cool mist humidifier, lozenges  For sore throat: saline gargles, honey herbal tea, lozenges, throat spray  To dry out your nose: try an antihistamine like loratadine (non-sedating) or diphenhydramine (sedating) or others To relieve a stuffy nose: try an oral decongestant  Like pseudoephedrine if you are under the age of 66 and do not have high blood pressure, neti pot To make blowing your nose easier: guaifenesin    - Use ice and heat for the back  Back Exercises If you have pain in your back, do these exercises 2-3 times each day or as told by your doctor. When the pain goes away, do the exercises once each day, but repeat the steps more times for each exercise (do more repetitions). If you do not have pain in your back, do these exercises once each day or as told by your doctor. Exercises Single Knee to Chest  Do these steps 3-5 times in a row for each leg: 1. Lie on your back on a firm bed or the floor with your legs stretched out. 2. Bring one knee  to your chest. 3. Hold your knee to your chest by grabbing your knee or thigh. 4. Pull on your knee until you feel a gentle stretch in your lower back. 5. Keep doing the stretch for 10-30 seconds. 6. Slowly let go of your leg and straighten it.  Pelvic Tilt  Do these steps 5-10 times in a row: 1. Lie on your back on a firm bed or the floor with your legs stretched out. 2. Bend your knees so they point up to the ceiling. Your feet should be flat on the floor. 3. Tighten your lower belly (abdomen) muscles to press your lower back against the floor. This will make your tailbone point up to the ceiling instead of pointing down to your feet or the floor. 4. Stay in this position for 5-10 seconds while you gently tighten your muscles and breathe evenly.  Cat-Cow  Do these steps until your lower back bends more easily: 1. Get on your hands and knees on a firm surface. Keep your hands under your shoulders, and keep your knees under your hips. You may put padding under your knees. 2. Let your head hang down, and make your tailbone point down to the floor so your lower back is round like the back of a cat. 3. Stay in this position for 5 seconds. 4. Slowly lift your head and make your tailbone point up to the ceiling  so your back hangs low (sags) like the back of a cow. 5. Stay in this position for 5 seconds.  Press-Ups  Do these steps 5-10 times in a row: 1. Lie on your belly (face-down) on the floor. 2. Place your hands near your head, about shoulder-width apart. 3. While you keep your back relaxed and keep your hips on the floor, slowly straighten your arms to raise the top half of your body and lift your shoulders. Do not use your back muscles. To make yourself more comfortable, you may change where you place your hands. 4. Stay in this position for 5 seconds. 5. Slowly return to lying flat on the floor.  Bridges  Do these steps 10 times in a row: 1. Lie on your back on a firm  surface. 2. Bend your knees so they point up to the ceiling. Your feet should be flat on the floor. 3. Tighten your butt muscles and lift your butt off of the floor until your waist is almost as high as your knees. If you do not feel the muscles working in your butt and the back of your thighs, slide your feet 1-2 inches farther away from your butt. 4. Stay in this position for 3-5 seconds. 5. Slowly lower your butt to the floor, and let your butt muscles relax.  If this exercise is too easy, try doing it with your arms crossed over your chest. Belly Crunches  Do these steps 5-10 times in a row: 1. Lie on your back on a firm bed or the floor with your legs stretched out. 2. Bend your knees so they point up to the ceiling. Your feet should be flat on the floor. 3. Cross your arms over your chest. 4. Tip your chin a little bit toward your chest but do not bend your neck. 5. Tighten your belly muscles and slowly raise your chest just enough to lift your shoulder blades a tiny bit off of the floor. 6. Slowly lower your chest and your head to the floor.  Back Lifts Do these steps 5-10 times in a row: 1. Lie on your belly (face-down) with your arms at your sides, and rest your forehead on the floor. 2. Tighten the muscles in your legs and your butt. 3. Slowly lift your chest off of the floor while you keep your hips on the floor. Keep the back of your head in line with the curve in your back. Look at the floor while you do this. 4. Stay in this position for 3-5 seconds. 5. Slowly lower your chest and your face to the floor.  Contact a doctor if:  Your back pain gets a lot worse when you do an exercise.  Your back pain does not lessen 2 hours after you exercise. If you have any of these problems, stop doing the exercises. Do not do them again unless your doctor says it is okay. Get help right away if:  You have sudden, very bad back pain. If this happens, stop doing the exercises. Do not do  them again unless your doctor says it is okay. This information is not intended to replace advice given to you by your health care provider. Make sure you discuss any questions you have with your health care provider. Document Released: 09/23/2010 Document Revised: 01/27/2016 Document Reviewed: 10/15/2014 Elsevier Interactive Patient Education  [employer].

## 2018-03-18 ENCOUNTER — Other Ambulatory Visit: Payer: Self-pay | Admitting: Family Medicine

## 2018-03-18 DIAGNOSIS — Z1231 Encounter for screening mammogram for malignant neoplasm of breast: Secondary | ICD-10-CM

## 2018-03-21 DIAGNOSIS — M47816 Spondylosis without myelopathy or radiculopathy, lumbar region: Secondary | ICD-10-CM | POA: Diagnosis not present

## 2018-03-21 DIAGNOSIS — M5416 Radiculopathy, lumbar region: Secondary | ICD-10-CM | POA: Diagnosis not present

## 2018-04-10 DIAGNOSIS — L851 Acquired keratosis [keratoderma] palmaris et plantaris: Secondary | ICD-10-CM | POA: Diagnosis not present

## 2018-04-10 DIAGNOSIS — B351 Tinea unguium: Secondary | ICD-10-CM | POA: Diagnosis not present

## 2018-04-10 DIAGNOSIS — E1142 Type 2 diabetes mellitus with diabetic polyneuropathy: Secondary | ICD-10-CM | POA: Diagnosis not present

## 2018-04-10 DIAGNOSIS — E1159 Type 2 diabetes mellitus with other circulatory complications: Secondary | ICD-10-CM | POA: Diagnosis not present

## 2018-04-17 DIAGNOSIS — E1159 Type 2 diabetes mellitus with other circulatory complications: Secondary | ICD-10-CM | POA: Diagnosis not present

## 2018-04-17 DIAGNOSIS — E1165 Type 2 diabetes mellitus with hyperglycemia: Secondary | ICD-10-CM | POA: Diagnosis not present

## 2018-04-17 DIAGNOSIS — I10 Essential (primary) hypertension: Secondary | ICD-10-CM | POA: Diagnosis not present

## 2018-04-17 DIAGNOSIS — E1142 Type 2 diabetes mellitus with diabetic polyneuropathy: Secondary | ICD-10-CM | POA: Diagnosis not present

## 2018-04-17 DIAGNOSIS — E785 Hyperlipidemia, unspecified: Secondary | ICD-10-CM | POA: Diagnosis not present

## 2018-04-18 ENCOUNTER — Ambulatory Visit
Admission: RE | Admit: 2018-04-18 | Discharge: 2018-04-18 | Disposition: A | Discharge status: Home / Self Care | Payer: Medicare HMO | Source: Ambulatory Visit | Attending: Family Medicine | Admitting: Family Medicine

## 2018-04-18 DIAGNOSIS — Z1231 Encounter for screening mammogram for malignant neoplasm of breast: Secondary | ICD-10-CM | POA: Diagnosis not present

## 2018-05-03 DIAGNOSIS — M47816 Spondylosis without myelopathy or radiculopathy, lumbar region: Secondary | ICD-10-CM | POA: Diagnosis not present

## 2018-05-03 DIAGNOSIS — Z6841 Body Mass Index (BMI) 40.0 and over, adult: Secondary | ICD-10-CM | POA: Diagnosis not present

## 2018-05-03 DIAGNOSIS — M25551 Pain in right hip: Secondary | ICD-10-CM | POA: Diagnosis not present

## 2018-05-03 DIAGNOSIS — M5416 Radiculopathy, lumbar region: Secondary | ICD-10-CM | POA: Diagnosis not present

## 2018-05-27 DIAGNOSIS — G4733 Obstructive sleep apnea (adult) (pediatric): Secondary | ICD-10-CM | POA: Diagnosis not present

## 2018-05-27 DIAGNOSIS — F329 Major depressive disorder, single episode, unspecified: Secondary | ICD-10-CM | POA: Diagnosis not present

## 2018-05-27 DIAGNOSIS — N183 Chronic kidney disease, stage 3 (moderate): Secondary | ICD-10-CM | POA: Diagnosis not present

## 2018-05-27 DIAGNOSIS — I2510 Atherosclerotic heart disease of native coronary artery without angina pectoris: Secondary | ICD-10-CM | POA: Diagnosis not present

## 2018-05-27 DIAGNOSIS — Z87891 Personal history of nicotine dependence: Secondary | ICD-10-CM | POA: Diagnosis not present

## 2018-05-27 DIAGNOSIS — I209 Angina pectoris, unspecified: Secondary | ICD-10-CM | POA: Diagnosis not present

## 2018-05-27 DIAGNOSIS — E669 Obesity, unspecified: Secondary | ICD-10-CM | POA: Diagnosis not present

## 2018-05-27 DIAGNOSIS — E785 Hyperlipidemia, unspecified: Secondary | ICD-10-CM | POA: Diagnosis not present

## 2018-05-27 DIAGNOSIS — I10 Essential (primary) hypertension: Secondary | ICD-10-CM | POA: Diagnosis not present

## 2018-06-03 ENCOUNTER — Ambulatory Visit: Payer: Self-pay | Admitting: Family Medicine

## 2018-06-03 NOTE — Telephone Encounter (Signed)
I returned her call.   She is c/o having excessive sweating for the last couple of days. See triage notes.   She has an appt with Raelyn Ensign, FNP in the morning 06/04/18 at 8:00am.     Reason for Disposition . [1] SEVERE sweating (e.g., drenched with sweat) AND [2] cause unknown  Answer Assessment - Initial Assessment Questions 1. ONSET: "When did the sweating start?"      The last couple of days I've been sweating so bad.   It runs down my back.    2. LOCATION: "What part of your body has excessive sweating?" (e.g., entire body; just face, underarms, palms, or soles of feet).      I sweat all over.   I have to let my bed dry.   I've been through menopause. 3. SEVERITY: "How bad is the sweating?"    (Scale 1-10; or mild, moderate, severe)   -  MILD (1-3): doesn't interfere with normal activities    -  MODERATE (4-7): interferes with normal activities (e.g., work or school) or awakens from sleep; causes embarrassment in social situations    -  SEVERE (8-10): drenching sweats and has to change bed clothes or bed linens.     Real sweaty under my arms. 4. CAUSE: "What do you think is causing the sweating?"     I don't know why.    It just happens 4-5 times today. 5. FEVER: "Have you been having fevers?"     No 6. OTHER SYMPTOMS: "Do you have any other symptoms?" (e.g., chest pain, difficulty breathing, lightheadedness, weight loss)     No  Protocols used: [facility]

## 2018-06-04 ENCOUNTER — Ambulatory Visit (INDEPENDENT_AMBULATORY_CARE_PROVIDER_SITE_OTHER): Payer: Medicare HMO | Admitting: Family Medicine

## 2018-06-04 ENCOUNTER — Encounter: Payer: Self-pay | Admitting: Family Medicine

## 2018-06-04 VITALS — BP 130/80 | HR 82 | Temp 97.5°F | Resp 16 | Ht 65.0 in | Wt 221.9 lb

## 2018-06-04 DIAGNOSIS — Z23 Encounter for immunization: Secondary | ICD-10-CM | POA: Diagnosis not present

## 2018-06-04 DIAGNOSIS — R61 Generalized hyperhidrosis: Secondary | ICD-10-CM

## 2018-06-04 DIAGNOSIS — E1169 Type 2 diabetes mellitus with other specified complication: Secondary | ICD-10-CM | POA: Diagnosis not present

## 2018-06-04 DIAGNOSIS — R002 Palpitations: Secondary | ICD-10-CM | POA: Diagnosis not present

## 2018-06-04 DIAGNOSIS — R51 Headache: Secondary | ICD-10-CM | POA: Diagnosis not present

## 2018-06-04 DIAGNOSIS — M2550 Pain in unspecified joint: Secondary | ICD-10-CM

## 2018-06-04 DIAGNOSIS — E785 Hyperlipidemia, unspecified: Secondary | ICD-10-CM | POA: Diagnosis not present

## 2018-06-04 MED ORDER — CLONIDINE HCL 0.1 MG PO TABS: 0.1000 mg | ORAL_TABLET | Freq: Two times a day (BID) | ORAL | 1 refills | Status: DC | PRN

## 2018-06-04 NOTE — Patient Instructions (Signed)
Do not take more than 3500mg  Tylenol (about 10 tablets of the 325mg  Acetaminophen).

## 2018-06-04 NOTE — Progress Notes (Signed)
Name: Dana Sparks   MRN: [id number]    DOB: 1951/03/22   Date:06/04/2018       Progress Note  Subjective  Chief Complaint  Chief Complaint  Patient presents with  . Excessive Sweating  . Headache    HPI  Pt presents for evaluation for intermittent excessive sweating - she has had excessive sweating for years, but it has become significantly worse over the last week.  She was taking effexor and was told by Dr. Clayborn Bigness that this could be contributing to her sweating, so she stopped the medication after her last visit 05/27/2018.  Has been off of HRT for about a year.  Also notes bilateral breast pain this week as well. Episodes of sweating last a few minutes, and occur several times an hour. Does not seem to be related to exertion.  Pt has DM - BG's have been normal when she has the sweating episodes.  She endorses headache - taking 3 tylenol 3-4 times a day.  She endorses 1 episode of abdominal pain last night, and this went away after 30 minutes - no NVD or constipation.  Endorses chills, frequent urination - but she drinks a lot of water.  Denies shortness of breath, fevers, confusion, slurred speech, facial droop.  Seeing Dr. Clayborn Bigness annually for stable angina - Last visit 05/27/2018 - Recommend sleep study and weight loss, continue current medications for stable angina, and follow up in 1 year.  Former smoker-quit 5 years ago, prior to this she had smoked since she was 67 years old approximately 1 pack/day.  She did have CT chest lung cancer screening on 02/13/2018, and this was positive for emphysema and aortic atherosclerosis.  Recommendation was to repeat in 12 months.  Mammogram was performed on 04/18/2018 and was negative.  Recommendation was to repeat in 1 year.  She also had a recent MRI of her lumbar spine this showed no canal stenosis and no fracture or malalignment.  There were acute reactive changes in the left L5-S1 associated with severe facet arthropathy.  Also mild L4-L5  neural foraminal narrowing per the impression report.  He  Patient Active Problem List   Diagnosis Date Noted  . Type II or unspecified type diabetes mellitus with neurological manifestations, not stated as uncontrolled(250.60) 09/03/2017  . Nasal polyp 09/03/2017  . Lipoma of back 09/03/2017  . Claudication ([place]) 06/04/2017  . LVH (left ventricular hypertrophy) 02/27/2017  . Decreased cardiac ejection fraction 02/27/2017  . Left hand weakness 10/12/2016  . Elevated antinuclear antibody (ANA) level 04/27/2016  . Angina pectoris ([place]) 11/01/2015  . Well controlled type 2 diabetes mellitus with gastroparesis ([place]) 06/22/2015  . Low TSH level 06/22/2015  . Iron deficiency anemia 04/07/2015  . Intestinal metaplasia of gastric mucosa 03/11/2015  . CAD (coronary artery disease), native coronary artery 02/24/2015  . History of coronary artery stent placement 02/24/2015  . Chronic kidney disease, stage 3, mod decreased GFR ([facility]) 02/24/2015  . Menopause 02/24/2015  . History of Helicobacter pylori infection 02/24/2015  . Mild mitral insufficiency 02/24/2015  . Mild tricuspid insufficiency 02/24/2015  . 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 ([place]) 02/24/2015  . Controlled gout 02/24/2015  . Depression, major, recurrent, mild ([place]) 02/24/2015  . Morbid obesity due to excess calories ([place]) 02/24/2015  . Mild pulmonary hypertension ([place]) 02/24/2015  . Gastroesophageal reflux disease without esophagitis 02/24/2015  . Perennial allergic rhinitis 02/24/2015  . HLD (hyperlipidemia) 02/20/2015  . Hypertension, benign 02/20/2015  . Apnea, sleep  02/20/2015  . Controlled diabetes mellitus with stage 3 chronic kidney disease, without long-term current use of insulin ([place]) 02/20/2015    Past Surgical History:  Procedure Laterality Date  . ABDOMINAL HYSTERECTOMY     total  . CATARACT EXTRACTION    . COLONOSCOPY  01/2014   sigmoid diverticulosis. desc colon TA,  hyperplastic polyp  . CORONARY ANGIOPLASTY WITH STENT PLACEMENT    . ESOPHAGOGASTRODUODENOSCOPY N/A 02/16/2015   negative h pylori, focal gastic intestinal metaplasia  . TOTAL KNEE ARTHROPLASTY Right     Family History  Problem Relation Age of Onset  . Stroke Sister   . Hyperlipidemia Sister   . Alcohol abuse Brother   . Diabetes Brother   . Stroke Brother   . Hypertension Mother   . Diabetes Mother   . Heart disease Mother   . Diabetes Father   . Heart disease Father   . Hypertension Father   . Hypertension Sister   . Cancer Maternal Grandmother        Unsure  . Diabetes Maternal Grandfather   . Colon cancer Neg Hx   . Liver disease Neg Hx   . Breast cancer Neg Hx     Social History   Socioeconomic History  . Marital status: Married    Spouse name: Fritz Pickerel  . Number of children: 2  . Years of education: some college  . Highest education level: 12th grade  Occupational History  . Occupation: Disabled  Social Needs  . Financial resource strain: Hard  . Food insecurity:    Worry: Never true    Inability: Never true  . Transportation needs:    Medical: No    Non-medical: No  Tobacco Use  . Smoking status: Former Smoker    Packs/day: 1.00    Years: 40.00    Pack years: 40.00    Types: Cigarettes    Start date: 09/04/1972    Last attempt to quit: 12/30/2012    Years since quitting: 5.4  . Smokeless tobacco: Never Used  . Tobacco comment: smoking cessation materials not required  Substance and Sexual Activity  . Alcohol use: No    Alcohol/week: 0.0 standard drinks  . Drug use: No  . Sexual activity: Not Currently  Lifestyle  . Physical activity:    Days per week: 5 days    Minutes per session: 90 min  . Stress: Not at all  Relationships  . Social connections:    Talks on phone: More than three times a week    Gets together: More than three times a week    Attends religious service: More than 4 times per year    Active member of club or organization: Yes     Attends meetings of clubs or organizations: More than 4 times per year    Relationship status: Married  . Intimate partner violence:    Fear of current or ex partner: No    Emotionally abused: No    Physically abused: No    Forced sexual activity: No  Other Topics Concern  . Not on file  Social History Narrative   Married - current 61rd,    Two children from first marriage   He has 4 children from previous marriage     Current Outpatient Medications:  .  ACCU-CHEK FASTCLIX LANCETS MISC, , Disp: , Rfl:  .  ACCU-CHEK SMARTVIEW test strip, Check fsbs two times daily  DMII, Disp: 100 each, Rfl: 6 .  albuterol (PROVENTIL) (2.5 MG/3ML) 0.083% nebulizer  solution, Take 3 mLs (2.5 mg total) by nebulization every 6 (six) hours as needed for wheezing or shortness of breath., Disp: 75 mL, Rfl: 0 .  Alcohol Swabs (B-D SINGLE USE SWABS REGULAR) PADS, 1 each by Does not apply route 4 (four) times daily., Disp: 200 each, Rfl: 2 .  allopurinol (ZYLOPRIM) 100 MG tablet, Take 1 tablet (100 mg total) by mouth daily., Disp: 90 tablet, Rfl: 3 .  aspirin EC 81 MG tablet, Take 81 mg by mouth every morning. , Disp: , Rfl:  .  BD INSULIN SYRINGE U/F 31G X 5/16" 0.3 ML MISC, , Disp: , Rfl:  .  blood glucose meter kit and supplies KIT, Dispense based on patient and insurance preference. Use up to four times daily as directed. (FOR ICD-9 250.00, 250.01)., Disp: 1 each, Rfl: 0 .  clindamycin (CLEOCIN) 150 MG capsule, Take 150 mg by mouth 4 (four) times daily. Take 4 tablets at once one hour before dental appointment., Disp: , Rfl:  .  Coenzyme Q10 100 MG capsule, TAKE 1 CAPSULE EVERY DAY, Disp: 90 capsule, Rfl: 1 .  Ferrous Sulfate (IRON) 325 (65 Fe) MG TABS, Take 325 mg by mouth 2 (two) times daily. Nature made brand-  Pt takes one tablet twice a day , Disp: , Rfl:  .  fluticasone (FLONASE) 50 MCG/ACT nasal spray, Place 2 sprays into both nostrils daily., Disp: 48 g, Rfl: 2 .  glipiZIDE (GLUCOTROL XL) 10 MG 24 hr  tablet, TAKE 1 TABLET EVERY DAY WITH BREAKFAST, Disp: 90 tablet, Rfl: 1 .  insulin NPH Human (HUMULIN N,NOVOLIN N) 100 UNIT/ML injection, Inject 15-20 Units into the skin 2 (two) times daily before a meal. 15 units in the morning and 20 units at bedtime, Disp: , Rfl:  .  ipratropium (ATROVENT) 0.03 % nasal spray, Place 2 sprays into both nostrils every 12 (twelve) hours., Disp: 1 mL, Rfl: 0 .  isosorbide mononitrate (IMDUR) 60 MG 24 hr tablet, Take 1 tablet (60 mg total) by mouth daily., Disp: 90 tablet, Rfl: 1 .  lisinopril (PRINIVIL,ZESTRIL) 40 MG tablet, Take 1 tablet (40 mg total) by mouth daily., Disp: 90 tablet, Rfl: 0 .  loratadine (CLARITIN) 10 MG tablet, Take 1 tablet (10 mg total) by mouth daily., Disp: 30 tablet, Rfl: 3 .  meloxicam (MOBIC) 7.5 MG tablet, Take by mouth., Disp: , Rfl:  .  metoprolol succinate (TOPROL-XL) 50 MG 24 hr tablet, Take 1 tablet (50 mg total) by mouth daily., Disp: 90 tablet, Rfl: 3 .  montelukast (SINGULAIR) 10 MG tablet, Take 1 tablet by mouth every evening., Disp: , Rfl:  .  pantoprazole (PROTONIX) 40 MG tablet, Take 1 tablet (40 mg total) by mouth every morning., Disp: 90 tablet, Rfl: 1 .  rosuvastatin (CRESTOR) 20 MG tablet, Take 1 tablet (20 mg total) by mouth daily., Disp: 90 tablet, Rfl: 1 .  traMADol (ULTRAM) 50 MG tablet, Take 1 tablet (50 mg total) by mouth every 6 (six) hours as needed., Disp: 20 tablet, Rfl: 0 .  venlafaxine XR (EFFEXOR-XR) 150 MG 24 hr capsule, Take 1 capsule (150 mg total) by mouth daily with breakfast. Daily (Patient not taking: Reported on 06/04/2018), Disp: 90 capsule, Rfl: 1  Allergies  Allergen Reactions  . Codeine Other (See Comments)    Unknown reaction  . Contrast Media [Iodinated Diagnostic Agents] Itching  . Sulfa Antibiotics Itching    I personally reviewed active problem list, medication list, allergies, social history, notes from last encounter, lab  results with the patient/caregiver today.   ROS Ten systems  reviewed and is negative except as mentioned in HPI   Objective  Vitals:   06/04/18 0752 06/04/18 0810  BP: (!) 150/100 130/80  Pulse: (!) 105 82  Resp: 16   Temp: (!) 97.5 F (36.4 C)   TempSrc: Oral   SpO2: 97%   Weight: 221 lb 14.4 oz (100.7 kg)   Height: _0  (1.651 m)     Body mass index is 36.93 kg/m.  Physical Exam Constitutional: Patient appears well-developed and well-nourished. No distress.  HENT: Head: Normocephalic and atraumatic. Ears: bilateral TMs with no erythema or effusion; Nose: Nose normal. Mouth/Throat: Oropharynx is clear and moist. No oropharyngeal exudate or tonsillar swelling.  Eyes: Conjunctivae and EOM are normal. No scleral icterus.  Pupils are equal, round, and reactive to light.  Temples are supple. Neck: Normal range of motion. Neck supple. No JVD present. No thyromegaly present.  Cardiovascular: Normal rate, regular rhythm and normal heart sounds.  No murmur heard. No BLE edema. Pulmonary/Chest: Effort normal and breath sounds normal. No respiratory distress. Abdominal: Soft. Bowel sounds are normal, no distension. There is no tenderness. No masses. Musculoskeletal: Normal range of motion, no joint effusions. No gross deformities.  There is tenderness between the shoulder blades, and bilateral hips.  She also exhibits tenderness along the chest wall with palpation. Breast: Nipples without discharge, no masses or lumps.  There is no breast tenderness.  The tenderness is along the chest wall. Neurological: Pt is alert and oriented to person, place, and time. No cranial nerve deficit. Coordination, balance, strength, speech and gait are normal.  Skin: Skin is warm. No rash noted. No erythema.  Upon getting of examination patient is moderately diaphoretic, however this resolves after approximately 3 minutes. Psychiatric: Patient has a normal mood and affect. behavior is normal. Judgment and thought content normal.  No results found for this or any  previous visit (from the past 72 hour(s)).   PHQ2/9: Depression screen [facility] 2/9 06/04/2018 01/04/2018 01/04/2018 09/03/2017 05/23/2017  Decreased Interest 0 0 1 0 0  Down, Depressed, Hopeless 0 0 0 0 0  PHQ - 2 Score 0 0 1 0 0  Altered sleeping - 0 0 - -  Tired, decreased energy - 0 1 - -  Change in appetite - 3 0 - -  Feeling bad or failure about yourself  - 0 1 - -  Trouble concentrating - 0 0 - -  Moving slowly or fidgety/restless - 0 0 - -  Suicidal thoughts - 0 0 - -  PHQ-9 Score - 3 3 - -  Difficult doing work/chores - Not difficult at all - - -     Fall Risk: Fall Risk  06/04/2018 01/04/2018 01/04/2018 12/06/2017 10/03/2017  Falls in the past year? Yes No No No No  Number falls in past yr: 1 - - - -  Injury with Fall? No - - - -  Comment - - - - -  Risk for fall due to : - History of fall(s);Impaired vision - - -    Functional Status Survey: Is the patient deaf or have difficulty hearing?: No Does the patient have difficulty seeing, even when wearing glasses/contacts?: No Does the patient have difficulty concentrating, remembering, or making decisions?: No Does the patient have difficulty walking or climbing stairs?: No Does the patient have difficulty dressing or bathing?: No Does the patient have difficulty doing errands alone such as visiting a doctor's office  or shopping?: No   Assessment & Plan  1. Excessive sweating - PTH, Intact and Calcium - TSH - FSH/LH - COMPLETE METABOLIC PANEL WITH GFR - CBC w/Diff/Platelet - Sed Rate (ESR) - cloNIDine (CATAPRES) 0.1 MG tablet; Take 1 tablet (0.1 mg total) by mouth 2 (two) times daily as needed. Start taking 1 tablet at bedtime for sweating, may increase to twice daily as tolerated.  Dispense: 60 tablet; Refill: 1  2. Arthralgia, unspecified joint - COMPLETE METABOLIC PANEL WITH GFR - CBC w/Diff/Platelet - Sed Rate (ESR)  3. Need for influenza vaccination - Flu vaccine HIGH DOSE PF (Fluzone High dose)  Discussed the case  in detail with Dr. Ancil Boozer.  She is in agreement with the plan of care at this time.  We will trial clonidine at night and may increase to twice daily if tolerating after several days.  Advised patient that lab work will guide [place] to potential referral to endocrine or rheumatology and/or imaging or additional labs. Red flags discussed including signs and symptoms of stroke and heart attack, severe abdominal pain or vomiting or diarrhea.  She verbalizes understanding.  Follow-up based on lab results.

## 2018-06-05 ENCOUNTER — Telehealth: Payer: Self-pay | Admitting: Family Medicine

## 2018-06-05 ENCOUNTER — Telehealth: Payer: Self-pay

## 2018-06-05 ENCOUNTER — Ambulatory Visit (INDEPENDENT_AMBULATORY_CARE_PROVIDER_SITE_OTHER): Payer: Medicare HMO | Admitting: Nurse Practitioner

## 2018-06-05 ENCOUNTER — Encounter: Payer: Self-pay | Admitting: Nurse Practitioner

## 2018-06-05 VITALS — BP 136/84 | HR 81 | Temp 97.5°F | Resp 14 | Ht 65.0 in | Wt 221.0 lb

## 2018-06-05 DIAGNOSIS — R519 Headache, unspecified: Secondary | ICD-10-CM

## 2018-06-05 DIAGNOSIS — R51 Headache: Secondary | ICD-10-CM | POA: Diagnosis not present

## 2018-06-05 DIAGNOSIS — R61 Generalized hyperhidrosis: Secondary | ICD-10-CM | POA: Diagnosis not present

## 2018-06-05 DIAGNOSIS — R002 Palpitations: Secondary | ICD-10-CM

## 2018-06-05 DIAGNOSIS — F330 Major depressive disorder, recurrent, mild: Secondary | ICD-10-CM

## 2018-06-05 DIAGNOSIS — E785 Hyperlipidemia, unspecified: Secondary | ICD-10-CM | POA: Diagnosis not present

## 2018-06-05 DIAGNOSIS — R4586 Emotional lability: Secondary | ICD-10-CM

## 2018-06-05 DIAGNOSIS — E1169 Type 2 diabetes mellitus with other specified complication: Secondary | ICD-10-CM

## 2018-06-05 MED ORDER — VENLAFAXINE HCL ER 37.5 MG PO CP24: ORAL_CAPSULE | ORAL | 0 refills | Status: DC

## 2018-06-05 MED ORDER — KETOROLAC TROMETHAMINE 60 MG/2ML IM SOLN: 60.0000 mg | Freq: Once | INTRAMUSCULAR | Status: AC

## 2018-06-05 MED ORDER — KETOROLAC TROMETHAMINE 30 MG/ML IJ SOLN
30.0000 mg | Freq: Once | INTRAMUSCULAR | Status: DC
  Administered 2018-06-05: 30 mg via INTRAMUSCULAR

## 2018-06-05 NOTE — Telephone Encounter (Signed)
Spoke directly with [name] regarding her symptoms.  We will bring her back to the office today - 1:40pm with Benjamine Mola NP as I am out of office this afternoon.  Discussed case with Benjamine Mola NP and additional laboratory testing that could potentially be performed today.  Dr. Ancil Boozer to be made aware.

## 2018-06-05 NOTE — Patient Instructions (Addendum)
-   Please complete your 24 hours urine test as soon as possible. Once you have turned in your urine you can re-start your effexor.  - Start by taking 37.5mg  capsule of Effexor once a day for 7 days, then take 2, 37.5mg  capsules for 7 days, then you can go back up to your old pills.  - Continue to drink plenty of water, and take acetaminophen for headache pain ( no more than 3,000mg  in 24 hours)   - If you're headache is not improving with medications, or is significantly worsening, or you have any new or concerning symptoms please go to the ER.

## 2018-06-05 NOTE — Progress Notes (Signed)
Name: Dana Sparks   MRN: [id number]    DOB: [birth year]   Date:06/05/2018       Progress Note  Subjective  Chief Complaint  Chief Complaint  Patient presents with  . Follow-up    HPI  Patient presents today for headache and emotional lability- crying episodes today, nothing precipitates it lasts for a few minutes to hours and then she is able to regain emotional control. States is not someone that cries normally. Took herself off off effexor 150 mg last week- stopped abruptly- states had some body aches but emotionally was doing fine until today. Headache has been intermittent, frontal headache with no associated nausea, vision changes, dizziness. Headache occurs after excessive sweating episode- patient was seen for this by Raquel Sarna NP, yesterday. Headache resolves with 2 tylenol typically, states this time tylenol helped dull headache but it is still present. No SI.   Patient endorses 4 episodes yesterday while in the office of diaphoresis lasting about 5 minutes states has mild palpitations and follows with headache afterwards. States since then had has too many episodes to count. Has been off of HRT for about a year. No bowel changes, polyuria, polydipsia, polyphagia, cold/hot intolerances, sleep changes.   Patient Active Problem List   Diagnosis Date Noted  . Type II or unspecified type diabetes mellitus with neurological manifestations, not stated as uncontrolled(250.60) 09/03/2017  . Nasal polyp 09/03/2017  . Lipoma of back 09/03/2017  . Claudication ([place]) 06/04/2017  . LVH (left ventricular hypertrophy) 02/27/2017  . Decreased cardiac ejection fraction 02/27/2017  . Left hand weakness 10/12/2016  . Elevated antinuclear antibody (ANA) level 04/27/2016  . Angina pectoris ([place]) 11/01/2015  . Well controlled type 2 diabetes mellitus with gastroparesis ([place]) 06/22/2015  . Low TSH level 06/22/2015  . Iron deficiency anemia 04/07/2015  . Intestinal metaplasia of gastric mucosa 03/11/2015   . CAD (coronary artery disease), native coronary artery 02/24/2015  . History of coronary artery stent placement 02/24/2015  . Chronic kidney disease, stage 3, mod decreased GFR (HCC) 02/24/2015  . Menopause 02/24/2015  . History of Helicobacter pylori infection 02/24/2015  . Mild mitral insufficiency 02/24/2015  . Mild tricuspid insufficiency 02/24/2015  . 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 ([place]) 02/24/2015  . Controlled gout 02/24/2015  . Depression, major, recurrent, mild ([place]) 02/24/2015  . Morbid obesity due to excess calories ([place]) 02/24/2015  . Mild pulmonary hypertension ([place]) 02/24/2015  . Gastroesophageal reflux disease without esophagitis 02/24/2015  . Perennial allergic rhinitis 02/24/2015  . HLD (hyperlipidemia) 02/20/2015  . Hypertension, benign 02/20/2015  . Apnea, sleep 02/20/2015  . Controlled diabetes mellitus with stage 3 chronic kidney disease, without long-term current use of insulin ([place]) 02/20/2015    Past Medical History:  Diagnosis Date  . Allergy   . Anxiety   . Arthritis   . CAD (coronary artery disease)   . COPD (chronic obstructive pulmonary disease) ([place])   . Depression   . Diabetes mellitus without complication ([place])   . Diverticulitis   . Gastroparesis   . Gout   . Hyperlipemia   . Hypertension   . Positive H. pylori test   . Renal insufficiency   . Sleep apnea   . Thrombocytosis ([place]) 01/28/2015  . Tubular adenoma of colon 01/20/14    Past Surgical History:  Procedure Laterality Date  . ABDOMINAL HYSTERECTOMY     total  . CATARACT EXTRACTION    . COLONOSCOPY  01/2014   sigmoid diverticulosis. desc colon TA, hyperplastic  polyp  . CORONARY ANGIOPLASTY WITH STENT PLACEMENT    . ESOPHAGOGASTRODUODENOSCOPY N/A 02/16/2015   negative h pylori, focal gastic intestinal metaplasia  . TOTAL KNEE ARTHROPLASTY Right     Social History   Tobacco Use  . Smoking status: Former Smoker    Packs/day: 1.00    Years:  40.00    Pack years: 40.00    Types: Cigarettes    Start date: 09/04/1972    Last attempt to quit: 12/30/2012    Years since quitting: 5.4  . Smokeless tobacco: Never Used  . Tobacco comment: smoking cessation materials not required  Substance Use Topics  . Alcohol use: No    Alcohol/week: 0.0 standard drinks     Current Outpatient Medications:  .  ACCU-CHEK FASTCLIX LANCETS MISC, , Disp: , Rfl:  .  ACCU-CHEK SMARTVIEW test strip, Check fsbs two times daily  DMII, Disp: 100 each, Rfl: 6 .  albuterol (PROVENTIL) (2.5 MG/3ML) 0.083% nebulizer solution, Take 3 mLs (2.5 mg total) by nebulization every 6 (six) hours as needed for wheezing or shortness of breath., Disp: 75 mL, Rfl: 0 .  Alcohol Swabs (B-D SINGLE USE SWABS REGULAR) PADS, 1 each by Does not apply route 4 (four) times daily., Disp: 200 each, Rfl: 2 .  allopurinol (ZYLOPRIM) 100 MG tablet, Take 1 tablet (100 mg total) by mouth daily., Disp: 90 tablet, Rfl: 3 .  aspirin EC 81 MG tablet, Take 81 mg by mouth every morning. , Disp: , Rfl:  .  BD INSULIN SYRINGE U/F 31G X 5/16" 0.3 ML MISC, , Disp: , Rfl:  .  blood glucose meter kit and supplies KIT, Dispense based on patient and insurance preference. Use up to four times daily as directed. (FOR ICD-9 250.00, 250.01)., Disp: 1 each, Rfl: 0 .  clindamycin (CLEOCIN) 150 MG capsule, Take 150 mg by mouth 4 (four) times daily. Take 4 tablets at once one hour before dental appointment., Disp: , Rfl:  .  cloNIDine (CATAPRES) 0.1 MG tablet, Take 1 tablet (0.1 mg total) by mouth 2 (two) times daily as needed. Start taking 1 tablet at bedtime for sweating, may increase to twice daily as tolerated., Disp: 60 tablet, Rfl: 1 .  Coenzyme Q10 100 MG capsule, TAKE 1 CAPSULE EVERY DAY, Disp: 90 capsule, Rfl: 1 .  Ferrous Sulfate (IRON) 325 (65 Fe) MG TABS, Take 325 mg by mouth 2 (two) times daily. Nature made brand-  Pt takes one tablet twice a day , Disp: , Rfl:  .  fluticasone (FLONASE) 50 MCG/ACT nasal  spray, Place 2 sprays into both nostrils daily., Disp: 48 g, Rfl: 2 .  glipiZIDE (GLUCOTROL XL) 10 MG 24 hr tablet, TAKE 1 TABLET EVERY DAY WITH BREAKFAST, Disp: 90 tablet, Rfl: 1 .  insulin NPH Human (HUMULIN N,NOVOLIN N) 100 UNIT/ML injection, Inject 15-20 Units into the skin 2 (two) times daily before a meal. 15 units in the morning and 20 units at bedtime, Disp: , Rfl:  .  ipratropium (ATROVENT) 0.03 % nasal spray, Place 2 sprays into both nostrils every 12 (twelve) hours., Disp: 1 mL, Rfl: 0 .  isosorbide mononitrate (IMDUR) 60 MG 24 hr tablet, Take 1 tablet (60 mg total) by mouth daily., Disp: 90 tablet, Rfl: 1 .  lisinopril (PRINIVIL,ZESTRIL) 40 MG tablet, Take 1 tablet (40 mg total) by mouth daily., Disp: 90 tablet, Rfl: 0 .  loratadine (CLARITIN) 10 MG tablet, Take 1 tablet (10 mg total) by mouth daily., Disp: 30 tablet, Rfl: 3 .  meloxicam (MOBIC) 7.5 MG tablet, Take by mouth., Disp: , Rfl:  .  metoprolol succinate (TOPROL-XL) 50 MG 24 hr tablet, Take 1 tablet (50 mg total) by mouth daily., Disp: 90 tablet, Rfl: 3 .  montelukast (SINGULAIR) 10 MG tablet, Take 1 tablet by mouth every evening., Disp: , Rfl:  .  pantoprazole (PROTONIX) 40 MG tablet, Take 1 tablet (40 mg total) by mouth every morning., Disp: 90 tablet, Rfl: 1 .  rosuvastatin (CRESTOR) 20 MG tablet, Take 1 tablet (20 mg total) by mouth daily., Disp: 90 tablet, Rfl: 1 .  traMADol (ULTRAM) 50 MG tablet, Take 1 tablet (50 mg total) by mouth every 6 (six) hours as needed., Disp: 20 tablet, Rfl: 0 .  venlafaxine XR (EFFEXOR XR) 37.5 MG 24 hr capsule, Take one tablet a day for 7 days then take 2 tablets a day for 7 days, Disp: 21 capsule, Rfl: 0  Current Facility-Administered Medications:  .  ketorolac (TORADOL) injection 60 mg, 60 mg, Intramuscular, Once, Marko Skalski, Bethel Born, NP  Allergies  Allergen Reactions  . Codeine Other (See Comments)    Unknown reaction  . Contrast Media [Iodinated Diagnostic Agents] Itching  . Sulfa  Antibiotics Itching    ROS   No other specific complaints in a complete review of systems (except as listed in HPI above).  Objective  Vitals:   06/05/18 1351  BP: 136/84  Pulse: 81  Resp: 14  Temp: (!) 97.5 F (36.4 C)  TempSrc: Oral  SpO2: 98%  Weight: 221 lb (100.2 kg)  Height: '5\' 5"'  (1.651 m)    Body mass index is 36.78 kg/m.  Nursing Note and Vital Signs reviewed.  Physical Exam  Constitutional: She is oriented to person, place, and time. She appears well-developed and well-nourished.  Patient is tearful momentarily prior to impending hot flash.   HENT:  Head: Normocephalic and atraumatic.  Eyes: Pupils are equal, round, and reactive to light. Conjunctivae and EOM are normal.  Neck: Normal range of motion. Neck supple. No thyromegaly present.  Cardiovascular: Normal rate, regular rhythm, normal heart sounds and intact distal pulses.  Pulmonary/Chest: Effort normal and breath sounds normal.  Abdominal: Soft. Bowel sounds are normal. There is no tenderness.  Neurological: She is alert and oriented to person, place, and time. No sensory deficit. Coordination normal.  Skin: Skin is warm and dry. No erythema.  Psychiatric: Her speech is normal and behavior is normal. Judgment and thought content normal. She exhibits a depressed mood.      Results for orders placed or performed in visit on 06/04/18 (from the past 48 hour(s))  PTH, Intact and Calcium     Status: None   Collection Time: 06/04/18  9:14 AM  Result Value Ref Range   PTH 50 14 - 64 pg/mL    Comment: . Interpretive Guide    Intact PTH           Calcium ------------------    ----------           ------- Normal Parathyroid    Normal               Normal Hypoparathyroidism    Low or Low Normal    Low Hyperparathyroidism    Primary            Normal or High       High    Secondary          High  Normal or Low    Tertiary           High                 High Non-Parathyroid     Hypercalcemia      Low or Low Normal    High .    Calcium 9.8 8.6 - 10.4 mg/dL  TSH     Status: None   Collection Time: 06/04/18  9:14 AM  Result Value Ref Range   TSH 0.49 0.40 - 4.50 mIU/L  FSH/LH     Status: None   Collection Time: 06/04/18  9:14 AM  Result Value Ref Range   FSH 18.7 mIU/mL    Comment:                     Reference Range .              Follicular Phase       2.9-19.1              Mid-cycle Peak         3.1-17.7              Luteal Phase           1.5- 9.1              Postmenopausal       23.0-116.3              .    LH 7.1 mIU/mL    Comment:     Reference Range Follicular Phase  6.6-06.0 Mid-Cycle Peak    8.7-76.3 Luteal Phase      0.5-16.9 Postmenopausal    10.0-54.7   COMPLETE METABOLIC PANEL WITH GFR     Status: Abnormal   Collection Time: 06/04/18  9:14 AM  Result Value Ref Range   Glucose, Bld 142 (H) 65 - 99 mg/dL    Comment: .            Fasting reference interval . For someone without known diabetes, a glucose value >125 mg/dL indicates that they may have diabetes and this should be confirmed with a follow-up test. .    BUN 14 7 - 25 mg/dL   Creat 0.84 0.50 - 0.99 mg/dL    Comment: For patients >23 years of age, the reference limit for Creatinine is approximately 13% higher for people identified as African-American. .    GFR, Est Non African American 72 > OR = 60 mL/min/1.66m   GFR, Est African American 84 > OR = 60 mL/min/1.75m  BUN/Creatinine Ratio NOT APPLICABLE 6 - 22 (calc)   Sodium 141 135 - 146 mmol/L   Potassium 3.9 3.5 - 5.3 mmol/L   Chloride 104 98 - 110 mmol/L   CO2 25 20 - 32 mmol/L   Calcium 9.8 8.6 - 10.4 mg/dL   Total Protein 6.6 6.1 - 8.1 g/dL   Albumin 4.5 3.6 - 5.1 g/dL   Globulin 2.1 1.9 - 3.7 g/dL (calc)   AG Ratio 2.1 1.0 - 2.5 (calc)   Total Bilirubin 0.2 0.2 - 1.2 mg/dL   Alkaline phosphatase (APISO) 56 33 - 130 U/L   AST 16 10 - 35 U/L   ALT 11 6 - 29 U/L  CBC w/Diff/Platelet     Status: None    Collection Time: 06/04/18  9:14 AM  Result Value Ref Range   WBC 7.3 3.8 - 10.8 Thousand/uL   RBC 4.37 3.80 -  5.10 Million/uL   Hemoglobin 13.2 11.7 - 15.5 g/dL   HCT 39.6 35.0 - 45.0 %   MCV 90.6 80.0 - 100.0 fL   MCH 30.2 27.0 - 33.0 pg   MCHC 33.3 32.0 - 36.0 g/dL   RDW 13.1 11.0 - 15.0 %   Platelets 320 140 - 400 Thousand/uL   MPV 10.2 7.5 - 12.5 fL   Neutro Abs 3,176 1,500 - 7,800 cells/uL   Lymphs Abs 3,402 850 - 3,900 cells/uL   WBC mixed population 460 200 - 950 cells/uL   Eosinophils Absolute 234 15 - 500 cells/uL   Basophils Absolute 29 0 - 200 cells/uL   Neutrophils Relative % 43.5 %   Total Lymphocyte 46.6 %   Monocytes Relative 6.3 %   Eosinophils Relative 3.2 %   Basophils Relative 0.4 %  Sed Rate (ESR)     Status: None   Collection Time: 06/04/18  9:14 AM  Result Value Ref Range   Sed Rate 2 0 - 30 mm/h    Assessment & Plan 1. Diaphoresis - C-reactive protein - 5 HIAA, quantitative, Urine, 24 hour - Metanephrines, Urine, 24 hour - VMA, urine, 24 hour - Catecholamines, fractionated, Urine, 24 hour  2. Acute intractable headache, unspecified headache type - C-reactive protein - 5 HIAA, quantitative, Urine, 24 hour - Metanephrines, Urine, 24 hour - VMA, urine, 24 hour - Catecholamines, fractionated, Urine, 24 hour  3. Palpitations - C-reactive protein - 5 HIAA, quantitative, Urine, 24 hour - Metanephrines, Urine, 24 hour - VMA, urine, 24 hour - Catecholamines, fractionated, Urine, 24 hour  4. Emotional lability - venlafaxine XR (EFFEXOR XR) 37.5 MG 24 hr capsule; Take one tablet a day for 7 days then take 2 tablets a day for 7 days  Dispense: 21 capsule; Refill: 0  5. Depression, major, recurrent, mild (HCC) - venlafaxine XR (EFFEXOR XR) 37.5 MG 24 hr capsule; Take one tablet a day for 7 days then take 2 tablets a day for 7 days  Dispense: 21 capsule; Refill: 0  6. Dyslipidemia associated with type 2 diabetes mellitus (HCC) - HgB A1c  For 24 hour  urine collection will stop metoprolol and lisinopril for 48 hours on Saturday and Sunday and return collection Monday morning. Will restart effexor and blood pressure medications after returning urine collection. Discussed case and plan with Dr. Ancil Boozer.  Face-to-face time with patient was more than 25 minutes, >50% time spent counseling and coordination of care

## 2018-06-05 NOTE — Telephone Encounter (Signed)
Copied from [place] [id number]. Topic: Quick Communication - See Telephone Encounter >> Jun 05, 2018 12:23 PM Hewitt Shorts wrote: Dana Sparks pt daughter is calling in reference to appt  yesterday that the medication that was given is not working and symptoms are worse -pt daughter has done a email to both  Providers sowles and emily boyce and she would like a call back immediately   Best number [phone]  Pt nuimber [phone]

## 2018-06-05 NOTE — Telephone Encounter (Signed)
Received the following message from Brendolyn Patty regarding her mother, Dana Sparks:  Hello this message is on the behalf of my mother Dana Sparks she is currently still having the episodes explained yesterday  At the present time she has been crying uncontrollably as well as having the terrible headache  Need someone to reach out to her IMMEDIATELY  PH#:[phone]  Attempted to call the patient to advise she seek emergency care, there was no answer.  Unable to find DPR.  Please call again to try to advise ER for further evaluation.

## 2018-06-07 LAB — TEST AUTHORIZATION 2

## 2018-06-07 LAB — COMPLETE METABOLIC PANEL WITH GFR
AG Ratio: 2.1 (calc) (ref 1.0–2.5)
ALBUMIN MSPROF: 4.5 g/dL (ref 3.6–5.1)
ALKALINE PHOSPHATASE (APISO): 56 U/L (ref 33–130)
ALT: 11 U/L (ref 6–29)
AST: 16 U/L (ref 10–35)
BUN: 14 mg/dL (ref 7–25)
CALCIUM: 9.8 mg/dL (ref 8.6–10.4)
CO2: 25 mmol/L (ref 20–32)
Chloride: 104 mmol/L (ref 98–110)
Creat: 0.84 mg/dL (ref 0.50–0.99)
GFR, EST NON AFRICAN AMERICAN: 72 mL/min/{1.73_m2} (ref >=60)
GFR, Est African American: 84 mL/min/{1.73_m2} (ref >=60)
GLOBULIN: 2.1 g/dL (ref 1.9–3.7)
GLUCOSE: 142 mg/dL — AB (ref 65–99)
Potassium: 3.9 mmol/L (ref 3.5–5.3)
SODIUM: 141 mmol/L (ref 135–146)
Total Bilirubin: 0.2 mg/dL (ref 0.2–1.2)
Total Protein: 6.6 g/dL (ref 6.1–8.1)

## 2018-06-07 LAB — TEST AUTHORIZATION

## 2018-06-07 LAB — CBC WITH DIFFERENTIAL/PLATELET
BASOS PCT: 0.4 %
Basophils Absolute: 29 cells/uL (ref 0–200)
EOS ABS: 234 {cells}/uL (ref 15–500)
Eosinophils Relative: 3.2 %
HEMATOCRIT: 39.6 % (ref 35.0–45.0)
HEMOGLOBIN: 13.2 g/dL (ref 11.7–15.5)
LYMPHS ABS: 3402 {cells}/uL (ref 850–3900)
MCH: 30.2 pg (ref 27.0–33.0)
MCHC: 33.3 g/dL (ref 32.0–36.0)
MCV: 90.6 fL (ref 80.0–100.0)
MPV: 10.2 fL (ref 7.5–12.5)
Monocytes Relative: 6.3 %
Neutro Abs: 3176 cells/uL (ref 1500–7800)
Neutrophils Relative %: 43.5 %
Platelets: 320 10*3/uL (ref 140–400)
RBC: 4.37 10*6/uL (ref 3.80–5.10)
RDW: 13.1 % (ref 11.0–15.0)
Total Lymphocyte: 46.6 %
WBC mixed population: 460 cells/uL (ref 200–950)
WBC: 7.3 10*3/uL (ref 3.8–10.8)

## 2018-06-07 LAB — FSH/LH
FSH: 18.7 m[IU]/mL
LH: 7.1 m[IU]/mL

## 2018-06-07 LAB — SEDIMENTATION RATE: Sed Rate: 2 mm/h (ref 0–30)

## 2018-06-07 LAB — HEMOGLOBIN A1C
Hgb A1c MFr Bld: 7.8 % of total Hgb — ABNORMAL HIGH (ref <5.7)
Mean Plasma Glucose: 177 (calc)
eAG (mmol/L): 9.8 (calc)

## 2018-06-07 LAB — C-REACTIVE PROTEIN: CRP: 6.1 mg/L (ref <8.0)

## 2018-06-07 LAB — PTH, INTACT AND CALCIUM
CALCIUM: 9.8 mg/dL (ref 8.6–10.4)
PTH: 50 pg/mL (ref 14–64)

## 2018-06-07 LAB — TSH: TSH: 0.49 mIU/L (ref 0.40–4.50)

## 2018-06-11 ENCOUNTER — Telehealth: Payer: Self-pay | Admitting: Nurse Practitioner

## 2018-06-11 DIAGNOSIS — R61 Generalized hyperhidrosis: Secondary | ICD-10-CM | POA: Diagnosis not present

## 2018-06-11 DIAGNOSIS — R51 Headache: Secondary | ICD-10-CM | POA: Diagnosis not present

## 2018-06-11 DIAGNOSIS — R002 Palpitations: Secondary | ICD-10-CM | POA: Diagnosis not present

## 2018-06-11 NOTE — Telephone Encounter (Signed)
Patient called, submitted urine lab collect this morning. Endorses mild headache, no other symptoms presently. States went home and took her blood pressure medicines after returning sampled. Discussed plan for restarting Effexor. Appreciative of call.

## 2018-06-15 LAB — VMA, URINE, 24 HOUR
CREATININE 24H URINE: 1.75 g/(24.h) (ref 0.50–2.15)
Vanillylmandelic Acid, (VMA): 5.2 mg/24 h (ref <=6.0)
Volume, Urine-VMAUR: 2075 mL

## 2018-06-15 LAB — METANEPHRINES, URINE, 24 HOUR
Metaneph Total, Ur: 997 mcg/24 h — ABNORMAL HIGH (ref 224–832)
Metanephrines, Ur: 212 mcg/24 h (ref 90–315)
NORMETANEPHRINE 24H UR: 785 ug/(24.h) — AB (ref 122–676)
VOLUME, URINE-VMAUR: 2075 mL

## 2018-06-15 LAB — CATECHOLAMINES, FRACTIONATED, URINE, 24 HOUR
CALCULATED TOTAL (E+ NE): 82 ug/(24.h) (ref 26–121)
Creatinine, Urine mg/day-CATEUR: 1.75 g/(24.h) (ref 0.50–2.15)
Dopamine, 24 hr Urine: 935 mcg/24 h — ABNORMAL HIGH (ref 52–480)
Norepinephrine, 24 hr Ur: 82 mcg/24 h (ref 15–100)
Volume, Urine-VMAUR: 2075 mL

## 2018-06-15 LAB — 5 HIAA, QUANTITATIVE, URINE, 24 HOUR
5-HIAA: 18.3 mg/24 h — ABNORMAL HIGH (ref <=6.0)
Volume, Urine-VMAUR: 2075 mL/24 h

## 2018-06-20 ENCOUNTER — Other Ambulatory Visit: Payer: Self-pay | Admitting: Nurse Practitioner

## 2018-06-20 DIAGNOSIS — R61 Generalized hyperhidrosis: Secondary | ICD-10-CM

## 2018-06-20 DIAGNOSIS — R4586 Emotional lability: Secondary | ICD-10-CM

## 2018-06-20 DIAGNOSIS — R002 Palpitations: Secondary | ICD-10-CM

## 2018-06-20 DIAGNOSIS — R899 Unspecified abnormal finding in specimens from other organs, systems and tissues: Secondary | ICD-10-CM

## 2018-06-25 DIAGNOSIS — R6889 Other general symptoms and signs: Secondary | ICD-10-CM | POA: Diagnosis not present

## 2018-06-25 DIAGNOSIS — I10 Essential (primary) hypertension: Secondary | ICD-10-CM | POA: Diagnosis not present

## 2018-06-25 DIAGNOSIS — E1142 Type 2 diabetes mellitus with diabetic polyneuropathy: Secondary | ICD-10-CM | POA: Diagnosis not present

## 2018-06-25 DIAGNOSIS — Z794 Long term (current) use of insulin: Secondary | ICD-10-CM | POA: Diagnosis not present

## 2018-06-25 DIAGNOSIS — E1159 Type 2 diabetes mellitus with other circulatory complications: Secondary | ICD-10-CM | POA: Diagnosis not present

## 2018-06-30 DIAGNOSIS — R6889 Other general symptoms and signs: Secondary | ICD-10-CM | POA: Insufficient documentation

## 2018-07-02 ENCOUNTER — Other Ambulatory Visit: Payer: Self-pay | Admitting: Family Medicine

## 2018-07-02 DIAGNOSIS — I209 Angina pectoris, unspecified: Secondary | ICD-10-CM

## 2018-07-02 DIAGNOSIS — K219 Gastro-esophageal reflux disease without esophagitis: Secondary | ICD-10-CM

## 2018-07-02 DIAGNOSIS — E785 Hyperlipidemia, unspecified: Secondary | ICD-10-CM

## 2018-07-09 ENCOUNTER — Encounter: Payer: Self-pay | Admitting: Family Medicine

## 2018-07-09 ENCOUNTER — Ambulatory Visit (INDEPENDENT_AMBULATORY_CARE_PROVIDER_SITE_OTHER): Payer: Medicare HMO | Admitting: Family Medicine

## 2018-07-09 VITALS — BP 124/70 | HR 72 | Temp 97.3°F | Resp 16 | Ht 65.0 in | Wt 225.1 lb

## 2018-07-09 DIAGNOSIS — F330 Major depressive disorder, recurrent, mild: Secondary | ICD-10-CM | POA: Diagnosis not present

## 2018-07-09 DIAGNOSIS — R61 Generalized hyperhidrosis: Secondary | ICD-10-CM

## 2018-07-09 DIAGNOSIS — K219 Gastro-esophageal reflux disease without esophagitis: Secondary | ICD-10-CM | POA: Diagnosis not present

## 2018-07-09 DIAGNOSIS — I10 Essential (primary) hypertension: Secondary | ICD-10-CM | POA: Diagnosis not present

## 2018-07-09 DIAGNOSIS — E785 Hyperlipidemia, unspecified: Secondary | ICD-10-CM | POA: Diagnosis not present

## 2018-07-09 DIAGNOSIS — I700 Atherosclerosis of aorta: Secondary | ICD-10-CM | POA: Insufficient documentation

## 2018-07-09 DIAGNOSIS — I209 Angina pectoris, unspecified: Secondary | ICD-10-CM | POA: Diagnosis not present

## 2018-07-09 DIAGNOSIS — I272 Pulmonary hypertension, unspecified: Secondary | ICD-10-CM

## 2018-07-09 DIAGNOSIS — G4733 Obstructive sleep apnea (adult) (pediatric): Secondary | ICD-10-CM

## 2018-07-09 DIAGNOSIS — M109 Gout, unspecified: Secondary | ICD-10-CM | POA: Diagnosis not present

## 2018-07-09 DIAGNOSIS — E1169 Type 2 diabetes mellitus with other specified complication: Secondary | ICD-10-CM

## 2018-07-09 MED ORDER — LISINOPRIL 20 MG PO TABS: 20.0000 mg | ORAL_TABLET | Freq: Every day | ORAL | 1 refills | Status: DC

## 2018-07-09 MED ORDER — METOPROLOL SUCCINATE ER 50 MG PO TB24: 50.0000 mg | ORAL_TABLET | Freq: Every day | ORAL | 3 refills | Status: DC

## 2018-07-09 MED ORDER — VENLAFAXINE HCL ER 75 MG PO CP24
ORAL_CAPSULE | ORAL | 1 refills | Status: DC
Start: 2018-07-09 — End: 2018-07-11

## 2018-07-09 MED ORDER — LISINOPRIL 40 MG PO TABS: 40.0000 mg | ORAL_TABLET | Freq: Every day | ORAL | 1 refills | Status: DC

## 2018-07-09 MED ORDER — CLONIDINE HCL 0.1 MG PO TABS: 0.1000 mg | ORAL_TABLET | Freq: Two times a day (BID) | ORAL | 0 refills | Status: DC | PRN

## 2018-07-09 MED ORDER — ALLOPURINOL 100 MG PO TABS: 100.0000 mg | ORAL_TABLET | Freq: Every day | ORAL | 3 refills | Status: DC

## 2018-07-09 NOTE — Progress Notes (Signed)
Name: Dana Sparks   MRN: [id number]    DOB: 05/06/1951   Date:07/09/2018       Progress Note  Subjective  Chief Complaint  Chief Complaint  Patient presents with  . Follow-up    6 mth f/u  . Hypertension  . Dyslipidemia  . Chest Pain  . Sleep Apnea  . Depression  . Obesity  . Gastroesophageal Reflux    HPI  DMII with gastroparesis/CKI: she is taking medication as prescribed, seeing Dr. Gabriel Carina now, she states occasionally has hypoglycemia at night, she states int he 67's, she gets up and eats - sometimes too much during the night.  Her hgbA1C  last hgbA1C 7.3 % , 7.7%  6.8% , 7.1%,7.3% last on 7.8%. She is up to date with eye exam. Dr. Gabriel Carina recently adjusted dose of NPH to 20 units  am ad 25 units at night. She has occasional episodes of polyphagia, but no polydipsia or polyuria  HTN: she denies dizziness, but bp isat goal,no chest pain or SOB at this time. She is compliant with medication,. Currently on half dose of lisinopril 40 mg, and wants to get 20 mg dose   Angina: CAD, her cardiologist is Dr. Clayborn Bigness, taking Imdur daily and Toprol XL and has chest pain once or twice a week, it lastsminutes and resolves by itself without SL nitroglycerin.She is also on statin therapy, aspirin and ace. She is on disability for it for many years, but now on social security.Still sees Dr. Clayborn Bigness, reviewed records and Echo from 2016 showed decreased in EF 25-30% and global hypokinesis, she has follow up with Dr. Clayborn Bigness 05/2018  OSA: she is using a CPAP machine most night, she needs to resume it every night.  She has mild pulmonary hypertension    Major Depression Mild: she is doing well on Effexor again, she was off medication and had severe emotional liability, back on medication for a few weeks and denies crying spells. Feeling well and does not want to stop medication  Morbid obesity:she gained about 4 lbs since last visit, she is trying to eat healthy, discussed importance  of physical activity.   Hyperlipidemia: taking statin therapy, due for repeat labs. No side effects of medication   Abnormal catecholamines on urine: seen by Dr. Gabriel Carina and given reassurance  Patient Active Problem List   Diagnosis Date Noted  . Aortic atherosclerosis ([place]) 07/09/2018  . Abnormal endocrine laboratory test finding 06/30/2018  . Type II or unspecified type diabetes mellitus with neurological manifestations, not stated as uncontrolled(250.60) 09/03/2017  . Nasal polyp 09/03/2017  . Lipoma of back 09/03/2017  . Claudication ([place]) 06/04/2017  . LVH (left ventricular hypertrophy) 02/27/2017  . Decreased cardiac ejection fraction 02/27/2017  . Left hand weakness 10/12/2016  . Elevated antinuclear antibody (ANA) level 04/27/2016  . Angina pectoris ([place]) 11/01/2015  . Well controlled type 2 diabetes mellitus with gastroparesis ([place]) 06/22/2015  . Low TSH level 06/22/2015  . Iron deficiency anemia 04/07/2015  . Intestinal metaplasia of gastric mucosa 03/11/2015  . CAD (coronary artery disease), native coronary artery 02/24/2015  . History of coronary artery stent placement 02/24/2015  . Chronic kidney disease, stage 3, mod decreased GFR (HCC) 02/24/2015  . Menopause 02/24/2015  . History of Helicobacter pylori infection 02/24/2015  . Mild mitral insufficiency 02/24/2015  . Mild tricuspid insufficiency 02/24/2015  . 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 ([place]) 02/24/2015  . Controlled gout 02/24/2015  . Depression, major, recurrent, mild ([place]) 02/24/2015  .  Morbid obesity due to excess calories ([place]) 02/24/2015  . Mild pulmonary hypertension ([place]) 02/24/2015  . Gastroesophageal reflux disease without esophagitis 02/24/2015  . Perennial allergic rhinitis 02/24/2015  . HLD (hyperlipidemia) 02/20/2015  . Hypertension, benign 02/20/2015  . Apnea, sleep 02/20/2015  . Controlled diabetes mellitus with stage 3 chronic kidney disease, without  long-term current use of insulin ([place]) 02/20/2015    Past Surgical History:  Procedure Laterality Date  . ABDOMINAL HYSTERECTOMY     total  . CATARACT EXTRACTION    . COLONOSCOPY  01/2014   sigmoid diverticulosis. desc colon TA, hyperplastic polyp  . CORONARY ANGIOPLASTY WITH STENT PLACEMENT    . ESOPHAGOGASTRODUODENOSCOPY N/A 02/16/2015   negative h pylori, focal gastic intestinal metaplasia  . TOTAL KNEE ARTHROPLASTY Right     Family History  Problem Relation Age of Onset  . Stroke Sister   . Hyperlipidemia Sister   . Alcohol abuse Brother   . Diabetes Brother   . Stroke Brother   . Hypertension Mother   . Diabetes Mother   . Heart disease Mother   . Diabetes Father   . Heart disease Father   . Hypertension Father   . Hypertension Sister   . Cancer Maternal Grandmother        Unsure  . Diabetes Maternal Grandfather   . Colon cancer Neg Hx   . Liver disease Neg Hx   . Breast cancer Neg Hx     Social History   Socioeconomic History  . Marital status: Married    Spouse name: Fritz Pickerel  . Number of children: 2  . Years of education: some college  . Highest education level: 12th grade  Occupational History  . Occupation: Disabled  Social Needs  . Financial resource strain: Hard  . Food insecurity:    Worry: Never true    Inability: Never true  . Transportation needs:    Medical: No    Non-medical: No  Tobacco Use  . Smoking status: Former Smoker    Packs/day: 1.00    Years: 40.00    Pack years: 40.00    Types: Cigarettes    Start date: 09/04/1972    Last attempt to quit: 12/30/2012    Years since quitting: 5.5  . Smokeless tobacco: Never Used  . Tobacco comment: smoking cessation materials not required  Substance and Sexual Activity  . Alcohol use: No    Alcohol/week: 0.0 standard drinks  . Drug use: No  . Sexual activity: Not Currently  Lifestyle  . Physical activity:    Days per week: 0 days    Minutes per session: 0 min  . Stress: Not at all   Relationships  . Social connections:    Talks on phone: More than three times a week    Gets together: More than three times a week    Attends religious service: More than 4 times per year    Active member of club or organization: Yes    Attends meetings of clubs or organizations: More than 4 times per year    Relationship status: Married  . Intimate partner violence:    Fear of current or ex partner: No    Emotionally abused: No    Physically abused: No    Forced sexual activity: No  Other Topics Concern  . Not on file  Social History Narrative   Married - current 32rd,    Two children from first marriage   He has 4 children from previous marriage  Current Outpatient Medications:  .  ACCU-CHEK FASTCLIX LANCETS MISC, , Disp: , Rfl:  .  ACCU-CHEK SMARTVIEW test strip, Check fsbs two times daily  DMII, Disp: 100 each, Rfl: 6 .  albuterol (PROVENTIL) (2.5 MG/3ML) 0.083% nebulizer solution, Take 3 mLs (2.5 mg total) by nebulization every 6 (six) hours as needed for wheezing or shortness of breath., Disp: 75 mL, Rfl: 0 .  Alcohol Swabs (B-D SINGLE USE SWABS REGULAR) PADS, 1 each by Does not apply route 4 (four) times daily., Disp: 200 each, Rfl: 2 .  allopurinol (ZYLOPRIM) 100 MG tablet, Take 1 tablet (100 mg total) by mouth daily., Disp: 90 tablet, Rfl: 3 .  aspirin EC 81 MG tablet, Take 81 mg by mouth every morning. , Disp: , Rfl:  .  BD INSULIN SYRINGE U/F 31G X 5/16" 0.3 ML MISC, , Disp: , Rfl:  .  blood glucose meter kit and supplies KIT, Dispense based on patient and insurance preference. Use up to four times daily as directed. (FOR ICD-9 250.00, 250.01)., Disp: 1 each, Rfl: 0 .  clindamycin (CLEOCIN) 150 MG capsule, Take 150 mg by mouth 4 (four) times daily. Take 4 tablets at once one hour before dental appointment., Disp: , Rfl:  .  cloNIDine (CATAPRES) 0.1 MG tablet, Take 1 tablet (0.1 mg total) by mouth 2 (two) times daily as needed. Start taking 1 tablet at bedtime for  sweating, may increase to twice daily as tolerated., Disp: 180 tablet, Rfl: 0 .  Coenzyme Q10 100 MG capsule, TAKE 1 CAPSULE EVERY DAY, Disp: 90 capsule, Rfl: 1 .  Ferrous Sulfate (IRON) 325 (65 Fe) MG TABS, Take 325 mg by mouth 2 (two) times daily. Nature made brand-  Pt takes one tablet twice a day , Disp: , Rfl:  .  fluticasone (FLONASE) 50 MCG/ACT nasal spray, Place 2 sprays into both nostrils daily., Disp: 48 g, Rfl: 2 .  glipiZIDE (GLUCOTROL XL) 10 MG 24 hr tablet, TAKE 1 TABLET EVERY DAY WITH BREAKFAST, Disp: 90 tablet, Rfl: 1 .  insulin NPH Human (HUMULIN N,NOVOLIN N) 100 UNIT/ML injection, Inject 20-25 Units into the skin 2 (two) times daily before a meal. 20 units in the morning and 25 units at bedtime , Disp: , Rfl:  .  ipratropium (ATROVENT) 0.03 % nasal spray, Place 2 sprays into both nostrils every 12 (twelve) hours., Disp: 1 mL, Rfl: 0 .  isosorbide mononitrate (IMDUR) 60 MG 24 hr tablet, TAKE 1 TABLET EVERY DAY, Disp: 90 tablet, Rfl: 1 .  lisinopril (PRINIVIL,ZESTRIL) 20 MG tablet, Take 1 tablet (20 mg total) by mouth daily., Disp: 90 tablet, Rfl: 1 .  loratadine (CLARITIN) 10 MG tablet, Take 1 tablet (10 mg total) by mouth daily., Disp: 30 tablet, Rfl: 3 .  meloxicam (MOBIC) 7.5 MG tablet, Take by mouth., Disp: , Rfl:  .  metoprolol succinate (TOPROL-XL) 50 MG 24 hr tablet, Take 1 tablet (50 mg total) by mouth daily., Disp: 90 tablet, Rfl: 3 .  montelukast (SINGULAIR) 10 MG tablet, Take 1 tablet by mouth every evening., Disp: , Rfl:  .  pantoprazole (PROTONIX) 40 MG tablet, TAKE 1 TABLET EVERY MORNING, Disp: 90 tablet, Rfl: 1 .  rosuvastatin (CRESTOR) 20 MG tablet, TAKE 1 TABLET EVERY DAY, Disp: 90 tablet, Rfl: 1 .  venlafaxine XR (EFFEXOR-XR) 75 MG 24 hr capsule, Take one tablet a day for 7 days then take 2 tablets a day for 7 days, Disp: 90 capsule, Rfl: 1  Allergies  Allergen Reactions  .  Codeine Other (See Comments)    Unknown reaction  . Contrast Media [Iodinated [employer]] Itching  . Sulfa Antibiotics Itching    I personally reviewed active problem list, medication list, allergies, family history, social history, health maintenance with the patient/caregiver today.   ROS  Constitutional: Negative for fever or weight change.  Respiratory: Negative for cough and shortness of breath.   Cardiovascular: Negative for chest pain or palpitations.  Gastrointestinal: Negative for abdominal pain, no bowel changes.  Musculoskeletal: Negative for gait problem or joint swelling.  Skin: Negative for rash.  Neurological: Negative for dizziness or headache.  No other specific complaints in a complete review of systems (except as listed in HPI above).  Objective  Vitals:   07/09/18 0916  BP: 124/70  Pulse: 72  Resp: 16  Temp: (!) 97.3 F (36.3 C)  TempSrc: Oral  SpO2: 99%  Weight: 225 lb 1.6 oz (102.1 kg)  Height: _0  (1.651 m)    Body mass index is 37.46 kg/m.  Physical Exam  Constitutional: Patient appears well-developed and well-nourished. Obese  No distress.  HEENT: head atraumatic, normocephalic, pupils equal and reactive to light, neck supple, throat within normal limits Cardiovascular: Normal rate, regular rhythm and normal heart sounds.  No murmur heard. No  BLE edema. Pulmonary/Chest: Effort normal and breath sounds normal. No respiratory distress. Abdominal: Soft.  There is no tenderness. Psychiatric: Patient has a normal mood and affect. behavior is normal. Judgment and thought content normal.  Recent Results (from the past 2160 hour(s))  PTH, Intact and Calcium     Status: None   Collection Time: 06/04/18  9:14 AM  Result Value Ref Range   PTH 50 14 - 64 pg/mL    Comment: . Interpretive Guide    Intact PTH           Calcium ------------------    ----------           ------- Normal Parathyroid    Normal               Normal Hypoparathyroidism    Low or Low Normal    Low Hyperparathyroidism    Primary            Normal or High        High    Secondary          High                 Normal or Low    Tertiary           High                 High Non-Parathyroid    Hypercalcemia      Low or Low Normal    High .    Calcium 9.8 8.6 - 10.4 mg/dL  TSH     Status: None   Collection Time: 06/04/18  9:14 AM  Result Value Ref Range   TSH 0.49 0.40 - 4.50 mIU/L  FSH/LH     Status: None   Collection Time: 06/04/18  9:14 AM  Result Value Ref Range   FSH 18.7 mIU/mL    Comment:                     Reference Range .              Follicular Phase       8.3-66.2  Mid-cycle Peak         3.1-17.7              Luteal Phase           1.5- 9.1              Postmenopausal       23.0-116.3              .    LH 7.1 mIU/mL    Comment:     Reference Range Follicular Phase  8.6-76.1 Mid-Cycle Peak    8.7-76.3 Luteal Phase      0.5-16.9 Postmenopausal    10.0-54.7   COMPLETE METABOLIC PANEL WITH GFR     Status: Abnormal   Collection Time: 06/04/18  9:14 AM  Result Value Ref Range   Glucose, Bld 142 (H) 65 - 99 mg/dL    Comment: .            Fasting reference interval . For someone without known diabetes, a glucose value >125 mg/dL indicates that they may have diabetes and this should be confirmed with a follow-up test. .    BUN 14 7 - 25 mg/dL   Creat 0.84 0.50 - 0.99 mg/dL    Comment: For patients >48 years of age, the reference limit for Creatinine is approximately 13% higher for people identified as African-American. .    GFR, Est Non African American 72 > OR = 60 mL/min/1.46m   GFR, Est African American 84 > OR = 60 mL/min/1.769m  BUN/Creatinine Ratio NOT APPLICABLE 6 - 22 (calc)   Sodium 141 135 - 146 mmol/L   Potassium 3.9 3.5 - 5.3 mmol/L   Chloride 104 98 - 110 mmol/L   CO2 25 20 - 32 mmol/L   Calcium 9.8 8.6 - 10.4 mg/dL   Total Protein 6.6 6.1 - 8.1 g/dL   Albumin 4.5 3.6 - 5.1 g/dL   Globulin 2.1 1.9 - 3.7 g/dL (calc)   AG Ratio 2.1 1.0 - 2.5 (calc)   Total Bilirubin 0.2 0.2 - 1.2  mg/dL   Alkaline phosphatase (APISO) 56 33 - 130 U/L   AST 16 10 - 35 U/L   ALT 11 6 - 29 U/L  CBC w/Diff/Platelet     Status: None   Collection Time: 06/04/18  9:14 AM  Result Value Ref Range   WBC 7.3 3.8 - 10.8 Thousand/uL   RBC 4.37 3.80 - 5.10 Million/uL   Hemoglobin 13.2 11.7 - 15.5 g/dL   HCT 39.6 35.0 - 45.0 %   MCV 90.6 80.0 - 100.0 fL   MCH 30.2 27.0 - 33.0 pg   MCHC 33.3 32.0 - 36.0 g/dL   RDW 13.1 11.0 - 15.0 %   Platelets 320 140 - 400 Thousand/uL   MPV 10.2 7.5 - 12.5 fL   Neutro Abs 3,176 1,500 - 7,800 cells/uL   Lymphs Abs 3,402 850 - 3,900 cells/uL   WBC mixed population 460 200 - 950 cells/uL   Eosinophils Absolute 234 15 - 500 cells/uL   Basophils Absolute 29 0 - 200 cells/uL   Neutrophils Relative % 43.5 %   Total Lymphocyte 46.6 %   Monocytes Relative 6.3 %   Eosinophils Relative 3.2 %   Basophils Relative 0.4 %  Sed Rate (ESR)     Status: None   Collection Time: 06/04/18  9:14 AM  Result Value Ref Range   Sed Rate 2 0 - 30 mm/h  C-reactive protein  Status: None   Collection Time: 06/04/18  9:14 AM  Result Value Ref Range   CRP 6.1 <8.0 mg/L  TEST AUTHORIZATION     Status: None   Collection Time: 06/04/18  9:14 AM  Result Value Ref Range   TEST NAME: C-REACTIVE PROTEIN    TEST CODE: [id number]    CLIENT CONTACT: ELIZABETH POULOSE    REPORT ALWAYS MESSAGE SIGNATURE      Comment: . The laboratory testing on this patient was verbally requested or confirmed by the ordering physician or his or her authorized representative after contact with an employee of [employer]. Federal regulations require that we maintain on file written authorization for all laboratory testing.  Accordingly we are asking that the ordering physician or his or her authorized representative sign a copy of this report and promptly return it to the client service representative. . . Signature:____________________________________________________ . Please fax this signed  page to [phone] or return it via your [employer] courier.   Hemoglobin A1c     Status: Abnormal   Collection Time: 06/04/18  9:14 AM  Result Value Ref Range   Hgb A1c MFr Bld 7.8 (H) <5.7 % of total Hgb    Comment: For someone without known diabetes, a hemoglobin A1c value of 6.5% or greater indicates that they may have  diabetes and this should be confirmed with a follow-up  test. . For someone with known diabetes, a value <7% indicates  that their diabetes is well controlled and a value  greater than or equal to 7% indicates suboptimal  control. A1c targets should be individualized based on  duration of diabetes, age, comorbid conditions, and  other considerations. . Currently, no consensus exists regarding use of hemoglobin A1c for diagnosis of diabetes for children. .    Mean Plasma Glucose 177 (calc)   eAG (mmol/L) 9.8 (calc)  TEST AUTHORIZATION 2     Status: None   Collection Time: 06/04/18  9:14 AM  Result Value Ref Range   TEST NAME: HEMOGLOBIN A1c WITH eAG    TEST CODE: [id number]    CLIENT CONTACT: ELIZABETH POULOSE    REPORT ALWAYS MESSAGE SIGNATURE      Comment: . The laboratory testing on this patient was verbally requested or confirmed by the ordering physician or his or her authorized representative after contact with an employee of [employer]. Federal regulations require that we maintain on file written authorization for all laboratory testing.  Accordingly we are asking that the ordering physician or his or her authorized representative sign a copy of this report and promptly return it to the client service representative. . . Signature:____________________________________________________ . Please fax this signed page to [phone] or return it via your [employer] courier.   Metanephrines, Urine, 24 hour     Status: Abnormal   Collection Time: 06/11/18  8:43 AM  Result Value Ref Range   Volume, Urine-VMAUR 2,075 mL    Metanephrines, Ur 212 90 - 315 mcg/24 h    Comment: . This test was developed and its analytical performance characteristics have been determined by [place], [place]. It has not been cleared or approved by the U.S. Food and Drug Administration. This assay has been validated pursuant to the CLIA regulations and is used for clinical purposes. .    Normetanephrine, 24H Ur 785 (H) 122 - 676 mcg/24 h    Comment: . This test was developed and its analytical performance characteristics have been determined by [employer]  [place], [place]. It has not been cleared or approved by the U.S. Food and Drug Administration. This assay has been validated pursuant to the CLIA regulations and is used for clinical purposes. Cathey Endow Total, Ur 997 (H) 224 - 832 mcg/24 h    Comment: . A four fold elevation of urinary normetanephrines is extremely likely to be due to a tumor, while a four fold elevation of urinary metanephrines is highly suggestive, but not diagnostic of the tumor. Measurement of plasma Metanephrines and Chromogranin A is recommended for confirmation. .   VMA, urine, 24 hour     Status: None   Collection Time: 06/11/18  8:43 AM  Result Value Ref Range   Volume, Urine-VMAUR 2,075 mL   Vanillylmandelic Acid, (VMA) 5.2 <=1.9 mg/24 h    Comment: . This test was developed and its analytical performance characteristics have been determined by [place], [place]. It has not been cleared or approved by the U.S. Food and Drug Administration. This assay has been validated pursuant to the CLIA regulations and is used for clinical purposes. .    Creatinine 24h urine 1.75 0.50 - 2.15 g/24 h  Catecholamines, fractionated, Urine, 24 hour     Status: Abnormal   Collection Time: 06/11/18  8:43 AM  Result Value Ref Range   Volume, Urine-VMAUR 2,075 mL   Epinephrine, 24 hr Urine see note     Comment:  Results are below the reportable range for this analyte, which is 2.0 mcg/L. Marland Kitchen This test was developed and its analytical performance characteristics have been determined by [place], [place]. It has not been cleared or approved by the U.S. Food and Drug Administration. This assay has been validated pursuant to the CLIA regulations and is used for clinical purposes. .    Norepinephrine, 24 hr Ur 82 15 - 100 mcg/24 h    Comment: . This test was developed and its analytical performance characteristics have been determined by [place], [place]. It has not been cleared or approved by the U.S. Food and Drug Administration. This assay has been validated pursuant to the CLIA regulations and is used for clinical purposes. .    Calculated Total (E+NE) 82 26 - 121 mcg/24 h    Comment: . This test was developed and its analytical performance characteristics have been determined by [place], [place]. It has not been cleared or approved by the U.S. Food and Drug Administration. This assay has been validated pursuant to the CLIA regulations and is used for clinical purposes. .    Dopamine, 24 hr Urine 935 (H) 52 - 480 mcg/24 h    Comment: . This test was developed and its analytical performance characteristics have been determined by [place], [place]. It has not been cleared or approved by the U.S. Food and Drug Administration. This assay has been validated pursuant to the CLIA regulations and is used for clinical purposes. .    Creatinine, Urine mg/day-CATEUR 1.75 0.50 - 2.15 g/24 h  5 HIAA, quantitative, urine, 24 hour     Status: Abnormal   Collection Time: 06/11/18  8:43 AM  Result Value Ref Range   Volume, Urine-VMAUR 2,075 mL/24 h   5-HIAA 18.3 (H) <=6.0 mg/24 h    Comment: . This test was developed and its analytical performance characteristics  have been determined by [place], [place]. It has not been cleared  or approved by the U.S. Food and Drug Administration. This assay has been validated pursuant to the CLIA regulations and is used for clinical purposes. .       PHQ2/9: Depression screen [facility] 2/9 07/09/2018 06/05/2018 06/04/2018 01/04/2018 01/04/2018  Decreased Interest 0 0 0 0 1  Down, Depressed, Hopeless 0 0 0 0 0  PHQ - 2 Score 0 0 0 0 1  Altered sleeping 0 0 - 0 0  Tired, decreased energy 0 0 - 0 1  Change in appetite 0 2 - 3 0  Feeling bad or failure about yourself  0 0 - 0 1  Trouble concentrating 0 0 - 0 0  Moving slowly or fidgety/restless 0 0 - 0 0  Suicidal thoughts 0 0 - 0 0  PHQ-9 Score 0 - - 3 3  Difficult doing work/chores Not difficult at all Not difficult at all - Not difficult at all -     Fall Risk: Fall Risk  07/09/2018 06/05/2018 06/04/2018 01/04/2018 01/04/2018  Falls in the past year? 1 Yes Yes No No  Number falls in past yr: _0 - -  Injury with Fall? 1 No No - -  Comment - - - - -  Risk for fall due to : History of fall(s) - - History of fall(s);Impaired vision -     Functional Status Survey: Is the patient deaf or have difficulty hearing?: No Does the patient have difficulty seeing, even when wearing glasses/contacts?: No Does the patient have difficulty concentrating, remembering, or making decisions?: No Does the patient have difficulty walking or climbing stairs?: Yes Does the patient have difficulty dressing or bathing?: No Does the patient have difficulty doing errands alone such as visiting a doctor's office or shopping?: No    Assessment & Plan  1. Dyslipidemia associated with type 2 diabetes mellitus ([place])  - Lipid panel  2. Hypertension, benign  She has been taking half dose of lisinopril and would like a rx with the correct dosage today  - metoprolol succinate (TOPROL-XL) 50 MG 24 hr tablet; Take 1 tablet (50 mg total) by mouth daily.   Dispense: 90 tablet; Refill: 3 - lisinopril (PRINIVIL,ZESTRIL) 20 MG tablet; Take 1 tablet (20 mg total) by mouth daily.  Dispense: 90 tablet; Refill: 1  3. Depression, major, recurrent, mild (HCC)  - venlafaxine XR (EFFEXOR-XR) 75 MG 24 hr capsule; Take one tablet a day for 7 days then take 2 tablets a day for 7 days  Dispense: 90 capsule; Refill: 1  4. Excessive sweating  - cloNIDine (CATAPRES) 0.1 MG tablet; Take 1 tablet (0.1 mg total) by mouth 2 (two) times daily as needed. Start taking 1 tablet at bedtime for sweating, may increase to twice daily as tolerated.  Dispense: 180 tablet; Refill: 0  Had abnormal hormone levels, but seen by Dr. Gabriel Carina and given reassurance, not high enough to be pheo, episodes of sweating and anxiety likely from stopping Effexor, back to normal when she resumed medication  5. Controlled gout  - allopurinol (ZYLOPRIM) 100 MG tablet; Take 1 tablet (100 mg total) by mouth daily.  Dispense: 90 tablet; Refill: 3  6. Angina pectoris ([place])   7. Gastroesophageal reflux disease without esophagitis  Controlled   8. Mild pulmonary hypertension ([place])  Discussed importance of compliance with CPAP    9. Obstructive sleep apnea syndrome  Needs to use CPAP daily   10. Morbid obesity due to excess calories [facility])  Discussed with the patient the risk  posed by an increased BMI. Discussed importance of portion control, calorie counting and at least 150 minutes of physical activity weekly. Avoid sweet beverages and drink more water. Eat at least 6 servings of fruit and vegetables daily

## 2018-07-10 LAB — LIPID PANEL
CHOL/HDL RATIO: 2.5 (calc) (ref <5.0)
Cholesterol: 126 mg/dL (ref <200)
HDL: 50 mg/dL — AB (ref >50)
LDL Cholesterol (Calc): 62 mg/dL (calc)
NON-HDL CHOLESTEROL (CALC): 76 mg/dL (ref <130)
TRIGLYCERIDES: 65 mg/dL (ref <150)

## 2018-07-11 ENCOUNTER — Telehealth: Payer: Self-pay | Admitting: Family Medicine

## 2018-07-11 DIAGNOSIS — F330 Major depressive disorder, recurrent, mild: Secondary | ICD-10-CM

## 2018-07-11 MED ORDER — VENLAFAXINE HCL ER 75 MG PO CP24: ORAL_CAPSULE | ORAL | 1 refills | Status: DC

## 2018-07-11 NOTE — Addendum Note (Signed)
Addended by: Saunders Glance A on: 07/11/2018 11:54 AM   Modules accepted: Orders

## 2018-07-11 NOTE — Telephone Encounter (Signed)
Copied from [place] [id number]. Topic: Quick Communication - Rx Refill/Question >> Jul 11, 2018 11:39 AM Sheran Luz wrote: Medication: venlafaxine XR (EFFEXOR-XR) 75 MG 24 hr capsule  Diane with [employer] calling to get clarification on this medication stating she needs to know quantity and directions.   Call back# [phone]

## 2018-07-11 NOTE — Telephone Encounter (Signed)
Mark with [facility] calling and would like to speak with someone regarding the patient's venlafaxine XR (EFFEXOR-XR) 75 MG 24 hr capsule. States that he is needing clarification on the medications. States that he has 3 prescriptions and needs to know which one(s) the patient is supposed to be taking. Please advise.  CB#: [phone]

## 2018-07-12 NOTE — Telephone Encounter (Signed)
Clarity was given of 1 capsule daily per Dr. Steele Sizer.

## 2018-07-15 ENCOUNTER — Emergency Department
Admission: EM | Admit: 2018-07-15 | Discharge: 2018-07-15 | Disposition: A | Discharge status: Home / Self Care | Payer: Medicare HMO | Attending: Emergency Medicine | Admitting: Emergency Medicine

## 2018-07-15 ENCOUNTER — Emergency Department: Payer: Medicare HMO

## 2018-07-15 ENCOUNTER — Encounter: Payer: Self-pay | Admitting: Emergency Medicine

## 2018-07-15 DIAGNOSIS — N183 Chronic kidney disease, stage 3 (moderate): Secondary | ICD-10-CM | POA: Diagnosis not present

## 2018-07-15 DIAGNOSIS — Z7982 Long term (current) use of aspirin: Secondary | ICD-10-CM | POA: Diagnosis not present

## 2018-07-15 DIAGNOSIS — K5792 Diverticulitis of intestine, part unspecified, without perforation or abscess without bleeding: Secondary | ICD-10-CM

## 2018-07-15 DIAGNOSIS — Z87891 Personal history of nicotine dependence: Secondary | ICD-10-CM | POA: Diagnosis not present

## 2018-07-15 DIAGNOSIS — K5732 Diverticulitis of large intestine without perforation or abscess without bleeding: Secondary | ICD-10-CM | POA: Diagnosis not present

## 2018-07-15 DIAGNOSIS — B351 Tinea unguium: Secondary | ICD-10-CM | POA: Diagnosis not present

## 2018-07-15 DIAGNOSIS — I2510 Atherosclerotic heart disease of native coronary artery without angina pectoris: Secondary | ICD-10-CM | POA: Insufficient documentation

## 2018-07-15 DIAGNOSIS — E1142 Type 2 diabetes mellitus with diabetic polyneuropathy: Secondary | ICD-10-CM | POA: Diagnosis not present

## 2018-07-15 DIAGNOSIS — I129 Hypertensive chronic kidney disease with stage 1 through stage 4 chronic kidney disease, or unspecified chronic kidney disease: Secondary | ICD-10-CM | POA: Diagnosis not present

## 2018-07-15 DIAGNOSIS — J449 Chronic obstructive pulmonary disease, unspecified: Secondary | ICD-10-CM | POA: Diagnosis not present

## 2018-07-15 DIAGNOSIS — Z794 Long term (current) use of insulin: Secondary | ICD-10-CM | POA: Diagnosis not present

## 2018-07-15 DIAGNOSIS — R030 Elevated blood-pressure reading, without diagnosis of hypertension: Secondary | ICD-10-CM | POA: Diagnosis not present

## 2018-07-15 DIAGNOSIS — E1122 Type 2 diabetes mellitus with diabetic chronic kidney disease: Secondary | ICD-10-CM | POA: Insufficient documentation

## 2018-07-15 DIAGNOSIS — Z79899 Other long term (current) drug therapy: Secondary | ICD-10-CM | POA: Diagnosis not present

## 2018-07-15 DIAGNOSIS — R1084 Generalized abdominal pain: Secondary | ICD-10-CM | POA: Diagnosis not present

## 2018-07-15 DIAGNOSIS — L851 Acquired keratosis [keratoderma] palmaris et plantaris: Secondary | ICD-10-CM | POA: Diagnosis not present

## 2018-07-15 DIAGNOSIS — K5730 Diverticulosis of large intestine without perforation or abscess without bleeding: Secondary | ICD-10-CM | POA: Diagnosis not present

## 2018-07-15 DIAGNOSIS — E785 Hyperlipidemia, unspecified: Secondary | ICD-10-CM | POA: Insufficient documentation

## 2018-07-15 LAB — COMPREHENSIVE METABOLIC PANEL
ALT: 13 U/L (ref 0–44)
ANION GAP: 11 (ref 5–15)
AST: 15 U/L (ref 15–41)
Albumin: 4 g/dL (ref 3.5–5.0)
Alkaline Phosphatase: 58 U/L (ref 38–126)
BUN: 13 mg/dL (ref 8–23)
CHLORIDE: 99 mmol/L (ref 98–111)
CO2: 28 mmol/L (ref 22–32)
Calcium: 8.9 mg/dL (ref 8.9–10.3)
Creatinine, Ser: 0.68 mg/dL (ref 0.44–1.00)
GFR calc Af Amer: >60 mL/min (ref >60)
GFR calc non Af Amer: >60 mL/min (ref >60)
Glucose, Bld: 100 mg/dL — ABNORMAL HIGH (ref 70–99)
POTASSIUM: 3.4 mmol/L — AB (ref 3.5–5.1)
Sodium: 138 mmol/L (ref 135–145)
TOTAL PROTEIN: 7.1 g/dL (ref 6.5–8.1)
Total Bilirubin: 0.6 mg/dL (ref 0.3–1.2)

## 2018-07-15 LAB — LIPASE, BLOOD: LIPASE: 25 U/L (ref 11–51)

## 2018-07-15 LAB — CBC
HEMATOCRIT: 38.8 % (ref 36.0–46.0)
HEMOGLOBIN: 12.8 g/dL (ref 12.0–15.0)
MCH: 31 pg (ref 26.0–34.0)
MCHC: 33 g/dL (ref 30.0–36.0)
MCV: 93.9 fL (ref 80.0–100.0)
NRBC: 0 % (ref 0.0–0.2)
Platelets: 275 10*3/uL (ref 150–400)
RBC: 4.13 MIL/uL (ref 3.87–5.11)
RDW: 12.8 % (ref 11.5–15.5)
WBC: 8.6 10*3/uL (ref 4.0–10.5)

## 2018-07-15 LAB — URINALYSIS, COMPLETE (UACMP) WITH MICROSCOPIC
BACTERIA UA: NONE SEEN
BILIRUBIN URINE: NEGATIVE
GLUCOSE, UA: NEGATIVE mg/dL
HGB URINE DIPSTICK: NEGATIVE
Ketones, ur: 5 mg/dL — AB
LEUKOCYTES UA: NEGATIVE
NITRITE: NEGATIVE
PROTEIN: 30 mg/dL — AB
Specific Gravity, Urine: 1.025 (ref 1.005–1.030)
pH: 6 (ref 5.0–8.0)

## 2018-07-15 MED ORDER — DICYCLOMINE HCL 10 MG PO CAPS
20.0000 mg | ORAL_CAPSULE | Freq: Once | ORAL | Status: AC
  Administered 2018-07-15: 20 mg via ORAL
  Filled 2018-07-15: qty 2

## 2018-07-15 MED ORDER — AMOXICILLIN-POT CLAVULANATE 875-125 MG PO TABS: 1.0000 | ORAL_TABLET | Freq: Three times a day (TID) | ORAL | 0 refills | Status: DC

## 2018-07-15 MED ORDER — DICYCLOMINE HCL 20 MG PO TABS: 20.0000 mg | ORAL_TABLET | Freq: Three times a day (TID) | ORAL | 0 refills | Status: DC | PRN

## 2018-07-15 MED ORDER — AMOXICILLIN-POT CLAVULANATE 875-125 MG PO TABS: 1.0000 | ORAL_TABLET | Freq: Three times a day (TID) | ORAL | 0 refills | Status: AC

## 2018-07-15 MED ORDER — FLUCONAZOLE 150 MG PO TABS: 150.0000 mg | ORAL_TABLET | Freq: Once | ORAL | 1 refills | Status: AC

## 2018-07-15 NOTE — ED Triage Notes (Signed)
Patient presents to the ED with lower abdominal pain x 3 days.  Patient also reports hypertension today.  Patient states she takes medication for hypertension.  Earlier today blood pressure was 178/108 per patient.  Patient denies vomiting and diarrhea.  Patient states, "I feel like there is a knot in my stomach."  Patient denies dizziness and blurry vision, chest pain and shortness of breath.

## 2018-07-15 NOTE — ED Notes (Addendum)
Pt reports abdominal pain for 3 days. She went to the doctor and had high BP and was also concerned for that. Reports 790 systolic at office. Pt reports nausea but no vomiting or diarrhea. Last BM this morning normal. Slight "knot" in abdomen upon palpation.

## 2018-07-15 NOTE — ED Provider Notes (Signed)
[facility] Emergency Department Provider Note   ____________________________________________   I have reviewed the triage vital signs and the nursing notes.   HISTORY  Chief Complaint Abdominal Pain   History limited by: Not Limited   HPI Dana Sparks is a 67 y.o. female who presents to the emergency department today because of concern for abdominal pain. The patient states the pain started three days ago. Developed gradually over the course of a day. The patient states that since it started it has continued. The patient has the sensation that there is a knot in her stomach. She has not had any vomiting. No change in bowel movements. She denies any fevers.    Per medical record review patient has a history of HTN, HLD, positive h pylori test, DM, diverticulitis.   Past Medical History:  Diagnosis Date  . Allergy   . Anxiety   . Arthritis   . CAD (coronary artery disease)   . COPD (chronic obstructive pulmonary disease) ([place])   . Depression   . Diabetes mellitus without complication ([place])   . Diverticulitis   . Gastroparesis   . Gout   . Hyperlipemia   . Hypertension   . Positive H. pylori test   . Renal insufficiency   . Sleep apnea   . Thrombocytosis ([place]) 01/28/2015  . Tubular adenoma of colon 01/20/14    Patient Active Problem List   Diagnosis Date Noted  . Aortic atherosclerosis ([place]) 07/09/2018  . Abnormal endocrine laboratory test finding 06/30/2018  . Type II or unspecified type diabetes mellitus with neurological manifestations, not stated as uncontrolled(250.60) 09/03/2017  . Nasal polyp 09/03/2017  . Lipoma of back 09/03/2017  . Claudication ([place]) 06/04/2017  . LVH (left ventricular hypertrophy) 02/27/2017  . Decreased cardiac ejection fraction 02/27/2017  . Left hand weakness 10/12/2016  . Elevated antinuclear antibody (ANA) level 04/27/2016  . Angina pectoris ([place]) 11/01/2015  . Well controlled type 2 diabetes mellitus with  gastroparesis ([employer]) 06/22/2015  . Low TSH level 06/22/2015  . Iron deficiency anemia 04/07/2015  . Intestinal metaplasia of gastric mucosa 03/11/2015  . CAD (coronary artery disease), native coronary artery 02/24/2015  . History of coronary artery stent placement 02/24/2015  . Chronic kidney disease, stage 3, mod decreased GFR ([facility]) 02/24/2015  . Menopause 02/24/2015  . History of Helicobacter pylori infection 02/24/2015  . Mild mitral insufficiency 02/24/2015  . Mild tricuspid insufficiency 02/24/2015  . 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 ([place]) 02/24/2015  . Controlled gout 02/24/2015  . Depression, major, recurrent, mild ([place]) 02/24/2015  . Morbid obesity due to excess calories ([place]) 02/24/2015  . Mild pulmonary hypertension ([place]) 02/24/2015  . Gastroesophageal reflux disease without esophagitis 02/24/2015  . Perennial allergic rhinitis 02/24/2015  . HLD (hyperlipidemia) 02/20/2015  . Hypertension, benign 02/20/2015  . Apnea, sleep 02/20/2015  . Controlled diabetes mellitus with stage 3 chronic kidney disease, without long-term current use of insulin ([place]) 02/20/2015    Past Surgical History:  Procedure Laterality Date  . ABDOMINAL HYSTERECTOMY     total  . CATARACT EXTRACTION    . COLONOSCOPY  01/2014   sigmoid diverticulosis. desc colon TA, hyperplastic polyp  . CORONARY ANGIOPLASTY WITH STENT PLACEMENT    . ESOPHAGOGASTRODUODENOSCOPY N/A 02/16/2015   negative h pylori, focal gastic intestinal metaplasia  . TOTAL KNEE ARTHROPLASTY Right     Prior to Admission medications   Medication Sig Start Date End Date Taking? Authorizing Provider  ACCU-CHEK FASTCLIX LANCETS [place]  06/06/16   [provider]  ACCU-CHEK SMARTVIEW test strip Check fsbs two times daily  DMII 05/18/17   Steele Sizer, MD  albuterol (PROVENTIL) (2.5 MG/3ML) 0.083% nebulizer solution Take 3 mLs (2.5 mg total) by nebulization every 6 (six) hours as needed for wheezing  or shortness of breath. 11/01/17   Hubbard Hartshorn, FNP  Alcohol Swabs (B-D SINGLE USE SWABS REGULAR) PADS 1 each by Does not apply route 4 (four) times daily. 06/11/16   Steele Sizer, MD  allopurinol (ZYLOPRIM) 100 MG tablet Take 1 tablet (100 mg total) by mouth daily. 07/09/18   Steele Sizer, MD  aspirin EC 81 MG tablet Take 81 mg by mouth every morning.     [provider]  BD INSULIN SYRINGE U/F 31G X 5/16" 0.3 ML MISC  12/12/17   [provider]  blood glucose meter kit and supplies KIT Dispense based on patient and insurance preference. Use up to four times daily as directed. (FOR ICD-9 250.00, 250.01). 05/02/16   Gladstone Lighter, MD  clindamycin (CLEOCIN) 150 MG capsule Take 150 mg by mouth 4 (four) times daily. Take 4 tablets at once one hour before dental appointment.    [provider]  cloNIDine (CATAPRES) 0.1 MG tablet Take 1 tablet (0.1 mg total) by mouth 2 (two) times daily as needed. Start taking 1 tablet at bedtime for sweating, may increase to twice daily as tolerated. 07/09/18   Steele Sizer, MD  Coenzyme Q10 100 MG capsule TAKE 1 CAPSULE EVERY DAY 03/17/18   Steele Sizer, MD  Ferrous Sulfate (IRON) 325 (65 Fe) MG TABS Take 325 mg by mouth 2 (two) times daily. Nature made brand-  Pt takes one tablet twice a day     [provider]  fluticasone (FLONASE) 50 MCG/ACT nasal spray Place 2 sprays into both nostrils daily. 08/20/17   Sowles, Drue Stager, MD  glipiZIDE (GLUCOTROL XL) 10 MG 24 hr tablet TAKE 1 TABLET EVERY DAY WITH BREAKFAST 01/04/17   Sowles, Drue Stager, MD  insulin NPH Human (HUMULIN N,NOVOLIN N) 100 UNIT/ML injection Inject 20-25 Units into the skin 2 (two) times daily before a meal. 20 units in the morning and 25 units at bedtime     Solum, Betsey Holiday, MD  ipratropium (ATROVENT) 0.03 % nasal spray Place 2 sprays into both nostrils every 12 (twelve) hours. 03/15/18   Poulose, Bethel Born, NP  isosorbide mononitrate (IMDUR) 60 MG 24 hr tablet  TAKE 1 TABLET EVERY DAY 07/02/18   Ancil Boozer, Drue Stager, MD  lisinopril (PRINIVIL,ZESTRIL) 20 MG tablet Take 1 tablet (20 mg total) by mouth daily. 07/09/18   Steele Sizer, MD  loratadine (CLARITIN) 10 MG tablet Take 1 tablet (10 mg total) by mouth daily. 03/15/18   Poulose, Bethel Born, NP  meloxicam (MOBIC) 7.5 MG tablet Take by mouth. 02/20/17   [provider]  metoprolol succinate (TOPROL-XL) 50 MG 24 hr tablet Take 1 tablet (50 mg total) by mouth daily. 07/09/18   Steele Sizer, MD  montelukast (SINGULAIR) 10 MG tablet Take 1 tablet by mouth every evening. 09/28/17   Carloyn Manner, MD  pantoprazole (PROTONIX) 40 MG tablet TAKE 1 TABLET EVERY MORNING 07/02/18   Ancil Boozer, Drue Stager, MD  rosuvastatin (CRESTOR) 20 MG tablet TAKE 1 TABLET EVERY DAY 07/02/18   Steele Sizer, MD  venlafaxine XR (EFFEXOR-XR) 75 MG 24 hr capsule Take one tablet a day. 07/11/18   Steele Sizer, MD    Allergies Codeine; Contrast media [iodinated diagnostic agents]; and Sulfa antibiotics  Family History  Problem Relation Age of Onset  . Stroke Sister   . Hyperlipidemia Sister   . Alcohol abuse Brother   . Diabetes Brother   . Stroke Brother   . Hypertension Mother   . Diabetes Mother   . Heart disease Mother   . Diabetes Father   . Heart disease Father   . Hypertension Father   . Hypertension Sister   . Cancer Maternal Grandmother        Unsure  . Diabetes Maternal Grandfather   . Colon cancer Neg Hx   . Liver disease Neg Hx   . Breast cancer Neg Hx     Social History Social History   Tobacco Use  . Smoking status: Former Smoker    Packs/day: 1.00    Years: 40.00    Pack years: 40.00    Types: Cigarettes    Start date: 09/04/1972    Last attempt to quit: 12/30/2012    Years since quitting: 5.5  . Smokeless tobacco: Never Used  . Tobacco comment: smoking cessation materials not required  Substance Use Topics  . Alcohol use: No    Alcohol/week: 0.0 standard drinks  . Drug use: No     Review of Systems Constitutional: No fever/chills Eyes: No visual changes. ENT: No sore throat. Cardiovascular: Denies chest pain. Respiratory: Denies shortness of breath. Gastrointestinal: Positive for abdominal pain.  Genitourinary: Negative for dysuria. Musculoskeletal: Negative for back pain. Skin: Negative for rash. Neurological: Negative for headaches, focal weakness or numbness.  ____________________________________________   PHYSICAL EXAM:  VITAL SIGNS: ED Triage Vitals  Enc Vitals Group     BP 07/15/18 1222 111/63     Pulse Rate 07/15/18 1222 79     Resp 07/15/18 1222 18     Temp 07/15/18 1222 97.8 F (36.6 C)     Temp Source 07/15/18 1222 Oral     SpO2 07/15/18 1222 95 %     Weight 07/15/18 1223 236 lb (107 kg)     Height 07/15/18 1223 5' 5.5" (1.664 m)     Head Circumference --      Peak Flow --      Pain Score 07/15/18 1222 8   Constitutional: Alert and oriented.  Eyes: Conjunctivae are normal.  ENT      Head: Normocephalic and atraumatic.      Nose: No congestion/rhinnorhea.      Mouth/Throat: Mucous membranes are moist.      Neck: No stridor. Hematological/Lymphatic/Immunilogical: No cervical lymphadenopathy. Cardiovascular: Normal rate, regular rhythm.  No murmurs, rubs, or gallops.  Respiratory: Normal respiratory effort without tachypnea nor retractions. Breath sounds are clear and equal bilaterally. No wheezes/rales/rhonchi. Gastrointestinal: Soft and diffusely tender to palpation. No rebound. No guarding.   Genitourinary: Deferred Musculoskeletal: Normal range of motion in all extremities. No lower extremity edema. Neurologic:  Normal speech and language. No gross focal neurologic deficits are appreciated.  Skin:  Skin is warm, dry and intact. No rash noted. Psychiatric: Mood and affect are normal. Speech and behavior are normal. Patient exhibits appropriate insight and judgment.  ____________________________________________    LABS  (pertinent positives/negatives)  CBC wbc 8.6, hgb 12.8, plt 275 CMP wnl except k 3.4, glu 100 Lipase 25 UA clear, ketones 5, protein 30, rbc and wbc 0-5  ____________________________________________   EKG  None  ____________________________________________    RADIOLOGY  CT abd/pel Findings concerning for mild diverticulitis  ____________________________________________   PROCEDURES  Procedures  ____________________________________________   INITIAL IMPRESSION / ASSESSMENT AND  PLAN / ED COURSE  Pertinent labs & imaging results that were available during my care of the patient were reviewed by me and considered in my medical decision making (see chart for details).   Resented to the emergency department today because of concerns for abdominal pain. Differential would be broad including appendicitis, diverticulitis, gastroenteritis as other etiologies. Patients blood work without concerning leukocytosis however she did not feel significantly better after medication. Decision to perform CT scan was made and this did come back concerning for possible mild diverticulitis. Discussed this with the patient. Will start antibiotics.   ____________________________________________   FINAL CLINICAL IMPRESSION(S) / ED DIAGNOSES  Final diagnoses:  Generalized abdominal pain  Diverticulitis     Note: This dictation was prepared with Dragon dictation. Any transcriptional errors that result from this process are unintentional     Nance Pear, MD 07/15/18 1752

## 2018-07-15 NOTE — Discharge Instructions (Addendum)
Please seek medical attention for any high fevers, chest pain, shortness of breath, change in behavior, persistent vomiting, bloody stool or any other new or concerning symptoms.  

## 2018-07-22 ENCOUNTER — Ambulatory Visit: Payer: Medicare HMO | Admitting: Family Medicine

## 2018-07-23 ENCOUNTER — Ambulatory Visit (INDEPENDENT_AMBULATORY_CARE_PROVIDER_SITE_OTHER): Payer: Medicare HMO | Admitting: Family Medicine

## 2018-07-23 ENCOUNTER — Ambulatory Visit: Payer: Medicare HMO | Admitting: Family Medicine

## 2018-07-23 ENCOUNTER — Encounter: Payer: Self-pay | Admitting: Family Medicine

## 2018-07-23 VITALS — BP 128/70 | HR 86 | Temp 97.6°F | Resp 16 | Ht 65.0 in | Wt 220.8 lb

## 2018-07-23 DIAGNOSIS — R160 Hepatomegaly, not elsewhere classified: Secondary | ICD-10-CM | POA: Diagnosis not present

## 2018-07-23 DIAGNOSIS — K5732 Diverticulitis of large intestine without perforation or abscess without bleeding: Secondary | ICD-10-CM

## 2018-07-23 DIAGNOSIS — E1143 Type 2 diabetes mellitus with diabetic autonomic (poly)neuropathy: Secondary | ICD-10-CM

## 2018-07-23 DIAGNOSIS — K760 Fatty (change of) liver, not elsewhere classified: Secondary | ICD-10-CM | POA: Diagnosis not present

## 2018-07-23 NOTE — Progress Notes (Signed)
Name: Dana Sparks   MRN: [id number]    DOB: 1951/03/20   Date:07/23/2018       Progress Note  Subjective  Chief Complaint  Chief Complaint  Patient presents with  . Follow-up    abdominal pain seen in ER    HPI  Diverticulitis: she sates she woke up in the middle of night on the on 07/15/2018 with cramping pain on periumbilical area. She states it was not associated with fever, chills, nausea, vomiting or change in bowel movements. She walked in to our office, but we were busy, so she was sent to Urgent care and from there to [facility], where she was found to have diverticulitis on CT abdomen. She was given Augmentin and Bentyl and discharged home. She states pain resolved within 2 days of antibiotics. Discussed results of CT with patient, her liver was 21 cm long and fatty liver, aorta atherosclerosis.   DMII: she states glucose is not spiking, taking medication. Sugar at home has been 140's  Patient Active Problem List   Diagnosis Date Noted  . Hepatomegaly 07/23/2018  . Fatty liver 07/23/2018  . Aortic atherosclerosis ([place]) 07/09/2018  . Abnormal endocrine laboratory test finding 06/30/2018  . Type II or unspecified type diabetes mellitus with neurological manifestations, not stated as uncontrolled(250.60) 09/03/2017  . Nasal polyp 09/03/2017  . Lipoma of back 09/03/2017  . Claudication ([place]) 06/04/2017  . LVH (left ventricular hypertrophy) 02/27/2017  . Decreased cardiac ejection fraction 02/27/2017  . Left hand weakness 10/12/2016  . Elevated antinuclear antibody (ANA) level 04/27/2016  . Angina pectoris ([place]) 11/01/2015  . Well controlled type 2 diabetes mellitus with gastroparesis ([place]) 06/22/2015  . Low TSH level 06/22/2015  . Iron deficiency anemia 04/07/2015  . Intestinal metaplasia of gastric mucosa 03/11/2015  . CAD (coronary artery disease), native coronary artery 02/24/2015  . History of coronary artery stent placement 02/24/2015  . Chronic kidney disease, stage 3, mod  decreased GFR (HCC) 02/24/2015  . Menopause 02/24/2015  . History of Helicobacter pylori infection 02/24/2015  . Mild mitral insufficiency 02/24/2015  . Mild tricuspid insufficiency 02/24/2015  . 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 Controlled gout 02/24/2015  . Depression, major, recurrent, mild ([place]) 02/24/2015  . Morbid obesity due to excess calories ([place]) 02/24/2015  . Mild pulmonary hypertension ([place]) 02/24/2015  . Gastroesophageal reflux disease without esophagitis 02/24/2015  . Perennial allergic rhinitis 02/24/2015  . HLD (hyperlipidemia) 02/20/2015  . Hypertension, benign 02/20/2015  . Apnea, sleep 02/20/2015  . Controlled diabetes mellitus with stage 3 chronic kidney disease, without long-term current use of insulin ([place]) 02/20/2015    Past Surgical History:  Procedure Laterality Date  . ABDOMINAL HYSTERECTOMY     total  . CATARACT EXTRACTION    . COLONOSCOPY  01/2014   sigmoid diverticulosis. desc colon TA, hyperplastic polyp  . CORONARY ANGIOPLASTY WITH STENT PLACEMENT    . ESOPHAGOGASTRODUODENOSCOPY N/A 02/16/2015   negative h pylori, focal gastic intestinal metaplasia  . TOTAL KNEE ARTHROPLASTY Right     Family History  Problem Relation Age of Onset  . Stroke Sister   . Hyperlipidemia Sister   . Alcohol abuse Brother   . Diabetes Brother   . Stroke Brother   . Hypertension Mother   . Diabetes Mother   . Heart disease Mother   . Diabetes Father   . Heart disease Father   . Hypertension Father   . Hypertension Sister   . Cancer Maternal Grandmother  Unsure  . Diabetes Maternal Grandfather   . Colon cancer Neg Hx   . Liver disease Neg Hx   . Breast cancer Neg Hx     Social History   Socioeconomic History  . Marital status: Married    Spouse name: Fritz Pickerel  . Number of children: 2  . Years of education: some college  . Highest education level: 12th grade  Occupational History  . Occupation:  Disabled  Social Needs  . Financial resource strain: Hard  . Food insecurity:    Worry: Never true    Inability: Never true  . Transportation needs:    Medical: No    Non-medical: No  Tobacco Use  . Smoking status: Former Smoker    Packs/day: 1.00    Years: 40.00    Pack years: 40.00    Types: Cigarettes    Start date: 09/04/1972    Last attempt to quit: 12/30/2012    Years since quitting: 5.5  . Smokeless tobacco: Never Used  . Tobacco comment: smoking cessation materials not required  Substance and Sexual Activity  . Alcohol use: No    Alcohol/week: 0.0 standard drinks  . Drug use: No  . Sexual activity: Not Currently  Lifestyle  . Physical activity:    Days per week: 0 days    Minutes per session: 0 min  . Stress: Not at all  Relationships  . Social connections:    Talks on phone: More than three times a week    Gets together: More than three times a week    Attends religious service: More than 4 times per year    Active member of club or organization: Yes    Attends meetings of clubs or organizations: More than 4 times per year    Relationship status: Married  . Intimate partner violence:    Fear of current or ex partner: No    Emotionally abused: No    Physically abused: No    Forced sexual activity: No  Other Topics Concern  . Not on file  Social History Narrative   Married - current 32rd,    Two children from first marriage   He has 4 children from previous marriage     Current Outpatient Medications:  .  ACCU-CHEK FASTCLIX LANCETS MISC, , Disp: , Rfl:  .  ACCU-CHEK SMARTVIEW test strip, Check fsbs two times daily  DMII, Disp: 100 each, Rfl: 6 .  albuterol (PROVENTIL) (2.5 MG/3ML) 0.083% nebulizer solution, Take 3 mLs (2.5 mg total) by nebulization every 6 (six) hours as needed for wheezing or shortness of breath., Disp: 75 mL, Rfl: 0 .  Alcohol Swabs (B-D SINGLE USE SWABS REGULAR) PADS, 1 each by Does not apply route 4 (four) times daily., Disp: 200 each,  Rfl: 2 .  allopurinol (ZYLOPRIM) 100 MG tablet, Take 1 tablet (100 mg total) by mouth daily., Disp: 90 tablet, Rfl: 3 .  amoxicillin-clavulanate (AUGMENTIN) 875-125 MG tablet, Take 1 tablet by mouth 3 (three) times daily for 10 days., Disp: 30 tablet, Rfl: 0 .  aspirin EC 81 MG tablet, Take 81 mg by mouth every morning. , Disp: , Rfl:  .  BD INSULIN SYRINGE U/F 31G X 5/16" 0.3 ML MISC, , Disp: , Rfl:  .  blood glucose meter kit and supplies KIT, Dispense based on patient and insurance preference. Use up to four times daily as directed. (FOR ICD-9 250.00, 250.01)., Disp: 1 each, Rfl: 0 .  clindamycin (CLEOCIN) 150 MG capsule, Take 150  mg by mouth 4 (four) times daily. Take 4 tablets at once one hour before dental appointment., Disp: , Rfl:  .  cloNIDine (CATAPRES) 0.1 MG tablet, Take 1 tablet (0.1 mg total) by mouth 2 (two) times daily as needed. Start taking 1 tablet at bedtime for sweating, may increase to twice daily as tolerated., Disp: 180 tablet, Rfl: 0 .  Coenzyme Q10 100 MG capsule, TAKE 1 CAPSULE EVERY DAY, Disp: 90 capsule, Rfl: 1 .  dicyclomine (BENTYL) 20 MG tablet, Take 1 tablet (20 mg total) by mouth 3 (three) times daily as needed (abdominal pain)., Disp: 30 tablet, Rfl: 0 .  Ferrous Sulfate (IRON) 325 (65 Fe) MG TABS, Take 325 mg by mouth 2 (two) times daily. Nature made brand-  Pt takes one tablet twice a day , Disp: , Rfl:  .  fluticasone (FLONASE) 50 MCG/ACT nasal spray, Place 2 sprays into both nostrils daily., Disp: 48 g, Rfl: 2 .  glipiZIDE (GLUCOTROL XL) 10 MG 24 hr tablet, TAKE 1 TABLET EVERY DAY WITH BREAKFAST, Disp: 90 tablet, Rfl: 1 .  insulin NPH Human (HUMULIN N,NOVOLIN N) 100 UNIT/ML injection, Inject 20-25 Units into the skin 2 (two) times daily before a meal. 20 units in the morning and 25 units at bedtime , Disp: , Rfl:  .  ipratropium (ATROVENT) 0.03 % nasal spray, Place 2 sprays into both nostrils every 12 (twelve) hours., Disp: 1 mL, Rfl: 0 .  isosorbide mononitrate  (IMDUR) 60 MG 24 hr tablet, TAKE 1 TABLET EVERY DAY, Disp: 90 tablet, Rfl: 1 .  lisinopril (PRINIVIL,ZESTRIL) 20 MG tablet, Take 1 tablet (20 mg total) by mouth daily., Disp: 90 tablet, Rfl: 1 .  loratadine (CLARITIN) 10 MG tablet, Take 1 tablet (10 mg total) by mouth daily., Disp: 30 tablet, Rfl: 3 .  meloxicam (MOBIC) 7.5 MG tablet, Take by mouth., Disp: , Rfl:  .  metoprolol succinate (TOPROL-XL) 50 MG 24 hr tablet, Take 1 tablet (50 mg total) by mouth daily., Disp: 90 tablet, Rfl: 3 .  montelukast (SINGULAIR) 10 MG tablet, Take 1 tablet by mouth every evening., Disp: , Rfl:  .  pantoprazole (PROTONIX) 40 MG tablet, TAKE 1 TABLET EVERY MORNING, Disp: 90 tablet, Rfl: 1 .  rosuvastatin (CRESTOR) 20 MG tablet, TAKE 1 TABLET EVERY DAY, Disp: 90 tablet, Rfl: 1 .  venlafaxine XR (EFFEXOR-XR) 75 MG 24 hr capsule, Take one tablet a day., Disp: 90 capsule, Rfl: 1  Allergies  Allergen Reactions  . Codeine Other (See Comments)    Unknown reaction  . Contrast Media [Iodinated Diagnostic Agents] Itching  . Sulfa Antibiotics Itching    I personally reviewed active problem list, medication list, allergies, family history, social history with the patient/caregiver today.   ROS  Constitutional: Negative for fever or weight change.  Respiratory: Negative for cough and shortness of breath.   Cardiovascular: Negative for chest pain or palpitations.  Gastrointestinal: Negative for abdominal pain, no bowel changes - symptoms resolved .  Musculoskeletal: Negative for gait problem or joint swelling.  Skin: Negative for rash.  Neurological: Negative for dizziness or headache.  No other specific complaints in a complete review of systems (except as listed in HPI above).  Objective  Vitals:   07/23/18 1311  BP: 128/70  Pulse: 86  Resp: 16  Temp: 97.6 F (36.4 C)  TempSrc: Oral  SpO2: 94%  Weight: 220 lb 12.8 oz (100.2 kg)  Height: 5' 5" (1.651 m)    Body mass index is 36.74 kg/m.  Physical  Exam  Constitutional: Patient appears well-developed and well-nourished. Obese  No distress.  HEENT: head atraumatic, normocephalic, pupils equal and reactive to light, neck supple, throat within normal limits Cardiovascular: Normal rate, regular rhythm and normal heart sounds.  No murmur heard. No BLE edema. Pulmonary/Chest: Effort normal and breath sounds normal. No respiratory distress. Abdominal: Soft.  There is normal bowel sounds, mild  Tenderness right lower quadrant ( opposite from were diverticulitis was . Psychiatric: Patient has a normal mood and affect. behavior is normal. Judgment and thought content normal.  Recent Results (from the past 2160 hour(s))  PTH, Intact and Calcium     Status: None   Collection Time: 06/04/18  9:14 AM  Result Value Ref Range   PTH 50 14 - 64 pg/mL    Comment: . Interpretive Guide    Intact PTH           Calcium ------------------    ----------           ------- Normal Parathyroid    Normal               Normal Hypoparathyroidism    Low or Low Normal    Low Hyperparathyroidism    Primary            Normal or High       High    Secondary          High                 Normal or Low    Tertiary           High                 High Non-Parathyroid    Hypercalcemia      Low or Low Normal    High .    Calcium 9.8 8.6 - 10.4 mg/dL  TSH     Status: None   Collection Time: 06/04/18  9:14 AM  Result Value Ref Range   TSH 0.49 0.40 - 4.50 mIU/L  FSH/LH     Status: None   Collection Time: 06/04/18  9:14 AM  Result Value Ref Range   FSH 18.7 mIU/mL    Comment:                     Reference Range .              Follicular Phase       9.3-57.0              Mid-cycle Peak         3.1-17.7              Luteal Phase           1.5- 9.1              Postmenopausal       23.0-116.3              .    LH 7.1 mIU/mL    Comment:     Reference Range Follicular Phase  1.7-79.3 Mid-Cycle Peak    8.7-76.3 Luteal Phase      0.5-16.9 Postmenopausal     10.0-54.7   COMPLETE METABOLIC PANEL WITH GFR     Status: Abnormal   Collection Time: 06/04/18  9:14 AM  Result Value Ref Range   Glucose, Bld 142 (H) 65 - 99 mg/dL    Comment: .  Fasting reference interval . For someone without known diabetes, a glucose value >125 mg/dL indicates that they may have diabetes and this should be confirmed with a follow-up test. .    BUN 14 7 - 25 mg/dL   Creat 0.84 0.50 - 0.99 mg/dL    Comment: For patients >40 years of age, the reference limit for Creatinine is approximately 13% higher for people identified as African-American. .    GFR, Est Non African American 72 > OR = 60 mL/min/1.32m   GFR, Est African American 84 > OR = 60 mL/min/1.737m  BUN/Creatinine Ratio NOT APPLICABLE 6 - 22 (calc)   Sodium 141 135 - 146 mmol/L   Potassium 3.9 3.5 - 5.3 mmol/L   Chloride 104 98 - 110 mmol/L   CO2 25 20 - 32 mmol/L   Calcium 9.8 8.6 - 10.4 mg/dL   Total Protein 6.6 6.1 - 8.1 g/dL   Albumin 4.5 3.6 - 5.1 g/dL   Globulin 2.1 1.9 - 3.7 g/dL (calc)   AG Ratio 2.1 1.0 - 2.5 (calc)   Total Bilirubin 0.2 0.2 - 1.2 mg/dL   Alkaline phosphatase (APISO) 56 33 - 130 U/L   AST 16 10 - 35 U/L   ALT 11 6 - 29 U/L  CBC w/Diff/Platelet     Status: None   Collection Time: 06/04/18  9:14 AM  Result Value Ref Range   WBC 7.3 3.8 - 10.8 Thousand/uL   RBC 4.37 3.80 - 5.10 Million/uL   Hemoglobin 13.2 11.7 - 15.5 g/dL   HCT 39.6 35.0 - 45.0 %   MCV 90.6 80.0 - 100.0 fL   MCH 30.2 27.0 - 33.0 pg   MCHC 33.3 32.0 - 36.0 g/dL   RDW 13.1 11.0 - 15.0 %   Platelets 320 140 - 400 Thousand/uL   MPV 10.2 7.5 - 12.5 fL   Neutro Abs 3,176 1,500 - 7,800 cells/uL   Lymphs Abs 3,402 850 - 3,900 cells/uL   WBC mixed population 460 200 - 950 cells/uL   Eosinophils Absolute 234 15 - 500 cells/uL   Basophils Absolute 29 0 - 200 cells/uL   Neutrophils Relative % 43.5 %   Total Lymphocyte 46.6 %   Monocytes Relative 6.3 %   Eosinophils Relative 3.2 %   Basophils  Relative 0.4 %  Sed Rate (ESR)     Status: None   Collection Time: 06/04/18  9:14 AM  Result Value Ref Range   Sed Rate 2 0 - 30 mm/h  C-reactive protein     Status: None   Collection Time: 06/04/18  9:14 AM  Result Value Ref Range   CRP 6.1 <8.0 mg/L  TEST AUTHORIZATION     Status: None   Collection Time: 06/04/18  9:14 AM  Result Value Ref Range   TEST NAME: C-REACTIVE PROTEIN    TEST CODE: [id number]    CLIENT CONTACT: ELIZABETH POULOSE    REPORT ALWAYS MESSAGE SIGNATURE      Comment: . The laboratory testing on this patient was verbally requested or confirmed by the ordering physician or his or her authorized representative after contact with an employee of [employer] regulations require that we maintain on file written authorization for all laboratory testing.  Accordingly we are asking that the ordering physician or his or her authorized representative sign a copy of this report and promptly return it to the client service representative. . . Signature:____________________________________________________ . Please fax this signed page to [phone] return it  via your [employer] courier.   Hemoglobin A1c     Status: Abnormal   Collection Time: 06/04/18  9:14 AM  Result Value Ref Range   Hgb A1c MFr Bld 7.8 (H) <5.7 % of total Hgb    Comment: For someone without known diabetes, a hemoglobin A1c value of 6.5% or greater indicates that they may have  diabetes and this should be confirmed with a follow-up  test. . For someone with known diabetes, a value <7% indicates  that their diabetes is well controlled and a value  greater than or equal to 7% indicates suboptimal  control. A1c targets should be individualized based on  duration of diabetes, age, comorbid conditions, and  other considerations. . Currently, no consensus exists regarding use of hemoglobin A1c for diagnosis of diabetes for children. .    Mean Plasma Glucose 177 (calc)    eAG (mmol/L) 9.8 (calc)  TEST AUTHORIZATION 2     Status: None   Collection Time: 06/04/18  9:14 AM  Result Value Ref Range   TEST NAME: HEMOGLOBIN A1c WITH eAG    TEST CODE: [id number]    CLIENT CONTACT: ELIZABETH POULOSE    REPORT ALWAYS MESSAGE SIGNATURE      Comment: . The laboratory testing on this patient was verbally requested or confirmed by the ordering physician or his or her authorized representative after contact with an employee of [employer]. Federal regulations require that we maintain on file written authorization for all laboratory testing.  Accordingly we are asking that the ordering physician or his or her authorized representative sign a copy of this report and promptly return it to the client service representative. . . Signature:____________________________________________________ . Please fax this signed page to [phone] or return it via your [employer] courier.   Metanephrines, Urine, 24 hour     Status: Abnormal   Collection Time: 06/11/18  8:43 AM  Result Value Ref Range   Volume, Urine-VMAUR 2,075 mL   Metanephrines, Ur 212 90 - 315 mcg/24 h    Comment: . This test was developed and its analytical performance characteristics have been determined by [place], [place]. It has not been cleared or approved by the U.S. Food and Drug Administration. This assay has been validated pursuant to the CLIA regulations and is used for clinical purposes. .    Normetanephrine, 24H Ur 785 (H) 122 - 676 mcg/24 h    Comment: . This test was developed and its analytical performance characteristics have been determined by [place], [place]. It has not been cleared or approved by the U.S. Food and Drug Administration. This assay has been validated pursuant to the CLIA regulations and is used for clinical purposes. Cathey Endow Total, Ur 997 (H) 224 - 832 mcg/24 h    Comment: . A  four fold elevation of urinary normetanephrines is extremely likely to be due to a tumor, while a four fold elevation of urinary metanephrines is highly suggestive, but not diagnostic of the tumor. Measurement of plasma Metanephrines and Chromogranin A is recommended for confirmation. .   VMA, urine, 24 hour     Status: None   Collection Time: 06/11/18  8:43 AM  Result Value Ref Range   Volume, Urine-VMAUR 2,075 mL   Vanillylmandelic Acid, (VMA) 5.2 <=2.8 mg/24 h    Comment: . This test was developed and its analytical performance characteristics have been determined by [place], [place]. It has  not been cleared or approved by the U.S. Food and Drug Administration. This assay has been validated pursuant to the CLIA regulations and is used for clinical purposes. .    Creatinine 24h urine 1.75 0.50 - 2.15 g/24 h  Catecholamines, fractionated, Urine, 24 hour     Status: Abnormal   Collection Time: 06/11/18  8:43 AM  Result Value Ref Range   Volume, Urine-VMAUR 2,075 mL   Epinephrine, 24 hr Urine see note     Comment: Results are below the reportable range for this analyte, which is 2.0 mcg/L. Marland Kitchen This test was developed and its analytical performance characteristics have been determined by [place], [place]. It has not been cleared or approved by the U.S. Food and Drug Administration. This assay has been validated pursuant to the CLIA regulations and is used for clinical purposes. .    Norepinephrine, 24 hr Ur 82 15 - 100 mcg/24 h    Comment: . This test was developed and its analytical performance characteristics have been determined by [place], [place]. It has not been cleared or approved by the U.S. Food and Drug Administration. This assay has been validated pursuant to the CLIA regulations and is used for clinical purposes. .    Calculated Total (E+NE) 82 26 - 121 mcg/24  h    Comment: . This test was developed and its analytical performance characteristics have been determined by [place], [place]. It has not been cleared or approved by the U.S. Food and Drug Administration. This assay has been validated pursuant to the CLIA regulations and is used for clinical purposes. .    Dopamine, 24 hr Urine 935 (H) 52 - 480 mcg/24 h    Comment: . This test was developed and its analytical performance characteristics have been determined by [place], [place]. It has not been cleared or approved by the U.S. Food and Drug Administration. This assay has been validated pursuant to the CLIA regulations and is used for clinical purposes. .    Creatinine, Urine mg/day-CATEUR 1.75 0.50 - 2.15 g/24 h  5 HIAA, quantitative, urine, 24 hour     Status: Abnormal   Collection Time: 06/11/18  8:43 AM  Result Value Ref Range   Volume, Urine-VMAUR 2,075 mL/24 h   5-HIAA 18.3 (H) <=6.0 mg/24 h    Comment: . This test was developed and its analytical performance characteristics have been determined by [place], [place]. It has not been cleared or approved by the U.S. Food and Drug Administration. This assay has been validated pursuant to the CLIA regulations and is used for clinical purposes. .   Lipid panel     Status: Abnormal   Collection Time: 07/09/18 10:21 AM  Result Value Ref Range   Cholesterol 126 <200 mg/dL   HDL 50 (L) >50 mg/dL   Triglycerides 65 <150 mg/dL   LDL Cholesterol (Calc) 62 mg/dL (calc)    Comment: Reference range: <100 . Desirable range <100 mg/dL for primary prevention;   <70 mg/dL for patients with CHD or diabetic patients  with > or = 2 CHD risk factors. Marland Kitchen LDL-C is now calculated using the Martin-Hopkins  calculation, which is a validated novel method providing  better accuracy than the Friedewald equation in the  estimation of LDL-C.   Cresenciano Genre et al. Annamaria Helling. 3419;379(02): 2061-2068  (http://education.[facility].com/faq/FAQ164)    Total CHOL/HDL Ratio 2.5 <5.0 (calc)   Non-HDL Cholesterol (  Calc) 76 <130 mg/dL (calc)    Comment: For patients with diabetes plus 1 major ASCVD risk  factor, treating to a non-HDL-C goal of <100 mg/dL  (LDL-C of <70 mg/dL) is considered a therapeutic  option.   Lipase, blood     Status: None   Collection Time: 07/15/18 12:28 PM  Result Value Ref Range   Lipase 25 11 - 51 U/L    Comment: Performed at [facility], [address] [place], [place] [address]  Comprehensive metabolic panel     Status: Abnormal   Collection Time: 07/15/18 12:28 PM  Result Value Ref Range   Sodium 138 135 - 145 mmol/L   Potassium 3.4 (L) 3.5 - 5.1 mmol/L   Chloride 99 98 - 111 mmol/L   CO2 28 22 - 32 mmol/L   Glucose, Bld 100 (H) 70 - 99 mg/dL   BUN 13 8 - 23 mg/dL   Creatinine, Ser 0.68 0.44 - 1.00 mg/dL   Calcium 8.9 8.9 - 10.3 mg/dL   Total Protein 7.1 6.5 - 8.1 g/dL   Albumin 4.0 3.5 - 5.0 g/dL   AST 15 15 - 41 U/L   ALT 13 0 - 44 U/L   Alkaline Phosphatase 58 38 - 126 U/L   Total Bilirubin 0.6 0.3 - 1.2 mg/dL   GFR calc non Af Amer >60 >60 mL/min   GFR calc Af Amer >60 >60 mL/min    Comment: (NOTE) The eGFR has been calculated using the CKD EPI equation. This calculation has not been validated in all clinical situations. eGFR's persistently <60 mL/min signify possible Chronic Kidney Disease.    Anion gap 11 5 - 15    Comment: Performed at [facility], [address] [place], [place] [address]  CBC     Status: None   Collection Time: 07/15/18 12:28 PM  Result Value Ref Range   WBC 8.6 4.0 - 10.5 K/uL   RBC 4.13 3.87 - 5.11 MIL/uL   Hemoglobin 12.8 12.0 - 15.0 g/dL   HCT 38.8 36.0 - 46.0 %   MCV 93.9 80.0 - 100.0 fL   MCH 31.0 26.0 - 34.0 pg   MCHC 33.0 30.0 - 36.0 g/dL   RDW 12.8 11.5 - 15.5 %   Platelets 275 150 - 400 K/uL   nRBC 0.0 0.0 - 0.2 %     Comment: Performed at [facility], [address] [place], [place] [address]  Urinalysis, Complete w Microscopic     Status: Abnormal   Collection Time: 07/15/18 12:29 PM  Result Value Ref Range   Color, Urine YELLOW (A) YELLOW   APPearance CLEAR (A) CLEAR   Specific Gravity, Urine 1.025 1.005 - 1.030   pH 6.0 5.0 - 8.0   Glucose, UA NEGATIVE NEGATIVE mg/dL   Hgb urine dipstick NEGATIVE NEGATIVE   Bilirubin Urine NEGATIVE NEGATIVE   Ketones, ur 5 (A) NEGATIVE mg/dL   Protein, ur 30 (A) NEGATIVE mg/dL   Nitrite NEGATIVE NEGATIVE   Leukocytes, UA NEGATIVE NEGATIVE   RBC / HPF 0-5 0 - 5 RBC/hpf   WBC, UA 0-5 0 - 5 WBC/hpf   Bacteria, UA NONE SEEN NONE SEEN   Squamous Epithelial / LPF 0-5 0 - 5   Mucus PRESENT     Comment: Performed at [facility], [address] [place], [place] [address]    PHQ2/9: Depression screen [facility] 2/9 07/23/2018 07/09/2018 06/05/2018 06/04/2018 01/04/2018  Decreased Interest 0 0 0 0 0  Down, Depressed, Hopeless 0 0 0 0 0  PHQ - 2 Score 0 0 0 0 0  Altered sleeping 0 0 0 - 0  Tired, decreased energy 0 0 0 - 0  Change in appetite 0 0 2 - 3  Feeling bad or failure about yourself  0 0 0 - 0  Trouble concentrating 0 0 0 - 0  Moving slowly or fidgety/restless 0 0 0 - 0  Suicidal thoughts 0 0 0 - 0  PHQ-9 Score 0 0 - - 3  Difficult doing work/chores Not difficult at all Not difficult at all Not difficult at all - Not difficult at all  Some recent data might be hidden    Fall Risk: Fall Risk  07/23/2018 07/09/2018 06/05/2018 06/04/2018 01/04/2018  Falls in the past year? 1 1 Yes Yes No  Number falls in past yr: _0 -  Injury with Fall? 1 1 No No -  Comment - - - - -  Risk for fall due to : - History of fall(s) - - History of fall(s);Impaired vision     Assessment & Plan  1. Diverticulitis of large intestine without perforation or abscess without bleeding  Doing well, taking antibiotics, pain free now   2. Hepatomegaly  - US  Abdomen Limited; Future  3. Fatty liver  - US Abdomen Limited; Future   4. Well controlled type 2 diabetes mellitus with gastroparesis ([place])

## 2018-07-25 ENCOUNTER — Ambulatory Visit
Admission: RE | Admit: 2018-07-25 | Discharge: 2018-07-25 | Disposition: A | Discharge status: Home / Self Care | Payer: Medicare HMO | Source: Ambulatory Visit | Attending: Family Medicine | Admitting: Family Medicine

## 2018-07-25 DIAGNOSIS — R160 Hepatomegaly, not elsewhere classified: Secondary | ICD-10-CM | POA: Diagnosis not present

## 2018-07-25 DIAGNOSIS — K760 Fatty (change of) liver, not elsewhere classified: Secondary | ICD-10-CM

## 2018-07-26 ENCOUNTER — Telehealth: Payer: Self-pay

## 2018-07-26 NOTE — Telephone Encounter (Signed)
Copied from [employer] [id number]. Topic: Quick Communication - Other Results (Clinic Use ONLY) >> Jul 26, 2018  8:32 AM Scherrie Gerlach wrote: Pt would like results of US done yesterday.

## 2018-07-29 ENCOUNTER — Other Ambulatory Visit: Payer: Self-pay | Admitting: Family Medicine

## 2018-07-29 DIAGNOSIS — K760 Fatty (change of) liver, not elsewhere classified: Secondary | ICD-10-CM

## 2018-07-30 ENCOUNTER — Other Ambulatory Visit: Payer: Self-pay | Admitting: Family Medicine

## 2018-07-30 DIAGNOSIS — R160 Hepatomegaly, not elsewhere classified: Secondary | ICD-10-CM

## 2018-08-05 ENCOUNTER — Other Ambulatory Visit: Payer: Self-pay | Admitting: Family Medicine

## 2018-08-05 ENCOUNTER — Telehealth: Payer: Self-pay

## 2018-08-05 MED ORDER — FLUCONAZOLE 150 MG PO TABS: 150.0000 mg | ORAL_TABLET | ORAL | 0 refills | Status: DC

## 2018-08-05 NOTE — Telephone Encounter (Signed)
Copied from [place] [id number]. Topic: General - Other >> Aug 05, 2018  9:27 AM Carolyn Stare wrote:  Pt said her dentist put her on clindamycin and now she has a yeast infection and is asking if something can be called in    [place]

## 2018-08-16 ENCOUNTER — Emergency Department: Payer: Medicare HMO

## 2018-08-16 ENCOUNTER — Encounter: Payer: Self-pay | Admitting: Emergency Medicine

## 2018-08-16 ENCOUNTER — Other Ambulatory Visit: Payer: Self-pay

## 2018-08-16 ENCOUNTER — Emergency Department
Admission: EM | Admit: 2018-08-16 | Discharge: 2018-08-16 | Disposition: A | Discharge status: Home / Self Care | Payer: Medicare HMO | Attending: Emergency Medicine | Admitting: Emergency Medicine

## 2018-08-16 DIAGNOSIS — N183 Chronic kidney disease, stage 3 (moderate): Secondary | ICD-10-CM | POA: Diagnosis not present

## 2018-08-16 DIAGNOSIS — E1122 Type 2 diabetes mellitus with diabetic chronic kidney disease: Secondary | ICD-10-CM | POA: Diagnosis not present

## 2018-08-16 DIAGNOSIS — S39012A Strain of muscle, fascia and tendon of lower back, initial encounter: Secondary | ICD-10-CM | POA: Insufficient documentation

## 2018-08-16 DIAGNOSIS — Y999 Unspecified external cause status: Secondary | ICD-10-CM | POA: Insufficient documentation

## 2018-08-16 DIAGNOSIS — I129 Hypertensive chronic kidney disease with stage 1 through stage 4 chronic kidney disease, or unspecified chronic kidney disease: Secondary | ICD-10-CM | POA: Diagnosis not present

## 2018-08-16 DIAGNOSIS — S299XXA Unspecified injury of thorax, initial encounter: Secondary | ICD-10-CM | POA: Diagnosis not present

## 2018-08-16 DIAGNOSIS — R52 Pain, unspecified: Secondary | ICD-10-CM | POA: Diagnosis not present

## 2018-08-16 DIAGNOSIS — R51 Headache: Secondary | ICD-10-CM | POA: Diagnosis not present

## 2018-08-16 DIAGNOSIS — Y9389 Activity, other specified: Secondary | ICD-10-CM | POA: Insufficient documentation

## 2018-08-16 DIAGNOSIS — Z96651 Presence of right artificial knee joint: Secondary | ICD-10-CM | POA: Diagnosis not present

## 2018-08-16 DIAGNOSIS — J449 Chronic obstructive pulmonary disease, unspecified: Secondary | ICD-10-CM | POA: Insufficient documentation

## 2018-08-16 DIAGNOSIS — E1140 Type 2 diabetes mellitus with diabetic neuropathy, unspecified: Secondary | ICD-10-CM | POA: Diagnosis not present

## 2018-08-16 DIAGNOSIS — Y92410 Unspecified street and highway as the place of occurrence of the external cause: Secondary | ICD-10-CM | POA: Diagnosis not present

## 2018-08-16 DIAGNOSIS — S8991XA Unspecified injury of right lower leg, initial encounter: Secondary | ICD-10-CM | POA: Diagnosis not present

## 2018-08-16 DIAGNOSIS — S3991XA Unspecified injury of abdomen, initial encounter: Secondary | ICD-10-CM | POA: Diagnosis not present

## 2018-08-16 DIAGNOSIS — I2510 Atherosclerotic heart disease of native coronary artery without angina pectoris: Secondary | ICD-10-CM | POA: Diagnosis not present

## 2018-08-16 DIAGNOSIS — M7918 Myalgia, other site: Secondary | ICD-10-CM

## 2018-08-16 DIAGNOSIS — R1013 Epigastric pain: Secondary | ICD-10-CM | POA: Diagnosis not present

## 2018-08-16 DIAGNOSIS — M25561 Pain in right knee: Secondary | ICD-10-CM | POA: Diagnosis not present

## 2018-08-16 DIAGNOSIS — S0990XA Unspecified injury of head, initial encounter: Secondary | ICD-10-CM | POA: Diagnosis not present

## 2018-08-16 DIAGNOSIS — I10 Essential (primary) hypertension: Secondary | ICD-10-CM | POA: Diagnosis not present

## 2018-08-16 DIAGNOSIS — M25551 Pain in right hip: Secondary | ICD-10-CM | POA: Diagnosis not present

## 2018-08-16 DIAGNOSIS — S3992XA Unspecified injury of lower back, initial encounter: Secondary | ICD-10-CM | POA: Diagnosis present

## 2018-08-16 DIAGNOSIS — S199XXA Unspecified injury of neck, initial encounter: Secondary | ICD-10-CM | POA: Diagnosis not present

## 2018-08-16 DIAGNOSIS — R079 Chest pain, unspecified: Secondary | ICD-10-CM | POA: Diagnosis not present

## 2018-08-16 DIAGNOSIS — S79911A Unspecified injury of right hip, initial encounter: Secondary | ICD-10-CM | POA: Diagnosis not present

## 2018-08-16 LAB — COMPREHENSIVE METABOLIC PANEL
ALBUMIN: 3.9 g/dL (ref 3.5–5.0)
ALK PHOS: 61 U/L (ref 38–126)
ALT: 16 U/L (ref 0–44)
ANION GAP: 7 (ref 5–15)
AST: 22 U/L (ref 15–41)
BILIRUBIN TOTAL: 0.5 mg/dL (ref 0.3–1.2)
BUN: 15 mg/dL (ref 8–23)
CALCIUM: 9.2 mg/dL (ref 8.9–10.3)
CO2: 24 mmol/L (ref 22–32)
CREATININE: 0.71 mg/dL (ref 0.44–1.00)
Chloride: 109 mmol/L (ref 98–111)
GFR calc Af Amer: >60 mL/min (ref >60)
GFR calc non Af Amer: >60 mL/min (ref >60)
GLUCOSE: 152 mg/dL — AB (ref 70–99)
Potassium: 3.5 mmol/L (ref 3.5–5.1)
SODIUM: 140 mmol/L (ref 135–145)
TOTAL PROTEIN: 6.8 g/dL (ref 6.5–8.1)

## 2018-08-16 LAB — CBC WITH DIFFERENTIAL/PLATELET
ABS IMMATURE GRANULOCYTES: 0.01 10*3/uL (ref 0.00–0.07)
BASOS ABS: 0 10*3/uL (ref 0.0–0.1)
Basophils Relative: 1 %
EOS PCT: 5 %
Eosinophils Absolute: 0.3 10*3/uL (ref 0.0–0.5)
HEMATOCRIT: 39.7 % (ref 36.0–46.0)
Hemoglobin: 13.1 g/dL (ref 12.0–15.0)
Immature Granulocytes: 0 %
LYMPHS ABS: 3 10*3/uL (ref 0.7–4.0)
LYMPHS PCT: 48 %
MCH: 30.3 pg (ref 26.0–34.0)
MCHC: 33 g/dL (ref 30.0–36.0)
MCV: 91.7 fL (ref 80.0–100.0)
MONO ABS: 0.5 10*3/uL (ref 0.1–1.0)
Monocytes Relative: 8 %
NEUTROS ABS: 2.3 10*3/uL (ref 1.7–7.7)
Neutrophils Relative %: 38 %
Platelets: 277 10*3/uL (ref 150–400)
RBC: 4.33 MIL/uL (ref 3.87–5.11)
RDW: 13.2 % (ref 11.5–15.5)
WBC: 6 10*3/uL (ref 4.0–10.5)
nRBC: 0 % (ref 0.0–0.2)

## 2018-08-16 MED ORDER — CYCLOBENZAPRINE HCL 5 MG PO TABS: 5.0000 mg | ORAL_TABLET | Freq: Three times a day (TID) | ORAL | 0 refills | Status: DC | PRN

## 2018-08-16 MED ORDER — ACETAMINOPHEN 325 MG PO TABS
650.0000 mg | ORAL_TABLET | Freq: Once | ORAL | Status: AC
  Administered 2018-08-16: 650 mg via ORAL
  Filled 2018-08-16: qty 2

## 2018-08-16 MED ORDER — FENTANYL CITRATE (PF) 100 MCG/2ML IJ SOLN
50.0000 ug | Freq: Once | INTRAMUSCULAR | Status: AC
  Administered 2018-08-16: 50 ug via INTRAVENOUS
  Filled 2018-08-16: qty 2
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
## 2018-08-16 NOTE — ED Notes (Signed)
Patient transported to CT 

## 2018-08-19 ENCOUNTER — Encounter: Payer: Self-pay | Admitting: Nurse Practitioner

## 2018-08-19 ENCOUNTER — Ambulatory Visit (INDEPENDENT_AMBULATORY_CARE_PROVIDER_SITE_OTHER): Payer: Self-pay | Admitting: Nurse Practitioner

## 2018-08-19 VITALS — BP 144/88 | HR 95 | Temp 97.7°F | Resp 12 | Ht 65.5 in | Wt 220.1 lb

## 2018-08-19 DIAGNOSIS — R52 Pain, unspecified: Secondary | ICD-10-CM
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
## 2018-08-21 ENCOUNTER — Encounter: Payer: Self-pay | Admitting: Gastroenterology

## 2018-08-21 ENCOUNTER — Other Ambulatory Visit: Payer: Self-pay

## 2018-08-21 ENCOUNTER — Encounter: Payer: Self-pay | Admitting: *Deleted

## 2018-08-21 ENCOUNTER — Ambulatory Visit: Payer: Medicare HMO | Admitting: Gastroenterology

## 2018-08-21 VITALS — BP 163/88 | HR 105 | Ht 65.5 in | Wt 219.6 lb

## 2018-08-21 DIAGNOSIS — R160 Hepatomegaly, not elsewhere classified: Secondary | ICD-10-CM

## 2018-08-21 DIAGNOSIS — K319 Disease of stomach and duodenum, unspecified: Secondary | ICD-10-CM

## 2018-08-21 DIAGNOSIS — Z86010 Personal history of colonic polyps: Secondary | ICD-10-CM

## 2018-08-21 MED ORDER — NA SULFATE-K SULFATE-MG SULF 17.5-3.13-1.6 GM/177ML PO SOLN: 1.0000 | ORAL | 0 refills | Status: DC
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
## 2018-08-26 NOTE — Discharge Instructions (Signed)
General Anesthesia, Adult, Care After  This sheet gives you information about how to care for yourself after your procedure. Your health care provider may also give you more specific instructions. If you have problems or questions, contact your health care provider.  What can I expect after the procedure?  After the procedure, the following side effects are common:  Pain or discomfort at the IV site.  Nausea.  Vomiting.  Sore throat.  Trouble concentrating.  Feeling cold or chills.  Weak or tired.  Sleepiness and fatigue.  Soreness and body aches. These side effects can affect parts of the body that were not involved in surgery.  Follow these instructions at home:    For at least 24 hours after the procedure:  Have a responsible adult stay with you. It is important to have someone help care for you until you are awake and alert.  Rest as needed.  Do not:  Participate in activities in which you could fall or become injured.  Drive.  Use heavy machinery.  Drink alcohol.  Take sleeping pills or medicines that cause drowsiness.  Make important decisions or sign legal documents.  Take care of children on your own.  Eating and drinking  Follow any instructions from your health care provider about eating or drinking restrictions.  When you feel hungry, start by eating small amounts of foods that are soft and easy to digest (bland), such as toast. Gradually return to your regular diet.  Drink enough fluid to keep your urine pale yellow.  If you vomit, rehydrate by drinking water, juice, or clear broth.  General instructions  If you have sleep apnea, surgery and certain medicines can increase your risk for breathing problems. Follow instructions from your health care provider about wearing your sleep device:  Anytime you are sleeping, including during daytime naps.  While taking prescription pain medicines, sleeping medicines, or medicines that make you drowsy.  Return to your normal activities as told by your health care  provider. Ask your health care provider what activities are safe for you.  Take over-the-counter and prescription medicines only as told by your health care provider.  If you smoke, do not smoke without supervision.  Keep all follow-up visits as told by your health care provider. This is important.  Contact a health care provider if:  You have nausea or vomiting that does not get better with medicine.  You cannot eat or drink without vomiting.  You have pain that does not get better with medicine.  You are unable to pass urine.  You develop a skin rash.  You have a fever.  You have redness around your IV site that gets worse.  Get help right away if:  You have difficulty breathing.  You have chest pain.  You have blood in your urine or stool, or you vomit blood.  Summary  After the procedure, it is common to have a sore throat or nausea. It is also common to feel tired.  Have a responsible adult stay with you for the first 24 hours after general anesthesia. It is important to have someone help care for you until you are awake and alert.  When you feel hungry, start by eating small amounts of foods that are soft and easy to digest (bland), such as toast. Gradually return to your regular diet.  Drink enough fluid to keep your urine pale yellow.  Return to your normal activities as told by your health care provider. Ask your health care   provider what activities are safe for you.  This information is not intended to replace advice given to you by your health care provider. Make sure you discuss any questions you have with your health care provider.  Document Released: 11/27/2000 Document Revised: 04/06/2017 Document Reviewed: 04/06/2017  [employer]vier Interactive Patient Education  2019 [employer]

## 2018-08-29 ENCOUNTER — Encounter: Payer: Self-pay | Admitting: Family Medicine

## 2018-08-29 ENCOUNTER — Ambulatory Visit: Payer: Self-pay | Admitting: Family Medicine

## 2018-08-29 ENCOUNTER — Other Ambulatory Visit: Payer: Self-pay

## 2018-08-29 DIAGNOSIS — M542 Cervicalgia: Secondary | ICD-10-CM

## 2018-08-29 DIAGNOSIS — F330 Major depressive disorder, recurrent, mild: Secondary | ICD-10-CM

## 2018-08-29 DIAGNOSIS — M545 Low back pain, unspecified: Secondary | ICD-10-CM

## 2018-08-29 DIAGNOSIS — S060X0D Concussion without loss of consciousness, subsequent encounter: Secondary | ICD-10-CM

## 2018-08-29 DIAGNOSIS — G44309 Post-traumatic headache, unspecified, not intractable: Secondary | ICD-10-CM

## 2018-08-29 DIAGNOSIS — I10 Essential (primary) hypertension: Secondary | ICD-10-CM

## 2018-08-29 MED ORDER — BACLOFEN 20 MG PO TABS: 20.0000 mg | ORAL_TABLET | Freq: Three times a day (TID) | ORAL | 0 refills | Status: DC

## 2018-08-29 MED ORDER — NORTRIPTYLINE HCL 25 MG PO CAPS: 25.0000 mg | ORAL_CAPSULE | Freq: Every day | ORAL | 0 refills | Status: DC

## 2018-08-29 NOTE — Telephone Encounter (Signed)
Nortriptyline HCL 25 mg Caps was approved for prior auth. The approvals is good until 09/04/2019.
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
## 2018-08-30 MED ORDER — NORTRIPTYLINE HCL 25 MG PO CAPS: 25.0000 mg | ORAL_CAPSULE | Freq: Every day | ORAL | 0 refills | Status: DC

## 2018-09-02 ENCOUNTER — Ambulatory Visit
Admission: RE | Admit: 2018-09-02 | Discharge: 2018-09-02 | Disposition: A | Discharge status: Home / Self Care | Payer: Medicare HMO | Attending: Gastroenterology | Admitting: Gastroenterology

## 2018-09-02 ENCOUNTER — Ambulatory Visit: Payer: Medicare HMO | Admitting: Anesthesiology

## 2018-09-02 ENCOUNTER — Encounter
Admission: RE | Disposition: A | Discharge status: Home / Self Care | Payer: Self-pay | Source: Home / Self Care | Attending: Gastroenterology

## 2018-09-02 DIAGNOSIS — F419 Anxiety disorder, unspecified: Secondary | ICD-10-CM | POA: Diagnosis not present

## 2018-09-02 DIAGNOSIS — E785 Hyperlipidemia, unspecified: Secondary | ICD-10-CM | POA: Insufficient documentation

## 2018-09-02 DIAGNOSIS — K3189 Other diseases of stomach and duodenum: Secondary | ICD-10-CM | POA: Diagnosis not present

## 2018-09-02 DIAGNOSIS — Z87891 Personal history of nicotine dependence: Secondary | ICD-10-CM | POA: Insufficient documentation

## 2018-09-02 DIAGNOSIS — Z7982 Long term (current) use of aspirin: Secondary | ICD-10-CM | POA: Diagnosis not present

## 2018-09-02 DIAGNOSIS — G4733 Obstructive sleep apnea (adult) (pediatric): Secondary | ICD-10-CM | POA: Insufficient documentation

## 2018-09-02 DIAGNOSIS — K319 Disease of stomach and duodenum, unspecified: Secondary | ICD-10-CM | POA: Insufficient documentation

## 2018-09-02 DIAGNOSIS — I2510 Atherosclerotic heart disease of native coronary artery without angina pectoris: Secondary | ICD-10-CM | POA: Diagnosis not present

## 2018-09-02 DIAGNOSIS — Z955 Presence of coronary angioplasty implant and graft: Secondary | ICD-10-CM | POA: Insufficient documentation

## 2018-09-02 DIAGNOSIS — Q402 Other specified congenital malformations of stomach: Secondary | ICD-10-CM | POA: Insufficient documentation

## 2018-09-02 DIAGNOSIS — J449 Chronic obstructive pulmonary disease, unspecified: Secondary | ICD-10-CM | POA: Insufficient documentation

## 2018-09-02 DIAGNOSIS — K3184 Gastroparesis: Secondary | ICD-10-CM | POA: Insufficient documentation

## 2018-09-02 DIAGNOSIS — Z96652 Presence of left artificial knee joint: Secondary | ICD-10-CM | POA: Insufficient documentation

## 2018-09-02 DIAGNOSIS — K640 First degree hemorrhoids: Secondary | ICD-10-CM | POA: Diagnosis not present

## 2018-09-02 DIAGNOSIS — K5730 Diverticulosis of large intestine without perforation or abscess without bleeding: Secondary | ICD-10-CM | POA: Insufficient documentation

## 2018-09-02 DIAGNOSIS — Z1211 Encounter for screening for malignant neoplasm of colon: Secondary | ICD-10-CM | POA: Insufficient documentation

## 2018-09-02 DIAGNOSIS — F329 Major depressive disorder, single episode, unspecified: Secondary | ICD-10-CM | POA: Insufficient documentation

## 2018-09-02 DIAGNOSIS — Z791 Long term (current) use of non-steroidal anti-inflammatories (NSAID): Secondary | ICD-10-CM | POA: Diagnosis not present

## 2018-09-02 DIAGNOSIS — I10 Essential (primary) hypertension: Secondary | ICD-10-CM | POA: Insufficient documentation

## 2018-09-02 DIAGNOSIS — Z79899 Other long term (current) drug therapy: Secondary | ICD-10-CM | POA: Insufficient documentation

## 2018-09-02 DIAGNOSIS — Z794 Long term (current) use of insulin: Secondary | ICD-10-CM | POA: Insufficient documentation

## 2018-09-02 DIAGNOSIS — M109 Gout, unspecified: Secondary | ICD-10-CM | POA: Insufficient documentation

## 2018-09-02 DIAGNOSIS — K2950 Unspecified chronic gastritis without bleeding: Secondary | ICD-10-CM | POA: Diagnosis not present

## 2018-09-02 DIAGNOSIS — K219 Gastro-esophageal reflux disease without esophagitis: Secondary | ICD-10-CM | POA: Insufficient documentation

## 2018-09-02 DIAGNOSIS — E1143 Type 2 diabetes mellitus with diabetic autonomic (poly)neuropathy: Secondary | ICD-10-CM | POA: Diagnosis not present

## 2018-09-02 DIAGNOSIS — Z86010 Personal history of colonic polyps: Secondary | ICD-10-CM | POA: Insufficient documentation

## 2018-09-02 HISTORY — PX: COLONOSCOPY WITH PROPOFOL: SHX5780

## 2018-09-02 HISTORY — PX: ESOPHAGOGASTRODUODENOSCOPY (EGD) WITH PROPOFOL: SHX5813

## 2018-09-02 HISTORY — DX: Gastro-esophageal reflux disease without esophagitis: K21.9

## 2018-09-02 LAB — GLUCOSE, CAPILLARY
Glucose-Capillary: 154 mg/dL — ABNORMAL HIGH (ref 70–99)
Glucose-Capillary: 137 mg/dL — ABNORMAL HIGH (ref 70–99)

## 2018-09-02 LAB — HM COLONOSCOPY

## 2018-09-02 SURGERY — COLONOSCOPY WITH PROPOFOL
Anesthesia: General | Site: Throat

## 2018-09-02 MED ORDER — LACTATED RINGERS IV SOLN: INTRAVENOUS | Status: DC

## 2018-09-02 MED ORDER — STERILE WATER FOR IRRIGATION IR SOLN
Status: DC | PRN
  Administered 2018-09-02: 09:00:00

## 2018-09-02 MED ORDER — GLYCOPYRROLATE 0.2 MG/ML IJ SOLN
INTRAMUSCULAR | Status: DC | PRN
  Administered 2018-09-02: 0.1 mg via INTRAVENOUS

## 2018-09-02 MED ORDER — LACTATED RINGERS IV SOLN
INTRAVENOUS | Status: DC
  Administered 2018-09-02: 08:00:00 via INTRAVENOUS

## 2018-09-02 MED ORDER — SODIUM CHLORIDE 0.9 % IV SOLN: INTRAVENOUS | Status: DC

## 2018-09-02 MED ORDER — OXYCODONE HCL 5 MG/5ML PO SOLN: 5.0000 mg | Freq: Once | ORAL | Status: DC | PRN

## 2018-09-02 MED ORDER — PROPOFOL 10 MG/ML IV BOLUS
INTRAVENOUS | Status: DC | PRN
  Administered 2018-09-02: 70 mg via INTRAVENOUS
  Administered 2018-09-02: 20 mg via INTRAVENOUS
  Administered 2018-09-02: 10 mg via INTRAVENOUS
  Administered 2018-09-02 (×10): 20 mg via INTRAVENOUS

## 2018-09-02 MED ORDER — OXYCODONE HCL 5 MG PO TABS: 5.0000 mg | ORAL_TABLET | Freq: Once | ORAL | Status: DC | PRN

## 2018-09-02 MED ORDER — LIDOCAINE HCL (CARDIAC) PF 100 MG/5ML IV SOSY
PREFILLED_SYRINGE | INTRAVENOUS | Status: DC | PRN
  Administered 2018-09-02: 40 mg via INTRAVENOUS

## 2018-09-02 SURGICAL SUPPLY — 7 items
BLOCK BITE 60FR ADLT L/F GRN (MISCELLANEOUS) ×4 IMPLANT
CANISTER SUCT 1200ML W/VALVE (MISCELLANEOUS) ×4 IMPLANT
FORCEPS BIOP RAD 4 LRG CAP 4 (CUTTING FORCEPS) ×4 IMPLANT
GOWN CVR UNV OPN BCK APRN NK (MISCELLANEOUS) ×4 IMPLANT
GOWN ISOL THUMB LOOP REG UNIV (MISCELLANEOUS) ×4
KIT ENDO PROCEDURE OLY (KITS) ×4 IMPLANT
WATER STERILE IRR 250ML POUR (IV SOLUTION) ×4 IMPLANT

## 2018-09-02 NOTE — Transfer of Care (Signed)
Immediate Anesthesia Transfer of Care Note  Patient: Dana Sparks  Procedure(s) Performed: COLONOSCOPY WITH PROPOFOL (N/A Rectum) ESOPHAGOGASTRODUODENOSCOPY (EGD) WITH BIOPSIES (N/A Throat)  Patient Location: PACU  Anesthesia Type: General  Level of Consciousness: awake, alert  and patient cooperative  Airway and Oxygen Therapy: Patient Spontanous Breathing and Patient connected to supplemental oxygen  Post-op Assessment: Post-op Vital signs reviewed, Patient's Cardiovascular Status Stable, Respiratory Function Stable, Patent Airway and No signs of Nausea or vomiting  Post-op Vital Signs: Reviewed and stable  Complications: No apparent anesthesia complications

## 2018-09-02 NOTE — Anesthesia Postprocedure Evaluation (Signed)
Anesthesia Post Note  Patient: Dana Sparks  Procedure(s) Performed: COLONOSCOPY WITH PROPOFOL (N/A Rectum) ESOPHAGOGASTRODUODENOSCOPY (EGD) WITH BIOPSIES (N/A Throat)  Patient location during evaluation: PACU Anesthesia Type: General Level of consciousness: awake and alert Pain management: pain level controlled Vital Signs Assessment: post-procedure vital signs reviewed and stable Respiratory status: spontaneous breathing, nonlabored ventilation, respiratory function stable and patient connected to nasal cannula oxygen Cardiovascular status: blood pressure returned to baseline and stable Postop Assessment: no apparent nausea or vomiting Anesthetic complications: no    Chiann Goffredo

## 2018-09-02 NOTE — Op Note (Signed)
[facility] Gastroenterology Patient Name: Dana Sparks Procedure Date: 09/02/2018 8:15 AM MRN: [id number] Account #: [id number] Date of Birth: 09-30-1950 Admit Type: Outpatient Age: 67 Room: [facility] OR ROOM 01 Gender: Female Note Status: Finalized Procedure:            Upper GI endoscopy Indications:          Gastric intestinal metaplasia with dysplasia Providers:            Lucilla Lame MD, MD Referring MD:         Bethena Roys. Sowles, MD (Referring MD) Medicines:            Propofol per Anesthesia Complications:        No immediate complications. Procedure:            Pre-Anesthesia Assessment:                       - Prior to the procedure, a History and Physical was                        performed, and patient medications and allergies were                        reviewed. The patient's tolerance of previous                        anesthesia was also reviewed. The risks and benefits of                        the procedure and the sedation options and risks were                        discussed with the patient. All questions were                        answered, and informed consent was obtained. Prior                        Anticoagulants: The patient has taken no previous                        anticoagulant or antiplatelet agents. ASA Grade                        Assessment: II - A patient with mild systemic disease.                        After reviewing the risks and benefits, the patient was                        deemed in satisfactory condition to undergo the                        procedure.                       After obtaining informed consent, the endoscope was                        passed under direct vision. Throughout the procedure,  the patient's blood pressure, pulse, and oxygen                        saturations were monitored continuously. The was                        introduced through the mouth, and advanced to the                      second part of duodenum. The upper GI endoscopy was                        accomplished without difficulty. The patient tolerated                        the procedure well. Findings:      The examined esophagus was normal.      The entire examined stomach was normal. Biopsies were taken with a cold       forceps for histology.      The examined duodenum was normal. Impression:           - Normal esophagus.                       - Normal stomach. Biopsied.                       - Normal examined duodenum. Recommendation:       - Discharge patient to home.                       - Resume previous diet.                       - Continue present medications.                       - Await pathology results.                       - Repeat upper endoscopy in 2 years for surveillance. Procedure Code(s):    --- Professional ---                       [phone], Esophagogastroduodenoscopy, flexible, transoral;                        with biopsy, single or multiple Diagnosis Code(s):    --- Professional ---                       K31.89, Other diseases of stomach and duodenum CPT copyright 2018 American Medical Association. All rights reserved. The codes documented in this report are preliminary and upon coder review may  be revised to meet current compliance requirements. Lucilla Lame MD, MD 09/02/2018 8:36:17 AM This report has been signed electronically. Number of Addenda: 0 Note Initiated On: 09/02/2018 8:15 AM Total Procedure Duration: 0 hours 4 minutes 14 seconds       [facility]

## 2018-09-02 NOTE — Anesthesia Procedure Notes (Signed)
Performed by: Elpidio Thielen, CRNA Pre-anesthesia Checklist: Patient identified, Emergency Drugs available, Suction available, Timeout performed and Patient being monitored Patient Re-evaluated:Patient Re-evaluated prior to induction Oxygen Delivery Method: Nasal cannula Placement Confirmation: positive ETCO2       

## 2018-09-02 NOTE — Op Note (Signed)
[facility] Gastroenterology Patient Name: Dana Sparks Procedure Date: 09/02/2018 8:11 AM MRN: [id number] Account #: [id number] Date of Birth: [birth year] Admit Type: Outpatient Age: 67 Room: [facility] OR ROOM 01 Gender: Female Note Status: Finalized Procedure:            Colonoscopy Indications:          High risk colon cancer surveillance: Personal history                        of colonic polyps Providers:            Lucilla Lame MD, MD Referring MD:         Bethena Roys. Sowles, MD (Referring MD) Medicines:            Propofol per Anesthesia Complications:        No immediate complications. Procedure:            Pre-Anesthesia Assessment:                       - Prior to the procedure, a History and Physical was                        performed, and patient medications and allergies were                        reviewed. The patient's tolerance of previous                        anesthesia was also reviewed. The risks and benefits of                        the procedure and the sedation options and risks were                        discussed with the patient. All questions were                        answered, and informed consent was obtained. Prior                        Anticoagulants: The patient has taken no previous                        anticoagulant or antiplatelet agents. ASA Grade                        Assessment: II - A patient with mild systemic disease.                        After reviewing the risks and benefits, the patient was                        deemed in satisfactory condition to undergo the                        procedure.                       After obtaining informed consent, the colonoscope was  passed under direct vision. Throughout the procedure,                        the patient's blood pressure, pulse, and oxygen                        saturations were monitored continuously. The was   introduced through the anus and advanced to the the                        cecum, identified by appendiceal orifice and ileocecal                        valve. The colonoscopy was performed without                        difficulty. The patient tolerated the procedure well.                        The quality of the bowel preparation was excellent. Findings:      The perianal and digital rectal examinations were normal.      Non-bleeding internal hemorrhoids were found during retroflexion. The       hemorrhoids were Grade I (internal hemorrhoids that do not prolapse).      A few small-mouthed diverticula were found in the sigmoid colon. Impression:           - Non-bleeding internal hemorrhoids.                       - Diverticulosis in the sigmoid colon.                       - No specimens collected. Recommendation:       - Discharge patient to home.                       - Resume previous diet.                       - Continue present medications.                       - Repeat colonoscopy in 5 years for surveillance. Procedure Code(s):    --- Professional ---                       [phone], Colonoscopy, flexible; diagnostic, including                        collection of specimen(s) by brushing or washing, when                        performed (separate procedure) Diagnosis Code(s):    --- Professional ---                       Z86.010, Personal history of colonic polyps CPT copyright 2018 American Medical Association. All rights reserved. The codes documented in this report are preliminary and upon coder review may  be revised to meet current compliance requirements. Lucilla Lame MD, MD 09/02/2018 8:50:52 AM This report has been signed electronically. Number of Addenda: 0 Note Initiated On: 09/02/2018 8:11 AM Scope  Withdrawal Time: 0 hours 7 minutes 35 seconds  Total Procedure Duration: 0 hours 10 minutes 26 seconds       [facility]

## 2018-09-02 NOTE — H&P (Signed)
Lucilla Lame, MD [facility] [address] [place], [place] [address] Phone:[phone] Fax : [phone]  Primary Care Physician:  Steele Sizer, MD Primary Gastroenterologist:  Dr. Allen Norris  Pre-Procedure History & Physical: HPI:  Dana Sparks is a 67 y.o. female is here for an endoscopy and colonoscopy.   Past Medical History:  Diagnosis Date  . Allergy   . Anxiety   . Arthritis    right knee  . CAD (coronary artery disease)   . COPD (chronic obstructive pulmonary disease) ([place])   . Depression   . Diabetes mellitus without complication ([place])    type 2  . Diverticulitis   . Gastroparesis   . GERD (gastroesophageal reflux disease)   . Gout   . Hyperlipemia   . Hypertension   . Positive H. pylori test   . Renal insufficiency   . Sleep apnea    CPAP  . Thrombocytosis ([place]) 01/28/2015  . Tubular adenoma of colon 01/20/14    Past Surgical History:  Procedure Laterality Date  . ABDOMINAL HYSTERECTOMY     total  . CATARACT EXTRACTION    . COLONOSCOPY  01/2014   sigmoid diverticulosis. desc colon TA, hyperplastic polyp  . CORONARY ANGIOPLASTY WITH STENT PLACEMENT    . ESOPHAGOGASTRODUODENOSCOPY N/A 02/16/2015   negative h pylori, focal gastic intestinal metaplasia  . TOTAL KNEE ARTHROPLASTY Right     Prior to Admission medications   Medication Sig Start Date End Date Taking? Authorizing Provider  allopurinol (ZYLOPRIM) 100 MG tablet Take 1 tablet (100 mg total) by mouth daily. 07/09/18  Yes Steele Sizer, MD  aspirin EC 81 MG tablet Take 81 mg by mouth every morning.    Yes [provider]  baclofen (LIORESAL) 20 MG tablet Take 1 tablet (20 mg total) by mouth 3 (three) times daily. 08/29/18  Yes Sowles, Drue Stager, MD  cloNIDine (CATAPRES) 0.1 MG tablet Take 1 tablet (0.1 mg total) by mouth 2 (two) times daily as needed. Start taking 1 tablet at bedtime for sweating, may increase to twice daily as tolerated. 07/09/18  Yes Sowles, Drue Stager, MD  Coenzyme Q10 100 MG  capsule TAKE 1 CAPSULE EVERY DAY 03/17/18  Yes Sowles, Drue Stager, MD  dicyclomine (BENTYL) 20 MG tablet Take 1 tablet (20 mg total) by mouth 3 (three) times daily as needed (abdominal pain). 07/15/18  Yes Nance Pear, MD  Ferrous Sulfate (IRON) 325 (65 Fe) MG TABS Take 325 mg by mouth 2 (two) times daily. Nature made brand-  Pt takes one tablet twice a day    Yes [provider]  fluticasone (FLONASE) 50 MCG/ACT nasal spray Place 2 sprays into both nostrils daily. 08/20/17  Yes Sowles, Drue Stager, MD  glipiZIDE (GLUCOTROL XL) 10 MG 24 hr tablet TAKE 1 TABLET EVERY DAY WITH BREAKFAST 01/04/17  Yes Sowles, Drue Stager, MD  insulin NPH Human (HUMULIN N,NOVOLIN N) 100 UNIT/ML injection Inject 20-25 Units into the skin 2 (two) times daily before a meal. 20 units in the morning and 25 units at bedtime    Yes Solum, Betsey Holiday, MD  ipratropium (ATROVENT) 0.03 % nasal spray Place 2 sprays into both nostrils every 12 (twelve) hours. 03/15/18  Yes Poulose, Bethel Born, NP  isosorbide mononitrate (IMDUR) 60 MG 24 hr tablet TAKE 1 TABLET EVERY DAY 07/02/18  Yes Sowles, Drue Stager, MD  lisinopril (PRINIVIL,ZESTRIL) 20 MG tablet Take 1 tablet (20 mg total) by mouth daily. 07/09/18  Yes Sowles, Drue Stager, MD  loratadine (CLARITIN) 10 MG tablet Take 1 tablet (10  mg total) by mouth daily. 03/15/18  Yes Poulose, Bethel Born, NP  meloxicam (MOBIC) 7.5 MG tablet Take by mouth. 02/20/17  Yes [provider]  metoprolol succinate (TOPROL-XL) 50 MG 24 hr tablet Take 1 tablet (50 mg total) by mouth daily. 07/09/18  Yes Sowles, Drue Stager, MD  montelukast (SINGULAIR) 10 MG tablet Take 1 tablet by mouth every evening. 09/28/17  Yes Vaught, Jeannie Fend, MD  nortriptyline (PAMELOR) 25 MG capsule Take 1 capsule (25 mg total) by mouth at bedtime. 08/30/18  Yes Sowles, Drue Stager, MD  pantoprazole (PROTONIX) 40 MG tablet TAKE 1 TABLET EVERY MORNING 07/02/18  Yes Sowles, Drue Stager, MD  rosuvastatin (CRESTOR) 20 MG tablet TAKE 1 TABLET EVERY DAY  07/02/18  Yes Sowles, Drue Stager, MD  venlafaxine XR (EFFEXOR-XR) 75 MG 24 hr capsule Take one tablet a day. 07/11/18  Yes Steele Sizer, MD  ACCU-CHEK FASTCLIX LANCETS [place]  06/06/16   [provider]  ACCU-CHEK SMARTVIEW test strip Check fsbs two times daily  DMII 05/18/17   Steele Sizer, MD  albuterol (PROVENTIL) (2.5 MG/3ML) 0.083% nebulizer solution Take 3 mLs (2.5 mg total) by nebulization every 6 (six) hours as needed for wheezing or shortness of breath. Patient not taking: Reported on 09/02/2018 11/01/17   Hubbard Hartshorn, FNP  Alcohol Swabs (B-D SINGLE USE SWABS REGULAR) PADS 1 each by Does not apply route 4 (four) times daily. 06/11/16   Steele Sizer, MD  BD INSULIN SYRINGE U/F 31G X 5/16" 0.3 ML MISC  12/12/17   [provider]  blood glucose meter kit and supplies KIT Dispense based on patient and insurance preference. Use up to four times daily as directed. (FOR ICD-9 250.00, 250.01). 05/02/16   Gladstone Lighter, MD  clindamycin (CLEOCIN) 150 MG capsule Take 150 mg by mouth 4 (four) times daily. Take 4 tablets at once one hour before dental appointment.    [provider]  cyclobenzaprine (FLEXERIL) 5 MG tablet Take 1 tablet (5 mg total) by mouth 3 (three) times daily as needed for muscle spasms. 08/16/18   Schuyler Amor, MD  fluconazole (DIFLUCAN) 150 MG tablet Take 1 tablet (150 mg total) by mouth every other day. 08/05/18   Steele Sizer, MD  Na Sulfate-K Sulfate-Mg Sulf (SUPREP BOWEL PREP KIT) 17.5-3.13-1.6 GM/177ML SOLN Take 1 kit by mouth as directed. 08/21/18   Lucilla Lame, MD    Allergies as of 08/21/2018 - Review Complete 08/21/2018  Allergen Reaction Noted  . Codeine Other (See Comments) 12/31/2014  . Contrast media [iodinated diagnostic agents] Itching 02/16/2015  . Sulfa antibiotics Itching 02/16/2015    Family History  Problem Relation Age of Onset  . Stroke Sister   . Hyperlipidemia Sister   . Alcohol abuse Brother   . Diabetes  Brother   . Stroke Brother   . Hypertension Mother   . Diabetes Mother   . Heart disease Mother   . Diabetes Father   . Heart disease Father   . Hypertension Father   . Hypertension Sister   . Cancer Maternal Grandmother        Unsure  . Diabetes Maternal Grandfather   . Colon cancer Neg Hx   . Liver disease Neg Hx   . Breast cancer Neg Hx     Social History   Socioeconomic History  . Marital status: Married    Spouse name: Fritz Pickerel  . Number of children: 2  . Years of education: some college  . Highest education level: 12th grade  Occupational History  .  Occupation: Disabled  Social Needs  . Financial resource strain: Hard  . Food insecurity:    Worry: Never true    Inability: Never true  . Transportation needs:    Medical: No    Non-medical: No  Tobacco Use  . Smoking status: Former Smoker    Packs/day: 1.00    Years: 40.00    Pack years: 40.00    Types: Cigarettes    Start date: 09/04/1972    Last attempt to quit: 12/30/2012    Years since quitting: 5.6  . Smokeless tobacco: Never Used  . Tobacco comment: smoking cessation materials not required  Substance and Sexual Activity  . Alcohol use: No    Alcohol/week: 0.0 standard drinks  . Drug use: No  . Sexual activity: Not Currently  Lifestyle  . Physical activity:    Days per week: 0 days    Minutes per session: 0 min  . Stress: Not at all  Relationships  . Social connections:    Talks on phone: More than three times a week    Gets together: More than three times a week    Attends religious service: More than 4 times per year    Active member of club or organization: Yes    Attends meetings of clubs or organizations: More than 4 times per year    Relationship status: Married  . Intimate partner violence:    Fear of current or ex partner: No    Emotionally abused: No    Physically abused: No    Forced sexual activity: No  Other Topics Concern  . Not on file  Social History Narrative   Married -  current 79rd,    Two children from first marriage   He has 4 children from previous marriage    Review of Systems: See HPI, otherwise negative ROS  Physical Exam: BP 136/83   Pulse (!) 101   Temp 97.6 F (36.4 C) (Temporal)   Resp 16   Ht 5' 5.5" (1.664 m)   Wt 98.4 kg   SpO2 95%   BMI 35.56 kg/m  General:   Alert,  pleasant and cooperative in NAD Head:  Normocephalic and atraumatic. Neck:  Supple; no masses or thyromegaly. Lungs:  Clear throughout to auscultation.    Heart:  Regular rate and rhythm. Abdomen:  Soft, nontender and nondistended. Normal bowel sounds, without guarding, and without rebound.   Neurologic:  Alert and  oriented x4;  grossly normal neurologically.  Impression/Plan: [name] is here for an endoscopy and colonoscopy to be performed for gastric intestinal metaplasia and history of colon polyps  Risks, benefits, limitations, and alternatives regarding  endoscopy and colonoscopy have been reviewed with the patient.  Questions have been answered.  All parties agreeable.   Lucilla Lame, MD  09/02/2018, 8:20 AM

## 2018-09-03 ENCOUNTER — Encounter: Payer: Self-pay | Admitting: Gastroenterology

## 2018-09-05 ENCOUNTER — Encounter: Payer: Self-pay | Admitting: Gastroenterology

## 2018-09-05 ENCOUNTER — Telehealth: Payer: Self-pay

## 2018-09-05 NOTE — Telephone Encounter (Signed)
-----   Message from Lucilla Lame, MD sent at 09/05/2018  8:27 AM EST ----- Let the patient know that The biopsy did not show any cancer and that she needs a repeat EGD in 2 years.  Her colonoscopy is not due for 5 years.

## 2018-09-05 NOTE — Telephone Encounter (Signed)
Pt notified of EGD results.  

## 2018-09-05 NOTE — Telephone Encounter (Deleted)
-----   Message from Lucilla Lame, MD sent at 09/05/2018  8:27 AM EST ----- Let the patient know that The biopsy did not show any cancer and that she needs a repeat EGD in 2 years.  Her colonoscopy is not due for 5 years.

## 2018-09-20 ENCOUNTER — Other Ambulatory Visit: Payer: Self-pay | Admitting: Family Medicine

## 2018-09-20 DIAGNOSIS — G44309 Post-traumatic headache, unspecified, not intractable: Secondary | ICD-10-CM

## 2018-09-23 DIAGNOSIS — E1142 Type 2 diabetes mellitus with diabetic polyneuropathy: Secondary | ICD-10-CM | POA: Diagnosis not present

## 2018-09-23 DIAGNOSIS — B351 Tinea unguium: Secondary | ICD-10-CM | POA: Diagnosis not present

## 2018-09-23 DIAGNOSIS — L851 Acquired keratosis [keratoderma] palmaris et plantaris: Secondary | ICD-10-CM | POA: Diagnosis not present

## 2018-09-23 LAB — HM DIABETES FOOT EXAM

## 2018-09-25 ENCOUNTER — Telehealth: Payer: Self-pay

## 2018-09-25 NOTE — Telephone Encounter (Signed)
Ask her to stop medication first and see if symptoms resolves before changing medication

## 2018-09-25 NOTE — Telephone Encounter (Signed)
Left message with Dr. Ancil Boozer recommendation.

## 2018-09-25 NOTE — Telephone Encounter (Signed)
Pt called stating she stopped the medication on Sunday and as of today has no more itching. She is asking for different medication. Please advise.   [id number] [address], [place] [phone] (Phone) [phone] (Fax)

## 2018-09-25 NOTE — Telephone Encounter (Signed)
Patient states after taking Baclofen her right toe will itch and feels like it is coming from the inside of her foot. She is unable to tolerate it and would like to know if this is a reaction to the medication and if you could change it to something else. Patient states this symptom starts after taking the medication and could not figure it out at first but has pin pointed it to this prescription.

## 2018-09-26 NOTE — Telephone Encounter (Signed)
We can change to Tizanidine ( please verify she has not taken it before) 4 mg TID prn

## 2018-09-26 NOTE — Telephone Encounter (Signed)
Left message asking patient to call us back to see if she has taken Tizanidine before.

## 2018-09-27 ENCOUNTER — Other Ambulatory Visit: Payer: Self-pay | Admitting: Family Medicine

## 2018-09-27 MED ORDER — TIZANIDINE HCL 4 MG PO TABS: 4.0000 mg | ORAL_TABLET | Freq: Four times a day (QID) | ORAL | 0 refills | Status: DC | PRN

## 2018-09-27 NOTE — Telephone Encounter (Signed)
Copied from [place]. Topic: Quick Communication - Lab Results (Clinic Use ONLY) >> Sep 26, 2018  1:10 PM Vonna Kotyk, [place] wrote: We can change to Tizanidine ( please verify she has not taken it before) 4 mg TID prn >> Sep 26, 2018  1:32 PM Nils Flack, Melissa J wrote: Pt called referrals line back.  She has not tried tizanidine.  She is asking for call back on cell, as she will be leaving Thanks

## 2018-09-27 NOTE — Telephone Encounter (Signed)
Left message with patient's mother to give [place] a call back since she was at therapy. So we could inform her the Tizanidine has been send into her mail order pharmacy.

## 2018-09-27 NOTE — Telephone Encounter (Signed)
Changed to Tizanidine.

## 2018-10-01 DIAGNOSIS — E1165 Type 2 diabetes mellitus with hyperglycemia: Secondary | ICD-10-CM | POA: Diagnosis not present

## 2018-10-01 LAB — HEMOGLOBIN A1C: Hemoglobin A1C: 8.3

## 2018-10-01 LAB — BASIC METABOLIC PANEL
BUN: 16 (ref 4–21)
Glucose: 149
Potassium: 4.5 (ref 3.4–5.3)
Sodium: 139 (ref 137–147)

## 2018-10-07 ENCOUNTER — Ambulatory Visit (INDEPENDENT_AMBULATORY_CARE_PROVIDER_SITE_OTHER): Payer: Medicare HMO | Admitting: Family Medicine

## 2018-10-07 ENCOUNTER — Encounter: Payer: Self-pay | Admitting: Family Medicine

## 2018-10-07 VITALS — BP 132/76 | HR 91 | Temp 98.2°F | Resp 16 | Ht 60.0 in | Wt 222.6 lb

## 2018-10-07 DIAGNOSIS — E1169 Type 2 diabetes mellitus with other specified complication: Secondary | ICD-10-CM | POA: Diagnosis not present

## 2018-10-07 DIAGNOSIS — J069 Acute upper respiratory infection, unspecified: Secondary | ICD-10-CM | POA: Diagnosis not present

## 2018-10-07 DIAGNOSIS — E785 Hyperlipidemia, unspecified: Secondary | ICD-10-CM | POA: Diagnosis not present

## 2018-10-07 DIAGNOSIS — J209 Acute bronchitis, unspecified: Secondary | ICD-10-CM

## 2018-10-07 DIAGNOSIS — J42 Unspecified chronic bronchitis: Secondary | ICD-10-CM

## 2018-10-07 MED ORDER — FLUTICASONE FUROATE-VILANTEROL 100-25 MCG/INH IN AEPB: 1.0000 | INHALATION_SPRAY | Freq: Every day | RESPIRATORY_TRACT | 0 refills | Status: DC

## 2018-10-07 NOTE — Progress Notes (Signed)
Name: Dana Sparks   MRN: [id number]    DOB: Jan 06, 1951   Date:10/07/2018       Progress Note  Subjective  Chief Complaint  Chief Complaint  Patient presents with  . Sinus Problem  . Cough    Onset-last week, coughing up green thick mucus and sometimes bloody, hacking cough that will wake her up in the middle of the night-has been taking Mucinex with some relief.  . Sore Throat    HPI  URI: she states symptoms started about one week ago with scratch throat, no fever or chills. Followed by clear  rhinorrhea and nasal congestion. No facial pressure but she has noticed wheezing, no SOB, but has a productive cough that has been worse at night. She has tried otc medication without help. She used to smoke, she has recurrent productive cough with URI, we will check spirometry on her next visit.   Chronic bronchitis: with a flare, she has Diabetes and last hgbA1C done at Endocrinologist was 8.3%, we will avoid oral prednisone, we will try treating with [facility] and she will call back if needed  Patient Active Problem List   Diagnosis Date Noted  . Disease of stomach   . Hx of colonic polyps   . Hepatomegaly 07/23/2018  . Fatty liver 07/23/2018  . Aortic atherosclerosis ([place]) 07/09/2018  . Abnormal endocrine laboratory test finding 06/30/2018  . Type II or unspecified type diabetes mellitus with neurological manifestations, not stated as uncontrolled(250.60) 09/03/2017  . Nasal polyp 09/03/2017  . Lipoma of back 09/03/2017  . Claudication ([place]) 06/04/2017  . LVH (left ventricular hypertrophy) 02/27/2017  . Decreased cardiac ejection fraction 02/27/2017  . Left hand weakness 10/12/2016  . Elevated antinuclear antibody (ANA) level 04/27/2016  . Angina pectoris ([place]) 11/01/2015  . Well controlled type 2 diabetes mellitus with gastroparesis ([place]) 06/22/2015  . Low TSH level 06/22/2015  . Iron deficiency anemia 04/07/2015  . Intestinal metaplasia of gastric mucosa 03/11/2015  . CAD (coronary  artery disease), native coronary artery 02/24/2015  . History of coronary artery stent placement 02/24/2015  . Chronic kidney disease, stage 3, mod decreased GFR (HCC) 02/24/2015  . Menopause 02/24/2015  . History of Helicobacter pylori infection 02/24/2015  . Mild mitral insufficiency 02/24/2015  . Mild tricuspid insufficiency 02/24/2015  . 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 ([place]) 02/24/2015  . Controlled gout 02/24/2015  . Depression, major, recurrent, mild ([place]) 02/24/2015  . Morbid obesity due to excess calories ([place]) 02/24/2015  . Mild pulmonary hypertension ([place]) 02/24/2015  . Gastroesophageal reflux disease without esophagitis 02/24/2015  . Perennial allergic rhinitis 02/24/2015  . HLD (hyperlipidemia) 02/20/2015  . Hypertension, benign 02/20/2015  . Apnea, sleep 02/20/2015  . Controlled diabetes mellitus with stage 3 chronic kidney disease, without long-term current use of insulin ([place]) 02/20/2015    Past Surgical History:  Procedure Laterality Date  . ABDOMINAL HYSTERECTOMY     total  . CATARACT EXTRACTION    . COLONOSCOPY  01/2014   sigmoid diverticulosis. desc colon TA, hyperplastic polyp  . COLONOSCOPY WITH PROPOFOL N/A 09/02/2018   Procedure: COLONOSCOPY WITH PROPOFOL;  Surgeon: Lucilla Lame, MD;  Location: [facility];  Service: Endoscopy;  Laterality: N/A;  . CORONARY ANGIOPLASTY WITH STENT PLACEMENT    . ESOPHAGOGASTRODUODENOSCOPY N/A 02/16/2015   negative h pylori, focal gastic intestinal metaplasia  . ESOPHAGOGASTRODUODENOSCOPY (EGD) WITH PROPOFOL N/A 09/02/2018   Procedure: ESOPHAGOGASTRODUODENOSCOPY (EGD) WITH BIOPSIES;  Surgeon: Lucilla Lame, MD;  Location: [facility];  Service: Endoscopy;  Laterality: N/A;  diabetic -insulin and oral meds sleep apnea  . TOTAL KNEE ARTHROPLASTY Right     Family History  Problem Relation Age of Onset  . Stroke Sister   . Hyperlipidemia Sister   . Alcohol abuse Brother   .  Diabetes Brother   . Stroke Brother   . Hypertension Mother   . Diabetes Mother   . Heart disease Mother   . Diabetes Father   . Heart disease Father   . Hypertension Father   . Hypertension Sister   . Cancer Maternal Grandmother        Unsure  . Diabetes Maternal Grandfather   . Colon cancer Neg Hx   . Liver disease Neg Hx   . Breast cancer Neg Hx     Social History   Socioeconomic History  . Marital status: Married    Spouse name: Fritz Pickerel  . Number of children: 2  . Years of education: some college  . Highest education level: 12th grade  Occupational History  . Occupation: Disabled  Social Needs  . Financial resource strain: Hard  . Food insecurity:    Worry: Never true    Inability: Never true  . Transportation needs:    Medical: No    Non-medical: No  Tobacco Use  . Smoking status: Former Smoker    Packs/day: 1.00    Years: 40.00    Pack years: 40.00    Types: Cigarettes    Start date: 09/04/1972    Last attempt to quit: 12/30/2012    Years since quitting: 5.7  . Smokeless tobacco: Never Used  . Tobacco comment: smoking cessation materials not required  Substance and Sexual Activity  . Alcohol use: No    Alcohol/week: 0.0 standard drinks  . Drug use: No  . Sexual activity: Not Currently  Lifestyle  . Physical activity:    Days per week: 0 days    Minutes per session: 0 min  . Stress: Not at all  Relationships  . Social connections:    Talks on phone: More than three times a week    Gets together: More than three times a week    Attends religious service: More than 4 times per year    Active member of club or organization: Yes    Attends meetings of clubs or organizations: More than 4 times per year    Relationship status: Married  . Intimate partner violence:    Fear of current or ex partner: No    Emotionally abused: No    Physically abused: No    Forced sexual activity: No  Other Topics Concern  . Not on file  Social History Narrative   Married  - current 50rd,    Two children from first marriage   He has 4 children from previous marriage     Current Outpatient Medications:  .  ACCU-CHEK FASTCLIX LANCETS MISC, , Disp: , Rfl:  .  ACCU-CHEK SMARTVIEW test strip, Check fsbs two times daily  DMII, Disp: 100 each, Rfl: 6 .  Alcohol Swabs (B-D SINGLE USE SWABS REGULAR) PADS, 1 each by Does not apply route 4 (four) times daily., Disp: 200 each, Rfl: 2 .  allopurinol (ZYLOPRIM) 100 MG tablet, Take 1 tablet (100 mg total) by mouth daily., Disp: 90 tablet, Rfl: 3 .  aspirin EC 81 MG tablet, Take 81 mg by mouth every morning. , Disp: , Rfl:  .  BD INSULIN SYRINGE U/F 31G X 5/16" 0.3 ML  MISC, , Disp: , Rfl:  .  blood glucose meter kit and supplies KIT, Dispense based on patient and insurance preference. Use up to four times daily as directed. (FOR ICD-9 250.00, 250.01)., Disp: 1 each, Rfl: 0 .  clindamycin (CLEOCIN) 150 MG capsule, Take 150 mg by mouth 4 (four) times daily. Take 4 tablets at once one hour before dental appointment., Disp: , Rfl:  .  cloNIDine (CATAPRES) 0.1 MG tablet, Take 1 tablet (0.1 mg total) by mouth 2 (two) times daily as needed. Start taking 1 tablet at bedtime for sweating, may increase to twice daily as tolerated., Disp: 180 tablet, Rfl: 0 .  Coenzyme Q10 100 MG capsule, TAKE 1 CAPSULE EVERY DAY, Disp: 90 capsule, Rfl: 1 .  cyclobenzaprine (FLEXERIL) 5 MG tablet, Take 1 tablet (5 mg total) by mouth 3 (three) times daily as needed for muscle spasms., Disp: 8 tablet, Rfl: 0 .  Ferrous Sulfate (IRON) 325 (65 Fe) MG TABS, Take 325 mg by mouth 2 (two) times daily. Nature made brand-  Pt takes one tablet twice a day , Disp: , Rfl:  .  fluticasone (FLONASE) 50 MCG/ACT nasal spray, Place 2 sprays into both nostrils daily., Disp: 48 g, Rfl: 2 .  glipiZIDE (GLUCOTROL XL) 10 MG 24 hr tablet, TAKE 1 TABLET EVERY DAY WITH BREAKFAST, Disp: 90 tablet, Rfl: 1 .  insulin NPH Human (HUMULIN N,NOVOLIN N) 100 UNIT/ML injection, Inject 20-25  Units into the skin 2 (two) times daily before a meal. 20 units in the morning and 25 units at bedtime , Disp: , Rfl:  .  ipratropium (ATROVENT) 0.03 % nasal spray, Place 2 sprays into both nostrils every 12 (twelve) hours., Disp: 1 mL, Rfl: 0 .  isosorbide mononitrate (IMDUR) 60 MG 24 hr tablet, TAKE 1 TABLET EVERY DAY, Disp: 90 tablet, Rfl: 1 .  lisinopril (PRINIVIL,ZESTRIL) 20 MG tablet, Take 1 tablet (20 mg total) by mouth daily., Disp: 90 tablet, Rfl: 1 .  loratadine (CLARITIN) 10 MG tablet, Take 1 tablet (10 mg total) by mouth daily., Disp: 30 tablet, Rfl: 3 .  meloxicam (MOBIC) 7.5 MG tablet, Take by mouth., Disp: , Rfl:  .  metoprolol succinate (TOPROL-XL) 50 MG 24 hr tablet, Take 1 tablet (50 mg total) by mouth daily., Disp: 90 tablet, Rfl: 3 .  montelukast (SINGULAIR) 10 MG tablet, Take 1 tablet by mouth every evening., Disp: , Rfl:  .  nortriptyline (PAMELOR) 25 MG capsule, TAKE 1 CAPSULE (25 MG TOTAL) BY MOUTH AT BEDTIME., Disp: 30 capsule, Rfl: 0 .  pantoprazole (PROTONIX) 40 MG tablet, TAKE 1 TABLET EVERY MORNING, Disp: 90 tablet, Rfl: 1 .  rosuvastatin (CRESTOR) 20 MG tablet, TAKE 1 TABLET EVERY DAY, Disp: 90 tablet, Rfl: 1 .  tiZANidine (ZANAFLEX) 4 MG tablet, Take 1 tablet (4 mg total) by mouth every 6 (six) hours as needed for muscle spasms., Disp: 90 tablet, Rfl: 0 .  venlafaxine XR (EFFEXOR-XR) 75 MG 24 hr capsule, Take one tablet a day., Disp: 90 capsule, Rfl: 1 .  fluticasone furoate-vilanterol (BREO ELLIPTA) 100-25 MCG/INH AEPB, Inhale 1 puff into the lungs daily., Disp: 60 each, Rfl: 0  Allergies  Allergen Reactions  . Codeine Other (See Comments)    Unknown reaction  . Contrast Media [Iodinated Diagnostic Agents] Itching  . Sulfa Antibiotics Itching    I personally reviewed active problem list, medication list, allergies, family history, social history with the patient/caregiver today.   ROS  Ten systems reviewed and is negative except  as mentioned in  HPI  Objective  Vitals:   10/07/18 1029  BP: 132/76  Pulse: 91  Resp: 16  Temp: 98.2 F (36.8 C)  TempSrc: Oral  SpO2: 96%  Weight: 222 lb 9.6 oz (101 kg)  Height: 5' (1.524 m)    Body mass index is 43.47 kg/m.  Physical Exam  Constitutional: Patient appears well-developed and well-nourished. Obese  No distress.  HEENT: head atraumatic, normocephalic, pupils equal and reactive to light, ears normal TM bilateral  neck supple, throat within normal limits Cardiovascular: Normal rate, regular rhythm and normal heart sounds.  No murmur heard. No BLE edema. Pulmonary/Chest: Effort normal and breath sounds normal. No respiratory distress. Abdominal: Soft.  There is no tenderness. Psychiatric: Patient has a normal mood and affect. behavior is normal. Judgment and thought content normal.  Recent Results (from the past 2160 hour(s))  Lipase, blood     Status: None   Collection Time: 07/15/18 12:28 PM  Result Value Ref Range   Lipase 25 11 - 51 U/L    Comment: Performed at [facility], [address] [place], [place] [address]  Comprehensive metabolic panel     Status: Abnormal   Collection Time: 07/15/18 12:28 PM  Result Value Ref Range   Sodium 138 135 - 145 mmol/L   Potassium 3.4 (L) 3.5 - 5.1 mmol/L   Chloride 99 98 - 111 mmol/L   CO2 28 22 - 32 mmol/L   Glucose, Bld 100 (H) 70 - 99 mg/dL   BUN 13 8 - 23 mg/dL   Creatinine, Ser 0.68 0.44 - 1.00 mg/dL   Calcium 8.9 8.9 - 10.3 mg/dL   Total Protein 7.1 6.5 - 8.1 g/dL   Albumin 4.0 3.5 - 5.0 g/dL   AST 15 15 - 41 U/L   ALT 13 0 - 44 U/L   Alkaline Phosphatase 58 38 - 126 U/L   Total Bilirubin 0.6 0.3 - 1.2 mg/dL   GFR calc non Af Amer >60 >60 mL/min   GFR calc Af Amer >60 >60 mL/min    Comment: (NOTE) The eGFR has been calculated using the CKD EPI equation. This calculation has not been validated in all clinical situations. eGFR's persistently <60 mL/min signify possible Chronic Kidney Disease.    Anion  gap 11 5 - 15    Comment: Performed at [facility], [address] [place], [place] [address]  CBC     Status: None   Collection Time: 07/15/18 12:28 PM  Result Value Ref Range   WBC 8.6 4.0 - 10.5 K/uL   RBC 4.13 3.87 - 5.11 MIL/uL   Hemoglobin 12.8 12.0 - 15.0 g/dL   HCT 38.8 36.0 - 46.0 %   MCV 93.9 80.0 - 100.0 fL   MCH 31.0 26.0 - 34.0 pg   MCHC 33.0 30.0 - 36.0 g/dL   RDW 12.8 11.5 - 15.5 %   Platelets 275 150 - 400 K/uL   nRBC 0.0 0.0 - 0.2 %    Comment: Performed at [facility], [address] [place], [place] [address]  Urinalysis, Complete w Microscopic     Status: Abnormal   Collection Time: 07/15/18 12:29 PM  Result Value Ref Range   Color, Urine YELLOW (A) YELLOW   APPearance CLEAR (A) CLEAR   Specific Gravity, Urine 1.025 1.005 - 1.030   pH 6.0 5.0 - 8.0   Glucose, UA NEGATIVE NEGATIVE mg/dL   Hgb urine dipstick NEGATIVE NEGATIVE   Bilirubin Urine  NEGATIVE NEGATIVE   Ketones, ur 5 (A) NEGATIVE mg/dL   Protein, ur 30 (A) NEGATIVE mg/dL   Nitrite NEGATIVE NEGATIVE   Leukocytes, UA NEGATIVE NEGATIVE   RBC / HPF 0-5 0 - 5 RBC/hpf   WBC, UA 0-5 0 - 5 WBC/hpf   Bacteria, UA NONE SEEN NONE SEEN   Squamous Epithelial / LPF 0-5 0 - 5   Mucus PRESENT     Comment: Performed at [facility], [address] [place], [place] [address]  CBC with Differential     Status: None   Collection Time: 08/16/18  8:19 AM  Result Value Ref Range   WBC 6.0 4.0 - 10.5 K/uL   RBC 4.33 3.87 - 5.11 MIL/uL   Hemoglobin 13.1 12.0 - 15.0 g/dL   HCT 39.7 36.0 - 46.0 %   MCV 91.7 80.0 - 100.0 fL   MCH 30.3 26.0 - 34.0 pg   MCHC 33.0 30.0 - 36.0 g/dL   RDW 13.2 11.5 - 15.5 %   Platelets 277 150 - 400 K/uL   nRBC 0.0 0.0 - 0.2 %   Neutrophils Relative % 38 %   Neutro Abs 2.3 1.7 - 7.7 K/uL   Lymphocytes Relative 48 %   Lymphs Abs 3.0 0.7 - 4.0 K/uL   Monocytes Relative 8 %   Monocytes Absolute 0.5 0.1 - 1.0 K/uL   Eosinophils Relative 5 %    Eosinophils Absolute 0.3 0.0 - 0.5 K/uL   Basophils Relative 1 %   Basophils Absolute 0.0 0.0 - 0.1 K/uL   Immature Granulocytes 0 %   Abs Immature Granulocytes 0.01 0.00 - 0.07 K/uL    Comment: Performed at [facility], [address] [place], [place] [address]  Comprehensive metabolic panel     Status: Abnormal   Collection Time: 08/16/18  8:19 AM  Result Value Ref Range   Sodium 140 135 - 145 mmol/L   Potassium 3.5 3.5 - 5.1 mmol/L   Chloride 109 98 - 111 mmol/L   CO2 24 22 - 32 mmol/L   Glucose, Bld 152 (H) 70 - 99 mg/dL   BUN 15 8 - 23 mg/dL   Creatinine, Ser 0.71 0.44 - 1.00 mg/dL   Calcium 9.2 8.9 - 10.3 mg/dL   Total Protein 6.8 6.5 - 8.1 g/dL   Albumin 3.9 3.5 - 5.0 g/dL   AST 22 15 - 41 U/L   ALT 16 0 - 44 U/L   Alkaline Phosphatase 61 38 - 126 U/L   Total Bilirubin 0.5 0.3 - 1.2 mg/dL   GFR calc non Af Amer >60 >60 mL/min   GFR calc Af Amer >60 >60 mL/min   Anion gap 7 5 - 15    Comment: Performed at [facility], [address] [place], [place] [address]  HM COLONOSCOPY     Status: None   Collection Time: 09/02/18 12:00 AM  Result Value Ref Range   HM Colonoscopy See Report (in chart) See Report (in chart), Patient Reported    Comment: Dr. Allen Norris, Repeat in 5 years  Glucose, capillary     Status: Abnormal   Collection Time: 09/02/18  7:47 AM  Result Value Ref Range   Glucose-Capillary 154 (H) 70 - 99 mg/dL  Glucose, capillary     Status: Abnormal   Collection Time: 09/02/18  9:01 AM  Result Value Ref Range   Glucose-Capillary 137 (H) 70 - 99 mg/dL     PHQ2/9: Depression screen [facility] 2/9 08/29/2018  08/19/2018 07/23/2018 07/09/2018 06/05/2018  Decreased Interest 2 0 0 0 0  Down, Depressed, Hopeless 1 0 0 0 0  PHQ - 2 Score 3 0 0 0 0  Altered sleeping 0 0 0 0 0  Tired, decreased energy 1 0 0 0 0  Change in appetite 1 0 0 0 2  Feeling bad or failure about yourself  0 0 0 0 0  Trouble concentrating 1 0 0 0 0  Moving slowly or  fidgety/restless 0 0 0 0 0  Suicidal thoughts 0 0 0 0 0  PHQ-9 Score 6 0 0 0 -  Difficult doing work/chores Very difficult Not difficult at all Not difficult at all Not difficult at all Not difficult at all  Some recent data might be hidden     Fall Risk: Fall Risk  08/19/2018 07/23/2018 07/09/2018 06/05/2018 06/04/2018  Falls in the past year? '1 1 1 ' Yes Yes  Number falls in past yr: '1 1 1 1 1  ' Injury with Fall? 0 1 1 No No  Comment - - - - -  Risk for fall due to : - - History of fall(s) - -     Assessment & Plan  1. Chronic bronchitis with acute exacerbation (HCC)  - fluticasone furoate-vilanterol (BREO ELLIPTA) 100-25 MCG/INH AEPB; Inhale 1 puff into the lungs daily.  Dispense: 60 each; Refill: 0  2. Acute URI  Continue Mucinex, gargles, tylenol prn , fluids and rest    3. Dyslipidemia associated with type 2 diabetes mellitus ([place])  We will avoid prednisone because of DM

## 2018-10-11 DIAGNOSIS — E1142 Type 2 diabetes mellitus with diabetic polyneuropathy: Secondary | ICD-10-CM | POA: Diagnosis not present

## 2018-10-11 DIAGNOSIS — Z794 Long term (current) use of insulin: Secondary | ICD-10-CM | POA: Diagnosis not present

## 2018-10-11 DIAGNOSIS — E1159 Type 2 diabetes mellitus with other circulatory complications: Secondary | ICD-10-CM | POA: Diagnosis not present

## 2018-10-11 DIAGNOSIS — I10 Essential (primary) hypertension: Secondary | ICD-10-CM | POA: Diagnosis not present

## 2018-10-11 DIAGNOSIS — H2513 Age-related nuclear cataract, bilateral: Secondary | ICD-10-CM | POA: Diagnosis not present

## 2018-10-11 DIAGNOSIS — E1165 Type 2 diabetes mellitus with hyperglycemia: Secondary | ICD-10-CM | POA: Diagnosis not present

## 2018-10-11 LAB — HM DIABETES EYE EXAM

## 2018-10-16 ENCOUNTER — Ambulatory Visit: Payer: Medicare HMO | Admitting: Family Medicine

## 2018-10-24 ENCOUNTER — Encounter: Payer: Self-pay | Admitting: Family Medicine

## 2018-10-24 ENCOUNTER — Ambulatory Visit: Payer: Medicare HMO | Admitting: Family Medicine

## 2018-10-24 DIAGNOSIS — S060X0D Concussion without loss of consciousness, subsequent encounter: Secondary | ICD-10-CM | POA: Diagnosis not present
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
## 2018-11-07 ENCOUNTER — Ambulatory Visit (INDEPENDENT_AMBULATORY_CARE_PROVIDER_SITE_OTHER): Payer: Medicare HMO | Admitting: Family Medicine

## 2018-11-07 ENCOUNTER — Encounter: Payer: Self-pay | Admitting: Family Medicine

## 2018-11-07 VITALS — BP 126/72 | HR 81 | Temp 97.5°F | Resp 16 | Ht 60.0 in | Wt 222.3 lb

## 2018-11-07 DIAGNOSIS — I209 Angina pectoris, unspecified: Secondary | ICD-10-CM

## 2018-11-07 DIAGNOSIS — I10 Essential (primary) hypertension: Secondary | ICD-10-CM

## 2018-11-07 DIAGNOSIS — J209 Acute bronchitis, unspecified: Secondary | ICD-10-CM | POA: Diagnosis not present

## 2018-11-07 DIAGNOSIS — J42 Unspecified chronic bronchitis: Secondary | ICD-10-CM | POA: Diagnosis not present

## 2018-11-07 DIAGNOSIS — K219 Gastro-esophageal reflux disease without esophagitis: Secondary | ICD-10-CM

## 2018-11-07 DIAGNOSIS — G4733 Obstructive sleep apnea (adult) (pediatric): Secondary | ICD-10-CM

## 2018-11-07 DIAGNOSIS — J410 Simple chronic bronchitis: Secondary | ICD-10-CM | POA: Diagnosis not present

## 2018-11-07 DIAGNOSIS — E1169 Type 2 diabetes mellitus with other specified complication: Secondary | ICD-10-CM | POA: Diagnosis not present

## 2018-11-07 DIAGNOSIS — F330 Major depressive disorder, recurrent, mild: Secondary | ICD-10-CM | POA: Diagnosis not present

## 2018-11-07 DIAGNOSIS — I272 Pulmonary hypertension, unspecified: Secondary | ICD-10-CM

## 2018-11-07 DIAGNOSIS — E785 Hyperlipidemia, unspecified: Secondary | ICD-10-CM

## 2018-11-07 MED ORDER — VENLAFAXINE HCL ER 75 MG PO CP24: ORAL_CAPSULE | ORAL | 1 refills | Status: DC

## 2018-11-07 MED ORDER — ISOSORBIDE MONONITRATE ER 60 MG PO TB24: 60.0000 mg | ORAL_TABLET | Freq: Every day | ORAL | 1 refills | Status: DC

## 2018-11-07 MED ORDER — LISINOPRIL 20 MG PO TABS: 20.0000 mg | ORAL_TABLET | Freq: Every day | ORAL | 1 refills | Status: DC

## 2018-11-07 MED ORDER — PANTOPRAZOLE SODIUM 40 MG PO TBEC: 40.0000 mg | DELAYED_RELEASE_TABLET | Freq: Every morning | ORAL | 1 refills | Status: DC

## 2018-11-07 MED ORDER — ROSUVASTATIN CALCIUM 20 MG PO TABS: 20.0000 mg | ORAL_TABLET | Freq: Every day | ORAL | 1 refills | Status: DC

## 2018-11-07 MED ORDER — MONTELUKAST SODIUM 10 MG PO TABS: 10.0000 mg | ORAL_TABLET | Freq: Every evening | ORAL | 1 refills | Status: DC

## 2018-11-07 MED ORDER — UMECLIDINIUM-VILANTEROL 62.5-25 MCG/INH IN AEPB: 1.0000 | INHALATION_SPRAY | Freq: Every day | RESPIRATORY_TRACT | 1 refills | Status: DC

## 2018-11-07 NOTE — Progress Notes (Signed)
Name: Dana Sparks   MRN: [id number]    DOB: 07/14/1951   Date:11/07/2018       Progress Note  Subjective  Chief Complaint  Chief Complaint  Patient presents with  . Medication Refill  . Diabetes    Still seeing Dr. Gabriel Carina  . Hypertension  . Sleep Apnea  . Depression  . Hyperlipidemia  . Obesity    HPI   DMII with gastroparesis/CKI/neuropathy: she is taking medication as prescribed, seeing Dr. Gabriel Carina, she states occasionally has hypoglycemia at night, she states int he 4's a couple of times per week, she gets up and eats - sometimes too much during the night. Her hgbA1C  last hgbA1C 7.3 % , 7.7%  6.8% , 7.1%,7.3% ,7.8% and last one 8.3% . She is up to date with eye exam and foot exam.  Dr. Gabriel Carina recently adjusted dose of NPH to 25 units  am ad 30 units at night. She has occasional episodes of polyphagia, but no polydipsia or polyuria, advised to contact her office about the hypoglycemia, also advised to get rx for glucagon   HTN: she denies dizziness, but bp isat goal,no chest pain or SOB at this time.She is compliant with medication,. Currently taking medication as prescribed   Angina: CAD, her cardiologist is Dr. Clayborn Bigness, taking Imdur daily and Toprol XL and has chest pain is not as often at most once a month and lasting less than one minute now.She is also on statin therapy, aspirin and ace. She is on disability for it for many years, but now on social security.Still sees Dr. Clayborn Bigness, reviewed records and Echo from 2016 showed decreased in EF 25-30% and global hypokines.  OSA: she is using a CPAP machine most night, she needs to resume it every night.  She has mild pulmonary hypertension  Major Depression Mild: she is doing well on Effexor again, she was off medication and had severe emotional liability, back on medication for a few weeks and denies crying spells phq 9 went up a little, she states not much different than before, discussed going up on dose but she would  like to hold off until next visit since Spring is coming.   Morbid obesity:she lost 3 lbs since last visit , she is trying to eat healthy, she has been going to the [facility] , water aerobics class also uses the treadmill.   Hyperlipidemia: taking statin therapy,reviewed labs visit  No side effects of medication   Chronic bronchitis: spirometry today showed normal Fev1/FVC ration at 94%, she had a recent flare and used Breo with improvement of symptoms, she used to smoke 1 pack daily for 50 years, quit smoking in 2014 but still has daily morning cough that is occasionally productive that can be from clear to brown in color. Only occasionally has SOB. No wheezing. Discussed going on Anoro and she is wiling to try, we will save trelegy for flares   Patient Active Problem List   Diagnosis Date Noted  . Disease of stomach   . Hx of colonic polyps   . Hepatomegaly 07/23/2018  . Fatty liver 07/23/2018  . Aortic atherosclerosis ([place]) 07/09/2018  . Abnormal endocrine laboratory test finding 06/30/2018  . Type II or unspecified type diabetes mellitus with neurological manifestations, not stated as uncontrolled(250.60) 09/03/2017  . Nasal polyp 09/03/2017  . Lipoma of back 09/03/2017  . Claudication ([place]) 06/04/2017  . LVH (left ventricular hypertrophy) 02/27/2017  . Decreased cardiac ejection fraction 02/27/2017  . Left hand weakness 10/12/2016  .  Elevated antinuclear antibody (ANA) level 04/27/2016  . Angina pectoris ([place]) 11/01/2015  . Well controlled type 2 diabetes mellitus with gastroparesis ([place]) 06/22/2015  . Low TSH level 06/22/2015  . Iron deficiency anemia 04/07/2015  . Intestinal metaplasia of gastric mucosa 03/11/2015  . CAD (coronary artery disease), native coronary artery 02/24/2015  . History of coronary artery stent placement 02/24/2015  . Chronic kidney disease, stage 3, mod decreased GFR (HCC) 02/24/2015  . Menopause 02/24/2015  . History of Helicobacter pylori infection  02/24/2015  . Mild mitral insufficiency 02/24/2015  . Mild tricuspid insufficiency 02/24/2015  . 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 ([place]) 02/24/2015  . Controlled gout 02/24/2015  . Depression, major, recurrent, mild ([place]) 02/24/2015  . Morbid obesity due to excess calories ([place]) 02/24/2015  . Mild pulmonary hypertension ([place]) 02/24/2015  . Gastroesophageal reflux disease without esophagitis 02/24/2015  . Perennial allergic rhinitis 02/24/2015  . HLD (hyperlipidemia) 02/20/2015  . Hypertension, benign 02/20/2015  . Apnea, sleep 02/20/2015  . Controlled diabetes mellitus with stage 3 chronic kidney disease, without long-term current use of insulin ([place]) 02/20/2015    Past Surgical History:  Procedure Laterality Date  . ABDOMINAL HYSTERECTOMY     total  . CATARACT EXTRACTION    . COLONOSCOPY  01/2014   sigmoid diverticulosis. desc colon TA, hyperplastic polyp  . COLONOSCOPY WITH PROPOFOL N/A 09/02/2018   Procedure: COLONOSCOPY WITH PROPOFOL;  Surgeon: Lucilla Lame, MD;  Location: [place];  Service: Endoscopy;  Laterality: N/A;  . CORONARY ANGIOPLASTY WITH STENT PLACEMENT    . ESOPHAGOGASTRODUODENOSCOPY N/A 02/16/2015   negative h pylori, focal gastic intestinal metaplasia  . ESOPHAGOGASTRODUODENOSCOPY (EGD) WITH PROPOFOL N/A 09/02/2018   Procedure: ESOPHAGOGASTRODUODENOSCOPY (EGD) WITH BIOPSIES;  Surgeon: Lucilla Lame, MD;  Location: [place];  Service: Endoscopy;  Laterality: N/A;  diabetic -insulin and oral meds sleep apnea  . TOTAL KNEE ARTHROPLASTY Right     Family History  Problem Relation Age of Onset  . Stroke Sister   . Hyperlipidemia Sister   . Alcohol abuse Brother   . Diabetes Brother   . Stroke Brother   . Hypertension Mother   . Diabetes Mother   . Heart disease Mother   . Diabetes Father   . Heart disease Father   . Hypertension Father   . Hypertension Sister   . Cancer Maternal Grandmother        Unsure   . Diabetes Maternal Grandfather   . Colon cancer Neg Hx   . Liver disease Neg Hx   . Breast cancer Neg Hx     Social History   Socioeconomic History  . Marital status: Married    Spouse name: Fritz Pickerel  . Number of children: 2  . Years of education: some college  . Highest education level: 12th grade  Occupational History  . Occupation: Disabled  Social Needs  . Financial resource strain: Hard  . Food insecurity:    Worry: Never true    Inability: Never true  . Transportation needs:    Medical: No    Non-medical: No  Tobacco Use  . Smoking status: Former Smoker    Packs/day: 1.00    Years: 40.00    Pack years: 40.00    Types: Cigarettes    Start date: 09/04/1972    Last attempt to quit: 12/30/2012    Years since quitting: 5.8  . Smokeless tobacco: Never Used  . Tobacco comment: smoking cessation materials not required  Substance and Sexual Activity  .  Alcohol use: No    Alcohol/week: 0.0 standard drinks  . Drug use: No  . Sexual activity: Not Currently  Lifestyle  . Physical activity:    Days per week: 5 days    Minutes per session: 120 min  . Stress: Not at all  Relationships  . Social connections:    Talks on phone: More than three times a week    Gets together: More than three times a week    Attends religious service: More than 4 times per year    Active member of club or organization: Yes    Attends meetings of clubs or organizations: More than 4 times per year    Relationship status: Married  . Intimate partner violence:    Fear of current or ex partner: No    Emotionally abused: No    Physically abused: No    Forced sexual activity: No  Other Topics Concern  . Not on file  Social History Narrative   Married - current 64rd,    Two children from first marriage   He has 4 children from previous marriage     Current Outpatient Medications:  .  ACCU-CHEK FASTCLIX LANCETS MISC, , Disp: , Rfl:  .  ACCU-CHEK SMARTVIEW test strip, Check fsbs two times  daily  DMII, Disp: 100 each, Rfl: 6 .  Alcohol Swabs (B-D SINGLE USE SWABS REGULAR) PADS, 1 each by Does not apply route 4 (four) times daily., Disp: 200 each, Rfl: 2 .  aspirin EC 81 MG tablet, Take 81 mg by mouth every morning. , Disp: , Rfl:  .  BD INSULIN SYRINGE U/F 31G X 5/16" 0.3 ML MISC, , Disp: , Rfl:  .  blood glucose meter kit and supplies KIT, Dispense based on patient and insurance preference. Use up to four times daily as directed. (FOR ICD-9 250.00, 250.01)., Disp: 1 each, Rfl: 0 .  cloNIDine (CATAPRES) 0.1 MG tablet, Take 1 tablet (0.1 mg total) by mouth 2 (two) times daily as needed. Start taking 1 tablet at bedtime for sweating, may increase to twice daily as tolerated., Disp: 180 tablet, Rfl: 0 .  Coenzyme Q10 100 MG capsule, TAKE 1 CAPSULE EVERY DAY, Disp: 90 capsule, Rfl: 1 .  Ferrous Sulfate (IRON) 325 (65 Fe) MG TABS, Take 325 mg by mouth 2 (two) times daily. Nature made brand-  Pt takes one tablet twice a day , Disp: , Rfl:  .  fluticasone (FLONASE) 50 MCG/ACT nasal spray, Place 2 sprays into both nostrils daily., Disp: 48 g, Rfl: 2 .  fluticasone furoate-vilanterol (BREO ELLIPTA) 100-25 MCG/INH AEPB, Inhale 1 puff into the lungs daily., Disp: 60 each, Rfl: 0 .  glipiZIDE (GLUCOTROL XL) 10 MG 24 hr tablet, TAKE 1 TABLET EVERY DAY WITH BREAKFAST, Disp: 90 tablet, Rfl: 1 .  insulin NPH Human (HUMULIN N,NOVOLIN N) 100 UNIT/ML injection, Inject 25-30 Units into the skin 2 (two) times daily before a meal. 25 units in the morning and 30 units at bedtime, Disp: , Rfl:  .  ipratropium (ATROVENT) 0.03 % nasal spray, Place 2 sprays into both nostrils every 12 (twelve) hours., Disp: 1 mL, Rfl: 0 .  isosorbide mononitrate (IMDUR) 60 MG 24 hr tablet, TAKE 1 TABLET EVERY DAY, Disp: 90 tablet, Rfl: 1 .  lisinopril (PRINIVIL,ZESTRIL) 20 MG tablet, Take 1 tablet (20 mg total) by mouth daily., Disp: 90 tablet, Rfl: 1 .  loratadine (CLARITIN) 10 MG tablet, Take 1 tablet (10 mg total) by mouth  daily., Disp:  30 tablet, Rfl: 3 .  meloxicam (MOBIC) 7.5 MG tablet, Take by mouth., Disp: , Rfl:  .  metFORMIN (GLUCOPHAGE-XR) 500 MG 24 hr tablet, Take by mouth., Disp: , Rfl:  .  metoprolol succinate (TOPROL-XL) 50 MG 24 hr tablet, Take 1 tablet (50 mg total) by mouth daily., Disp: 90 tablet, Rfl: 3 .  montelukast (SINGULAIR) 10 MG tablet, Take 1 tablet by mouth every evening., Disp: , Rfl:  .  pantoprazole (PROTONIX) 40 MG tablet, TAKE 1 TABLET EVERY MORNING, Disp: 90 tablet, Rfl: 1 .  rosuvastatin (CRESTOR) 20 MG tablet, TAKE 1 TABLET EVERY DAY, Disp: 90 tablet, Rfl: 1 .  venlafaxine XR (EFFEXOR-XR) 75 MG 24 hr capsule, Take one tablet a day., Disp: 90 capsule, Rfl: 1 .  allopurinol (ZYLOPRIM) 100 MG tablet, Take 1 tablet (100 mg total) by mouth daily. (Patient not taking: Reported on 11/07/2018), Disp: 90 tablet, Rfl: 3 .  clindamycin (CLEOCIN) 150 MG capsule, Take 150 mg by mouth 4 (four) times daily. Take 4 tablets at once one hour before dental appointment., Disp: , Rfl:  .  cyclobenzaprine (FLEXERIL) 5 MG tablet, Take 1 tablet (5 mg total) by mouth 3 (three) times daily as needed for muscle spasms. (Patient not taking: Reported on 11/07/2018), Disp: 8 tablet, Rfl: 0  Allergies  Allergen Reactions  . Codeine Other (See Comments)    Unknown reaction  . Contrast Media [Iodinated Diagnostic Agents] Itching  . Sulfa Antibiotics Itching    I personally reviewed active problem list, medication list, allergies, family history, social history with the patient/caregiver today.   ROS  Constitutional: Negative for fever or weight change.  Respiratory: positive for cough and intermittent shortness of breath.   Cardiovascular: positive  for intermittent chest pain but no palpitations.  Gastrointestinal: Negative for abdominal pain, no bowel changes.  Musculoskeletal: Negative for gait problem or joint swelling.  Skin: Negative for rash.  Neurological: Negative for dizziness ( except when she  stoops quickly and goes up)  or headache.  No other specific complaints in a complete review of systems (except as listed in HPI above).   Objective  Vitals:   11/07/18 0912  BP: 126/72  Pulse: 81  Resp: 16  Temp: (!) 97.5 F (36.4 C)  TempSrc: Oral  SpO2: 96%  Weight: 222 lb 4.8 oz (100.8 kg)  Height: 5' (1.524 m)    Body mass index is 43.41 kg/m.  Physical Exam  Constitutional: Patient appears well-developed and well-nourished. Obese  No distress.  HEENT: head atraumatic, normocephalic, pupils equal and reactive to light,  neck supple, throat within normal limits Cardiovascular: Normal rate, regular rhythm and normal heart sounds.  No murmur heard. No BLE edema. Pulmonary/Chest: Effort normal and breath sounds normal. No respiratory distress. Abdominal: Soft.  There is no tenderness. Psychiatric: Patient has a normal mood and affect. behavior is normal. Judgment and thought content normal.  Recent Results (from the past 2160 hour(s))  CBC with Differential     Status: None   Collection Time: 08/16/18  8:19 AM  Result Value Ref Range   WBC 6.0 4.0 - 10.5 K/uL   RBC 4.33 3.87 - 5.11 MIL/uL   Hemoglobin 13.1 12.0 - 15.0 g/dL   HCT 39.7 36.0 - 46.0 %   MCV 91.7 80.0 - 100.0 fL   MCH 30.3 26.0 - 34.0 pg   MCHC 33.0 30.0 - 36.0 g/dL   RDW 13.2 11.5 - 15.5 %   Platelets 277 150 - 400 K/uL  nRBC 0.0 0.0 - 0.2 %   Neutrophils Relative % 38 %   Neutro Abs 2.3 1.7 - 7.7 K/uL   Lymphocytes Relative 48 %   Lymphs Abs 3.0 0.7 - 4.0 K/uL   Monocytes Relative 8 %   Monocytes Absolute 0.5 0.1 - 1.0 K/uL   Eosinophils Relative 5 %   Eosinophils Absolute 0.3 0.0 - 0.5 K/uL   Basophils Relative 1 %   Basophils Absolute 0.0 0.0 - 0.1 K/uL   Immature Granulocytes 0 %   Abs Immature Granulocytes 0.01 0.00 - 0.07 K/uL    Comment: Performed at [facility], [address] [place], [place] [address]  Comprehensive metabolic panel     Status: Abnormal   Collection Time:  08/16/18  8:19 AM  Result Value Ref Range   Sodium 140 135 - 145 mmol/L   Potassium 3.5 3.5 - 5.1 mmol/L   Chloride 109 98 - 111 mmol/L   CO2 24 22 - 32 mmol/L   Glucose, Bld 152 (H) 70 - 99 mg/dL   BUN 15 8 - 23 mg/dL   Creatinine, Ser 0.71 0.44 - 1.00 mg/dL   Calcium 9.2 8.9 - 10.3 mg/dL   Total Protein 6.8 6.5 - 8.1 g/dL   Albumin 3.9 3.5 - 5.0 g/dL   AST 22 15 - 41 U/L   ALT 16 0 - 44 U/L   Alkaline Phosphatase 61 38 - 126 U/L   Total Bilirubin 0.5 0.3 - 1.2 mg/dL   GFR calc non Af Amer >60 >60 mL/min   GFR calc Af Amer >60 >60 mL/min   Anion gap 7 5 - 15    Comment: Performed at [facility], [address] [place], [place] [address]  HM COLONOSCOPY     Status: None   Collection Time: 09/02/18 12:00 AM  Result Value Ref Range   HM Colonoscopy See Report (in chart) See Report (in chart), Patient Reported    Comment: Dr. Allen Norris, Repeat in 5 years  Glucose, capillary     Status: Abnormal   Collection Time: 09/02/18  7:47 AM  Result Value Ref Range   Glucose-Capillary 154 (H) 70 - 99 mg/dL  Glucose, capillary     Status: Abnormal   Collection Time: 09/02/18  9:01 AM  Result Value Ref Range   Glucose-Capillary 137 (H) 70 - 99 mg/dL  Hemoglobin A1c     Status: None   Collection Time: 10/01/18 12:00 AM  Result Value Ref Range   Hemoglobin A1C 3.7%   Basic metabolic panel     Status: None   Collection Time: 10/01/18 12:00 AM  Result Value Ref Range   Glucose 149    BUN 16 4 - 21   Potassium 4.5 3.4 - 5.3   Sodium 139 137 - 147  HM DIABETES EYE EXAM     Status: None   Collection Time: 10/11/18 12:00 AM  Result Value Ref Range   HM Diabetic Eye Exam No Retinopathy No Retinopathy     PHQ2/9: Depression screen [facility] 2/9 11/07/2018 08/29/2018 08/19/2018 07/23/2018 07/09/2018  Decreased Interest 2 2 0 0 0  Down, Depressed, Hopeless 1 1 0 0 0  PHQ - 2 Score 3 3 0 0 0  Altered sleeping 0 0 0 0 0  Tired, decreased energy 1 1 0 0 0  Change in appetite 3 1 0 0 0   Feeling bad or failure about yourself  0 0 0 0 0  Trouble concentrating 0 1  0 0 0  Moving slowly or fidgety/restless 0 0 0 0 0  Suicidal thoughts 0 0 0 0 0  PHQ-9 Score 7 6 0 0 0  Difficult doing work/chores Somewhat difficult Very difficult Not difficult at all Not difficult at all Not difficult at all  Some recent data might be hidden     Fall Risk: Fall Risk  11/07/2018 08/19/2018 07/23/2018 07/09/2018 06/05/2018  Falls in the past year? _0 Yes  Number falls in past yr: 0 _1 Injury with Fall? 0 0 1 1 No  Comment - - - - -  Risk for fall due to : - - - History of fall(s) -     Functional Status Survey: Is the patient deaf or have difficulty hearing?: No Does the patient have difficulty seeing, even when wearing glasses/contacts?: Yes Does the patient have difficulty concentrating, remembering, or making decisions?: No Does the patient have difficulty walking or climbing stairs?: No Does the patient have difficulty dressing or bathing?: No Does the patient have difficulty doing errands alone such as visiting a doctor's office or shopping?: No    Assessment & Plan  1. Simple chronic bronchitis ([place])  - Spirometry with Graph - umeclidinium-vilanterol (ANORO ELLIPTA) 62.5-25 MCG/INH AEPB; Inhale 1 puff into the lungs daily.  Dispense: 270 each; Refill: 1  2. Obstructive sleep apnea syndrome  Wearing most nights  3. Hypertension, benign  - lisinopril (PRINIVIL,ZESTRIL) 20 MG tablet; Take 1 tablet (20 mg total) by mouth daily.  Dispense: 90 tablet; Refill: 1  4. Depression, major, recurrent, mild (HCC)  - venlafaxine XR (EFFEXOR-XR) 75 MG 24 hr capsule; Take one tablet a day.  Dispense: 90 capsule; Refill: 1  5. Dyslipidemia associated with type 2 diabetes mellitus ([place])   6. Angina pectoris (HCC)  - isosorbide mononitrate (IMDUR) 60 MG 24 hr tablet; Take 1 tablet (60 mg total) by mouth daily.  Dispense: 90 tablet; Refill: 1  7. Gastroesophageal reflux  disease without esophagitis  - pantoprazole (PROTONIX) 40 MG tablet; Take 1 tablet (40 mg total) by mouth every morning.  Dispense: 90 tablet; Refill: 1  8. Mild pulmonary hypertension ([place])  Continue follow up with Dr. Clayborn Bigness and CPAP machine  9. Dyslipidemia  - rosuvastatin (CRESTOR) 20 MG tablet; Take 1 tablet (20 mg total) by mouth daily.  Dispense: 90 tablet; Refill: 1

## 2018-11-13 ENCOUNTER — Telehealth: Payer: Self-pay

## 2018-11-13 NOTE — Telephone Encounter (Signed)
She needs rx for supplies sent to [facility]

## 2018-11-13 NOTE — Telephone Encounter (Signed)
Form for [employer] filled out- will have Dr. Ancil Boozer sign and fax over.   Copied from [place] [id number]. Topic: General - Other >> Nov 13, 2018  9:32 AM Bea Graff, NT wrote: Reason for CRM: Pt states that [facility] and wanted to inform the office.

## 2018-12-04 DIAGNOSIS — E1143 Type 2 diabetes mellitus with diabetic autonomic (poly)neuropathy: Secondary | ICD-10-CM | POA: Diagnosis not present

## 2018-12-04 DIAGNOSIS — B353 Tinea pedis: Secondary | ICD-10-CM | POA: Diagnosis not present

## 2018-12-04 DIAGNOSIS — L851 Acquired keratosis [keratoderma] palmaris et plantaris: Secondary | ICD-10-CM | POA: Diagnosis not present

## 2018-12-25 ENCOUNTER — Encounter: Admission: RE | Payer: Self-pay | Source: Home / Self Care

## 2018-12-25 ENCOUNTER — Ambulatory Visit: Admission: RE | Admit: 2018-12-25 | Payer: Medicare HMO | Source: Home / Self Care | Admitting: Ophthalmology

## 2018-12-25 SURGERY — PHACOEMULSIFICATION, CATARACT, WITH IOL INSERTION
Anesthesia: Topical | Laterality: Left

## 2018-12-31 ENCOUNTER — Other Ambulatory Visit: Payer: Self-pay

## 2018-12-31 ENCOUNTER — Ambulatory Visit (INDEPENDENT_AMBULATORY_CARE_PROVIDER_SITE_OTHER): Payer: Self-pay | Admitting: Family Medicine

## 2018-12-31 ENCOUNTER — Encounter: Payer: Self-pay | Admitting: Family Medicine

## 2018-12-31 VITALS — BP 120/80 | HR 78 | Temp 97.8°F | Ht 63.5 in | Wt 224.0 lb

## 2018-12-31 DIAGNOSIS — F0781 Postconcussional syndrome: Secondary | ICD-10-CM

## 2018-12-31 DIAGNOSIS — F331 Major depressive disorder, recurrent, moderate: Secondary | ICD-10-CM

## 2018-12-31 DIAGNOSIS — F4310 Post-traumatic stress disorder, unspecified: Secondary | ICD-10-CM
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
## 2019-01-06 DIAGNOSIS — G4733 Obstructive sleep apnea (adult) (pediatric): Secondary | ICD-10-CM | POA: Diagnosis not present

## 2019-01-07 DIAGNOSIS — E1142 Type 2 diabetes mellitus with diabetic polyneuropathy: Secondary | ICD-10-CM | POA: Diagnosis not present

## 2019-01-07 DIAGNOSIS — E1165 Type 2 diabetes mellitus with hyperglycemia: Secondary | ICD-10-CM | POA: Diagnosis not present

## 2019-01-07 DIAGNOSIS — B353 Tinea pedis: Secondary | ICD-10-CM | POA: Diagnosis not present

## 2019-01-07 LAB — HEMOGLOBIN A1C: Hemoglobin A1C: 7.8

## 2019-01-15 ENCOUNTER — Encounter: Payer: Self-pay | Admitting: Family Medicine

## 2019-01-15 ENCOUNTER — Other Ambulatory Visit: Payer: Self-pay

## 2019-01-15 ENCOUNTER — Ambulatory Visit (INDEPENDENT_AMBULATORY_CARE_PROVIDER_SITE_OTHER): Payer: Medicare HMO | Admitting: Family Medicine

## 2019-01-15 VITALS — BP 100/70 | HR 70 | Temp 97.6°F | Resp 16 | Ht 63.5 in | Wt 224.0 lb

## 2019-01-15 DIAGNOSIS — E1165 Type 2 diabetes mellitus with hyperglycemia: Secondary | ICD-10-CM | POA: Diagnosis not present

## 2019-01-15 DIAGNOSIS — Z01419 Encounter for gynecological examination (general) (routine) without abnormal findings: Secondary | ICD-10-CM

## 2019-01-15 DIAGNOSIS — E1159 Type 2 diabetes mellitus with other circulatory complications: Secondary | ICD-10-CM | POA: Diagnosis not present

## 2019-01-15 DIAGNOSIS — E1142 Type 2 diabetes mellitus with diabetic polyneuropathy: Secondary | ICD-10-CM | POA: Diagnosis not present

## 2019-01-15 NOTE — Progress Notes (Signed)
Name: Dana Sparks   MRN: [id number]    DOB: August 15, 1951   Date:01/15/2019       Progress Note  Subjective  Chief Complaint  Chief Complaint  Patient presents with  . Annual Exam    HPI   Patient presents for annual CPE   Diet: eating more vegetables, no longer frying her food, she likes rice but switched to brown rice instead of white  Exercise: not physically active   USPSTF grade A and B recommendations    Office Visit from 01/15/2019 in [facility]  AUDIT-C Score  0     Depression: Phq 9 is  Positive but improving  Depression screen [employer] [facility] 2/9 01/15/2019 12/31/2018 11/07/2018 08/29/2018 08/19/2018  Decreased Interest _0 0  Down, Depressed, Hopeless 0 _1 0  PHQ - 2 Score _2 0  Altered sleeping 0 3 0 0 0  Tired, decreased energy _3 0  Change in appetite 0 _4 0  Feeling bad or failure about yourself  0 1 0 0 0  Trouble concentrating 1 2 0 1 0  Moving slowly or fidgety/restless 0 0 0 0 0  Suicidal thoughts 0 0 0 0 0  PHQ-9 Score _5 0  Difficult doing work/chores Somewhat difficult Somewhat difficult Somewhat difficult Very difficult Not difficult at all  Some recent data might be hidden   Hypertension: BP Readings from Last 3 Encounters:  01/15/19 100/70  12/31/18 120/80  11/07/18 126/72   Obesity: Wt Readings from Last 3 Encounters:  01/15/19 224 lb (101.6 kg)  12/31/18 224 lb (101.6 kg)  11/07/18 222 lb 4.8 oz (100.8 kg)   BMI Readings from Last 3 Encounters:  01/15/19 39.06 kg/m  12/31/18 39.06 kg/m  11/07/18 43.41 kg/m    Hep C Screening: negative 2014  STD testing and prevention (HIV/chl/gon/syphilis): N/A Intimate partner violence: negative screen  Sexual History/Pain during Intercourse:not sexually active  Menstrual History/LMP/Abnormal Bleeding: s/p hysterectomy  Incontinence Symptoms: none   Advanced Care Planning: A voluntary discussion about advance care planning including the explanation and  discussion of advance directives.  Discussed health care proxy and Living will, and the patient was able to identify a health care proxy as Holley Raring - her daughter  Patient does not have a living will at present time.   Breast cancer: 04/2018  BRCA gene screening: N/A Cervical cancer screening: N/A - s/p hysterectomy benign causes  Osteoporosis Screening: 04/2017 - osteopenia spine   Lipids:  Lab Results  Component Value Date   CHOL 126 07/09/2018   CHOL 128 10/03/2017   CHOL 121 02/27/2017   Lab Results  Component Value Date   HDL 50 (L) 07/09/2018   HDL 54 10/03/2017   HDL 49 (L) 02/27/2017   Lab Results  Component Value Date   LDLCALC 62 07/09/2018   LDLCALC 55 10/03/2017   LDLCALC 56 02/27/2017   Lab Results  Component Value Date   TRIG 65 07/09/2018   TRIG 107 10/03/2017   TRIG 82 02/27/2017   Lab Results  Component Value Date   CHOLHDL 2.5 07/09/2018   CHOLHDL 2.4 10/03/2017   CHOLHDL 2.5 02/27/2017   No results found for: LDLDIRECT  Glucose:  Glucose  Date Value Ref Range Status  05/15/2012 155 (H) 65 - 99 mg/dL Final  05/08/2012 80 65 - 99 mg/dL Final  02/07/2012 144 (H) 65 - 99 mg/dL Final   Glucose, Bld  Date Value Ref Range Status  08/16/2018 152 (H) 70 - 99 mg/dL Final  07/15/2018 100 (H) 70 - 99 mg/dL Final  06/04/2018 142 (H) 65 - 99 mg/dL Final    Comment:    .            Fasting reference interval . For someone without known diabetes, a glucose value >125 mg/dL indicates that they may have diabetes and this should be confirmed with a follow-up test. .    Glucose-Capillary  Date Value Ref Range Status  09/02/2018 137 (H) 70 - 99 mg/dL Final  09/02/2018 154 (H) 70 - 99 mg/dL Final  05/02/2016 281 (H) 65 - 99 mg/dL Final    Skin cancer: discussed atypical lesions  Colorectal cancer: 09/02/2018 Lung cancer:   Low Dose CT Chest recommended if Age 87-80 years, 30 pack-year currently smoking OR have quit w/in 15years. Patient does  qualify.   ECG:01/2017   Patient Active Problem List   Diagnosis Date Noted  . Disease of stomach   . Hx of colonic polyps   . Hepatomegaly 07/23/2018  . Fatty liver 07/23/2018  . Aortic atherosclerosis ([place]) 07/09/2018  . Abnormal endocrine laboratory test finding 06/30/2018  . Type II or unspecified type diabetes mellitus with neurological manifestations, not stated as uncontrolled(250.60) 09/03/2017  . Nasal polyp 09/03/2017  . Lipoma of back 09/03/2017  . Claudication ([place]) 06/04/2017  . LVH (left ventricular hypertrophy) 02/27/2017  . Decreased cardiac ejection fraction 02/27/2017  . Left hand weakness 10/12/2016  . Elevated antinuclear antibody (ANA) level 04/27/2016  . Angina pectoris ([place]) 11/01/2015  . Well controlled type 2 diabetes mellitus with gastroparesis ([place]) 06/22/2015  . Low TSH level 06/22/2015  . Iron deficiency anemia 04/07/2015  . Intestinal metaplasia of gastric mucosa 03/11/2015  . CAD (coronary artery disease), native coronary artery 02/24/2015  . History of coronary artery stent placement 02/24/2015  . Chronic kidney disease, stage 3, mod decreased GFR (HCC) 02/24/2015  . Menopause 02/24/2015  . History of Helicobacter pylori infection 02/24/2015  . Mild mitral insufficiency 02/24/2015  . Mild tricuspid insufficiency 02/24/2015  . 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 ([place]) 02/24/2015  . Controlled gout 02/24/2015  . Depression, major, recurrent, mild ([place]) 02/24/2015  . Morbid obesity due to excess calories ([place]) 02/24/2015  . Mild pulmonary hypertension ([place]) 02/24/2015  . Gastroesophageal reflux disease without esophagitis 02/24/2015  . Perennial allergic rhinitis 02/24/2015  . HLD (hyperlipidemia) 02/20/2015  . Hypertension, benign 02/20/2015  . Apnea, sleep 02/20/2015  . Controlled diabetes mellitus with stage 3 chronic kidney disease, without long-term current use of insulin ([place]) 02/20/2015    Past Surgical  History:  Procedure Laterality Date  . ABDOMINAL HYSTERECTOMY     total  . CATARACT EXTRACTION    . COLONOSCOPY  01/2014   sigmoid diverticulosis. desc colon TA, hyperplastic polyp  . COLONOSCOPY WITH PROPOFOL N/A 09/02/2018   Procedure: COLONOSCOPY WITH PROPOFOL;  Surgeon: Lucilla Lame, MD;  Location: [place];  Service: Endoscopy;  Laterality: N/A;  . CORONARY ANGIOPLASTY WITH STENT PLACEMENT    . ESOPHAGOGASTRODUODENOSCOPY N/A 02/16/2015   negative h pylori, focal gastic intestinal metaplasia  . ESOPHAGOGASTRODUODENOSCOPY (EGD) WITH PROPOFOL N/A 09/02/2018   Procedure: ESOPHAGOGASTRODUODENOSCOPY (EGD) WITH BIOPSIES;  Surgeon: Lucilla Lame, MD;  Location: [place];  Service: Endoscopy;  Laterality: N/A;  diabetic -insulin and oral meds sleep apnea  . TOTAL KNEE ARTHROPLASTY Right     Family History  Problem Relation Age of Onset  .  Stroke Sister   . Hyperlipidemia Sister   . Alcohol abuse Brother   . Diabetes Brother   . Stroke Brother   . Hypertension Mother   . Diabetes Mother   . Heart disease Mother   . Diabetes Father   . Heart disease Father   . Hypertension Father   . Hypertension Sister   . Cancer Maternal Grandmother        Unsure  . Diabetes Maternal Grandfather   . Colon cancer Neg Hx   . Liver disease Neg Hx   . Breast cancer Neg Hx     Social History   Socioeconomic History  . Marital status: Married    Spouse name: Fritz Pickerel  . Number of children: 2  . Years of education: some college  . Highest education level: 12th grade  Occupational History  . Occupation: Disabled  Social Needs  . Financial resource strain: Hard  . Food insecurity:    Worry: Never true    Inability: Never true  . Transportation needs:    Medical: No    Non-medical: No  Tobacco Use  . Smoking status: Former Smoker    Packs/day: 1.00    Years: 40.00    Pack years: 40.00    Types: Cigarettes    Start date: 09/04/1972    Last attempt to quit: 12/30/2012     Years since quitting: 6.0  . Smokeless tobacco: Never Used  . Tobacco comment: smoking cessation materials not required  Substance and Sexual Activity  . Alcohol use: No    Alcohol/week: 0.0 standard drinks  . Drug use: No  . Sexual activity: Not Currently  Lifestyle  . Physical activity:    Days per week: 5 days    Minutes per session: 120 min  . Stress: Not at all  Relationships  . Social connections:    Talks on phone: More than three times a week    Gets together: More than three times a week    Attends religious service: More than 4 times per year    Active member of club or organization: Yes    Attends meetings of clubs or organizations: More than 4 times per year    Relationship status: Married  . Intimate partner violence:    Fear of current or ex partner: No    Emotionally abused: No    Physically abused: No    Forced sexual activity: No  Other Topics Concern  . Not on file  Social History Narrative   Married - current 16rd,    Two children from first marriage   He has 4 children from previous marriage     Current Outpatient Medications:  .  ACCU-CHEK FASTCLIX LANCETS MISC, , Disp: , Rfl:  .  ACCU-CHEK SMARTVIEW test strip, Check fsbs two times daily  DMII, Disp: 100 each, Rfl: 6 .  Alcohol Swabs (B-D SINGLE USE SWABS REGULAR) PADS, 1 each by Does not apply route 4 (four) times daily., Disp: 200 each, Rfl: 2 .  allopurinol (ZYLOPRIM) 100 MG tablet, , Disp: , Rfl:  .  aspirin EC 81 MG tablet, Take 81 mg by mouth every morning. , Disp: , Rfl:  .  BD INSULIN SYRINGE U/F 31G X 5/16" 0.3 ML MISC, , Disp: , Rfl:  .  blood glucose meter kit and supplies KIT, Dispense based on patient and insurance preference. Use up to four times daily as directed. (FOR ICD-9 250.00, 250.01)., Disp: 1 each, Rfl: 0 .  Cholecalciferol (VITAMIN  D-3 PO), Take 1,000 Units by mouth 2 (two) times a day., Disp: , Rfl:  .  cloNIDine (CATAPRES) 0.1 MG tablet, Take 1 tablet (0.1 mg total) by mouth 2  (two) times daily as needed. Start taking 1 tablet at bedtime for sweating, may increase to twice daily as tolerated., Disp: 180 tablet, Rfl: 0 .  Coenzyme Q10 100 MG capsule, TAKE 1 CAPSULE EVERY DAY, Disp: 90 capsule, Rfl: 1 .  cyclobenzaprine (FLEXERIL) 5 MG tablet, Take 1 tablet (5 mg total) by mouth 3 (three) times daily as needed for muscle spasms., Disp: 8 tablet, Rfl: 0 .  Ferrous Sulfate (IRON) 325 (65 Fe) MG TABS, Take 325 mg by mouth 2 (two) times daily. Nature made brand-  Pt takes one tablet twice a day , Disp: , Rfl:  .  fluticasone (FLONASE) 50 MCG/ACT nasal spray, Place 2 sprays into both nostrils daily., Disp: 48 g, Rfl: 2 .  glipiZIDE (GLUCOTROL XL) 10 MG 24 hr tablet, TAKE 1 TABLET EVERY DAY WITH BREAKFAST, Disp: 90 tablet, Rfl: 1 .  insulin NPH Human (HUMULIN N,NOVOLIN N) 100 UNIT/ML injection, Inject 25-30 Units into the skin 2 (two) times daily before a meal. 25 units in the morning and 30 units at bedtime, Disp: , Rfl:  .  ipratropium (ATROVENT) 0.03 % nasal spray, Place 2 sprays into both nostrils every 12 (twelve) hours., Disp: 1 mL, Rfl: 0 .  isosorbide mononitrate (IMDUR) 60 MG 24 hr tablet, Take 1 tablet (60 mg total) by mouth daily., Disp: 90 tablet, Rfl: 1 .  lisinopril (PRINIVIL,ZESTRIL) 20 MG tablet, Take 1 tablet (20 mg total) by mouth daily., Disp: 90 tablet, Rfl: 1 .  loratadine (CLARITIN) 10 MG tablet, Take 1 tablet (10 mg total) by mouth daily., Disp: 30 tablet, Rfl: 3 .  meloxicam (MOBIC) 7.5 MG tablet, Take by mouth., Disp: , Rfl:  .  metFORMIN (GLUCOPHAGE-XR) 500 MG 24 hr tablet, Take by mouth., Disp: , Rfl:  .  metoprolol succinate (TOPROL-XL) 50 MG 24 hr tablet, Take 1 tablet (50 mg total) by mouth daily., Disp: 90 tablet, Rfl: 3 .  montelukast (SINGULAIR) 10 MG tablet, Take 1 tablet (10 mg total) by mouth every evening., Disp: 90 tablet, Rfl: 1 .  pantoprazole (PROTONIX) 40 MG tablet, Take 1 tablet (40 mg total) by mouth every morning., Disp: 90 tablet, Rfl:  1 .  rosuvastatin (CRESTOR) 20 MG tablet, Take 1 tablet (20 mg total) by mouth daily., Disp: 90 tablet, Rfl: 1 .  umeclidinium-vilanterol (ANORO ELLIPTA) 62.5-25 MCG/INH AEPB, Inhale 1 puff into the lungs daily., Disp: 270 each, Rfl: 1 .  venlafaxine XR (EFFEXOR-XR) 75 MG 24 hr capsule, Take one tablet a day., Disp: 90 capsule, Rfl: 1  Allergies  Allergen Reactions  . Codeine Other (See Comments)    Unknown reaction  . Contrast Media [Iodinated Diagnostic Agents] Itching  . Sulfa Antibiotics Itching     ROS  Constitutional: Negative for fever or weight change.  Respiratory: Negative for cough and shortness of breath.   Cardiovascular: Negative for chest pain or palpitations.  Gastrointestinal: Negative for abdominal pain, no bowel changes.  Musculoskeletal: Negative for gait problem or joint swelling.  Skin: Negative for rash.  Neurological: Negative for dizziness or headache.  No other specific complaints in a complete review of systems (except as listed in HPI above).  Objective   Vitals:   01/15/19 1006  BP: 100/70  Pulse: 70  Resp: 16  Temp: 97.6 F (36.4 C)  TempSrc: Oral  SpO2: 96%  Weight: 224 lb (101.6 kg)  Height: 5' 3.5" (1.613 m)    Body mass index is 39.06 kg/m.  Physical Exam  Constitutional: Patient appears well-developed and obese  No distress.  HENT: Head: Normocephalic and atraumatic. Ears: B TMs ok, no erythema or effusion; Nose: Nose normal. Mouth/Throat: Oropharynx is clear and moist. No oropharyngeal exudate.  Eyes: Conjunctivae and EOM are normal. Pupils are equal, round, and reactive to light. No scleral icterus.  Neck: Normal range of motion. Neck supple. No JVD present. No thyromegaly present.  Cardiovascular: Normal rate, regular rhythm and normal heart sounds.  No murmur heard. No BLE edema. Pulmonary/Chest: Effort normal and breath sounds normal. No respiratory distress. Abdominal: Soft. Bowel sounds are normal, no distension. There is  no tenderness. no masses Breast: no lumps or masses, no nipple discharge or rashes FEMALE GENITALIA:  Not done RECTAL: not done Musculoskeletal: Normal range of motion, no joint effusions. Crepitus with extension of both knees Neurological: he is alert and oriented to person, place, and time. No cranial nerve deficit. Coordination, balance, strength, speech and gait are normal.  Skin: Skin is warm and dry. No rash noted. No erythema.  Psychiatric: Patient has a normal mood and affect. behavior is normal. Judgment and thought content normal.   PHQ2/9: Depression screen [facility] 2/9 01/15/2019 12/31/2018 11/07/2018 08/29/2018 08/19/2018  Decreased Interest _0 0  Down, Depressed, Hopeless 0 _1 0  PHQ - 2 Score _2 0  Altered sleeping 0 3 0 0 0  Tired, decreased energy _3 0  Change in appetite 0 _4 0  Feeling bad or failure about yourself  0 1 0 0 0  Trouble concentrating 1 2 0 1 0  Moving slowly or fidgety/restless 0 0 0 0 0  Suicidal thoughts 0 0 0 0 0  PHQ-9 Score _5 0  Difficult doing work/chores Somewhat difficult Somewhat difficult Somewhat difficult Very difficult Not difficult at all  Some recent data might be hidden     Fall Risk: Fall Risk  11/07/2018 08/19/2018 07/23/2018 07/09/2018 06/05/2018  Falls in the past year? _6 Yes  Number falls in past yr: 0 _7 Injury with Fall? 0 0 1 1 No  Comment - - - - -  Risk for fall due to : - - - History of fall(s) -     Functional Status Survey: Is the patient deaf or have difficulty hearing?: No Does the patient have difficulty seeing, even when wearing glasses/contacts?: No Does the patient have difficulty concentrating, remembering, or making decisions?: No Does the patient have difficulty walking or climbing stairs?: No Does the patient have difficulty dressing or bathing?: No Does the patient have difficulty doing errands alone such as visiting a doctor's office or shopping?: No  6CIT Screen  01/15/2019 01/04/2018  What Year? 0 points 0 points  What month? 0 points 0 points  What time? 0 points 0 points  Count back from 20 0 points 0 points  Months in reverse 4 points 0 points  Repeat phrase 4 points 4 points  Total Score 8 4    Assessment & Plan  1. Well woman exam  Failed 6CIT, we will check MMS in a few weeks with her regular follow up We will also check labs on her next visit   -USPSTF grade A and B recommendations reviewed with  patient; age-appropriate recommendations, preventive care, screening tests, etc discussed and encouraged; healthy living encouraged; see AVS for patient education given to patient -Discussed importance of 150 minutes of physical activity weekly, eat two servings of fish weekly, eat one serving of tree nuts ( cashews, pistachios, pecans, almonds.Marland Kitchen) every other day, eat 6 servings of fruit/vegetables daily and drink plenty of water and avoid sweet beverages.

## 2019-01-15 NOTE — Patient Instructions (Signed)
Try quinoa   Preventive Care 68 Years and Older, Female Preventive care refers to lifestyle choices and visits with your health care provider that can promote health and wellness. What does preventive care include?  A yearly physical exam. This is also called an annual well check.  Dental exams once or twice a year.  Routine eye exams. Ask your health care provider how often you should have your eyes checked.  Personal lifestyle choices, including: ? Daily care of your teeth and gums. ? Regular physical activity. ? Eating a healthy diet. ? Avoiding tobacco and drug use. ? Limiting alcohol use. ? Practicing safe sex. ? Taking low-dose aspirin every day. ? Taking vitamin and mineral supplements as recommended by your health care provider. What happens during an annual well check? The services and screenings done by your health care provider during your annual well check will depend on your age, overall health, lifestyle risk factors, and family history of disease. Counseling Your health care provider may ask you questions about your:  Alcohol use.  Tobacco use.  Drug use.  Emotional well-being.  Home and relationship well-being.  Sexual activity.  Eating habits.  History of falls.  Memory and ability to understand (cognition).  Work and work Statistician.  Reproductive health.  Screening You may have the following tests or measurements:  Height, weight, and BMI.  Blood pressure.  Lipid and cholesterol levels. These may be checked every 5 years, or more frequently if you are over 44 years old.  Skin check.  Lung cancer screening. You may have this screening every year starting at age 15 if you have a 30-pack-year history of smoking and currently smoke or have quit within the past 15 years.  Colorectal cancer screening. All adults should have this screening starting at age 58 and continuing until age 47. You will have tests every 1-10 years, depending on your  results and the type of screening test. People at increased risk should start screening at an earlier age. Screening tests may include: ? Guaiac-based fecal occult blood testing. ? Fecal immunochemical test (FIT). ? Stool DNA test. ? Virtual colonoscopy. ? Sigmoidoscopy. During this test, a flexible tube with a tiny camera (sigmoidoscope) is used to examine your rectum and lower colon. The sigmoidoscope is inserted through your anus into your rectum and lower colon. ? Colonoscopy. During this test, a long, thin, flexible tube with a tiny camera (colonoscope) is used to examine your entire colon and rectum.  Hepatitis C blood test.  Hepatitis B blood test.  Sexually transmitted disease (STD) testing.  Diabetes screening. This is done by checking your blood sugar (glucose) after you have not eaten for a while (fasting). You may have this done every 1-3 years.  Bone density scan. This is done to screen for osteoporosis. You may have this done starting at age 14.  Mammogram. This may be done every 1-2 years. Talk to your health care provider about how often you should have regular mammograms. Talk with your health care provider about your test results, treatment options, and if necessary, the need for more tests. Vaccines Your health care provider may recommend certain vaccines, such as:  Influenza vaccine. This is recommended every year.  Tetanus, diphtheria, and acellular pertussis (Tdap, Td) vaccine. You may need a Td booster every 10 years.  Varicella vaccine. You may need this if you have not been vaccinated.  Zoster vaccine. You may need this after age 32.  Measles, mumps, and rubella (MMR) vaccine. You  may need at least one dose of MMR if you were born in 1957 or later. You may also need a second dose.  Pneumococcal 13-valent conjugate (PCV13) vaccine. One dose is recommended after age 36.  Pneumococcal polysaccharide (PPSV23) vaccine. One dose is recommended after age 20.   Meningococcal vaccine. You may need this if you have certain conditions.  Hepatitis A vaccine. You may need this if you have certain conditions or if you travel or work in places where you may be exposed to hepatitis A.  Hepatitis B vaccine. You may need this if you have certain conditions or if you travel or work in places where you may be exposed to hepatitis B.  Haemophilus influenzae type b (Hib) vaccine. You may need this if you have certain conditions. Talk to your health care provider about which screenings and vaccines you need and how often you need them. This information is not intended to replace advice given to you by your health care provider. Make sure you discuss any questions you have with your health care provider. Document Released: 09/17/2015 Document Revised: 10/11/2017 Document Reviewed: 06/22/2015 Elsevier Interactive Patient Education  2019 [employer].

## 2019-02-05 ENCOUNTER — Ambulatory Visit: Payer: Medicare HMO | Admitting: Family Medicine

## 2019-02-06 ENCOUNTER — Other Ambulatory Visit: Payer: Self-pay

## 2019-02-06 ENCOUNTER — Encounter: Payer: Self-pay | Admitting: Family Medicine

## 2019-02-06 ENCOUNTER — Ambulatory Visit (INDEPENDENT_AMBULATORY_CARE_PROVIDER_SITE_OTHER): Payer: Medicare HMO | Admitting: Family Medicine

## 2019-02-06 VITALS — BP 92/60 | HR 95 | Temp 97.4°F | Resp 16 | Ht 63.5 in | Wt 219.9 lb

## 2019-02-06 DIAGNOSIS — I10 Essential (primary) hypertension: Secondary | ICD-10-CM

## 2019-02-06 DIAGNOSIS — J410 Simple chronic bronchitis: Secondary | ICD-10-CM

## 2019-02-06 DIAGNOSIS — I272 Pulmonary hypertension, unspecified: Secondary | ICD-10-CM | POA: Diagnosis not present

## 2019-02-06 DIAGNOSIS — R61 Generalized hyperhidrosis: Secondary | ICD-10-CM

## 2019-02-06 DIAGNOSIS — F330 Major depressive disorder, recurrent, mild: Secondary | ICD-10-CM | POA: Diagnosis not present

## 2019-02-06 DIAGNOSIS — G4733 Obstructive sleep apnea (adult) (pediatric): Secondary | ICD-10-CM | POA: Diagnosis not present

## 2019-02-06 DIAGNOSIS — E1169 Type 2 diabetes mellitus with other specified complication: Secondary | ICD-10-CM

## 2019-02-06 DIAGNOSIS — I739 Peripheral vascular disease, unspecified: Secondary | ICD-10-CM

## 2019-02-06 DIAGNOSIS — I209 Angina pectoris, unspecified: Secondary | ICD-10-CM | POA: Diagnosis not present

## 2019-02-06 DIAGNOSIS — E785 Hyperlipidemia, unspecified: Secondary | ICD-10-CM

## 2019-02-06 MED ORDER — CLONIDINE HCL 0.1 MG PO TABS: 0.1000 mg | ORAL_TABLET | Freq: Every evening | ORAL | 0 refills | Status: DC

## 2019-02-06 MED ORDER — ACCU-CHEK SMARTVIEW VI STRP: ORAL_STRIP | 6 refills | Status: AC

## 2019-02-06 NOTE — Progress Notes (Signed)
Name: Dana Sparks   MRN: [id number]    DOB: 1951/03/11   Date:02/06/2019       Progress Note  Subjective  Chief Complaint  Chief Complaint  Patient presents with  . Dyslipidemia  . Depression  . Post-Traumatic Stress Disorder    HPI  DMII with gastroparesis/CKI/neuropathy: she is taking medication as prescribed, seeing Dr. Gabriel Carina.Her hgbA1C has improved and last checked by Dr. Gabriel Carina was 7.8%. She had some medication changes on synjardi 12.01/999 two daily, glipizide is down to 5 mg still on NPH to 25 units am ad 30 units at night, her glucose at home has been reading high but they are changing meters since A1C has improved. She denies polyphagia, polyuria or polydipsia. She states she has following a healthier diet.   HTN: she denies dizziness, but bp is low today, she has been taking clonidine in am's, advised to change to pm and we will monitor for now, no chest pain or SOB at this time.She is compliant with medication,. Currently taking medication as prescribed    Angina: CAD, her cardiologist is Dr. Clayborn Bigness,  she denies any recent chest pain.She is also on statin therapy, aspirin , beta blocker and ace. She is on disability for it for many years, but now on social security.Still sees Dr. Clayborn Bigness, reviewed records and Echo from 2016 showed decreased in EF 25-30% and global hypokines.  OSA: she is using a CPAP machine most night, she needs to resume it every night. Shehas mild pulmonary hypertension. Unchanged   Major Depression Mild: she is doing well on Effexor again, she was off medication and had severe emotional liability, back on medication for a few weeks and denies crying spells phq 9 went up a little, phq9 has improved, continue medications  Morbid obesity:she lost another 5  lbs since last visit , she is trying to eat healthy, she is not going to the gym because of COVID-19 but has been active at home, gardening and mowing her grass  Hyperlipidemia:  taking statin therapy,reviewed labs visit  No side effects of medication Unchanged   Chronic bronchitis: spirometry today showed normal Fev1/FVC ration at 94%, she had a recent flare and used Breo with improvement of symptoms, she used to smoke 1 pack daily for 50 years, quit smoking in 2014 but still has daily morning cough that is occasionally productive that can be from clear to brown in color. Only occasionally has SOB. No wheezing. She still has symbicort at home, but got rx of Anoro, advised to finish symbicort and after that resume Anoro  Failed CIT but today normal MMS test   Patient Active Problem List   Diagnosis Date Noted  . Disease of stomach   . Hx of colonic polyps   . Hepatomegaly 07/23/2018  . Fatty liver 07/23/2018  . Aortic atherosclerosis ([place]) 07/09/2018  . Abnormal endocrine laboratory test finding 06/30/2018  . Type II or unspecified type diabetes mellitus with neurological manifestations, not stated as uncontrolled(250.60) 09/03/2017  . Nasal polyp 09/03/2017  . Lipoma of back 09/03/2017  . Claudication ([place]) 06/04/2017  . LVH (left ventricular hypertrophy) 02/27/2017  . Decreased cardiac ejection fraction 02/27/2017  . Left hand weakness 10/12/2016  . Elevated antinuclear antibody (ANA) level 04/27/2016  . Angina pectoris ([place]) 11/01/2015  . Well controlled type 2 diabetes mellitus with gastroparesis ([place]) 06/22/2015  . Low TSH level 06/22/2015  . Iron deficiency anemia 04/07/2015  . Intestinal metaplasia of gastric mucosa 03/11/2015  . CAD (coronary artery  disease), native coronary artery 02/24/2015  . History of coronary artery stent placement 02/24/2015  . Chronic kidney disease, stage 3, mod decreased GFR (HCC) 02/24/2015  . Menopause 02/24/2015  . History of Helicobacter pylori infection 02/24/2015  . Mild mitral insufficiency 02/24/2015  . Mild tricuspid insufficiency 02/24/2015  . 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  ([place]) 02/24/2015  . Controlled gout 02/24/2015  . Depression, major, recurrent, mild ([place]) 02/24/2015  . Morbid obesity due to excess calories ([place]) 02/24/2015  . Mild pulmonary hypertension ([place]) 02/24/2015  . Gastroesophageal reflux disease without esophagitis 02/24/2015  . Perennial allergic rhinitis 02/24/2015  . HLD (hyperlipidemia) 02/20/2015  . Hypertension, benign 02/20/2015  . Apnea, sleep 02/20/2015  . Controlled diabetes mellitus with stage 3 chronic kidney disease, without long-term current use of insulin ([place]) 02/20/2015    Past Surgical History:  Procedure Laterality Date  . ABDOMINAL HYSTERECTOMY     total  . CATARACT EXTRACTION    . COLONOSCOPY  01/2014   sigmoid diverticulosis. desc colon TA, hyperplastic polyp  . COLONOSCOPY WITH PROPOFOL N/A 09/02/2018   Procedure: COLONOSCOPY WITH PROPOFOL;  Surgeon: Lucilla Lame, MD;  Location: [facility];  Service: Endoscopy;  Laterality: N/A;  . CORONARY ANGIOPLASTY WITH STENT PLACEMENT    . ESOPHAGOGASTRODUODENOSCOPY N/A 02/16/2015   negative h pylori, focal gastic intestinal metaplasia  . ESOPHAGOGASTRODUODENOSCOPY (EGD) WITH PROPOFOL N/A 09/02/2018   Procedure: ESOPHAGOGASTRODUODENOSCOPY (EGD) WITH BIOPSIES;  Surgeon: Lucilla Lame, MD;  Location: [facility];  Service: Endoscopy;  Laterality: N/A;  diabetic -insulin and oral meds sleep apnea  . TOTAL KNEE ARTHROPLASTY Right     Family History  Problem Relation Age of Onset  . Stroke Sister   . Hyperlipidemia Sister   . Alcohol abuse Brother   . Diabetes Brother   . Stroke Brother   . Hypertension Mother   . Diabetes Mother   . Heart disease Mother   . Diabetes Father   . Heart disease Father   . Hypertension Father   . Hypertension Sister   . Cancer Maternal Grandmother        Unsure  . Diabetes Maternal Grandfather   . Colon cancer Neg Hx   . Liver disease Neg Hx   . Breast cancer Neg Hx     Social History   Socioeconomic History  .  Marital status: Married    Spouse name: Fritz Pickerel  . Number of children: 2  . Years of education: some college  . Highest education level: 12th grade  Occupational History  . Occupation: Disabled  Social Needs  . Financial resource strain: Hard  . Food insecurity:    Worry: Never true    Inability: Never true  . Transportation needs:    Medical: No    Non-medical: No  Tobacco Use  . Smoking status: Former Smoker    Packs/day: 1.00    Years: 40.00    Pack years: 40.00    Types: Cigarettes    Start date: 09/04/1972    Last attempt to quit: 12/30/2012    Years since quitting: 6.1  . Smokeless tobacco: Never Used  . Tobacco comment: smoking cessation materials not required  Substance and Sexual Activity  . Alcohol use: No    Alcohol/week: 0.0 standard drinks  . Drug use: No  . Sexual activity: Not Currently  Lifestyle  . Physical activity:    Days per week: 5 days    Minutes per session: 120 min  . Stress: Not  at all  Relationships  . Social connections:    Talks on phone: More than three times a week    Gets together: More than three times a week    Attends religious service: More than 4 times per year    Active member of club or organization: Yes    Attends meetings of clubs or organizations: More than 4 times per year    Relationship status: Married  . Intimate partner violence:    Fear of current or ex partner: No    Emotionally abused: No    Physically abused: No    Forced sexual activity: No  Other Topics Concern  . Not on file  Social History Narrative   Married - current 48rd,    Two children from first marriage   He has 4 children from previous marriage     Current Outpatient Medications:  .  ACCU-CHEK FASTCLIX LANCETS MISC, , Disp: , Rfl:  .  ACCU-CHEK SMARTVIEW test strip, Check fsbs two times daily  DMII, Disp: 100 each, Rfl: 6 .  Alcohol Swabs (B-D SINGLE USE SWABS REGULAR) PADS, 1 each by Does not apply route 4 (four) times daily., Disp: 200 each, Rfl:  2 .  allopurinol (ZYLOPRIM) 100 MG tablet, , Disp: , Rfl:  .  aspirin EC 81 MG tablet, Take 81 mg by mouth every morning. , Disp: , Rfl:  .  BD INSULIN SYRINGE U/F 31G X 5/16" 0.3 ML MISC, , Disp: , Rfl:  .  blood glucose meter kit and supplies KIT, Dispense based on patient and insurance preference. Use up to four times daily as directed. (FOR ICD-9 250.00, 250.01)., Disp: 1 each, Rfl: 0 .  Cholecalciferol (VITAMIN D-3 PO), Take 1,000 Units by mouth 2 (two) times a day., Disp: , Rfl:  .  cloNIDine (CATAPRES) 0.1 MG tablet, Take 1 tablet (0.1 mg total) by mouth 2 (two) times daily as needed. Start taking 1 tablet at bedtime for sweating, may increase to twice daily as tolerated., Disp: 180 tablet, Rfl: 0 .  Coenzyme Q10 100 MG capsule, TAKE 1 CAPSULE EVERY DAY, Disp: 90 capsule, Rfl: 1 .  cyclobenzaprine (FLEXERIL) 5 MG tablet, Take 1 tablet (5 mg total) by mouth 3 (three) times daily as needed for muscle spasms., Disp: 8 tablet, Rfl: 0 .  Ferrous Sulfate (IRON) 325 (65 Fe) MG TABS, Take 325 mg by mouth 2 (two) times daily. Nature made brand-  Pt takes one tablet twice a day , Disp: , Rfl:  .  fluticasone (FLONASE) 50 MCG/ACT nasal spray, Place 2 sprays into both nostrils daily., Disp: 48 g, Rfl: 2 .  glipiZIDE (GLUCOTROL XL) 5 MG 24 hr tablet, Take 1 tablet by mouth daily., Disp: , Rfl:  .  insulin NPH Human (HUMULIN N,NOVOLIN N) 100 UNIT/ML injection, Inject 25-30 Units into the skin 2 (two) times daily before a meal. 25 units in the morning and 30 units at bedtime, Disp: , Rfl:  .  ipratropium (ATROVENT) 0.03 % nasal spray, Place 2 sprays into both nostrils every 12 (twelve) hours., Disp: 1 mL, Rfl: 0 .  isosorbide mononitrate (IMDUR) 60 MG 24 hr tablet, Take 1 tablet (60 mg total) by mouth daily., Disp: 90 tablet, Rfl: 1 .  lisinopril (PRINIVIL,ZESTRIL) 20 MG tablet, Take 1 tablet (20 mg total) by mouth daily., Disp: 90 tablet, Rfl: 1 .  meloxicam (MOBIC) 7.5 MG tablet, Take by mouth., Disp: ,  Rfl:  .  metoprolol succinate (TOPROL-XL) 50 MG 24  hr tablet, Take 1 tablet (50 mg total) by mouth daily., Disp: 90 tablet, Rfl: 3 .  montelukast (SINGULAIR) 10 MG tablet, Take 1 tablet (10 mg total) by mouth every evening., Disp: 90 tablet, Rfl: 1 .  pantoprazole (PROTONIX) 40 MG tablet, Take 1 tablet (40 mg total) by mouth every morning., Disp: 90 tablet, Rfl: 1 .  rosuvastatin (CRESTOR) 20 MG tablet, Take 1 tablet (20 mg total) by mouth daily., Disp: 90 tablet, Rfl: 1 .  SYNJARDY XR 12.01-999 MG TB24, Take 2 tablets by mouth daily., Disp: , Rfl:  .  umeclidinium-vilanterol (ANORO ELLIPTA) 62.5-25 MCG/INH AEPB, Inhale 1 puff into the lungs daily., Disp: 270 each, Rfl: 1 .  venlafaxine XR (EFFEXOR-XR) 75 MG 24 hr capsule, Take one tablet a day., Disp: 90 capsule, Rfl: 1 .  loratadine (CLARITIN) 10 MG tablet, Take 1 tablet (10 mg total) by mouth daily. (Patient not taking: Reported on 02/06/2019), Disp: 30 tablet, Rfl: 3  Allergies  Allergen Reactions  . Codeine Other (See Comments)    Unknown reaction  . Contrast Media [Iodinated Diagnostic Agents] Itching  . Sulfa Antibiotics Itching    I personally reviewed active problem list, medication list, allergies, family history, social history with the patient/caregiver today.   ROS  Constitutional: Negative for fever or weight change.  Respiratory: Negative for cough and shortness of breath.   Cardiovascular: Negative for chest pain or palpitations.  Gastrointestinal: Negative for abdominal pain, no bowel changes.  Musculoskeletal: Negative for gait problem or joint swelling.  Skin: Negative for rash.  Neurological: Negative for dizziness or headache.  No other specific complaints in a complete review of systems (except as listed in HPI above).   Objective  Vitals:   02/06/19 0900  BP: 92/60  Pulse: 95  Resp: 16  Temp: (!) 97.4 F (36.3 C)  TempSrc: Oral  SpO2: 95%  Weight: 219 lb 14.4 oz (99.7 kg)  Height: 5' 3.5" (1.613 m)     Body mass index is 38.34 kg/m.  Physical Exam  Constitutional: Patient appears well-developed and well-nourished. Obese  No distress.  HEENT: head atraumatic, normocephalic, pupils equal and reactive to light,  neck supple, wearing a surgical mask  Cardiovascular: Normal rate, regular rhythm and normal heart sounds.  No murmur heard. No BLE edema. Pulmonary/Chest: Effort normal and breath sounds normal. No respiratory distress. Abdominal: Soft.  There is no tenderness. Psychiatric: Patient has a normal mood and affect. behavior is normal. Judgment and thought content normal.  PHQ2/9: Depression screen [facility] 2/9 01/15/2019 12/31/2018 11/07/2018 08/29/2018 08/19/2018  Decreased Interest '1 2 2 2 ' 0  Down, Depressed, Hopeless 0 '2 1 1 ' 0  PHQ - 2 Score '1 4 3 3 ' 0  Altered sleeping 0 3 0 0 0  Tired, decreased energy '1 2 1 1 ' 0  Change in appetite 0 '3 3 1 ' 0  Feeling bad or failure about yourself  0 1 0 0 0  Trouble concentrating 1 2 0 1 0  Moving slowly or fidgety/restless 0 0 0 0 0  Suicidal thoughts 0 0 0 0 0  PHQ-9 Score '3 15 7 6 ' 0  Difficult doing work/chores Somewhat difficult Somewhat difficult Somewhat difficult Very difficult Not difficult at all  Some recent data might be hidden    phq 9 is positive   Fall Risk: Fall Risk  02/06/2019 11/07/2018 08/19/2018 07/23/2018 07/09/2018  Falls in the past year? 0 '1 1 1 1  ' Number falls in past yr: 0 0 1  1 1  Injury with Fall? 0 0 0 1 1  Comment - - - - -  Risk for fall due to : - - - - History of fall(s)      Functional Status Survey: Is the patient deaf or have difficulty hearing?: No Does the patient have difficulty seeing, even when wearing glasses/contacts?: No Does the patient have difficulty concentrating, remembering, or making decisions?: No Does the patient have difficulty walking or climbing stairs?: No Does the patient have difficulty dressing or bathing?: No Does the patient have difficulty doing errands alone such as  visiting a doctor's office or shopping?: No    Assessment & Plan  1. Dyslipidemia associated with type 2 diabetes mellitus (HCC)  - ACCU-CHEK SMARTVIEW test strip; Check fsbs two times daily  DMII  Dispense: 100 each; Refill: 6  2. Hypertension, benign  Low, change time of taking clonidine   3. Excessive sweating  - cloNIDine (CATAPRES) 0.1 MG tablet; Take 1 tablet (0.1 mg total) by mouth every evening. Start taking 1 tablet at bedtime for sweating, may increase to twice daily as tolerated.  Dispense: 90 tablet; Refill: 0  4. Morbid obesity due to excess calories [facility])  Discussed with the patient the risk posed by an increased BMI. Discussed importance of portion control, calorie counting and at least 150 minutes of physical activity weekly. Avoid sweet beverages and drink more water. Eat at least 6 servings of fruit and vegetables daily   5. Claudication ([place])  Stable   6. Obstructive sleep apnea syndrome   7. Simple chronic bronchitis (HCC)  Switch to Anoro when out of Symbicort referral to chronic care management to see if she qualifies for assistance with anoro  8. Angina pectoris ([place])  Doing well at this time  9. Mild pulmonary hypertension ([place])   10. Depression, major, recurrent, mild ([place])  Doing well

## 2019-02-07 ENCOUNTER — Ambulatory Visit: Payer: Self-pay | Admitting: Pharmacist

## 2019-02-07 NOTE — Chronic Care Management (AMB) (Signed)
  Chronic Care Management   Note  02/07/2019 Name: Dana Sparks MRN: [id number] DOB: 1951-03-06  68 y.o. year old female referred to Chronic Care Management by Dr. Steele Sizer for medication assistance. Chronic conditions include T2DM, COPD. Last office visit with Steele Sizer, MD was 02/06/19.  Was unable to reach patient via telephone today and have left HIPAA compliant voicemail asking patient to return my call. (unsuccessful outreach #1).  Follow up plan: A HIPPA compliant phone message was left for the patient providing contact information and requesting a return call.  The care management team will reach out to the patient again over the next 7 days.   [name], PharmD Clinical Pharmacist [facility] [phone]

## 2019-02-11 ENCOUNTER — Telehealth: Payer: Self-pay

## 2019-02-11 ENCOUNTER — Telehealth: Payer: Self-pay | Admitting: Nurse Practitioner

## 2019-02-11 NOTE — Telephone Encounter (Signed)
Please see if she can drop of POC urinalysis sample and do virtual visit from car to reduce contact.

## 2019-02-11 NOTE — Telephone Encounter (Signed)
Copied from [place] [phone]. Topic: General - Other >> Feb 11, 2019 10:12 AM Yvette Rack wrote: Reason for CRM: Pt stated she needs a urine test. Pt requests a call back. Cb# [phone].  Spoke with patient she is experiencing some irritation after she urinates and wipes with tissue. If she wipes with a cloth it isn't that bad. Please advise.

## 2019-02-11 NOTE — Telephone Encounter (Signed)
Please advise as to where to schedule. There are no availabilities the rest of the week .

## 2019-02-11 NOTE — Telephone Encounter (Signed)
There is a 40 min HFU there, is that still ok?

## 2019-02-11 NOTE — Telephone Encounter (Signed)
Yes- we can double book to fit her in

## 2019-02-11 NOTE — Telephone Encounter (Signed)
Patient needs appointment.

## 2019-02-11 NOTE — Telephone Encounter (Signed)
I can see her tomorrow at 11:40

## 2019-02-12 ENCOUNTER — Encounter: Payer: Self-pay | Admitting: Nurse Practitioner

## 2019-02-12 ENCOUNTER — Other Ambulatory Visit: Payer: Self-pay

## 2019-02-12 ENCOUNTER — Ambulatory Visit (INDEPENDENT_AMBULATORY_CARE_PROVIDER_SITE_OTHER): Payer: Medicare HMO | Admitting: Nurse Practitioner

## 2019-02-12 ENCOUNTER — Other Ambulatory Visit (HOSPITAL_COMMUNITY)
Admission: RE | Admit: 2019-02-12 | Discharge: 2019-02-12 | Disposition: A | Discharge status: Home / Self Care | Payer: Medicare HMO | Source: Ambulatory Visit | Attending: Nurse Practitioner | Admitting: Nurse Practitioner

## 2019-02-12 VITALS — BP 118/64 | HR 81 | Temp 97.8°F | Resp 14 | Ht 65.5 in | Wt 220.7 lb

## 2019-02-12 DIAGNOSIS — N898 Other specified noninflammatory disorders of vagina: Secondary | ICD-10-CM | POA: Diagnosis not present

## 2019-02-12 DIAGNOSIS — R319 Hematuria, unspecified: Secondary | ICD-10-CM

## 2019-02-12 DIAGNOSIS — L309 Dermatitis, unspecified: Secondary | ICD-10-CM | POA: Diagnosis not present

## 2019-02-12 DIAGNOSIS — R102 Pelvic and perineal pain: Secondary | ICD-10-CM | POA: Diagnosis not present

## 2019-02-12 DIAGNOSIS — R309 Painful micturition, unspecified: Secondary | ICD-10-CM | POA: Diagnosis not present

## 2019-02-12 LAB — URINALYSIS, ROUTINE W REFLEX MICROSCOPIC
Bilirubin Urine: NEGATIVE
Hgb urine dipstick: NEGATIVE
Ketones, ur: NEGATIVE
Leukocytes,Ua: NEGATIVE
Nitrite: NEGATIVE
Protein, ur: NEGATIVE
Specific Gravity, Urine: 1.028 (ref 1.001–1.03)
pH: <=5 (ref 5.0–8.0)

## 2019-02-12 LAB — POCT URINALYSIS DIPSTICK
Appearance: NEGATIVE
Bilirubin, UA: NEGATIVE
Glucose, UA: POSITIVE — AB
Ketones, UA: NEGATIVE
Leukocytes, UA: NEGATIVE
Nitrite, UA: NEGATIVE
Protein, UA: NEGATIVE
Spec Grav, UA: 1.025 (ref 1.010–1.025)
Urobilinogen, UA: NEGATIVE E.U./dL — AB
pH, UA: 5 (ref 5.0–8.0)

## 2019-02-12 MED ORDER — TRIAMCINOLONE ACETONIDE 0.1 % EX CREA: 1.0000 "application " | TOPICAL_CREAM | Freq: Two times a day (BID) | CUTANEOUS | 0 refills | Status: DC

## 2019-02-12 NOTE — Patient Instructions (Addendum)
For overactive bladder: reduce consumption of alcoholic, caffeinated, and carbonated beverages, consume liquids (mostly water) in small amounts of throughout the day, rather than drinking large volumes at one time. If you have to wake up often during the night to pee we recommend you to decrease or eliminate liquid consumed after dinner Kegel exercises:  three sets of 8 to 12 contractions sustained for 8 to 10 seconds each, performed three times a day. Try to do this every day and continue for at least 15 to 20   If your symptoms persist over the month or worsen at anytime let us know and we can consider other options.   Your Care Instructions Vulvar dermatitis happens when the soft folds of skin around the opening of the vagina become red, painful, and itchy. Dermatitis can be caused by heat or wetness or can be a reaction to scented soaps, powders, creams, toilet paper, spermicides, or clothing. A skin condition, such as eczema, also can cause dermatitis. Your doctor may do tests to find out what is causing your symptoms. You can treat symptoms of vulvar dermatitis with medicine and home treatment. Try not to scratch your rash. Scratching can make the rash last longer or get worse.  Follow-up care is a key part of your treatment and safety. Be sure to make and go to all appointments, and call your doctor or nurse call line if you are having problems. It's also a good idea to know your test results and keep a list of the medicines you take.  How can you care for yourself at home?  Use your medicine exactly as prescribed. Call your doctor or nurse call line if you think you are having a problem with your medicine. Tell your doctor if you are taking other prescription or over-the-counter medicines.  Wash your vaginal area no more than once a day. Use cool water with or without mild soap. Pat dry or use a hair dryer on a low setting.  Do not have sex until you feel better.  Do not douche or use  powders or sprays on your vulvar area.  Try not to scratch. Use a cold pack or a cool bath to treat itching.  For severe itching, try an oral antihistamine, such as a non-drowsy one like loratadine (Claritin) one that might make you sleepy, like diphenhydramine (Benadryl). Read and follow all instructions on the label.  Try sleeping without underwear.  Wear loose cotton clothing. Do not wear nylon or other materials that hold body heat and moisture close to the skin.   When should you call for help?  Call your doctor or nurse call line now or seek immediate medical care if:  You have a fever.  You have vaginal discharge that smells bad.  You have burning or pain when you urinate.  You have increased pain, swelling, warmth, or redness in the vaginal area.

## 2019-02-12 NOTE — Progress Notes (Signed)
Name: Dana Sparks   MRN: [id number]    DOB: Sep 18, 1950   Date:02/12/2019       Progress Note  Subjective  Chief Complaint  Chief Complaint  Patient presents with  . Urinary Tract Infection    onset 2-3 weeks ago, pain and pressure    HPI  Patient endorses pain when urinating if urine touches skin. States she has to wipe really good. Has been using A&D cream. Endorses mild clear vaginal discharge. States her whole vagina feels sore. She has not been sexually active for over the past 10 years. No recent antibiotic use. No douching. Endorses urinary frequency and some dribbling. Drinks a lot of water and maybe 2 cups of coffee a day.   PHQ2/9: Depression screen [facility] 2/9 02/12/2019 02/06/2019 01/15/2019 12/31/2018 11/07/2018  Decreased Interest 0 _0 Down, Depressed, Hopeless 0 0 0 2 1  PHQ - 2 Score 0 _1 Altered sleeping 0 0 0 3 0  Tired, decreased energy 0 _2 Change in appetite 0 0 0 3 3  Feeling bad or failure about yourself  0 0 0 1 0  Trouble concentrating 0 0 1 2 0  Moving slowly or fidgety/restless 0 0 0 0 0  Suicidal thoughts 0 0 0 0 0  PHQ-9 Score 0 _3 Difficult doing work/chores Not difficult at all Not difficult at all Somewhat difficult Somewhat difficult Somewhat difficult  Some recent data might be hidden     PHQ reviewed. Negative  Patient Active Problem List   Diagnosis Date Noted  . Disease of stomach   . Hx of colonic polyps   . Hepatomegaly 07/23/2018  . Fatty liver 07/23/2018  . Aortic atherosclerosis ([place]) 07/09/2018  . Abnormal endocrine laboratory test finding 06/30/2018  . Type II or unspecified type diabetes mellitus with neurological manifestations, not stated as uncontrolled(250.60) 09/03/2017  . Nasal polyp 09/03/2017  . Lipoma of back 09/03/2017  . Claudication ([place]) 06/04/2017  . LVH (left ventricular hypertrophy) 02/27/2017  . Decreased cardiac ejection fraction 02/27/2017  . Left hand weakness 10/12/2016  . Elevated  antinuclear antibody (ANA) level 04/27/2016  . Angina pectoris ([place]) 11/01/2015  . Well controlled type 2 diabetes mellitus with gastroparesis ([place]) 06/22/2015  . Low TSH level 06/22/2015  . Iron deficiency anemia 04/07/2015  . Intestinal metaplasia of gastric mucosa 03/11/2015  . CAD (coronary artery disease), native coronary artery 02/24/2015  . History of coronary artery stent placement 02/24/2015  . Chronic kidney disease, stage 3, mod decreased GFR (HCC) 02/24/2015  . Menopause 02/24/2015  . History of Helicobacter pylori infection 02/24/2015  . Mild mitral insufficiency 02/24/2015  . Mild tricuspid insufficiency 02/24/2015  . 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 ([place]) 02/24/2015  . Controlled gout 02/24/2015  . Depression, major, recurrent, mild ([place]) 02/24/2015  . Morbid obesity due to excess calories ([place]) 02/24/2015  . Mild pulmonary hypertension ([place]) 02/24/2015  . Gastroesophageal reflux disease without esophagitis 02/24/2015  . Perennial allergic rhinitis 02/24/2015  . HLD (hyperlipidemia) 02/20/2015  . Hypertension, benign 02/20/2015  . Apnea, sleep 02/20/2015  . Controlled diabetes mellitus with stage 3 chronic kidney disease, without long-term current use of insulin ([place]) 02/20/2015    Past Medical History:  Diagnosis Date  . Allergy   . Anxiety   . Arthritis    right knee  . CAD (coronary artery disease)   . COPD (chronic obstructive pulmonary disease) (  [place])   . Depression   . Diabetes mellitus without complication ([place])    type 2  . Diverticulitis   . Gastroparesis   . GERD (gastroesophageal reflux disease)   . Gout   . Hyperlipemia   . Hypertension   . Positive H. pylori test   . Renal insufficiency   . Sleep apnea    CPAP  . Thrombocytosis ([place]) 01/28/2015  . Tubular adenoma of colon 01/20/14    Past Surgical History:  Procedure Laterality Date  . ABDOMINAL HYSTERECTOMY     total  . CATARACT EXTRACTION    .  COLONOSCOPY  01/2014   sigmoid diverticulosis. desc colon TA, hyperplastic polyp  . COLONOSCOPY WITH PROPOFOL N/A 09/02/2018   Procedure: COLONOSCOPY WITH PROPOFOL;  Surgeon: Lucilla Lame, MD;  Location: [facility];  Service: Endoscopy;  Laterality: N/A;  . CORONARY ANGIOPLASTY WITH STENT PLACEMENT    . ESOPHAGOGASTRODUODENOSCOPY N/A 02/16/2015   negative h pylori, focal gastic intestinal metaplasia  . ESOPHAGOGASTRODUODENOSCOPY (EGD) WITH PROPOFOL N/A 09/02/2018   Procedure: ESOPHAGOGASTRODUODENOSCOPY (EGD) WITH BIOPSIES;  Surgeon: Lucilla Lame, MD;  Location: [facility];  Service: Endoscopy;  Laterality: N/A;  diabetic -insulin and oral meds sleep apnea  . TOTAL KNEE ARTHROPLASTY Right     Social History   Tobacco Use  . Smoking status: Former Smoker    Packs/day: 1.00    Years: 40.00    Pack years: 40.00    Types: Cigarettes    Start date: 09/04/1972    Last attempt to quit: 12/30/2012    Years since quitting: 6.1  . Smokeless tobacco: Never Used  . Tobacco comment: smoking cessation materials not required  Substance Use Topics  . Alcohol use: No    Alcohol/week: 0.0 standard drinks     Current Outpatient Medications:  .  allopurinol (ZYLOPRIM) 100 MG tablet, , Disp: , Rfl:  .  aspirin EC 81 MG tablet, Take 81 mg by mouth every morning. , Disp: , Rfl:  .  Cholecalciferol (VITAMIN D-3 PO), Take 1,000 Units by mouth 2 (two) times a day., Disp: , Rfl:  .  cloNIDine (CATAPRES) 0.1 MG tablet, Take 1 tablet (0.1 mg total) by mouth every evening. Start taking 1 tablet at bedtime for sweating, may increase to twice daily as tolerated., Disp: 90 tablet, Rfl: 0 .  Coenzyme Q10 100 MG capsule, TAKE 1 CAPSULE EVERY DAY, Disp: 90 capsule, Rfl: 1 .  cyclobenzaprine (FLEXERIL) 5 MG tablet, Take 1 tablet (5 mg total) by mouth 3 (three) times daily as needed for muscle spasms., Disp: 8 tablet, Rfl: 0 .  Ferrous Sulfate (IRON) 325 (65 Fe) MG TABS, Take 325 mg by mouth 2 (two)  times daily. [employer] made brand-  Pt takes one tablet twice a day , Disp: , Rfl:  .  fluticasone (FLONASE) 50 MCG/ACT nasal spray, Place 2 sprays into both nostrils daily., Disp: 48 g, Rfl: 2 .  glipiZIDE (GLUCOTROL XL) 5 MG 24 hr tablet, Take 1 tablet by mouth daily., Disp: , Rfl:  .  insulin NPH Human (HUMULIN N,NOVOLIN N) 100 UNIT/ML injection, Inject 25-30 Units into the skin 2 (two) times daily before a meal. 25 units in the morning and 30 units at bedtime, Disp: , Rfl:  .  ipratropium (ATROVENT) 0.03 % nasal spray, Place 2 sprays into both nostrils every 12 (twelve) hours., Disp: 1 mL, Rfl: 0 .  isosorbide mononitrate (IMDUR) 60 MG 24 hr tablet, Take 1 tablet (60 mg total) by mouth  daily., Disp: 90 tablet, Rfl: 1 .  lisinopril (PRINIVIL,ZESTRIL) 20 MG tablet, Take 1 tablet (20 mg total) by mouth daily., Disp: 90 tablet, Rfl: 1 .  loratadine (CLARITIN) 10 MG tablet, Take 1 tablet (10 mg total) by mouth daily., Disp: 30 tablet, Rfl: 3 .  meloxicam (MOBIC) 7.5 MG tablet, Take by mouth., Disp: , Rfl:  .  metoprolol succinate (TOPROL-XL) 50 MG 24 hr tablet, Take 1 tablet (50 mg total) by mouth daily., Disp: 90 tablet, Rfl: 3 .  montelukast (SINGULAIR) 10 MG tablet, Take 1 tablet (10 mg total) by mouth every evening., Disp: 90 tablet, Rfl: 1 .  pantoprazole (PROTONIX) 40 MG tablet, Take 1 tablet (40 mg total) by mouth every morning., Disp: 90 tablet, Rfl: 1 .  rosuvastatin (CRESTOR) 20 MG tablet, Take 1 tablet (20 mg total) by mouth daily., Disp: 90 tablet, Rfl: 1 .  SYNJARDY XR 12.01-999 MG TB24, Take 2 tablets by mouth daily., Disp: , Rfl:  .  umeclidinium-vilanterol (ANORO ELLIPTA) 62.5-25 MCG/INH AEPB, Inhale 1 puff into the lungs daily., Disp: 270 each, Rfl: 1 .  venlafaxine XR (EFFEXOR-XR) 75 MG 24 hr capsule, Take one tablet a day., Disp: 90 capsule, Rfl: 1 .  ACCU-CHEK FASTCLIX LANCETS MISC, , Disp: , Rfl:  .  ACCU-CHEK SMARTVIEW test strip, Check fsbs two times daily  DMII, Disp: 100 each,  Rfl: 6 .  Alcohol Swabs (B-D SINGLE USE SWABS REGULAR) PADS, 1 each by Does not apply route 4 (four) times daily., Disp: 200 each, Rfl: 2 .  BD INSULIN SYRINGE U/F 31G X 5/16" 0.3 ML MISC, , Disp: , Rfl:  .  blood glucose meter kit and supplies KIT, Dispense based on patient and insurance preference. Use up to four times daily as directed. (FOR ICD-9 250.00, 250.01)., Disp: 1 each, Rfl: 0  Allergies  Allergen Reactions  . Codeine Other (See Comments)    Unknown reaction  . Contrast Media [Iodinated Diagnostic Agents] Itching  . Sulfa Antibiotics Itching    ROS   No other specific complaints in a complete review of systems (except as listed in HPI above).  Objective  Vitals:   02/12/19 1125  BP: 118/64  Pulse: 81  Resp: 14  Temp: 97.8 F (36.6 C)  TempSrc: Oral  SpO2: 95%  Weight: 220 lb 11.2 oz (100.1 kg)  Height: 5' 5.5" (1.664 m)    Body mass index is 36.17 kg/m.  Nursing Note and Vital Signs reviewed.  Physical Exam Exam conducted with a chaperone present.  Constitutional:      Appearance: Normal appearance.  HENT:     Head: Normocephalic and atraumatic.  Cardiovascular:     Rate and Rhythm: Normal rate.  Pulmonary:     Effort: Pulmonary effort is normal.  Abdominal:     General: Bowel sounds are normal.     Tenderness: There is no abdominal tenderness.  Genitourinary:    Pubic Area: No rash or pubic lice.      Labia:        Right: Tenderness present.        Left: Tenderness present.      Urethra: Prolapse present.    Skin:    General: Skin is warm and dry.  Neurological:     General: No focal deficit present.     Mental Status: She is alert and oriented to person, place, and time.  Psychiatric:        Mood and Affect: Mood normal.  Behavior: Behavior normal.        Results for orders placed or performed in visit on 02/12/19 (from the past 48 hour(s))  POCT urinalysis dipstick     Status: Abnormal   Collection Time: 02/12/19 11:32 AM   Result Value Ref Range   Color, UA Yellow    Clarity, UA clear    Glucose, UA Positive (A) Negative   Bilirubin, UA Negative    Ketones, UA Negative    Spec Grav, UA 1.025 1.010 - 1.025   Blood, UA Trace    pH, UA 5.0 5.0 - 8.0   Protein, UA Negative Negative   Urobilinogen, UA negative (A) 0.2 or 1.0 E.U./dL   Nitrite, UA Negative    Leukocytes, UA Negative Negative   Appearance Negative    Odor None     Assessment & Plan  1. Painful urination - POCT urinalysis dipstick - Urinalysis, Routine w reflex microscopic  2. Pelvic pressure in female - POCT urinalysis dipstick  3. Hematuria, unspecified type - Urinalysis, Routine w reflex microscopic  4. Vulvar dermatitis - triamcinolone cream (KENALOG) 0.1 %; Apply 1 application topically 2 (two) times daily.  Dispense: 30 g; Refill: 0 - Cervicovaginal ancillary only  5. Vaginal discharge - Cervicovaginal ancillary only

## 2019-02-12 NOTE — Telephone Encounter (Signed)
Where would you  Like for [place] to schedule her at?

## 2019-02-13 ENCOUNTER — Other Ambulatory Visit: Payer: Self-pay | Admitting: Family Medicine

## 2019-02-13 ENCOUNTER — Telehealth: Payer: Self-pay | Admitting: Family Medicine

## 2019-02-13 DIAGNOSIS — B373 Candidiasis of vulva and vagina: Secondary | ICD-10-CM

## 2019-02-13 DIAGNOSIS — B3731 Acute candidiasis of vulva and vagina: Secondary | ICD-10-CM

## 2019-02-13 LAB — CERVICOVAGINAL ANCILLARY ONLY
Bacterial vaginitis: NEGATIVE
Candida vaginitis: POSITIVE — AB
Trichomonas: NEGATIVE

## 2019-02-13 MED ORDER — FLUCONAZOLE 150 MG PO TABS: 150.0000 mg | ORAL_TABLET | Freq: Once | ORAL | 0 refills | Status: DC

## 2019-02-13 MED ORDER — FLUCONAZOLE 150 MG PO TABS: 150.0000 mg | ORAL_TABLET | Freq: Once | ORAL | 0 refills | Status: AC

## 2019-02-13 NOTE — Chronic Care Management (AMB) (Signed)
Chronic Care Management  ° °Note ° °02/13/2019 °Name: Dana Sparks MRN: [id number] DOB: 06/22/1951 ° °Dana Sparks is a 67 y.o. year old female who is a primary care patient of Sowles, Krichna, MD. I reached out to Dana Sparks by phone today in response to a referral sent by [name]'s health plan.   ° °Dana Sparks was given information about Chronic Care Management services today including:  °1. CCM service includes personalized support from designated clinical staff supervised by her physician, including individualized plan of care and coordination with other care providers °2. 24/7 contact phone numbers for assistance for urgent and routine care needs. °3. Service will only be billed when office clinical staff spend 20 minutes or more in a month to coordinate care. °4. Only one practitioner may furnish and bill the service in a calendar month. °5. The patient may stop CCM services at any time (effective at the end of the month) by phone call to the office staff. °6. The patient will be responsible for cost sharing (co-pay) of up to 20% of the service fee (after annual deductible is met). ° °Patient agreed to services and verbal consent obtained.  ° °Follow up plan: °Telephone appointment with CCM team member scheduled for: 02/19/2019 ° °Dana Sparks °Care Guide • [facility] °[place]   [facility]  °??[email or web address]   ??[phone]   ° ° ° °

## 2019-02-19 ENCOUNTER — Ambulatory Visit (INDEPENDENT_AMBULATORY_CARE_PROVIDER_SITE_OTHER): Payer: Medicare HMO | Admitting: Pharmacist

## 2019-02-19 DIAGNOSIS — E1143 Type 2 diabetes mellitus with diabetic autonomic (poly)neuropathy: Secondary | ICD-10-CM | POA: Diagnosis not present

## 2019-02-19 DIAGNOSIS — F330 Major depressive disorder, recurrent, mild: Secondary | ICD-10-CM
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
## 2019-02-24 ENCOUNTER — Other Ambulatory Visit: Payer: Self-pay | Admitting: Family Medicine

## 2019-02-24 DIAGNOSIS — R61 Generalized hyperhidrosis: Secondary | ICD-10-CM

## 2019-02-24 MED ORDER — CLONIDINE HCL 0.1 MG PO TABS: 0.1000 mg | ORAL_TABLET | Freq: Every evening | ORAL | 0 refills | Status: DC

## 2019-02-24 NOTE — Telephone Encounter (Signed)
Can you please re send

## 2019-02-24 NOTE — Telephone Encounter (Signed)
Medication Refill - Medication: cloNIDine (CATAPRES) 0.1 MG tablet   Has the patient contacted their pharmacy? Yes.   (Agent: If no, request that the patient contact the pharmacy for the refill.) (Agent: If yes, when and what did the pharmacy advise?)  Preferred Pharmacy (with phone number or street name): [place]  Agent: Please be advised that RX refills may take up to 3 business days. We ask that you follow-up with your pharmacy.

## 2019-02-28 ENCOUNTER — Telehealth: Payer: Self-pay

## 2019-02-28 NOTE — Telephone Encounter (Signed)
Call pt regarding lung screening. Pt is a former smoker. Pt would like early morning for scan on any day. Pt denies any new health issue.

## 2019-03-03 ENCOUNTER — Telehealth: Payer: Self-pay | Admitting: *Deleted

## 2019-03-03 DIAGNOSIS — Z122 Encounter for screening for malignant neoplasm of respiratory organs: Secondary | ICD-10-CM

## 2019-03-03 DIAGNOSIS — Z87891 Personal history of nicotine dependence: Secondary | ICD-10-CM

## 2019-03-03 NOTE — Telephone Encounter (Signed)
Patient has been notified that annual lung cancer screening low dose CT scan is due currently or will be in near future. Confirmed that patient is within the age range of 55-77, and asymptomatic, (no signs or symptoms of lung cancer). Patient denies illness that would prevent curative treatment for lung cancer if found. Verified smoking history, (former, quit 2014, 40 pack year). The shared decision making visit was done 02/13/18. Patient is agreeable for CT scan being scheduled.

## 2019-03-05 ENCOUNTER — Ambulatory Visit: Payer: Self-pay | Admitting: Pharmacist

## 2019-03-05 ENCOUNTER — Telehealth: Payer: Self-pay

## 2019-03-05 DIAGNOSIS — E1143 Type 2 diabetes mellitus with diabetic autonomic (poly)neuropathy: Secondary | ICD-10-CM

## 2019-03-05 NOTE — Chronic Care Management (AMB) (Signed)
  Chronic Care Management   Note  03/05/2019 Name: Dana Sparks MRN: [id number] DOB: Jun 04, 1951  Successful outreach to [place] today to follow up on medication assistance applications. HIPAA identifiers verified.  Ms. Horwitz states that she is not feeling well at all and declines to talk. Her foot is bothering her "from diabetes". She states she has an appointment with the foot doctor on July 8 and declines offer of triage nurse contacting her from [facility] to schedule appointment with Raelyn Ensign, NP.  Follow up plan: Telephone follow up appointment with care management team member scheduled for: 10 days  [name], PharmD Clinical Pharmacist [facility] [phone]

## 2019-03-10 ENCOUNTER — Other Ambulatory Visit: Payer: Self-pay

## 2019-03-10 ENCOUNTER — Ambulatory Visit
Admission: RE | Admit: 2019-03-10 | Discharge: 2019-03-10 | Disposition: A | Discharge status: Home / Self Care | Payer: Medicare HMO | Source: Ambulatory Visit | Attending: Nurse Practitioner | Admitting: Nurse Practitioner

## 2019-03-10 ENCOUNTER — Ambulatory Visit: Admission: RE | Admit: 2019-03-10 | Payer: Medicare HMO | Source: Ambulatory Visit

## 2019-03-10 DIAGNOSIS — H2512 Age-related nuclear cataract, left eye: Secondary | ICD-10-CM | POA: Diagnosis not present

## 2019-03-10 DIAGNOSIS — Z122 Encounter for screening for malignant neoplasm of respiratory organs: Secondary | ICD-10-CM | POA: Diagnosis not present

## 2019-03-10 DIAGNOSIS — Z87891 Personal history of nicotine dependence: Secondary | ICD-10-CM | POA: Diagnosis not present

## 2019-03-10 DIAGNOSIS — J449 Chronic obstructive pulmonary disease, unspecified: Secondary | ICD-10-CM | POA: Diagnosis not present

## 2019-03-10 DIAGNOSIS — J439 Emphysema, unspecified: Secondary | ICD-10-CM | POA: Insufficient documentation

## 2019-03-10 DIAGNOSIS — K760 Fatty (change of) liver, not elsewhere classified: Secondary | ICD-10-CM | POA: Insufficient documentation

## 2019-03-10 DIAGNOSIS — I2510 Atherosclerotic heart disease of native coronary artery without angina pectoris: Secondary | ICD-10-CM | POA: Insufficient documentation

## 2019-03-10 DIAGNOSIS — I509 Heart failure, unspecified: Secondary | ICD-10-CM | POA: Diagnosis not present

## 2019-03-12 ENCOUNTER — Encounter: Payer: Self-pay | Admitting: *Deleted

## 2019-03-12 DIAGNOSIS — E1142 Type 2 diabetes mellitus with diabetic polyneuropathy: Secondary | ICD-10-CM | POA: Diagnosis not present

## 2019-03-12 DIAGNOSIS — B351 Tinea unguium: Secondary | ICD-10-CM | POA: Diagnosis not present

## 2019-03-12 DIAGNOSIS — L851 Acquired keratosis [keratoderma] palmaris et plantaris: Secondary | ICD-10-CM | POA: Diagnosis not present

## 2019-03-13 ENCOUNTER — Encounter: Payer: Self-pay | Admitting: *Deleted

## 2019-03-13 ENCOUNTER — Other Ambulatory Visit: Payer: Self-pay

## 2019-03-13 ENCOUNTER — Other Ambulatory Visit: Payer: Self-pay | Admitting: Family Medicine

## 2019-03-13 DIAGNOSIS — Z1231 Encounter for screening mammogram for malignant neoplasm of breast: Secondary | ICD-10-CM

## 2019-03-17 ENCOUNTER — Ambulatory Visit (INDEPENDENT_AMBULATORY_CARE_PROVIDER_SITE_OTHER): Payer: Medicare HMO | Admitting: Family Medicine

## 2019-03-17 ENCOUNTER — Other Ambulatory Visit: Payer: Self-pay

## 2019-03-17 ENCOUNTER — Encounter: Payer: Self-pay | Admitting: Family Medicine

## 2019-03-17 VITALS — BP 150/80 | HR 86 | Temp 97.1°F | Resp 16 | Ht 66.0 in | Wt 223.6 lb

## 2019-03-17 DIAGNOSIS — E1169 Type 2 diabetes mellitus with other specified complication: Secondary | ICD-10-CM

## 2019-03-17 DIAGNOSIS — F330 Major depressive disorder, recurrent, mild: Secondary | ICD-10-CM

## 2019-03-17 DIAGNOSIS — B3731 Acute candidiasis of vulva and vagina: Secondary | ICD-10-CM

## 2019-03-17 DIAGNOSIS — I10 Essential (primary) hypertension: Secondary | ICD-10-CM

## 2019-03-17 DIAGNOSIS — B373 Candidiasis of vulva and vagina: Secondary | ICD-10-CM | POA: Diagnosis not present

## 2019-03-17 DIAGNOSIS — E785 Hyperlipidemia, unspecified: Secondary | ICD-10-CM | POA: Diagnosis not present

## 2019-03-17 MED ORDER — HYDROXYZINE HCL 10 MG PO TABS: 10.0000 mg | ORAL_TABLET | Freq: Three times a day (TID) | ORAL | 0 refills | Status: DC | PRN

## 2019-03-17 MED ORDER — BORIC ACID CRYS: 600.0000 mg | CRYSTALS | Freq: Every day | 0 refills | Status: DC

## 2019-03-17 NOTE — Progress Notes (Signed)
Name: Dana Sparks   MRN: [id number]    DOB: [birth year]   Date:03/17/2019       Progress Note  Subjective  Chief Complaint  Chief Complaint  Patient presents with  . Follow-up    1 month follow up on overactive bladder and pelvic pressure. She states that symptoms are gradually getting better and she is still using the cream.  . Stress    She would like to increase the dose on her medication because of increase stress while staying with her sister that has recently become ill.    HPI  Vaginitis: diagnosed one month ago with positive culture for candida glabatra, she states she still has vaginal irritation, she has mild vaginal discharge and also pruritis , she states when she scratches she sees some bleeding .   Stress: she has a history of MDD and now her only sister was diagnosed with a brain mass and will have surgery in a few days. She is worried about it, but also losing her patient since the mass is causing her sister to get moody and is criticizing her all the time.   HTN: bp usually well controlled but has been stressed and just came from her sisters house. We will recheck before she goes home   Patient Active Problem List   Diagnosis Date Noted  . Disease of stomach   . Hx of colonic polyps   . Hepatomegaly 07/23/2018  . Fatty liver 07/23/2018  . Aortic atherosclerosis ([place]) 07/09/2018  . Abnormal endocrine laboratory test finding 06/30/2018  . Type II or unspecified type diabetes mellitus with neurological manifestations, not stated as uncontrolled(250.60) 09/03/2017  . Nasal polyp 09/03/2017  . Lipoma of back 09/03/2017  . Claudication ([place]) 06/04/2017  . LVH (left ventricular hypertrophy) 02/27/2017  . Decreased cardiac ejection fraction 02/27/2017  . Left hand weakness 10/12/2016  . Elevated antinuclear antibody (ANA) level 04/27/2016  . Angina pectoris ([place]) 11/01/2015  . Well controlled type 2 diabetes mellitus with gastroparesis ([place]) 06/22/2015  . Low TSH  level 06/22/2015  . Iron deficiency anemia 04/07/2015  . Intestinal metaplasia of gastric mucosa 03/11/2015  . CAD (coronary artery disease), native coronary artery 02/24/2015  . History of coronary artery stent placement 02/24/2015  . Chronic kidney disease, stage 3, mod decreased GFR (HCC) 02/24/2015  . Menopause 02/24/2015  . History of Helicobacter pylori infection 02/24/2015  . Mild mitral insufficiency 02/24/2015  . Mild tricuspid insufficiency 02/24/2015  . 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 ([place]) 02/24/2015  . Controlled gout 02/24/2015  . Depression, major, recurrent, mild ([place]) 02/24/2015  . Morbid obesity due to excess calories ([place]) 02/24/2015  . Mild pulmonary hypertension ([place]) 02/24/2015  . Gastroesophageal reflux disease without esophagitis 02/24/2015  . Perennial allergic rhinitis 02/24/2015  . HLD (hyperlipidemia) 02/20/2015  . Hypertension, benign 02/20/2015  . Apnea, sleep 02/20/2015  . Controlled diabetes mellitus with stage 3 chronic kidney disease, without long-term current use of insulin ([place]) 02/20/2015    Past Surgical History:  Procedure Laterality Date  . ABDOMINAL HYSTERECTOMY     total  . CATARACT EXTRACTION    . COLONOSCOPY  01/2014   sigmoid diverticulosis. desc colon TA, hyperplastic polyp  . COLONOSCOPY WITH PROPOFOL N/A 09/02/2018   Procedure: COLONOSCOPY WITH PROPOFOL;  Surgeon: Lucilla Lame, MD;  Location: [facility];  Service: Endoscopy;  Laterality: N/A;  . CORONARY ANGIOPLASTY WITH STENT PLACEMENT    . ESOPHAGOGASTRODUODENOSCOPY N/A 02/16/2015   negative h pylori, focal  gastic intestinal metaplasia  . ESOPHAGOGASTRODUODENOSCOPY (EGD) WITH PROPOFOL N/A 09/02/2018   Procedure: ESOPHAGOGASTRODUODENOSCOPY (EGD) WITH BIOPSIES;  Surgeon: Lucilla Lame, MD;  Location: [facility];  Service: Endoscopy;  Laterality: N/A;  diabetic -insulin and oral meds sleep apnea  . TOTAL KNEE ARTHROPLASTY Right      Family History  Problem Relation Age of Onset  . Stroke Sister   . Hyperlipidemia Sister   . Alcohol abuse Brother   . Diabetes Brother   . Stroke Brother   . Hypertension Mother   . Diabetes Mother   . Heart disease Mother   . Diabetes Father   . Heart disease Father   . Hypertension Father   . Hypertension Sister   . Cancer Maternal Grandmother        Unsure  . Diabetes Maternal Grandfather   . Colon cancer Neg Hx   . Liver disease Neg Hx   . Breast cancer Neg Hx     Social History   Socioeconomic History  . Marital status: Married    Spouse name: Fritz Pickerel  . Number of children: 2  . Years of education: some college  . Highest education level: 12th grade  Occupational History  . Occupation: Disabled  Social Needs  . Financial resource strain: Hard  . Food insecurity    Worry: Never true    Inability: Never true  . Transportation needs    Medical: No    Non-medical: No  Tobacco Use  . Smoking status: Former Smoker    Packs/day: 1.00    Years: 40.00    Pack years: 40.00    Types: Cigarettes    Start date: 09/04/1972    Quit date: 12/30/2012    Years since quitting: 6.2  . Smokeless tobacco: Never Used  . Tobacco comment: smoking cessation materials not required  Substance and Sexual Activity  . Alcohol use: No    Alcohol/week: 0.0 standard drinks  . Drug use: No  . Sexual activity: Not Currently  Lifestyle  . Physical activity    Days per week: 5 days    Minutes per session: 120 min  . Stress: Not at all  Relationships  . Social connections    Talks on phone: More than three times a week    Gets together: More than three times a week    Attends religious service: More than 4 times per year    Active member of club or organization: Yes    Attends meetings of clubs or organizations: More than 4 times per year    Relationship status: Married  . Intimate partner violence    Fear of current or ex partner: No    Emotionally abused: No    Physically  abused: No    Forced sexual activity: No  Other Topics Concern  . Not on file  Social History Narrative   Married - current 76rd,    Two children from first marriage   He has 4 children from previous marriage     Current Outpatient Medications:  .  ACCU-CHEK FASTCLIX LANCETS MISC, , Disp: , Rfl:  .  ACCU-CHEK SMARTVIEW test strip, Check fsbs two times daily  DMII, Disp: 100 each, Rfl: 6 .  Alcohol Swabs (B-D SINGLE USE SWABS REGULAR) PADS, 1 each by Does not apply route 4 (four) times daily., Disp: 200 each, Rfl: 2 .  allopurinol (ZYLOPRIM) 100 MG tablet, , Disp: , Rfl:  .  amLODipine (NORVASC) 5 MG tablet, Take 5 mg by mouth  daily., Disp: , Rfl:  .  aspirin EC 81 MG tablet, Take 81 mg by mouth every morning. , Disp: , Rfl:  .  BD INSULIN SYRINGE U/F 31G X 5/16" 0.3 ML MISC, , Disp: , Rfl:  .  blood glucose meter kit and supplies KIT, Dispense based on patient and insurance preference. Use up to four times daily as directed. (FOR ICD-9 250.00, 250.01)., Disp: 1 each, Rfl: 0 .  Budesonide-Formoterol Fumarate (SYMBICORT IN), Inhale into the lungs daily., Disp: , Rfl:  .  Cholecalciferol (VITAMIN D-3 PO), Take 1,000 Units by mouth 2 (two) times a day., Disp: , Rfl:  .  cloNIDine (CATAPRES) 0.1 MG tablet, Take 1 tablet (0.1 mg total) by mouth every evening. Start taking 1 tablet at bedtime for sweating, may increase to twice daily as tolerated., Disp: 90 tablet, Rfl: 0 .  Coenzyme Q10 100 MG capsule, TAKE 1 CAPSULE EVERY DAY, Disp: 90 capsule, Rfl: 1 .  cyclobenzaprine (FLEXERIL) 5 MG tablet, Take 1 tablet (5 mg total) by mouth 3 (three) times daily as needed for muscle spasms., Disp: 8 tablet, Rfl: 0 .  Estradiol (ESTRACE VA), Place vaginally as needed., Disp: , Rfl:  .  Ferrous Sulfate (IRON) 325 (65 Fe) MG TABS, Take 325 mg by mouth 2 (two) times daily. Nature made brand-  Pt takes one tablet twice a day , Disp: , Rfl:  .  fluticasone (FLONASE) 50 MCG/ACT nasal spray, Place 2 sprays into  both nostrils daily., Disp: 48 g, Rfl: 2 .  glipiZIDE (GLUCOTROL XL) 5 MG 24 hr tablet, Take 1 tablet by mouth daily., Disp: , Rfl:  .  insulin NPH Human (HUMULIN N,NOVOLIN N) 100 UNIT/ML injection, Inject 25-30 Units into the skin 2 (two) times daily before a meal. 25 units in the morning and 30 units at bedtime, Disp: , Rfl:  .  ipratropium (ATROVENT) 0.03 % nasal spray, Place 2 sprays into both nostrils every 12 (twelve) hours., Disp: 1 mL, Rfl: 0 .  isosorbide mononitrate (IMDUR) 60 MG 24 hr tablet, Take 1 tablet (60 mg total) by mouth daily., Disp: 90 tablet, Rfl: 1 .  lisinopril (PRINIVIL,ZESTRIL) 20 MG tablet, Take 1 tablet (20 mg total) by mouth daily., Disp: 90 tablet, Rfl: 1 .  loratadine (CLARITIN) 10 MG tablet, Take 1 tablet (10 mg total) by mouth daily., Disp: 30 tablet, Rfl: 3 .  meloxicam (MOBIC) 7.5 MG tablet, Take by mouth., Disp: , Rfl:  .  metoprolol succinate (TOPROL-XL) 50 MG 24 hr tablet, Take 1 tablet (50 mg total) by mouth daily., Disp: 90 tablet, Rfl: 3 .  montelukast (SINGULAIR) 10 MG tablet, Take 1 tablet (10 mg total) by mouth every evening., Disp: 90 tablet, Rfl: 1 .  pantoprazole (PROTONIX) 40 MG tablet, Take 1 tablet (40 mg total) by mouth every morning., Disp: 90 tablet, Rfl: 1 .  rosuvastatin (CRESTOR) 20 MG tablet, Take 1 tablet (20 mg total) by mouth daily., Disp: 90 tablet, Rfl: 1 .  SYNJARDY XR 12.01-999 MG TB24, Take 2 tablets by mouth daily., Disp: , Rfl:  .  traMADol (ULTRAM) 50 MG tablet, Take by mouth every 6 (six) hours as needed., Disp: , Rfl:  .  triamcinolone cream (KENALOG) 0.1 %, Apply 1 application topically 2 (two) times daily., Disp: 30 g, Rfl: 0 .  umeclidinium-vilanterol (ANORO ELLIPTA) 62.5-25 MCG/INH AEPB, Inhale 1 puff into the lungs daily., Disp: 270 each, Rfl: 1 .  venlafaxine XR (EFFEXOR-XR) 75 MG 24 hr capsule, Take one  tablet a day., Disp: 90 capsule, Rfl: 1 .  Boric Acid CRYS, Place 600 mg vaginally at bedtime., Disp: 12 g, Rfl: 0 .   hydrOXYzine (ATARAX/VISTARIL) 10 MG tablet, Take 1 tablet (10 mg total) by mouth 3 (three) times daily as needed., Disp: 30 tablet, Rfl: 0  Allergies  Allergen Reactions  . Codeine Other (See Comments)    Unknown reaction  . Contrast Media [Iodinated Diagnostic Agents] Itching  . Sulfa Antibiotics Itching    I personally reviewed active problem list, medication list, allergies, family history, social history with the patient/caregiver today.   ROS  Constitutional: Negative for fever or weight change.  Respiratory: Negative for cough and shortness of breath.   Cardiovascular: Negative for chest pain or palpitations.  Gastrointestinal: Negative for abdominal pain, no bowel changes.  Musculoskeletal: Negative for gait problem or joint swelling.  Skin: Negative for rash.  Neurological: Negative for dizziness or headache.  No other specific complaints in a complete review of systems (except as listed in HPI above).  Objective  Vitals:   03/17/19 0805  BP: (!) 150/84  Pulse: 86  Resp: 16  Temp: (!) 97.1 F (36.2 C)  TempSrc: Oral  SpO2: 98%  Weight: 223 lb 9.6 oz (101.4 kg)  Height: '5\' 6"'  (1.676 m)    Body mass index is 36.09 kg/m.  Physical Exam  Constitutional: Patient appears well-developed and well-nourished. Obese  No distress.  HEENT: head atraumatic, normocephalic, pupils equal and reactive to light, neck supple Cardiovascular: Normal rate, regular rhythm and normal heart sounds.  No murmur heard. No BLE edema. Pulmonary/Chest: Effort normal and breath sounds normal. No respiratory distress. Abdominal: Soft.  There is no tenderness. Psychiatric: Patient has a normal mood and affect. behavior is normal. Judgment and thought content normal.  Recent Results (from the past 2160 hour(s))  Hemoglobin A1c     Status: None   Collection Time: 01/07/19 12:00 AM  Result Value Ref Range   Hemoglobin A1C 7.8   Cervicovaginal ancillary only     Status: Abnormal    Collection Time: 02/12/19 12:00 AM  Result Value Ref Range   Bacterial vaginitis Negative for Bacterial Vaginitis Microorganisms     Comment: Normal Reference Range - Negative   Candida vaginitis (A)     **POSITIVE for Candida species and Candida glabrata**    Comment: Normal Reference Range - Negative   Trichomonas Negative     Comment: Normal Reference Range - Negative  POCT urinalysis dipstick     Status: Abnormal   Collection Time: 02/12/19 11:32 AM  Result Value Ref Range   Color, UA Yellow    Clarity, UA clear    Glucose, UA Positive (A) Negative   Bilirubin, UA Negative    Ketones, UA Negative    Spec Grav, UA 1.025 1.010 - 1.025   Blood, UA Trace    pH, UA 5.0 5.0 - 8.0   Protein, UA Negative Negative   Urobilinogen, UA negative (A) 0.2 or 1.0 E.U./dL   Nitrite, UA Negative    Leukocytes, UA Negative Negative   Appearance Negative    Odor None   Urinalysis, Routine w reflex microscopic     Status: Abnormal   Collection Time: 02/12/19 11:59 AM  Result Value Ref Range   Color, Urine YELLOW YELLOW   APPearance CLEAR CLEAR   Specific Gravity, Urine 1.028 1.001 - 1.03   pH < OR = 5.0 5.0 - 8.0   Glucose, UA 3+ (A) NEGATIVE   Bilirubin  Urine NEGATIVE NEGATIVE   Ketones, ur NEGATIVE NEGATIVE   Hgb urine dipstick NEGATIVE NEGATIVE   Protein, ur NEGATIVE NEGATIVE   Nitrite NEGATIVE NEGATIVE   Leukocytes,Ua NEGATIVE NEGATIVE      PHQ2/9: Depression screen [facility] 2/9 03/17/2019 02/12/2019 02/06/2019 01/15/2019 12/31/2018  Decreased Interest 0 0 '2 1 2  ' Down, Depressed, Hopeless 0 0 0 0 2  PHQ - 2 Score 0 0 '2 1 4  ' Altered sleeping 0 0 0 0 3  Tired, decreased energy 0 0 '1 1 2  ' Change in appetite 0 0 0 0 3  Feeling bad or failure about yourself  0 0 0 0 1  Trouble concentrating 0 0 0 1 2  Moving slowly or fidgety/restless 0 0 0 0 0  Suicidal thoughts 0 0 0 0 0  PHQ-9 Score 0 0 '3 3 15  ' Difficult doing work/chores - Not difficult at all Not difficult at all Somewhat difficult  Somewhat difficult  Some recent data might be hidden    phq 9 is negative   Fall Risk: Fall Risk  03/17/2019 02/12/2019 02/06/2019 11/07/2018 08/19/2018  Falls in the past year? 0 0 0 1 1  Number falls in past yr: 0 - 0 0 1  Injury with Fall? 0 - 0 0 0  Comment - - - - -  Risk for fall due to : - - - - -     Functional Status Survey: Is the patient deaf or have difficulty hearing?: No Does the patient have difficulty seeing, even when wearing glasses/contacts?: No Does the patient have difficulty concentrating, remembering, or making decisions?: No Does the patient have difficulty walking or climbing stairs?: No Does the patient have difficulty dressing or bathing?: No Does the patient have difficulty doing errands alone such as visiting a doctor's office or shopping?: No    Assessment & Plan   1. Candida vaginitis  - Boric Acid CRYS; Place 600 mg vaginally at bedtime.  Dispense: 12 g; Refill: 0 Explained that she can die if she takes it by mouth  2. Depression, major, recurrent, mild (HCC)  Continue Effexor and we will add hydroxyzine prn   3. Dyslipidemia associated with type 2 diabetes mellitus (HCC)  - Lipid panel - COMPLETE METABOLIC PANEL WITH GFR - Microalbumin / creatinine urine ratio  4. Hypertension, benign  - CBC with Differential/Platelet

## 2019-03-18 ENCOUNTER — Ambulatory Visit: Payer: Medicare HMO | Admitting: Pharmacist

## 2019-03-18 DIAGNOSIS — J410 Simple chronic bronchitis: Secondary | ICD-10-CM

## 2019-03-18 DIAGNOSIS — E1143 Type 2 diabetes mellitus with diabetic autonomic (poly)neuropathy: Secondary | ICD-10-CM

## 2019-03-18 LAB — COMPLETE METABOLIC PANEL WITH GFR
AG Ratio: 1.8 (calc) (ref 1.0–2.5)
ALT: 12 U/L (ref 6–29)
AST: 12 U/L (ref 10–35)
Albumin: 4.5 g/dL (ref 3.6–5.1)
Alkaline phosphatase (APISO): 70 U/L (ref 37–153)
BUN: 15 mg/dL (ref 7–25)
CO2: 28 mmol/L (ref 20–32)
Calcium: 10.5 mg/dL — ABNORMAL HIGH (ref 8.6–10.4)
Chloride: 104 mmol/L (ref 98–110)
Creat: 0.88 mg/dL (ref 0.50–0.99)
GFR, Est African American: 79 mL/min/{1.73_m2} (ref >=60)
GFR, Est Non African American: 68 mL/min/{1.73_m2} (ref >=60)
Globulin: 2.5 g/dL (calc) (ref 1.9–3.7)
Glucose, Bld: 80 mg/dL (ref 65–99)
Potassium: 4.3 mmol/L (ref 3.5–5.3)
Sodium: 141 mmol/L (ref 135–146)
Total Bilirubin: 0.4 mg/dL (ref 0.2–1.2)
Total Protein: 7 g/dL (ref 6.1–8.1)

## 2019-03-18 LAB — CBC WITH DIFFERENTIAL/PLATELET
Absolute Monocytes: 536 cells/uL (ref 200–950)
Basophils Absolute: 34 cells/uL (ref 0–200)
Basophils Relative: 0.5 %
Eosinophils Absolute: 221 cells/uL (ref 15–500)
Eosinophils Relative: 3.3 %
HCT: 43.2 % (ref 35.0–45.0)
Hemoglobin: 14.4 g/dL (ref 11.7–15.5)
Lymphs Abs: 3337 cells/uL (ref 850–3900)
MCH: 30 pg (ref 27.0–33.0)
MCHC: 33.3 g/dL (ref 32.0–36.0)
MCV: 90 fL (ref 80.0–100.0)
MPV: 10.6 fL (ref 7.5–12.5)
Monocytes Relative: 8 %
Neutro Abs: 2573 cells/uL (ref 1500–7800)
Neutrophils Relative %: 38.4 %
Platelets: 293 10*3/uL (ref 140–400)
RBC: 4.8 10*6/uL (ref 3.80–5.10)
RDW: 12.9 % (ref 11.0–15.0)
Total Lymphocyte: 49.8 %
WBC: 6.7 10*3/uL (ref 3.8–10.8)

## 2019-03-18 LAB — MICROALBUMIN / CREATININE URINE RATIO
Creatinine, Urine: 39 mg/dL (ref 20–275)
Microalb Creat Ratio: 21 mcg/mg creat (ref <30)
Microalb, Ur: 0.8 mg/dL

## 2019-03-18 LAB — LIPID PANEL
Cholesterol: 138 mg/dL (ref <200)
HDL: 53 mg/dL (ref >=50)
LDL Cholesterol (Calc): 66 mg/dL (calc)
Non-HDL Cholesterol (Calc): 85 mg/dL (calc) (ref <130)
Total CHOL/HDL Ratio: 2.6 (calc) (ref <5.0)
Triglycerides: 100 mg/dL (ref <150)

## 2019-03-18 NOTE — Chronic Care Management (AMB) (Signed)
  Chronic Care Management   Follow Up Note   03/18/2019 Name: Dana Sparks MRN: [id number] DOB: 11/16/1950  Referred by: Steele Sizer, MD Reason for referral : Chronic Care Management (pharmacy follow up)   Dana Sparks is a 68 y.o. year old female who is a primary care patient of Steele Sizer, MD. The CCM clinical pharmacist following up today for medication assistance (Synjardy XR and Anoro Ellipta). HIPAA identifiers verified.  Review of patient status, including review of consultants reports, relevant laboratory and other test results, and collaboration with appropriate care team members and the patient's provider was performed as part of comprehensive patient evaluation and provision of chronic care management services.    Goals Addressed            This Visit's Progress   . I am having trouble affording some of my medicines (pt-stated)   Not on track    Current Barriers:  . financial  Pharmacist Clinical Goal(s): Over the next 14 days, Dana Sparks will provide the necessary supplementary documents (proof of out of pocket prescription expenditure, proof of household income) needed for medication assistance applications to CCM pharmacist.   Interventions: . CCM pharmacist will apply for medication assistance program for Anoro Ellipta made by [place] and Synjardy XR made by [employer].  o Updated 03/18/19: patient has received applications but has no questions; reviewed necessary documents needed   Patient Self Care Activities:  Marland Kitchen Gather necessary documents needed to apply for medication assistance  Please see past updates related to this goal by clicking on the "Past Updates" button in the selected goal          Telephone follow up appointment with care management team member scheduled for: 2 weeks with PharmD   [name], PharmD Clinical Pharmacist [facility] [phone]

## 2019-03-18 NOTE — Patient Instructions (Signed)
Thank you for taking the time to speak with me today!!   Goals Addressed            This Visit's Progress   . I am having trouble affording some of my medicines (pt-stated)   Not on track    Current Barriers:  . financial  Pharmacist Clinical Goal(s): Over the next 14 days, [name] will provide the necessary supplementary documents (proof of out of pocket prescription expenditure, proof of household income) needed for medication assistance applications to CCM pharmacist.   Interventions: . CCM pharmacist will apply for medication assistance program for Anoro Ellipta made by [place] and Synjardy XR made by [employer].  o Updated 03/18/19: patient has received applications but has no questions; reviewed necessary documents needed   Patient Self Care Activities:  Marland Kitchen Gather necessary documents needed to apply for medication assistance  Please see past updates related to this goal by clicking on the "Past Updates" button in the selected goal

## 2019-03-20 ENCOUNTER — Other Ambulatory Visit
Admission: RE | Admit: 2019-03-20 | Discharge: 2019-03-20 | Disposition: A | Discharge status: Home / Self Care | Payer: Medicare HMO | Source: Ambulatory Visit | Attending: Ophthalmology | Admitting: Ophthalmology

## 2019-03-20 ENCOUNTER — Other Ambulatory Visit: Payer: Self-pay

## 2019-03-20 DIAGNOSIS — Z1159 Encounter for screening for other viral diseases: Secondary | ICD-10-CM | POA: Diagnosis not present

## 2019-03-20 NOTE — Discharge Instructions (Signed)

## 2019-03-21 ENCOUNTER — Other Ambulatory Visit: Payer: Self-pay

## 2019-03-21 ENCOUNTER — Telehealth: Payer: Self-pay

## 2019-03-21 DIAGNOSIS — B3731 Acute candidiasis of vulva and vagina: Secondary | ICD-10-CM

## 2019-03-21 DIAGNOSIS — B373 Candidiasis of vulva and vagina: Secondary | ICD-10-CM

## 2019-03-21 LAB — SARS CORONAVIRUS 2 (TAT 6-24 HRS): SARS Coronavirus 2: NEGATIVE

## 2019-03-21 NOTE — Telephone Encounter (Signed)
Copied from [place] [id number]. Topic: General - Inquiry >> Mar 20, 2019  1:58 PM Berneta Levins wrote: Reason for CRM:   Pt states that [facility]).  Pt states she was told by [facility] they do not do combination prescriptions and that it needs to be sent elsewhere.  Pt doesn't have a pharmacy preference as she is unsure where this needs to go.  [facility] in [place] is the only pharmacy that I know that makes the Boric Acid pills. Please resend rx to [facility].

## 2019-03-21 NOTE — Telephone Encounter (Signed)
[facility] does not compound Bor[facility]. Please send to [facility] in [place].

## 2019-03-23 ENCOUNTER — Other Ambulatory Visit: Payer: Self-pay | Admitting: Family Medicine

## 2019-03-23 DIAGNOSIS — B3731 Acute candidiasis of vulva and vagina: Secondary | ICD-10-CM

## 2019-03-23 DIAGNOSIS — B373 Candidiasis of vulva and vagina: Secondary | ICD-10-CM

## 2019-03-23 MED ORDER — BORIC ACID CRYS: 600.0000 mg | CRYSTALS | Freq: Every day | 0 refills | Status: DC

## 2019-03-24 ENCOUNTER — Ambulatory Visit
Admission: RE | Admit: 2019-03-24 | Discharge: 2019-03-24 | Disposition: A | Discharge status: Home / Self Care | Payer: Medicare HMO | Attending: Ophthalmology | Admitting: Ophthalmology

## 2019-03-24 ENCOUNTER — Ambulatory Visit: Payer: Medicare HMO | Admitting: Anesthesiology

## 2019-03-24 ENCOUNTER — Encounter
Admission: RE | Disposition: A | Discharge status: Home / Self Care | Payer: Self-pay | Source: Home / Self Care | Attending: Ophthalmology

## 2019-03-24 ENCOUNTER — Other Ambulatory Visit: Payer: Self-pay

## 2019-03-24 DIAGNOSIS — K219 Gastro-esophageal reflux disease without esophagitis: Secondary | ICD-10-CM | POA: Diagnosis not present

## 2019-03-24 DIAGNOSIS — J449 Chronic obstructive pulmonary disease, unspecified: Secondary | ICD-10-CM | POA: Insufficient documentation

## 2019-03-24 DIAGNOSIS — F329 Major depressive disorder, single episode, unspecified: Secondary | ICD-10-CM | POA: Diagnosis not present

## 2019-03-24 DIAGNOSIS — H2512 Age-related nuclear cataract, left eye: Secondary | ICD-10-CM | POA: Diagnosis not present

## 2019-03-24 DIAGNOSIS — M109 Gout, unspecified: Secondary | ICD-10-CM | POA: Insufficient documentation

## 2019-03-24 DIAGNOSIS — Z955 Presence of coronary angioplasty implant and graft: Secondary | ICD-10-CM | POA: Diagnosis not present

## 2019-03-24 DIAGNOSIS — I110 Hypertensive heart disease with heart failure: Secondary | ICD-10-CM | POA: Insufficient documentation

## 2019-03-24 DIAGNOSIS — Z7982 Long term (current) use of aspirin: Secondary | ICD-10-CM | POA: Insufficient documentation

## 2019-03-24 DIAGNOSIS — E7800 Pure hypercholesterolemia, unspecified: Secondary | ICD-10-CM | POA: Insufficient documentation

## 2019-03-24 DIAGNOSIS — E1136 Type 2 diabetes mellitus with diabetic cataract: Secondary | ICD-10-CM | POA: Insufficient documentation

## 2019-03-24 DIAGNOSIS — Z7984 Long term (current) use of oral hypoglycemic drugs: Secondary | ICD-10-CM | POA: Diagnosis not present

## 2019-03-24 DIAGNOSIS — I2510 Atherosclerotic heart disease of native coronary artery without angina pectoris: Secondary | ICD-10-CM | POA: Diagnosis not present

## 2019-03-24 DIAGNOSIS — Z79899 Other long term (current) drug therapy: Secondary | ICD-10-CM | POA: Insufficient documentation

## 2019-03-24 DIAGNOSIS — Z96651 Presence of right artificial knee joint: Secondary | ICD-10-CM | POA: Insufficient documentation

## 2019-03-24 DIAGNOSIS — G4730 Sleep apnea, unspecified: Secondary | ICD-10-CM | POA: Diagnosis not present

## 2019-03-24 DIAGNOSIS — Z87891 Personal history of nicotine dependence: Secondary | ICD-10-CM | POA: Diagnosis not present

## 2019-03-24 DIAGNOSIS — I509 Heart failure, unspecified: Secondary | ICD-10-CM | POA: Diagnosis not present

## 2019-03-24 DIAGNOSIS — H25812 Combined forms of age-related cataract, left eye: Secondary | ICD-10-CM | POA: Diagnosis not present

## 2019-03-24 HISTORY — PX: CATARACT EXTRACTION W/PHACO: SHX586

## 2019-03-24 LAB — GLUCOSE, CAPILLARY
Glucose-Capillary: 178 mg/dL — ABNORMAL HIGH (ref 70–99)
Glucose-Capillary: 154 mg/dL — ABNORMAL HIGH (ref 70–99)

## 2019-03-24 SURGERY — PHACOEMULSIFICATION, CATARACT, WITH IOL INSERTION
Anesthesia: Monitor Anesthesia Care | Site: Eye | Laterality: Left

## 2019-03-24 MED ORDER — FENTANYL CITRATE (PF) 100 MCG/2ML IJ SOLN
INTRAMUSCULAR | Status: DC | PRN
  Administered 2019-03-24: 50 ug via INTRAVENOUS

## 2019-03-24 MED ORDER — SODIUM HYALURONATE 10 MG/ML IO SOLN
INTRAOCULAR | Status: DC | PRN
  Administered 2019-03-24: 0.55 mL via INTRAOCULAR

## 2019-03-24 MED ORDER — TETRACAINE HCL 0.5 % OP SOLN
1.0000 [drp] | OPHTHALMIC | Status: DC | PRN
  Administered 2019-03-24 (×3): 1 [drp] via OPHTHALMIC

## 2019-03-24 MED ORDER — SODIUM HYALURONATE 23 MG/ML IO SOLN
INTRAOCULAR | Status: DC | PRN
  Administered 2019-03-24: 0.6 mL via INTRAOCULAR

## 2019-03-24 MED ORDER — ARMC OPHTHALMIC DILATING DROPS
1.0000 "application " | OPHTHALMIC | Status: DC | PRN
  Administered 2019-03-24 (×3): 1 via OPHTHALMIC

## 2019-03-24 MED ORDER — MIDAZOLAM HCL 2 MG/2ML IJ SOLN
INTRAMUSCULAR | Status: DC | PRN
  Administered 2019-03-24 (×2): 1 mg via INTRAVENOUS

## 2019-03-24 MED ORDER — EPINEPHRINE PF 1 MG/ML IJ SOLN
INTRAOCULAR | Status: DC | PRN
  Administered 2019-03-24: 102 mL via OPHTHALMIC

## 2019-03-24 MED ORDER — LIDOCAINE HCL (PF) 2 % IJ SOLN
INTRAOCULAR | Status: DC | PRN
  Administered 2019-03-24: 2 mL via INTRAOCULAR

## 2019-03-24 MED ORDER — MOXIFLOXACIN HCL 0.5 % OP SOLN
OPHTHALMIC | Status: DC | PRN
  Administered 2019-03-24: 0.2 mL via OPHTHALMIC

## 2019-03-24 SURGICAL SUPPLY — 20 items
CANNULA ANT/CHMB 27G (MISCELLANEOUS) ×2 IMPLANT
CANNULA ANT/CHMB 27GA (MISCELLANEOUS) ×6 IMPLANT
DISSECTOR HYDRO NUCLEUS 50X22 (MISCELLANEOUS) ×3 IMPLANT
GLOVE SURG LX 7.5 STRW (GLOVE) ×2
GLOVE SURG LX STRL 7.5 STRW (GLOVE) ×1 IMPLANT
GLOVE SURG SYN 8.5  E (GLOVE) ×2
GLOVE SURG SYN 8.5 E (GLOVE) ×1 IMPLANT
GLOVE SURG SYN 8.5 PF PI (GLOVE) ×1 IMPLANT
GOWN STRL REUS W/ TWL LRG LVL3 (GOWN DISPOSABLE) ×2 IMPLANT
GOWN STRL REUS W/TWL LRG LVL3 (GOWN DISPOSABLE) ×4
LENS IOL ACRSF IQ ULTRA 19.0 (Intraocular Lens) IMPLANT
LENS IOL ACRYSOF IQ 19.0 (Intraocular Lens) ×3 IMPLANT
MARKER SKIN DUAL TIP RULER LAB (MISCELLANEOUS) ×3 IMPLANT
PACK DR. KING ARMS (PACKS) ×3 IMPLANT
PACK EYE AFTER SURG (MISCELLANEOUS) ×3 IMPLANT
PACK OPTHALMIC (MISCELLANEOUS) ×3 IMPLANT
SYR 3ML LL SCALE MARK (SYRINGE) ×3 IMPLANT
SYR TB 1ML LUER SLIP (SYRINGE) ×3 IMPLANT
WATER STERILE IRR 250ML POUR (IV SOLUTION) ×3 IMPLANT
WIPE NON LINTING 3.25X3.25 (MISCELLANEOUS) ×3 IMPLANT

## 2019-03-24 NOTE — Anesthesia Postprocedure Evaluation (Signed)
Anesthesia Post Note  Patient: Dana Sparks  Procedure(s) Performed: CATARACT EXTRACTION PHACO AND INTRAOCULAR LENS PLACEMENT (IOC)  LEFT DIABETIC (Left Eye)  Patient location during evaluation: PACU Anesthesia Type: MAC Level of consciousness: awake Pain management: pain level controlled Vital Signs Assessment: post-procedure vital signs reviewed and stable Respiratory status: spontaneous breathing Cardiovascular status: stable Postop Assessment: no apparent nausea or vomiting and adequate PO intake Anesthetic complications: no    Adele Barthel Carisa Backhaus

## 2019-03-24 NOTE — H&P (Signed)

## 2019-03-24 NOTE — Anesthesia Preprocedure Evaluation (Signed)
Anesthesia Evaluation  Patient identified by MRN, date of birth, ID band Patient awake    History of Anesthesia Complications Negative for: history of anesthetic complications  Airway Mallampati: II  TM Distance: >3 FB Neck ROM: Full    Dental  (+) Partial Upper   Pulmonary sleep apnea , COPD, former smoker,    Pulmonary exam normal        Cardiovascular Exercise Tolerance: Good hypertension, Pt. on medications and Pt. on home beta blockers + CAD and + Cardiac Stents (placed over 5 years ago)  Normal cardiovascular exam     Neuro/Psych negative neurological ROS  negative psych ROS   GI/Hepatic GERD  Medicated and Controlled,  Endo/Other  diabetes  Renal/GU   negative genitourinary   Musculoskeletal negative musculoskeletal ROS (+)   Abdominal   Peds  Hematology negative hematology ROS (+)   Anesthesia Other Findings   Reproductive/Obstetrics negative OB ROS                             Anesthesia Physical Anesthesia Plan  ASA: III  Anesthesia Plan: MAC   Post-op Pain Management:    Induction: Intravenous  PONV Risk Score and Plan:   Airway Management Planned: Nasal Cannula  Additional Equipment: None  Intra-op Plan:   Post-operative Plan:   Informed Consent: I have reviewed the patients History and Physical, chart, labs and discussed the procedure including the risks, benefits and alternatives for the proposed anesthesia with the patient or authorized representative who has indicated his/her understanding and acceptance.       Plan Discussed with: CRNA  Anesthesia Plan Comments:         Anesthesia Quick Evaluation

## 2019-03-24 NOTE — Op Note (Signed)
OPERATIVE NOTE  Dana Sparks [id number] 03/24/2019   PREOPERATIVE DIAGNOSIS:  Nuclear sclerotic cataract left eye.  H25.12   POSTOPERATIVE DIAGNOSIS:    Nuclear sclerotic cataract left eye.     PROCEDURE:  Phacoemusification with posterior chamber intraocular lens placement of the left eye   LENS:   Implant Name Type Inv. Item Serial No. Manufacturer Lot No. LRB No. Used Action  LENS IOL ACRYSOF IQ 19.0 - [id number] Intraocular Lens LENS IOL ACRYSOF IQ 19.0 [id number] [employer]  Left 1 Implanted       [id number] +19.0   ULTRASOUND TIME: 1 minutes 42 seconds.  CDE 23.22   SURGEON:  Benay Pillow, MD, MPH   ANESTHESIA:  Topical with tetracaine drops augmented with 1% preservative-free intracameral lidocaine.  ESTIMATED BLOOD LOSS: <1 mL   COMPLICATIONS:  None.   DESCRIPTION OF PROCEDURE:  The patient was identified in the holding room and transported to the operating room and placed in the supine position under the operating microscope.  The left eye was identified as the operative eye and it was prepped and draped in the usual sterile ophthalmic fashion.   A 1.0 millimeter clear-corneal paracentesis was made at the 5:00 position. 0.5 ml of preservative-free 1% lidocaine with epinephrine was injected into the anterior chamber.  The anterior chamber was filled with Healon 5 viscoelastic.  A 2.4 millimeter keratome was used to make a near-clear corneal incision at the 2:00 position.  A curvilinear capsulorrhexis was made with a cystotome and capsulorrhexis forceps.  Balanced salt solution was used to hydrodissect and hydrodelineate the nucleus.   Phacoemulsification was then used in stop and chop fashion to remove the lens nucleus and epinucleus.  The remaining cortex was then removed using the irrigation and aspiration handpiece. Healon was then placed into the capsular bag to distend it for lens placement.  A lens was then injected into the capsular bag.  The remaining viscoelastic was  aspirated.  The lens was very dense.  An anterior capsular tear was noted at 11:00 but the posterior capsule remained intact.  The lens haptics were oriented away from the tear.     Wounds were hydrated with balanced salt solution.  The anterior chamber was inflated to a physiologic pressure with balanced salt solution.  The lens was well centered and stable.  Intracameral vigamox 0.1 mL undiltued was injected into the eye and a drop placed onto the ocular surface.  No wound leaks were noted.  The patient was taken to the recovery room in stable condition without complications of anesthesia or surgery  Benay Pillow 03/24/2019, 9:42 AM

## 2019-03-24 NOTE — Transfer of Care (Signed)
Immediate Anesthesia Transfer of Care Note  Patient: Dana Sparks  Procedure(s) Performed: CATARACT EXTRACTION PHACO AND INTRAOCULAR LENS PLACEMENT (IOC)  LEFT DIABETIC (Left Eye)  Patient Location: PACU  Anesthesia Type: MAC  Level of Consciousness: awake, alert  and patient cooperative  Airway and Oxygen Therapy: Patient Spontanous Breathing and Patient connected to supplemental oxygen  Post-op Assessment: Post-op Vital signs reviewed, Patient's Cardiovascular Status Stable, Respiratory Function Stable, Patent Airway and No signs of Nausea or vomiting  Post-op Vital Signs: Reviewed and stable  Complications: No apparent anesthesia complications

## 2019-03-24 NOTE — Anesthesia Procedure Notes (Signed)
Procedure Name: MAC Date/Time: 03/24/2019 9:16 AM Performed by: Cameron Ali, CRNA Pre-anesthesia Checklist: Patient identified, Emergency Drugs available, Suction available, Timeout performed and Patient being monitored Patient Re-evaluated:Patient Re-evaluated prior to induction Oxygen Delivery Method: Nasal cannula Placement Confirmation: positive ETCO2

## 2019-03-25 ENCOUNTER — Encounter: Payer: Self-pay | Admitting: Ophthalmology

## 2019-03-25 ENCOUNTER — Other Ambulatory Visit: Payer: Self-pay | Admitting: Family Medicine

## 2019-03-25 DIAGNOSIS — R61 Generalized hyperhidrosis: Secondary | ICD-10-CM

## 2019-03-28 ENCOUNTER — Ambulatory Visit: Payer: Self-pay | Admitting: Pharmacist

## 2019-03-28 DIAGNOSIS — E1143 Type 2 diabetes mellitus with diabetic autonomic (poly)neuropathy: Secondary | ICD-10-CM

## 2019-03-28 NOTE — Patient Instructions (Signed)
Goals Addressed            This Visit's Progress   . I am having trouble affording some of my medicines (pt-stated)       Current Barriers:  . financial  Pharmacist Clinical Goal(s): Over the next 14 days, [name] will provide the necessary supplementary documents (proof of out of pocket prescription expenditure, proof of household income) needed for medication assistance applications to CCM pharmacist.   Interventions: . CCM pharmacist will apply for medication assistance program for Anoro Ellipta made by [place] and Synjardy XR made by [employer].  o Updated 03/18/19: patient has received applications but has no questions; reviewed necessary documents needed  Updated 03/28/19: patient is in the Medicare gap; completed Synjardy XR application together over the telephone; patient will return application and needed documents to the [place] office;   Patient Self Care Activities:  Marland Kitchen Gather necessary documents needed to apply for medication assistance  Please see past updates related to this goal by clicking on the "Past Updates" button in the selected goal

## 2019-03-28 NOTE — Chronic Care Management (AMB) (Signed)
  Chronic Care Management   Follow Up Note   03/28/2019 Name: AHRI OLSON MRN: [id number] DOB: 04-07-1951  Subjective KAITLEN REDFORD is a 68 y.o. year old female who is a primary care patient of Steele Sizer, MD. The CCM clinical pharmacist received incoming call from patient today regarding Synjardy medication assistance application. HIPAA identifiers verified.  Assessment Review of patient status, including review of consultants reports, relevant laboratory and other test results, and collaboration with appropriate care team members and the patient's provider was performed as part of comprehensive patient evaluation and provision of chronic care management services.    Patient states she has met the Medicare gap. Together we complete the [place] application over the telephone. Review necessary supplementary documents. Patient will drop off application at the [place] office by EOB 03/28/19/  Goals Addressed            This Visit's Progress   . I am having trouble affording some of my medicines (pt-stated)       Current Barriers:  . financial  Pharmacist Clinical Goal(s): Over the next 14 days, [name] will provide the necessary supplementary documents (proof of out of pocket prescription expenditure, proof of household income) needed for medication assistance applications to CCM pharmacist.   Interventions: . CCM pharmacist will apply for medication assistance program for Anoro Ellipta made by [place] and Synjardy XR made by [employer].  o Updated 03/18/19: patient has received applications but has no questions; reviewed necessary documents needed  Updated 03/28/19: patient is in the Medicare gap; completed Synjardy XR application together over the telephone; patient will return application and needed documents to the [place] office;   Patient Self Care Activities:  Marland Kitchen Gather necessary documents needed to apply for medication assistance  Please see past updates related  to this goal by clicking on the "Past Updates" button in the selected goal          Telephone follow up appointment with care management team member scheduled for: care coordination - PharmD to submit completed Synjardy application 2/86/38   [name], PharmD Clinical Pharmacist [facility] [facility] [phone]

## 2019-04-01 ENCOUNTER — Ambulatory Visit: Payer: Self-pay | Admitting: Pharmacist

## 2019-04-01 DIAGNOSIS — E1143 Type 2 diabetes mellitus with diabetic autonomic (poly)neuropathy: Secondary | ICD-10-CM

## 2019-04-01 DIAGNOSIS — J410 Simple chronic bronchitis: Secondary | ICD-10-CM

## 2019-04-01 NOTE — Chronic Care Management (AMB) (Signed)
  Chronic Care Management   Follow Up Note   04/01/2019 Name: Dana Sparks MRN: [id number] DOB: 1951-03-19  Subjective Dana Sparks is a 68 y.o. year old female who is a primary care patient of Steele Sizer, MD. The CCM clinical pharmacist received incoming call from patient today regarding Synjardy medication assistance application. HIPAA identifiers verified.  Assessment Prepared applications for fax to [place] and [place]. [facility] (Dr. Gabriel Carina) and faxed provider portion for [facility], [facility], [facility] application. Noted patient did not provide prescription expenditure or EOB for [place] application.   Goals Addressed            This Visit's Progress   . I am having trouble affording some of my medicines (pt-stated)       Current Barriers:  . financial  Pharmacist Clinical Goal(s): Over the next 14 days, Dana Sparks will provide the necessary supplementary documents (proof of out of pocket prescription expenditure, proof of household income) needed for medication assistance applications to CCM pharmacist.   Interventions: . CCM pharmacist will apply for medication assistance program for Anoro Ellipta made by [place] and Synjardy XR made by [employer].  o Updated 03/18/19: patient has received applications but has no questions; reviewed necessary documents needed  Updated 03/28/19: patient is in the Medicare gap; completed Synjardy XR application together over the telephone; patient will return application and needed documents to the [place] office;  Updated 7/28: faxed Synjardy provider portion to Dr. Gabriel Carina; patient is missing prescription spend for [place] applications- need to contact patient; she verbalized EOB well on previous call, possible she just forgot  Patient Self Care Activities:  Marland Kitchen Gather necessary documents needed to apply for medication assistance  Please see past updates related to this goal by clicking on the "Past Updates" button in the selected goal           Telephone follow up appointment with care management team member scheduled for: within 3-5 days follow up with Dr. Gabriel Carina, patient   [name], PharmD Clinical Pharmacist [place] [phone]

## 2019-04-01 NOTE — Patient Instructions (Signed)
Goals Addressed            This Visit's Progress   . I am having trouble affording some of my medicines (pt-stated)       Current Barriers:  . financial  Pharmacist Clinical Goal(s): Over the next 14 days, [name] will provide the necessary supplementary documents (proof of out of pocket prescription expenditure, proof of household income) needed for medication assistance applications to CCM pharmacist.   Interventions: . CCM pharmacist will apply for medication assistance program for Anoro Ellipta made by [place] and Synjardy XR made by [employer].  o Updated 03/18/19: patient has received applications but has no questions; reviewed necessary documents needed  Updated 03/28/19: patient is in the Medicare gap; completed Synjardy XR application together over the telephone; patient will return application and needed documents to the [place] office;  Updated 7/28: faxed Synjardy provider portion to Dr. Gabriel Carina; patient is missing prescription spend for [place] applications- need to contact patient; she verbalized EOB well on previous call, possible she just forgot  Patient Self Care Activities:  Marland Kitchen Gather necessary documents needed to apply for medication assistance  Please see past updates related to this goal by clicking on the "Past Updates" button in the selected goal
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
## 2019-04-04 ENCOUNTER — Ambulatory Visit: Payer: Self-pay | Admitting: Pharmacist

## 2019-04-04 ENCOUNTER — Telehealth: Payer: Self-pay

## 2019-04-04 NOTE — Chronic Care Management (AMB) (Signed)
  Chronic Care Management   Note  04/04/2019 Name: Dana Sparks MRN: [id number] DOB: 06-02-1951  68 y.o. year old female working with CCM clinical pharmacist for medication assistance. Telephone outreach today because applications for [place] were missing pharmacy prescription spend/insurance EOB.   Was unable to reach patient via telephone today and have left HIPAA compliant voicemail asking patient to return my call. (unsuccessful outreach #1).  Follow up plan: The care management team will reach out to the patient again over the next 3-5 days.   [name], PharmD Clinical Pharmacist [facility] [phone]

## 2019-04-08 ENCOUNTER — Ambulatory Visit: Payer: Self-pay | Admitting: Pharmacist

## 2019-04-08 DIAGNOSIS — E1143 Type 2 diabetes mellitus with diabetic autonomic (poly)neuropathy: Secondary | ICD-10-CM

## 2019-04-08 NOTE — Chronic Care Management (AMB) (Signed)
  Chronic Care Management   Note  04/08/2019 Name: Dana Sparks MRN: [id number] DOB: Jan 10, 1951  Received provider portion of [facility] application for Synjardy XR. Faxed application to [employer] and uploaded to media tab.   Goals Addressed            This Visit's Progress   . I am having trouble affording some of my medicines (pt-stated)       Current Barriers:  . financial  Pharmacist Clinical Goal(s): Over the next 14 days, Dana Sparks will provide the necessary supplementary documents (proof of out of pocket prescription expenditure, proof of household income) needed for medication assistance applications to CCM pharmacist.   Interventions: . CCM pharmacist will apply for medication assistance program for Anoro Ellipta made by [place] and Synjardy XR made by [employer].  o Updated 03/18/19: patient has received applications but has no questions; reviewed necessary documents needed  Updated 03/28/19: patient is in the Medicare gap; completed Synjardy XR application together over the telephone; patient will return application and needed documents to the [place] office;  Updated 7/28: faxed Synjardy provider portion to Dr. Gabriel Carina; patient is missing prescription spend for [place] applications- need to contact patient; Dana Sparks verbalized EOB well on previous call, possible Dana Sparks just forgot Updated 8/4: Received provider portion from Dr. Gabriel Carina, Faxed application to [place]  Patient Self Care Activities:  Marland Kitchen Gather necessary documents needed to apply for medication assistance  Please see past updates related to this goal by clicking on the "Past Updates" button in the selected goal          Follow up plan: Telephone follow up appointment with care management team member scheduled for: 7 days with PharmD  [name], PharmD Clinical Pharmacist [place] [facility] [phone]

## 2019-04-15 ENCOUNTER — Ambulatory Visit: Payer: Self-pay | Admitting: Pharmacist

## 2019-04-15 DIAGNOSIS — E1143 Type 2 diabetes mellitus with diabetic autonomic (poly)neuropathy: Secondary | ICD-10-CM

## 2019-04-15 NOTE — Chronic Care Management (AMB) (Signed)
  Chronic Care Management   Follow Up Note   04/15/2019 Name: LAYTON TAPPAN MRN: [id number] DOB: 02-04-1951  Subjective NETA UPADHYAY is a 68 y.o. year old female who is a primary care patient of Steele Sizer, MD. The CCM team was consulted for assistance with chronic disease management and care coordination needs.    Assessment Medication Assistance: Patient is has been approved for Synjardy XR made by [facility] and prescribed by Dr Gabriel Carina. Approved 04/09/19 and expires 09/04/19. Approval letter uploaded to media tab.  Patient should receive medications via mail in 7-10 business days. Will follow up with patient in 7 business days to confirm patient has received medications and to explain any applicable refill process.     Goals Addressed            This Visit's Progress   . COMPLETED: I am having trouble affording some of my medicines (pt-stated)       Current Barriers:  . financial  Pharmacist Clinical Goal(s): Over the next 14 days, [name] will provide the necessary supplementary documents (proof of out of pocket prescription expenditure, proof of household income) needed for medication assistance applications to CCM pharmacist.   Interventions: . CCM pharmacist will apply for medication assistance program for Anoro Ellipta made by [place] and Synjardy XR made by [employer].  o Updated 03/18/19: patient has received applications but has no questions; reviewed necessary documents needed  Updated 03/28/19: patient is in the Medicare gap; completed Synjardy XR application together over the telephone; patient will return application and needed documents to the [place] office;  Updated 7/28: faxed Synjardy provider portion to Dr. Gabriel Carina; patient is missing prescription spend for [place] applications- need to contact patient; she verbalized EOB well on previous call, possible she just forgot Updated 8/4: Received provider portion from Dr. Gabriel Carina, Faxed application to [place] Updated 8/11:  approved  Patient Self Care Activities:  Marland Kitchen Gather necessary documents needed to apply for medication assistance  Please see past updates related to this goal by clicking on the "Past Updates" button in the selected goal          Telephone follow up appointment with care management team member scheduled for: 2 weeks with PhamD to assess Glyn Ade, PharmD Clinical Pharmacist [place] [facility] [phone]

## 2019-04-17 ENCOUNTER — Other Ambulatory Visit: Payer: Self-pay

## 2019-04-17 ENCOUNTER — Ambulatory Visit: Payer: Medicare HMO | Admitting: Family Medicine

## 2019-04-17 DIAGNOSIS — Z20822 Contact with and (suspected) exposure to covid-19: Secondary | ICD-10-CM

## 2019-04-18 LAB — NOVEL CORONAVIRUS, NAA: SARS-CoV-2, NAA: NOT DETECTED

## 2019-04-21 ENCOUNTER — Other Ambulatory Visit: Payer: Self-pay

## 2019-04-21 ENCOUNTER — Ambulatory Visit
Admission: RE | Admit: 2019-04-21 | Discharge: 2019-04-21 | Disposition: A | Discharge status: Home / Self Care | Payer: Medicare HMO | Source: Ambulatory Visit | Attending: Family Medicine | Admitting: Family Medicine

## 2019-04-21 DIAGNOSIS — G4733 Obstructive sleep apnea (adult) (pediatric): Secondary | ICD-10-CM | POA: Diagnosis not present

## 2019-04-21 DIAGNOSIS — Z1231 Encounter for screening mammogram for malignant neoplasm of breast: Secondary | ICD-10-CM | POA: Insufficient documentation

## 2019-04-29 ENCOUNTER — Ambulatory Visit (INDEPENDENT_AMBULATORY_CARE_PROVIDER_SITE_OTHER): Payer: Medicare HMO | Admitting: Psychiatry

## 2019-04-29 ENCOUNTER — Other Ambulatory Visit: Payer: Self-pay

## 2019-04-29 DIAGNOSIS — E1159 Type 2 diabetes mellitus with other circulatory complications: Secondary | ICD-10-CM | POA: Diagnosis not present

## 2019-04-29 DIAGNOSIS — F330 Major depressive disorder, recurrent, mild: Secondary | ICD-10-CM

## 2019-04-29 DIAGNOSIS — E1142 Type 2 diabetes mellitus with diabetic polyneuropathy: Secondary | ICD-10-CM | POA: Diagnosis not present

## 2019-05-06 ENCOUNTER — Encounter: Payer: Self-pay | Admitting: Family Medicine

## 2019-05-06 ENCOUNTER — Other Ambulatory Visit: Payer: Self-pay

## 2019-05-06 ENCOUNTER — Ambulatory Visit (INDEPENDENT_AMBULATORY_CARE_PROVIDER_SITE_OTHER): Payer: Medicare HMO | Admitting: Family Medicine

## 2019-05-06 VITALS — BP 112/68 | HR 85 | Temp 96.9°F | Resp 16 | Ht 63.5 in | Wt 226.8 lb

## 2019-05-06 DIAGNOSIS — Z23 Encounter for immunization: Secondary | ICD-10-CM

## 2019-05-06 DIAGNOSIS — E785 Hyperlipidemia, unspecified: Secondary | ICD-10-CM

## 2019-05-06 DIAGNOSIS — E1143 Type 2 diabetes mellitus with diabetic autonomic (poly)neuropathy: Secondary | ICD-10-CM

## 2019-05-06 DIAGNOSIS — K219 Gastro-esophageal reflux disease without esophagitis: Secondary | ICD-10-CM | POA: Diagnosis not present

## 2019-05-06 DIAGNOSIS — R0789 Other chest pain: Secondary | ICD-10-CM

## 2019-05-06 DIAGNOSIS — J302 Other seasonal allergic rhinitis: Secondary | ICD-10-CM

## 2019-05-06 DIAGNOSIS — I209 Angina pectoris, unspecified: Secondary | ICD-10-CM

## 2019-05-06 DIAGNOSIS — J410 Simple chronic bronchitis: Secondary | ICD-10-CM

## 2019-05-06 DIAGNOSIS — I10 Essential (primary) hypertension: Secondary | ICD-10-CM | POA: Diagnosis not present

## 2019-05-06 DIAGNOSIS — F330 Major depressive disorder, recurrent, mild: Secondary | ICD-10-CM | POA: Diagnosis not present

## 2019-05-06 DIAGNOSIS — J3089 Other allergic rhinitis: Secondary | ICD-10-CM

## 2019-05-06 MED ORDER — TIZANIDINE HCL 4 MG PO TABS: 4.0000 mg | ORAL_TABLET | Freq: Four times a day (QID) | ORAL | 0 refills | Status: DC | PRN

## 2019-05-06 MED ORDER — FLUTICASONE PROPIONATE 50 MCG/ACT NA SUSP: 2.0000 | Freq: Every day | NASAL | 2 refills | Status: DC

## 2019-05-06 MED ORDER — VENLAFAXINE HCL ER 75 MG PO CP24: ORAL_CAPSULE | ORAL | 1 refills | Status: DC

## 2019-05-06 MED ORDER — PANTOPRAZOLE SODIUM 40 MG PO TBEC: 40.0000 mg | DELAYED_RELEASE_TABLET | Freq: Every morning | ORAL | 1 refills | Status: DC

## 2019-05-06 MED ORDER — ROSUVASTATIN CALCIUM 20 MG PO TABS: 20.0000 mg | ORAL_TABLET | Freq: Every day | ORAL | 1 refills | Status: DC

## 2019-05-06 MED ORDER — LISINOPRIL 20 MG PO TABS: 20.0000 mg | ORAL_TABLET | Freq: Every day | ORAL | 1 refills | Status: DC

## 2019-05-06 MED ORDER — ISOSORBIDE MONONITRATE ER 60 MG PO TB24: 60.0000 mg | ORAL_TABLET | Freq: Every day | ORAL | 1 refills | Status: DC

## 2019-05-06 MED ORDER — MONTELUKAST SODIUM 10 MG PO TABS: 10.0000 mg | ORAL_TABLET | Freq: Every evening | ORAL | 1 refills | Status: DC

## 2019-05-06 MED ORDER — METOCLOPRAMIDE HCL 5 MG PO TABS: 5.0000 mg | ORAL_TABLET | Freq: Three times a day (TID) | ORAL | 0 refills | Status: DC
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
## 2019-05-07 ENCOUNTER — Ambulatory Visit: Payer: Self-pay | Admitting: Pharmacist

## 2019-05-07 ENCOUNTER — Telehealth: Payer: Self-pay | Admitting: Family Medicine

## 2019-05-07 DIAGNOSIS — E785 Hyperlipidemia, unspecified: Secondary | ICD-10-CM

## 2019-05-07 DIAGNOSIS — E1143 Type 2 diabetes mellitus with diabetic autonomic (poly)neuropathy: Secondary | ICD-10-CM

## 2019-05-07 DIAGNOSIS — I10 Essential (primary) hypertension: Secondary | ICD-10-CM

## 2019-05-07 NOTE — Telephone Encounter (Signed)
Returning patient's call.   After recent visit with Dr. Gabriel Carina, she will be switching insulin. Dr. Gabriel Carina wishes to collaborate on medication assistance program.   [name], PharmD Clinical Pharmacist [facility] [phone]

## 2019-05-07 NOTE — Telephone Encounter (Signed)
Copied from [place] [id number]. Topic: General - Other >> May 07, 2019  9:24 AM Mathis Bud wrote: Reason for CRM: Patient called to speak to [facility] regarding medication Patient call back [phone]

## 2019-05-08 ENCOUNTER — Ambulatory Visit (INDEPENDENT_AMBULATORY_CARE_PROVIDER_SITE_OTHER): Payer: Medicare HMO | Admitting: Pharmacist

## 2019-05-08 DIAGNOSIS — E1143 Type 2 diabetes mellitus with diabetic autonomic (poly)neuropathy: Secondary | ICD-10-CM

## 2019-05-08 NOTE — Chronic Care Management (AMB) (Signed)
  Chronic Care Management   Follow Up Note   05/08/2019 Name: Dana Sparks MRN: [id number] DOB: [birth year]  Subjective Dana Sparks is a 68 y.o. year old female who is a primary care patient of Steele Sizer, MD. The CCM clinical pharmacist received incoming call from patient today regarding medication assistance application as Dr. Gabriel Carina recently changed her insulin regimen. HIPAA identifiers verified.  Assessment Review of patient status, including review of consultants reports, relevant laboratory and other test results, and collaboration with appropriate care team members and the patient's provider was performed as part of comprehensive patient evaluation and provision of chronic care management services.    Patient states she has met the Medicare gap. Together we complete the Energy manager. Review necessary supplementary documents. Patient will drop off application at the [place] office by EOB 05/08/19  Goals Addressed            This Visit's Progress   . New Insulin- Basaglar (pt-stated)       Current Barriers:  Marland Kitchen Knowledge Deficits related to basic Diabetes pathophysiology and self care/management . Difficulty obtaining or cannot afford medications . Financial Constraints  Clinical Pharmacist Goal(s):  Over the next 90 days, patient will demonstrate improved adherence to prescribed treatment plan for diabetes self care/management as evidenced by:  Marland Kitchen Lowered A1c . Fasting BG <140 mg/dL  Over the next 14 days, patient will provided necessary documents to apply to medication assistance for Basaglar made by [employer] and prescribed by Dr. Gabriel Carina  Interventions:  . Facilitate application to [employer] for [employer] . Collaboration with Dr. Joycie Peek office  . Education surrounding pathophysiology of Engineer, agricultural . Patient educated on purpose, proper use and potential adverse effects of Basaglar.   .   Patient Self Care Activities:  . Self administers insulin as prescribed .  Checks blood sugars as prescribed and utilize hyper and hypoglycemia protocol as needed . Provide proper documents for medication assistance application  Initial goal documentation         Telephone follow up appointment with care management team member scheduled for: care coordination - PharmD to submit completed Basaglar application to Dr. Valda Favia office for submission  PharmD to follow up with patient for assessment of DM goals in 30 days   [name], PharmD Clinical Pharmacist [place] [phone]

## 2019-05-08 NOTE — Patient Instructions (Signed)
  Thank you allowing the Chronic Care Management Team to be a part of your care!   Goals Addressed            This Visit's Progress   . New Insulin- Basaglar (pt-stated)       Current Barriers:  Marland Kitchen Knowledge Deficits related to basic Diabetes pathophysiology and self care/management . Difficulty obtaining or cannot afford medications . Financial Constraints  Clinical Pharmacist Goal(s):  Over the next 90 days, patient will demonstrate improved adherence to prescribed treatment plan for diabetes self care/management as evidenced by:  Marland Kitchen Lowered A1c . Fasting BG <140 mg/dL  Over the next 14 days, patient will provided necessary documents to apply to medication assistance for Basaglar made by [employer] and prescribed by Dr. Gabriel Carina  Interventions:  . Facilitate application to [employer] for [employer] . Collaboration with Dr. Joycie Peek office  . Education surrounding pathophysiology of Engineer, agricultural . Patient educated on purpose, proper use and potential adverse effects of Basaglar.   .   Patient Self Care Activities:  . Self administers insulin as prescribed . Checks blood sugars as prescribed and utilize hyper and hypoglycemia protocol as needed . Provide proper documents for medication assistance application  Initial goal documentation

## 2019-05-09 ENCOUNTER — Ambulatory Visit: Payer: Medicare HMO | Admitting: Family Medicine

## 2019-05-13 NOTE — Patient Instructions (Signed)
Goals Addressed            This Visit's Progress   . New Insulin- Basaglar (pt-stated)       Current Barriers:  Marland Kitchen Knowledge Deficits related to basic Diabetes pathophysiology and self care/management . Difficulty obtaining or cannot afford medications . Financial Constraints  Clinical Pharmacist Goal(s):  Over the next 90 days, patient will demonstrate improved adherence to prescribed treatment plan for diabetes self care/management as evidenced by:  Marland Kitchen Lowered A1c . Fasting BG <140 mg/dL  Over the next 14 days, patient will provided necessary documents to apply to medication assistance for Basaglar made by Lilly and prescribed by Dr. Gabriel Carina Updated 9/3: Patient has provided docmentation and full application with supplemental documents have been faxed to Dr. Joycie Peek office, ATTN: Gordy Councilman. Uploaded copy into media tab  Interventions:  . Facilitate application to [employer] for [employer] . Collaboration with Dr. Joycie Peek office  o 9/3: Completed [employer] Cares application, provided full application and supplemental documents to Dr. Gabriel Carina . Education surrounding pathophysiology of Engineer, agricultural . Patient educated on purpose, proper use and potential adverse effects of Basaglar.   .   Patient Self Care Activities:  . Self administers insulin as prescribed . Checks blood sugars as prescribed and utilize hyper and hypoglycemia protocol as needed . Provide proper documents for medication assistance application  Please see past updates related to this goal by clicking on the "Past Updates" button in the selected goal

## 2019-05-13 NOTE — Chronic Care Management (AMB) (Signed)
  Chronic Care Management   Note  05/13/2019- Late Entry Name: Dana Sparks MRN: [id number] DOB: Aug 08, 1951  Dana Sparks is a 68 year old patient of Dr. Steele Sizer, engaged with CCM pharmacist for medication assistance. Patient completed portion of [employer] application today.    Goals Addressed            This Visit's Progress   . New Insulin- Basaglar (pt-stated)       Current Barriers:  Marland Kitchen Knowledge Deficits related to basic Diabetes pathophysiology and self care/management . Difficulty obtaining or cannot afford medications . Financial Constraints  Clinical Pharmacist Goal(s):  Over the next 90 days, patient will demonstrate improved adherence to prescribed treatment plan for diabetes self care/management as evidenced by:  Marland Kitchen Lowered A1c . Fasting BG <140 mg/dL  Over the next 14 days, patient will provided necessary documents to apply to medication assistance for Basaglar made by Lilly and prescribed by Dr. Gabriel Carina Updated 9/3: Patient has provided docmentation and full application with supplemental documents have been faxed to Dr. Joycie Peek office, ATTN: Gordy Councilman. Uploaded copy into media tab  Interventions:  . Facilitate application to [employer] for [employer] . Collaboration with Dr. Joycie Peek office  o 9/3: Completed [employer] Cares application, provided full application and supplemental documents to Dr. Gabriel Carina . Education surrounding pathophysiology of Engineer, agricultural . Patient educated on purpose, proper use and potential adverse effects of Basaglar.   .   Patient Self Care Activities:  . Self administers insulin as prescribed . Checks blood sugars as prescribed and utilize hyper and hypoglycemia protocol as needed . Provide proper documents for medication assistance application  Please see past updates related to this goal by clicking on the "Past Updates" button in the selected goal          Follow up plan: Telephone follow up appointment with care management team  member scheduled for: 30 days with PharmD for new insulin follow up  [name], PharmD Clinical Pharmacist [place] [phone]

## 2019-05-19 DIAGNOSIS — B351 Tinea unguium: Secondary | ICD-10-CM | POA: Diagnosis not present

## 2019-05-19 DIAGNOSIS — L851 Acquired keratosis [keratoderma] palmaris et plantaris: Secondary | ICD-10-CM | POA: Diagnosis not present

## 2019-05-19 DIAGNOSIS — E1142 Type 2 diabetes mellitus with diabetic polyneuropathy: Secondary | ICD-10-CM | POA: Diagnosis not present

## 2019-05-21 ENCOUNTER — Ambulatory Visit: Payer: Medicare HMO | Admitting: Licensed Clinical Social Worker

## 2019-06-02 ENCOUNTER — Telehealth: Payer: Self-pay

## 2019-06-02 NOTE — Telephone Encounter (Signed)
Copied from [place] [id number]. Topic: General - Inquiry >> Jun 02, 2019  1:30 PM Reyne Dumas L wrote: Reason for CRM:  Pt states that she is needing to speak with Almyra Free who will know what the call is about.  Pt would not leave additional information.

## 2019-06-03 ENCOUNTER — Ambulatory Visit: Payer: Self-pay | Admitting: Pharmacist

## 2019-06-04 DIAGNOSIS — Z961 Presence of intraocular lens: Secondary | ICD-10-CM | POA: Diagnosis not present

## 2019-06-05 NOTE — Chronic Care Management (AMB) (Signed)
  Chronic Care Management   Note  06/05/2019- late entry Name: Dana Sparks MRN: [id number] DOB: 05-10-1951  Returning patient's call as requested.   Was unable to reach patient via telephone today and have left HIPAA compliant voicemail asking patient to return my call. (unsuccessful outreach #1).  Follow up plan: A HIPPA compliant phone message was left for the patient providing contact information and requesting a return call.  The care management team will reach out to the patient again over the next 5 days.   [name], PharmD Clinical Pharmacist [facility] [phone]

## 2019-06-05 NOTE — Telephone Encounter (Signed)
Returned patient call. Left voicemail and call back information.   [name], PharmD Clinical Pharmacist [facility] [phone]

## 2019-06-06 ENCOUNTER — Ambulatory Visit: Payer: Self-pay | Admitting: Pharmacist

## 2019-06-06 DIAGNOSIS — E1143 Type 2 diabetes mellitus with diabetic autonomic (poly)neuropathy: Secondary | ICD-10-CM

## 2019-06-10 ENCOUNTER — Ambulatory Visit: Payer: Medicare HMO | Admitting: Pharmacist

## 2019-06-10 DIAGNOSIS — E1143 Type 2 diabetes mellitus with diabetic autonomic (poly)neuropathy: Secondary | ICD-10-CM

## 2019-06-11 ENCOUNTER — Other Ambulatory Visit: Payer: Self-pay | Admitting: Family Medicine

## 2019-06-11 DIAGNOSIS — B3731 Acute candidiasis of vulva and vagina: Secondary | ICD-10-CM

## 2019-06-11 DIAGNOSIS — B373 Candidiasis of vulva and vagina: Secondary | ICD-10-CM

## 2019-06-11 DIAGNOSIS — E1143 Type 2 diabetes mellitus with diabetic autonomic (poly)neuropathy: Secondary | ICD-10-CM

## 2019-06-11 MED ORDER — TIZANIDINE HCL 4 MG PO TABS: 4.0000 mg | ORAL_TABLET | Freq: Four times a day (QID) | ORAL | 0 refills | Status: DC | PRN

## 2019-06-11 MED ORDER — METOCLOPRAMIDE HCL 5 MG PO TABS: 5.0000 mg | ORAL_TABLET | Freq: Three times a day (TID) | ORAL | 0 refills | Status: DC

## 2019-06-11 MED ORDER — FLUCONAZOLE 150 MG PO TABS: 150.0000 mg | ORAL_TABLET | ORAL | 0 refills | Status: DC

## 2019-06-11 NOTE — Telephone Encounter (Signed)
Requested medication Metoclopramide and Tizanidine to [facility].

## 2019-06-11 NOTE — Telephone Encounter (Signed)
Pt has another yeast infection and is very uncomfortable and would like a refill for Boric Acid CRYS  Sent asap to  [facility], [place] [address] [place] [phone] (Phone) [phone] (Fax)

## 2019-06-12 NOTE — Chronic Care Management (AMB) (Signed)
  Chronic Care Management   Note  06/12/2019- Late entry Name: Dana Sparks MRN: [id number] DOB: Sep 18, 1950  Incoming call from patient, HIPAA identifiers verified.   Patient contacted [employer] and was told that her insurance information was missing from application. I contacted [employer] and provided all missing information and requested that patient's order be processed as urgent in the next 48 hours. Called patient back and instructed her that she should expect a call from [facility]'s mail order pharmacy, [facility] to schedule delivery.   Goals Addressed            This Visit's Progress   . New Insulin- Basaglar (pt-stated)       Current Barriers:  Marland Kitchen Knowledge Deficits related to basic Diabetes pathophysiology and self care/management . Difficulty obtaining or cannot afford medications . Financial Constraints  Clinical Pharmacist Goal(s):  Over the next 90 days, patient will demonstrate improved adherence to prescribed treatment plan for diabetes self care/management as evidenced by:  Marland Kitchen Lowered A1c . Fasting BG <140 mg/dL  Over the next 14 days, patient will provided necessary documents to apply to medication assistance for Basaglar made by Lilly and prescribed by Dr. Gabriel Carina Updated 9/3: Patient has provided docmentation and full application with supplemental documents have been faxed to Dr. Joycie Peek office, ATTN: Gordy Councilman. Uploaded copy into media tab Updated 10/2: Patient contacted pharmacist- has not received medication yet; pharmacist encouraged patient to call [employer] Cares at [phone] to review application status or schedule delivery with [facility]  Interventions:  . Facilitate application to [employer] for [employer] . Collaboration with Dr. Joycie Peek office  o 9/3: Completed [employer] Cares application, provided full application and supplemental documents to Dr. Gabriel Carina . Education surrounding pathophysiology of Engineer, agricultural . Patient educated on purpose, proper use  and potential adverse effects of Basaglar.   Marland Kitchen Updated 10/6: Patient contacted [employer], states that they are missing her insurance card; CCM pharmacist contacted [employer], provided insurance information and had patient's insulin order be processed as urgent  Patient Self Care Activities:  . Self administers insulin as prescribed . Checks blood sugars as prescribed and utilize hyper and hypoglycemia protocol as needed . Provide proper documents for medication assistance application  Please see past updates related to this goal by clicking on the "Past Updates" button in the selected goal          Follow up plan: Telephone follow up appointment with care management team member scheduled for: 7 days  [name], PharmD Clinical Pharmacist [facility] [phone]

## 2019-06-12 NOTE — Chronic Care Management (AMB) (Signed)
  Chronic Care Management   Note  06/12/2019- Late entry Name: Dana Sparks MRN: [id number] DOB: 07-Apr-1951  Incoming call from patient, HIPAA identifiers verified.   Patient still has not received Engineer, agricultural from [employer]. Provided patient with [employer] contact information so she can check the status of her application or schedule delivery with [facility]  Goals Addressed            This Visit's Progress   . New Insulin- Basaglar (pt-stated)       Current Barriers:  Marland Kitchen Knowledge Deficits related to basic Diabetes pathophysiology and self care/management . Difficulty obtaining or cannot afford medications . Financial Constraints  Clinical Pharmacist Goal(s):  Over the next 90 days, patient will demonstrate improved adherence to prescribed treatment plan for diabetes self care/management as evidenced by:  Marland Kitchen Lowered A1c . Fasting BG <140 mg/dL  Over the next 14 days, patient will provided necessary documents to apply to medication assistance for Basaglar made by Lilly and prescribed by Dr. Gabriel Carina Updated 9/3: Patient has provided docmentation and full application with supplemental documents have been faxed to Dr. Joycie Peek office, ATTN: Gordy Councilman. Uploaded copy into media tab Updated 10/2: Patient contacted pharmacist- has not received medication yet; pharmacist encouraged patient to call [employer] Cares at [phone] to review application status or schedule delivery with [facility]  Interventions:  . Facilitate application to [employer] for [employer] . Collaboration with Dr. Joycie Peek office  o 9/3: Completed [employer] Cares application, provided full application and supplemental documents to Dr. Gabriel Carina . Education surrounding pathophysiology of Engineer, agricultural . Patient educated on purpose, proper use and potential adverse effects of Basaglar.   .   Patient Self Care Activities:  . Self administers insulin as prescribed . Checks blood sugars as prescribed and utilize hyper and  hypoglycemia protocol as needed . Provide proper documents for medication assistance application  Please see past updates related to this goal by clicking on the "Past Updates" button in the selected goal          Follow up plan: Telephone follow up appointment with care management team member scheduled for: next Tuesday   [name], PharmD Clinical Pharmacist [facility] [phone]

## 2019-06-17 ENCOUNTER — Ambulatory Visit (INDEPENDENT_AMBULATORY_CARE_PROVIDER_SITE_OTHER): Payer: Medicare HMO | Admitting: Pharmacist

## 2019-06-17 DIAGNOSIS — E1143 Type 2 diabetes mellitus with diabetic autonomic (poly)neuropathy: Secondary | ICD-10-CM | POA: Diagnosis not present

## 2019-06-18 NOTE — Chronic Care Management (AMB) (Signed)
Chronic Care Management   Follow Up Note   06/18/2019 Name: Dana Sparks MRN: [id number] DOB: Apr 21, 1951  Subjective Dana Sparks is a 68 y.o. year old female who is a primary care patient of Steele Sizer, MD. The CCM team was consulted for assistance with chronic disease management and care coordination needs.    Assessment Review of patient status, including review of consultants reports, relevant laboratory and other test results, and collaboration with appropriate care team members and the patient's provider was performed as part of comprehensive patient evaluation and provision of chronic care management services.    SDOH (Social Determinants of Health) screening performed today: None. See Care Plan for related entries.   Advanced Directives Status: N See Care Plan and [employer] application for related entries.  Outpatient Encounter Medications as of 06/17/2019  Medication Sig Note  . ACCU-CHEK FASTCLIX LANCETS MISC    . ACCU-CHEK SMARTVIEW test strip Check fsbs two times daily  DMII   . Alcohol Swabs (B-D SINGLE USE SWABS REGULAR) PADS 1 each by Does not apply route 4 (four) times daily.   Marland Kitchen allopurinol (ZYLOPRIM) 100 MG tablet    . amLODipine (NORVASC) 5 MG tablet Take 5 mg by mouth daily.   Marland Kitchen aspirin EC 81 MG tablet Take 81 mg by mouth every morning.    . BD INSULIN SYRINGE U/F 31G X 5/16" 0.3 ML MISC    . blood glucose meter kit and supplies KIT Dispense based on patient and insurance preference. Use up to four times daily as directed. (FOR ICD-9 250.00, 250.01).   . Boric Acid CRYS Place 600 mg vaginally at bedtime. 03/24/2019: Hasnt been able to get from pharmacy so not started yet  . Cholecalciferol (VITAMIN D-3 PO) Take 1,000 Units by mouth 2 (two) times a day.   . cloNIDine (CATAPRES) 0.1 MG tablet TAKE 1 TABLET EVERY EVENING (START TAKING 1 TABLET AT BEDTIME FOR SWEATING, MAY INCREASE TO TWICE DAILY AS TOLERATED)   . Coenzyme Q10 100 MG capsule TAKE 1 CAPSULE EVERY DAY    . cyclobenzaprine (FLEXERIL) 5 MG tablet Take 1 tablet (5 mg total) by mouth 3 (three) times daily as needed for muscle spasms.   . DUREZOL 0.05 % EMUL    . Estradiol (ESTRACE VA) Place vaginally as needed.   . Ferrous Sulfate (IRON) 325 (65 Fe) MG TABS Take 325 mg by mouth 2 (two) times daily. Petra Kuba made brand-  Pt takes one tablet twice a day    . fluconazole (DIFLUCAN) 150 MG tablet Take 1 tablet (150 mg total) by mouth every other day.   . fluticasone (FLONASE) 50 MCG/ACT nasal spray Place 2 sprays into both nostrils daily.   Marland Kitchen glipiZIDE (GLUCOTROL XL) 5 MG 24 hr tablet Take 1 tablet by mouth daily. 03/24/2019: Per Dr. Wynelle Cleveland, anesthesia, pt to take ALL am meds   . hydrOXYzine (ATARAX/VISTARIL) 10 MG tablet Take 1 tablet (10 mg total) by mouth 3 (three) times daily as needed.   . insulin NPH Human (HUMULIN N,NOVOLIN N) 100 UNIT/ML injection Inject 25-30 Units into the skin 2 (two) times daily before a meal. 25 units in the morning and 30 units at bedtime   . ipratropium (ATROVENT) 0.03 % nasal spray Place 2 sprays into both nostrils every 12 (twelve) hours.   . isosorbide mononitrate (IMDUR) 60 MG 24 hr tablet Take 1 tablet (60 mg total) by mouth daily.   Marland Kitchen lisinopril (ZESTRIL) 20 MG tablet Take 1 tablet (20 mg total)  by mouth daily.   Marland Kitchen loratadine (CLARITIN) 10 MG tablet Take 1 tablet (10 mg total) by mouth daily.   . meloxicam (MOBIC) 7.5 MG tablet Take by mouth. 02/06/2019: PRN.   . metoCLOPramide (REGLAN) 5 MG tablet Take 1-2 tablets (5-10 mg total) by mouth 4 (four) times daily -  before meals and at bedtime.   . metoprolol succinate (TOPROL-XL) 50 MG 24 hr tablet Take 1 tablet (50 mg total) by mouth daily.   . montelukast (SINGULAIR) 10 MG tablet Take 1 tablet (10 mg total) by mouth every evening.   . pantoprazole (PROTONIX) 40 MG tablet Take 1 tablet (40 mg total) by mouth every morning.   . rosuvastatin (CRESTOR) 20 MG tablet Take 1 tablet (20 mg total) by mouth daily.   Marland Kitchen SYNJARDY XR  12.01-999 MG TB24 Take 2 tablets by mouth daily.   Marland Kitchen tiZANidine (ZANAFLEX) 4 MG tablet Take 1 tablet (4 mg total) by mouth every 6 (six) hours as needed for muscle spasms.   Marland Kitchen triamcinolone cream (KENALOG) 0.1 % Apply 1 application topically 2 (two) times daily.   Marland Kitchen umeclidinium-vilanterol (ANORO ELLIPTA) 62.5-25 MCG/INH AEPB Inhale 1 puff into the lungs daily. 02/19/2019: expensive  . venlafaxine XR (EFFEXOR-XR) 75 MG 24 hr capsule Take one tablet a day.    No facility-administered encounter medications on file as of 06/17/2019.      Goals Addressed            This Visit's Progress   . New Insulin- Basaglar (pt-stated)       Current Barriers:  Marland Kitchen Knowledge Deficits related to basic Diabetes pathophysiology and self care/management . Difficulty obtaining or cannot afford medications . Financial Constraints  Clinical Pharmacist Goal(s):  Over the next 90 days, patient will demonstrate improved adherence to prescribed treatment plan for diabetes self care/management as evidenced by:  Marland Kitchen Lowered A1c . Fasting BG <140 mg/dL  Over the next 14 days, patient will provided necessary documents to apply to medication assistance for Basaglar made by [employer] and prescribed by Dr. Gabriel Carina Updated 9/3: Patient has provided docmentation and full application with supplemental documents have been faxed to Dr. Joycie Peek office, ATTN: Gordy Councilman. Uploaded copy into media tab Updated 10/2: Patient contacted pharmacist- has not received medication yet; pharmacist encouraged patient to call [employer] Cares at [phone] to review application status or schedule delivery with [facility]  Interventions:  . Facilitate application to [employer] for [employer] . Collaboration with Dr. Joycie Peek office  o 9/3: Completed [employer] C[employer] application, provided full application and supplemental documents to Dr. Gabriel Carina . Education surrounding pathophysiology of Engineer, agricultural . Patient educated on purpose, proper use and potential  adverse effects of Basaglar.   Marland Kitchen Updated 10/6: Patient contacted [employer], states that they are missing her insurance card; CCM pharmacist contacted [employer], provided insurance information and had patient's insulin order be processed as urgent . Updated 10/13: patient has schduled insulin delivery but has not received it yet- unable to review BG and DM choices as patient has not initiated new Basaglar treatment  Patient Self Care Activities:  . Self administers insulin as prescribed . Checks blood sugars as prescribed and utilize hyper and hypoglycemia protocol as needed . Provide proper documents for medication assistance application  Please see past updates related to this goal by clicking on the "Past Updates" button in the selected goal         Follow up Telephone follow up appointment with care management team member scheduled for:  1-2 weeks with PharmD  [name], PharmD Clinical Pharmacist [facility] [phone]

## 2019-06-23 ENCOUNTER — Ambulatory Visit (INDEPENDENT_AMBULATORY_CARE_PROVIDER_SITE_OTHER): Payer: Medicare HMO | Admitting: Vascular Surgery

## 2019-06-23 ENCOUNTER — Encounter (INDEPENDENT_AMBULATORY_CARE_PROVIDER_SITE_OTHER): Payer: Medicare HMO

## 2019-06-24 ENCOUNTER — Other Ambulatory Visit: Payer: Self-pay | Admitting: Family Medicine

## 2019-06-24 DIAGNOSIS — E1143 Type 2 diabetes mellitus with diabetic autonomic (poly)neuropathy: Secondary | ICD-10-CM

## 2019-06-25 ENCOUNTER — Ambulatory Visit: Payer: Self-pay | Admitting: Pharmacist

## 2019-06-25 DIAGNOSIS — J410 Simple chronic bronchitis: Secondary | ICD-10-CM

## 2019-06-27 NOTE — Chronic Care Management (AMB) (Signed)
  Chronic Care Management   Note  06/27/2019- late entry Name: ALYESKA BOYETTE MRN: [id number] DOB: Dec 19, 1950  Valene Loubier is a 68 year old patient of Dr. Steele Sizer, engaged with CCM clinical pharmacist for medication assistance and medication management. Successful telephone outreach to patient. HIPAA identifiers verified.   Patient inquires whether purchase of eyeglasses would contribute to $600 OOP minimum for [employer] med assistance application.   Goals Addressed            This Visit's Progress   . I am having trouble affording some of my medicines (pt-stated)       Current Barriers:  . financial  Pharmacist Clinical Goal(s): Over the next 14 days, [name] will provide the necessary supplementary documents (proof of out of pocket prescription expenditure, proof of household income) needed for medication assistance applications to CCM pharmacist.   Interventions: . CCM pharmacist will apply for medication assistance program for Anoro Ellipta made by [employer]  o Updated 10/21: patient inquiring whether eyeglasses purchase contributes to $600 OOP minimum for [place]  Patient Self Care Activities:  Marland Kitchen Gather necessary documents needed to apply for medication assistance  Please see past updates related to this goal by clicking on the "Past Updates" button in the selected goal          Follow up plan: Telephone follow up appointment with care management team member scheduled for: 1 week  [name], PharmD Clinical Pharmacist [facility] [phone]

## 2019-07-01 ENCOUNTER — Encounter: Payer: Self-pay | Admitting: Pharmacist

## 2019-07-15 ENCOUNTER — Ambulatory Visit: Payer: Self-pay | Admitting: Pharmacist

## 2019-07-15 DIAGNOSIS — E1143 Type 2 diabetes mellitus with diabetic autonomic (poly)neuropathy: Secondary | ICD-10-CM

## 2019-07-18 ENCOUNTER — Ambulatory Visit: Payer: Medicare HMO | Admitting: Family Medicine

## 2019-07-23 DIAGNOSIS — L851 Acquired keratosis [keratoderma] palmaris et plantaris: Secondary | ICD-10-CM | POA: Diagnosis not present

## 2019-07-23 DIAGNOSIS — E1142 Type 2 diabetes mellitus with diabetic polyneuropathy: Secondary | ICD-10-CM | POA: Diagnosis not present

## 2019-07-23 DIAGNOSIS — B351 Tinea unguium: Secondary | ICD-10-CM | POA: Diagnosis not present

## 2019-07-24 ENCOUNTER — Encounter: Payer: Self-pay | Admitting: Family Medicine

## 2019-07-24 ENCOUNTER — Other Ambulatory Visit: Payer: Self-pay

## 2019-07-24 ENCOUNTER — Ambulatory Visit (INDEPENDENT_AMBULATORY_CARE_PROVIDER_SITE_OTHER): Payer: Medicare HMO | Admitting: Family Medicine

## 2019-07-24 DIAGNOSIS — F330 Major depressive disorder, recurrent, mild: Secondary | ICD-10-CM

## 2019-07-24 DIAGNOSIS — I272 Pulmonary hypertension, unspecified: Secondary | ICD-10-CM

## 2019-07-24 DIAGNOSIS — G4733 Obstructive sleep apnea (adult) (pediatric): Secondary | ICD-10-CM

## 2019-07-24 DIAGNOSIS — E1143 Type 2 diabetes mellitus with diabetic autonomic (poly)neuropathy: Secondary | ICD-10-CM

## 2019-07-24 DIAGNOSIS — I209 Angina pectoris, unspecified: Secondary | ICD-10-CM | POA: Diagnosis not present

## 2019-07-24 DIAGNOSIS — I10 Essential (primary) hypertension: Secondary | ICD-10-CM | POA: Diagnosis not present

## 2019-07-24 DIAGNOSIS — J410 Simple chronic bronchitis: Secondary | ICD-10-CM | POA: Diagnosis not present

## 2019-07-24 DIAGNOSIS — K219 Gastro-esophageal reflux disease without esophagitis: Secondary | ICD-10-CM | POA: Diagnosis not present

## 2019-07-24 MED ORDER — METOCLOPRAMIDE HCL 5 MG PO TABS: ORAL_TABLET | ORAL | 0 refills | Status: DC

## 2019-07-24 MED ORDER — METOPROLOL SUCCINATE ER 50 MG PO TB24: 50.0000 mg | ORAL_TABLET | Freq: Every day | ORAL | 3 refills | Status: DC

## 2019-07-24 MED ORDER — AMLODIPINE BESYLATE 5 MG PO TABS: 5.0000 mg | ORAL_TABLET | Freq: Every day | ORAL | 1 refills | Status: DC

## 2019-07-24 NOTE — Progress Notes (Signed)
Name: Dana Sparks   MRN: [id number]    DOB: 1950/11/01   Date:07/24/2019       Progress Note  Subjective  Chief Complaint  Chief Complaint  Patient presents with   Diabetes   Hypertension   Depression   Dyslipidemia    I connected with  [name] on 07/24/19 at  7:40 AM EST by telephone and verified that I am speaking with the correct person using two identifiers.  I discussed the limitations, risks, security and privacy concerns of performing an evaluation and management service by telephone and the availability of in person appointments. Staff also discussed with the patient that there may be a patient responsible charge related to this service. Patient Location: at home Provider Location: [facility]   HPI  DMII with gastroparesis/CKI/neuropathy: she is taking medication as prescribed, seeing Dr. Gabriel Carina.Her hgbA1C has gone up from  7.8% to 8.2%, she states she was transitioning from NPH to [employer]. She also takes Synjardi and Glipizide. She states fasting glucose has been well controlled 98-114, no hypoglycemic episodes. She has intermittent  polyphagia, polyuria or polydipsia.   HTN: she has been compliant with medication, no dizziness, chest pain or palpitation   Angina: CAD, her cardiologist is Dr. Clayborn Bigness,  she denies any recent chest pain.She is also on statin therapy, aspirin , beta blocker and ace. She is on disability for it for many years, but now on social security.Still sees Dr. Clayborn Bigness, reviewed records and Echo from 2016 showed decreased in EF 25-30% and global hypokines.She has some SOB with activity, she does not have orthopnea.   OSA: she is using a CPAP machine most night, she needs to resume it every night. Shehas mild pulmonary hypertension. She needs a new rx.for another machine  Major Depression Mild: she is doing well on Effexor again, she was off medication and had severe emotional liability, back on medication for  a few weeks and denies crying spellsphq 9 is normal today but she states she wants to talk to me in person during her next visit , phq9   Morbid obesity:she is trying to eat healthy,she is not going to the gym because of COVID-19 but has been active at home, she has been busy picking up leaves in her yard, she states walking very little because she has been lazy   Hyperlipidemia: taking statin therapy, no muscle problems  Chronic bronchitis: spirometry today showed normal Fev1/FVC ration at 94% she used to smoke 1 pack daily for 50 years, quit smoking in 2014 but still has daily morning cough that is very seldom now, sob only with moderate activity, no wheezing lately . Currently taking Anoro but too expensive, chronic care management is trying to find another inhaler that is more affordable for her .   Hypercalcemia: we will recheck level on her next visit  Patient Active Problem List   Diagnosis Date Noted   Disease of stomach    Hx of colonic polyps    Hepatomegaly 07/23/2018   Fatty liver 07/23/2018   Aortic atherosclerosis ([place]) 07/09/2018   Abnormal endocrine laboratory test finding 06/30/2018   Type II or unspecified type diabetes mellitus with neurological manifestations, not stated as uncontrolled(250.60) 09/03/2017   Nasal polyp 09/03/2017   Lipoma of back 09/03/2017   Claudication ([place]) 06/04/2017   LVH (left ventricular hypertrophy) 02/27/2017   Decreased cardiac ejection fraction 02/27/2017   Left hand weakness 10/12/2016   Elevated antinuclear antibody (ANA) level 04/27/2016   Angina  pectoris ([place]) 11/01/2015   Well controlled type 2 diabetes mellitus with gastroparesis ([place]) 06/22/2015   Low TSH level 06/22/2015   Iron deficiency anemia 04/07/2015   Intestinal metaplasia of gastric mucosa 03/11/2015   CAD (coronary artery disease), native coronary artery 02/24/2015   History of coronary artery stent placement 02/24/2015   Chronic kidney  disease, stage 3, mod decreased GFR 02/24/2015   Menopause 24/23/5361   History of Helicobacter pylori infection 02/24/2015   Mild mitral insufficiency 02/24/2015   Mild tricuspid insufficiency 02/24/2015   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 ([place]) 02/24/2015   Controlled gout 02/24/2015   Depression, major, recurrent, mild ([place]) 02/24/2015   Morbid obesity due to excess calories ([place]) 02/24/2015   Mild pulmonary hypertension ([place]) 02/24/2015   Gastroesophageal reflux disease without esophagitis 02/24/2015   Perennial allergic rhinitis 02/24/2015   HLD (hyperlipidemia) 02/20/2015   Hypertension, benign 02/20/2015   Apnea, sleep 02/20/2015   Controlled diabetes mellitus with stage 3 chronic kidney disease, without long-term current use of insulin ([place]) 02/20/2015    Past Surgical History:  Procedure Laterality Date   ABDOMINAL HYSTERECTOMY     total   CATARACT EXTRACTION     CATARACT EXTRACTION W/PHACO Left 03/24/2019   Procedure: CATARACT EXTRACTION PHACO AND INTRAOCULAR LENS PLACEMENT ([place])  LEFT DIABETIC;  Surgeon: Eulogio Bear, MD;  Location: [place];  Service: Ophthalmology;  Laterality: Left;  Diabetic - insulin and oral meds sleep apnea   COLONOSCOPY  01/2014   sigmoid diverticulosis. desc colon TA, hyperplastic polyp   COLONOSCOPY WITH PROPOFOL N/A 09/02/2018   Procedure: COLONOSCOPY WITH PROPOFOL;  Surgeon: Lucilla Lame, MD;  Location: [place];  Service: Endoscopy;  Laterality: N/A;   CORONARY ANGIOPLASTY WITH STENT PLACEMENT     ESOPHAGOGASTRODUODENOSCOPY N/A 02/16/2015   negative h pylori, focal gastic intestinal metaplasia   ESOPHAGOGASTRODUODENOSCOPY (EGD) WITH PROPOFOL N/A 09/02/2018   Procedure: ESOPHAGOGASTRODUODENOSCOPY (EGD) WITH BIOPSIES;  Surgeon: Lucilla Lame, MD;  Location: [place];  Service: Endoscopy;  Laterality: N/A;  diabetic -insulin and oral meds sleep apnea    TOTAL KNEE ARTHROPLASTY Right     Family History  Problem Relation Age of Onset   Stroke Sister    Hyperlipidemia Sister    Alcohol abuse Brother    Diabetes Brother    Stroke Brother    Hypertension Mother    Diabetes Mother    Heart disease Mother    Diabetes Father    Heart disease Father    Hypertension Father    Hypertension Sister    Cancer Maternal Grandmother        Unsure   Diabetes Maternal Grandfather    Colon cancer Neg Hx    Liver disease Neg Hx    Breast cancer Neg Hx     Social History   Socioeconomic History   Marital status: Married    Spouse name: Fritz Pickerel   Number of children: 2   Years of education: some college   Highest education level: 12th grade  Occupational History   Occupation: Disabled  Scientist, product/process development strain: Hard   Food insecurity    Worry: Never true    Inability: Never true   Transportation needs    Medical: No    Non-medical: No  Tobacco Use   Smoking status: Former Smoker    Packs/day: 1.00    Years: 40.00    Pack years: 40.00    Types: Cigarettes    Start  date: 09/04/1972    Quit date: 12/30/2012    Years since quitting: 6.5   Smokeless tobacco: Never Used   Tobacco comment: smoking cessation materials not required  Substance and Sexual Activity   Alcohol use: No    Alcohol/week: 0.0 standard drinks   Drug use: No   Sexual activity: Not Currently  Lifestyle   Physical activity    Days per week: 5 days    Minutes per session: 120 min   Stress: Not at all  Relationships   Social connections    Talks on phone: More than three times a week    Gets together: More than three times a week    Attends religious service: More than 4 times per year    Active member of club or organization: Yes    Attends meetings of clubs or organizations: More than 4 times per year    Relationship status: Married   Intimate partner violence    Fear of current or ex partner: No     Emotionally abused: No    Physically abused: No    Forced sexual activity: No  Other Topics Concern   Not on file  Social History Narrative   Married - current 3rd,    Two children from first marriage   He has 4 children from previous marriage     Current Outpatient Medications:    ACCU-CHEK FASTCLIX LANCETS MISC, , Disp: , Rfl:    ACCU-CHEK SMARTVIEW test strip, Check fsbs two times daily  DMII, Disp: 100 each, Rfl: 6   Alcohol Swabs (B-D SINGLE USE SWABS REGULAR) PADS, 1 each by Does not apply route 4 (four) times daily., Disp: 200 each, Rfl: 2   allopurinol (ZYLOPRIM) 100 MG tablet, , Disp: , Rfl:    amLODipine (NORVASC) 5 MG tablet, Take 5 mg by mouth daily., Disp: , Rfl:    aspirin EC 81 MG tablet, Take 81 mg by mouth every morning. , Disp: , Rfl:    BD INSULIN SYRINGE U/F 31G X 5/16" 0.3 ML MISC, , Disp: , Rfl:    blood glucose meter kit and supplies KIT, Dispense based on patient and insurance preference. Use up to four times daily as directed. (FOR ICD-9 250.00, 250.01)., Disp: 1 each, Rfl: 0   Boric Acid CRYS, Place 600 mg vaginally at bedtime., Disp: 12 g, Rfl: 0   Cholecalciferol (VITAMIN D-3 PO), Take 1,000 Units by mouth 2 (two) times a day., Disp: , Rfl:    cloNIDine (CATAPRES) 0.1 MG tablet, TAKE 1 TABLET EVERY EVENING (START TAKING 1 TABLET AT BEDTIME FOR SWEATING, MAY INCREASE TO TWICE DAILY AS TOLERATED), Disp: 180 tablet, Rfl: 1   Coenzyme Q10 100 MG capsule, TAKE 1 CAPSULE EVERY DAY, Disp: 90 capsule, Rfl: 1   cyclobenzaprine (FLEXERIL) 5 MG tablet, Take 1 tablet (5 mg total) by mouth 3 (three) times daily as needed for muscle spasms., Disp: 8 tablet, Rfl: 0   DENTA 5000 PLUS 1.1 % CREA dental cream, , Disp: , Rfl:    DROPLET PEN NEEDLES 31G X 5 MM MISC, , Disp: , Rfl:    DUREZOL 0.05 % EMUL, , Disp: , Rfl:    Estradiol (ESTRACE VA), Place vaginally as needed., Disp: , Rfl:    Ferrous Sulfate (IRON) 325 (65 Fe) MG TABS, Take 325 mg by mouth 2 (two)  times daily. [employer] made brand-  Pt takes one tablet twice a day , Disp: , Rfl:    fluticasone (FLONASE) 50 MCG/ACT  nasal spray, Place 2 sprays into both nostrils daily., Disp: 48 g, Rfl: 2   glipiZIDE (GLUCOTROL XL) 5 MG 24 hr tablet, Take 1 tablet by mouth daily., Disp: , Rfl:    hydrOXYzine (ATARAX/VISTARIL) 10 MG tablet, Take 1 tablet (10 mg total) by mouth 3 (three) times daily as needed., Disp: 30 tablet, Rfl: 0   Insulin Glargine (BASAGLAR KWIKPEN) 100 UNIT/ML SOPN, Inject 60 Units into the skin at bedtime., Disp: , Rfl:    Insulin Pen Needle (FIFTY50 PEN NEEDLES) 31G X 5 MM MISC, Use once daily, Disp: , Rfl:    ipratropium (ATROVENT) 0.03 % nasal spray, Place 2 sprays into both nostrils every 12 (twelve) hours., Disp: 1 mL, Rfl: 0   isosorbide mononitrate (IMDUR) 60 MG 24 hr tablet, Take 1 tablet (60 mg total) by mouth daily., Disp: 90 tablet, Rfl: 1   lisinopril (ZESTRIL) 20 MG tablet, Take 1 tablet (20 mg total) by mouth daily., Disp: 90 tablet, Rfl: 1   loratadine (CLARITIN) 10 MG tablet, Take 1 tablet (10 mg total) by mouth daily., Disp: 30 tablet, Rfl: 3   meloxicam (MOBIC) 7.5 MG tablet, Take by mouth., Disp: , Rfl:    metoCLOPramide (REGLAN) 5 MG tablet, TAKE 1-2 TABLETS (5-10 MG TOTAL) FOUR TIMES DAILY   BEFORE MEALS AND AT BEDTIME., Disp: 120 tablet, Rfl: 0   metoprolol succinate (TOPROL-XL) 50 MG 24 hr tablet, Take 1 tablet (50 mg total) by mouth daily., Disp: 90 tablet, Rfl: 3   montelukast (SINGULAIR) 10 MG tablet, Take 1 tablet (10 mg total) by mouth every evening., Disp: 90 tablet, Rfl: 1   pantoprazole (PROTONIX) 40 MG tablet, Take 1 tablet (40 mg total) by mouth every morning., Disp: 90 tablet, Rfl: 1   rosuvastatin (CRESTOR) 20 MG tablet, Take 1 tablet (20 mg total) by mouth daily., Disp: 90 tablet, Rfl: 1   SYNJARDY XR 12.01-999 MG TB24, Take 2 tablets by mouth daily., Disp: , Rfl:    tiZANidine (ZANAFLEX) 4 MG tablet, Take 1 tablet (4 mg total) by mouth  every 6 (six) hours as needed for muscle spasms., Disp: 90 tablet, Rfl: 0   triamcinolone cream (KENALOG) 0.1 %, Apply 1 application topically 2 (two) times daily., Disp: 30 g, Rfl: 0   venlafaxine XR (EFFEXOR-XR) 75 MG 24 hr capsule, Take one tablet a day., Disp: 90 capsule, Rfl: 1   fluconazole (DIFLUCAN) 150 MG tablet, Take 1 tablet (150 mg total) by mouth every other day. (Patient not taking: Reported on 07/24/2019), Disp: 3 tablet, Rfl: 0   insulin NPH Human (HUMULIN N,NOVOLIN N) 100 UNIT/ML injection, Inject 25-30 Units into the skin 2 (two) times daily before a meal. 25 units in the morning and 30 units at bedtime, Disp: , Rfl:    umeclidinium-vilanterol (ANORO ELLIPTA) 62.5-25 MCG/INH AEPB, Inhale 1 puff into the lungs daily. (Patient not taking: Reported on 07/24/2019), Disp: 270 each, Rfl: 1  Allergies  Allergen Reactions   Codeine Other (See Comments)    Unknown reaction   Contrast Media [Iodinated Diagnostic Agents] Itching   Sulfa Antibiotics Itching    I personally reviewed active problem list, medication list, allergies, family history, social history, health maintenance with the patient/caregiver today.   ROS  Ten systems reviewed and is negative except as mentioned in HPI   Objective  Virtual encounter, vitals not obtained.  There is no height or weight on file to calculate BMI.  Physical Exam  Awake, alert and oriented  PHQ2/9: Depression screen  [facility] 2/9 07/24/2019 05/06/2019 03/17/2019 02/12/2019 02/06/2019  Decreased Interest 0 1 0 0 2  Down, Depressed, Hopeless 0 0 0 0 0  PHQ - 2 Score 0 1 0 0 2  Altered sleeping 0 0 0 0 0  Tired, decreased energy 0 1 0 0 1  Change in appetite 0 0 0 0 0  Feeling bad or failure about yourself  0 0 0 0 0  Trouble concentrating 0 0 0 0 0  Moving slowly or fidgety/restless 0 0 0 0 0  Suicidal thoughts 0 0 0 0 0  PHQ-9 Score 0 2 0 0 3  Difficult doing work/chores - - - Not difficult at all Not difficult at all  Some  recent data might be hidden   PHQ-2/9 Result is negative.    Fall Risk: Fall Risk  05/06/2019 03/17/2019 02/12/2019 02/06/2019 11/07/2018  Falls in the past year? 0 0 0 0 1  Number falls in past yr: 0 0 - 0 0  Injury with Fall? 0 0 - 0 0  Comment - - - - -  Risk for fall due to : - - - - -     Assessment & Plan  1. Hypertension, benign  - metoprolol succinate (TOPROL-XL) 50 MG 24 hr tablet; Take 1 tablet (50 mg total) by mouth daily.  Dispense: 90 tablet; Refill: 3 - amLODipine (NORVASC) 5 MG tablet; Take 1 tablet (5 mg total) by mouth daily.  Dispense: 90 tablet; Refill: 1  2. Diabetes mellitus with gastroparesis (HCC)  - metoCLOPramide (REGLAN) 5 MG tablet; TAKE 1-2 TABLETS (5-10 MG TOTAL) FOUR TIMES DAILY   BEFORE MEALS AND AT BEDTIME.  Dispense: 120 tablet; Refill: 0  3. Simple chronic bronchitis ([place])  Still has some Anoro at home  4. Angina pectoris [facility])  Medical management  5. Depression, major, recurrent, mild ([place])  phq 9 was negative but she said she is only okay and did not want to talk over the phone  6. Gastroesophageal reflux disease without esophagitis  Controlled   7. Mild pulmonary hypertension ([place])  On CPAP sees cardiologist   8. Obstructive sleep apnea syndrome  We will send an order  I discussed the assessment and treatment plan with the patient. The patient was provided an opportunity to ask questions and all were answered. The patient agreed with the plan and demonstrated an understanding of the instructions.   The patient was advised to call back or seek an in-person evaluation if the symptoms worsen or if the condition fails to improve as anticipated.  I provided 25  minutes of non-face-to-face time during this encounter.  Loistine Chance, MD

## 2019-07-29 NOTE — Chronic Care Management (AMB) (Signed)
Chronic Care Management   Follow Up Note   07/29/2019- late entry Name: DEZARAI PREW MRN: [id number] DOB: 11-24-1950  Subjective TAHLOR BERENGUER is a 68 y.o. year old female who is a primary care patient of Steele Sizer, MD. The CCM  Clinical pharmacist answered incoming call from Ms Hidrogo. HIPAA identifiers verified.    Review of patient status, including review of consultants reports, relevant laboratory and other test results, and collaboration with appropriate care team members and the patient's provider was performed as part of comprehensive patient evaluation and provision of chronic care management services.    Objective  Outpatient Encounter Medications as of 07/15/2019  Medication Sig Note  . ACCU-CHEK FASTCLIX LANCETS MISC    . ACCU-CHEK SMARTVIEW test strip Check fsbs two times daily  DMII   . Alcohol Swabs (B-D SINGLE USE SWABS REGULAR) PADS 1 each by Does not apply route 4 (four) times daily.   Marland Kitchen allopurinol (ZYLOPRIM) 100 MG tablet    . aspirin EC 81 MG tablet Take 81 mg by mouth every morning.    . BD INSULIN SYRINGE U/F 31G X 5/16" 0.3 ML MISC    . blood glucose meter kit and supplies KIT Dispense based on patient and insurance preference. Use up to four times daily as directed. (FOR ICD-9 250.00, 250.01).   . Cholecalciferol (VITAMIN D-3 PO) Take 1,000 Units by mouth 2 (two) times a day.   . cloNIDine (CATAPRES) 0.1 MG tablet TAKE 1 TABLET EVERY EVENING (START TAKING 1 TABLET AT BEDTIME FOR SWEATING, MAY INCREASE TO TWICE DAILY AS TOLERATED)   . Coenzyme Q10 100 MG capsule TAKE 1 CAPSULE EVERY DAY   . DUREZOL 0.05 % EMUL    . Estradiol (ESTRACE VA) Place vaginally as needed.   . Ferrous Sulfate (IRON) 325 (65 Fe) MG TABS Take 325 mg by mouth 2 (two) times daily. [employer] made brand-  Pt takes one tablet twice a day    . fluticasone (FLONASE) 50 MCG/ACT nasal spray Place 2 sprays into both nostrils daily.   Marland Kitchen glipiZIDE (GLUCOTROL XL) 5 MG 24 hr tablet Take 1 tablet by  mouth daily. 03/24/2019: Per Dr. Wynelle Cleveland, anesthesia, pt to take ALL am meds   . hydrOXYzine (ATARAX/VISTARIL) 10 MG tablet Take 1 tablet (10 mg total) by mouth 3 (three) times daily as needed.   . isosorbide mononitrate (IMDUR) 60 MG 24 hr tablet Take 1 tablet (60 mg total) by mouth daily.   Marland Kitchen lisinopril (ZESTRIL) 20 MG tablet Take 1 tablet (20 mg total) by mouth daily.   Marland Kitchen loratadine (CLARITIN) 10 MG tablet Take 1 tablet (10 mg total) by mouth daily.   . meloxicam (MOBIC) 7.5 MG tablet Take by mouth. 02/06/2019: PRN.   . montelukast (SINGULAIR) 10 MG tablet Take 1 tablet (10 mg total) by mouth every evening.   . pantoprazole (PROTONIX) 40 MG tablet Take 1 tablet (40 mg total) by mouth every morning.   . rosuvastatin (CRESTOR) 20 MG tablet Take 1 tablet (20 mg total) by mouth daily.   Marland Kitchen SYNJARDY XR 12.01-999 MG TB24 Take 2 tablets by mouth daily.   Marland Kitchen triamcinolone cream (KENALOG) 0.1 % Apply 1 application topically 2 (two) times daily.   Marland Kitchen umeclidinium-vilanterol (ANORO ELLIPTA) 62.5-25 MCG/INH AEPB Inhale 1 puff into the lungs daily. (Patient not taking: Reported on 07/24/2019) 02/19/2019: expensive  . venlafaxine XR (EFFEXOR-XR) 75 MG 24 hr capsule Take one tablet a day.   . [DISCONTINUED] amLODipine (NORVASC) 5 MG tablet  Take 5 mg by mouth daily.   . [DISCONTINUED] Boric Acid CRYS Place 600 mg vaginally at bedtime. 03/24/2019: Hasnt been able to get from pharmacy so not started yet  . [DISCONTINUED] cyclobenzaprine (FLEXERIL) 5 MG tablet Take 1 tablet (5 mg total) by mouth 3 (three) times daily as needed for muscle spasms.   . [DISCONTINUED] fluconazole (DIFLUCAN) 150 MG tablet Take 1 tablet (150 mg total) by mouth every other day. (Patient not taking: Reported on 07/24/2019)   . [DISCONTINUED] insulin NPH Human (HUMULIN N,NOVOLIN N) 100 UNIT/ML injection Inject 25-30 Units into the skin 2 (two) times daily before a meal. 25 units in the morning and 30 units at bedtime   . [DISCONTINUED] ipratropium  (ATROVENT) 0.03 % nasal spray Place 2 sprays into both nostrils every 12 (twelve) hours.   . [DISCONTINUED] metoCLOPramide (REGLAN) 5 MG tablet TAKE 1-2 TABLETS (5-10 MG TOTAL) FOUR TIMES DAILY   BEFORE MEALS AND AT BEDTIME.   . [DISCONTINUED] metoprolol succinate (TOPROL-XL) 50 MG 24 hr tablet Take 1 tablet (50 mg total) by mouth daily.   . [DISCONTINUED] tiZANidine (ZANAFLEX) 4 MG tablet Take 1 tablet (4 mg total) by mouth every 6 (six) hours as needed for muscle spasms.    No facility-administered encounter medications on file as of 07/15/2019.      Goals Addressed            This Visit's Progress   . I am having trouble affording some of my medicines (pt-stated)       Current Barriers:  . financial  Pharmacist Clinical Goal(s): Over the next 14 days, [name] will provide the necessary supplementary documents (proof of out of pocket prescription expenditure, proof of household income) needed for medication assistance applications to CCM pharmacist.   Interventions: . CCM pharmacist will apply for medication assistance program for Anoro Ellipta made by [place]  o Updated 10/21: patient inquiring whether eyeglasses purchase contributes to $600 OOP minimum for [place] o Updated 11/10: patient inquiring again about [employer]/Anoro program; we reviewed that she is unlikely to qualify but we can plan assistnace for 2021 for her diabetes medications; she will need to complete applications with Dr. Gabriel Carina of [place]  Patient Self Care Activities:  Marland Kitchen Gather necessary documents needed to apply for medication assistance  Please see past updates related to this goal by clicking on the "Past Updates" button in the selected goal          Plan: Recommendations for provider: none   Recommendations for patient: pharmacist will provide you with [place] application for 2072; please complete with Dr. Gabriel Carina as she is your diabetes prescriber  Follow up Telephone follow up appointment with  care management team member scheduled for: 4 weeks  [name], PharmD Clinical Pharmacist [facility] [phone]

## 2019-07-29 NOTE — Patient Instructions (Signed)
Goals Addressed            This Visit's Progress   . I am having trouble affording some of my medicines (pt-stated)       Current Barriers:  . financial  Pharmacist Clinical Goal(s): Over the next 14 days, [name] will provide the necessary supplementary documents (proof of out of pocket prescription expenditure, proof of household income) needed for medication assistance applications to CCM pharmacist.   Interventions: . CCM pharmacist will apply for medication assistance program for Anoro Ellipta made by [place]  o Updated 10/21: patient inquiring whether eyeglasses purchase contributes to $600 OOP minimum for [place] o Updated 11/10: patient inquiring again about [employer]/Anoro program; we reviewed that she is unlikely to qualify but we can plan assistnace for 2021 for her diabetes medications; she will need to complete applications with Dr. Gabriel Carina of [place]  Patient Self Care Activities:  Marland Kitchen Gather necessary documents needed to apply for medication assistance  Please see past updates related to this goal by clicking on the "Past Updates" button in the selected goal

## 2019-07-30 DIAGNOSIS — E1159 Type 2 diabetes mellitus with other circulatory complications: Secondary | ICD-10-CM | POA: Diagnosis not present

## 2019-08-05 ENCOUNTER — Telehealth: Payer: Self-pay | Admitting: Family Medicine

## 2019-08-05 DIAGNOSIS — Z794 Long term (current) use of insulin: Secondary | ICD-10-CM | POA: Diagnosis not present

## 2019-08-05 DIAGNOSIS — J410 Simple chronic bronchitis: Secondary | ICD-10-CM

## 2019-08-05 DIAGNOSIS — E1159 Type 2 diabetes mellitus with other circulatory complications: Secondary | ICD-10-CM | POA: Diagnosis not present

## 2019-08-05 DIAGNOSIS — I10 Essential (primary) hypertension: Secondary | ICD-10-CM | POA: Diagnosis not present

## 2019-08-05 DIAGNOSIS — E1165 Type 2 diabetes mellitus with hyperglycemia: Secondary | ICD-10-CM | POA: Diagnosis not present

## 2019-08-05 DIAGNOSIS — E1142 Type 2 diabetes mellitus with diabetic polyneuropathy: Secondary | ICD-10-CM | POA: Diagnosis not present

## 2019-08-05 NOTE — Telephone Encounter (Signed)
Pt came in asking for samples of Anoro . I did not see any samples but I gave her a coupon for a free trial. Do you want any samples?anoro

## 2019-08-05 NOTE — Telephone Encounter (Signed)
I had already looked. I ask if you want to get in samples. Please advise

## 2019-08-06 ENCOUNTER — Other Ambulatory Visit: Payer: Self-pay | Admitting: Family Medicine

## 2019-08-06 DIAGNOSIS — J410 Simple chronic bronchitis: Secondary | ICD-10-CM

## 2019-08-06 MED ORDER — ANORO ELLIPTA 62.5-25 MCG/INH IN AEPB: 1.0000 | INHALATION_SPRAY | Freq: Every day | RESPIRATORY_TRACT | 0 refills | Status: DC

## 2019-08-06 NOTE — Telephone Encounter (Signed)
Dana Sparks coming to bring samples for Anoro.

## 2019-08-06 NOTE — Telephone Encounter (Signed)
Gave patient a coupon for Anoro but will have to have a rx to go with that coupon before she can get it free per drug rep Alen Blew. Could the dr write one for her to take with the coupon?

## 2019-08-14 DIAGNOSIS — Z20828 Contact with and (suspected) exposure to other viral communicable diseases: Secondary | ICD-10-CM | POA: Diagnosis not present

## 2019-08-18 DIAGNOSIS — G4733 Obstructive sleep apnea (adult) (pediatric): Secondary | ICD-10-CM | POA: Diagnosis not present

## 2019-08-22 ENCOUNTER — Telehealth: Payer: Self-pay | Admitting: Family Medicine

## 2019-08-22 NOTE — Telephone Encounter (Signed)
Spoke with patient and informed her that Dr. Ancil Boozer has completed her Dana Sparks disability form and it will be at front desk.  I also informed patient that she needs to complete the bottom portion so that we can have a copy for her chart.  Patient stated that she come by on 08/25/2019 to complete.

## 2019-08-27 ENCOUNTER — Other Ambulatory Visit: Payer: Self-pay | Admitting: Family Medicine

## 2019-08-27 DIAGNOSIS — J410 Simple chronic bronchitis: Secondary | ICD-10-CM

## 2019-08-28 ENCOUNTER — Encounter: Payer: Medicare HMO | Admitting: Physician Assistant

## 2019-09-18 DIAGNOSIS — G4733 Obstructive sleep apnea (adult) (pediatric): Secondary | ICD-10-CM | POA: Diagnosis not present

## 2019-09-23 ENCOUNTER — Ambulatory Visit: Payer: Self-pay | Admitting: Pharmacist

## 2019-09-29 DIAGNOSIS — Z23 Encounter for immunization: Secondary | ICD-10-CM | POA: Diagnosis not present

## 2019-09-30 ENCOUNTER — Ambulatory Visit (INDEPENDENT_AMBULATORY_CARE_PROVIDER_SITE_OTHER): Payer: Medicare HMO | Admitting: Pharmacist

## 2019-09-30 DIAGNOSIS — E1143 Type 2 diabetes mellitus with diabetic autonomic (poly)neuropathy: Secondary | ICD-10-CM

## 2019-09-30 NOTE — Chronic Care Management (AMB) (Signed)
  Chronic Care Management   Note  09/30/2019- late entry Name: Dana Sparks MRN: [id number] DOB: 08-Mar-1951  69 y.o. year old female referred to Chronic Care Management by Dr. Ancil Boozer for medication assistance. Patient engaged with CCM since summer 2020. Returning patient's voicemail today.   Was unable to reach patient via telephone today and have left HIPAA compliant voicemail asking patient to return my call. (unsuccessful outreach #1).  Follow up plan: A HIPPA compliant phone message was left for the patient providing contact information and requesting a return call.  The care management team will reach out to the patient again over the next 5-7 days.   [name], PharmD Clinical Pharmacist [facility] [phone]

## 2019-09-30 NOTE — Chronic Care Management (AMB) (Signed)
  Chronic Care Management   Note  09/30/2019 Name: Dana Sparks MRN: [id number] DOB: Jan 27, 1951  Successful telephone outreach to patient. HIPAA identifiers verified.  Patient states that she has provided 2021 SSA benefit letter to Dr. Joycie Peek office and signed one application, not two. She is not sure what application she completed at Dr. Joycie Peek. She is down to 1 box of Basaglar and only a few more Synjardy tablets.   Through Care Everywhere, it appears that there may be an issue with the financial record that [name] provided to Dr. Gabriel Carina. It appears the application she completed was for [employer] health practitioner) but not [employer] Iva Boop).   Goals Addressed            This Visit's Progress   . I am having trouble affording some of my medicines (pt-stated)       Current Barriers:  . financial  Pharmacist Clinical Goal(s): Over the next 14 days, [name] will provide the necessary supplementary documents (proof of out of pocket prescription expenditure, proof of household income) needed for medication assistance applications to CCM pharmacist.   Interventions: . Patient to apply for medication assistance for 2021 cycle for Basaglar [employer]) and Synjardy [employer]) through [employer] office as Dr. Gabriel Carina is the prescriber o Updated 1/26: patient requesting that pharmacist contact Dr. Tonie Griffith   Patient Self Care Activities:  Marland Kitchen Gather necessary documents needed to apply for medication assistance  Please see past updates related to this goal by clicking on the "Past Updates" button in the selected goal          Follow up plan: CCM pharmacist to contact Gordy Councilman of Dr. Joycie Peek office explaining two separate applications and have Janett Billow contact patient to clarify what they do and do not have.   [name], PharmD Clinical Pharmacist [facility] [phone]

## 2019-10-01 DIAGNOSIS — B351 Tinea unguium: Secondary | ICD-10-CM | POA: Diagnosis not present

## 2019-10-01 DIAGNOSIS — E1143 Type 2 diabetes mellitus with diabetic autonomic (poly)neuropathy: Secondary | ICD-10-CM | POA: Diagnosis not present

## 2019-10-01 DIAGNOSIS — Q66211 Congenital metatarsus primus varus, right foot: Secondary | ICD-10-CM | POA: Diagnosis not present

## 2019-10-01 DIAGNOSIS — Z6841 Body Mass Index (BMI) 40.0 and over, adult: Secondary | ICD-10-CM | POA: Diagnosis not present

## 2019-10-01 DIAGNOSIS — L851 Acquired keratosis [keratoderma] palmaris et plantaris: Secondary | ICD-10-CM | POA: Diagnosis not present

## 2019-10-03 ENCOUNTER — Other Ambulatory Visit: Payer: Self-pay

## 2019-10-03 DIAGNOSIS — I10 Essential (primary) hypertension: Secondary | ICD-10-CM

## 2019-10-03 NOTE — Telephone Encounter (Signed)
Request for diabetes medication. Glipizide to [facility]  Also requesting Allopurinol   Last office visit pertaining to diabetes: 07/24/2019  Lab Results  Component Value Date   HGBA1C 7.8 01/07/2019   Hypertension medication request: Lisinopril   BP Readings from Last 3 Encounters:  05/06/19 112/68  03/24/19 130/69  03/17/19 (!) 150/80    Lab Results  Component Value Date   CREATININE 0.88 03/17/2019   BUN 15 03/17/2019   NA 141 03/17/2019   K 4.3 03/17/2019   CL 104 03/17/2019   CO2 28 03/17/2019      Follow up on 10/24/2019

## 2019-10-04 MED ORDER — LISINOPRIL 20 MG PO TABS: 20.0000 mg | ORAL_TABLET | Freq: Every day | ORAL | 1 refills | Status: DC

## 2019-10-04 MED ORDER — ALLOPURINOL 100 MG PO TABS: 100.0000 mg | ORAL_TABLET | Freq: Every day | ORAL | 1 refills | Status: DC

## 2019-10-16 ENCOUNTER — Other Ambulatory Visit: Payer: Self-pay

## 2019-10-16 ENCOUNTER — Encounter: Payer: Self-pay | Admitting: Family Medicine

## 2019-10-16 ENCOUNTER — Ambulatory Visit (INDEPENDENT_AMBULATORY_CARE_PROVIDER_SITE_OTHER): Payer: Medicare HMO | Admitting: Family Medicine

## 2019-10-16 VITALS — BP 100/70 | HR 97 | Temp 97.8°F | Resp 16 | Ht 63.5 in | Wt 218.8 lb

## 2019-10-16 DIAGNOSIS — M791 Myalgia, unspecified site: Secondary | ICD-10-CM

## 2019-10-16 DIAGNOSIS — J3089 Other allergic rhinitis: Secondary | ICD-10-CM

## 2019-10-16 DIAGNOSIS — J410 Simple chronic bronchitis: Secondary | ICD-10-CM

## 2019-10-16 MED ORDER — LORATADINE 10 MG PO TABS: 10.0000 mg | ORAL_TABLET | Freq: Every day | ORAL | 1 refills | Status: DC

## 2019-10-16 MED ORDER — COENZYME Q10 100 MG PO CAPS: 100.0000 mg | ORAL_CAPSULE | Freq: Every day | ORAL | 1 refills | Status: DC

## 2019-10-16 MED ORDER — FLUTICASONE-SALMETEROL 250-50 MCG/DOSE IN AEPB: 1.0000 | INHALATION_SPRAY | Freq: Two times a day (BID) | RESPIRATORY_TRACT | 0 refills | Status: DC

## 2019-10-16 NOTE — Progress Notes (Signed)
Name: Dana Sparks   MRN: [id number]    DOB: [birth year]   Date:10/16/2019       Progress Note  Subjective  Chief Complaint  Chief Complaint  Patient presents with  . Medication Management    HPI  She brought all her medications today. She is concerned because Anoro is no longer covered by insurance and Stiolto will cost her over $400 per month. We will try switching to generic Advair, discussed importance of rinse her mouth to avoid oral candidiasis.  Also advised to to stop taking benadryl and resume loratadine, stop Aloe vera and apple cider vinegar supplementation May continue Iron, CoQ10 , Turmeric, aspirin and vitamin D otc Cinnamon can be used in patients with diabetes but I would prefer if she just uses the powder on her drinks or a stick instead of capsules  Chronic bronchitis: she has been doing well on Anoro, , no cough, wheezing or SOB. She states the cost is very high and we will try changing to Advair Immunization is up to date . She received COVID-19 first dose   Perennial AR: she has been taking benadryl at night advised her to go back on Loratadine, and continues singulair, explained benadryl is not indicated for patients over 65. She has noticed some headaches and nasal congestion, some blood when she wipes right nostril in the mornings, she should try using a humidifier. She uses a CPAP machine.   Patient Active Problem List   Diagnosis Date Noted  . Disease of stomach   . Hx of colonic polyps   . Hepatomegaly 07/23/2018  . Fatty liver 07/23/2018  . Aortic atherosclerosis ([place]) 07/09/2018  . Abnormal endocrine laboratory test finding 06/30/2018  . Type II or unspecified type diabetes mellitus with neurological manifestations, not stated as uncontrolled(250.60) 09/03/2017  . Nasal polyp 09/03/2017  . Lipoma of back 09/03/2017  . Claudication ([place]) 06/04/2017  . LVH (left ventricular hypertrophy) 02/27/2017  . Decreased cardiac ejection fraction 02/27/2017  . Left  hand weakness 10/12/2016  . Elevated antinuclear antibody (ANA) level 04/27/2016  . Angina pectoris ([place]) 11/01/2015  . Well controlled type 2 diabetes mellitus with gastroparesis ([place]) 06/22/2015  . Low TSH level 06/22/2015  . Iron deficiency anemia 04/07/2015  . Intestinal metaplasia of gastric mucosa 03/11/2015  . CAD (coronary artery disease), native coronary artery 02/24/2015  . History of coronary artery stent placement 02/24/2015  . Chronic kidney disease, stage 3, mod decreased GFR 02/24/2015  . Menopause 02/24/2015  . History of Helicobacter pylori infection 02/24/2015  . Mild mitral insufficiency 02/24/2015  . Mild tricuspid insufficiency 02/24/2015  . 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 ([place]) 02/24/2015  . Controlled gout 02/24/2015  . Depression, major, recurrent, mild ([place]) 02/24/2015  . Morbid obesity due to excess calories ([place]) 02/24/2015  . Mild pulmonary hypertension ([place]) 02/24/2015  . Gastroesophageal reflux disease without esophagitis 02/24/2015  . Perennial allergic rhinitis 02/24/2015  . HLD (hyperlipidemia) 02/20/2015  . Hypertension, benign 02/20/2015  . Apnea, sleep 02/20/2015  . Controlled diabetes mellitus with stage 3 chronic kidney disease, without long-term current use of insulin ([place]) 02/20/2015    Past Surgical History:  Procedure Laterality Date  . ABDOMINAL HYSTERECTOMY     total  . CATARACT EXTRACTION    . CATARACT EXTRACTION W/PHACO Left 03/24/2019   Procedure: CATARACT EXTRACTION PHACO AND INTRAOCULAR LENS PLACEMENT ([place])  LEFT DIABETIC;  Surgeon: Eulogio Bear, MD;  Location: [place];  Service: Ophthalmology;  Laterality: Left;  Diabetic - insulin and oral meds sleep apnea  . COLONOSCOPY  01/2014   sigmoid diverticulosis. desc colon TA, hyperplastic polyp  . COLONOSCOPY WITH PROPOFOL N/A 09/02/2018   Procedure: COLONOSCOPY WITH PROPOFOL;  Surgeon: Lucilla Lame, MD;  Location: [place];   Service: Endoscopy;  Laterality: N/A;  . CORONARY ANGIOPLASTY WITH STENT PLACEMENT    . ESOPHAGOGASTRODUODENOSCOPY N/A 02/16/2015   negative h pylori, focal gastic intestinal metaplasia  . ESOPHAGOGASTRODUODENOSCOPY (EGD) WITH PROPOFOL N/A 09/02/2018   Procedure: ESOPHAGOGASTRODUODENOSCOPY (EGD) WITH BIOPSIES;  Surgeon: Lucilla Lame, MD;  Location: [place];  Service: Endoscopy;  Laterality: N/A;  diabetic -insulin and oral meds sleep apnea  . TOTAL KNEE ARTHROPLASTY Right     Family History  Problem Relation Age of Onset  . Stroke Sister   . Hyperlipidemia Sister   . Alcohol abuse Brother   . Diabetes Brother   . Stroke Brother   . Hypertension Mother   . Diabetes Mother   . Heart disease Mother   . Diabetes Father   . Heart disease Father   . Hypertension Father   . Hypertension Sister   . Cancer Maternal Grandmother        Unsure  . Diabetes Maternal Grandfather   . Colon cancer Neg Hx   . Liver disease Neg Hx   . Breast cancer Neg Hx     Social History   Tobacco Use  . Smoking status: Former Smoker    Packs/day: 1.00    Years: 40.00    Pack years: 40.00    Types: Cigarettes    Start date: 09/04/1972    Quit date: 12/30/2012    Years since quitting: 6.7  . Smokeless tobacco: Never Used  . Tobacco comment: smoking cessation materials not required  Substance Use Topics  . Alcohol use: No    Alcohol/week: 0.0 standard drinks  . Drug use: No     Current Outpatient Medications:  .  CINNAMON PO, Take 1,000 mg by mouth daily., Disp: , Rfl:  .  Lancets 28G MISC, Use 1 each 2 (two) times daily accu chek. Diagnosis: E11.22, Disp: , Rfl:  .  Turmeric (QC TUMERIC COMPLEX) 500 MG CAPS, Take 500 mg by mouth daily., Disp: , Rfl:  .  ACCU-CHEK FASTCLIX LANCETS MISC, , Disp: , Rfl:  .  ACCU-CHEK SMARTVIEW test strip, Check fsbs two times daily  DMII, Disp: 100 each, Rfl: 6 .  Alcohol Swabs (B-D SINGLE USE SWABS REGULAR) PADS, 1 each by Does not apply route 4 (four)  times daily., Disp: 200 each, Rfl: 2 .  allopurinol (ZYLOPRIM) 100 MG tablet, Take 1 tablet (100 mg total) by mouth daily., Disp: 90 tablet, Rfl: 1 .  amLODipine (NORVASC) 5 MG tablet, Take 1 tablet (5 mg total) by mouth daily., Disp: 90 tablet, Rfl: 1 .  aspirin EC 81 MG tablet, Take 81 mg by mouth every morning. , Disp: , Rfl:  .  BD INSULIN SYRINGE U/F 31G X 5/16" 0.3 ML MISC, , Disp: , Rfl:  .  blood glucose meter kit and supplies KIT, Dispense based on patient and insurance preference. Use up to four times daily as directed. (FOR ICD-9 250.00, 250.01)., Disp: 1 each, Rfl: 0 .  Cholecalciferol (VITAMIN D-3 PO), Take 1,000 Units by mouth 2 (two) times a day., Disp: , Rfl:  .  cloNIDine (CATAPRES) 0.1 MG tablet, TAKE 1 TABLET EVERY EVENING (START TAKING 1 TABLET AT BEDTIME FOR SWEATING, MAY INCREASE TO TWICE DAILY AS  TOLERATED), Disp: 180 tablet, Rfl: 1 .  Coenzyme Q10 100 MG capsule, Take 1 capsule (100 mg total) by mouth daily., Disp: 90 capsule, Rfl: 1 .  DENTA 5000 PLUS 1.1 % CREA dental cream, , Disp: , Rfl:  .  DROPLET PEN NEEDLES 31G X 5 MM MISC, , Disp: , Rfl:  .  DUREZOL 0.05 % EMUL, , Disp: , Rfl:  .  Estradiol (ESTRACE VA), Place vaginally as needed., Disp: , Rfl:  .  Ferrous Sulfate (IRON) 325 (65 Fe) MG TABS, Take 325 mg by mouth 2 (two) times daily. Nature made brand-  Pt takes one tablet twice a day , Disp: , Rfl:  .  fluticasone (FLONASE) 50 MCG/ACT nasal spray, Place 2 sprays into both nostrils daily., Disp: 48 g, Rfl: 2 .  Fluticasone-Salmeterol (ADVAIR) 250-50 MCG/DOSE AEPB, Inhale 1 puff into the lungs 2 (two) times daily., Disp: 180 each, Rfl: 0 .  glipiZIDE (GLUCOTROL XL) 5 MG 24 hr tablet, Take 1 tablet by mouth daily., Disp: , Rfl:  .  Insulin Glargine (BASAGLAR KWIKPEN) 100 UNIT/ML SOPN, Inject 60 Units into the skin at bedtime., Disp: , Rfl:  .  Insulin Pen Needle (FIFTY50 PEN NEEDLES) 31G X 5 MM MISC, Use once daily, Disp: , Rfl:  .  isosorbide mononitrate (IMDUR) 60  MG 24 hr tablet, Take 1 tablet (60 mg total) by mouth daily., Disp: 90 tablet, Rfl: 1 .  lisinopril (ZESTRIL) 20 MG tablet, Take 1 tablet (20 mg total) by mouth daily., Disp: 90 tablet, Rfl: 1 .  loratadine (CLARITIN) 10 MG tablet, Take 1 tablet (10 mg total) by mouth daily., Disp: 90 tablet, Rfl: 1 .  meloxicam (MOBIC) 7.5 MG tablet, Take by mouth., Disp: , Rfl:  .  metoCLOPramide (REGLAN) 5 MG tablet, TAKE 1-2 TABLETS (5-10 MG TOTAL) FOUR TIMES DAILY   BEFORE MEALS AND AT BEDTIME., Disp: 120 tablet, Rfl: 0 .  metoprolol succinate (TOPROL-XL) 50 MG 24 hr tablet, Take 1 tablet (50 mg total) by mouth daily., Disp: 90 tablet, Rfl: 3 .  montelukast (SINGULAIR) 10 MG tablet, Take 1 tablet (10 mg total) by mouth every evening., Disp: 90 tablet, Rfl: 1 .  pantoprazole (PROTONIX) 40 MG tablet, Take 1 tablet (40 mg total) by mouth every morning., Disp: 90 tablet, Rfl: 1 .  rosuvastatin (CRESTOR) 20 MG tablet, Take 1 tablet (20 mg total) by mouth daily., Disp: 90 tablet, Rfl: 1 .  SYNJARDY XR 12.01-999 MG TB24, Take 2 tablets by mouth daily., Disp: , Rfl:  .  triamcinolone cream (KENALOG) 0.1 %, Apply 1 application topically 2 (two) times daily., Disp: 30 g, Rfl: 0 .  venlafaxine XR (EFFEXOR-XR) 75 MG 24 hr capsule, Take one tablet a day., Disp: 90 capsule, Rfl: 1  Allergies  Allergen Reactions  . Codeine Other (See Comments)    Unknown reaction  . Contrast Media [Iodinated Diagnostic Agents] Itching  . Sulfa Antibiotics Itching    I personally reviewed active problem list, medication list, allergies, family history, social history with the patient/caregiver today.   ROS  Constitutional: Negative for fever or weight change.  Respiratory: Negative for cough and shortness of breath.   Cardiovascular: Negative for chest pain or palpitations.  Gastrointestinal: Negative for abdominal pain, no bowel changes.  Musculoskeletal: Negative for gait problem or joint swelling.  Skin: Negative for rash.   Neurological: Negative for dizziness, positive for  headache.  No other specific complaints in a complete review of systems (except as listed in  HPI above).  Objective  Vitals:   10/16/19 1045  BP: 100/70  Pulse: 97  Resp: 16  Temp: 97.8 F (36.6 C)  TempSrc: Temporal  SpO2: 94%  Weight: 218 lb 12.8 oz (99.2 kg)  Height: 5' 3.5" (1.613 m)    Body mass index is 38.15 kg/m.  Physical Exam  Constitutional: Patient appears well-developed and well-nourished. Obese  No distress.  HEENT: head atraumatic, normocephalic, pupils equal and reactive to light, ears normal TMneck supple, oral mucosa not done  Cardiovascular: Normal rate, regular rhythm and normal heart sounds.  No murmur heard. No BLE edema. Pulmonary/Chest: Effort normal and breath sounds normal. No respiratory distress. Abdominal: Soft.  There is no tenderness. Psychiatric: Patient has a normal mood and affect. behavior is normal. Judgment and thought content normal.  PHQ2/9: Depression screen [facility] 2/9 07/24/2019 05/06/2019 03/17/2019 02/12/2019 02/06/2019  Decreased Interest 0 1 0 0 2  Down, Depressed, Hopeless 0 0 0 0 0  PHQ - 2 Score 0 1 0 0 2  Altered sleeping 0 0 0 0 0  Tired, decreased energy 0 1 0 0 1  Change in appetite 0 0 0 0 0  Feeling bad or failure about yourself  0 0 0 0 0  Trouble concentrating 0 0 0 0 0  Moving slowly or fidgety/restless 0 0 0 0 0  Suicidal thoughts 0 0 0 0 0  PHQ-9 Score 0 2 0 0 3  Difficult doing work/chores - - - Not difficult at all Not difficult at all  Some recent data might be hidden    phq 9 is negative   Fall Risk: Fall Risk  10/16/2019 05/06/2019 03/17/2019 02/12/2019 02/06/2019  Falls in the past year? 0 0 0 0 0  Number falls in past yr: 0 0 0 - 0  Injury with Fall? 0 0 0 - 0  Comment - - - - -  Risk for fall due to : - - - - -      Assessment & Plan   1. Simple chronic bronchitis (HCC)  - Fluticasone-Salmeterol (ADVAIR) 250-50 MCG/DOSE AEPB; Inhale 1 puff into the  lungs 2 (two) times daily.  Dispense: 180 each; Refill: 0  2. Perennial allergic rhinitis  - loratadine (CLARITIN) 10 MG tablet; Take 1 tablet (10 mg total) by mouth daily.  Dispense: 90 tablet; Refill: 1

## 2019-10-19 DIAGNOSIS — G4733 Obstructive sleep apnea (adult) (pediatric): Secondary | ICD-10-CM | POA: Diagnosis not present

## 2019-10-24 ENCOUNTER — Ambulatory Visit: Payer: Medicare HMO | Admitting: Family Medicine

## 2019-10-27 DIAGNOSIS — Z23 Encounter for immunization: Secondary | ICD-10-CM | POA: Diagnosis not present

## 2019-10-31 ENCOUNTER — Telehealth: Payer: Self-pay

## 2019-10-31 NOTE — Telephone Encounter (Signed)
Patient called with concerns of nosebleeds. She has been using Flonase nasal spray and another otc nasal spray along with Claritin and nosebleeds have not resolved. Please advise. Patient's mother in law just passed so she has a lot going on right now.

## 2019-11-05 DIAGNOSIS — E1142 Type 2 diabetes mellitus with diabetic polyneuropathy: Secondary | ICD-10-CM | POA: Diagnosis not present

## 2019-11-05 DIAGNOSIS — E1165 Type 2 diabetes mellitus with hyperglycemia: Secondary | ICD-10-CM | POA: Diagnosis not present

## 2019-11-05 DIAGNOSIS — E1159 Type 2 diabetes mellitus with other circulatory complications: Secondary | ICD-10-CM | POA: Diagnosis not present

## 2019-11-05 LAB — MICROALBUMIN, URINE: Microalb, Ur: 15.9

## 2019-11-05 LAB — BASIC METABOLIC PANEL
BUN: 15 (ref 4–21)
Glucose: 89
Potassium: 3.9 (ref 3.4–5.3)
Sodium: 140 (ref 137–147)

## 2019-11-05 LAB — HEMOGLOBIN A1C: Hemoglobin A1C: 8.2

## 2019-11-12 ENCOUNTER — Ambulatory Visit: Payer: Medicare HMO | Admitting: Family Medicine

## 2019-11-13 ENCOUNTER — Other Ambulatory Visit: Payer: Self-pay

## 2019-11-13 ENCOUNTER — Encounter: Payer: Self-pay | Admitting: Family Medicine

## 2019-11-13 ENCOUNTER — Ambulatory Visit (INDEPENDENT_AMBULATORY_CARE_PROVIDER_SITE_OTHER): Payer: Medicare HMO | Admitting: Family Medicine

## 2019-11-13 VITALS — BP 130/80 | HR 83 | Temp 97.5°F | Resp 14 | Ht 66.0 in | Wt 216.6 lb

## 2019-11-13 DIAGNOSIS — F330 Major depressive disorder, recurrent, mild: Secondary | ICD-10-CM

## 2019-11-13 DIAGNOSIS — K219 Gastro-esophageal reflux disease without esophagitis: Secondary | ICD-10-CM

## 2019-11-13 DIAGNOSIS — E1169 Type 2 diabetes mellitus with other specified complication: Secondary | ICD-10-CM | POA: Diagnosis not present

## 2019-11-13 DIAGNOSIS — J302 Other seasonal allergic rhinitis: Secondary | ICD-10-CM

## 2019-11-13 DIAGNOSIS — E785 Hyperlipidemia, unspecified: Secondary | ICD-10-CM

## 2019-11-13 DIAGNOSIS — E66811 Obesity, class 1: Secondary | ICD-10-CM

## 2019-11-13 DIAGNOSIS — G4733 Obstructive sleep apnea (adult) (pediatric): Secondary | ICD-10-CM | POA: Diagnosis not present

## 2019-11-13 DIAGNOSIS — J410 Simple chronic bronchitis: Secondary | ICD-10-CM | POA: Diagnosis not present

## 2019-11-13 DIAGNOSIS — E1143 Type 2 diabetes mellitus with diabetic autonomic (poly)neuropathy: Secondary | ICD-10-CM | POA: Diagnosis not present

## 2019-11-13 DIAGNOSIS — I272 Pulmonary hypertension, unspecified: Secondary | ICD-10-CM

## 2019-11-13 DIAGNOSIS — I10 Essential (primary) hypertension: Secondary | ICD-10-CM

## 2019-11-13 DIAGNOSIS — R61 Generalized hyperhidrosis: Secondary | ICD-10-CM

## 2019-11-13 DIAGNOSIS — I209 Angina pectoris, unspecified: Secondary | ICD-10-CM

## 2019-11-13 DIAGNOSIS — E669 Obesity, unspecified: Secondary | ICD-10-CM

## 2019-11-13 DIAGNOSIS — J3089 Other allergic rhinitis: Secondary | ICD-10-CM

## 2019-11-13 MED ORDER — ROSUVASTATIN CALCIUM 20 MG PO TABS: 20.0000 mg | ORAL_TABLET | Freq: Every day | ORAL | 1 refills | Status: DC

## 2019-11-13 MED ORDER — PANTOPRAZOLE SODIUM 40 MG PO TBEC: 40.0000 mg | DELAYED_RELEASE_TABLET | Freq: Every morning | ORAL | 1 refills | Status: DC

## 2019-11-13 MED ORDER — ISOSORBIDE MONONITRATE ER 60 MG PO TB24: 60.0000 mg | ORAL_TABLET | Freq: Every day | ORAL | 1 refills | Status: DC

## 2019-11-13 MED ORDER — CLONIDINE HCL 0.1 MG PO TABS: 0.1000 mg | ORAL_TABLET | Freq: Every evening | ORAL | 1 refills | Status: DC

## 2019-11-13 MED ORDER — AMLODIPINE BESYLATE 5 MG PO TABS: 5.0000 mg | ORAL_TABLET | Freq: Every day | ORAL | 1 refills | Status: DC

## 2019-11-13 MED ORDER — VENLAFAXINE HCL ER 75 MG PO CP24: ORAL_CAPSULE | ORAL | 1 refills | Status: DC

## 2019-11-13 MED ORDER — MONTELUKAST SODIUM 10 MG PO TABS: 10.0000 mg | ORAL_TABLET | Freq: Every evening | ORAL | 1 refills | Status: DC

## 2019-11-13 NOTE — Progress Notes (Signed)
This encounter was created in error - please disregard.

## 2019-11-13 NOTE — Progress Notes (Signed)
Name: Dana Sparks   MRN: [id number]    DOB: [birth year]   Date:11/13/2019       Progress Note  Subjective  Chief Complaint  No chief complaint on file.   HPI   DMII with gastroparesis/CKI/neuropathy: she is taking medication as prescribed, seeing Dr. Gabriel Carina.Her hgbA1C has gone up from  7.8% to 8.2%, and is again 8.2% per Dr. Joycie Peek notes , she is on Basaglar 60 units, she is on Synjardi and will switch from Glipizide to Trulicity soon. She states fasting glucose has been well controlled 93-200 fasting, but better this week,  no hypoglycemic episodes. She has intermittent  polyphagia, polyuria or polydipsia. She has recently stopped eating before going to bed and has improved her glucose   HTN: she has been compliant with medication, no dizziness, chest pain or palpitation. BP is at goal, no side effects of medication    Angina: CAD, her cardiologist is Dr. Virgia Land skipped her visit in 2020 because of the pandemic. Marland KitchenShe is also on statin therapy, aspirin, beta blockerand ace. She was on disability for heart disease for years but in social security now. Last  Echo from 2016 showed decreased in EF 25-30% and global hypokines. She has some SOB with activity, she does not have orthopnea or PND  OSA: she has not been using CPAP for over one month, she is using humidifier in her bedroom. She has a new CPAP machine , we will add a humidifier through her machine since she has sore throat without it   Major Depression Mild: she is doing well on Effexor again, she was off medication and had severe emotional liability, back on medication for a few weeks and denies crying spellsphq 9 is normal today but she states she still feels down at times   Obesity:she is trying to eat healthy,she is not going to the gym because of COVID-19 but she has been walking more often - three times a week with two friends.   Hyperlipidemia: taking statin therapy, no muscle problems, reviewed last labs  with patient , LDL 66   Chronic bronchitis: spirometry today showed normal Fev1/FVC ration at 94% she used to smoke 1 pack daily for 50 years, quit smoking in 2014 she states no longer coughing in the mornings, has some sob with moderate activity and no wheezing. . She is off Anoro because of the cost, but is using Advair twice daily at this time  Hypercalcemia: labs done at Dr. Gabriel Carina showed calcium back to normal   Patient Active Problem List   Diagnosis Date Noted  . Disease of stomach   . Hx of colonic polyps   . Hepatomegaly 07/23/2018  . Fatty liver 07/23/2018  . Aortic atherosclerosis ([place]) 07/09/2018  . Abnormal endocrine laboratory test finding 06/30/2018  . Type II or unspecified type diabetes mellitus with neurological manifestations, not stated as uncontrolled(250.60) 09/03/2017  . Nasal polyp 09/03/2017  . Lipoma of back 09/03/2017  . Claudication ([place]) 06/04/2017  . LVH (left ventricular hypertrophy) 02/27/2017  . Decreased cardiac ejection fraction 02/27/2017  . Left hand weakness 10/12/2016  . Elevated antinuclear antibody (ANA) level 04/27/2016  . Angina pectoris ([place]) 11/01/2015  . Well controlled type 2 diabetes mellitus with gastroparesis ([place]) 06/22/2015  . Low TSH level 06/22/2015  . Iron deficiency anemia 04/07/2015  . Intestinal metaplasia of gastric mucosa 03/11/2015  . CAD (coronary artery disease), native coronary artery 02/24/2015  . History of coronary artery stent placement 02/24/2015  . Chronic kidney disease,  stage 3, mod decreased GFR 02/24/2015  . Menopause 02/24/2015  . History of Helicobacter pylori infection 02/24/2015  . Mild mitral insufficiency 02/24/2015  . Mild tricuspid insufficiency 02/24/2015  . 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 ([place]) 02/24/2015  . Controlled gout 02/24/2015  . Depression, major, recurrent, mild ([place]) 02/24/2015  . Morbid obesity due to excess calories ([place]) 02/24/2015  . Mild pulmonary  hypertension ([place]) 02/24/2015  . Gastroesophageal reflux disease without esophagitis 02/24/2015  . Perennial allergic rhinitis 02/24/2015  . HLD (hyperlipidemia) 02/20/2015  . Hypertension, benign 02/20/2015  . Apnea, sleep 02/20/2015  . Controlled diabetes mellitus with stage 3 chronic kidney disease, without long-term current use of insulin ([place]) 02/20/2015    Past Surgical History:  Procedure Laterality Date  . ABDOMINAL HYSTERECTOMY     total  . CATARACT EXTRACTION    . CATARACT EXTRACTION W/PHACO Left 03/24/2019   Procedure: CATARACT EXTRACTION PHACO AND INTRAOCULAR LENS PLACEMENT ([place])  LEFT DIABETIC;  Surgeon: Eulogio Bear, MD;  Location: [facility];  Service: Ophthalmology;  Laterality: Left;  Diabetic - insulin and oral meds sleep apnea  . COLONOSCOPY  01/2014   sigmoid diverticulosis. desc colon TA, hyperplastic polyp  . COLONOSCOPY WITH PROPOFOL N/A 09/02/2018   Procedure: COLONOSCOPY WITH PROPOFOL;  Surgeon: Lucilla Lame, MD;  Location: [facility];  Service: Endoscopy;  Laterality: N/A;  . CORONARY ANGIOPLASTY WITH STENT PLACEMENT    . ESOPHAGOGASTRODUODENOSCOPY N/A 02/16/2015   negative h pylori, focal gastic intestinal metaplasia  . ESOPHAGOGASTRODUODENOSCOPY (EGD) WITH PROPOFOL N/A 09/02/2018   Procedure: ESOPHAGOGASTRODUODENOSCOPY (EGD) WITH BIOPSIES;  Surgeon: Lucilla Lame, MD;  Location: [facility];  Service: Endoscopy;  Laterality: N/A;  diabetic -insulin and oral meds sleep apnea  . TOTAL KNEE ARTHROPLASTY Right     Family History  Problem Relation Age of Onset  . Stroke Sister   . Hyperlipidemia Sister   . Alcohol abuse Brother   . Diabetes Brother   . Stroke Brother   . Hypertension Mother   . Diabetes Mother   . Heart disease Mother   . Diabetes Father   . Heart disease Father   . Hypertension Father   . Hypertension Sister   . Cancer Maternal Grandmother        Unsure  . Diabetes Maternal Grandfather   . Colon cancer  Neg Hx   . Liver disease Neg Hx   . Breast cancer Neg Hx     Social History   Tobacco Use  . Smoking status: Former Smoker    Packs/day: 1.00    Years: 40.00    Pack years: 40.00    Types: Cigarettes    Start date: 09/04/1972    Quit date: 12/30/2012    Years since quitting: 6.8  . Smokeless tobacco: Never Used  . Tobacco comment: smoking cessation materials not required  Substance Use Topics  . Alcohol use: No    Alcohol/week: 0.0 standard drinks     Current Outpatient Medications:  .  ACCU-CHEK FASTCLIX LANCETS MISC, , Disp: , Rfl:  .  ACCU-CHEK SMARTVIEW test strip, Check fsbs two times daily  DMII, Disp: 100 each, Rfl: 6 .  Alcohol Swabs (B-D SINGLE USE SWABS REGULAR) PADS, 1 each by Does not apply route 4 (four) times daily., Disp: 200 each, Rfl: 2 .  allopurinol (ZYLOPRIM) 100 MG tablet, Take 1 tablet (100 mg total) by mouth daily., Disp: 90 tablet, Rfl: 1 .  amLODipine (NORVASC) 5 MG tablet, Take 1  tablet (5 mg total) by mouth daily., Disp: 90 tablet, Rfl: 1 .  aspirin EC 81 MG tablet, Take 81 mg by mouth every morning. , Disp: , Rfl:  .  BD INSULIN SYRINGE U/F 31G X 5/16" 0.3 ML MISC, , Disp: , Rfl:  .  blood glucose meter kit and supplies KIT, Dispense based on patient and insurance preference. Use up to four times daily as directed. (FOR ICD-9 250.00, 250.01)., Disp: 1 each, Rfl: 0 .  Cholecalciferol (VITAMIN D-3 PO), Take 1,000 Units by mouth 2 (two) times a day., Disp: , Rfl:  .  CINNAMON PO, Take 1,000 mg by mouth daily., Disp: , Rfl:  .  cloNIDine (CATAPRES) 0.1 MG tablet, TAKE 1 TABLET EVERY EVENING (START TAKING 1 TABLET AT BEDTIME FOR SWEATING, MAY INCREASE TO TWICE DAILY AS TOLERATED), Disp: 180 tablet, Rfl: 1 .  Coenzyme Q10 100 MG capsule, Take 1 capsule (100 mg total) by mouth daily., Disp: 90 capsule, Rfl: 1 .  DENTA 5000 PLUS 1.1 % CREA dental cream, , Disp: , Rfl:  .  DROPLET PEN NEEDLES 31G X 5 MM MISC, , Disp: , Rfl:  .  DUREZOL 0.05 % EMUL, , Disp: ,  Rfl:  .  Estradiol (ESTRACE VA), Place vaginally as needed., Disp: , Rfl:  .  Ferrous Sulfate (IRON) 325 (65 Fe) MG TABS, Take 325 mg by mouth 2 (two) times daily. Nature made brand-  Pt takes one tablet twice a day , Disp: , Rfl:  .  fluticasone (FLONASE) 50 MCG/ACT nasal spray, Place 2 sprays into both nostrils daily., Disp: 48 g, Rfl: 2 .  Fluticasone-Salmeterol (ADVAIR) 250-50 MCG/DOSE AEPB, Inhale 1 puff into the lungs 2 (two) times daily., Disp: 180 each, Rfl: 0 .  glipiZIDE (GLUCOTROL XL) 5 MG 24 hr tablet, Take 1 tablet by mouth daily., Disp: , Rfl:  .  Insulin Glargine (BASAGLAR KWIKPEN) 100 UNIT/ML SOPN, Inject 60 Units into the skin at bedtime., Disp: , Rfl:  .  Insulin Pen Needle (FIFTY50 PEN NEEDLES) 31G X 5 MM MISC, Use once daily, Disp: , Rfl:  .  isosorbide mononitrate (IMDUR) 60 MG 24 hr tablet, Take 1 tablet (60 mg total) by mouth daily., Disp: 90 tablet, Rfl: 1 .  Lancets 28G MISC, Use 1 each 2 (two) times daily accu chek. Diagnosis: E11.22, Disp: , Rfl:  .  lisinopril (ZESTRIL) 20 MG tablet, Take 1 tablet (20 mg total) by mouth daily., Disp: 90 tablet, Rfl: 1 .  loratadine (CLARITIN) 10 MG tablet, Take 1 tablet (10 mg total) by mouth daily., Disp: 90 tablet, Rfl: 1 .  meloxicam (MOBIC) 7.5 MG tablet, Take by mouth., Disp: , Rfl:  .  metoCLOPramide (REGLAN) 5 MG tablet, TAKE 1-2 TABLETS (5-10 MG TOTAL) FOUR TIMES DAILY   BEFORE MEALS AND AT BEDTIME., Disp: 120 tablet, Rfl: 0 .  metoprolol succinate (TOPROL-XL) 50 MG 24 hr tablet, Take 1 tablet (50 mg total) by mouth daily., Disp: 90 tablet, Rfl: 3 .  montelukast (SINGULAIR) 10 MG tablet, Take 1 tablet (10 mg total) by mouth every evening., Disp: 90 tablet, Rfl: 1 .  pantoprazole (PROTONIX) 40 MG tablet, Take 1 tablet (40 mg total) by mouth every morning., Disp: 90 tablet, Rfl: 1 .  rosuvastatin (CRESTOR) 20 MG tablet, Take 1 tablet (20 mg total) by mouth daily., Disp: 90 tablet, Rfl: 1 .  SYNJARDY XR 12.01-999 MG TB24, Take 2  tablets by mouth daily., Disp: , Rfl:  .  triamcinolone cream (  KENALOG) 0.1 %, Apply 1 application topically 2 (two) times daily., Disp: 30 g, Rfl: 0 .  Turmeric (QC TUMERIC COMPLEX) 500 MG CAPS, Take 500 mg by mouth daily., Disp: , Rfl:  .  venlafaxine XR (EFFEXOR-XR) 75 MG 24 hr capsule, Take one tablet a day., Disp: 90 capsule, Rfl: 1  Allergies  Allergen Reactions  . Codeine Other (See Comments)    Unknown reaction  . Contrast Media [Iodinated Diagnostic Agents] Itching  . Sulfa Antibiotics Itching    I personally reviewed active problem list, medication list, allergies, family history, social history with the patient/caregiver today.   ROS  Constitutional: Negative for fever or weight change.  Respiratory: Negative for cough , but has  shortness of breath with activity .   Cardiovascular: Negative for chest pain or palpitations.  Gastrointestinal: Negative for abdominal pain, no bowel changes.  Musculoskeletal: Negative for gait problem or joint swelling.  Skin: Negative for rash.  Neurological: Negative for dizziness or headache.  No other specific complaints in a complete review of systems (except as listed in HPI above).  Objective  Vitals:   11/13/19 0820  BP: 130/80  Pulse: 83  Resp: 14  Temp: (!) 97.5 F (36.4 C)  TempSrc: Temporal  SpO2: 95%  Weight: 216 lb 9.6 oz (98.2 kg)  Height: '5\' 6"'  (1.676 m)    Body mass index is 34.96 kg/m.  Physical Exam  Constitutional: Patient appears well-developed and well-nourished. Obese  No distress.  HEENT: head atraumatic, normocephalic, pupils equal and reactive to light Cardiovascular: Normal rate, regular rhythm and normal heart sounds.  No murmur heard. No BLE edema. Pulmonary/Chest: Effort normal and breath sounds normal. No respiratory distress. Abdominal: Soft.  There is no tenderness. Psychiatric: Patient has a normal mood and affect. behavior is normal. Judgment and thought content normal.  Recent Results  (from the past 2160 hour(s))  Basic metabolic panel     Status: None   Collection Time: 11/05/19 12:00 AM  Result Value Ref Range   Glucose 89    BUN 15 4 - 21   Potassium 3.9 3.4 - 5.3   Sodium 140 137 - 147  Hemoglobin A1c     Status: None   Collection Time: 11/05/19 12:00 AM  Result Value Ref Range   Hemoglobin A1C 8.2   Microalbumin, urine     Status: None   Collection Time: 11/05/19 12:00 AM  Result Value Ref Range   Microalb, Ur 15.9      PHQ2/9: Depression screen [facility] 2/9 11/13/2019 07/24/2019 05/06/2019 03/17/2019 02/12/2019  Decreased Interest 0 0 1 0 0  Down, Depressed, Hopeless 0 0 0 0 0  PHQ - 2 Score 0 0 1 0 0  Altered sleeping 0 0 0 0 0  Tired, decreased energy 0 0 1 0 0  Change in appetite 0 0 0 0 0  Feeling bad or failure about yourself  0 0 0 0 0  Trouble concentrating 0 0 0 0 0  Moving slowly or fidgety/restless 0 0 0 0 0  Suicidal thoughts 0 0 0 0 0  PHQ-9 Score 0 0 2 0 0  Difficult doing work/chores Not difficult at all - - - Not difficult at all  Some recent data might be hidden    phq 9 is negative   Fall Risk: Fall Risk  11/13/2019 10/16/2019 05/06/2019 03/17/2019 02/12/2019  Falls in the past year? 0 0 0 0 0  Number falls in past yr: 0 0 0 0 -  Injury with Fall? 0 0 0 0 -  Comment - - - - -  Risk for fall due to : - - - - -  Follow up Falls evaluation completed - - - -     Functional Status Survey: Is the patient deaf or have difficulty hearing?: No Does the patient have difficulty seeing, even when wearing glasses/contacts?: No Does the patient have difficulty concentrating, remembering, or making decisions?: No Does the patient have difficulty walking or climbing stairs?: No Does the patient have difficulty dressing or bathing?: No Does the patient have difficulty doing errands alone such as visiting a doctor's office or shopping?: No    Assessment & Plan  1. Diabetes mellitus with gastroparesis ([place])  She is under the care of Dr.  Gabriel Carina  2. Depression, major, recurrent, mild ([facility])  - venlafaxine XR (EFFEXOR-XR) 75 MG 24 hr capsule; Take one tablet a day.  Dispense: 90 capsule; Refill: 1  3. Hypertension, benign  - amLODipine (NORVASC) 5 MG tablet; Take 1 tablet (5 mg total) by mouth daily.  Dispense: 90 tablet; Refill: 1  4. Gastroesophageal reflux disease without esophagitis  - pantoprazole (PROTONIX) 40 MG tablet; Take 1 tablet (40 mg total) by mouth every morning.  Dispense: 90 tablet; Refill: 1  5. Angina pectoris ([facility])  - isosorbide mononitrate (IMDUR) 60 MG 24 hr tablet; Take 1 tablet (60 mg total) by mouth daily.  Dispense: 90 tablet; Refill: 1  6. Mild pulmonary hypertension ([facility])   7. Obesity   Discussed with the patient the risk posed by an increased BMI. Discussed importance of portion control, calorie counting and at least 150 minutes of physical activity weekly. Avoid sweet beverages and drink more water. Eat at least 6 servings of fruit and vegetables daily   8. Obstructive sleep apnea syndrome  She needs to resume CPAP   9. Dyslipidemia  - rosuvastatin (CRESTOR) 20 MG tablet; Take 1 tablet (20 mg total) by mouth daily.  Dispense: 90 tablet; Refill: 1  10. Dyslipidemia associated with type 2 diabetes mellitus ([place])   11. Simple chronic bronchitis ([place])  Doing well now   12. Excessive sweating  - cloNIDine (CATAPRES) 0.1 MG tablet; Take 1 tablet (0.1 mg total) by mouth every evening.  Dispense: 90 tablet; Refill: 1  13. Seasonal and perennial allergic rhinitis  - montelukast (SINGULAIR) 10 MG tablet; Take 1 tablet (10 mg total) by mouth every evening.  Dispense: 90 tablet; Refill: 1

## 2019-11-16 DIAGNOSIS — G4733 Obstructive sleep apnea (adult) (pediatric): Secondary | ICD-10-CM | POA: Diagnosis not present

## 2019-11-20 ENCOUNTER — Telehealth: Payer: Self-pay

## 2019-11-20 NOTE — Telephone Encounter (Signed)
Patient called she started having symptoms of a sinus infection on Saturday. She had clear mucous, now it is yellow. She also has headache, runny nose, sore throat, chest tightness and throat tightness. She has tried taking Coricidin. She only got a little relief from taking it. She would like an antibiotic to treat symptoms. Please send to [facility] on [address].

## 2019-11-20 NOTE — Telephone Encounter (Signed)
LVM informing patient that the requested medication can't be sent to her pharmacy without her being evaluated and that she can go UC or we can put her on a cancellation for tomorrow, 11/21/19 due to Dr. Ancil Boozer being out of the office and no current availability with other providers in the office.

## 2019-11-28 ENCOUNTER — Encounter: Payer: Self-pay | Admitting: Family Medicine

## 2019-11-28 ENCOUNTER — Ambulatory Visit (INDEPENDENT_AMBULATORY_CARE_PROVIDER_SITE_OTHER): Payer: Medicare HMO | Admitting: Family Medicine

## 2019-11-28 DIAGNOSIS — J019 Acute sinusitis, unspecified: Secondary | ICD-10-CM

## 2019-11-28 MED ORDER — AZITHROMYCIN 500 MG PO TABS: 500.0000 mg | ORAL_TABLET | Freq: Every day | ORAL | 0 refills | Status: DC

## 2019-11-28 NOTE — Progress Notes (Signed)
Name: Dana Sparks   MRN: [id number]    DOB: [birth year]   Date:11/28/2019       Progress Note  Subjective  Chief Complaint  Chief Complaint  Patient presents with  . Sinusitis    Yellowish mucous, Sore throat, Cough. Hard to get mucous up. Denies fever. Onset x 2 weeks.    I connected with  [name] on 11/28/19 at  8:20 AM EDT by telephone and verified that I am speaking with the correct person using two identifiers.  I discussed the limitations, risks, security and privacy concerns of performing an evaluation and management service by telephone and the availability of in person appointments. Staff also discussed with the patient that there may be a patient responsible charge related to this service. Patient Location: at home  Provider Location: [facility]   HPI  Post-nasal drainage: she states she developed nasal congestion, post-nasal drainage, thick phlegm in her throat, sneezing, she also  has a dry cough because of the throat irritation. Symptoms started 2 weeks ago , symptoms are not improving. It is affecting her sleep because of the drainage. No fever, chills, nausea or vomiting. No change in appetite or bowel movements. She has been taking Loratadine and Montelukast , she also uses a nasal spray. Denies SOB but has noticed mild wheezing at night.     Patient Active Problem List   Diagnosis Date Noted  . Disease of stomach   . Hx of colonic polyps   . Hepatomegaly 07/23/2018  . Fatty liver 07/23/2018  . Aortic atherosclerosis ([place]) 07/09/2018  . Abnormal endocrine laboratory test finding 06/30/2018  . Type II or unspecified type diabetes mellitus with neurological manifestations, not stated as uncontrolled(250.60) 09/03/2017  . Nasal polyp 09/03/2017  . Lipoma of back 09/03/2017  . Claudication ([place]) 06/04/2017  . LVH (left ventricular hypertrophy) 02/27/2017  . Decreased cardiac ejection fraction 02/27/2017  . Left hand weakness 10/12/2016  .  Elevated antinuclear antibody (ANA) level 04/27/2016  . Angina pectoris ([place]) 11/01/2015  . Well controlled type 2 diabetes mellitus with gastroparesis ([place]) 06/22/2015  . Low TSH level 06/22/2015  . Iron deficiency anemia 04/07/2015  . Intestinal metaplasia of gastric mucosa 03/11/2015  . CAD (coronary artery disease), native coronary artery 02/24/2015  . History of coronary artery stent placement 02/24/2015  . Chronic kidney disease, stage 3, mod decreased GFR 02/24/2015  . Menopause 02/24/2015  . History of Helicobacter pylori infection 02/24/2015  . Mild mitral insufficiency 02/24/2015  . Mild tricuspid insufficiency 02/24/2015  . 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 Controlled gout 02/24/2015  . Depression, major, recurrent, mild ([place]) 02/24/2015  . Morbid obesity due to excess calories ([place]) 02/24/2015  . Mild pulmonary hypertension ([place]) 02/24/2015  . Gastroesophageal reflux disease without esophagitis 02/24/2015  . Perennial allergic rhinitis 02/24/2015  . HLD (hyperlipidemia) 02/20/2015  . Hypertension, benign 02/20/2015  . Apnea, sleep 02/20/2015  . Controlled diabetes mellitus with stage 3 chronic kidney disease, without long-term current use of insulin ([place]) 02/20/2015    Past Surgical History:  Procedure Laterality Date  . ABDOMINAL HYSTERECTOMY     total  . CATARACT EXTRACTION    . CATARACT EXTRACTION W/PHACO Left 03/24/2019   Procedure: CATARACT EXTRACTION PHACO AND INTRAOCULAR LENS PLACEMENT ([place])  LEFT DIABETIC;  Surgeon: Eulogio Bear, MD;  Location: [place];  Service: Ophthalmology;  Laterality: Left;  Diabetic - insulin and oral meds sleep apnea  .  COLONOSCOPY  01/2014   sigmoid diverticulosis. desc colon TA, hyperplastic polyp  . COLONOSCOPY WITH PROPOFOL N/A 09/02/2018   Procedure: COLONOSCOPY WITH PROPOFOL;  Surgeon: Lucilla Lame, MD;  Location: [place];  Service: Endoscopy;   Laterality: N/A;  . CORONARY ANGIOPLASTY WITH STENT PLACEMENT    . ESOPHAGOGASTRODUODENOSCOPY N/A 02/16/2015   negative h pylori, focal gastic intestinal metaplasia  . ESOPHAGOGASTRODUODENOSCOPY (EGD) WITH PROPOFOL N/A 09/02/2018   Procedure: ESOPHAGOGASTRODUODENOSCOPY (EGD) WITH BIOPSIES;  Surgeon: Lucilla Lame, MD;  Location: [place];  Service: Endoscopy;  Laterality: N/A;  diabetic -insulin and oral meds sleep apnea  . TOTAL KNEE ARTHROPLASTY Right     Family History  Problem Relation Age of Onset  . Stroke Sister   . Hyperlipidemia Sister   . Alcohol abuse Brother   . Diabetes Brother   . Stroke Brother   . Hypertension Mother   . Diabetes Mother   . Heart disease Mother   . Diabetes Father   . Heart disease Father   . Hypertension Father   . Hypertension Sister   . Cancer Maternal Grandmother        Unsure  . Diabetes Maternal Grandfather   . Colon cancer Neg Hx   . Liver disease Neg Hx   . Breast cancer Neg Hx     Social History   Tobacco Use  . Smoking status: Former Smoker    Packs/day: 1.00    Years: 40.00    Pack years: 40.00    Types: Cigarettes    Start date: 09/04/1972    Quit date: 12/30/2012    Years since quitting: 6.9  . Smokeless tobacco: Never Used  . Tobacco comment: smoking cessation materials not required  Substance Use Topics  . Alcohol use: No    Alcohol/week: 0.0 standard drinks    Current Outpatient Medications:  .  ACCU-CHEK FASTCLIX LANCETS MISC, , Disp: , Rfl:  .  ACCU-CHEK SMARTVIEW test strip, Check fsbs two times daily  DMII, Disp: 100 each, Rfl: 6 .  Alcohol Swabs (B-D SINGLE USE SWABS REGULAR) PADS, 1 each by Does not apply route 4 (four) times daily., Disp: 200 each, Rfl: 2 .  allopurinol (ZYLOPRIM) 100 MG tablet, Take 1 tablet (100 mg total) by mouth daily., Disp: 90 tablet, Rfl: 1 .  amLODipine (NORVASC) 5 MG tablet, Take 1 tablet (5 mg total) by mouth daily., Disp: 90 tablet, Rfl: 1 .  aspirin EC 81 MG tablet, Take  81 mg by mouth every morning. , Disp: , Rfl:  .  BD INSULIN SYRINGE U/F 31G X 5/16" 0.3 ML MISC, , Disp: , Rfl:  .  blood glucose meter kit and supplies KIT, Dispense based on patient and insurance preference. Use up to four times daily as directed. (FOR ICD-9 250.00, 250.01)., Disp: 1 each, Rfl: 0 .  Cholecalciferol (VITAMIN D-3 PO), Take 1,000 Units by mouth 2 (two) times a day., Disp: , Rfl:  .  CINNAMON PO, Take 1,000 mg by mouth daily., Disp: , Rfl:  .  cloNIDine (CATAPRES) 0.1 MG tablet, Take 1 tablet (0.1 mg total) by mouth every evening., Disp: 90 tablet, Rfl: 1 .  Coenzyme Q10 100 MG capsule, Take 1 capsule (100 mg total) by mouth daily., Disp: 90 capsule, Rfl: 1 .  DENTA 5000 PLUS 1.1 % CREA dental cream, , Disp: , Rfl:  .  DROPLET PEN NEEDLES 31G X 5 MM MISC, , Disp: , Rfl:  .  DUREZOL 0.05 % EMUL, , Disp: ,  Rfl:  .  Estradiol (ESTRACE VA), Place vaginally as needed., Disp: , Rfl:  .  Ferrous Sulfate (IRON) 325 (65 Fe) MG TABS, Take 325 mg by mouth 2 (two) times daily. Nature made brand-  Pt takes one tablet twice a day , Disp: , Rfl:  .  fluticasone (FLONASE) 50 MCG/ACT nasal spray, Place 2 sprays into both nostrils daily., Disp: 48 g, Rfl: 2 .  Fluticasone-Salmeterol (ADVAIR) 250-50 MCG/DOSE AEPB, Inhale 1 puff into the lungs 2 (two) times daily., Disp: 180 each, Rfl: 0 .  glipiZIDE (GLUCOTROL XL) 5 MG 24 hr tablet, Take 1 tablet by mouth daily., Disp: , Rfl:  .  Insulin Glargine (BASAGLAR KWIKPEN) 100 UNIT/ML SOPN, Inject 60 Units into the skin at bedtime., Disp: , Rfl:  .  Insulin Pen Needle (FIFTY50 PEN NEEDLES) 31G X 5 MM MISC, Use once daily, Disp: , Rfl:  .  isosorbide mononitrate (IMDUR) 60 MG 24 hr tablet, Take 1 tablet (60 mg total) by mouth daily., Disp: 90 tablet, Rfl: 1 .  Lancets 28G MISC, Use 1 each 2 (two) times daily accu chek. Diagnosis: E11.22, Disp: , Rfl:  .  lisinopril (ZESTRIL) 20 MG tablet, Take 1 tablet (20 mg total) by mouth daily., Disp: 90 tablet, Rfl: 1 .   loratadine (CLARITIN) 10 MG tablet, Take 1 tablet (10 mg total) by mouth daily., Disp: 90 tablet, Rfl: 1 .  metoCLOPramide (REGLAN) 5 MG tablet, TAKE 1-2 TABLETS (5-10 MG TOTAL) FOUR TIMES DAILY   BEFORE MEALS AND AT BEDTIME., Disp: 120 tablet, Rfl: 0 .  metoprolol succinate (TOPROL-XL) 50 MG 24 hr tablet, Take 1 tablet (50 mg total) by mouth daily., Disp: 90 tablet, Rfl: 3 .  montelukast (SINGULAIR) 10 MG tablet, Take 1 tablet (10 mg total) by mouth every evening., Disp: 90 tablet, Rfl: 1 .  pantoprazole (PROTONIX) 40 MG tablet, Take 1 tablet (40 mg total) by mouth every morning., Disp: 90 tablet, Rfl: 1 .  rosuvastatin (CRESTOR) 20 MG tablet, Take 1 tablet (20 mg total) by mouth daily., Disp: 90 tablet, Rfl: 1 .  SYNJARDY XR 12.01-999 MG TB24, Take 2 tablets by mouth daily., Disp: , Rfl:  .  triamcinolone cream (KENALOG) 0.1 %, Apply 1 application topically 2 (two) times daily., Disp: 30 g, Rfl: 0 .  Turmeric (QC TUMERIC COMPLEX) 500 MG CAPS, Take 500 mg by mouth daily., Disp: , Rfl:  .  venlafaxine XR (EFFEXOR-XR) 75 MG 24 hr capsule, Take one tablet a day., Disp: 90 capsule, Rfl: 1  Allergies  Allergen Reactions  . Codeine Other (See Comments)    Unknown reaction  . Contrast Media [Iodinated Diagnostic Agents] Itching  . Sulfa Antibiotics Itching    I personally reviewed active problem list, medication list, allergies, family history, social history, health maintenance with the patient/caregiver today.   ROS   Ten systems reviewed and is negative except as mentioned in HPI   Objective  Virtual encounter, vitals not obtained.  There is no height or weight on file to calculate BMI.  Physical Exam  Awake, alert and oriented   PHQ2/9: Depression screen [facility] 2/9 11/28/2019 11/13/2019 07/24/2019 05/06/2019 03/17/2019  Decreased Interest 0 0 0 1 0  Down, Depressed, Hopeless 0 0 0 0 0  PHQ - 2 Score 0 0 0 1 0  Altered sleeping 0 0 0 0 0  Tired, decreased energy 0 0 0 1 0  Change in  appetite 0 0 0 0 0  Feeling bad or failure  about yourself  0 0 0 0 0  Trouble concentrating 0 0 0 0 0  Moving slowly or fidgety/restless 0 0 0 0 0  Suicidal thoughts 0 0 0 0 0  PHQ-9 Score 0 0 0 2 0  Difficult doing work/chores - Not difficult at all - - -  Some recent data might be hidden   PHQ-2/9 Result is negative.    Fall Risk: Fall Risk  11/28/2019 11/13/2019 10/16/2019 05/06/2019 03/17/2019  Falls in the past year? 0 0 0 0 0  Number falls in past yr: 0 0 0 0 0  Injury with Fall? 0 0 0 0 0  Comment - - - - -  Risk for fall due to : - - - - -  Follow up - Falls evaluation completed - - -     Assessment & Plan  1. Subacute sinusitis, unspecified location  - azithromycin (ZITHROMAX) 500 MG tablet; Take 1 tablet (500 mg total) by mouth daily.  Dispense: 3 tablet; Refill: 0  I discussed the assessment and treatment plan with the patient. The patient was provided an opportunity to ask questions and all were answered. The patient agreed with the plan and demonstrated an understanding of the instructions.   The patient was advised to call back or seek an in-person evaluation if the symptoms worsen or if the condition fails to improve as anticipated.  I provided 15 minutes of non-face-to-face time during this encounter.  Loistine Chance, MD

## 2019-12-01 DIAGNOSIS — E119 Type 2 diabetes mellitus without complications: Secondary | ICD-10-CM | POA: Diagnosis not present

## 2019-12-01 LAB — HM DIABETES EYE EXAM

## 2019-12-03 ENCOUNTER — Encounter: Payer: Self-pay | Admitting: Family Medicine

## 2019-12-05 ENCOUNTER — Encounter: Payer: Self-pay | Admitting: *Deleted

## 2019-12-05 NOTE — Telephone Encounter (Signed)
This encounter was created in error - please disregard.

## 2019-12-08 ENCOUNTER — Encounter: Payer: Self-pay | Admitting: Family Medicine

## 2019-12-10 DIAGNOSIS — L851 Acquired keratosis [keratoderma] palmaris et plantaris: Secondary | ICD-10-CM | POA: Diagnosis not present

## 2019-12-10 DIAGNOSIS — B351 Tinea unguium: Secondary | ICD-10-CM | POA: Diagnosis not present

## 2019-12-10 DIAGNOSIS — I700 Atherosclerosis of aorta: Secondary | ICD-10-CM | POA: Diagnosis not present

## 2019-12-10 DIAGNOSIS — E1143 Type 2 diabetes mellitus with diabetic autonomic (poly)neuropathy: Secondary | ICD-10-CM | POA: Diagnosis not present

## 2019-12-11 ENCOUNTER — Ambulatory Visit (INDEPENDENT_AMBULATORY_CARE_PROVIDER_SITE_OTHER): Payer: Medicare HMO

## 2019-12-11 ENCOUNTER — Other Ambulatory Visit: Payer: Self-pay

## 2019-12-11 VITALS — BP 108/64 | HR 77 | Temp 95.7°F | Resp 16 | Ht 66.0 in | Wt 218.1 lb

## 2019-12-11 DIAGNOSIS — Z1231 Encounter for screening mammogram for malignant neoplasm of breast: Secondary | ICD-10-CM

## 2019-12-11 DIAGNOSIS — M8580 Other specified disorders of bone density and structure, unspecified site: Secondary | ICD-10-CM

## 2019-12-11 DIAGNOSIS — Z0000 Encounter for general adult medical examination without abnormal findings: Secondary | ICD-10-CM | POA: Diagnosis not present

## 2019-12-11 DIAGNOSIS — Z780 Asymptomatic menopausal state: Secondary | ICD-10-CM

## 2019-12-11 NOTE — Progress Notes (Signed)
Subjective:   Dana Sparks is a 68 y.o. female who presents for an Initial Medicare Annual Wellness Visit.  Review of Systems      Cardiac Risk Factors include: advanced age (>110mn, >>38women);hypertension;dyslipidemia;diabetes mellitus;obesity (BMI >30kg/m2)     Objective:    Today's Vitals   12/11/19 0859  BP: 108/64  Pulse: 77  Resp: 16  Temp: (!) 95.7 F (35.4 C)  TempSrc: Temporal  SpO2: 97%  Weight: 218 lb 1.6 oz (98.9 kg)  Height: '5\' 6"'  (1.676 m)   Body mass index is 35.2 kg/m.  Advanced Directives 12/11/2019 03/24/2019 09/02/2018 08/16/2018 07/15/2018 01/04/2018 06/04/2017  Does Patient Have a Medical Advance Directive? No No No No No No No  Type of Advance Directive - - - - - - -  Does patient want to make changes to medical advance directive? - - - - - - -  [place]? - - - - - - -  Would patient like information on creating a medical advance directive? Yes (MAU/Ambulatory/Procedural Areas - Information given) No - Patient declined No - Patient declined No - Patient declined No - Patient declined Yes (MAU/Ambulatory/Procedural Areas - Information given) -    Current Medications (verified) Outpatient Encounter Medications as of 12/11/2019  Medication Sig  . ACCU-CHEK FASTCLIX LANCETS MISC   . ACCU-CHEK SMARTVIEW test strip Check fsbs two times daily  DMII  . Alcohol Swabs (B-D SINGLE USE SWABS REGULAR) PADS 1 each by Does not apply route 4 (four) times daily.  .Marland Kitchenallopurinol (ZYLOPRIM) 100 MG tablet Take 1 tablet (100 mg total) by mouth daily.  .Marland KitchenamLODipine (NORVASC) 5 MG tablet Take 1 tablet (5 mg total) by mouth daily.  .Marland Kitchenaspirin EC 81 MG tablet Take 81 mg by mouth every morning.   . BD INSULIN SYRINGE U/F 31G X 5/16" 0.3 ML MISC   . blood glucose meter kit and supplies KIT Dispense based on patient and insurance preference. Use up to four times daily as directed. (FOR ICD-9 250.00, 250.01).  . Cholecalciferol (VITAMIN D-3 PO)  Take 1,000 Units by mouth daily.   .Marland KitchenCINNAMON PO Take 1,000 mg by mouth daily.  . cloNIDine (CATAPRES) 0.1 MG tablet Take 1 tablet (0.1 mg total) by mouth every evening.  . Coenzyme Q10 100 MG capsule Take 1 capsule (100 mg total) by mouth daily.  . DENTA 5000 PLUS 1.1 % CREA dental cream   . Dulaglutide (TRULICITY ) Inject into the skin. Inject SQ once weekly  . Ferrous Sulfate (IRON) 325 (65 Fe) MG TABS Take 325 mg by mouth daily. Nature made brand-  . fluticasone (FLONASE) 50 MCG/ACT nasal spray Place 2 sprays into both nostrils daily.  . Fluticasone-Salmeterol (ADVAIR) 250-50 MCG/DOSE AEPB Inhale 1 puff into the lungs 2 (two) times daily.  . Insulin Glargine (BASAGLAR KWIKPEN) 100 UNIT/ML SOPN Inject 60 Units into the skin at bedtime.  . Insulin Pen Needle (FIFTY50 PEN NEEDLES) 31G X 5 MM MISC Use once daily  . isosorbide mononitrate (IMDUR) 60 MG 24 hr tablet Take 1 tablet (60 mg total) by mouth daily.  . Lancets 28G MISC Use 1 each 2 (two) times daily accu chek. Diagnosis: E11.22  . lisinopril (ZESTRIL) 20 MG tablet Take 1 tablet (20 mg total) by mouth daily.  .Marland Kitchenloratadine (CLARITIN) 10 MG tablet Take 1 tablet (10 mg total) by mouth daily.  . metoCLOPramide (REGLAN) 5 MG tablet TAKE 1-2 TABLETS (5-10 MG TOTAL)  FOUR TIMES DAILY   BEFORE MEALS AND AT BEDTIME.  . metoprolol succinate (TOPROL-XL) 50 MG 24 hr tablet Take 1 tablet (50 mg total) by mouth daily.  . montelukast (SINGULAIR) 10 MG tablet Take 1 tablet (10 mg total) by mouth every evening.  . pantoprazole (PROTONIX) 40 MG tablet Take 1 tablet (40 mg total) by mouth every morning.  . rosuvastatin (CRESTOR) 20 MG tablet Take 1 tablet (20 mg total) by mouth daily.  Marland Kitchen SYNJARDY XR 12.01-999 MG TB24 Take 2 tablets by mouth daily.  . Turmeric (QC TUMERIC COMPLEX) 500 MG CAPS Take 500 mg by mouth daily.  Marland Kitchen venlafaxine XR (EFFEXOR-XR) 75 MG 24 hr capsule Take one tablet a day.  . [DISCONTINUED] DROPLET PEN NEEDLES 31G X 5 MM MISC   .  [DISCONTINUED] azithromycin (ZITHROMAX) 500 MG tablet Take 1 tablet (500 mg total) by mouth daily.  . [DISCONTINUED] DUREZOL 0.05 % EMUL   . [DISCONTINUED] Estradiol (ESTRACE VA) Place vaginally as needed.  . [DISCONTINUED] glipiZIDE (GLUCOTROL XL) 5 MG 24 hr tablet Take 1 tablet by mouth daily.  . [DISCONTINUED] triamcinolone cream (KENALOG) 0.1 % Apply 1 application topically 2 (two) times daily.   No facility-administered encounter medications on file as of 12/11/2019.    Allergies (verified) Codeine, Contrast media [iodinated diagnostic agents], and Sulfa antibiotics   History: Past Medical History:  Diagnosis Date  . Allergy   . Anxiety   . Arthritis    right knee  . CAD (coronary artery disease)   . COPD (chronic obstructive pulmonary disease) ([place])   . Depression   . Diabetes mellitus without complication ([place])    type 2  . Diverticulitis   . Gastroparesis   . GERD (gastroesophageal reflux disease)   . Gout   . Hyperlipemia   . Hypertension   . Positive H. pylori test   . Renal insufficiency   . Sleep apnea    CPAP  . Thrombocytosis ([place]) 01/28/2015  . Tubular adenoma of colon 01/20/14   Past Surgical History:  Procedure Laterality Date  . ABDOMINAL HYSTERECTOMY     total  . CATARACT EXTRACTION    . CATARACT EXTRACTION W/PHACO Left 03/24/2019   Procedure: CATARACT EXTRACTION PHACO AND INTRAOCULAR LENS PLACEMENT ([place])  LEFT DIABETIC;  Surgeon: Eulogio Bear, MD;  Location: [facility];  Service: Ophthalmology;  Laterality: Left;  Diabetic - insulin and oral meds sleep apnea  . COLONOSCOPY  01/2014   sigmoid diverticulosis. desc colon TA, hyperplastic polyp  . COLONOSCOPY WITH PROPOFOL N/A 09/02/2018   Procedure: COLONOSCOPY WITH PROPOFOL;  Surgeon: Lucilla Lame, MD;  Location: [facility];  Service: Endoscopy;  Laterality: N/A;  . CORONARY ANGIOPLASTY WITH STENT PLACEMENT    . ESOPHAGOGASTRODUODENOSCOPY N/A 02/16/2015   negative h pylori, focal  gastic intestinal metaplasia  . ESOPHAGOGASTRODUODENOSCOPY (EGD) WITH PROPOFOL N/A 09/02/2018   Procedure: ESOPHAGOGASTRODUODENOSCOPY (EGD) WITH BIOPSIES;  Surgeon: Lucilla Lame, MD;  Location: [facility];  Service: Endoscopy;  Laterality: N/A;  diabetic -insulin and oral meds sleep apnea  . TOTAL KNEE ARTHROPLASTY Right    Family History  Problem Relation Age of Onset  . Stroke Sister   . Hyperlipidemia Sister   . Alcohol abuse Brother   . Diabetes Brother   . Stroke Brother   . Hypertension Mother   . Diabetes Mother   . Heart disease Mother   . Diabetes Father   . Heart disease Father   . Hypertension Father   . Hypertension Sister   .  Cancer Maternal Grandmother        Unsure  . Diabetes Maternal Grandfather   . Colon cancer Neg Hx   . Liver disease Neg Hx   . Breast cancer Neg Hx    Social History   Socioeconomic History  . Marital status: Married    Spouse name: Fritz Pickerel  . Number of children: 2  . Years of education: some college  . Highest education level: 12th grade  Occupational History  . Occupation: Disabled  Tobacco Use  . Smoking status: Former Smoker    Packs/day: 1.00    Years: 40.00    Pack years: 40.00    Types: Cigarettes    Start date: 09/04/1972    Quit date: 12/30/2012    Years since quitting: 6.9  . Smokeless tobacco: Never Used  . Tobacco comment: smoking cessation materials not required  Substance and Sexual Activity  . Alcohol use: No    Alcohol/week: 0.0 standard drinks  . Drug use: No  . Sexual activity: Not Currently  Other Topics Concern  . Not on file  Social History Narrative   Married - current 44rd,    Two children from first marriage   He has 4 children from previous marriage   Social Determinants of Health   Financial Resource Strain: Low Risk   . Difficulty of Paying Living Expenses: Not very hard  Food Insecurity: No Food Insecurity  . Worried About Charity fundraiser in the Last Year: Never true  . Ran Out of  Food in the Last Year: Never true  Transportation Needs: No Transportation Needs  . Lack of Transportation (Medical): No  . Lack of Transportation (Non-Medical): No  Physical Activity: Sufficiently Active  . Days of Exercise per Week: 4 days  . Minutes of Exercise per Session: 60 min  Stress: No Stress Concern Present  . Feeling of Stress : Not at all  Social Connections: Not Isolated  . Frequency of Communication with Friends and Family: More than three times a week  . Frequency of Social Gatherings with Friends and Family: More than three times a week  . Attends Religious Services: More than 4 times per year  . Active Member of Clubs or Organizations: Yes  . Attends Archivist Meetings: More than 4 times per year  . Marital Status: Married    Tobacco Counseling Counseling given: Not Answered Comment: smoking cessation materials not required   Clinical Intake:  Pre-visit preparation completed: Yes  Pain : No/denies pain     BMI - recorded: 35.2 Nutritional Status: BMI > 30  Obese Nutritional Risks: None Diabetes: Yes CBG done?: No Did pt. bring in CBG monitor from home?: No   Nutrition Risk Assessment:  Has the patient had any N/V/D within the last 2 months?  No  Does the patient have any non-healing wounds?  No  Has the patient had any unintentional weight loss or weight gain?  No   Diabetes:  Is the patient diabetic?  Yes  If diabetic, was a CBG obtained today?  No  Did the patient bring in their glucometer from home?  No  How often do you monitor your CBG's? Twice daily.   Financial Strains and Diabetes Management:  Are you having any financial strains with the device, your supplies or your medication? No .  Does the patient want to be seen by Chronic Care Management for management of their diabetes?  No  Would the patient like to be referred to a  Nutritionist or for Diabetic Management?  No   Diabetic Exams:  Diabetic Eye Exam: Completed  12/01/19 negative retinopathy.   Diabetic Foot Exam: Completed 05/19/19.   How often do you need to have someone help you when you read instructions, pamphlets, or other written materials from your doctor or pharmacy?: 1 - Never  Interpreter Needed?: No  Information entered by :: [name] LPN   Activities of Daily Living In your present state of health, do you have any difficulty performing the following activities: 12/11/2019 11/28/2019  Hearing? N N  Comment declines hearing aids -  Vision? N N  Difficulty concentrating or making decisions? N N  Walking or climbing stairs? N N  Dressing or bathing? N N  Doing errands, shopping? N N  Preparing Food and eating ? N -  Using the Toilet? N -  In the past six months, have you accidently leaked urine? N -  Do you have problems with loss of bowel control? N -  Managing your Medications? N -  Managing your Finances? N -  Housekeeping or managing your Housekeeping? N -  Some recent data might be hidden     Immunizations and Health Maintenance Immunization History  Administered Date(s) Administered  . Fluad Quad(high Dose 65+) 05/06/2019  . Influenza, High Dose Seasonal PF 05/23/2017, 06/04/2018  . Influenza,inj,Quad PF,6+ Mos 06/28/2015, 06/13/2016  . Influenza-Unspecified 06/28/2015, 06/13/2016  . Moderna SARS-COVID-2 Vaccination 09/29/2019, 10/27/2019  . Pneumococcal Conjugate-13 07/21/2014  . Pneumococcal Polysaccharide-23 06/02/2009, 11/10/2016  . Tdap 12/30/2009  . Zoster 07/18/2012   There are no preventive care reminders to display for this patient.  Patient Care Team: Steele Sizer, MD as PCP - General (Family Medicine) Lucilla Lame, MD as Consulting Physician (Gastroenterology) Yolonda Kida, MD as Consulting Physician (Cardiology) Gabriel Carina Betsey Holiday, MD as Physician Assistant (Endocrinology) Albertine Patricia, [place] as Attending Physician (Podiatry) Hessie Knows, MD as Consulting Physician (Orthopedic  Surgery) Lequita Asal, MD as Referring Physician (Hematology and Oncology) Cathi Roan, [facility] (Pharmacist)  Indicate any recent Medical Services you may have received from other than [facility] providers in the past year (date may be approximate).     Assessment:   This is a routine wellness examination for [facility].  Hearing/Vision screen  Hearing Screening   '125Hz'  '250Hz'  '500Hz'  '1000Hz'  '2000Hz'  '3000Hz'  '4000Hz'  '6000Hz'  '8000Hz'   Right ear:           Left ear:           Comments: Pt denies hearing difficulty  Vision Screening Comments: Annual vision screenings done at [facility] issues and exercise activities discussed: Current Exercise Habits: Home exercise routine, Type of exercise: walking, Time (Minutes): 60, Frequency (Times/Week): 4, Weekly Exercise (Minutes/Week): 240, Intensity: Moderate, Exercise limited by: None identified  Goals    . DIET - INCREASE WATER INTAKE     Recommend to drink at least 6-8 8oz glasses of water per day.    . I am having trouble affording some of my medicines (pt-stated)     Current Barriers:  . financial  Pharmacist Clinical Goal(s): Over the next 14 days, [name] will provide the necessary supplementary documents (proof of out of pocket prescription expenditure, proof of household income) needed for medication assistance applications to [facility] pharmacist.   Interventions: . Patient to apply for medication assistance for 2021 cycle for Basaglar [employer]) and Synjardy [employer]) through [employer] office as Dr. Gabriel Carina is the prescriber o Updated 1/26: patient requesting that pharmacist  contact Dr. Tonie Griffith   Patient Self Care Activities:  Marland Kitchen Gather necessary documents needed to apply for medication assistance  Please see past updates related to this goal by clicking on the "Past Updates" button in the selected goal        Depression Screen PHQ 2/9 Scores 12/11/2019 11/28/2019 11/13/2019 07/24/2019 05/06/2019  03/17/2019 02/12/2019  PHQ - 2 Score 0 0 0 0 1 0 0  PHQ- 9 Score - 0 0 0 2 0 0  Exception Documentation - - - - - - -    Fall Risk Fall Risk  12/11/2019 11/28/2019 11/13/2019 10/16/2019 05/06/2019  Falls in the past year? 0 0 0 0 0  Number falls in past yr: 0 0 0 0 0  Injury with Fall? 0 0 0 0 0  Comment - - - - -  Risk for fall due to : No Fall Risks - - - -  Follow up Falls prevention discussed - Falls evaluation completed - -    FALL RISK PREVENTION PERTAINING TO THE HOME:  Any stairs in or around the home? Yes  If so, do they handrails? Yes   Home free of loose throw rugs in walkways, pet beds, electrical cords, etc? Yes  Adequate lighting in your home to reduce risk of falls? Yes   ASSISTIVE DEVICES UTILIZED TO PREVENT FALLS:  Life alert? No  Use of a cane, walker or w/c? No  Grab bars in the bathroom? No  Shower chair or bench in shower? No  Elevated toilet seat or a handicapped toilet? Yes   DME ORDERS:  DME order needed?  No   TIMED UP AND GO:  Was the test performed? Yes .  Length of time to ambulate 10 feet: 5 sec.   GAIT:  Appearance of gait: Gait stead-fast and without the use of an assistive device.   Education: Fall risk prevention has been discussed.  Intervention(s) required? No   Cognitive Function: MMSE - Mini Mental State Exam 02/06/2019  Orientation to time 5  Orientation to Place 5  Registration 3  Attention/ Calculation 5  Recall 3  Language- name 2 objects 2  Language- repeat 1  Language- follow 3 step command 3  Language- read & follow direction 1  Write a sentence 1  Copy design 1  Total score 30     6CIT Screen 12/11/2019 01/15/2019 01/04/2018  What Year? 0 points 0 points 0 points  What month? 0 points 0 points 0 points  What time? 0 points 0 points 0 points  Count back from 20 0 points 0 points 0 points  Months in reverse 0 points 4 points 0 points  Repeat phrase 4 points 4 points 4 points  Total Score '4 8 4    ' Screening  Tests Health Maintenance  Topic Date Due  . TETANUS/TDAP  12/31/2019  . HEMOGLOBIN A1C  02/05/2020  . INFLUENZA VACCINE  04/04/2020  . MAMMOGRAM  04/20/2020  . FOOT EXAM  05/18/2020  . OPHTHALMOLOGY EXAM  11/30/2020  . COLONOSCOPY  09/03/2023  . DEXA SCAN  Completed  . Hepatitis C Screening  Completed  . PNA vac Low Risk Adult  Completed    Qualifies for Shingles Vaccine? Yes  Zostavax completed 2013. Due for Shingrix. Education has been provided regarding the importance of this vaccine. Pt has been advised to call insurance company to determine out of pocket expense. Advised may also receive vaccine at local pharmacy or Health Dept. Verbalized acceptance and understanding.  Tdap: Up to date  Flu Vaccine: Up to date  Pneumococcal Vaccine: Up to date    Cancer Screenings:  Colorectal Screening: Completed 09/02/18. Repeat every 5 years;   Mammogram: Completed 04/21/19. Repeat every year; Ordered today. Pt provided with contact information and advised to call to schedule appt.   Bone Density: Completed 04/17/17. Results reflect  OSTEOPENIA. Repeat every 2 years. Ordered today. Pt provided with contact information and advised to call to schedule appt.   Lung Cancer Screening: (Low Dose CT Chest recommended if Age 45-80 years, 30 pack-year currently smoking OR have quit w/in 15years.) does qualify. Completed 03/10/19.   Additional Screening:  Hepatitis C Screening: does qualify; Completed 01/20/13   Vision Screening: Recommended annual ophthalmology exams for early detection of glaucoma and other disorders of the eye. Is the patient up to date with their annual eye exam?  Yes  Who is the provider or what is the name of the office in which the pt attends annual eye exams? [facility] Screening: Recommended annual dental exams for proper oral hygiene  Community Resource Referral:  CRR required this visit?  No     Plan:    I have personally reviewed and  addressed the Medicare Annual Wellness questionnaire and have noted the following in the patient's chart:  A. Medical and social history B. Use of alcohol, tobacco or illicit drugs  C. Current medications and supplements D. Functional ability and status E.  Nutritional status F.  Physical activity G. Advance directives H. List of other physicians I.  Hospitalizations, surgeries, and ER visits in previous 12 months [place] such as hearing and vision if needed, cognitive and depression L. Referrals and appointments   In addition, I have reviewed and discussed with patient certain preventive protocols, quality metrics, and best practice recommendations. A written personalized care plan for preventive services as well as general preventive health recommendations were provided to patient.   Signed,  [name], LPN Nurse Health Advisor   Nurse Notes: none

## 2019-12-11 NOTE — Patient Instructions (Signed)
Dana Sparks , Thank you for taking time to come for your Medicare Wellness Visit. I appreciate your ongoing commitment to your health goals. Please review the following plan we discussed and let me know if I can assist you in the future.   Screening recommendations/referrals: Colonoscopy: done 09/02/18. Repeat in 2024. Mammogram: done 04/21/19. Please call [phone] to schedule your mammogram and bone density screening.  Bone Density: done 04/17/17 Recommended yearly ophthalmology/optometry visit for glaucoma screening and checkup Recommended yearly dental visit for hygiene and checkup  Vaccinations: Influenza vaccine: done 05/06/19 Pneumococcal vaccine: done 11/10/16 Tdap vaccine: done 12/30/09 Shingles vaccine: Shingrix discussed. Please contact your pharmacy for coverage information.  Covid-19: done 09/29/19 & 10/27/19  Advanced directives: Advance directive discussed with you today. I have provided a copy for you to complete at home and have notarized. Once this is complete please bring a copy in to our office so we can scan it into your chart.  Conditions/risks identified: Keep up the great work!  Next appointment: Please follow up in one year for your Medicare Annual Wellness visit.     Preventive Care 69 Years and Older, Female Preventive care refers to lifestyle choices and visits with your health care provider that can promote health and wellness. What does preventive care include?  A yearly physical exam. This is also called an annual well check.  Dental exams once or twice a year.  Routine eye exams. Ask your health care provider how often you should have your eyes checked.  Personal lifestyle choices, including:  Daily care of your teeth and gums.  Regular physical activity.  Eating a healthy diet.  Avoiding tobacco and drug use.  Limiting alcohol use.  Practicing safe sex.  Taking low-dose aspirin every day.  Taking vitamin and mineral supplements as recommended  by your health care provider. What happens during an annual well check? The services and screenings done by your health care provider during your annual well check will depend on your age, overall health, lifestyle risk factors, and family history of disease. Counseling  Your health care provider may ask you questions about your:  Alcohol use.  Tobacco use.  Drug use.  Emotional well-being.  Home and relationship well-being.  Sexual activity.  Eating habits.  History of falls.  Memory and ability to understand (cognition).  Work and work Statistician.  Reproductive health. Screening  You may have the following tests or measurements:  Height, weight, and BMI.  Blood pressure.  Lipid and cholesterol levels. These may be checked every 5 years, or more frequently if you are over 82 years old.  Skin check.  Lung cancer screening. You may have this screening every year starting at age 69 if you have a 30-pack-year history of smoking and currently smoke or have quit within the past 15 years.  Fecal occult blood test (FOBT) of the stool. You may have this test every year starting at age 88.  Flexible sigmoidoscopy or colonoscopy. You may have a sigmoidoscopy every 5 years or a colonoscopy every 10 years starting at age 69.  Hepatitis C blood test.  Hepatitis B blood test.  Sexually transmitted disease (STD) testing.  Diabetes screening. This is done by checking your blood sugar (glucose) after you have not eaten for a while (fasting). You may have this done every 1-3 years.  Bone density scan. This is done to screen for osteoporosis. You may have this done starting at age 69.  Mammogram. This may be done every 1-2 years.  Talk to your health care provider about how often you should have regular mammograms. Talk with your health care provider about your test results, treatment options, and if necessary, the need for more tests. Vaccines  Your health care provider may  recommend certain vaccines, such as:  Influenza vaccine. This is recommended every year.  Tetanus, diphtheria, and acellular pertussis (Tdap, Td) vaccine. You may need a Td booster every 10 years.  Zoster vaccine. You may need this after age 67.  Pneumococcal 13-valent conjugate (PCV13) vaccine. One dose is recommended after age 69.  Pneumococcal polysaccharide (PPSV23) vaccine. One dose is recommended after age 69. Talk to your health care provider about which screenings and vaccines you need and how often you need them. This information is not intended to replace advice given to you by your health care provider. Make sure you discuss any questions you have with your health care provider. Document Released: 09/17/2015 Document Revised: 05/10/2016 Document Reviewed: 06/22/2015 Elsevier Interactive Patient Education  2017 [place] Prevention in the Home Falls can cause injuries. They can happen to people of all ages. There are many things you can do to make your home safe and to help prevent falls. What can I do on the outside of my home?  Regularly fix the edges of walkways and driveways and fix any cracks.  Remove anything that might make you trip as you walk through a door, such as a raised step or threshold.  Trim any bushes or trees on the path to your home.  Use bright outdoor lighting.  Clear any walking paths of anything that might make someone trip, such as rocks or tools.  Regularly check to see if handrails are loose or broken. Make sure that both sides of any steps have handrails.  Any raised decks and porches should have guardrails on the edges.  Have any leaves, snow, or ice cleared regularly.  Use sand or salt on walking paths during winter.  Clean up any spills in your garage right away. This includes oil or grease spills. What can I do in the bathroom?  Use night lights.  Install grab bars by the toilet and in the tub and shower. Do not use towel  bars as grab bars.  Use non-skid mats or decals in the tub or shower.  If you need to sit down in the shower, use a plastic, non-slip stool.  Keep the floor dry. Clean up any water that spills on the floor as soon as it happens.  Remove soap buildup in the tub or shower regularly.  Attach bath mats securely with double-sided non-slip rug tape.  Do not have throw rugs and other things on the floor that can make you trip. What can I do in the bedroom?  Use night lights.  Make sure that you have a light by your bed that is easy to reach.  Do not use any sheets or blankets that are too big for your bed. They should not hang down onto the floor.  Have a firm chair that has side arms. You can use this for support while you get dressed.  Do not have throw rugs and other things on the floor that can make you trip. What can I do in the kitchen?  Clean up any spills right away.  Avoid walking on wet floors.  Keep items that you use a lot in easy-to-reach places.  If you need to reach something above you, use a strong step stool  that has a grab bar.  Keep electrical cords out of the way.  Do not use floor polish or wax that makes floors slippery. If you must use wax, use non-skid floor wax.  Do not have throw rugs and other things on the floor that can make you trip. What can I do with my stairs?  Do not leave any items on the stairs.  Make sure that there are handrails on both sides of the stairs and use them. Fix handrails that are broken or loose. Make sure that handrails are as long as the stairways.  Check any carpeting to make sure that it is firmly attached to the stairs. Fix any carpet that is loose or worn.  Avoid having throw rugs at the top or bottom of the stairs. If you do have throw rugs, attach them to the floor with carpet tape.  Make sure that you have a light switch at the top of the stairs and the bottom of the stairs. If you do not have them, ask someone to  add them for you. What else can I do to help prevent falls?  Wear shoes that:  Do not have high heels.  Have rubber bottoms.  Are comfortable and fit you well.  Are closed at the toe. Do not wear sandals.  If you use a stepladder:  Make sure that it is fully opened. Do not climb a closed stepladder.  Make sure that both sides of the stepladder are locked into place.  Ask someone to hold it for you, if possible.  Clearly mark and make sure that you can see:  Any grab bars or handrails.  First and last steps.  Where the edge of each step is.  Use tools that help you move around (mobility aids) if they are needed. These include:  Canes.  Walkers.  Scooters.  Crutches.  Turn on the lights when you go into a dark area. Replace any light bulbs as soon as they burn out.  Set up your furniture so you have a clear path. Avoid moving your furniture around.  If any of your floors are uneven, fix them.  If there are any pets around you, be aware of where they are.  Review your medicines with your doctor. Some medicines can make you feel dizzy. This can increase your chance of falling. Ask your doctor what other things that you can do to help prevent falls. This information is not intended to replace advice given to you by your health care provider. Make sure you discuss any questions you have with your health care provider. Document Released: 06/17/2009 Document Revised: 01/27/2016 Document Reviewed: 09/25/2014 Elsevier Interactive Patient Education  2017 [employer].

## 2019-12-15 ENCOUNTER — Other Ambulatory Visit: Payer: Self-pay

## 2019-12-15 ENCOUNTER — Other Ambulatory Visit: Payer: Self-pay | Admitting: Family Medicine

## 2019-12-15 DIAGNOSIS — E1143 Type 2 diabetes mellitus with diabetic autonomic (poly)neuropathy: Secondary | ICD-10-CM

## 2019-12-15 MED ORDER — METOCLOPRAMIDE HCL 5 MG PO TABS: ORAL_TABLET | ORAL | 0 refills | Status: DC

## 2019-12-15 NOTE — Telephone Encounter (Signed)
Requested medication (s) are due for refill today -yes  Requested medication (s) are on the active medication list -yes  Future visit scheduled -yes  Last refill: 07/24/19  Notes to clinic: Request for non delegated Rx  Requested Prescriptions  Pending Prescriptions Disp Refills   metoCLOPramide (REGLAN) 5 MG tablet [Pharmacy Med Name: METOCLOPRAMIDE HCL 5 MG Tablet] 120 tablet 0    Sig: TAKE 1 TO 2 TABLETS FOUR TIMES DAILY BEFORE MEALS  AND AT BEDTIME      Not Delegated - Gastroenterology: Antiemetics Failed - 12/15/2019  3:13 PM      Failed - This refill cannot be delegated      Passed - Valid encounter within last 6 months    Recent Outpatient Visits           2 weeks ago Subacute sinusitis, unspecified location   [facility] Steele Sizer, MD   1 month ago Diabetes mellitus with gastroparesis [facility])   [facility] Steele Sizer, MD   2 months ago Simple chronic bronchitis [facility])   [facility] Steele Sizer, MD   4 months ago Simple chronic bronchitis [facility])   [facility], Drue Stager, MD   7 months ago Diabetes mellitus with gastroparesis [facility])   [facility] Steele Sizer, MD       Future Appointments             In 2 months Ancil Boozer, Drue Stager, MD [facility], [place]   In 1 year  [facility]                Requested Prescriptions  Pending Prescriptions Disp Refills   metoCLOPramide (REGLAN) 5 MG tablet [Pharmacy Med Name: METOCLOPRAMIDE HCL 5 MG Tablet] 120 tablet 0    Sig: TAKE 1 TO 2 TABLETS FOUR TIMES DAILY BEFORE MEALS  AND AT BEDTIME      Not Delegated - Gastroenterology: Antiemetics Failed - 12/15/2019  3:13 PM      Failed - This refill cannot be delegated      Passed - Valid encounter within last 6 months    Recent Outpatient Visits           2 weeks ago Subacute sinusitis, unspecified location   [facility] Steele Sizer, MD   1 month ago Diabetes mellitus with gastroparesis [facility])   [facility] Steele Sizer, MD   2 months ago Simple chronic bronchitis [facility])   [facility] Steele Sizer, MD   4 months ago Simple chronic bronchitis [facility])   [facility] Steele Sizer, MD   7 months ago Diabetes mellitus with gastroparesis [facility])   [facility] Steele Sizer, MD       Future Appointments             In 2 months Ancil Boozer, Drue Stager, MD [facility], [facility]   In 1 year  [facility], [facility]

## 2019-12-15 NOTE — Telephone Encounter (Signed)
Pt has been out of her medication for "a while" she called to report that she has been "sick on her stomach since Friday." Pt is hoping to receive refill soon.

## 2019-12-15 NOTE — Telephone Encounter (Signed)
Please send to two different pharmacies.

## 2019-12-16 ENCOUNTER — Other Ambulatory Visit: Payer: Self-pay | Admitting: Family Medicine

## 2019-12-16 DIAGNOSIS — E1143 Type 2 diabetes mellitus with diabetic autonomic (poly)neuropathy: Secondary | ICD-10-CM

## 2019-12-16 NOTE — Telephone Encounter (Signed)
She is having abdominal pain and needs her medication asap.

## 2019-12-16 NOTE — Addendum Note (Signed)
Addended by: Chilton Greathouse on: 12/16/2019 02:08 PM   Modules accepted: Orders

## 2019-12-16 NOTE — Telephone Encounter (Signed)
Requested medication (s) are due for refill today: no  Requested medication (s) are on the active medication list: yes  Last refill:  12/15/19  Future visit scheduled: yes  Notes to clinic:  [facility]- [facility] went to [facility].- Please review- not delegated to NT   Requested Prescriptions  Pending Prescriptions Disp Refills   metoCLOPramide (REGLAN) 5 MG tablet [Pharmacy Med Name: METOCLOPRAMIDE 5 MG TABLET] 120 tablet 0    Sig: TAKE 1-2 TABLETS (5-10 MG TOTAL) BY MOUTH 4 (FOUR) TIMES DAILY - BEFORE MEALS AND AT BEDTIME.      Not Delegated - Gastroenterology: Antiemetics Failed - 12/16/2019  3:23 PM      Failed - This refill cannot be delegated      Passed - Valid encounter within last 6 months    Recent Outpatient Visits           2 weeks ago Subacute sinusitis, unspecified location   [facility] Steele Sizer, MD   1 month ago Diabetes mellitus with gastroparesis [facility])   [facility] Steele Sizer, MD   2 months ago Simple chronic bronchitis [facility])   [facility] Sizer, MD   4 months ago Simple chronic bronchitis [facility])   [facility] Steele Sizer, MD   7 months ago Diabetes mellitus with gastroparesis [facility])   [facility] Steele Sizer, MD       Future Appointments             In 2 months Ancil Boozer, Drue Stager, MD [facility], [place]   In 12 months  [facility], [facility]

## 2019-12-16 NOTE — Addendum Note (Signed)
Addended by: Steele Sizer F on: 12/16/2019 05:44 PM   Modules accepted: Orders

## 2019-12-16 NOTE — Telephone Encounter (Signed)
Pt called for an update on [facility]. Pt stated she is experiencing lots of discomfort and needs the [facility] until she receives the Rx from mail order. Pt requests call back

## 2019-12-17 DIAGNOSIS — G4733 Obstructive sleep apnea (adult) (pediatric): Secondary | ICD-10-CM | POA: Diagnosis not present

## 2019-12-23 ENCOUNTER — Ambulatory Visit: Payer: Self-pay | Admitting: *Deleted

## 2019-12-23 DIAGNOSIS — E1142 Type 2 diabetes mellitus with diabetic polyneuropathy: Secondary | ICD-10-CM | POA: Diagnosis not present

## 2019-12-23 DIAGNOSIS — E1165 Type 2 diabetes mellitus with hyperglycemia: Secondary | ICD-10-CM | POA: Diagnosis not present

## 2019-12-23 DIAGNOSIS — Z794 Long term (current) use of insulin: Secondary | ICD-10-CM | POA: Diagnosis not present

## 2019-12-23 DIAGNOSIS — E1159 Type 2 diabetes mellitus with other circulatory complications: Secondary | ICD-10-CM | POA: Diagnosis not present

## 2019-12-23 DIAGNOSIS — R112 Nausea with vomiting, unspecified: Secondary | ICD-10-CM | POA: Diagnosis not present

## 2019-12-23 DIAGNOSIS — I10 Essential (primary) hypertension: Secondary | ICD-10-CM | POA: Diagnosis not present

## 2019-12-23 NOTE — Telephone Encounter (Signed)
Patient is calling to let PCP know that she is having a problem with her eating/drinking. She has been vomiting after meals for at least 1 week now. Patient states her glucose is low because she is not getting food to stay down. Patient states it feels like food is sitting in her throat- not choking- just not going down. She states she has tried OTC reflux medication without success. Advised patient ED is disposition for this because she may need treatment for dehydration and evaluation of her symptoms.Patient has appointment today with endocrinologist today at 10:30. She states she is going to that appointment and will call PCP afterward- advised her to please follow there recommendation if they advised her to go to ED. She will be calling after the appointment with report- their advisement.  Reason for Disposition . [1] MODERATE vomiting (e.g., 3 - 5 times/day) AND [2] age > 35  Answer Assessment - Initial Assessment Questions 1. VOMITING SEVERITY: "How many times have you vomited in the past 24 hours?"     - MILD:  1 - 2 times/day    - MODERATE: 3 - 5 times/day, decreased oral intake without significant weight loss or symptoms of dehydration    - SEVERE: 6 or more times/day, vomits everything or nearly everything, with significant weight loss, symptoms of dehydration      Yesterday- at least 4 times 2. ONSET: "When did the vomiting begin?"      Almost 1 week 3. FLUIDS: "What fluids or food have you vomited up today?" "Have you been able to keep any fluids down?"     Trouble keeping anything down- feels like food is not going down- patient will vomit within 3-4 hours after eating 4. ABDOMINAL PAIN: "Are your having any abdominal pain?" If yes : "How bad is it and what does it feel like?" (e.g., crampy, dull, intermittent, constant)      Sometimes- sore stomach muscles 5. DIARRHEA: "Is there any diarrhea?" If so, ask: "How many times today?"      No- less BM 6. CONTACTS: "Is there anyone else in  the family with the same symptoms?"      no 7. CAUSE: "What do you think is causing your vomiting?"     Possible reaction to medication- may have started before started medication 8. HYDRATION STATUS: "Any signs of dehydration?" (e.g., dry mouth [not only dry lips], too weak to stand) "When did you last urinate?"     this morning 9. OTHER SYMPTOMS: "Do you have any other symptoms?" (e.g., fever, headache, vertigo, vomiting blood or coffee grounds, recent head injury)     no 10. PREGNANCY: "Is there any chance you are pregnant?" "When was your last menstrual period?"       n/a  Protocols used: [facility]

## 2019-12-25 ENCOUNTER — Other Ambulatory Visit: Payer: Self-pay | Admitting: Family Medicine

## 2019-12-25 DIAGNOSIS — J3089 Other allergic rhinitis: Secondary | ICD-10-CM

## 2019-12-29 ENCOUNTER — Telehealth: Payer: Self-pay | Admitting: Family Medicine

## 2019-12-29 NOTE — Chronic Care Management (AMB) (Signed)
  Care Management   Note  12/29/2019 Name: Dana Sparks MRN: [id number] DOB: Dec 31, 1950  [name] is a 69 y.o. year old female who is a primary care patient of Steele Sizer, MD and is actively engaged with the care management team. I reached out to [name] by phone today to assist with scheduling an initial visit with the Pharmacist  Follow up plan: Telephone appointment with care management team member scheduled for:01/06/2020  Noreene Larsson, [address], [place], [place] [id number] Direct Dial: [phone] [email or web address] Website: [email or web address]

## 2019-12-31 ENCOUNTER — Other Ambulatory Visit: Payer: Self-pay | Admitting: Emergency Medicine

## 2019-12-31 DIAGNOSIS — Z79899 Other long term (current) drug therapy: Secondary | ICD-10-CM

## 2020-01-02 ENCOUNTER — Telehealth: Payer: Self-pay

## 2020-01-02 ENCOUNTER — Other Ambulatory Visit: Payer: Self-pay | Admitting: Family Medicine

## 2020-01-02 NOTE — Telephone Encounter (Signed)
Patient called she would like orders sent in for Bone Density and Mammogram.

## 2020-01-05 NOTE — Addendum Note (Signed)
Addended by: [name] D on: 01/05/2020 08:02 AM   Modules accepted: Orders

## 2020-01-05 NOTE — Telephone Encounter (Signed)
Orders placed in [facility] encounter. Thank you!

## 2020-01-06 ENCOUNTER — Ambulatory Visit: Payer: Medicare HMO | Admitting: Pharmacist

## 2020-01-06 ENCOUNTER — Other Ambulatory Visit: Payer: Self-pay | Admitting: Family Medicine

## 2020-01-06 ENCOUNTER — Other Ambulatory Visit: Payer: Self-pay

## 2020-01-06 DIAGNOSIS — E1143 Type 2 diabetes mellitus with diabetic autonomic (poly)neuropathy: Secondary | ICD-10-CM

## 2020-01-06 DIAGNOSIS — I10 Essential (primary) hypertension: Secondary | ICD-10-CM

## 2020-01-06 MED ORDER — ANORO ELLIPTA 62.5-25 MCG/INH IN AEPB: 1.0000 | INHALATION_SPRAY | Freq: Every day | RESPIRATORY_TRACT | 0 refills | Status: DC

## 2020-01-06 MED ORDER — BREO ELLIPTA 100-25 MCG/INH IN AEPB: 1.0000 | INHALATION_SPRAY | Freq: Every day | RESPIRATORY_TRACT | 0 refills | Status: DC

## 2020-01-06 NOTE — Chronic Care Management (AMB) (Signed)
Chronic Care Management Pharmacy  Name: Dana Sparks  MRN: [id number] DOB: [birth year]  Chief Complaint/ HPI  Dana Sparks,  69 y.o. , female presents for their Initial CCM visit with the clinical pharmacist via telephone due to COVID-19 Pandemic.  PCP : Steele Sizer, MD  Their chronic conditions include: DM, HTN, HLD  Office Visits: 3/26 Sinusitis, Sowles,, Z pack 3/11 DM, [place]les, BP 130/80 P 83, A1c increasing, PHQ9 normal, clonidine for sweating, restart CPAP Consult Visit: 4/20 Diabetes, [place], BP 118/60 P 85 wt 212 BMI 74.1, new Trulicity, FSBG 61 - 423, 3/21 A1c 8.3% 4/7 Diabetes foot, Troxler, trimmed nails, start Eucerin  Medications: Outpatient Encounter Medications as of 01/06/2020  Medication Sig  . ACCU-CHEK SMARTVIEW test strip Check fsbs two times daily  DMII  . allopurinol (ZYLOPRIM) 100 MG tablet Take 1 tablet (100 mg total) by mouth daily.  Marland Kitchen amLODipine (NORVASC) 5 MG tablet Take 1 tablet (5 mg total) by mouth daily.  Marland Kitchen aspirin EC 81 MG tablet Take 81 mg by mouth every morning.   . Cholecalciferol (VITAMIN D-3 PO) Take 1,000 Units by mouth daily.   Marland Kitchen CINNAMON PO Take 1,000 mg by mouth daily.  . cloNIDine (CATAPRES) 0.1 MG tablet Take 1 tablet (0.1 mg total) by mouth every evening.  . Coenzyme Q10 100 MG capsule Take 1 capsule (100 mg total) by mouth daily.  . DENTA 5000 PLUS 1.1 % CREA dental cream   . Dulaglutide (TRULICITY [place]) Inject into the skin. Inject SQ once weekly  . Ferrous Sulfate (IRON) 325 (65 Fe) MG TABS Take 325 mg by mouth daily. Nature made brand-  . fluticasone (FLONASE) 50 MCG/ACT nasal spray PLACE 2 SPRAYS INTO BOTH NOSTRILS DAILY.  Marland Kitchen Insulin Glargine (BASAGLAR KWIKPEN) 100 UNIT/ML SOPN Inject 60 Units into the skin at bedtime.  . Insulin Pen Needle (FIFTY50 PEN NEEDLES) 31G X 5 MM MISC Use once daily  . isosorbide mononitrate (IMDUR) 60 MG 24 hr tablet Take 1 tablet (60 mg total) by mouth daily.  . Lancets 28G MISC Use 1 each 2 (two) times  daily accu chek. Diagnosis: E11.22  . lisinopril (ZESTRIL) 20 MG tablet Take 1 tablet (20 mg total) by mouth daily.  Marland Kitchen loratadine (CLARITIN) 10 MG tablet Take 1 tablet (10 mg total) by mouth daily.  . metoCLOPramide (REGLAN) 5 MG tablet TAKE 1-2 TABLETS (5-10 MG TOTAL) FOUR TIMES DAILY   BEFORE MEALS AND AT BEDTIME.  . metoprolol succinate (TOPROL-XL) 50 MG 24 hr tablet Take 1 tablet (50 mg total) by mouth daily.  . montelukast (SINGULAIR) 10 MG tablet Take 1 tablet (10 mg total) by mouth every evening.  . pantoprazole (PROTONIX) 40 MG tablet Take 1 tablet (40 mg total) by mouth every morning.  . rosuvastatin (CRESTOR) 20 MG tablet Take 1 tablet (20 mg total) by mouth daily.  Marland Kitchen SYNJARDY XR 12.01-999 MG TB24 Take 2 tablets by mouth daily.  Marland Kitchen venlafaxine XR (EFFEXOR-XR) 75 MG 24 hr capsule Take one tablet a day.  Marland Kitchen ACCU-CHEK FASTCLIX LANCETS MISC   . Alcohol Swabs (B-D SINGLE USE SWABS REGULAR) PADS 1 each by Does not apply route 4 (four) times daily.  . BD INSULIN SYRINGE U/F 31G X 5/16" 0.3 ML MISC   . blood glucose meter kit and supplies KIT Dispense based on patient and insurance preference. Use up to four times daily as directed. (FOR ICD-9 250.00, 250.01).  . Fluticasone-Salmeterol (ADVAIR) 250-50 MCG/DOSE AEPB Inhale 1 puff into the lungs  2 (two) times daily. (Patient not taking: Reported on 01/06/2020)  . Turmeric (QC TUMERIC COMPLEX) 500 MG CAPS Take 500 mg by mouth daily.   No facility-administered encounter medications on file as of 01/06/2020.     Current Diagnosis/Assessment:  Goals Addressed            This Visit's Progress   . Diabetes Mellitus - goal A1c < 7% (pt-stated)       CARE PLAN ENTRY (see longitudinal plan of care for additional care plan information)  Current Barriers:  . Diabetes: type 2; complicated by chronic medical conditions including COPD, HLD, Lab Results  Component Value Date   HGBA1C 8.2 11/05/2019 .   Lab Results  Component Value Date    CREATININE 0.88 03/17/2019   CREATININE 0.71 08/16/2018   CREATININE 0.68 07/15/2018 .    Marland Kitchen Current antihyperglycemic regimen: Trulicity,Ozempic, Synjardy . Reports hypoglycemic symptoms 2-3 weekly, including dizziness, lightheadedness, shaking, sweating . Denies hyperglycemic symptoms, including polyuria, polydipsia, polyphagia, nocturia, blurred vision, neuropathy . Current exercise: walking . Current blood glucose readings: typically in the 120 range . Cardiovascular risk reduction: o Current hypertensive regimen: metoprolol, lisinopril o Current hyperlipidemia regimen: Crestor o Current antiplatelet regimen: ASA 76m  Pharmacist Clinical Goal(s):  .Marland KitchenOver the next 90 days, patient will work with PharmD and primary care provider to address frequent hypoglycemia  Interventions: . Comprehensive medication review performed, medication list updated in electronic medical record . Inter-disciplinary care team collaboration (see longitudinal plan of care) . Once patient checks FSBG at various times daily, associate meals and snacks with those times  Patient Self Care Activities:  . Patient will check blood glucose twice daily for 2 weeks, document, and provide at future appointments . Patient will focus on medication adherence by medication timing . Patient will take medications as prescribed . Patient will contact provider with any episodes of hypoglycemia . Patient will report any questions or concerns to provider   Initial goal documentation     . Hyperlipidemia - goal LDL < 70       CARE PLAN ENTRY (see longitudinal plan of care for additional care plan information)  Current Barriers:  . Controlled hyperlipidemia, complicated by diabetes, hypertension . Current antihyperlipidemic regimen: Crestor . Previous antihyperlipidemic medications tried NA . Most recent lipid panel:     Component Value Date/Time   CHOL 138 03/17/2019 0854   CHOL 143 06/28/2015 1017   TRIG 100  03/17/2019 0854   HDL 53 03/17/2019 0854   HDL 53 06/28/2015 1017   CHOLHDL 2.6 03/17/2019 0854   VLDL 16 02/27/2017 0902   LDLCALC 66 03/17/2019 0854 .   .Marland KitchenASCVD risk enhancing conditions: age >>20 DM, HTN  Pharmacist Clinical Goal(s):  .Marland KitchenOver the next 90 days, patient will work with PharmD and providers towards improving diet for optimized antihyperlipidemic therapy  Interventions: . Comprehensive medication review performed; medication list updated in electronic medical record.  .[facility] team collaboration (see longitudinal plan of care) . Discussed more routine diet  Patient Self Care Activities:  . Patient will focus on medication adherence by continuing current pattern and supporting with more regularly timed diet  Initial goal documentation       Diabetes   Recent Relevant Labs: Lab Results  Component Value Date/Time   HGBA1C 8.2 11/05/2019 12:00 AM   HGBA1C 7.8 01/07/2019 12:00 AM   MICROALBUR 15.9 11/05/2019 12:00 AM   MICROALBUR 0.8 03/17/2019 08:54 AM   MICROALBUR 50 03/13/2016 09:38 AM  MICROALBUR 20 06/28/2015 09:10 AM     Checking BG: Daily  Recent FBG Readings: approximately 60 Patient has failed these meds in past: glipizide, metformin Patient is currently uncontrolled on the following medications: Basaglar, Trulicity, Synjardy  Last diabetic Foot exam:  Lab Results  Component Value Date/Time   HMDIABEYEEXA No Retinopathy 12/01/2019 12:00 AM   HMDIABEYEEXA No Retinopathy 12/01/2019 12:00 AM    Last diabetic Eye exam:  Lab Results  Component Value Date/Time   HMDIABFOOTEX Dr. Marjory Sneddon and Fungal  09/23/2018 12:00 AM     We discussed: Use CPAP, take Reglan with meals  Takes Reglan 7m twice daily (different than list) Doesn't eat in morning Takes Synjardy qam  Takes pantoprazole due to diabetic gastroparesis  Plan  Continue current medications  Depression    Patient has failed these meds in past:  NA Patient is currently controlled on the following medications: Effexor  We discussed:  Effexor effective  Plan  Continue current medications  COPD / Asthma / Tobacco   Last spirometry score: 94%  Gold Grade: Gold 1 (FEV1>80%) Current COPD Classification:  A (low sx, <2 exacerbations/yr)  Eosinophil count:   Lab Results  Component Value Date/Time   EOSPCT 3.3 03/17/2019 08:54 AM   EOSPCT 1.6 05/08/2012 11:28 AM  %                               Eos (Absolute):  Lab Results  Component Value Date/Time   EOSABS 221 03/17/2019 08:54 AM   EOSABS 0.3 11/01/2015 11:16 AM   EOSABS 0.2 05/08/2012 11:28 AM    Tobacco Status:  Social History   Tobacco Use  Smoking Status Former Smoker  . Packs/day: 1.00  . Years: 40.00  . Pack years: 40.00  . Types: Cigarettes  . Start date: 09/04/1972  . Quit date: 12/30/2012  . Years since quitting: 7.0  Smokeless Tobacco Never Used  Tobacco Comment   smoking cessation materials not required    Patient has failed these meds in past: Advair (discontinued) Patient is currently controlled on the following medications: Anoro Ellipta Using maintenance inhaler regularly? Yes Frequency of rescue inhaler use:  infrequently  We discussed:  not taking Advair, Singulair at night Anoro ellipta daily cheaper  Plan  Continue current medications   Hyperlipidemia   Lipid Panel     Component Value Date/Time   CHOL 138 03/17/2019 0854   CHOL 143 06/28/2015 1017   TRIG 100 03/17/2019 0854   HDL 53 03/17/2019 0854   HDL 53 06/28/2015 1017   CHOLHDL 2.6 03/17/2019 0854   VLDL 16 02/27/2017 0902   LDLCALC 66 03/17/2019 0854   LABVLDL 28 06/28/2015 1017     The 10-year ASCVD risk score (Mikey BussingDC Jr., et al., 2013) is: 15%   Values used to calculate the score:     Age: [age]years     Sex: Female     Is Non-Hispanic African American: Yes     Diabetic: Yes     Tobacco smoker: No     Systolic Blood Pressure: 1127mmHg     Is BP treated: Yes      HDL Cholesterol: 53 mg/dL     Total Cholesterol: 138 mg/dL   Patient has failed these meds in past: NA Patient is currently controlled on the following medications: Crestor  We discussed:  Crestor timing, diet  Plan  Continue current medications  Hypertension   Office blood  pressures are  BP Readings from Last 3 Encounters:  12/11/19 108/64  11/13/19 130/80  10/16/19 100/70    Patient has failed these meds in the past: NA  Patient checks BP at home infrequently  Patient home BP readings are ranging: NA  We discussed Role of metoprolol and isosorbide for controlling chest pain. Patient state controlled.  Plan  Continue current medications     Medication Management   Pt uses [facility] for all medications Uses pill box? No - timing, mobile Pt endorses 100% compliance  We discussed: Will need to renew PAP for Basaglar, Trulicity, and Synjardy soon (some forms done in February, unclear which).  Disadvantaged for [facility] pharmacy  Plan  Continue current medication management strategy   Follow up: 1 month phone visit  Milus Height, PharmD, [place], [facility] [phone]

## 2020-01-06 NOTE — Patient Instructions (Addendum)
Visit Information  Goals Addressed            This Visit's Progress   . Diabetes Mellitus - goal A1c < 7% (pt-stated)       CARE PLAN ENTRY (see longitudinal plan of care for additional care plan information)  Current Barriers:  . Diabetes: type 2; complicated by chronic medical conditions including COPD, HLD, Lab Results  Component Value Date   HGBA1C 8.2 11/05/2019 .   Lab Results  Component Value Date   CREATININE 0.88 03/17/2019   CREATININE 0.71 08/16/2018   CREATININE 0.68 07/15/2018 .    Marland Kitchen Current antihyperglycemic regimen: Trulicity,Ozempic, Synjardy . Reports hypoglycemic symptoms 2-3 weekly, including dizziness, lightheadedness, shaking, sweating . Denies hyperglycemic symptoms, including polyuria, polydipsia, polyphagia, nocturia, blurred vision, neuropathy . Current exercise: walking . Current blood glucose readings: typically in the 120 range . Cardiovascular risk reduction: o Current hypertensive regimen: metoprolol, lisinopril o Current hyperlipidemia regimen: Crestor o Current antiplatelet regimen: ASA 81mg   Pharmacist Clinical Goal(s):  Marland Kitchen Over the next 90 days, patient will work with PharmD and primary care provider to address frequent hypoglycemia  Interventions: . Comprehensive medication review performed, medication list updated in electronic medical record . Inter-disciplinary care team collaboration (see longitudinal plan of care) . Once patient checks FSBG at various times daily, associate meals and snacks with those times  Patient Self Care Activities:  . Patient will check blood glucose twice daily for 2 weeks, document, and provide at future appointments . Patient will focus on medication adherence by medication timing . Patient will take medications as prescribed . Patient will contact provider with any episodes of hypoglycemia . Patient will report any questions or concerns to provider   Initial goal documentation     . Hyperlipidemia -  goal LDL < 70       CARE PLAN ENTRY (see longitudinal plan of care for additional care plan information)  Current Barriers:  . Controlled hyperlipidemia, complicated by diabetes, hypertension . Current antihyperlipidemic regimen: Crestor . Previous antihyperlipidemic medications tried NA . Most recent lipid panel:     Component Value Date/Time   CHOL 138 03/17/2019 0854   CHOL 143 06/28/2015 1017   TRIG 100 03/17/2019 0854   HDL 53 03/17/2019 0854   HDL 53 06/28/2015 1017   CHOLHDL 2.6 03/17/2019 0854   VLDL 16 02/27/2017 0902   LDLCALC 66 03/17/2019 0854 .   Marland Kitchen ASCVD risk enhancing conditions: age >28, DM, HTN  Pharmacist Clinical Goal(s):  Marland Kitchen Over the next 90 days, patient will work with PharmD and providers towards improving diet for optimized antihyperlipidemic therapy  Interventions: . Comprehensive medication review performed; medication list updated in electronic medical record.  [name] care team collaboration (see longitudinal plan of care) . Discussed more routine diet  Patient Self Care Activities:  . Patient will focus on medication adherence by continuing current pattern and supporting with more regularly timed diet  Initial goal documentation        Dana Sparks was given information about Chronic Care Management services today including:  1. CCM service includes personalized support from designated clinical staff supervised by her physician, including individualized plan of care and coordination with other care providers 2. 24/7 contact phone numbers for assistance for urgent and routine care needs. 3. Standard insurance, coinsurance, copays and deductibles apply for chronic care management only during months in which we provide at least 20 minutes of these services. Most insurances cover these services at 100%, however patients may be  responsible for any copay, coinsurance and/or deductible if applicable. This service may help you avoid the need for  more expensive face-to-face services. 4. Only one practitioner may furnish and bill the service in a calendar month. 5. The patient may stop CCM services at any time (effective at the end of the month) by phone call to the office staff.  Patient agreed to services and verbal consent obtained.   Print copy of patient instructions provided.  Telephone follow up appointment with pharmacy team member scheduled for: 1 month  Milus Height, PharmD, [place], [facility] [phone] Gastroparesis  Gastroparesis is a condition in which food takes longer than normal to empty from the stomach. The condition is usually long-lasting (chronic). It may also be called delayed gastric emptying. There is no cure, but there are treatments and things that you can do at home to help relieve symptoms. Treating the underlying condition that causes gastroparesis can also help relieve symptoms. What are the causes? In many cases, the cause of this condition is not known. Possible causes include:  A hormone (endocrine) disorder, such as hypothyroidism or diabetes.  A nervous system disease, such as Parkinson's disease or multiple sclerosis.  Cancer, infection, or surgery that affects the stomach or vagus nerve. The vagus nerve runs from your chest, through your neck, to the lower part of your brain.  A connective tissue disorder, such as scleroderma.  Certain medicines. What increases the risk? You are more likely to develop this condition if you:  Have certain disorders or diseases, including: ? An endocrine disorder. ? An eating disorder. ? Amyloidosis. ? Scleroderma. ? Parkinson's disease. ? Multiple sclerosis. ? Cancer or infection of the stomach or the vagus nerve.  Have had surgery on the stomach or vagus nerve.  Take certain medicines.  Are female. What are the signs or symptoms? Symptoms of this condition include:  Feeling full after eating very  little.  Nausea.  Vomiting.  Heartburn.  Abdominal bloating.  Inconsistent blood sugar (glucose) levels on blood tests.  Lack of appetite.  Weight loss.  Acid from the stomach coming up into the esophagus (gastroesophageal reflux).  Sudden tightening (spasm) of the stomach, which can be painful. Symptoms may come and go. Some people may not notice any symptoms. How is this diagnosed? This condition is diagnosed with tests, such as:  Tests that check how long it takes food to move through the stomach and intestines. These tests include: ? Upper gastrointestinal (GI) series. For this test, you drink a liquid that shows up well on X-rays, and then X-rays will be taken of your intestines. ? Gastric emptying scintigraphy. For this test, you eat food that contains a small amount of radioactive material, and then scans are taken. ? Wireless capsule GI monitoring system. For this test, you swallow a pill (capsule) that records information about how foods and fluid move through your stomach.  Gastric manometry. For this test, a tube is passed down your throat and into your stomach to measure electrical and muscular activity.  Endoscopy. For this test, a long, thin tube is passed down your throat and into your stomach to check for problems in your stomach lining.  Ultrasound. This test uses sound waves to create images of inside the body. This can help rule out gallbladder disease or pancreatitis as a cause of your symptoms. How is this treated? There is no cure for gastroparesis. Treatment may include:  Treating the underlying cause.  Managing your symptoms  by making changes to your diet and exercise habits.  Taking medicines to control nausea and vomiting and to stimulate stomach muscles.  Getting food through a feeding tube in the hospital. This may be done in severe cases.  Having surgery to insert a device into your body that helps improve stomach emptying and control nausea  and vomiting (gastric neurostimulator). Follow these instructions at home:  Take over-the-counter and prescription medicines only as told by your health care provider.  Follow instructions from your health care provider about eating or drinking restrictions. Your health care provider may recommend that you: ? Eat smaller meals more often. ? Eat low-fat foods. ? Eat low-fiber forms of high-fiber foods. For example, eat cooked vegetables instead of raw vegetables. ? Have only liquid foods instead of solid foods. Liquid foods are easier to digest.  Drink enough fluid to keep your urine pale yellow.  Exercise as often as told by your health care provider.  Keep all follow-up visits as told by your health care provider. This is important. Contact a health care provider if you:  Notice that your symptoms do not improve with treatment.  Have new symptoms. Get help right away if you:  Have severe abdominal pain that does not improve with treatment.  Have nausea that is severe or does not go away.  Cannot drink fluids without vomiting. Summary  Gastroparesis is a chronic condition in which food takes longer than normal to empty from the stomach.  Symptoms include nausea, vomiting, heartburn, abdominal bloating, and loss of appetite.  Eating smaller portions, and low-fat, low-fiber foods may help you manage your symptoms.  Get help right away if you have severe abdominal pain. This information is not intended to replace advice given to you by your health care provider. Make sure you discuss any questions you have with your health care provider. Document Revised: 11/19/2017 Document Reviewed: 06/26/2017 Elsevier Patient Education  2020 [employer].

## 2020-01-08 DIAGNOSIS — Z780 Asymptomatic menopausal state: Secondary | ICD-10-CM | POA: Diagnosis not present

## 2020-01-12 DIAGNOSIS — W57XXXA Bitten or stung by nonvenomous insect and other nonvenomous arthropods, initial encounter: Secondary | ICD-10-CM | POA: Diagnosis not present

## 2020-01-12 DIAGNOSIS — R234 Changes in skin texture: Secondary | ICD-10-CM | POA: Diagnosis not present

## 2020-01-12 DIAGNOSIS — E1143 Type 2 diabetes mellitus with diabetic autonomic (poly)neuropathy: Secondary | ICD-10-CM | POA: Diagnosis not present

## 2020-01-16 DIAGNOSIS — G4733 Obstructive sleep apnea (adult) (pediatric): Secondary | ICD-10-CM | POA: Diagnosis not present

## 2020-01-19 DIAGNOSIS — R234 Changes in skin texture: Secondary | ICD-10-CM | POA: Diagnosis not present

## 2020-01-19 DIAGNOSIS — E1143 Type 2 diabetes mellitus with diabetic autonomic (poly)neuropathy: Secondary | ICD-10-CM | POA: Diagnosis not present

## 2020-01-19 DIAGNOSIS — W57XXXD Bitten or stung by nonvenomous insect and other nonvenomous arthropods, subsequent encounter: Secondary | ICD-10-CM | POA: Diagnosis not present

## 2020-02-03 ENCOUNTER — Telehealth: Payer: Self-pay

## 2020-02-09 DIAGNOSIS — E1159 Type 2 diabetes mellitus with other circulatory complications: Secondary | ICD-10-CM | POA: Diagnosis not present

## 2020-02-10 ENCOUNTER — Telehealth: Payer: Medicare HMO

## 2020-02-10 DIAGNOSIS — I10 Essential (primary) hypertension: Secondary | ICD-10-CM | POA: Diagnosis not present

## 2020-02-10 DIAGNOSIS — E1159 Type 2 diabetes mellitus with other circulatory complications: Secondary | ICD-10-CM | POA: Diagnosis not present

## 2020-02-10 DIAGNOSIS — E1142 Type 2 diabetes mellitus with diabetic polyneuropathy: Secondary | ICD-10-CM | POA: Diagnosis not present

## 2020-02-10 LAB — HEMOGLOBIN A1C: Hemoglobin A1C: 6.8

## 2020-02-11 ENCOUNTER — Telehealth: Payer: Self-pay | Admitting: Family Medicine

## 2020-02-11 NOTE — Chronic Care Management (AMB) (Signed)
  Chronic Care Management   Note  02/11/2020 Name: [name] MRN: [id number] DOB: 04-14-1951  Dana Sparks is a 69 y.o. year old female who is a primary care patient of Steele Sizer, MD and is actively engaged with the care management team. I reached out to [name] by phone today to assist with re-scheduling a follow up visit with the Pharmacist.  Follow up plan: Unsuccessful telephone outreach attempt made. A HIPPA compliant phone message was left for the patient providing contact information and requesting a return call. The care management team will reach out to the patient again over the next 7 days. If patient returns call to provider office, please advise to call [place] at [phone].  [place], [place] [address] Direct Dial: [phone] [email or web address] Website: [email or web address]

## 2020-02-16 DIAGNOSIS — G4733 Obstructive sleep apnea (adult) (pediatric): Secondary | ICD-10-CM | POA: Diagnosis not present

## 2020-02-16 NOTE — Chronic Care Management (AMB) (Signed)
  Chronic Care Management   Note  02/16/2020 Name: YEVA BISSETTE MRN: [id number] DOB: 1950-12-05  SAREENA ODEH is a 69 y.o. year old female who is a primary care patient of Steele Sizer, MD and is actively engaged with the care management team. I reached out to [name] by phone today to assist with re-scheduling a follow up visit with the Pharmacist.  Follow up plan: Telephone appointment with care management team member scheduled for:02/18/2020.  [place], [place] [address] Direct Dial: [phone] [email or web address] Website: [email or web address]

## 2020-02-18 ENCOUNTER — Ambulatory Visit: Payer: Medicare HMO | Admitting: Pharmacist

## 2020-02-18 ENCOUNTER — Other Ambulatory Visit: Payer: Self-pay

## 2020-02-18 DIAGNOSIS — E1143 Type 2 diabetes mellitus with diabetic autonomic (poly)neuropathy: Secondary | ICD-10-CM | POA: Diagnosis not present

## 2020-02-18 DIAGNOSIS — L851 Acquired keratosis [keratoderma] palmaris et plantaris: Secondary | ICD-10-CM | POA: Diagnosis not present

## 2020-02-18 DIAGNOSIS — B351 Tinea unguium: Secondary | ICD-10-CM | POA: Diagnosis not present

## 2020-02-18 NOTE — Chronic Care Management (AMB) (Signed)
Chronic Care Management Pharmacy  Name: MARCENA DIAS  MRN: [id number] DOB: 1950/11/05  Chief Complaint/ HPI  Dana Sparks,  69 y.o. , female presents for their Follow-Up CCM visit with the clinical pharmacist via telephone due to COVID-19 Pandemic.  PCP : Steele Sizer, MD  Their chronic conditions include: DM, HTN, HLD  Office Visits: NA  Consult Visit: 6/8 DM, [place], BP 102/60 P 90 Wt 211 BMI 41.1, A1c 6.8%, Basaglar 60 qhs, Trulicity qwk, Synjardy 12.01/999 mg bid, glipizide 21m daily  Medications: Outpatient Encounter Medications as of 02/18/2020  Medication Sig  . Fluticasone-Salmeterol (ADVAIR) 100-50 MCG/DOSE AEPB Inhale 1 puff into the lungs 2 (two) times daily.  .Marland KitchenACCU-CHEK FASTCLIX LANCETS MISC   . ACCU-CHEK SMARTVIEW test strip Check fsbs two times daily  DMII  . Alcohol Swabs (B-D SINGLE USE SWABS REGULAR) PADS 1 each by Does not apply route 4 (four) times daily.  .Marland Kitchenallopurinol (ZYLOPRIM) 100 MG tablet Take 1 tablet (100 mg total) by mouth daily.  .Marland KitchenamLODipine (NORVASC) 5 MG tablet Take 1 tablet (5 mg total) by mouth daily.  .Marland Kitchenaspirin EC 81 MG tablet Take 81 mg by mouth every morning.   . BD INSULIN SYRINGE U/F 31G X 5/16" 0.3 ML MISC   . blood glucose meter kit and supplies KIT Dispense based on patient and insurance preference. Use up to four times daily as directed. (FOR ICD-9 250.00, 250.01).  . Cholecalciferol (VITAMIN D-3 PO) Take 1,000 Units by mouth daily.   .Marland KitchenCINNAMON PO Take 1,000 mg by mouth daily.  . cloNIDine (CATAPRES) 0.1 MG tablet Take 1 tablet (0.1 mg total) by mouth every evening.  . Coenzyme Q10 100 MG capsule Take 1 capsule (100 mg total) by mouth daily.  . DENTA 5000 PLUS 1.1 % CREA dental cream   . Dulaglutide (TRULICITY [place]) Inject into the skin. Inject SQ once weekly  . Ferrous Sulfate (IRON) 325 (65 Fe) MG TABS Take 325 mg by mouth daily. Nature made brand-  . fluticasone (FLONASE) 50 MCG/ACT nasal spray PLACE 2 SPRAYS INTO BOTH NOSTRILS  DAILY.  .Marland KitchenInsulin Glargine (BASAGLAR KWIKPEN) 100 UNIT/ML SOPN Inject 60 Units into the skin at bedtime.  . Insulin Pen Needle (FIFTY50 PEN NEEDLES) 31G X 5 MM MISC Use once daily  . isosorbide mononitrate (IMDUR) 60 MG 24 hr tablet Take 1 tablet (60 mg total) by mouth daily.  . Lancets 28G MISC Use 1 each 2 (two) times daily accu chek. Diagnosis: E11.22  . lisinopril (ZESTRIL) 20 MG tablet Take 1 tablet (20 mg total) by mouth daily.  .Marland Kitchenloratadine (CLARITIN) 10 MG tablet Take 1 tablet (10 mg total) by mouth daily.  . metoCLOPramide (REGLAN) 5 MG tablet TAKE 1-2 TABLETS (5-10 MG TOTAL) FOUR TIMES DAILY   BEFORE MEALS AND AT BEDTIME.  . metoprolol succinate (TOPROL-XL) 50 MG 24 hr tablet Take 1 tablet (50 mg total) by mouth daily.  . montelukast (SINGULAIR) 10 MG tablet Take 1 tablet (10 mg total) by mouth every evening.  . pantoprazole (PROTONIX) 40 MG tablet Take 1 tablet (40 mg total) by mouth every morning.  . rosuvastatin (CRESTOR) 20 MG tablet Take 1 tablet (20 mg total) by mouth daily.  .Marland KitchenSYNJARDY XR 12.01-999 MG TB24 Take 2 tablets by mouth daily.  .Marland Kitchenumeclidinium-vilanterol (ANORO ELLIPTA) 62.5-25 MCG/INH AEPB Inhale 1 puff into the lungs daily. (Patient not taking: Reported on 02/21/2020)  . venlafaxine XR (EFFEXOR-XR) 75 MG 24 hr capsule Take one  tablet a day.   No facility-administered encounter medications on file as of 02/18/2020.     Financial Resource Strain: Low Risk   . Difficulty of Paying Living Expenses: Not very hard    Current Diagnosis/Assessment:  Goals Addressed              This Visit's Progress   .  Diabetes Mellitus - goal A1c < 7% (pt-stated)        CARE PLAN ENTRY (see longitudinal plan of care for additional care plan information)  Current Barriers:  . Diabetes: type 2; complicated by chronic medical conditions including COPD, HLD, Lab Results  Component Value Date   HGBA1C 8.2 11/05/2019 .   Lab Results  Component Value Date   CREATININE 0.88  03/17/2019   CREATININE 0.71 08/16/2018   CREATININE 0.68 07/15/2018 .    Marland Kitchen Per Endocrinology, A1c = 6.8%, now controlled . Current antihyperglycemic regimen: Trulicity,Ozempic, Synjardy . Reports hypoglycemic symptoms 2-3 weekly, including dizziness, lightheadedness, shaking, sweating . Denies hyperglycemic symptoms, including polyuria, polydipsia, polyphagia, nocturia, blurred vision, neuropathy . Current exercise: walking . Current blood glucose readings: typically in the 120 range . Cardiovascular risk reduction: o Current hypertensive regimen: metoprolol, lisinopril o Current hyperlipidemia regimen: Crestor o Current antiplatelet regimen: ASA 56m  Pharmacist Clinical Goal(s):  .Marland KitchenOver the next 90 days, patient will work with PharmD and primary care provider to address frequent hypoglycemia  Interventions: . Comprehensive medication review performed, medication list updated in electronic medical record . Inter-disciplinary care team collaboration (see longitudinal plan of care) . Once patient checks FSBG at various times daily, associate meals and snacks with those times  Patient Self Care Activities:  . Patient will check blood glucose twice daily for 2 weeks, document, and provide at future appointments . Patient will focus on medication adherence by submitting financial paperwork for 2022 patient assistance programs. . Patient will take medications as prescribed . Patient will contact provider with any episodes of hypoglycemia . Patient will report any questions or concerns to provider   Initial goal documentation        Diabetes   Recent Relevant Labs: Lab Results  Component Value Date/Time   HGBA1C 8.2 11/05/2019 12:00 AM   HGBA1C 7.8 01/07/2019 12:00 AM   MICROALBUR 15.9 11/05/2019 12:00 AM   MICROALBUR 0.8 03/17/2019 08:54 AM   MICROALBUR 50 03/13/2016 09:38 AM   MICROALBUR 20 06/28/2015 09:10 AM     Patient is currently controlled on the following  medications: Basaglar 60 qhs, Trulicity qwk, Synjardy 12.01/999 mg bid, glipizide 561mdaily  Last diabetic Foot exam:  Lab Results  Component Value Date/Time   HMDIABEYEEXA No Retinopathy 12/01/2019 12:00 AM   HMDIABEYEEXA No Retinopathy 12/01/2019 12:00 AM    Last diabetic Eye exam:  Lab Results  Component Value Date/Time   HMDIABFOOTEX Dr. TrMarjory Sneddonnd Fungal  09/23/2018 12:00 AM     We discussed:  CMDuboistownoesn't have current A1c = 6.8%  Plan  Continue current medications   COPD / Asthma / Tobacco   Eosinophil count:   Lab Results  Component Value Date/Time   EOSPCT 3.3 03/17/2019 08:54 AM   EOSPCT 1.6 05/08/2012 11:28 AM  %                               Eos (Absolute):  Lab Results  Component Value Date/Time   EOSABS 221 03/17/2019 08:54 AM   EOSABS 0.3 11/01/2015 11:16 AM  EOSABS 0.2 05/08/2012 11:28 AM    Tobacco Status:  Social History   Tobacco Use  Smoking Status Former Smoker  . Packs/day: 1.00  . Years: 40.00  . Pack years: 40.00  . Types: Cigarettes  . Start date: 09/04/1972  . Quit date: 12/30/2012  . Years since quitting: 7.1  Smokeless Tobacco Never Used  Tobacco Comment   smoking cessation materials not required    Patient has failed these meds in past: NA Patient is currently controlled on the following medications: Wixela Using maintenance inhaler regularly? No Frequency of rescue inhaler use:  never  We discussed:   Using CPAP? Yes Washed it Using inhalers twice daily? Wixela once daily.  Agreed to twice daily Wixela not as good.  No rescue inhaler Change to Trelegy or Breztri?   Plan  Recommend Add dx COPD Recommend Ventolin HFA 2 puffs every 6 hours as needed Patient to use Wixela twice daily as directed Continue current medications  Medication Management   Pt uses [facility] for all medications Uses pill box? Yes Pt endorses 80% compliance  We discussed:   PAP needed? Not unil 2022  Plan  Continue  current medication management strategy  Follow up: 3 month phone visit  Milus Height, PharmD, [place], [facility] [phone]

## 2020-02-21 NOTE — Patient Instructions (Addendum)
Visit Information  Goals Addressed              This Visit's Progress   .  Diabetes Mellitus - goal A1c < 7% (pt-stated)        CARE PLAN ENTRY (see longitudinal plan of care for additional care plan information)  Current Barriers:  . Diabetes: type 2; complicated by chronic medical conditions including COPD, HLD, Lab Results  Component Value Date   HGBA1C 8.2 11/05/2019 .   Lab Results  Component Value Date   CREATININE 0.88 03/17/2019   CREATININE 0.71 08/16/2018   CREATININE 0.68 07/15/2018 .    Marland Kitchen Per Endocrinology, A1c = 6.8%, now controlled . Current antihyperglycemic regimen: Trulicity,Ozempic, Synjardy . Reports hypoglycemic symptoms 2-3 weekly, including dizziness, lightheadedness, shaking, sweating . Denies hyperglycemic symptoms, including polyuria, polydipsia, polyphagia, nocturia, blurred vision, neuropathy . Current exercise: walking . Current blood glucose readings: typically in the 120 range . Cardiovascular risk reduction: o Current hypertensive regimen: metoprolol, lisinopril o Current hyperlipidemia regimen: Crestor o Current antiplatelet regimen: ASA 81mg   Pharmacist Clinical Goal(s):  Marland Kitchen Over the next 90 days, patient will work with PharmD and primary care provider to address frequent hypoglycemia  Interventions: . Comprehensive medication review performed, medication list updated in electronic medical record . Inter-disciplinary care team collaboration (see longitudinal plan of care) . Once patient checks FSBG at various times daily, associate meals and snacks with those times  Patient Self Care Activities:  . Patient will check blood glucose twice daily for 2 weeks, document, and provide at future appointments . Patient will focus on medication adherence by submitting financial paperwork for 2022 patient assistance programs. . Patient will take medications as prescribed . Patient will contact provider with any episodes of hypoglycemia . Patient  will report any questions or concerns to provider   Initial goal documentation        Print copy of patient instructions provided.   Telephone follow up appointment with pharmacy team member scheduled for: 3 months  Milus Height, PharmD, [place], CTTS Clinical Pharmacist [facility] [phone] Chronic Obstructive Pulmonary Disease Chronic obstructive pulmonary disease (COPD) is a long-term (chronic) lung problem. When you have COPD, it is hard for air to get in and out of your lungs. Usually the condition gets worse over time, and your lungs will never return to normal. There are things you can do to keep yourself as healthy as possible.  Your doctor may treat your condition with: ? Medicines. ? Oxygen. ? Lung surgery.  Your doctor may also recommend: ? Rehabilitation. This includes steps to make your body work better. It may involve a team of specialists. ? Quitting smoking, if you smoke. ? Exercise and changes to your diet. ? Comfort measures (palliative care). Follow these instructions at home: Medicines  Take over-the-counter and prescription medicines only as told by your doctor.  Talk to your doctor before taking any cough or allergy medicines. You may need to avoid medicines that cause your lungs to be dry. Lifestyle  If you smoke, stop. Smoking makes the problem worse. If you need help quitting, ask your doctor.  Avoid being around things that make your breathing worse. This may include smoke, chemicals, and fumes.  Stay active, but remember to rest as well.  Learn and use tips on how to relax.  Make sure you get enough sleep. Most adults need at least 7 hours of sleep every night.  Eat healthy foods. Eat smaller meals more often. Rest before meals. Controlled  breathing Learn and use tips on how to control your breathing as told by your doctor. Try:  Breathing in (inhaling) through your nose for 1 second. Then, pucker your lips and breath out  (exhale) through your lips for 2 seconds.  Putting one hand on your belly (abdomen). Breathe in slowly through your nose for 1 second. Your hand on your belly should move out. Pucker your lips and breathe out slowly through your lips. Your hand on your belly should move in as you breathe out.  Controlled coughing Learn and use controlled coughing to clear mucus from your lungs. Follow these steps: 1. Lean your head a little forward. 2. Breathe in deeply. 3. Try to hold your breath for 3 seconds. 4. Keep your mouth slightly open while coughing 2 times. 5. Spit any mucus out into a tissue. 6. Rest and do the steps again 1 or 2 times as needed. General instructions  Make sure you get all the shots (vaccines) that your doctor recommends. Ask your doctor about a flu shot and a pneumonia shot.  Use oxygen therapy and pulmonary rehabilitation if told by your doctor. If you need home oxygen therapy, ask your doctor if you should buy a tool to measure your oxygen level (oximeter).  Make a COPD action plan with your doctor. This helps you to know what to do if you feel worse than usual.  Manage any other conditions you have as told by your doctor.  Avoid going outside when it is very hot, cold, or humid.  Avoid people who have a sickness you can catch (contagious).  Keep all follow-up visits as told by your doctor. This is important. Contact a doctor if:  You cough up more mucus than usual.  There is a change in the color or thickness of the mucus.  It is harder to breathe than usual.  Your breathing is faster than usual.  You have trouble sleeping.  You need to use your medicines more often than usual.  You have trouble doing your normal activities such as getting dressed or walking around the house. Get help right away if:  You have shortness of breath while resting.  You have shortness of breath that stops you from: ? Being able to talk. ? Doing normal activities.  Your  chest hurts for longer than 5 minutes.  Your skin color is more blue than usual.  Your pulse oximeter shows that you have low oxygen for longer than 5 minutes.  You have a fever.  You feel too tired to breathe normally. Summary  Chronic obstructive pulmonary disease (COPD) is a long-term lung problem.  The way your lungs work will never return to normal. Usually the condition gets worse over time. There are things you can do to keep yourself as healthy as possible.  Take over-the-counter and prescription medicines only as told by your doctor.  If you smoke, stop. Smoking makes the problem worse. This information is not intended to replace advice given to you by your health care provider. Make sure you discuss any questions you have with your health care provider. Document Revised: 08/03/2017 Document Reviewed: 09/25/2016 Elsevier Patient Education  2020 [employer].

## 2020-02-22 ENCOUNTER — Encounter: Payer: Self-pay | Admitting: Family Medicine

## 2020-02-22 DIAGNOSIS — J410 Simple chronic bronchitis: Secondary | ICD-10-CM | POA: Insufficient documentation

## 2020-02-22 DIAGNOSIS — J42 Unspecified chronic bronchitis: Secondary | ICD-10-CM | POA: Insufficient documentation

## 2020-03-05 ENCOUNTER — Telehealth: Payer: Self-pay

## 2020-03-05 DIAGNOSIS — Z87891 Personal history of nicotine dependence: Secondary | ICD-10-CM

## 2020-03-05 DIAGNOSIS — Z122 Encounter for screening for malignant neoplasm of respiratory organs: Secondary | ICD-10-CM

## 2020-03-05 NOTE — Telephone Encounter (Signed)
Patient has been notified that the low dose lung cancer screening CT scan is due currently or will be in near future.  Confirmed that patient is within the appropriate age range and asymptomatic, (no signs or symptoms of lung cancer).  Patient denies illness that would prevent curative treatment for lung cancer if found.  Patient is agreeable for CT scan being scheduled.    Verified smoking history (former smoker, quit 12/2012 with 40 year 1 ppd history).   CT scheduled for 03/15/20 @ 9:00

## 2020-03-09 NOTE — Telephone Encounter (Signed)
Smoking history: former, quit 2014, 40 pack year

## 2020-03-09 NOTE — Addendum Note (Signed)
Addended by: Lieutenant Diego on: 03/09/2020 03:06 PM   Modules accepted: Orders

## 2020-03-10 ENCOUNTER — Encounter: Payer: Self-pay | Admitting: Family Medicine

## 2020-03-10 ENCOUNTER — Ambulatory Visit (INDEPENDENT_AMBULATORY_CARE_PROVIDER_SITE_OTHER): Payer: Medicare HMO | Admitting: Family Medicine

## 2020-03-10 ENCOUNTER — Other Ambulatory Visit: Payer: Self-pay

## 2020-03-10 VITALS — BP 100/60 | HR 93 | Temp 96.8°F | Resp 16 | Ht 66.0 in | Wt 213.2 lb

## 2020-03-10 DIAGNOSIS — I209 Angina pectoris, unspecified: Secondary | ICD-10-CM

## 2020-03-10 DIAGNOSIS — T148XXA Other injury of unspecified body region, initial encounter: Secondary | ICD-10-CM | POA: Diagnosis not present

## 2020-03-10 DIAGNOSIS — E1169 Type 2 diabetes mellitus with other specified complication: Secondary | ICD-10-CM

## 2020-03-10 DIAGNOSIS — Z23 Encounter for immunization: Secondary | ICD-10-CM | POA: Diagnosis not present

## 2020-03-10 DIAGNOSIS — I272 Pulmonary hypertension, unspecified: Secondary | ICD-10-CM

## 2020-03-10 DIAGNOSIS — E1143 Type 2 diabetes mellitus with diabetic autonomic (poly)neuropathy: Secondary | ICD-10-CM | POA: Diagnosis not present

## 2020-03-10 DIAGNOSIS — G4733 Obstructive sleep apnea (adult) (pediatric): Secondary | ICD-10-CM | POA: Diagnosis not present

## 2020-03-10 DIAGNOSIS — F330 Major depressive disorder, recurrent, mild: Secondary | ICD-10-CM

## 2020-03-10 DIAGNOSIS — J410 Simple chronic bronchitis: Secondary | ICD-10-CM

## 2020-03-10 DIAGNOSIS — K219 Gastro-esophageal reflux disease without esophagitis: Secondary | ICD-10-CM | POA: Diagnosis not present

## 2020-03-10 DIAGNOSIS — I10 Essential (primary) hypertension: Secondary | ICD-10-CM

## 2020-03-10 DIAGNOSIS — F3341 Major depressive disorder, recurrent, in partial remission: Secondary | ICD-10-CM

## 2020-03-10 DIAGNOSIS — I739 Peripheral vascular disease, unspecified: Secondary | ICD-10-CM

## 2020-03-10 DIAGNOSIS — E785 Hyperlipidemia, unspecified: Secondary | ICD-10-CM | POA: Diagnosis not present

## 2020-03-10 LAB — CBC WITH DIFFERENTIAL/PLATELET
Absolute Monocytes: 469 cells/uL (ref 200–950)
Basophils Absolute: 40 cells/uL (ref 0–200)
Basophils Relative: 0.6 %
Eosinophils Absolute: 221 cells/uL (ref 15–500)
Eosinophils Relative: 3.3 %
HCT: 41.1 % (ref 35.0–45.0)
Hemoglobin: 13.9 g/dL (ref 11.7–15.5)
Lymphs Abs: 2995 cells/uL (ref 850–3900)
MCH: 30.9 pg (ref 27.0–33.0)
MCHC: 33.8 g/dL (ref 32.0–36.0)
MCV: 91.3 fL (ref 80.0–100.0)
MPV: 9.9 fL (ref 7.5–12.5)
Monocytes Relative: 7 %
Neutro Abs: 2975 cells/uL (ref 1500–7800)
Neutrophils Relative %: 44.4 %
Platelets: 296 10*3/uL (ref 140–400)
RBC: 4.5 10*6/uL (ref 3.80–5.10)
RDW: 13.4 % (ref 11.0–15.0)
Total Lymphocyte: 44.7 %
WBC: 6.7 10*3/uL (ref 3.8–10.8)

## 2020-03-10 LAB — LIPID PANEL
Cholesterol: 126 mg/dL (ref <200)
HDL: 53 mg/dL (ref >=50)
LDL Cholesterol (Calc): 56 mg/dL (calc)
Non-HDL Cholesterol (Calc): 73 mg/dL (calc) (ref <130)
Total CHOL/HDL Ratio: 2.4 (calc) (ref <5.0)
Triglycerides: 89 mg/dL (ref <150)

## 2020-03-10 MED ORDER — AMLODIPINE BESYLATE 2.5 MG PO TABS: 2.5000 mg | ORAL_TABLET | Freq: Every day | ORAL | 0 refills | Status: DC

## 2020-03-10 NOTE — Progress Notes (Signed)
Name: Dana Sparks   MRN: [id number]    DOB: [birth year]   Date:03/10/2020       Progress Note  Subjective  Chief Complaint  Chief Complaint  Patient presents with  . Diabetes  . Dyslipidemia  . Gastroesophageal Reflux  . Hypertension    HPI  DMII with gastroparesis/CKI/neuropathy: she is taking medication as prescribed, seeing Dr. Gabriel Carina.Her hgbA1C has gone up from7.8% to 8.2%, it was up 8.2%  But down to 6.8 % since Trulicity was added. She is off Glipizide and also taking Synjardi and Loss adjuster, chartered. She is getting it through [employer] and has been compliant with mediation. She states doing well and no side effects. She has gastroparesis controlled with reglan before meals, dyslipidemia on crestor, on ACE .Shehas intermittentpolyphagia, polyuria or polydipsia. Eye exam is up to date. She has vascular disease and intermittent claudication   HTN:she has been compliant with medication bp is towards low end of normal and has noticed some episodes dizziness when standing up, we will decrease dose of norvasc from 5 mg to 2.5 mg and monitor. No  chest pain - angina controlled with medical management denies palpitation.   Angina: CAD, her cardiologist is Dr. Clayborn Bigness, she is due for follow up, she skipped 2020 due to pandemic She is also on statin therapy, aspirin, beta blockerand ace and Imdur , angina under control with medical management . She was on disability for heart disease for years but in social security now. Last  Echo from 2016 showed decreased in EF 25-30% and global hypokines. She has some SOB with activity, she does not have orthopnea or PND  OSA: she has been wearing CPAP with humidifier about once a week, needs to increase compliance  Major Depression in remission  she is doing well on Effexor again, she was off medication and had severe emotional liability, back on medication for a few weeks and denies crying spellsphq 9is normal  but she states she has  occasional down episodes.   Obesity:she is trying to eat healthy,back to the gym on Trulicity and BMI is now below 35, continue hard work   Hyperlipidemia: taking statin therapy,no muscle problems, reviewed last labs with patient , LDL 66 , continue medication  Chronic bronchitis: spirometry today showed normal Fev1/FVC ration at 94% she used to smoke 1 pack daily for 50 years, quit smoking in 2014 she states no longer coughing in the mornings, has some sob with moderate activity and no wheezing. Cannot afford Anoro, using Advair only once daily but recently increase to twice daly as prescribed   Hypercalcemia:  Reviewed labs done by Dr. Gabriel Carina   PVD with claudication: on aspirin and statin therapy, states not walking far therefore not having leg pains, but just resumed going back to the gym  Wound: she noticed a spot on left outer hip over the past week, not sure how it happened, she is due for a tdap, she has two small areas wound/versus laceration    Patient Active Problem List   Diagnosis Date Noted  . Chronic bronchitis ([place]) 02/22/2020  . Disease of stomach   . Hx of colonic polyps   . Hepatomegaly 07/23/2018  . Fatty liver 07/23/2018  . Aortic atherosclerosis ([place]) 07/09/2018  . Type II or unspecified type diabetes mellitus with neurological manifestations, not stated as uncontrolled(250.60) 09/03/2017  . Nasal polyp 09/03/2017  . Lipoma of back 09/03/2017  . Claudication ([place]) 06/04/2017  . LVH (left ventricular hypertrophy) 02/27/2017  . Decreased  cardiac ejection fraction 02/27/2017  . Left hand weakness 10/12/2016  . Elevated antinuclear antibody (ANA) level 04/27/2016  . Angina pectoris ([place]) 11/01/2015  . Well controlled type 2 diabetes mellitus with gastroparesis ([place]) 06/22/2015  . Low TSH level 06/22/2015  . Iron deficiency anemia 04/07/2015  . Intestinal metaplasia of gastric mucosa 03/11/2015  . CAD (coronary artery disease), native coronary artery  02/24/2015  . History of coronary artery stent placement 02/24/2015  . Chronic kidney disease, stage 3, mod decreased GFR 02/24/2015  . Menopause 02/24/2015  . History of Helicobacter pylori infection 02/24/2015  . Mild mitral insufficiency 02/24/2015  . Mild tricuspid insufficiency 02/24/2015  . 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 ([place]) 02/24/2015  . Controlled gout 02/24/2015  . Depression, major, recurrent, mild ([place]) 02/24/2015  . Morbid obesity due to excess calories ([place]) 02/24/2015  . Mild pulmonary hypertension ([place]) 02/24/2015  . Gastroesophageal reflux disease without esophagitis 02/24/2015  . Perennial allergic rhinitis 02/24/2015  . HLD (hyperlipidemia) 02/20/2015  . Hypertension, benign 02/20/2015  . Apnea, sleep 02/20/2015  . Controlled diabetes mellitus with stage 3 chronic kidney disease, without long-term current use of insulin ([place]) 02/20/2015    Past Surgical History:  Procedure Laterality Date  . ABDOMINAL HYSTERECTOMY     total  . CATARACT EXTRACTION    . CATARACT EXTRACTION W/PHACO Left 03/24/2019   Procedure: CATARACT EXTRACTION PHACO AND INTRAOCULAR LENS PLACEMENT ([place])  LEFT DIABETIC;  Surgeon: Eulogio Bear, MD;  Location: [place];  Service: Ophthalmology;  Laterality: Left;  Diabetic - insulin and oral meds sleep apnea  . COLONOSCOPY  01/2014   sigmoid diverticulosis. desc colon TA, hyperplastic polyp  . COLONOSCOPY WITH PROPOFOL N/A 09/02/2018   Procedure: COLONOSCOPY WITH PROPOFOL;  Surgeon: Lucilla Lame, MD;  Location: [place];  Service: Endoscopy;  Laterality: N/A;  . CORONARY ANGIOPLASTY WITH STENT PLACEMENT    . ESOPHAGOGASTRODUODENOSCOPY N/A 02/16/2015   negative h pylori, focal gastic intestinal metaplasia  . ESOPHAGOGASTRODUODENOSCOPY (EGD) WITH PROPOFOL N/A 09/02/2018   Procedure: ESOPHAGOGASTRODUODENOSCOPY (EGD) WITH BIOPSIES;  Surgeon: Lucilla Lame, MD;  Location: [place];   Service: Endoscopy;  Laterality: N/A;  diabetic -insulin and oral meds sleep apnea  . TOTAL KNEE ARTHROPLASTY Right     Family History  Problem Relation Age of Onset  . Stroke Sister   . Hyperlipidemia Sister   . Alcohol abuse Brother   . Diabetes Brother   . Stroke Brother   . Hypertension Mother   . Diabetes Mother   . Heart disease Mother   . Diabetes Father   . Heart disease Father   . Hypertension Father   . Hypertension Sister   . Cancer Maternal Grandmother        Unsure  . Diabetes Maternal Grandfather   . Colon cancer Neg Hx   . Liver disease Neg Hx   . Breast cancer Neg Hx     Social History   Tobacco Use  . Smoking status: Former Smoker    Packs/day: 1.00    Years: 40.00    Pack years: 40.00    Types: Cigarettes    Start date: 09/04/1972    Quit date: 12/30/2012    Years since quitting: 7.1  . Smokeless tobacco: Never Used  . Tobacco comment: smoking cessation materials not required  Substance Use Topics  . Alcohol use: No    Alcohol/week: 0.0 standard drinks     Current Outpatient Medications:  .  ACCU-CHEK FASTCLIX LANCETS  MISC, , Disp: , Rfl:  .  ACCU-CHEK SMARTVIEW test strip, Check fsbs two times daily  DMII, Disp: 100 each, Rfl: 6 .  Alcohol Swabs (B-D SINGLE USE SWABS REGULAR) PADS, 1 each by Does not apply route 4 (four) times daily., Disp: 200 each, Rfl: 2 .  allopurinol (ZYLOPRIM) 100 MG tablet, Take 1 tablet (100 mg total) by mouth daily., Disp: 90 tablet, Rfl: 1 .  amLODipine (NORVASC) 5 MG tablet, Take 1 tablet (5 mg total) by mouth daily., Disp: 90 tablet, Rfl: 1 .  aspirin EC 81 MG tablet, Take 81 mg by mouth every morning. , Disp: , Rfl:  .  BD INSULIN SYRINGE U/F 31G X 5/16" 0.3 ML MISC, , Disp: , Rfl:  .  blood glucose meter kit and supplies KIT, Dispense based on patient and insurance preference. Use up to four times daily as directed. (FOR ICD-9 250.00, 250.01)., Disp: 1 each, Rfl: 0 .  Cholecalciferol (VITAMIN D-3 PO), Take 1,000  Units by mouth daily. , Disp: , Rfl:  .  CINNAMON PO, Take 1,000 mg by mouth daily., Disp: , Rfl:  .  cloNIDine (CATAPRES) 0.1 MG tablet, Take 1 tablet (0.1 mg total) by mouth every evening., Disp: 90 tablet, Rfl: 1 .  Coenzyme Q10 100 MG capsule, Take 1 capsule (100 mg total) by mouth daily., Disp: 90 capsule, Rfl: 1 .  DENTA 5000 PLUS 1.1 % CREA dental cream, , Disp: , Rfl:  .  Dulaglutide (TRULICITY [place]), Inject into the skin. Inject SQ once weekly, Disp: , Rfl:  .  Ferrous Sulfate (IRON) 325 (65 Fe) MG TABS, Take 325 mg by mouth daily. Nature made brand-, Disp: , Rfl:  .  fluticasone (FLONASE) 50 MCG/ACT nasal spray, PLACE 2 SPRAYS INTO BOTH NOSTRILS DAILY., Disp: 48 g, Rfl: 2 .  Fluticasone-Salmeterol (ADVAIR) 100-50 MCG/DOSE AEPB, Inhale 1 puff into the lungs 2 (two) times daily., Disp: , Rfl:  .  Insulin Glargine (BASAGLAR KWIKPEN) 100 UNIT/ML SOPN, Inject 60 Units into the skin at bedtime., Disp: , Rfl:  .  Insulin Pen Needle (FIFTY50 PEN NEEDLES) 31G X 5 MM MISC, Use once daily, Disp: , Rfl:  .  isosorbide mononitrate (IMDUR) 60 MG 24 hr tablet, Take 1 tablet (60 mg total) by mouth daily., Disp: 90 tablet, Rfl: 1 .  Lancets 28G MISC, Use 1 each 2 (two) times daily accu chek. Diagnosis: E11.22, Disp: , Rfl:  .  lisinopril (ZESTRIL) 20 MG tablet, Take 1 tablet (20 mg total) by mouth daily., Disp: 90 tablet, Rfl: 1 .  loratadine (CLARITIN) 10 MG tablet, Take 1 tablet (10 mg total) by mouth daily., Disp: 90 tablet, Rfl: 1 .  metoCLOPramide (REGLAN) 5 MG tablet, TAKE 1-2 TABLETS (5-10 MG TOTAL) FOUR TIMES DAILY   BEFORE MEALS AND AT BEDTIME., Disp: 120 tablet, Rfl: 0 .  metoprolol succinate (TOPROL-XL) 50 MG 24 hr tablet, Take 1 tablet (50 mg total) by mouth daily., Disp: 90 tablet, Rfl: 3 .  montelukast (SINGULAIR) 10 MG tablet, Take 1 tablet (10 mg total) by mouth every evening., Disp: 90 tablet, Rfl: 1 .  pantoprazole (PROTONIX) 40 MG tablet, Take 1 tablet (40 mg total) by mouth every  morning., Disp: 90 tablet, Rfl: 1 .  rosuvastatin (CRESTOR) 20 MG tablet, Take 1 tablet (20 mg total) by mouth daily., Disp: 90 tablet, Rfl: 1 .  SYNJARDY XR 12.01-999 MG TB24, Take 2 tablets by mouth daily., Disp: , Rfl:  .  umeclidinium-vilanterol (ANORO ELLIPTA) 62.5-25  MCG/INH AEPB, Inhale 1 puff into the lungs daily., Disp: 60 each, Rfl: 0 .  venlafaxine XR (EFFEXOR-XR) 75 MG 24 hr capsule, Take one tablet a day., Disp: 90 capsule, Rfl: 1  Allergies  Allergen Reactions  . Codeine Other (See Comments)    Unknown reaction  . Contrast Media [Iodinated Diagnostic Agents] Itching  . Sulfa Antibiotics Itching    I personally reviewed active problem list, medication list, allergies, family history, social history, health maintenance with the patient/caregiver today.   ROS  Constitutional: positive for intermittent  cough and shortness of breath.   Cardiovascular: Negative for chest pain or palpitations.  Gastrointestinal: Negative for abdominal pain, no bowel changes.  Musculoskeletal: Positive for gait problem and right knee  joint swelling.  Skin: Negative for rash.  Neurological: Positive  For intermittent  dizziness or headache.  No other specific complaints in a complete review of systems (except as listed in HPI above).  Objective  Vitals:   03/10/20 1001  BP: 100/60  Pulse: 93  Resp: 16  Temp: (!) 96.8 F (36 C)  TempSrc: Temporal  SpO2: 94%  Weight: 213 lb 3.2 oz (96.7 kg)  Height: '5\' 6"'  (1.676 m)    Body mass index is 34.41 kg/m.  Physical Exam  Constitutional: Patient appears well-developed and well-nourished. Obese No distress.  HEENT: head atraumatic, normocephalic, pupils equal and reactive to light,  neck supple Cardiovascular: Normal rate, regular rhythm and normal heart sounds.  No murmur heard. No BLE edema. Pulmonary/Chest: Effort normal and breath sounds normal. No respiratory distress. Abdominal: Soft.  There is no tenderness. Psychiatric: Patient  has a normal mood and affect. behavior is normal. Judgment and thought content normal. Muscular Skeletal: scar from previous knee surgery, decrease in flexion right knee and mild effusion   Recent Results (from the past 2160 hour(s))  Hemoglobin A1c     Status: None   Collection Time: 02/10/20  9:46 AM  Result Value Ref Range   Hemoglobin A1C 6.8      PHQ2/9: Depression screen [facility] 2/9 03/10/2020 12/11/2019 11/28/2019 11/13/2019 07/24/2019  Decreased Interest 0 0 0 0 0  Down, Depressed, Hopeless 0 0 0 0 0  PHQ - 2 Score 0 0 0 0 0  Altered sleeping 0 - 0 0 0  Tired, decreased energy 0 - 0 0 0  Change in appetite 0 - 0 0 0  Feeling bad or failure about yourself  0 - 0 0 0  Trouble concentrating 0 - 0 0 0  Moving slowly or fidgety/restless 0 - 0 0 0  Suicidal thoughts 0 - 0 0 0  PHQ-9 Score 0 - 0 0 0  Difficult doing work/chores - - - Not difficult at all -  Some recent data might be hidden    phq 9 is negative   Fall Risk: Fall Risk  03/10/2020 12/11/2019 11/28/2019 11/13/2019 10/16/2019  Falls in the past year? 0 0 0 0 0  Number falls in past yr: 0 0 0 0 0  Injury with Fall? 0 0 0 0 0  Comment - - - - -  Risk for fall due to : - No Fall Risks - - -  Follow up - Falls prevention discussed - Falls evaluation completed -    Assessment & Plan  1. Well controlled type 2 diabetes mellitus with gastroparesis ([place])  - POCT HgB A1C  2. Wound of skin  - Tdap vaccine greater than or equal to 7yo IM  3. Essential  hypertension  - CBC with Differential/Platelet  4. Angina pectoris ([place])  Controlled with medical management   5. Mild pulmonary hypertension (H[facility])  On echo   6. Gastroesophageal reflux disease without esophagitis  At goal   7. Dyslipidemia associated with type 2 diabetes mellitus ([facility])  - Lipid panel  8. Recurrent major depressive disorder, in partial remission ([place])  Continue medication   9. Obstructive sleep apnea syndrome  Needs to increase compliance    10. Simple chronic bronchitis (H[facility])  Stable   11. Claudication ([place])  Needs to walk more  12. Dyslipidemia   13. Hypertension, benign  - amLODipine (NORVASC) 2.5 MG tablet; Take 1 tablet (2.5 mg total) by mouth daily. Stop 5 mg dose  Dispense: 90 tablet; Refill: 0

## 2020-03-15 ENCOUNTER — Ambulatory Visit: Admission: RE | Admit: 2020-03-15 | Payer: Medicare HMO | Source: Ambulatory Visit

## 2020-03-17 DIAGNOSIS — G4733 Obstructive sleep apnea (adult) (pediatric): Secondary | ICD-10-CM | POA: Diagnosis not present

## 2020-03-24 ENCOUNTER — Other Ambulatory Visit: Payer: Self-pay | Admitting: Family Medicine

## 2020-03-24 DIAGNOSIS — J3089 Other allergic rhinitis: Secondary | ICD-10-CM

## 2020-03-24 NOTE — Telephone Encounter (Signed)
Requested Prescriptions  Pending Prescriptions Disp Refills  . loratadine (CLARITIN) 10 MG tablet [Pharmacy Med Name: LORATADINE 10 MG Tablet] 90 tablet 1    Sig: TAKE 1 TABLET EVERY DAY     Ear, Nose, and Throat:  Antihistamines Passed - 03/24/2020  4:20 PM      Passed - Valid encounter within last 12 months    Recent Outpatient Visits          2 weeks ago Dyslipidemia associated with type 2 diabetes mellitus [facility])   [facility] Steele Sizer, MD   3 months ago Subacute sinusitis, unspecified location   [facility] Steele Sizer, MD   4 months ago Diabetes mellitus with gastroparesis [facility])   [facility] Steele Sizer, MD   5 months ago Simple chronic bronchitis [facility])   [facility] Steele Sizer, MD   8 months ago Simple chronic bronchitis [facility])   [facility] Steele Sizer, MD      Future Appointments            In 3 months Ancil Boozer, Drue Stager, MD [facility], [place]   In 8 months  [facility], [facility]

## 2020-04-08 ENCOUNTER — Other Ambulatory Visit: Payer: Self-pay | Admitting: Family Medicine

## 2020-04-08 DIAGNOSIS — I10 Essential (primary) hypertension: Secondary | ICD-10-CM

## 2020-04-12 ENCOUNTER — Other Ambulatory Visit: Payer: Self-pay | Admitting: Family Medicine

## 2020-04-12 ENCOUNTER — Telehealth: Payer: Self-pay | Admitting: Family Medicine

## 2020-04-12 MED ORDER — FLUCONAZOLE 150 MG PO TABS: 150.0000 mg | ORAL_TABLET | ORAL | 0 refills | Status: DC

## 2020-04-12 NOTE — Telephone Encounter (Signed)
Pt requesting a Rx of Fluconazole (diflucan). Pt sates Dr Ancil Boozer is aware she has this problem from time to time.  Pt states she needs prescription asap.  Declined appt tomorrow, she says she cannot wait until tomorrow.   [id number] [address], [place] Phone:  [phone]  Fax:  [phone]

## 2020-04-12 NOTE — Telephone Encounter (Signed)
Patient called.  Patient aware.  

## 2020-04-13 ENCOUNTER — Telehealth: Payer: Self-pay | Admitting: *Deleted

## 2020-04-13 NOTE — Telephone Encounter (Signed)
(  04/13/2020) Pt notified that lung cancer screening imaging is due currently or in the near future. Verified smoking history (Former Smoker since 2014, 1 ppd). Tentative appt for 04/19/20 @ 11 am SRW

## 2020-04-16 DIAGNOSIS — G4733 Obstructive sleep apnea (adult) (pediatric): Secondary | ICD-10-CM | POA: Diagnosis not present

## 2020-04-17 DIAGNOSIS — G4733 Obstructive sleep apnea (adult) (pediatric): Secondary | ICD-10-CM | POA: Diagnosis not present

## 2020-04-19 ENCOUNTER — Other Ambulatory Visit: Payer: Self-pay

## 2020-04-19 ENCOUNTER — Ambulatory Visit
Admission: RE | Admit: 2020-04-19 | Discharge: 2020-04-19 | Disposition: A | Discharge status: Home / Self Care | Payer: Medicare HMO | Source: Ambulatory Visit | Attending: Oncology | Admitting: Oncology

## 2020-04-19 DIAGNOSIS — Z122 Encounter for screening for malignant neoplasm of respiratory organs: Secondary | ICD-10-CM | POA: Insufficient documentation

## 2020-04-19 DIAGNOSIS — Z87891 Personal history of nicotine dependence: Secondary | ICD-10-CM | POA: Diagnosis not present

## 2020-04-20 ENCOUNTER — Other Ambulatory Visit: Payer: Self-pay

## 2020-04-20 DIAGNOSIS — J302 Other seasonal allergic rhinitis: Secondary | ICD-10-CM

## 2020-04-20 MED ORDER — MONTELUKAST SODIUM 10 MG PO TABS: 10.0000 mg | ORAL_TABLET | Freq: Every evening | ORAL | 1 refills | Status: DC

## 2020-04-21 ENCOUNTER — Other Ambulatory Visit: Payer: Self-pay

## 2020-04-21 ENCOUNTER — Ambulatory Visit
Admission: RE | Admit: 2020-04-21 | Discharge: 2020-04-21 | Disposition: A | Discharge status: Home / Self Care | Payer: Medicare HMO | Source: Ambulatory Visit | Attending: Family Medicine | Admitting: Family Medicine

## 2020-04-21 DIAGNOSIS — Z1231 Encounter for screening mammogram for malignant neoplasm of breast: Secondary | ICD-10-CM | POA: Diagnosis not present

## 2020-04-22 ENCOUNTER — Encounter: Payer: Self-pay | Admitting: *Deleted

## 2020-04-26 ENCOUNTER — Other Ambulatory Visit: Payer: Self-pay | Admitting: Family Medicine

## 2020-04-26 DIAGNOSIS — R61 Generalized hyperhidrosis: Secondary | ICD-10-CM

## 2020-04-26 MED ORDER — CLONIDINE HCL 0.1 MG PO TABS: 0.1000 mg | ORAL_TABLET | Freq: Every evening | ORAL | 1 refills | Status: DC

## 2020-04-26 NOTE — Telephone Encounter (Signed)
Pt request refill  cloNIDine (CATAPRES) 0.1 MG tablet  She is completely out, needs a 30 day sent to  [id number] [place], [place] Phone:  [phone]  Fax:  [phone]     Then a 90 day to [place], [place] Phone:  [phone]  Fax:  [phone]

## 2020-04-27 MED ORDER — CLONIDINE HCL 0.1 MG PO TABS: 0.1000 mg | ORAL_TABLET | Freq: Every evening | ORAL | 1 refills | Status: DC

## 2020-04-27 NOTE — Addendum Note (Signed)
Addended by: Chilton Greathouse on: 04/27/2020 09:48 AM   Modules accepted: Orders

## 2020-04-27 NOTE — Telephone Encounter (Signed)
She is completely out of medication she needs a supply sent to her local and mail order pharmacy.

## 2020-04-27 NOTE — Telephone Encounter (Signed)
Pt called and asked if the 90 day supply can be sent to [place] [facility] and a 30 days supply [facility] since she is out of cloNIDine (CATAPRES) 0.1 MG tablet  medication / Please advise

## 2020-04-27 NOTE — Addendum Note (Signed)
Addended by: Chilton Greathouse on: 04/27/2020 12:03 PM   Modules accepted: Orders

## 2020-04-27 NOTE — Addendum Note (Signed)
Addended by: Steele Sizer F on: 04/27/2020 01:21 PM   Modules accepted: Orders

## 2020-04-28 DIAGNOSIS — L851 Acquired keratosis [keratoderma] palmaris et plantaris: Secondary | ICD-10-CM | POA: Diagnosis not present

## 2020-04-28 DIAGNOSIS — B351 Tinea unguium: Secondary | ICD-10-CM | POA: Diagnosis not present

## 2020-04-28 DIAGNOSIS — E1143 Type 2 diabetes mellitus with diabetic autonomic (poly)neuropathy: Secondary | ICD-10-CM | POA: Diagnosis not present

## 2020-05-11 ENCOUNTER — Other Ambulatory Visit: Payer: Self-pay | Admitting: Family Medicine

## 2020-05-11 DIAGNOSIS — F330 Major depressive disorder, recurrent, mild: Secondary | ICD-10-CM

## 2020-05-11 NOTE — Telephone Encounter (Signed)
Requested Prescriptions  Pending Prescriptions Disp Refills   venlafaxine XR (EFFEXOR-XR) 75 MG 24 hr capsule [Pharmacy Med Name: VENLAFAXINE HYDROCHLORIDEER 75 MG Capsule Extended Release 24 Hour] 90 capsule 0    Sig: TAKE 1 CAPSULE EVERY DAY     Psychiatry: Antidepressants - SNRI - desvenlafaxine & venlafaxine Passed - 05/11/2020  3:25 PM      Passed - LDL in normal range and within 360 days    LDL Cholesterol (Calc)  Date Value Ref Range Status  03/10/2020 56 mg/dL (calc) Final    Comment:    Reference range: <100 . Desirable range <100 mg/dL for primary prevention;   <70 mg/dL for patients with CHD or diabetic patients  with > or = 2 CHD risk factors. Marland Kitchen LDL-C is now calculated using the Martin-Hopkins  calculation, which is a validated novel method providing  better accuracy than the Friedewald equation in the  estimation of LDL-C.  Cresenciano Genre et al. Annamaria Helling. 2641;583(09): 2061-2068  (http://education.[employer].com/faq/FAQ164)          Passed - Total Cholesterol in normal range and within 360 days    Cholesterol, Total  Date Value Ref Range Status  06/28/2015 143 100 - 199 mg/dL Final   Cholesterol  Date Value Ref Range Status  03/10/2020 126 <200 mg/dL Final         Passed - Triglycerides in normal range and within 360 days    Triglycerides  Date Value Ref Range Status  03/10/2020 89 <150 mg/dL Final         Passed - Completed PHQ-2 or PHQ-9 in the last 360 days.      Passed - Last BP in normal range    BP Readings from Last 1 Encounters:  03/10/20 100/60         Passed - Valid encounter within last 6 months    Recent Outpatient Visits          2 months ago Dyslipidemia associated with type 2 diabetes mellitus [facility])   [facility] Steele Sizer, MD   5 months ago Subacute sinusitis, unspecified location   [facility] Steele Sizer, MD   6 months ago Diabetes mellitus with gastroparesis [facility])   [facility] Steele Sizer, MD   6 months ago Simple chronic bronchitis [facility])   [facility] Steele Sizer, MD   9 months ago Simple chronic bronchitis [facility])   [facility] Steele Sizer, MD      Future Appointments            In 2 months Ancil Boozer, Drue Stager, MD [facility], [place]   In 7 months  [facility], [facility]

## 2020-05-11 NOTE — Telephone Encounter (Signed)
Requested medications are due for refill today?  Unknown.  Not on active medication list.  Fluticasone - Salmeterol (Advair) 100-50 is on the active medication list as a historical medication.    Requested medications are on active medication list?  Not listed on medication list.   .   Last Refill:   Not on medication list.    Future visit scheduled? Yes in 2 months.    Notes to Clinic:  This medication is not listed on active medication list.  Fluticasone - Salmeterol (Advair) 100/50 is on active medication list and is listed as a historical med.

## 2020-05-11 NOTE — Telephone Encounter (Signed)
Requested medications are due for refill today?  Unknown.  This medication strength is not on active medication list.  Fluticasone - Salmeterol (Adair) 100 - 37 is on active medication list as a historical medication.    Requested medications are on active medication list? No  Last Refill:  Unknown.    Future visit scheduled?  Yes   Notes to Clinic:  This medication and strength is not listed on patient's active medication list.  Please review.

## 2020-05-17 ENCOUNTER — Ambulatory Visit (INDEPENDENT_AMBULATORY_CARE_PROVIDER_SITE_OTHER): Payer: Medicare HMO | Admitting: Internal Medicine

## 2020-05-17 ENCOUNTER — Encounter: Payer: Self-pay | Admitting: Internal Medicine

## 2020-05-17 DIAGNOSIS — M62838 Other muscle spasm: Secondary | ICD-10-CM

## 2020-05-17 DIAGNOSIS — B379 Candidiasis, unspecified: Secondary | ICD-10-CM

## 2020-05-17 DIAGNOSIS — M542 Cervicalgia: Secondary | ICD-10-CM

## 2020-05-17 DIAGNOSIS — J329 Chronic sinusitis, unspecified: Secondary | ICD-10-CM | POA: Diagnosis not present

## 2020-05-17 MED ORDER — FLUCONAZOLE 150 MG PO TABS: 150.0000 mg | ORAL_TABLET | Freq: Once | ORAL | 0 refills | Status: AC

## 2020-05-17 MED ORDER — CYCLOBENZAPRINE HCL 5 MG PO TABS: 5.0000 mg | ORAL_TABLET | Freq: Every evening | ORAL | 0 refills | Status: DC | PRN

## 2020-05-17 MED ORDER — AMOXICILLIN-POT CLAVULANATE 875-125 MG PO TABS: 1.0000 | ORAL_TABLET | Freq: Two times a day (BID) | ORAL | 0 refills | Status: AC

## 2020-05-17 NOTE — Progress Notes (Signed)
Name: Dana Sparks   MRN: [id number]    DOB: [birth year]   Date:05/17/2020       Progress Note  Subjective  Chief Complaint  Chief Complaint  Patient presents with  . Sore Throat    Onset 2 days ago  . Neck Pain    she says "it feels like my neck is going to come off"  . Sinus pressure    I connected with  [name] on 05/17/20 at 11:20 AM EDT by telephone and verified that I am speaking with the correct person using two identifiers.  I discussed the limitations, risks, security and privacy concerns of performing an evaluation and management service by telephone and the availability of in person appointments. The patient expressed understanding and agreed to proceed. Staff also discussed with the patient that there may be a patient responsible charge related to this service. Patient Location: [facility] doing flowers Provider Location: [facility] Additional Individuals present: none  HPI  Patient is a 69 year old female patient of Dr. Ancil Boozer Last visit with her was 03/10/2020 Presents today via phone visit for sore throat, sinus pressure and neck discomfort Noted limitations with this being a phone visit  The sx's started 2 days ago + neck pain looking right, not up and down, not rigid, noted in the back on the right hand side about half way towards the shoulder, she notes at times it can feel like a cramp/spasm. + muscle cramping in legs and arms, intermittent and more left sided.  Denies weakness + minimal cough, no production No marked SOB No fever,  + sore throat - just a little bit + congestion in sinuses, minimal PND - can be yellowish No  loss of smell, loss of taste No N/V Denies marked myalgias No marked loose stools/diarrhea No CP, passing out episodes Had Covid vaccination, last dose 11/01/19, she is not concerned is Covid   Comorbid conditions reviewed + h/o chronic bronchitis + DM,  + h/o heart disease,  + CKD, + obesity   Noted neck is most bothersome right  now, intermittently is more problematic, takes ibuprofen and helps but not supposed to take ibuprofen. Tylenol not stop the pain - 2  500 mg tabs. 2 ibuprofen helpful. Used the cold and not help as much as hot.   Patient Active Problem List   Diagnosis Date Noted  . Chronic bronchitis ([place]) 02/22/2020  . Disease of stomach   . Hx of colonic polyps   . Hepatomegaly 07/23/2018  . Fatty liver 07/23/2018  . Aortic atherosclerosis ([place]) 07/09/2018  . Type II or unspecified type diabetes mellitus with neurological manifestations, not stated as uncontrolled(250.60) 09/03/2017  . Nasal polyp 09/03/2017  . Lipoma of back 09/03/2017  . Claudication ([place]) 06/04/2017  . LVH (left ventricular hypertrophy) 02/27/2017  . Decreased cardiac ejection fraction 02/27/2017  . Left hand weakness 10/12/2016  . Elevated antinuclear antibody (ANA) level 04/27/2016  . Angina pectoris ([place]) 11/01/2015  . Well controlled type 2 diabetes mellitus with gastroparesis ([place]) 06/22/2015  . Low TSH level 06/22/2015  . Iron deficiency anemia 04/07/2015  . Intestinal metaplasia of gastric mucosa 03/11/2015  . CAD (coronary artery disease), native coronary artery 02/24/2015  . History of coronary artery stent placement 02/24/2015  . Chronic kidney disease, stage 3, mod decreased GFR 02/24/2015  . Menopause 02/24/2015  . History of Helicobacter pylori infection 02/24/2015  . Mild mitral insufficiency 02/24/2015  . Mild tricuspid insufficiency 02/24/2015  . Diabetic frozen shoulder associated  with type 2 diabetes mellitus ([place]) 02/24/2015  . Controlled gout 02/24/2015  . Depression, major, recurrent, mild ([place]) 02/24/2015  . Mild pulmonary hypertension ([place]) 02/24/2015  . Gastroesophageal reflux disease without esophagitis 02/24/2015  . Perennial allergic rhinitis 02/24/2015  . HLD (hyperlipidemia) 02/20/2015  . Hypertension, benign 02/20/2015  . Apnea, sleep 02/20/2015  . Controlled diabetes mellitus with stage 3  chronic kidney disease, without long-term current use of insulin ([place]) 02/20/2015    Past Surgical History:  Procedure Laterality Date  . ABDOMINAL HYSTERECTOMY     total  . CATARACT EXTRACTION    . CATARACT EXTRACTION W/PHACO Left 03/24/2019   Procedure: CATARACT EXTRACTION PHACO AND INTRAOCULAR LENS PLACEMENT ([place])  LEFT DIABETIC;  Surgeon: Eulogio Bear, MD;  Location: [facility];  Service: Ophthalmology;  Laterality: Left;  Diabetic - insulin and oral meds sleep apnea  . COLONOSCOPY  01/2014   sigmoid diverticulosis. desc colon TA, hyperplastic polyp  . COLONOSCOPY WITH PROPOFOL N/A 09/02/2018   Procedure: COLONOSCOPY WITH PROPOFOL;  Surgeon: Lucilla Lame, MD;  Location: [place];  Service: Endoscopy;  Laterality: N/A;  . CORONARY ANGIOPLASTY WITH STENT PLACEMENT    . ESOPHAGOGASTRODUODENOSCOPY N/A 02/16/2015   negative h pylori, focal gastic intestinal metaplasia  . ESOPHAGOGASTRODUODENOSCOPY (EGD) WITH PROPOFOL N/A 09/02/2018   Procedure: ESOPHAGOGASTRODUODENOSCOPY (EGD) WITH BIOPSIES;  Surgeon: Lucilla Lame, MD;  Location: [facility];  Service: Endoscopy;  Laterality: N/A;  diabetic -insulin and oral meds sleep apnea  . TOTAL KNEE ARTHROPLASTY Right     Family History  Problem Relation Age of Onset  . Stroke Sister   . Hyperlipidemia Sister   . Alcohol abuse Brother   . Diabetes Brother   . Stroke Brother   . Hypertension Mother   . Diabetes Mother   . Heart disease Mother   . Diabetes Father   . Heart disease Father   . Hypertension Father   . Hypertension Sister   . Cancer Maternal Grandmother        Unsure  . Diabetes Maternal Grandfather   . Colon cancer Neg Hx   . Liver disease Neg Hx   . Breast cancer Neg Hx     Social History   Tobacco Use  . Smoking status: Former Smoker    Packs/day: 1.00    Years: 40.00    Pack years: 40.00    Types: Cigarettes    Start date: 09/04/1972    Quit date: 12/30/2012    Years since  quitting: 7.3  . Smokeless tobacco: Never Used  . Tobacco comment: smoking cessation materials not required  Substance Use Topics  . Alcohol use: No    Alcohol/week: 0.0 standard drinks     Current Outpatient Medications:  .  ACCU-CHEK FASTCLIX LANCETS MISC, , Disp: , Rfl:  .  ACCU-CHEK SMARTVIEW test strip, Check fsbs two times daily  DMII, Disp: 100 each, Rfl: 6 .  Alcohol Swabs (B-D SINGLE USE SWABS REGULAR) PADS, 1 each by Does not apply route 4 (four) times daily., Disp: 200 each, Rfl: 2 .  allopurinol (ZYLOPRIM) 100 MG tablet, TAKE 1 TABLET (100 MG TOTAL) BY MOUTH DAILY., Disp: 90 tablet, Rfl: 1 .  amLODipine (NORVASC) 2.5 MG tablet, Take 1 tablet (2.5 mg total) by mouth daily. Stop 5 mg dose, Disp: 90 tablet, Rfl: 0 .  aspirin EC 81 MG tablet, Take 81 mg by mouth every morning. , Disp: , Rfl:  .  BD INSULIN SYRINGE U/F 31G X 5/16" 0.3 ML  MISC, , Disp: , Rfl:  .  blood glucose meter kit and supplies KIT, Dispense based on patient and insurance preference. Use up to four times daily as directed. (FOR ICD-9 250.00, 250.01)., Disp: 1 each, Rfl: 0 .  Cholecalciferol (VITAMIN D-3 PO), Take 1,000 Units by mouth daily. , Disp: , Rfl:  .  CINNAMON PO, Take 1,000 mg by mouth daily., Disp: , Rfl:  .  cloNIDine (CATAPRES) 0.1 MG tablet, Take 1 tablet (0.1 mg total) by mouth every evening., Disp: 90 tablet, Rfl: 1 .  Coenzyme Q10 100 MG capsule, Take 1 capsule (100 mg total) by mouth daily., Disp: 90 capsule, Rfl: 1 .  DENTA 5000 PLUS 1.1 % CREA dental cream, , Disp: , Rfl:  .  Dulaglutide (TRULICITY [place]), Inject into the skin. Inject SQ once weekly, Disp: , Rfl:  .  Ferrous Sulfate (IRON) 325 (65 Fe) MG TABS, Take 325 mg by mouth daily. Nature made brand-, Disp: , Rfl:  .  fluticasone (FLONASE) 50 MCG/ACT nasal spray, PLACE 2 SPRAYS INTO BOTH NOSTRILS DAILY., Disp: 48 g, Rfl: 2 .  Insulin Glargine (BASAGLAR KWIKPEN) 100 UNIT/ML SOPN, Inject 60 Units into the skin at bedtime., Disp: , Rfl:  .   Insulin Pen Needle (FIFTY50 PEN NEEDLES) 31G X 5 MM MISC, Use once daily, Disp: , Rfl:  .  isosorbide mononitrate (IMDUR) 60 MG 24 hr tablet, Take 1 tablet (60 mg total) by mouth daily., Disp: 90 tablet, Rfl: 1 .  Lancets 28G MISC, Use 1 each 2 (two) times daily accu chek. Diagnosis: E11.22, Disp: , Rfl:  .  lisinopril (ZESTRIL) 20 MG tablet, TAKE 1 TABLET (20 MG TOTAL) BY MOUTH DAILY., Disp: 90 tablet, Rfl: 1 .  loratadine (CLARITIN) 10 MG tablet, TAKE 1 TABLET EVERY DAY, Disp: 90 tablet, Rfl: 1 .  metoCLOPramide (REGLAN) 5 MG tablet, TAKE 1-2 TABLETS (5-10 MG TOTAL) FOUR TIMES DAILY   BEFORE MEALS AND AT BEDTIME., Disp: 120 tablet, Rfl: 0 .  metoprolol succinate (TOPROL-XL) 50 MG 24 hr tablet, Take 1 tablet (50 mg total) by mouth daily., Disp: 90 tablet, Rfl: 3 .  montelukast (SINGULAIR) 10 MG tablet, Take 1 tablet (10 mg total) by mouth every evening., Disp: 90 tablet, Rfl: 1 .  pantoprazole (PROTONIX) 40 MG tablet, Take 1 tablet (40 mg total) by mouth every morning., Disp: 90 tablet, Rfl: 1 .  rosuvastatin (CRESTOR) 20 MG tablet, Take 1 tablet (20 mg total) by mouth daily., Disp: 90 tablet, Rfl: 1 .  SYNJARDY XR 12.01-999 MG TB24, Take 2 tablets by mouth daily., Disp: , Rfl:  .  venlafaxine XR (EFFEXOR-XR) 75 MG 24 hr capsule, TAKE 1 CAPSULE EVERY DAY, Disp: 90 capsule, Rfl: 0 .  WIXELA INHUB 250-50 MCG/DOSE AEPB, INHALE 1 PUFF INTO THE LUNGS 2 (TWO) TIMES DAILY., Disp: 180 each, Rfl: 1  Allergies  Allergen Reactions  . Codeine Other (See Comments)    Unknown reaction  . Contrast Media [Iodinated Diagnostic Agents] Itching  . Sulfa Antibiotics Itching    With staff assistance, above reviewed with the patient today.  ROS: As per HPI, otherwise no specific complaints on a limited and focused system review   Objective  Virtual encounter, vitals not obtained.  There is no height or weight on file to calculate BMI.  Physical Exam   Appears in NAD via conversation, did have a raspy  voice Breathing: No obvious respiratory distress. Speaking in complete sentences Neurological: Pt is alert,  Speech is normal Psychiatric:  Patient has a normal mood and affect. Judgment and thought content normal.   No results found for this or any previous visit (from the past 72 hour(s)).  PHQ2/9: Depression screen [facility] 2/9 05/17/2020 03/10/2020 12/11/2019 11/28/2019 11/13/2019  Decreased Interest 0 0 0 0 0  Down, Depressed, Hopeless 0 0 0 0 0  PHQ - 2 Score 0 0 0 0 0  Altered sleeping - 0 - 0 0  Tired, decreased energy - 0 - 0 0  Change in appetite - 0 - 0 0  Feeling bad or failure about yourself  - 0 - 0 0  Trouble concentrating - 0 - 0 0  Moving slowly or fidgety/restless - 0 - 0 0  Suicidal thoughts - 0 - 0 0  PHQ-9 Score - 0 - 0 0  Difficult doing work/chores - - - - Not difficult at all  Some recent data might be hidden   PHQ-2/9 Result reviewed  Fall Risk: Fall Risk  05/17/2020 03/10/2020 12/11/2019 11/28/2019 11/13/2019  Falls in the past year? 0 0 0 0 0  Number falls in past yr: 0 0 0 0 0  Injury with Fall? 0 0 0 0 0  Comment - - - - -  Risk for fall due to : - - No Fall Risks - -  Follow up Falls evaluation completed - Falls prevention discussed - Falls evaluation completed     Assessment & Plan  1. Sinusitis, unspecified chronicity, unspecified location Discussed with patient that her symptoms seem more likely an upper respiratory infection, likely viral, discussed symptomatic measures to help manage.  She made it clear that she needed an antibiotic for this, that it is a sinus infection, and that Dr. Ancil Boozer usually prescribes her antibiotic and also a medicine because she gets a yeast infection when she takes the antibiotic.  She was quite insistent that this needed to be prescribed to her pharmacy. She notes she does not have allergies to antibiotics, told me the pharmacy she wants this to be sent to. Augmentin twice daily was prescribed. - amoxicillin-clavulanate  (AUGMENTIN) 875-125 MG tablet; Take 1 tablet by mouth 2 (two) times daily for 7 days.  Dispense: 14 tablet; Refill: 0  2. Neck pain 3. Neck muscle spasm 4. Trapezius muscle spasm Discussed management of her neck muscle discomfort/spasm, and emphasized warm compresses/warmth to the area, followed by range of motion exercises to help, Also will add a muscle relaxant to use at bedtime, would not use during the day as may make drowsy.  Will prescribe a lower dose, 5 mg of Flexeril. She can use the ibuprofen product, over-the-counter strength that she is doing acutely, and emphasized not to take this with any regularity over time, but okay for the next 2 to 3 days to help with symptoms, as she notes the Tylenol has not been helpful. Will not prescribe a stronger dose of an NSAID in her history, and emphasized limited and minimal use of this in the short-term. - cyclobenzaprine (FLEXERIL) 5 MG tablet; Take 1 tablet (5 mg total) by mouth at bedtime as needed for muscle spasms. May increase to two tabs at bedtime pending response  Dispense: 10 tablet; Refill: 0  5. Yeast infection concern in the past with antibiotic use Okay to have a Diflucan product to use if she develops any concerns for yeast infection like she often does with an antibiotic, and she noted that is always prescribed for her as well. - fluconazole (DIFLUCAN) 150 MG tablet;  Take 1 tablet (150 mg total) by mouth once for 1 dose. Use for yeast infection concerns.  Dispense: 1 tablet; Refill: 0  I noted with the current pandemic that Covid is always in our differential, and the importance of getting a test done for Covid if her symptoms persist or more concerning symptoms develop such as fevers, loss of taste or smell, increasing cough.  She noted she had the vaccine, and was not concerned this was Covid, and noted that it is possible but it is important to get tested over time if her symptoms are not improving or more problematic.  I  discussed the assessment and treatment plan with the patient. The patient was provided an opportunity to ask questions and all were answered. The patient agreed with the plan and demonstrated an understanding of the instructions.  Red flags and when to present for emergency care or RTC including fevers, chest pain, shortness of breath, new/worsening/un-resolving symptoms reviewed with patient at time of visit.   The patient was advised to call back or seek an in-person evaluation if the symptoms worsen or if the condition fails to improve as anticipated.  I provided 20 minutes of non-face-to-face time during this encounter that included discussing at length patient's sx/history, pertinent pmhx, medications, treatment and follow up plan. This time also included the necessary documentation, orders, and chart review.  Towanda Malkin, MD

## 2020-05-18 DIAGNOSIS — G4733 Obstructive sleep apnea (adult) (pediatric): Secondary | ICD-10-CM | POA: Diagnosis not present

## 2020-05-26 ENCOUNTER — Other Ambulatory Visit: Payer: Self-pay

## 2020-05-26 ENCOUNTER — Ambulatory Visit: Payer: Self-pay | Admitting: Pharmacist

## 2020-05-26 DIAGNOSIS — Z794 Long term (current) use of insulin: Secondary | ICD-10-CM

## 2020-05-26 DIAGNOSIS — J42 Unspecified chronic bronchitis: Secondary | ICD-10-CM

## 2020-05-26 NOTE — Chronic Care Management (AMB) (Signed)
Chronic Care Management Pharmacy  Name: Dana Sparks  MRN: [id number] DOB: 1950-10-27  Chief Complaint/ HPI  Dana Sparks,  69 y.o. , female presents for their Follow-Up CCM visit with the clinical pharmacist via telephone due to COVID-19 Pandemic.  PCP : Dana Sizer, MD  Their chronic conditions include: HTN, DM, COPD  Office Visits:NA  Consult Visit:NA  Medications: Outpatient Encounter Medications as of 05/26/2020  Medication Sig  . ACCU-CHEK FASTCLIX LANCETS MISC   . ACCU-CHEK SMARTVIEW test strip Check fsbs two times daily  DMII  . Alcohol Swabs (B-D SINGLE USE SWABS REGULAR) PADS 1 each by Does not apply route 4 (four) times daily.  Marland Kitchen allopurinol (ZYLOPRIM) 100 MG tablet TAKE 1 TABLET (100 MG TOTAL) BY MOUTH DAILY.  Marland Kitchen amLODipine (NORVASC) 2.5 MG tablet Take 1 tablet (2.5 mg total) by mouth daily. Stop 5 mg dose  . aspirin EC 81 MG tablet Take 81 mg by mouth every morning.   . BD INSULIN SYRINGE U/F 31G X 5/16" 0.3 ML MISC   . blood glucose meter kit and supplies KIT Dispense based on patient and insurance preference. Use up to four times daily as directed. (FOR ICD-9 250.00, 250.01).  . Cholecalciferol (VITAMIN D-3 PO) Take 1,000 Units by mouth daily.   Marland Kitchen CINNAMON PO Take 1,000 mg by mouth daily.  . cloNIDine (CATAPRES) 0.1 MG tablet Take 1 tablet (0.1 mg total) by mouth every evening.  . Coenzyme Q10 100 MG capsule Take 1 capsule (100 mg total) by mouth daily.  . cyclobenzaprine (FLEXERIL) 5 MG tablet Take 1 tablet (5 mg total) by mouth at bedtime as needed for muscle spasms. May increase to two tabs at bedtime pending response  . DENTA 5000 PLUS 1.1 % CREA dental cream   . Dulaglutide (TRULICITY [place]) Inject into the skin. Inject SQ once weekly  . Ferrous Sulfate (IRON) 325 (65 Fe) MG TABS Take 325 mg by mouth daily. Nature made brand-  . fluticasone (FLONASE) 50 MCG/ACT nasal spray PLACE 2 SPRAYS INTO BOTH NOSTRILS DAILY.  Marland Kitchen Insulin Glargine (BASAGLAR KWIKPEN) 100  UNIT/ML SOPN Inject 60 Units into the skin at bedtime.  . Insulin Pen Needle (FIFTY50 PEN NEEDLES) 31G X 5 MM MISC Use once daily  . isosorbide mononitrate (IMDUR) 60 MG 24 hr tablet Take 1 tablet (60 mg total) by mouth daily.  . Lancets 28G MISC Use 1 each 2 (two) times daily accu chek. Diagnosis: E11.22  . lisinopril (ZESTRIL) 20 MG tablet TAKE 1 TABLET (20 MG TOTAL) BY MOUTH DAILY.  Marland Kitchen loratadine (CLARITIN) 10 MG tablet TAKE 1 TABLET EVERY DAY  . metoCLOPramide (REGLAN) 5 MG tablet TAKE 1-2 TABLETS (5-10 MG TOTAL) FOUR TIMES DAILY   BEFORE MEALS AND AT BEDTIME.  . metoprolol succinate (TOPROL-XL) 50 MG 24 hr tablet Take 1 tablet (50 mg total) by mouth daily.  . montelukast (SINGULAIR) 10 MG tablet Take 1 tablet (10 mg total) by mouth every evening.  . pantoprazole (PROTONIX) 40 MG tablet Take 1 tablet (40 mg total) by mouth every morning.  . rosuvastatin (CRESTOR) 20 MG tablet Take 1 tablet (20 mg total) by mouth daily.  Marland Kitchen SYNJARDY XR 12.01-999 MG TB24 Take 2 tablets by mouth daily.  Marland Kitchen venlafaxine XR (EFFEXOR-XR) 75 MG 24 hr capsule TAKE 1 CAPSULE EVERY DAY  . WIXELA INHUB 250-50 MCG/DOSE AEPB INHALE 1 PUFF INTO THE LUNGS 2 (TWO) TIMES DAILY.   No facility-administered encounter medications on file as of 05/26/2020.  Financial Resource Strain: Low Risk   . Difficulty of Paying Living Expenses: Not very hard    Current Diagnosis/Assessment:  Goals Addressed              This Visit's Progress   .  Diabetes Mellitus - goal A1c < 7% (pt-stated)        CARE PLAN ENTRY (see longitudinal plan of care for additional care plan information)  Current Barriers:  . Diabetes: type 2; complicated by chronic medical conditions including COPD, HLD . Per Endocrinology, A1c = 6.8%, now controlled . Current antihyperglycemic regimen: Trulicity,Ozempic, Synjardy . Reports hypoglycemic symptoms 2-3 weekly, including dizziness, lightheadedness, shaking, sweating . Denies hyperglycemic  symptoms, including polyuria, polydipsia, polyphagia, nocturia, blurred vision, neuropathy . Current exercise: walking . Current blood glucose readings: typically in the 100 range . Cardiovascular risk reduction: o Current hypertensive regimen: metoprolol, lisinopril o Current hyperlipidemia regimen: Crestor o Current antiplatelet regimen: ASA 10m  Pharmacist Clinical Goal(s):  .Marland KitchenOver the next 90 days, patient will work with PharmD and primary care provider to address frequent hypoglycemia  Interventions: . Comprehensive medication review performed, medication list updated in electronic medical record . Inter-disciplinary care team collaboration (see longitudinal plan of care) . Once patient checks FSBG at various times daily, associate meals and snacks with those times  Patient Self Care Activities:  . Patient will check blood glucose twice daily for 2 weeks, document, and provide at future appointments . PPatient will take medications as prescribed . Patient will contact provider with any episodes of hypoglycemia . Patient will report any questions or concerns to provider   Initial goal documentation       Diabetes   Recent Relevant Labs: Lab Results  Component Value Date/Time   HGBA1C 6.8 02/10/2020 09:46 AM   HGBA1C 8.2 11/05/2019 12:00 AM   MICROALBUR 15.9 11/05/2019 12:00 AM   MICROALBUR 0.8 03/17/2019 08:54 AM   MICROALBUR 50 03/13/2016 09:38 AM   MICROALBUR 20 06/28/2015 09:10 AM     Checking BG: Daily  Recent pre-meal BG readings: 100 Patient is currently controlled on the following medications: cinnamon, Trulicity, Basaglar, Synjardy XR 12.5 - 10046m Last diabetic Foot exam:  Lab Results  Component Value Date/Time   HMDIABEYEEXA No Retinopathy 12/01/2019 12:00 AM   HMDIABEYEEXA No Retinopathy 12/01/2019 12:00 AM    Last diabetic Eye exam:  Lab Results  Component Value Date/Time   HMDIABFOOTEX Dr. TrMarjory Sneddonnd Fungal  09/23/2018 12:00 AM      We discussed:  At goal Denies hypoglycemia Does DM PAP at endo  Plan  Continue current medications   COPD / Asthma / Tobacco    Eosinophil count:   Lab Results  Component Value Date/Time   EOSPCT 3.3 03/10/2020 10:50 AM   EOSPCT 1.6 05/08/2012 11:28 AM  %                               Eos (Absolute):  Lab Results  Component Value Date/Time   EOSABS 221 03/10/2020 10:50 AM   EOSABS 0.3 11/01/2015 11:16 AM   EOSABS 0.2 05/08/2012 11:28 AM    Tobacco Status:  Social History   Tobacco Use  Smoking Status Former Smoker  . Packs/day: 1.00  . Years: 40.00  . Pack years: 40.00  . Types: Cigarettes  . Start date: 09/04/1972  . Quit date: 12/30/2012  . Years since quitting: 7.4  Smokeless Tobacco Never Used  Tobacco Comment  smoking cessation materials not required    Patient has failed these meds in past: Advair - cost Patient is currently controlled on the following medications: Wixela, Singulair, loratadine, Flonase Using maintenance inhaler regularly? Yes Frequency of rescue inhaler use:  never  We discussed:   Didn't get rescue inhaler Wixela isn't as good, but breathing OK  Plan  Continue current medications  Medication Management    We discussed:  Eligible for Covid-19 booster now, but Moderna not approved  Plan  Continue current medication management strategy  Follow up: 3 month phone visit  Milus Height, PharmD, Romilda Garret, [facility] [phone]

## 2020-05-27 NOTE — Patient Instructions (Addendum)
Visit Information  Goals Addressed              This Visit's Progress   .  Diabetes Mellitus - goal A1c < 7% (pt-stated)        CARE PLAN ENTRY (see longitudinal plan of care for additional care plan information)  Current Barriers:  . Diabetes: type 2; complicated by chronic medical conditions including COPD, HLD . Per Endocrinology, A1c = 6.8%, now controlled . Current antihyperglycemic regimen: Trulicity,Ozempic, Synjardy . Reports hypoglycemic symptoms 2-3 weekly, including dizziness, lightheadedness, shaking, sweating . Denies hyperglycemic symptoms, including polyuria, polydipsia, polyphagia, nocturia, blurred vision, neuropathy . Current exercise: walking . Current blood glucose readings: typically in the 100 range . Cardiovascular risk reduction: o Current hypertensive regimen: metoprolol, lisinopril o Current hyperlipidemia regimen: Crestor o Current antiplatelet regimen: ASA 81mg   Pharmacist Clinical Goal(s):  Marland Kitchen Over the next 90 days, patient will work with PharmD and primary care provider to address frequent hypoglycemia  Interventions: . Comprehensive medication review performed, medication list updated in electronic medical record . Inter-disciplinary care team collaboration (see longitudinal plan of care) . Once patient checks FSBG at various times daily, associate meals and snacks with those times  Patient Self Care Activities:  . Patient will check blood glucose twice daily for 2 weeks, document, and provide at future appointments . PPatient will take medications as prescribed . Patient will contact provider with any episodes of hypoglycemia . Patient will report any questions or concerns to provider   Initial goal documentation        Print copy of patient instructions provided.   Telephone follow up appointment with pharmacy team member scheduled for: 3 months  Milus Height, PharmD, [place], CTTS Clinical Pharmacist [facility] [phone]  Chronic Bronchitis, Adult Chronic bronchitis is long-lasting inflammation of the tubes that carry air into your lungs (bronchial tubes). This is inflammation that occurs:  On most days of the week.  For at least three months at a time.  Over a period of two years in a row. When the bronchial tubes are inflamed, they start to produce mucus. The inflammation and buildup of mucus make it more difficult to breathe. Chronic bronchitis is usually a permanent problem. It is one type of chronic obstructive pulmonary disease (COPD). People with chronic bronchitis are more likely to get frequent colds or respiratory infections. What are the causes? Chronic bronchitis most often occurs in people who:  Have chronic, severe asthma.  Have a history of smoking.  Have asthma and smoke.  Have certain lung diseases.  Have had long-term exposure to certain irritating fumes or chemicals. What are the signs or symptoms? Symptoms of chronic bronchitis may include:  A cough that brings up mucus (productive cough).  Shortness of breath.  Loud breathing (wheezing).  Chest discomfort.  Frequent (recurring) colds or respiratory infections. Certain things can trigger chronic bronchitis symptoms or make them worse, such as:  Infections.  Stopping certain medicines.  Smoking.  Exposure to chemicals. How is this diagnosed? This condition may be diagnosed based on:  Your symptoms and medical history.  A physical exam.  A chest X-ray.  Lung (pulmonary) function tests. How is this treated? There is no cure for chronic bronchitis, but treatment can help control your symptoms. Treatment may include:  Using a cool mist vaporizer or humidifier to make it easier to breathe.  Drinking more fluids. Drinking more makes your mucus thinner, which may make it easier to breathe.  Lifestyle changes, such as  eating a healthier diet and getting more exercise.  Medicines, such  as: ? Inhalers to improve air flow in and out of your lungs. ? Antibiotics to treat any bacterial infections you have, such as:  Lung infection (pneumonia).  Sinus infection.  A sudden, severe (acute) episode of bronchitis.  Oxygen therapy.  Preventing infections by keeping up to date on vaccinations, including the pneumonia and flu vaccines.  Pulmonary rehabilitation. This is a program that helps you manage your breathing problems and improve your quality of life. It may last for up to 4-12 weeks and may include exercise programs, education, counseling, and treatment support. Follow these instructions at home: Medicines  Take over-the-counter and prescription medicines only as told by your health care provider.  If you were prescribed an antibiotic medicine, take it as told by your health care provider. Do not stop taking the antibiotic even if you start to feel better. Preventing infections  Get vaccinations as told by your health care provider. Make sure you get a flu shot (influenza vaccine) every year.  Wash your hands often with soap and water. If soap and water are not available, use hand sanitizer.  Avoid contact with people who have symptoms of a cold or the flu. Managing symptoms   Do not smoke, and avoid secondhand smoke. Exposure to cigarette smoke or irritating chemicals will make bronchitis worse. If you smoke and you need help quitting, ask your health care provider. Quitting smoking will help your lungs heal faster.  Use an inhaler, cool mist vaporizer, or humidifier as told by your health care provider.  Avoid pollen, dust, animal dander, molds, smoke, and other things that cause shortness of breath or wheezing attacks.  Use oxygen therapy at home as directed. Follow instructions from your health care provider about how to use oxygen safely and take precautions to prevent fire. Make sure you never smoke while using oxygen or allow others to smoke in your  home.  Do not wait to get medical care if you have any concerning symptoms or trouble breathing. Waiting could cause permanent injury and may be life threatening. General instructions  Talk with your health care provider about what activities are safe for you and about possible exercise routines. Regular exercise is very important to help you feel better.  Drink enough fluids to keep your urine pale yellow.  Keep all follow-up visits as told by your health care provider. This is important. Contact a health care provider if:  You have coughing or shortness of breath that gets worse.  You have muscle aches.  You have chest pain.  Your mucus seems to get thicker.  Your mucus changes from clear or white to yellow, green, gray, or bloody. Get help right away if:  Your usual medicines do not stop your wheezing.  You have severe difficulty breathing. These symptoms may represent a serious problem that is an emergency. Do not wait to see if the symptoms will go away. Get medical help right away. Call your local emergency services (911 in the [place] Do not drive yourself to the hospital. Summary  Chronic bronchitis is long-lasting inflammation of the tubes that carry air into your lungs (bronchial tubes).  Chronic bronchitis is usually a permanent problem. It is one type of chronic obstructive pulmonary disease (COPD).  There is no cure for chronic bronchitis, but treatment can help control your symptoms.  Do not smoke, and avoid secondhand smoke. Exposure to cigarette smoke or irritating chemicals will make bronchitis worse.  This information is not intended to replace advice given to you by your health care provider. Make sure you discuss any questions you have with your health care provider. Document Revised: 06/13/2018 Document Reviewed: 07/11/2017 Elsevier Patient Education  [place].

## 2020-06-01 ENCOUNTER — Other Ambulatory Visit: Payer: Self-pay | Admitting: Family Medicine

## 2020-06-01 DIAGNOSIS — K219 Gastro-esophageal reflux disease without esophagitis: Secondary | ICD-10-CM

## 2020-06-01 DIAGNOSIS — M791 Myalgia, unspecified site: Secondary | ICD-10-CM

## 2020-06-01 DIAGNOSIS — I10 Essential (primary) hypertension: Secondary | ICD-10-CM

## 2020-06-01 DIAGNOSIS — E1143 Type 2 diabetes mellitus with diabetic autonomic (poly)neuropathy: Secondary | ICD-10-CM

## 2020-06-01 DIAGNOSIS — I209 Angina pectoris, unspecified: Secondary | ICD-10-CM

## 2020-06-01 DIAGNOSIS — E785 Hyperlipidemia, unspecified: Secondary | ICD-10-CM

## 2020-06-01 NOTE — Telephone Encounter (Signed)
Requested medication (s) are due for refill today: Metoclopramide, yes  Requested medication (s) are on the active medication list: yes  Last refill:  12/17/19  Future visit scheduled: yes  Notes to clinic: not delegated  Requested medication (s) are due for refill today: CO-Q-10, yes  Requested medication (s) are on the active medication list: yes  Last refill: 03/22/20  Future visit scheduled: yes  Notes to clinic:  no assigned protocol  Requested Prescriptions  Pending Prescriptions Disp Refills   metoCLOPramide (REGLAN) 5 MG tablet [Pharmacy Med Name: METOCLOPRAMIDE HCL 5 MG Tablet] 120 tablet 0    Sig: TAKE 1 TO 2 TABLETS FOUR TIMES DAILY BEFORE MEALS  AND AT BEDTIME      Not Delegated - Gastroenterology: Antiemetics Failed - 06/01/2020 12:36 PM      Failed - This refill cannot be delegated      Passed - Valid encounter within last 6 months    Recent Outpatient Visits           2 weeks ago Sinusitis, unspecified chronicity, unspecified location   [place], [place]   2 months ago Dyslipidemia associated with type 2 diabetes mellitus [facility])   [facility] Steele Sizer, MD   6 months ago Subacute sinusitis, unspecified location   [facility] Steele Sizer, MD   6 months ago Diabetes mellitus with gastroparesis [facility])   [facility] Steele Sizer, MD   7 months ago Simple chronic bronchitis [facility])   [facility] Steele Sizer, MD       Future Appointments             In 1 month Steele Sizer, MD [facility], [place]   In 6 months  [facility], [facility]              Coenzyme Q10 (CO Q-10) 100 MG CAPS [Pharmacy Med Name: CO Q-10 100 MG Capsule] 90 capsule 1    Sig: TAKE 1 CAPSULE EVERY DAY      Off-Protocol Failed - 06/01/2020 12:36 PM      Failed - Medication not assigned to a protocol, review manually.       Passed - Valid encounter within last 12 months    Recent Outpatient Visits           2 weeks ago Sinusitis, unspecified chronicity, unspecified location   [place], [place]   2 months ago Dyslipidemia associated with type 2 diabetes mellitus [facility])   [facility] Steele Sizer, MD   6 months ago Subacute sinusitis, unspecified location   [facility] Steele Sizer, MD   6 months ago Diabetes mellitus with gastroparesis [facility])   [facility] Steele Sizer, MD   7 months ago Simple chronic bronchitis [facility])   [facility] Steele Sizer, MD       Future Appointments             In 1 month Steele Sizer, MD [facility], [place]   In 6 months  [facility], [facility]             Signed Prescriptions Disp Refills   amLODipine (NORVASC) 2.5 MG tablet 90 tablet 0    Sig: TAKE 1 TABLET (2.5 MG TOTAL) BY MOUTH DAILY. STOP 5 MG DOSE      Cardiovascular:  Calcium Channel Blockers Passed -  06/01/2020 12:36 PM      Passed - Last BP in normal range    BP Readings from Last 1 Encounters:  03/10/20 100/60          Passed - Valid encounter within last 6 months    Recent Outpatient Visits           2 weeks ago Sinusitis, unspecified chronicity, unspecified location   [place], MD   2 months ago Dyslipidemia associated with type 2 diabetes mellitus [facility])   [facility] Steele Sizer, MD   6 months ago Subacute sinusitis, unspecified location   [facility] Steele Sizer, MD   6 months ago Diabetes mellitus with gastroparesis [facility])   [facility] Steele Sizer, MD   7 months ago [facility] chronic bronch[facility])   [facility] Steele Sizer, MD       Future Appointments             In 1 month  Steele Sizer, MD [facility], [place]   In 6 months  [facility], [facility]              rosuvastatin (CRESTOR) 20 MG tablet 90 tablet 1    Sig: TAKE 1 TABLET (20 MG TOTAL) BY MOUTH DAILY.      Cardiovascular:  Antilipid - Statins Passed - 06/01/2020 12:36 PM      Passed - Total Cholesterol in normal range and within 360 days    Cholesterol, Total  Date Value Ref Range Status  06/28/2015 143 100 - 199 mg/dL Final   Cholesterol  Date Value Ref Range Status  03/10/2020 126 <200 mg/dL Final          Passed - LDL in normal range and within 360 days    LDL Cholesterol (Calc)  Date Value Ref Range Status  03/10/2020 56 mg/dL (calc) Final    Comment:    Reference range: <100 . Desirable range <100 mg/dL for primary prevention;   <70 mg/dL for patients with CHD or diabetic patients  with > or = 2 CHD risk factors. Marland Kitchen LDL-C is now calculated using the Martin-Hopkins  calculation, which is a validated novel method providing  better accuracy than the Friedewald equation in the  estimation of LDL-C.  Cresenciano Genre et al. Annamaria Helling. 8366;294(76): 2061-2068  (http://education.[employer].com/faq/FAQ164)           Passed - HDL in normal range and within 360 days    HDL  Date Value Ref Range Status  03/10/2020 53 > OR = 50 mg/dL Final  06/28/2015 53 >39 mg/dL Final    Comment:    According to ATP-III Guidelines, HDL-C >59 mg/dL is considered a negative risk factor for CHD.           Passed - Triglycerides in normal range and within 360 days    Triglycerides  Date Value Ref Range Status  03/10/2020 89 <150 mg/dL Final          Passed - Patient is not pregnant      Passed - Valid encounter within last 12 months    Recent Outpatient Visits           2 weeks ago Sinusitis, unspecified chronicity, unspecified location   [facility] [place] Conners D, MD   2 months ago Dyslipidemia associated with type 2 diabetes  mellitus [facility])   [facility] Steele [facility]izer, MD  6 months ago Subacute sinusitis, unspecified location   [facility] Steele Sizer, MD   6 months ago Diabetes mellitus with gastroparesis [facility])   [facility] Steele Sizer, MD   7 months ago Simple chronic bronchitis [facility])   [facility] Steele Sizer, MD       Future Appointments             In 1 month Steele Sizer, MD [facility], [place]   In 6 months  [facility], [facility]              pantoprazole (PROTONIX) 40 MG tablet 90 tablet 1    Sig: TAKE 1 TABLET (40 MG TOTAL) BY MOUTH EVERY MORNING.      Gastroenterology: Proton Pump Inhibitors Passed - 06/01/2020 12:36 PM      Passed - Valid encounter within last 12 months    Recent Outpatient Visits           2 weeks ago Sinusitis, unspecified chronicity, unspecified location   [place], MD   2 months ago Dyslipidemia associated with type 2 diabetes mellitus [facility])   [facility] Steele Sizer, MD   6 months ago Subacute sinusitis, unspecified location   [facility] Steele Sizer, MD   6 months ago Diabetes mellitus with gastroparesis [facility])   [facility] Steele Sizer, MD   7 months ago Simple chronic bronchitis [facility])   [facility] Steele Sizer, MD       Future Appointments             In 1 month Steele Sizer, MD [facility], [place]   In 6 months  [facility], [facility]              isosorbide mononitrate (IMDUR) 60 MG 24 hr tablet 90 tablet 1    Sig: TAKE 1 TABLET (60 MG TOTAL) BY MOUTH DAILY.      Cardiovascular:  Nitrates Passed - 06/01/2020 12:36 PM      Passed - Last BP in normal range    BP Readings from Last 1 Encounters:  03/10/20 100/60          Passed - Last  Heart Rate in normal range    Pulse Readings from Last 1 Encounters:  03/10/20 93          Passed - Valid encounter within last 12 months    Recent Outpatient Visits           2 weeks ago Sinusitis, unspecified chronicity, unspecified location   [place], MD   2 months ago Dyslipidemia associated with type 2 diabetes mellitus [facility])   [facility] Steele Sizer, MD   6 months ago Subacute sinusitis, unspecified location   [facility] Steele Sizer, MD   6 months ago Diabetes mellitus with gastroparesis [facility])   [facility] Steele Sizer, MD   7 months ago Simple chronic bronchitis [facility])   [facility] Steele Sizer, MD       Future Appointments             In 1 month Ancil Boozer, Drue Stager, MD [facility], [place]   In 6 months  [facility], [facility]

## 2020-06-01 NOTE — Telephone Encounter (Signed)
Requested Prescriptions  Pending Prescriptions Disp Refills  . metoCLOPramide (REGLAN) 5 MG tablet [Pharmacy Med Name: METOCLOPRAMIDE HCL 5 MG Tablet] 120 tablet 0    Sig: TAKE 1 TO 2 TABLETS FOUR TIMES DAILY BEFORE MEALS  AND AT BEDTIME     Not Delegated - Gastroenterology: Antiemetics Failed - 06/01/2020 12:36 PM      Failed - This refill cannot be delegated      Passed - Valid encounter within last 6 months    Recent Outpatient Visits          2 weeks ago Sinusitis, unspecified chronicity, unspecified location   [place], MD   2 months ago Dyslipidemia associated with type 2 diabetes mellitus [facility])   [facility] Steele Sizer, MD   6 months ago Subacute sinusitis, unspecified location   [facility], [facility]   6 months ago Diabetes mellitus with gastroparesis [facility])   [facility] Steele Sizer, MD   7 months ago Simple chronic bronchitis [facility])   [facility] Steele Sizer, MD      Future Appointments            In 1 month Steele Sizer, MD [facility], [place]   In 6 months  [facility]           . amLODipine (NORVASC) 2.5 MG tablet [Pharmacy Med Name: AMLODIPINE BESYLATE 2.5 MG Tablet] 90 tablet 0    Sig: TAKE 1 TABLET (2.5 MG TOTAL) BY MOUTH DAILY. STOP 5 MG DOSE     Cardiovascular:  Calcium Channel Blockers Passed - 06/01/2020 12:36 PM      Passed - Last BP in normal range    BP Readings from Last 1 Encounters:  03/10/20 100/60         Passed - Valid encounter within last 6 months    Recent Outpatient Visits          2 weeks ago Sinusitis, unspecified chronicity, unspecified location   [place], MD   2 months ago Dyslipidemia associated with type 2 diabetes mellitus [facility])   [facility] Steele Sizer, MD   6 months  ago Subacute sinusitis, unspecified location   [facility] Steele Sizer, MD   6 months ago Diabetes mellitus with gastroparesis [facility])   [facility] Steele Sizer, MD   7 months ago Simple chronic bronchitis [facility])   [facility] Steele Sizer, MD      Future Appointments            In 1 month Ancil Boozer, Drue Stager, MD [facility], [place]   In 6 months  [facility], [place]           . Coenzyme Q10 (CO Q-10) 100 MG CAPS [Pharmacy Med Name: CO Q-10 100 MG Capsule] 90 capsule 1    Sig: TAKE 1 CAPSULE EVERY DAY     Off-Protocol Failed - 06/01/2020 12:36 PM      Failed - Medication not assigned to a protocol, review manually.      Passed - Valid encounter within last 12 months    Recent Outpatient Visits          2 weeks ago Sinusitis, unspecified chronicity, unspecified location   [facility], [place]   2 months ago Dyslipidemia associated with type 2  diabetes mellitus [facility])   [facility] Steele Sizer, MD   6 months ago Subacute sinusitis, unspecified location   [facility]izer, MD   6 months ago Diabetes mellitus with gastroparesis [facility])   [facility] Steele Sizer, MD   7 months ago Simple chronic bronchitis [facility])   [facility] Steele Sizer, MD      Future Appointments            In 1 month Ancil Boozer, Drue Stager, MD [facility], [place]   In 6 months  [facility], [place]           . rosuvastatin (CRESTOR) 20 MG tablet [Pharmacy Med Name: ROSUVASTATIN CALCIUM 20 MG Tablet] 90 tablet 1    Sig: TAKE 1 TABLET (20 MG TOTAL) BY MOUTH DAILY.     Cardiovascular:  Antilipid - Statins Passed - 06/01/2020 12:36 PM      Passed - Total Cholesterol in normal range and within 360 days    Cholesterol, Total  Date Value Ref Range  Status  06/28/2015 143 100 - 199 mg/dL Final   Cholesterol  Date Value Ref Range Status  03/10/2020 126 <200 mg/dL Final         Passed - LDL in normal range and within 360 days    LDL Cholesterol (Calc)  Date Value Ref Range Status  03/10/2020 56 mg/dL (calc) Final    Comment:    Reference range: <100 . Desirable range <100 mg/dL for primary prevention;   <70 mg/dL for patients with CHD or diabetic patients  with > or = 2 CHD risk factors. Marland Kitchen LDL-C is now calculated using the Martin-Hopkins  calculation, which is a validated novel method providing  better accuracy than the Friedewald equation in the  estimation of LDL-C.  Cresenciano Genre et al. Annamaria Helling. 2423;536(14): 2061-2068  (http://education.[employer].com/faq/FAQ164)          Passed - HDL in normal range and within 360 days    HDL  Date Value Ref Range Status  03/10/2020 53 > OR = 50 mg/dL Final  06/28/2015 53 >39 mg/dL Final    Comment:    According to ATP-III Guidelines, HDL-C >59 mg/dL is considered a negative risk factor for CHD.          Passed - Triglycerides in normal range and within 360 days    Triglycerides  Date Value Ref Range Status  03/10/2020 89 <150 mg/dL Final         Passed - Patient is not pregnant      Passed - Valid encounter within last 12 months    Recent Outpatient Visits          2 weeks ago Sinusitis, unspecified chronicity, unspecified location   [place], MD   2 months ago Dyslipidemia associated with type 2 diabetes mellitus [facility])   [facility] Steele [facility]izer, MD   6 months ago Subacute sinusitis, unspecified location   [facility] Steele Sizer, MD   6 months ago Diabetes mellitus with gastroparesis [facility])   [facility] Steele [facility]izer, MD   7 months ago Simple chronic bronchitis [facility])   [facility] Steele Sizer, MD      Future Appointments             In 1 month Steele Sizer, MD [facility], [place]   In 6 months  [facility], [place]           . pantoprazole (PROTONIX) 40 MG tablet [Pharmacy Med Name: PANTOPRAZOLE SODIUM 40 MG Tablet Delayed Release] 90 tablet 1    Sig: TAKE 1 TABLET (40 MG TOTAL) BY MOUTH EVERY MORNING.     Gastroenterology: Proton Pump Inhibitors Passed - 06/01/2020 12:36 PM      Passed - Valid encounter within last 12 months    Recent Outpatient Visits          2 weeks ago Sinusitis, unspecified chronicity, unspecified location   [place], MD   2 months ago Dyslipidemia associated with type 2 diabetes mellitus [facility])   [facility] Steele [facility]izer, MD   6 months ago Subacute sinusitis, unspecified location   [facility] Steele Sizer, MD   6 months ago Diabetes mellitus with gastroparesis [facility])   [facility] Steele Sizer, MD   7 months ago Simple chronic bronchitis [facility])   [facility] Steele Sizer, MD      Future Appointments            In 1 month Steele Sizer, MD [facility], [place]   In 6 months  [facility]           . isosorbide mononitrate (IMDUR) 60 MG 24 hr tablet [Pharmacy Med Name: ISOSORBIDE MONONITRATE ER 60 MG Tablet Extended Release 24 Hour] 90 tablet 1    Sig: TAKE 1 TABLET (60 MG TOTAL) BY MOUTH DAILY.     Cardiovascular:  Nitrates Passed - 06/01/2020 12:36 PM      Passed - Last BP in normal range    BP Readings from Last 1 Encounters:  03/10/20 100/60         Passed - Last Heart Rate in normal range    Pulse Readings from Last 1 Encounters:  03/10/20 93         Passed - Valid encounter within last 12 months    Recent Outpatient Visits          2 weeks ago Sinusitis, unspecified chronicity, unspecified location   [place] D, MD   2 months ago Dyslipidemia associated with type 2 diabetes mellitus [facility])   [facility] Steele Sizer, MD   6 months ago Subacute sinusitis, unspecified location   [facility] Steele Sizer, MD   6 months ago Diabetes mellitus with gastroparesis [facility])   [facility] Steele Sizer, MD   7 months ago Simple chronic bronchitis [facility])   [facility] Steele Sizer, MD      Future Appointments            In 1 month Ancil Boozer, Drue Stager, MD [facility], [place]   In 6 months  [facility], [facility]

## 2020-06-09 ENCOUNTER — Other Ambulatory Visit: Payer: Self-pay | Admitting: Family Medicine

## 2020-06-09 DIAGNOSIS — E1143 Type 2 diabetes mellitus with diabetic autonomic (poly)neuropathy: Secondary | ICD-10-CM

## 2020-06-10 ENCOUNTER — Telehealth: Payer: Self-pay | Admitting: Family Medicine

## 2020-06-10 NOTE — Telephone Encounter (Signed)
Medication Refill - Medication: ipratropium dromide nasal solution   Has the patient contacted their pharmacy? Yes.   (Agent: If no, request that the patient contact the pharmacy for the refill.) (Agent: If yes, when and what did the pharmacy advise?)  Preferred Pharmacy (with phone number or street name):  [place], [place]  [place] [address]  Phone: [phone] Fax: [phone]  Hours: Not open 24 hours     Agent: Please be advised that RX refills may take up to 3 business days. We ask that you follow-up with your pharmacy.

## 2020-06-10 NOTE — Telephone Encounter (Signed)
Medication Refill - Medication: ipratropium dromide nasal solution   Has the patient contacted their pharmacy? Yes.   (Agent: If no, request that the patient contact the pharmacy for the refill.) (Agent: If yes, when and what did the pharmacy advise?)  Preferred Pharmacy (with phone number or street name):  [place], [place]  [place] [address]  Phone: [phone] Fax: [phone]  Hours: Not open 24 hours     Agent: Please be advised that RX refills may take up to 3 business days. We ask that you follow-up with your pharmacy.    Requested medication (s) are due for refill today: No, Ipatropium dromide nasal spray  Requested medication (s) are on the active medication list:no  Last refill: ?  Future visit scheduled: yes  Notes to clinic: dc'd per Dr Ancil Boozer 07/24/2019

## 2020-06-11 ENCOUNTER — Telehealth: Payer: Self-pay

## 2020-06-11 ENCOUNTER — Other Ambulatory Visit: Payer: Self-pay | Admitting: Family Medicine

## 2020-06-11 MED ORDER — IPRATROPIUM BROMIDE 0.03 % NA SOLN: 2.0000 | Freq: Two times a day (BID) | NASAL | 1 refills | Status: DC

## 2020-06-11 NOTE — Progress Notes (Signed)
06/11/2020 LVM

## 2020-06-11 NOTE — Telephone Encounter (Signed)
Ipratropium Dromide Nasal Solution not on medication list.

## 2020-06-16 ENCOUNTER — Other Ambulatory Visit: Payer: Self-pay

## 2020-06-16 ENCOUNTER — Encounter: Payer: Self-pay | Admitting: Family Medicine

## 2020-06-16 ENCOUNTER — Ambulatory Visit (INDEPENDENT_AMBULATORY_CARE_PROVIDER_SITE_OTHER): Payer: Medicare HMO | Admitting: Family Medicine

## 2020-06-16 VITALS — BP 132/78 | HR 88 | Temp 97.6°F | Resp 18 | Ht 67.0 in | Wt 215.9 lb

## 2020-06-16 DIAGNOSIS — M62838 Other muscle spasm: Secondary | ICD-10-CM

## 2020-06-16 DIAGNOSIS — Z794 Long term (current) use of insulin: Secondary | ICD-10-CM | POA: Diagnosis not present

## 2020-06-16 DIAGNOSIS — Z23 Encounter for immunization: Secondary | ICD-10-CM

## 2020-06-16 DIAGNOSIS — N951 Menopausal and female climacteric states: Secondary | ICD-10-CM

## 2020-06-16 DIAGNOSIS — M542 Cervicalgia: Secondary | ICD-10-CM | POA: Diagnosis not present

## 2020-06-16 DIAGNOSIS — E118 Type 2 diabetes mellitus with unspecified complications: Secondary | ICD-10-CM

## 2020-06-16 DIAGNOSIS — J3089 Other allergic rhinitis: Secondary | ICD-10-CM

## 2020-06-16 MED ORDER — CELECOXIB 200 MG PO CAPS: 200.0000 mg | ORAL_CAPSULE | Freq: Two times a day (BID) | ORAL | 0 refills | Status: DC

## 2020-06-16 MED ORDER — ESTRADIOL 0.5 MG PO TABS: 0.5000 mg | ORAL_TABLET | Freq: Every day | ORAL | 1 refills | Status: DC

## 2020-06-16 MED ORDER — CYCLOBENZAPRINE HCL 5 MG PO TABS: 5.0000 mg | ORAL_TABLET | Freq: Every evening | ORAL | 0 refills | Status: DC | PRN

## 2020-06-16 NOTE — Progress Notes (Signed)
Name: Dana Sparks   MRN: [id number]    DOB: 1951/03/14   Date:06/16/2020       Progress Note  Subjective  Chief Complaint  Follow up   HPI    Perennial Allergic Rhinitis: she states nasal steroids causes her nose to bleed, she likes the Atrovent best, she uses every morning and Flonase only at night and no recent nose bleeds. She states currently the rhinorrhea and nasal congestion has been controlled with medications. She thought she needed a refill of Atrovent, I sent it to [facility] on 06/11/2020   Neck pain: she has a history of back pain intermittently, this episodes started a few weeks ago, Dr. Roxan Hockey saw her for bronchitis and gave her cyclobenzaprine. She states medication controlled symptoms but was only given 10 pills and symptoms are back again. Pain is described as sharp, intense at time , no radiation - only on left side of posterior and lateral neck. No symptoms or radiculitis. She was having some cramps on hands and feet but that has improved. She had chiropractor adjustments at one time that was very helpful - but it is too costly.  Hot Flashes: she is on Effexor and clonidine, a few years ago her symptoms were severe when she stopped Effexor but she states currently taking all her medications. We will try low dose Premarin , discussed possible side effects , she is status post-hysterectomy.    Patient Active Problem List   Diagnosis Date Noted   Chronic bronchitis ([place]) 02/22/2020   Disease of stomach    Hx of colonic polyps    Hepatomegaly 07/23/2018   Fatty liver 07/23/2018   Aortic atherosclerosis ([place]) 07/09/2018   Diabetes mellitus type 2, controlled, with complications ([place]) 28/36/6294   Nasal polyp 09/03/2017   Lipoma of back 09/03/2017   Claudication ([place]) 06/04/2017   LVH (left ventricular hypertrophy) 02/27/2017   Decreased cardiac ejection fraction 02/27/2017   Left hand weakness 10/12/2016   Elevated antinuclear antibody (ANA)  level 04/27/2016   Angina pectoris ([place]) 11/01/2015   Well controlled type 2 diabetes mellitus with gastroparesis ([place]) 06/22/2015   Low TSH level 06/22/2015   Iron deficiency anemia 04/07/2015   Intestinal metaplasia of gastric mucosa 03/11/2015   CAD (coronary artery disease), native coronary artery 02/24/2015   History of coronary artery stent placement 02/24/2015   Chronic kidney disease, stage 3, mod decreased GFR ([place]) 02/24/2015   Menopause 76/54/6503   History of Helicobacter pylori infection 02/24/2015   Mild mitral insufficiency 02/24/2015   Mild tricuspid insufficiency 02/24/2015   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 ([place]) 02/24/2015   Controlled gout 02/24/2015   Depression, major, recurrent, mild ([place]) 02/24/2015   Mild pulmonary hypertension ([place]) 02/24/2015   Gastroesophageal reflux disease without esophagitis 02/24/2015   Perennial allergic rhinitis 02/24/2015   HLD (hyperlipidemia) 02/20/2015   Hypertension, benign 02/20/2015   Apnea, sleep 02/20/2015   Controlled diabetes mellitus with stage 3 chronic kidney disease, without long-term current use of insulin ([place]) 02/20/2015    Past Surgical History:  Procedure Laterality Date   ABDOMINAL HYSTERECTOMY     total   CATARACT EXTRACTION     CATARACT EXTRACTION W/PHACO Left 03/24/2019   Procedure: CATARACT EXTRACTION PHACO AND INTRAOCULAR LENS PLACEMENT ([place])  LEFT DIABETIC;  Surgeon: Eulogio Bear, MD;  Location: [facility];  Service: Ophthalmology;  Laterality: Left;  Diabetic - insulin and oral meds sleep apnea   COLONOSCOPY  01/2014   sigmoid diverticulosis.  desc colon TA, hyperplastic polyp   COLONOSCOPY WITH PROPOFOL N/A 09/02/2018   Procedure: COLONOSCOPY WITH PROPOFOL;  Surgeon: Lucilla Lame, MD;  Location: [facility];  Service: Endoscopy;  Laterality: N/A;   CORONARY ANGIOPLASTY WITH STENT PLACEMENT     ESOPHAGOGASTRODUODENOSCOPY  N/A 02/16/2015   negative h pylori, focal gastic intestinal metaplasia   ESOPHAGOGASTRODUODENOSCOPY (EGD) WITH PROPOFOL N/A 09/02/2018   Procedure: ESOPHAGOGASTRODUODENOSCOPY (EGD) WITH BIOPSIES;  Surgeon: Lucilla Lame, MD;  Location: [facility];  Service: Endoscopy;  Laterality: N/A;  diabetic -insulin and oral meds sleep apnea   TOTAL KNEE ARTHROPLASTY Right     Family History  Problem Relation Age of Onset   Stroke Sister    Hyperlipidemia Sister    Alcohol abuse Brother    Diabetes Brother    Stroke Brother    Hypertension Mother    Diabetes Mother    Heart disease Mother    Diabetes Father    Heart disease Father    Hypertension Father    Hypertension Sister    Cancer Maternal Grandmother        Unsure   Diabetes Maternal Grandfather    Colon cancer Neg Hx    Liver disease Neg Hx    Breast cancer Neg Hx     Social History   Tobacco Use   Smoking status: Former Smoker    Packs/day: 1.00    Years: 40.00    Pack years: 40.00    Types: Cigarettes    Start date: 09/04/1972    Quit date: 12/30/2012    Years since quitting: 7.4   Smokeless tobacco: Never Used   Tobacco comment: smoking cessation materials not required  Substance Use Topics   Alcohol use: No    Alcohol/week: 0.0 standard drinks     Current Outpatient Medications:    ACCU-CHEK FASTCLIX LANCETS MISC, , Disp: , Rfl:    ACCU-CHEK SMARTVIEW test strip, Check fsbs two times daily  DMII, Disp: 100 each, Rfl: 6   Alcohol Swabs (B-D SINGLE USE SWABS REGULAR) PADS, 1 each by Does not apply route 4 (four) times daily., Disp: 200 each, Rfl: 2   allopurinol (ZYLOPRIM) 100 MG tablet, TAKE 1 TABLET (100 MG TOTAL) BY MOUTH DAILY., Disp: 90 tablet, Rfl: 1   amLODipine (NORVASC) 2.5 MG tablet, TAKE 1 TABLET (2.5 MG TOTAL) BY MOUTH DAILY. STOP 5 MG DOSE, Disp: 90 tablet, Rfl: 0   aspirin EC 81 MG tablet, Take 81 mg by mouth every morning. , Disp: , Rfl:    BD INSULIN SYRINGE U/F  31G X 5/16" 0.3 ML MISC, , Disp: , Rfl:    blood glucose meter kit and supplies KIT, Dispense based on patient and insurance preference. Use up to four times daily as directed. (FOR ICD-9 250.00, 250.01)., Disp: 1 each, Rfl: 0   Cholecalciferol (VITAMIN D-3 PO), Take 1,000 Units by mouth daily. , Disp: , Rfl:    CINNAMON PO, Take 1,000 mg by mouth daily., Disp: , Rfl:    cloNIDine (CATAPRES) 0.1 MG tablet, Take 1 tablet (0.1 mg total) by mouth every evening., Disp: 90 tablet, Rfl: 1   Coenzyme Q10 (CO Q-10) 100 MG CAPS, TAKE 1 CAPSULE EVERY DAY, Disp: 90 capsule, Rfl: 1   cyclobenzaprine (FLEXERIL) 5 MG tablet, Take 1 tablet (5 mg total) by mouth at bedtime as needed for muscle spasms. May increase to two tabs at bedtime pending response, Disp: 30 tablet, Rfl: 0   DENTA 5000 PLUS 1.1 % CREA dental  cream, , Disp: , Rfl:    Dulaglutide (TRULICITY [place]), Inject into the skin. Inject SQ once weekly, Disp: , Rfl:    Ferrous Sulfate (IRON) 325 (65 Fe) MG TABS, Take 325 mg by mouth daily. Nature made brand-, Disp: , Rfl:    fluticasone (FLONASE) 50 MCG/ACT nasal spray, PLACE 2 SPRAYS INTO BOTH NOSTRILS DAILY., Disp: 48 g, Rfl: 2   Insulin Glargine (BASAGLAR KWIKPEN) 100 UNIT/ML SOPN, Inject 60 Units into the skin at bedtime., Disp: , Rfl:    Insulin Pen Needle (FIFTY50 PEN NEEDLES) 31G X 5 MM MISC, Use once daily, Disp: , Rfl:    ipratropium (ATROVENT) 0.03 % nasal spray, Place 2 sprays into both nostrils every 12 (twelve) hours., Disp: 90 mL, Rfl: 1   isosorbide mononitrate (IMDUR) 60 MG 24 hr tablet, TAKE 1 TABLET (60 MG TOTAL) BY MOUTH DAILY., Disp: 90 tablet, Rfl: 1   Lancets 28G MISC, Use 1 each 2 (two) times daily accu chek. Diagnosis: E11.22, Disp: , Rfl:    lisinopril (ZESTRIL) 20 MG tablet, TAKE 1 TABLET (20 MG TOTAL) BY MOUTH DAILY., Disp: 90 tablet, Rfl: 1   loratadine (CLARITIN) 10 MG tablet, TAKE 1 TABLET EVERY DAY, Disp: 90 tablet, Rfl: 1   metoCLOPramide (REGLAN) 5 MG  tablet, TAKE 1 TO 2 TABLETS FOUR TIMES DAILY BEFORE MEALS  AND AT BEDTIME, Disp: 120 tablet, Rfl: 0   metoprolol succinate (TOPROL-XL) 50 MG 24 hr tablet, Take 1 tablet (50 mg total) by mouth daily., Disp: 90 tablet, Rfl: 3   montelukast (SINGULAIR) 10 MG tablet, Take 1 tablet (10 mg total) by mouth every evening., Disp: 90 tablet, Rfl: 1   pantoprazole (PROTONIX) 40 MG tablet, TAKE 1 TABLET (40 MG TOTAL) BY MOUTH EVERY MORNING., Disp: 90 tablet, Rfl: 1   rosuvastatin (CRESTOR) 20 MG tablet, TAKE 1 TABLET (20 MG TOTAL) BY MOUTH DAILY., Disp: 90 tablet, Rfl: 1   SYNJARDY XR 12.01-999 MG TB24, Take 2 tablets by mouth daily., Disp: , Rfl:    venlafaxine XR (EFFEXOR-XR) 75 MG 24 hr capsule, TAKE 1 CAPSULE EVERY DAY, Disp: 90 capsule, Rfl: 0   WIXELA INHUB 250-50 MCG/DOSE AEPB, INHALE 1 PUFF INTO THE LUNGS 2 (TWO) TIMES DAILY., Disp: 180 each, Rfl: 1   celecoxib (CELEBREX) 200 MG capsule, Take 1 capsule (200 mg total) by mouth 2 (two) times daily., Disp: 30 capsule, Rfl: 0   estradiol (ESTRACE) 0.5 MG tablet, Take 1 tablet (0.5 mg total) by mouth daily., Disp: 90 tablet, Rfl: 1   SODIUM FLUORIDE 5000 PPM 1.1 % PSTE, , Disp: , Rfl:   Allergies  Allergen Reactions   Codeine Other (See Comments)    Unknown reaction   Contrast Media [Iodinated Diagnostic Agents] Itching   Sulfa Antibiotics Itching    I personally reviewed active problem list, medication list, allergies, family history, social history, health maintenance with the patient/caregiver today.   ROS  Ten systems reviewed and is negative except as mentioned in HPI   Objective  Vitals:   06/16/20 1137  BP: 132/78  Pulse: 88  Resp: 18  Temp: 97.6 F (36.4 C)  TempSrc: Oral  SpO2: 98%  Weight: 215 lb 14.4 oz (97.9 kg)    Body mass index is 35.38 kg/m.  Physical Exam  Constitutional: Patient appears well-developed and well-nourished. Obese  No distress.  HEENT: head atraumatic, normocephalic, pupils equal and  reactive to light, ears normal TM,  neck is tender to touch at C6-C7 level, pain with rom  of neck, down on trapezium muscle mid spine and also towards shoulder  Cardiovascular: Normal rate, regular rhythm and normal heart sounds.  No murmur heard. No BLE edema. Pulmonary/Chest: Effort normal and breath sounds normal. No respiratory distress. Abdominal: Soft.  There is no tenderness. Psychiatric: Patient has a normal mood and affect. behavior is normal. Judgment and thought content normal.  PHQ2/9: Depression screen [facility] 2/9 06/16/2020 05/17/2020 03/10/2020 12/11/2019 11/28/2019  Decreased Interest 0 0 0 0 0  Down, Depressed, Hopeless 0 0 0 0 0  PHQ - 2 Score 0 0 0 0 0  Altered sleeping - - 0 - 0  Tired, decreased energy - - 0 - 0  Change in appetite - - 0 - 0  Feeling bad or failure about yourself  - - 0 - 0  Trouble concentrating - - 0 - 0  Moving slowly or fidgety/restless - - 0 - 0  Suicidal thoughts - - 0 - 0  PHQ-9 Score - - 0 - 0  Difficult doing work/chores - - - - -  Some recent data might be hidden    phq 9 is negative   Fall Risk: Fall Risk  06/16/2020 05/17/2020 03/10/2020 12/11/2019 11/28/2019  Falls in the past year? 0 0 0 0 0  Number falls in past yr: 0 0 0 0 0  Injury with Fall? 0 0 0 0 0  Comment - - - - -  Risk for fall due to : - - - No Fall Risks -  Follow up - Falls evaluation completed - Falls prevention discussed -    Functional Status Survey: Is the patient deaf or have difficulty hearing?: No Does the patient have difficulty seeing, even when wearing glasses/contacts?: No Does the patient have difficulty concentrating, remembering, or making decisions?: No Does the patient have difficulty walking or climbing stairs?: No Does the patient have difficulty dressing or bathing?: No Does the patient have difficulty doing errands alone such as visiting a doctor's office or shopping?: No    Assessment & Plan  1. Controlled type 2 diabetes mellitus with complication,  with long-term current use of insulin (HCC)  - POCT HgB A1C  2. Need for immunization against influenza  - Flu Vaccine QUAD High Dose(Fluad)  3. Perennial allergic rhinitis   4. Hot flashes due to menopause  - estradiol (ESTRACE) 0.5 MG tablet; Take 1 tablet (0.5 mg total) by mouth daily.  Dispense: 90 tablet; Refill: 1  5. Neck pain on left side  - celecoxib (CELEBREX) 200 MG capsule; Take 1 capsule (200 mg total) by mouth 2 (two) times daily.  Dispense: 30 capsule; Refill: 0 - cyclobenzaprine (FLEXERIL) 5 MG tablet; Take 1 tablet (5 mg total) by mouth at bedtime as needed for muscle spasms. May increase to two tabs at bedtime pending response  Dispense: 30 tablet; Refill: 0  6. Neck muscle spasm  - celecoxib (CELEBREX) 200 MG capsule; Take 1 capsule (200 mg total) by mouth 2 (two) times daily.  Dispense: 30 capsule; Refill: 0 - cyclobenzaprine (FLEXERIL) 5 MG tablet; Take 1 tablet (5 mg total) by mouth at bedtime as needed for muscle spasms. May increase to two tabs at bedtime pending response  Dispense: 30 tablet; Refill: 0  7. Trapezius muscle spasm  - celecoxib (CELEBREX) 200 MG capsule; Take 1 capsule (200 mg total) by mouth 2 (two) times daily.  Dispense: 30 capsule; Refill: 0 - cyclobenzaprine (FLEXERIL) 5 MG tablet; Take 1 tablet (5 mg total) by mouth  at bedtime as needed for muscle spasms. May increase to two tabs at bedtime pending response  Dispense: 30 tablet; Refill: 0

## 2020-06-17 DIAGNOSIS — G4733 Obstructive sleep apnea (adult) (pediatric): Secondary | ICD-10-CM | POA: Diagnosis not present

## 2020-06-23 ENCOUNTER — Telehealth: Payer: Self-pay | Admitting: Pharmacist

## 2020-06-23 ENCOUNTER — Other Ambulatory Visit: Payer: Self-pay

## 2020-06-23 ENCOUNTER — Telehealth: Payer: Self-pay

## 2020-06-23 DIAGNOSIS — M62838 Other muscle spasm: Secondary | ICD-10-CM

## 2020-06-23 DIAGNOSIS — M542 Cervicalgia: Secondary | ICD-10-CM

## 2020-06-23 MED ORDER — CELECOXIB 200 MG PO CAPS: 200.0000 mg | ORAL_CAPSULE | Freq: Two times a day (BID) | ORAL | 0 refills | Status: DC

## 2020-06-23 NOTE — Progress Notes (Signed)
.. °  Recent Relevant Labs: Lab Results  Component Value Date/Time   HGBA1C 6.8 02/10/2020 09:46 AM   HGBA1C 8.2 11/05/2019 12:00 AM   MICROALBUR 15.9 11/05/2019 12:00 AM   MICROALBUR 0.8 03/17/2019 08:54 AM   MICROALBUR 50 03/13/2016 09:38 AM   MICROALBUR 20 06/28/2015 09:10 AM    Kidney Function Lab Results  Component Value Date/Time   CREATININE 0.88 03/17/2019 08:54 AM   CREATININE 0.71 08/16/2018 08:19 AM   CREATININE 0.68 07/15/2018 12:28 PM   CREATININE 0.84 06/04/2018 09:14 AM   GFRNONAA 68 03/17/2019 08:54 AM   GFRAA 79 03/17/2019 08:54 AM     Current antihyperglycemic regimen:  o Trulicity, Basaglar, Synjardy  What recent interventions/DTPs have been made to improve glycemic control:  o Continued weight loss,   Have there been any recent hospitalizations or ED visits since last visit with CPP? No  Patient denies hypoglycemic symptoms, including None  Patient denies hyperglycemic symptoms, including none  How often are you checking your blood sugar? once daily  What are your blood sugars ranging?  o Fasting: 74 o Before meals: 129 o After meals: 132 o Bedtime: 211(missed injection)  During the week, how often does your blood glucose drop below 70? Never  Are you checking your feet daily/regularly?  daily  Adherence Review: Is the patient currently on a STATIN medication? Yes Is the patient currently on ACE/ARB medication? Yes Does the patient have >5 day gap between last estimated fill dates? Yes  Patient also asked if there was anything more that she can do for hot flashes, suggested follow up with Dr. Ancil Boozer.  Neck pain is better with medication. Patient is not stretching or performing any type of home PT. Will discuss with Clare Gandy to see if there is a handout with stretching that can be provided.   Pt complains of greater toe being itchy. Denies redness, tenderness, skin changes, nail changes, or trauma. Has podiatry appointment on 11/3. Suggested  moisturizing due to weather change until then. If any changes to toe prior to appointment pt to seek immediate medical attention. Patient voiced understanding with plan.

## 2020-06-23 NOTE — Chronic Care Management (AMB) (Signed)
PAP for Trulicity done at Endo. DM well controlled currently.

## 2020-06-23 NOTE — Telephone Encounter (Signed)
Copied from [place] [id number]. Topic: General - Call Back - No Documentation >> Jun 23, 2020 10:11 AM Hinda Lenis D wrote: PT retuning the call

## 2020-06-26 ENCOUNTER — Other Ambulatory Visit: Payer: Self-pay | Admitting: Family Medicine

## 2020-06-26 DIAGNOSIS — E1143 Type 2 diabetes mellitus with diabetic autonomic (poly)neuropathy: Secondary | ICD-10-CM

## 2020-06-26 NOTE — Telephone Encounter (Signed)
Requested medication (s) are due for refill today: yes  Requested medication (s) are on the active medication list: yes  Last refill:  06/10/20  Future visit scheduled: yes  Notes to clinic:  med not delegated to NT to RF   Requested Prescriptions  Pending Prescriptions Disp Refills   metoCLOPramide (REGLAN) 5 MG tablet [Pharmacy Med Name: METOCLOPRAMIDE HCL 5 MG Tablet] 120 tablet 0    Sig: TAKE 1 TO 2 TABLETS FOUR TIMES DAILY BEFORE MEALS  AND AT BEDTIME      Not Delegated - Gastroenterology: Antiemetics Failed - 06/26/2020  3:36 PM      Failed - This refill cannot be delegated      Passed - Valid encounter within last 6 months    Recent Outpatient Visits           1 week ago Neck pain on left side   [facility] Steele Sizer, MD   1 month ago Sinusitis, unspecified chronicity, unspecified location   [place], [place]   3 months ago Dyslipidemia associated with type 2 diabetes mellitus [facility])   [facility] Steele Sizer, MD   7 months ago Subacute sinusitis, unspecified location   [facility] Steele Sizer, MD   7 months ago Diabetes mellitus with gastroparesis [facility])   [facility] Steele Sizer, MD       Future Appointments             In 2 weeks Ancil Boozer, Drue Stager, MD [facility], [place]   In 5 months  [facility], [facility]

## 2020-06-28 DIAGNOSIS — E1142 Type 2 diabetes mellitus with diabetic polyneuropathy: Secondary | ICD-10-CM | POA: Diagnosis not present

## 2020-06-28 DIAGNOSIS — I10 Essential (primary) hypertension: Secondary | ICD-10-CM | POA: Diagnosis not present

## 2020-06-28 DIAGNOSIS — Z794 Long term (current) use of insulin: Secondary | ICD-10-CM | POA: Diagnosis not present

## 2020-06-28 DIAGNOSIS — E1159 Type 2 diabetes mellitus with other circulatory complications: Secondary | ICD-10-CM | POA: Diagnosis not present

## 2020-06-28 DIAGNOSIS — Z03818 Encounter for observation for suspected exposure to other biological agents ruled out: Secondary | ICD-10-CM | POA: Diagnosis not present

## 2020-06-28 DIAGNOSIS — R0981 Nasal congestion: Secondary | ICD-10-CM | POA: Diagnosis not present

## 2020-06-28 LAB — HEMOGLOBIN A1C: Hemoglobin A1C: 6.3

## 2020-06-29 ENCOUNTER — Other Ambulatory Visit: Payer: Self-pay

## 2020-06-29 ENCOUNTER — Telehealth: Payer: Self-pay | Admitting: Family Medicine

## 2020-06-29 DIAGNOSIS — M62838 Other muscle spasm: Secondary | ICD-10-CM

## 2020-06-29 DIAGNOSIS — M542 Cervicalgia: Secondary | ICD-10-CM

## 2020-06-29 NOTE — Telephone Encounter (Signed)
Pt insurance will no longer pay for generic advair and pt would like to go back to [facility] through the medical assistant program attn Sarita phone number is [phone]

## 2020-06-30 ENCOUNTER — Ambulatory Visit (INDEPENDENT_AMBULATORY_CARE_PROVIDER_SITE_OTHER): Payer: Medicare HMO | Admitting: Internal Medicine

## 2020-06-30 ENCOUNTER — Other Ambulatory Visit: Payer: Self-pay

## 2020-06-30 ENCOUNTER — Encounter: Payer: Self-pay | Admitting: Internal Medicine

## 2020-06-30 DIAGNOSIS — J329 Chronic sinusitis, unspecified: Secondary | ICD-10-CM

## 2020-06-30 DIAGNOSIS — J069 Acute upper respiratory infection, unspecified: Secondary | ICD-10-CM

## 2020-06-30 NOTE — Progress Notes (Signed)
Name: Dana Sparks   MRN: [id number]    DOB: 05/06/1951   Date:06/30/2020       Progress Note  Subjective  Chief Complaint  Chief Complaint  Patient presents with  . Sinusitis  . Wheezing    I connected with  Dana Sparks on 06/30/20 at  1:20 PM EDT by telephone and verified that I am speaking with the correct person using two identifiers.  I discussed the limitations, risks, security and privacy concerns of performing an evaluation and management service by telephone and the availability of in person appointments. The patient expressed understanding and agreed to proceed. Staff also discussed with the patient that there may be a patient responsible charge related to this service. Patient Location: Home Provider Location: [facility] Additional Individuals present: none  HPI  Patient is a 69 year old female patient of Dr. Ancil Boozer Last visit with her was 06/16/2020 Follows up today by phone visit with sinus concerns. Treated with Augmentin in September for sinusitis concern, this time not as bad Had Covid test Monday and was negative Symptoms started three days ago,  + cough, minimal production of light yellowish mucus No marked SOB No fever, feeling feverish No sore throat.  + congestion in sinuses, and no pain pressing on sinuses No loss of smell, loss of taste No N/V No muscle aches No marked loose stools/diarrhea No CP,  passing out episodes   Taking cough med - tussinex, psuedophed but does not like to use due to her blood pressure concern, uses flonase at night, Atrovent in am nasal, takes singulair at night and claritin Former smoker, quit 2014 Comorbid conditions reviewed No asthma hx,  + h/o DM, heart disease, CKD, obesity Wt Readings from Last 3 Encounters:  06/16/20 215 lb 14.4 oz (97.9 kg)  04/19/20 209 lb (94.8 kg)  03/10/20 213 lb 3.2 oz (96.7 kg)      Patient Active Problem List   Diagnosis Date Noted  . Chronic bronchitis ([place]) 02/22/2020  . Disease of  stomach   . Hx of colonic polyps   . Hepatomegaly 07/23/2018  . Fatty liver 07/23/2018  . Aortic atherosclerosis ([place]) 07/09/2018  . Diabetes mellitus type 2, controlled, with complications ([place]) 68/08/7516  . Nasal polyp 09/03/2017  . Lipoma of back 09/03/2017  . Claudication ([place]) 06/04/2017  . LVH (left ventricular hypertrophy) 02/27/2017  . Decreased cardiac ejection fraction 02/27/2017  . Left hand weakness 10/12/2016  . Elevated antinuclear antibody (ANA) level 04/27/2016  . Angina pectoris ([place]) 11/01/2015  . Well controlled type 2 diabetes mellitus with gastroparesis ([place]) 06/22/2015  . Low TSH level 06/22/2015  . Iron deficiency anemia 04/07/2015  . Intestinal metaplasia of gastric mucosa 03/11/2015  . CAD (coronary artery disease), native coronary artery 02/24/2015  . History of coronary artery stent placement 02/24/2015  . Chronic kidney disease, stage 3, mod decreased GFR ([facility]) 02/24/2015  . Menopause 02/24/2015  . History of Helicobacter pylori infection 02/24/2015  . Mild mitral insufficiency 02/24/2015  . Mild tricuspid insufficiency 02/24/2015  . 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 Controlled gout 02/24/2015  . Depression, major, recurrent, mild ([place]) 02/24/2015  . Mild pulmonary hypertension ([place]) 02/24/2015  . Gastroesophageal reflux disease without esophagitis 02/24/2015  . Perennial allergic rhinitis 02/24/2015  . HLD (hyperlipidemia) 02/20/2015  . Hypertension, benign 02/20/2015  . Apnea, sleep 02/20/2015  . Controlled diabetes mellitus with stage 3 chronic kidney disease, without long-term current use of insulin ([place]) 02/20/2015  Past Surgical History:  Procedure Laterality Date  . ABDOMINAL HYSTERECTOMY     total  . CATARACT EXTRACTION    . CATARACT EXTRACTION W/PHACO Left 03/24/2019   Procedure: CATARACT EXTRACTION PHACO AND INTRAOCULAR LENS PLACEMENT ([place])  LEFT DIABETIC;  Surgeon: Eulogio Bear, MD;  Location: [employer];  Service: Ophthalmology;  Laterality: Left;  Diabetic - insulin and oral meds sleep apnea  . COLONOSCOPY  01/2014   sigmoid diverticulosis. desc colon TA, hyperplastic polyp  . COLONOSCOPY WITH PROPOFOL N/A 09/02/2018   Procedure: COLONOSCOPY WITH PROPOFOL;  Surgeon: Lucilla Lame, MD;  Location: [place];  Service: Endoscopy;  Laterality: N/A;  . CORONARY ANGIOPLASTY WITH STENT PLACEMENT    . ESOPHAGOGASTRODUODENOSCOPY N/A 02/16/2015   negative h pylori, focal gastic intestinal metaplasia  . ESOPHAGOGASTRODUODENOSCOPY (EGD) WITH PROPOFOL N/A 09/02/2018   Procedure: ESOPHAGOGASTRODUODENOSCOPY (EGD) WITH BIOPSIES;  Surgeon: Lucilla Lame, MD;  Location: [place];  Service: Endoscopy;  Laterality: N/A;  diabetic -insulin and oral meds sleep apnea  . TOTAL KNEE ARTHROPLASTY Right     Family History  Problem Relation Age of Onset  . Stroke Sister   . Hyperlipidemia Sister   . Alcohol abuse Brother   . Diabetes Brother   . Stroke Brother   . Hypertension Mother   . Diabetes Mother   . Heart disease Mother   . Diabetes Father   . Heart disease Father   . Hypertension Father   . Hypertension Sister   . Cancer Maternal Grandmother        Unsure  . Diabetes Maternal Grandfather   . Colon cancer Neg Hx   . Liver disease Neg Hx   . Breast cancer Neg Hx     Social History   Tobacco Use  . Smoking status: Former Smoker    Packs/day: 1.00    Years: 40.00    Pack years: 40.00    Types: Cigarettes    Start date: 09/04/1972    Quit date: 12/30/2012    Years since quitting: 7.5  . Smokeless tobacco: Never Used  . Tobacco comment: smoking cessation materials not required  Substance Use Topics  . Alcohol use: No    Alcohol/week: 0.0 standard drinks     Current Outpatient Medications:  .  ACCU-CHEK FASTCLIX LANCETS MISC, , Disp: , Rfl:  .  ACCU-CHEK SMARTVIEW test strip, Check fsbs two times daily  DMII, Disp: 100 each,  Rfl: 6 .  Alcohol Swabs (B-D SINGLE USE SWABS REGULAR) PADS, 1 each by Does not apply route 4 (four) times daily., Disp: 200 each, Rfl: 2 .  allopurinol (ZYLOPRIM) 100 MG tablet, TAKE 1 TABLET (100 MG TOTAL) BY MOUTH DAILY., Disp: 90 tablet, Rfl: 1 .  amLODipine (NORVASC) 2.5 MG tablet, TAKE 1 TABLET (2.5 MG TOTAL) BY MOUTH DAILY. STOP 5 MG DOSE, Disp: 90 tablet, Rfl: 0 .  aspirin EC 81 MG tablet, Take 81 mg by mouth every morning. , Disp: , Rfl:  .  BD INSULIN SYRINGE U/F 31G X 5/16" 0.3 ML MISC, , Disp: , Rfl:  .  blood glucose meter kit and supplies KIT, Dispense based on patient and insurance preference. Use up to four times daily as directed. (FOR ICD-9 250.00, 250.01)., Disp: 1 each, Rfl: 0 .  celecoxib (CELEBREX) 200 MG capsule, , Disp: , Rfl:  .  Cholecalciferol (VITAMIN D-3 PO), Take 1,000 Units by mouth daily. , Disp: , Rfl:  .  CINNAMON PO, Take 1,000 mg by mouth daily.,  Disp: , Rfl:  .  cloNIDine (CATAPRES) 0.1 MG tablet, Take 1 tablet (0.1 mg total) by mouth every evening., Disp: 90 tablet, Rfl: 1 .  Coenzyme Q10 (CO Q-10) 100 MG CAPS, TAKE 1 CAPSULE EVERY DAY, Disp: 90 capsule, Rfl: 1 .  cyclobenzaprine (FLEXERIL) 5 MG tablet, Take 1 tablet (5 mg total) by mouth at bedtime as needed for muscle spasms. May increase to two tabs at bedtime pending response, Disp: 30 tablet, Rfl: 0 .  DENTA 5000 PLUS 1.1 % CREA dental cream, , Disp: , Rfl:  .  Dulaglutide (TRULICITY [place]), Inject into the skin. Inject SQ once weekly, Disp: , Rfl:  .  estradiol (ESTRACE) 0.5 MG tablet, Take 1 tablet (0.5 mg total) by mouth daily., Disp: 90 tablet, Rfl: 1 .  Ferrous Sulfate (IRON) 325 (65 Fe) MG TABS, Take 325 mg by mouth daily. Nature made brand-, Disp: , Rfl:  .  fluticasone (FLONASE) 50 MCG/ACT nasal spray, PLACE 2 SPRAYS INTO BOTH NOSTRILS DAILY., Disp: 48 g, Rfl: 2 .  Insulin Glargine (BASAGLAR KWIKPEN) 100 UNIT/ML SOPN, Inject 60 Units into the skin at bedtime., Disp: , Rfl:  .  Insulin Pen Needle  (FIFTY50 PEN NEEDLES) 31G X 5 MM MISC, Use once daily, Disp: , Rfl:  .  ipratropium (ATROVENT) 0.03 % nasal spray, Place 2 sprays into both nostrils every 12 (twelve) hours., Disp: 90 mL, Rfl: 1 .  isosorbide mononitrate (IMDUR) 60 MG 24 hr tablet, TAKE 1 TABLET (60 MG TOTAL) BY MOUTH DAILY., Disp: 90 tablet, Rfl: 1 .  Lancets 28G MISC, Use 1 each 2 (two) times daily accu chek. Diagnosis: E11.22, Disp: , Rfl:  .  lisinopril (ZESTRIL) 20 MG tablet, TAKE 1 TABLET (20 MG TOTAL) BY MOUTH DAILY., Disp: 90 tablet, Rfl: 1 .  loratadine (CLARITIN) 10 MG tablet, TAKE 1 TABLET EVERY DAY, Disp: 90 tablet, Rfl: 1 .  metoCLOPramide (REGLAN) 5 MG tablet, TAKE 1 TO 2 TABLETS FOUR TIMES DAILY BEFORE MEALS  AND AT BEDTIME, Disp: 120 tablet, Rfl: 0 .  metoprolol succinate (TOPROL-XL) 50 MG 24 hr tablet, Take 1 tablet (50 mg total) by mouth daily., Disp: 90 tablet, Rfl: 3 .  montelukast (SINGULAIR) 10 MG tablet, Take 1 tablet (10 mg total) by mouth every evening., Disp: 90 tablet, Rfl: 1 .  pantoprazole (PROTONIX) 40 MG tablet, TAKE 1 TABLET (40 MG TOTAL) BY MOUTH EVERY MORNING., Disp: 90 tablet, Rfl: 1 .  rosuvastatin (CRESTOR) 20 MG tablet, TAKE 1 TABLET (20 MG TOTAL) BY MOUTH DAILY., Disp: 90 tablet, Rfl: 1 .  SODIUM FLUORIDE 5000 PPM 1.1 % PSTE, , Disp: , Rfl:  .  SYNJARDY XR 12.01-999 MG TB24, Take 2 tablets by mouth daily., Disp: , Rfl:  .  venlafaxine XR (EFFEXOR-XR) 75 MG 24 hr capsule, TAKE 1 CAPSULE EVERY DAY, Disp: 90 capsule, Rfl: 0 .  WIXELA INHUB 250-50 MCG/DOSE AEPB, INHALE 1 PUFF INTO THE LUNGS 2 (TWO) TIMES DAILY., Disp: 180 each, Rfl: 1  Allergies  Allergen Reactions  . Codeine Other (See Comments)    Unknown reaction  . Contrast Media [Iodinated Diagnostic Agents] Itching  . Sulfa Antibiotics Itching    With staff assistance, above reviewed with the patient today.  ROS: As per HPI, otherwise no specific complaints on a limited and focused system review   Objective  Virtual encounter,  vitals not obtained.  There is no height or weight on file to calculate BMI.  Physical Exam   Appears in NAD via  conversation, has a nasal voice, no cough during our conversation Breathing: No obvious respiratory distress. Speaking in complete sentences Neurological: Pt is alert, Speech is normal Psychiatric: Patient has a normal mood and affect, Judgment and thought content normal.   No results found for this or any previous visit (from the past 72 hour(s)).  PHQ2/9: Depression screen [facility] 2/9 06/30/2020 06/16/2020 05/17/2020 03/10/2020 12/11/2019  Decreased Interest 0 0 0 0 0  Down, Depressed, Hopeless 0 0 0 0 0  PHQ - 2 Score 0 0 0 0 0  Altered sleeping - - - 0 -  Tired, decreased energy - - - 0 -  Change in appetite - - - 0 -  Feeling bad or failure about yourself  - - - 0 -  Trouble concentrating - - - 0 -  Moving slowly or fidgety/restless - - - 0 -  Suicidal thoughts - - - 0 -  PHQ-9 Score - - - 0 -  Difficult doing work/chores - - - - -  Some recent data might be hidden   PHQ-2/9 Result reviewed  Fall Risk: Fall Risk  06/30/2020 06/16/2020 05/17/2020 03/10/2020 12/11/2019  Falls in the past year? 0 0 0 0 0  Number falls in past yr: 0 0 0 0 0  Injury with Fall? 0 0 0 0 0  Comment - - - - -  Risk for fall due to : - - - - No Fall Risks  Follow up - - Falls evaluation completed - Falls prevention discussed     Assessment & Plan  1. Viral upper respiratory tract infection 2. Sinusitis, unspecified chronicity, unspecified location She did have a recent Covid test which was negative, and do feel she has an upper respiratory infection, most likely viral.  Has been present now for about 3 days.  Was just treated for a sinusitis concern in September with an antibiotic, and he noted this is not as bad as the one she had in September, and she understood concerns with using antibiotics too frequently, and agreed to proceed as follows. We will increase the Flonase nasal spray to using  morning and at night for the next 3 to 5 days, then return to just 1 time daily.  Can continue the Atrovent nasal spray as she is taking presently. Also continue the Singulair product, and the antihistamine she is taking Would not take Sudafed, as do have concerns with her blood pressure while taking this, recommended an over-the-counter decongestant that has a phenylephrine product rather than Sudafed to use as needed. Emphasized rest and staying well-hydrated. Can use Tylenol products as needed in addition. Recommended following up if symptoms not improving or more problematic over time, and did notice he has severe increase in symptoms, with marked shortness of breath, chest pains, passing out feelings, higher fevers, she needs to be seen more emergently in an ER type setting.  She is very much in agreement with the above approach after our discussion today.  I discussed the assessment and treatment plan with the patient. The patient was provided an opportunity to ask questions and all were answered. The patient agreed with the plan and demonstrated an understanding of the instructions.  Red flags and when to present for emergency care or RTC including fevers, chest pain, shortness of breath, new/worsening/un-resolving symptoms reviewed with patient at time of visit.   The patient was advised to call back or seek an in-person evaluation if the symptoms worsen or if the condition fails to  improve as anticipated.  I provided 15 minutes of non-face-to-face time during this encounter that included discussing at length patient's sx/history, pertinent pmhx, medications, treatment and follow up plan. This time also included the necessary documentation, orders, and chart review.  Towanda Malkin, MD

## 2020-07-07 DIAGNOSIS — B351 Tinea unguium: Secondary | ICD-10-CM | POA: Diagnosis not present

## 2020-07-07 DIAGNOSIS — E1143 Type 2 diabetes mellitus with diabetic autonomic (poly)neuropathy: Secondary | ICD-10-CM | POA: Diagnosis not present

## 2020-07-07 DIAGNOSIS — L851 Acquired keratosis [keratoderma] palmaris et plantaris: Secondary | ICD-10-CM | POA: Diagnosis not present

## 2020-07-09 DIAGNOSIS — J432 Centrilobular emphysema: Secondary | ICD-10-CM | POA: Insufficient documentation

## 2020-07-09 DIAGNOSIS — J439 Emphysema, unspecified: Secondary | ICD-10-CM | POA: Insufficient documentation

## 2020-07-09 NOTE — Progress Notes (Signed)
Name: Dana Sparks   MRN: [id number]    DOB: 1950/10/20   Date:07/12/2020       Progress Note  Subjective  Chief Complaint  Follow up Diabetes  HPI   DMII with gastroparesis/CKI/neuropathy: she is taking medication as prescribed, seeing Dr. Gabriel Carina.Her hgbA1C has gone up from7.8% to 8.2%, it was up 8.2%  Last time was 6.8 % and recently down to 6.3 % on Trulicity, Synjardi and [employer]. She is getting it through [employer] and has been compliant with mediation. She has gastroparesis controlled with reglan before meals, dyslipidemia on crestor, on ACE .Shehas intermittentpolyphagia, polyuria or polydipsia. Eye exam is up to date. She has vascular disease and intermittent claudication. She is taking aspirin daily. She states glucose has been below 120's fasting, no hypoglycemic episodes    HTN:she was on norvasc 5 mg and lisiinopril and toprol xl, but bp was dropping , now on norvasc 2.5 mg and bp is at goal, but we will try increasing lisinopril to 40 and eventually try to get her off norvasc if able. She denies lower extremity edema at this time  Angina: CAD, her cardiologist is Dr. Clayborn Bigness, she is due for follow up, she skipped 2020 due to pandemic She is also on statin therapy, aspirin, beta blockerand ace and Imdur , angina under control with medical management . She was on disability for heart disease for years but in social security now. Last  Echo from 2016 showed decreased in EF 25-30% and global hypokines. She has some SOB with activity, she does not have orthopnea, denies lower extremity edema   OSA: she has been wearing CPAP with humidifier about once a week, discussed trying to increase to most days of the week.   Major Depression in remission  she is doing well on Effexor, phq 9 is normal today, no side effects of medication   Obesity:she is trying to eat healthy,back to the gym on Trulicity and BMI is now below 35,she will try to resume daily walking    Hyperlipidemia: taking statin therapy,no muscle problems, reviewed last labs with patient , LDL 66 , continue medication, no side effects of medication   Chronic bronchitis: spirometry today showed normal Fev1/FVC ration at 94% she used to smoke 1 pack daily for 50 years, quit smoking in 2014 she states has noticed mild cough in the mornings occasionally with phlegm that can be yellow or clear,  has some sob with moderate activity and occasional  wheezing. She was on Anoro but stopped because of cost, now on Advair but wants to go back on Anoro since [place] will fill out forms to get it for free.   PVD with claudication: on aspirin and statin therapy, states not walking far therefore not having leg pains, she will resume walking again.    Patient Active Problem List   Diagnosis Date Noted  . Emphysema lung ([place]) 07/09/2020  . Hypercalcemia 07/05/2020  . Chronic bronchitis ([place]) 02/22/2020  . Disease of stomach   . Hx of colonic polyps   . Hepatomegaly 07/23/2018  . Fatty liver 07/23/2018  . Aortic atherosclerosis ([place]) 07/09/2018  . Diabetes mellitus type 2, controlled, with complications ([place]) 76/22/6333  . Nasal polyp 09/03/2017  . Lipoma of back 09/03/2017  . Claudication ([place]) 06/04/2017  . LVH (left ventricular hypertrophy) 02/27/2017  . Decreased cardiac ejection fraction 02/27/2017  . Left hand weakness 10/12/2016  . Elevated antinuclear antibody (ANA) level 04/27/2016  . Angina pectoris ([place]) 11/01/2015  .  Well controlled type 2 diabetes mellitus with gastroparesis ([place]) 06/22/2015  . Low TSH level 06/22/2015  . Iron deficiency anemia 04/07/2015  . Intestinal metaplasia of gastric mucosa 03/11/2015  . CAD (coronary artery disease), native coronary artery 02/24/2015  . History of coronary artery stent placement 02/24/2015  . Chronic kidney disease, stage 3, mod decreased GFR (HCC) 02/24/2015  . Menopause 02/24/2015  . History of Helicobacter pylori infection  02/24/2015  . Mild mitral insufficiency 02/24/2015  . Mild tricuspid insufficiency 02/24/2015  . 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 ([place]) 02/24/2015  . Controlled gout 02/24/2015  . Depression, major, recurrent, mild ([place]) 02/24/2015  . Mild pulmonary hypertension ([place]) 02/24/2015  . Gastroesophageal reflux disease without esophagitis 02/24/2015  . Perennial allergic rhinitis 02/24/2015  . HLD (hyperlipidemia) 02/20/2015  . Hypertension, benign 02/20/2015  . Apnea, sleep 02/20/2015  . Controlled diabetes mellitus with stage 3 chronic kidney disease, without long-term current use of insulin ([place]) 02/20/2015    Past Surgical History:  Procedure Laterality Date  . ABDOMINAL HYSTERECTOMY     total  . CATARACT EXTRACTION    . CATARACT EXTRACTION W/PHACO Left 03/24/2019   Procedure: CATARACT EXTRACTION PHACO AND INTRAOCULAR LENS PLACEMENT ([place])  LEFT DIABETIC;  Surgeon: Eulogio Bear, MD;  Location: [facility];  Service: Ophthalmology;  Laterality: Left;  Diabetic - insulin and oral meds sleep apnea  . COLONOSCOPY  01/2014   sigmoid diverticulosis. desc colon TA, hyperplastic polyp  . COLONOSCOPY WITH PROPOFOL N/A 09/02/2018   Procedure: COLONOSCOPY WITH PROPOFOL;  Surgeon: Lucilla Lame, MD;  Location: [facility];  Service: Endoscopy;  Laterality: N/A;  . CORONARY ANGIOPLASTY WITH STENT PLACEMENT    . ESOPHAGOGASTRODUODENOSCOPY N/A 02/16/2015   negative h pylori, focal gastic intestinal metaplasia  . ESOPHAGOGASTRODUODENOSCOPY (EGD) WITH PROPOFOL N/A 09/02/2018   Procedure: ESOPHAGOGASTRODUODENOSCOPY (EGD) WITH BIOPSIES;  Surgeon: Lucilla Lame, MD;  Location: [facility];  Service: Endoscopy;  Laterality: N/A;  diabetic -insulin and oral meds sleep apnea  . TOTAL KNEE ARTHROPLASTY Right     Family History  Problem Relation Age of Onset  . Stroke Sister   . Hyperlipidemia Sister   . Alcohol abuse Brother   . Diabetes Brother    . Stroke Brother   . Hypertension Mother   . Diabetes Mother   . Heart disease Mother   . Diabetes Father   . Heart disease Father   . Hypertension Father   . Hypertension Sister   . Cancer Maternal Grandmother        Unsure  . Diabetes Maternal Grandfather   . Colon cancer Neg Hx   . Liver disease Neg Hx   . Breast cancer Neg Hx     Social History   Tobacco Use  . Smoking status: Former Smoker    Packs/day: 1.00    Years: 40.00    Pack years: 40.00    Types: Cigarettes    Start date: 09/04/1972    Quit date: 12/30/2012    Years since quitting: 7.5  . Smokeless tobacco: Never Used  . Tobacco comment: smoking cessation materials not required  Substance Use Topics  . Alcohol use: No    Alcohol/week: 0.0 standard drinks     Current Outpatient Medications:  .  ACCU-CHEK FASTCLIX LANCETS MISC, , Disp: , Rfl:  .  ACCU-CHEK SMARTVIEW test strip, Check fsbs two times daily  DMII, Disp: 100 each, Rfl: 6 .  Alcohol Swabs (B-D SINGLE USE SWABS REGULAR) PADS, 1  each by Does not apply route 4 (four) times daily., Disp: 200 each, Rfl: 2 .  allopurinol (ZYLOPRIM) 100 MG tablet, Take 1 tablet (100 mg total) by mouth daily., Disp: 90 tablet, Rfl: 1 .  amLODipine (NORVASC) 2.5 MG tablet, Take 1 tablet (2.5 mg total) by mouth daily., Disp: 90 tablet, Rfl: 1 .  aspirin EC 81 MG tablet, Take 81 mg by mouth every morning. , Disp: , Rfl:  .  BD INSULIN SYRINGE U/F 31G X 5/16" 0.3 ML MISC, , Disp: , Rfl:  .  blood glucose meter kit and supplies KIT, Dispense based on patient and insurance preference. Use up to four times daily as directed. (FOR ICD-9 250.00, 250.01)., Disp: 1 each, Rfl: 0 .  celecoxib (CELEBREX) 200 MG capsule, , Disp: , Rfl:  .  Cholecalciferol (VITAMIN D-3 PO), Take 1,000 Units by mouth daily. , Disp: , Rfl:  .  CINNAMON PO, Take 1,000 mg by mouth daily., Disp: , Rfl:  .  cloNIDine (CATAPRES) 0.1 MG tablet, Take 1 tablet (0.1 mg total) by mouth every evening., Disp: 90  tablet, Rfl: 1 .  Coenzyme Q10 (CO Q-10) 100 MG CAPS, TAKE 1 CAPSULE EVERY DAY, Disp: 90 capsule, Rfl: 1 .  cyclobenzaprine (FLEXERIL) 5 MG tablet, Take 1 tablet (5 mg total) by mouth at bedtime as needed for muscle spasms. May increase to two tabs at bedtime pending response, Disp: 30 tablet, Rfl: 0 .  DENTA 5000 PLUS 1.1 % CREA dental cream, , Disp: , Rfl:  .  Dulaglutide (TRULICITY [place]), Inject into the skin. Inject SQ once weekly, Disp: , Rfl:  .  estradiol (ESTRACE) 0.5 MG tablet, Take 1 tablet (0.5 mg total) by mouth daily., Disp: 90 tablet, Rfl: 1 .  Ferrous Sulfate (IRON) 325 (65 Fe) MG TABS, Take 325 mg by mouth daily. Nature made brand-, Disp: , Rfl:  .  fluticasone (FLONASE) 50 MCG/ACT nasal spray, PLACE 2 SPRAYS INTO BOTH NOSTRILS DAILY., Disp: 48 g, Rfl: 2 .  guaiFENesin (MUCINEX) 600 MG 12 hr tablet, Take by mouth., Disp: , Rfl:  .  Insulin Glargine (BASAGLAR KWIKPEN) 100 UNIT/ML SOPN, Inject 60 Units into the skin at bedtime., Disp: , Rfl:  .  Insulin Pen Needle (FIFTY50 PEN NEEDLES) 31G X 5 MM MISC, Use once daily, Disp: , Rfl:  .  ipratropium (ATROVENT) 0.03 % nasal spray, Place 2 sprays into both nostrils every 12 (twelve) hours., Disp: 90 mL, Rfl: 1 .  isosorbide mononitrate (IMDUR) 60 MG 24 hr tablet, TAKE 1 TABLET (60 MG TOTAL) BY MOUTH DAILY., Disp: 90 tablet, Rfl: 1 .  Lancets 28G MISC, Use 1 each 2 (two) times daily accu chek. Diagnosis: E11.22, Disp: , Rfl:  .  lisinopril (ZESTRIL) 40 MG tablet, Take 1 tablet (40 mg total) by mouth daily., Disp: 90 tablet, Rfl: 1 .  loratadine (CLARITIN) 10 MG tablet, Take 1 tablet (10 mg total) by mouth daily., Disp: 90 tablet, Rfl: 1 .  metoCLOPramide (REGLAN) 5 MG tablet, TAKE 1 TO 2 TABLETS FOUR TIMES DAILY BEFORE MEALS  AND AT BEDTIME, Disp: 120 tablet, Rfl: 0 .  metoprolol succinate (TOPROL-XL) 50 MG 24 hr tablet, Take 1 tablet (50 mg total) by mouth daily., Disp: 90 tablet, Rfl: 3 .  montelukast (SINGULAIR) 10 MG tablet, Take 1 tablet  (10 mg total) by mouth every evening., Disp: 90 tablet, Rfl: 1 .  pantoprazole (PROTONIX) 40 MG tablet, TAKE 1 TABLET (40 MG TOTAL) BY MOUTH EVERY MORNING., Disp:  90 tablet, Rfl: 1 .  rosuvastatin (CRESTOR) 20 MG tablet, TAKE 1 TABLET (20 MG TOTAL) BY MOUTH DAILY., Disp: 90 tablet, Rfl: 1 .  SODIUM FLUORIDE 5000 PPM 1.1 % PSTE, , Disp: , Rfl:  .  SYNJARDY XR 12.01-999 MG TB24, Take 2 tablets by mouth daily., Disp: , Rfl:  .  venlafaxine XR (EFFEXOR-XR) 75 MG 24 hr capsule, TAKE 1 CAPSULE EVERY DAY, Disp: 90 capsule, Rfl: 1 .  umeclidinium-vilanterol (ANORO ELLIPTA) 62.5-25 MCG/INH AEPB, Inhale 1 puff into the lungs daily., Disp: 180 each, Rfl: 1  Allergies  Allergen Reactions  . Codeine Other (See Comments)    Unknown reaction  . Contrast Media [Iodinated Diagnostic Agents] Itching  . Sulfa Antibiotics Itching    I personally reviewed active problem list, medication list, allergies, family history, social history, health maintenance with the patient/caregiver today.   ROS   Constitutional: Negative for fever or weight change.  Respiratory: Negative for cough and shortness of breath.   Cardiovascular: Negative for chest pain or palpitations.  Gastrointestinal: Negative for abdominal pain, no bowel changes.  Musculoskeletal: Negative for gait problem or joint swelling.  Skin: Negative for rash.  Neurological: Negative for dizziness or headache.  No other specific complaints in a complete review of systems (except as listed in HPI above).  Objective  Vitals:   07/12/20 0818  BP: 132/80  Pulse: 91  Resp: 16  Temp: 98.3 F (36.8 C)  TempSrc: Oral  SpO2: 95%  Weight: 216 lb 14.4 oz (98.4 kg)  Height: '5\' 7"'  (1.702 m)    Body mass index is 33.97 kg/m.  Physical Exam  Constitutional: Patient appears well-developed and well-nourished. Obese  No distress.  HEENT: head atraumatic, normocephalic, pupils equal and reactive to light, , neck supple Cardiovascular: Normal rate,  regular rhythm and normal heart sounds.  No murmur heard. No BLE edema. Pulmonary/Chest: Effort normal and breath sounds normal. No respiratory distress. Abdominal: Soft.  There is no tenderness. Psychiatric: Patient has a normal mood and affect. behavior is normal. Judgment and thought content normal.  Recent Results (from the past 2160 hour(s))  Hemoglobin A1c     Status: None   Collection Time: 06/28/20 12:00 AM  Result Value Ref Range   Hemoglobin A1C 6.3     Comment: [facility]      PHQ2/9: Depression screen [facility] 2/9 07/12/2020 06/30/2020 06/16/2020 05/17/2020 03/10/2020  Decreased Interest 0 0 0 0 0  Down, Depressed, Hopeless 0 0 0 0 0  PHQ - 2 Score 0 0 0 0 0  Altered sleeping - - - - 0  Tired, decreased energy - - - - 0  Change in appetite - - - - 0  Feeling bad or failure about yourself  - - - - 0  Trouble concentrating - - - - 0  Moving slowly or fidgety/restless - - - - 0  Suicidal thoughts - - - - 0  PHQ-9 Score - - - - 0  Difficult doing work/chores - - - - -  Some recent data might be hidden    phq 9 is negative   Fall Risk: Fall Risk  07/12/2020 06/30/2020 06/16/2020 05/17/2020 03/10/2020  Falls in the past year? 0 0 0 0 0  Number falls in past yr: 0 0 0 0 0  Injury with Fall? 0 0 0 0 0  Comment - - - - -  Risk for fall due to : - - - - -  Follow up - - -  Falls evaluation completed -     Functional Status Survey: Is the patient deaf or have difficulty hearing?: No Does the patient have difficulty seeing, even when wearing glasses/contacts?: No Does the patient have difficulty concentrating, remembering, or making decisions?: No Does the patient have difficulty walking or climbing stairs?: No Does the patient have difficulty dressing or bathing?: No Does the patient have difficulty doing errands alone such as visiting a doctor's office or shopping?: No   Assessment & Plan  1. Controlled type 2 diabetes mellitus with complication, with long-term current use of  insulin ([place])  - HM Diabetes Foot Exam  2. Hypertension, benign  We will increase to 40 mg of lisinopril and monitor  - amLODipine (NORVASC) 2.5 MG tablet; Take 1 tablet (2.5 mg total) by mouth daily.  Dispense: 90 tablet; Refill: 1 - lisinopril (ZESTRIL) 40 MG tablet; Take 1 tablet (40 mg total) by mouth daily.  Dispense: 90 tablet; Refill: 1 - metoprolol succinate (TOPROL-XL) 50 MG 24 hr tablet; Take 1 tablet (50 mg total) by mouth daily.  Dispense: 90 tablet; Refill: 3  3. Excessive sweating  - cloNIDine (CATAPRES) 0.1 MG tablet; Take 1 tablet (0.1 mg total) by mouth every evening.  Dispense: 90 tablet; Refill: 1 - venlafaxine XR (EFFEXOR-XR) 75 MG 24 hr capsule; TAKE 1 CAPSULE EVERY DAY  Dispense: 90 capsule; Refill: 1  4. Perennial allergic rhinitis  - loratadine (CLARITIN) 10 MG tablet; Take 1 tablet (10 mg total) by mouth daily.  Dispense: 90 tablet; Refill: 1  5. Seasonal and perennial allergic rhinitis  - montelukast (SINGULAIR) 10 MG tablet; Take 1 tablet (10 mg total) by mouth every evening.  Dispense: 90 tablet; Refill: 1  6. Major depression in remission (HCC)  - venlafaxine XR (EFFEXOR-XR) 75 MG 24 hr capsule; TAKE 1 CAPSULE EVERY DAY  Dispense: 90 capsule; Refill: 1  7. Mild pulmonary hypertension ([place])  She has not been compliant with CPAP machine   8. Angina pectoris ([place])  Taking Imdur, not having pain but needs to see Dr. Clayborn Bigness   9. Simple chronic bronchitis ([place])  She is currently on Advair but would like to switch to Anoro to get it for free through the company  - umeclidinium-vilanterol (ANORO ELLIPTA) 62.5-25 MCG/INH AEPB; Inhale 1 puff into the lungs daily.  Dispense: 180 each; Refill: 1  10. Dyslipidemia  Continue medication  11. Dyslipidemia associated with type 2 diabetes mellitus (HCC)  Last LDL was 56   12. Hypertension associated with diabetes ([place])

## 2020-07-12 ENCOUNTER — Ambulatory Visit (INDEPENDENT_AMBULATORY_CARE_PROVIDER_SITE_OTHER): Payer: Medicare HMO | Admitting: Family Medicine

## 2020-07-12 ENCOUNTER — Other Ambulatory Visit: Payer: Self-pay

## 2020-07-12 ENCOUNTER — Encounter: Payer: Self-pay | Admitting: Family Medicine

## 2020-07-12 VITALS — BP 132/80 | HR 91 | Temp 98.3°F | Resp 16 | Ht 67.0 in | Wt 216.9 lb

## 2020-07-12 DIAGNOSIS — R61 Generalized hyperhidrosis: Secondary | ICD-10-CM | POA: Diagnosis not present

## 2020-07-12 DIAGNOSIS — E1159 Type 2 diabetes mellitus with other circulatory complications: Secondary | ICD-10-CM

## 2020-07-12 DIAGNOSIS — F325 Major depressive disorder, single episode, in full remission: Secondary | ICD-10-CM | POA: Diagnosis not present

## 2020-07-12 DIAGNOSIS — I272 Pulmonary hypertension, unspecified: Secondary | ICD-10-CM | POA: Diagnosis not present

## 2020-07-12 DIAGNOSIS — Z794 Long term (current) use of insulin: Secondary | ICD-10-CM

## 2020-07-12 DIAGNOSIS — I10 Essential (primary) hypertension: Secondary | ICD-10-CM | POA: Diagnosis not present

## 2020-07-12 DIAGNOSIS — I209 Angina pectoris, unspecified: Secondary | ICD-10-CM

## 2020-07-12 DIAGNOSIS — E1169 Type 2 diabetes mellitus with other specified complication: Secondary | ICD-10-CM

## 2020-07-12 DIAGNOSIS — E118 Type 2 diabetes mellitus with unspecified complications: Secondary | ICD-10-CM | POA: Diagnosis not present

## 2020-07-12 DIAGNOSIS — J410 Simple chronic bronchitis: Secondary | ICD-10-CM

## 2020-07-12 DIAGNOSIS — I152 Hypertension secondary to endocrine disorders: Secondary | ICD-10-CM

## 2020-07-12 DIAGNOSIS — E785 Hyperlipidemia, unspecified: Secondary | ICD-10-CM

## 2020-07-12 DIAGNOSIS — J3089 Other allergic rhinitis: Secondary | ICD-10-CM

## 2020-07-12 DIAGNOSIS — J302 Other seasonal allergic rhinitis: Secondary | ICD-10-CM

## 2020-07-12 MED ORDER — METOPROLOL SUCCINATE ER 50 MG PO TB24: 50.0000 mg | ORAL_TABLET | Freq: Every day | ORAL | 3 refills | Status: DC

## 2020-07-12 MED ORDER — VENLAFAXINE HCL ER 75 MG PO CP24: ORAL_CAPSULE | ORAL | 1 refills | Status: DC

## 2020-07-12 MED ORDER — CLONIDINE HCL 0.1 MG PO TABS: 0.1000 mg | ORAL_TABLET | Freq: Every evening | ORAL | 1 refills | Status: DC

## 2020-07-12 MED ORDER — ALLOPURINOL 100 MG PO TABS
100.0000 mg | ORAL_TABLET | Freq: Every day | ORAL | 1 refills | Status: DC
Start: 2020-07-12 — End: 2020-10-13

## 2020-07-12 MED ORDER — LISINOPRIL 40 MG PO TABS: 40.0000 mg | ORAL_TABLET | Freq: Every day | ORAL | 1 refills | Status: DC

## 2020-07-12 MED ORDER — LORATADINE 10 MG PO TABS: 10.0000 mg | ORAL_TABLET | Freq: Every day | ORAL | 1 refills | Status: DC

## 2020-07-12 MED ORDER — ANORO ELLIPTA 62.5-25 MCG/INH IN AEPB: 1.0000 | INHALATION_SPRAY | Freq: Every day | RESPIRATORY_TRACT | 1 refills | Status: DC

## 2020-07-12 MED ORDER — AMLODIPINE BESYLATE 2.5 MG PO TABS: 2.5000 mg | ORAL_TABLET | Freq: Every day | ORAL | 1 refills | Status: DC

## 2020-07-12 MED ORDER — MONTELUKAST SODIUM 10 MG PO TABS: 10.0000 mg | ORAL_TABLET | Freq: Every evening | ORAL | 1 refills | Status: DC

## 2020-07-18 DIAGNOSIS — G4733 Obstructive sleep apnea (adult) (pediatric): Secondary | ICD-10-CM | POA: Diagnosis not present

## 2020-07-26 DIAGNOSIS — I209 Angina pectoris, unspecified: Secondary | ICD-10-CM | POA: Diagnosis not present

## 2020-07-26 DIAGNOSIS — R0602 Shortness of breath: Secondary | ICD-10-CM | POA: Diagnosis not present

## 2020-07-26 DIAGNOSIS — E785 Hyperlipidemia, unspecified: Secondary | ICD-10-CM | POA: Diagnosis not present

## 2020-07-26 DIAGNOSIS — E1159 Type 2 diabetes mellitus with other circulatory complications: Secondary | ICD-10-CM | POA: Diagnosis not present

## 2020-07-26 DIAGNOSIS — R0600 Dyspnea, unspecified: Secondary | ICD-10-CM | POA: Diagnosis not present

## 2020-07-26 DIAGNOSIS — I10 Essential (primary) hypertension: Secondary | ICD-10-CM | POA: Diagnosis not present

## 2020-07-26 DIAGNOSIS — I208 Other forms of angina pectoris: Secondary | ICD-10-CM | POA: Diagnosis not present

## 2020-07-26 DIAGNOSIS — I2510 Atherosclerotic heart disease of native coronary artery without angina pectoris: Secondary | ICD-10-CM | POA: Diagnosis not present

## 2020-07-26 DIAGNOSIS — G4733 Obstructive sleep apnea (adult) (pediatric): Secondary | ICD-10-CM | POA: Diagnosis not present

## 2020-07-28 ENCOUNTER — Other Ambulatory Visit: Payer: Self-pay | Admitting: Family Medicine

## 2020-07-28 NOTE — Telephone Encounter (Signed)
Requested medication (s) are due for refill today: n/a  Requested medication (s) are on the active medication list: Yes  Last refill: 06/29/20  Future visit scheduled: Yes  Notes to clinic:  Historical provider.    Requested Prescriptions  Pending Prescriptions Disp Refills   celecoxib (CELEBREX) 200 MG capsule [Pharmacy Med Name: CELECOXIB 200 MG Capsule] 30 capsule     Sig: TAKE 1 CAPSULE TWICE DAILY      Analgesics:  COX2 Inhibitors Failed - 07/28/2020  8:41 AM      Failed - Cr in normal range and within 360 days    Creat  Date Value Ref Range Status  03/17/2019 0.88 0.50 - 0.99 mg/dL Final    Comment:    For patients >28 years of age, the reference limit for Creatinine is approximately 13% higher for people identified as African-American. .    Creatinine, Urine  Date Value Ref Range Status  03/17/2019 39 20 - 275 mg/dL Final          Passed - HGB in normal range and within 360 days    Hemoglobin  Date Value Ref Range Status  03/10/2020 13.9 11.7 - 15.5 g/dL Final  11/01/2015 12.8 11.1 - 15.9 g/dL Final          Passed - Patient is not pregnant      Passed - Valid encounter within last 12 months    Recent Outpatient Visits           2 weeks ago Controlled type 2 diabetes mellitus with complication, with long-term current use of insulin [facility])   [facility] Steele Sizer, MD   4 weeks ago Viral upper respiratory tract infection   [place], MD   1 month ago Neck pain on left side   [facility] Steele Sizer, MD   2 months ago Sinusitis, unspecified chronicity, unspecified location   [place], MD   4 months ago Dyslipidemia associated with type 2 diabetes mellitus [facility])   [facility] Steele Sizer, MD       Future Appointments             In 2 months Ancil Boozer, Drue Stager, MD [facility], [place]   In 4 months  [facility]

## 2020-08-13 ENCOUNTER — Telehealth: Payer: Self-pay

## 2020-08-13 ENCOUNTER — Telehealth: Payer: Self-pay | Admitting: Family Medicine

## 2020-08-13 NOTE — Telephone Encounter (Signed)
Patient is calling because she dropped a form off this week regarding disability for [employer]. Patient would like to know if the form is available for pick up? Patient states that her  daughter Kennieth Rad has an appt today with Dr. Ancil Boozer at 2:00p. Would like to know could the form be given to [facility] for the patient? Please advise CB- [phone]

## 2020-08-16 ENCOUNTER — Telehealth: Payer: Self-pay | Admitting: *Deleted

## 2020-08-16 NOTE — Chronic Care Management (AMB) (Signed)
  Care Management   Note  08/16/2020 Name: Dana Sparks MRN: [id number] DOB: 01/26/1951  Dana Sparks is a 69 y.o. year old female who is a primary care patient of Steele Sizer, MD and is actively engaged with the care management team. I reached out to [name] by phone today to assist with re-scheduling a follow up visit with the Pharmacist  Follow up plan: Unsuccessful telephone outreach attempt made. A HIPAA compliant phone message was left for the patient providing contact information and requesting a return call.  The care management team will reach out to the patient again over the next 7 days.  If patient returns call to provider office, please advise to call [place] at [phone].  [place] Management

## 2020-08-16 NOTE — Chronic Care Management (AMB) (Signed)
Chronic Care Management Pharmacy Assistant   Name: Dana Sparks  MRN: [id number] DOB: July 27, 1951  Reason for Encounter: Patient Assistance Coordination  PCP : Steele Sizer, MD   08/13/2020- Patient called inquiring about Anoro inhaler patient assistance form that [facility] was working on. Informed patient Dana Sparks, [name] was out of the office for a week, will get in touch with Pharmacist to see if he is aware of PAP form.  08/16/2020- [place] notified. He is not aware of any PAP forms for patient on Anoro, he will check with office on Wednesday. Called patient she is aware of status on Anoro, she is currently using Advair and has 30 puffs left, asked patient if she need a replacement inhaler until we know more on patient assistance paperwork. Patient denied, she states the Anoro worked better but will continue to use Advair until we know more.   Allergies:   Allergies  Allergen Reactions  . Codeine Other (See Comments)    Unknown reaction  . Contrast Media [Iodinated Diagnostic Agents] Itching  . Sulfa Antibiotics Itching    Medications: Outpatient Encounter Medications as of 08/13/2020  Medication Sig  . ACCU-CHEK FASTCLIX LANCETS MISC   . ACCU-CHEK SMARTVIEW test strip Check fsbs two times daily  DMII  . Alcohol Swabs (B-D SINGLE USE SWABS REGULAR) PADS 1 each by Does not apply route 4 (four) times daily.  Marland Kitchen allopurinol (ZYLOPRIM) 100 MG tablet Take 1 tablet (100 mg total) by mouth daily.  Marland Kitchen amLODipine (NORVASC) 2.5 MG tablet Take 1 tablet (2.5 mg total) by mouth daily.  Marland Kitchen aspirin EC 81 MG tablet Take 81 mg by mouth every morning.   . BD INSULIN SYRINGE U/F 31G X 5/16" 0.3 ML MISC   . blood glucose meter kit and supplies KIT Dispense based on patient and insurance preference. Use up to four times daily as directed. (FOR ICD-9 250.00, 250.01).  . celecoxib (CELEBREX) 200 MG capsule TAKE 1 CAPSULE TWICE DAILY  . Cholecalciferol (VITAMIN D-3 PO) Take 1,000 Units by mouth daily.    Marland Kitchen CINNAMON PO Take 1,000 mg by mouth daily.  . cloNIDine (CATAPRES) 0.1 MG tablet Take 1 tablet (0.1 mg total) by mouth every evening.  . Coenzyme Q10 (CO Q-10) 100 MG CAPS TAKE 1 CAPSULE EVERY DAY  . cyclobenzaprine (FLEXERIL) 5 MG tablet Take 1 tablet (5 mg total) by mouth at bedtime as needed for muscle spasms. May increase to two tabs at bedtime pending response  . DENTA 5000 PLUS 1.1 % CREA dental cream   . Dulaglutide (TRULICITY [place]) Inject into the skin. Inject SQ once weekly  . estradiol (ESTRACE) 0.5 MG tablet Take 1 tablet (0.5 mg total) by mouth daily.  . Ferrous Sulfate (IRON) 325 (65 Fe) MG TABS Take 325 mg by mouth daily. Nature made brand-  . fluticasone (FLONASE) 50 MCG/ACT nasal spray PLACE 2 SPRAYS INTO BOTH NOSTRILS DAILY.  Marland Kitchen guaiFENesin (MUCINEX) 600 MG 12 hr tablet Take by mouth.  . Insulin Glargine (BASAGLAR KWIKPEN) 100 UNIT/ML SOPN Inject 60 Units into the skin at bedtime.  . Insulin Pen Needle (FIFTY50 PEN NEEDLES) 31G X 5 MM MISC Use once daily  . ipratropium (ATROVENT) 0.03 % nasal spray Place 2 sprays into both nostrils every 12 (twelve) hours.  . isosorbide mononitrate (IMDUR) 60 MG 24 hr tablet TAKE 1 TABLET (60 MG TOTAL) BY MOUTH DAILY.  Marland Kitchen Lancets 28G MISC Use 1 each 2 (two) times daily accu chek. Diagnosis: E11.22  . lisinopril (  ZESTRIL) 40 MG tablet Take 1 tablet (40 mg total) by mouth daily.  Marland Kitchen loratadine (CLARITIN) 10 MG tablet Take 1 tablet (10 mg total) by mouth daily.  . metoCLOPramide (REGLAN) 5 MG tablet TAKE 1 TO 2 TABLETS FOUR TIMES DAILY BEFORE MEALS  AND AT BEDTIME  . metoprolol succinate (TOPROL-XL) 50 MG 24 hr tablet Take 1 tablet (50 mg total) by mouth daily.  . montelukast (SINGULAIR) 10 MG tablet Take 1 tablet (10 mg total) by mouth every evening.  . pantoprazole (PROTONIX) 40 MG tablet TAKE 1 TABLET (40 MG TOTAL) BY MOUTH EVERY MORNING.  . rosuvastatin (CRESTOR) 20 MG tablet TAKE 1 TABLET (20 MG TOTAL) BY MOUTH DAILY.  . SODIUM FLUORIDE 5000  PPM 1.1 % PSTE   . SYNJARDY XR 12.01-999 MG TB24 Take 2 tablets by mouth daily.  Marland Kitchen umeclidinium-vilanterol (ANORO ELLIPTA) 62.5-25 MCG/INH AEPB Inhale 1 puff into the lungs daily.  Marland Kitchen venlafaxine XR (EFFEXOR-XR) 75 MG 24 hr capsule TAKE 1 CAPSULE EVERY DAY   No facility-administered encounter medications on file as of 08/13/2020.    Current Diagnosis: Patient Active Problem List   Diagnosis Date Noted  . Emphysema lung ([place]) 07/09/2020  . Hypercalcemia 07/05/2020  . Chronic bronchitis ([place]) 02/22/2020  . Disease of stomach   . Hx of colonic polyps   . Hepatomegaly 07/23/2018  . Fatty liver 07/23/2018  . Aortic atherosclerosis ([place]) 07/09/2018  . Diabetes mellitus type 2, controlled, with complications ([place]) 43/83/7793  . Nasal polyp 09/03/2017  . Lipoma of back 09/03/2017  . Claudication ([place]) 06/04/2017  . LVH (left ventricular hypertrophy) 02/27/2017  . Decreased cardiac ejection fraction 02/27/2017  . Left hand weakness 10/12/2016  . Elevated antinuclear antibody (ANA) level 04/27/2016  . Angina pectoris ([place]) 11/01/2015  . Well controlled type 2 diabetes mellitus with gastroparesis ([place]) 06/22/2015  . Low TSH level 06/22/2015  . Iron deficiency anemia 04/07/2015  . Intestinal metaplasia of gastric mucosa 03/11/2015  . CAD (coronary artery disease), native coronary artery 02/24/2015  . History of coronary artery stent placement 02/24/2015  . Chronic kidney disease, stage 3, mod decreased GFR (HCC) 02/24/2015  . Menopause 02/24/2015  . History of Helicobacter pylori infection 02/24/2015  . Mild mitral insufficiency 02/24/2015  . Mild tricuspid insufficiency 02/24/2015  . 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 Controlled gout 02/24/2015  . Depression, major, recurrent, mild ([place]) 02/24/2015  . Mild pulmonary hypertension ([place]) 02/24/2015  . Gastroesophageal reflux disease without esophagitis 02/24/2015  . Perennial allergic  rhinitis 02/24/2015  . HLD (hyperlipidemia) 02/20/2015  . Hypertension, benign 02/20/2015  . Apnea, sleep 02/20/2015  . Controlled diabetes mellitus with stage 3 chronic kidney disease, without long-term current use of insulin ([place]) 02/20/2015     Follow-Up:  Patient Assistance Coordination  08/24/2020- Doristine Counter, [name] notified and will follow up with patient on Anoro patient assistance paperwork.  Pattricia Boss, [place] Pharmacist Assistant [phone]

## 2020-08-17 DIAGNOSIS — G4733 Obstructive sleep apnea (adult) (pediatric): Secondary | ICD-10-CM | POA: Diagnosis not present

## 2020-08-19 ENCOUNTER — Other Ambulatory Visit: Payer: Self-pay

## 2020-08-19 DIAGNOSIS — M62838 Other muscle spasm: Secondary | ICD-10-CM

## 2020-08-19 DIAGNOSIS — M542 Cervicalgia: Secondary | ICD-10-CM

## 2020-08-19 MED ORDER — CYCLOBENZAPRINE HCL 5 MG PO TABS: 5.0000 mg | ORAL_TABLET | Freq: Every evening | ORAL | 0 refills | Status: DC | PRN

## 2020-08-19 MED ORDER — CELECOXIB 200 MG PO CAPS
200.0000 mg | ORAL_CAPSULE | Freq: Two times a day (BID) | ORAL | 1 refills | Status: DC
Start: 2020-08-19 — End: 2020-08-23

## 2020-08-23 ENCOUNTER — Other Ambulatory Visit: Payer: Self-pay

## 2020-08-23 DIAGNOSIS — M542 Cervicalgia: Secondary | ICD-10-CM

## 2020-08-23 DIAGNOSIS — M62838 Other muscle spasm: Secondary | ICD-10-CM

## 2020-08-23 MED ORDER — CELECOXIB 200 MG PO CAPS: 200.0000 mg | ORAL_CAPSULE | Freq: Two times a day (BID) | ORAL | 1 refills | Status: DC

## 2020-08-23 MED ORDER — CYCLOBENZAPRINE HCL 5 MG PO TABS
5.0000 mg | ORAL_TABLET | Freq: Every evening | ORAL | 0 refills | Status: DC | PRN
Start: 2020-08-23 — End: 2020-08-30

## 2020-08-23 NOTE — Chronic Care Management (AMB) (Signed)
  Care Management   Note  08/23/2020 Name: Dana Sparks MRN: [id number] DOB: 1950/10/24  [name] is a 69 y.o. year old female who is a primary care patient of Steele Sizer, MD and is actively engaged with the care management team. I reached out to [name] by phone today to assist with re-scheduling a follow up visit with the Pharmacist.  Follow up plan: Telephone appointment with care management team member scheduled for:09/24/2019  [place] Management  Direct Dial: [phone]

## 2020-08-30 ENCOUNTER — Other Ambulatory Visit: Payer: Self-pay

## 2020-08-30 DIAGNOSIS — M542 Cervicalgia: Secondary | ICD-10-CM

## 2020-08-30 DIAGNOSIS — M62838 Other muscle spasm: Secondary | ICD-10-CM

## 2020-08-30 MED ORDER — CYCLOBENZAPRINE HCL 5 MG PO TABS: 5.0000 mg | ORAL_TABLET | Freq: Every evening | ORAL | 0 refills | Status: DC | PRN

## 2020-08-30 MED ORDER — CELECOXIB 200 MG PO CAPS: 200.0000 mg | ORAL_CAPSULE | Freq: Two times a day (BID) | ORAL | 1 refills | Status: DC

## 2020-09-01 DIAGNOSIS — Z20822 Contact with and (suspected) exposure to covid-19: Secondary | ICD-10-CM | POA: Diagnosis not present

## 2020-09-02 ENCOUNTER — Telehealth: Payer: Self-pay

## 2020-09-02 NOTE — Progress Notes (Signed)
Chronic Care Management Pharmacy Assistant   Name: TAYNA SMETHURST  MRN: [id number] DOB: 15-Oct-1950  Reason for Encounter: Medication Management  [name],  69 y.o. , female  PCP : Steele Sizer, MD  Allergies:   Allergies  Allergen Reactions  . Codeine Other (See Comments)    Unknown reaction  . Contrast Media [Iodinated Diagnostic Agents] Itching  . Sulfa Antibiotics Itching    Medications: Outpatient Encounter Medications as of 09/02/2020  Medication Sig  . ACCU-CHEK FASTCLIX LANCETS MISC   . ACCU-CHEK SMARTVIEW test strip Check fsbs two times daily  DMII  . Alcohol Swabs (B-D SINGLE USE SWABS REGULAR) PADS 1 each by Does not apply route 4 (four) times daily.  Marland Kitchen allopurinol (ZYLOPRIM) 100 MG tablet Take 1 tablet (100 mg total) by mouth daily.  Marland Kitchen amLODipine (NORVASC) 2.5 MG tablet Take 1 tablet (2.5 mg total) by mouth daily.  Marland Kitchen aspirin EC 81 MG tablet Take 81 mg by mouth every morning.   . BD INSULIN SYRINGE U/F 31G X 5/16" 0.3 ML MISC   . blood glucose meter kit and supplies KIT Dispense based on patient and insurance preference. Use up to four times daily as directed. (FOR ICD-9 250.00, 250.01).  . celecoxib (CELEBREX) 200 MG capsule Take 1 capsule (200 mg total) by mouth 2 (two) times daily.  . Cholecalciferol (VITAMIN D-3 PO) Take 1,000 Units by mouth daily.   Marland Kitchen CINNAMON PO Take 1,000 mg by mouth daily.  . cloNIDine (CATAPRES) 0.1 MG tablet Take 1 tablet (0.1 mg total) by mouth every evening.  . Coenzyme Q10 (CO Q-10) 100 MG CAPS TAKE 1 CAPSULE EVERY DAY  . cyclobenzaprine (FLEXERIL) 5 MG tablet Take 1-2 tablets (5-10 mg total) by mouth at bedtime as needed for muscle spasms. May increase to two tabs at bedtime pending response  . DENTA 5000 PLUS 1.1 % CREA dental cream   . Dulaglutide (TRULICITY [place]) Inject into the skin. Inject SQ once weekly  . estradiol (ESTRACE) 0.5 MG tablet Take 1 tablet (0.5 mg total) by mouth daily.  . Ferrous Sulfate (IRON) 325 (65 Fe) MG  TABS Take 325 mg by mouth daily. Nature made brand-  . fluticasone (FLONASE) 50 MCG/ACT nasal spray PLACE 2 SPRAYS INTO BOTH NOSTRILS DAILY.  Marland Kitchen guaiFENesin (MUCINEX) 600 MG 12 hr tablet Take by mouth.  . Insulin Glargine (BASAGLAR KWIKPEN) 100 UNIT/ML SOPN Inject 60 Units into the skin at bedtime.  . Insulin Pen Needle (FIFTY50 PEN NEEDLES) 31G X 5 MM MISC Use once daily  . ipratropium (ATROVENT) 0.03 % nasal spray Place 2 sprays into both nostrils every 12 (twelve) hours.  . isosorbide mononitrate (IMDUR) 60 MG 24 hr tablet TAKE 1 TABLET (60 MG TOTAL) BY MOUTH DAILY.  Marland Kitchen Lancets 28G MISC Use 1 each 2 (two) times daily accu chek. Diagnosis: E11.22  . lisinopril (ZESTRIL) 40 MG tablet Take 1 tablet (40 mg total) by mouth daily.  Marland Kitchen loratadine (CLARITIN) 10 MG tablet Take 1 tablet (10 mg total) by mouth daily.  . metoCLOPramide (REGLAN) 5 MG tablet TAKE 1 TO 2 TABLETS FOUR TIMES DAILY BEFORE MEALS  AND AT BEDTIME  . metoprolol succinate (TOPROL-XL) 50 MG 24 hr tablet Take 1 tablet (50 mg total) by mouth daily.  . montelukast (SINGULAIR) 10 MG tablet Take 1 tablet (10 mg total) by mouth every evening.  . pantoprazole (PROTONIX) 40 MG tablet TAKE 1 TABLET (40 MG TOTAL) BY MOUTH EVERY MORNING.  . rosuvastatin (CRESTOR)  20 MG tablet TAKE 1 TABLET (20 MG TOTAL) BY MOUTH DAILY.  . SODIUM FLUORIDE 5000 PPM 1.1 % PSTE   . SYNJARDY XR 12.01-999 MG TB24 Take 2 tablets by mouth daily.  Marland Kitchen umeclidinium-vilanterol (ANORO ELLIPTA) 62.5-25 MCG/INH AEPB Inhale 1 puff into the lungs daily.  Marland Kitchen venlafaxine XR (EFFEXOR-XR) 75 MG 24 hr capsule TAKE 1 CAPSULE EVERY DAY   No facility-administered encounter medications on file as of 09/02/2020.     Current Diagnosis: Patient Active Problem List   Diagnosis Date Noted  . Emphysema lung ([place]) 07/09/2020  . Hypercalcemia 07/05/2020  . Chronic bronchitis ([place]) 02/22/2020  . Disease of stomach   . Hx of colonic polyps   . Hepatomegaly 07/23/2018  . Fatty liver  07/23/2018  . Aortic atherosclerosis ([place]) 07/09/2018  . Diabetes mellitus type 2, controlled, with complications ([place]) 71/16/5790  . Nasal polyp 09/03/2017  . Lipoma of back 09/03/2017  . Claudication ([place]) 06/04/2017  . LVH (left ventricular hypertrophy) 02/27/2017  . Decreased cardiac ejection fraction 02/27/2017  . Left hand weakness 10/12/2016  . Elevated antinuclear antibody (ANA) level 04/27/2016  . Angina pectoris ([place]) 11/01/2015  . Well controlled type 2 diabetes mellitus with gastroparesis ([place]) 06/22/2015  . Low TSH level 06/22/2015  . Iron deficiency anemia 04/07/2015  . Intestinal metaplasia of gastric mucosa 03/11/2015  . CAD (coronary artery disease), native coronary artery 02/24/2015  . History of coronary artery stent placement 02/24/2015  . Chronic kidney disease, stage 3, mod decreased GFR (HCC) 02/24/2015  . Menopause 02/24/2015  . History of Helicobacter pylori infection 02/24/2015  . Mild mitral insufficiency 02/24/2015  . Mild tricuspid insufficiency 02/24/2015  . 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 Controlled gout 02/24/2015  . Depression, major, recurrent, mild ([place]) 02/24/2015  . Mild pulmonary hypertension ([place]) 02/24/2015  . Gastroesophageal reflux disease without esophagitis 02/24/2015  . Perennial allergic rhinitis 02/24/2015  . HLD (hyperlipidemia) 02/20/2015  . Hypertension, benign 02/20/2015  . Apnea, sleep 02/20/2015  . Controlled diabetes mellitus with stage 3 chronic kidney disease, without long-term current use of insulin ([place]) 38/33/3832      Application for Alloro Ellipta completed. Patient to sign in office and bring any needed supporting documents.  Pt aware of requirements and process for application.  Steele Sizer, MD will provide prescription for application. Patient may contact me if any questions, phone number provided. Concern has been raised regarding the required 600 out of  pocket needed for patient assistance.    Follow-Up:  Pharmacy [place], [place] Pharmacist Assistant [phone]

## 2020-09-10 ENCOUNTER — Other Ambulatory Visit: Payer: Self-pay | Admitting: Emergency Medicine

## 2020-09-10 DIAGNOSIS — J410 Simple chronic bronchitis: Secondary | ICD-10-CM

## 2020-09-10 MED ORDER — ANORO ELLIPTA 62.5-25 MCG/INH IN AEPB: 1.0000 | INHALATION_SPRAY | Freq: Every day | RESPIRATORY_TRACT | 1 refills | Status: DC

## 2020-09-14 ENCOUNTER — Other Ambulatory Visit (HOSPITAL_COMMUNITY)
Admission: RE | Admit: 2020-09-14 | Discharge: 2020-09-14 | Disposition: A | Discharge status: Home / Self Care | Payer: Medicare HMO | Source: Ambulatory Visit | Attending: Family Medicine | Admitting: Family Medicine

## 2020-09-14 ENCOUNTER — Ambulatory Visit (INDEPENDENT_AMBULATORY_CARE_PROVIDER_SITE_OTHER): Payer: Medicare HMO | Admitting: Family Medicine

## 2020-09-14 ENCOUNTER — Encounter: Payer: Self-pay | Admitting: Family Medicine

## 2020-09-14 ENCOUNTER — Telehealth: Payer: Self-pay

## 2020-09-14 ENCOUNTER — Other Ambulatory Visit: Payer: Self-pay

## 2020-09-14 VITALS — BP 138/82 | HR 84 | Temp 97.8°F | Resp 16 | Ht 65.0 in | Wt 223.0 lb

## 2020-09-14 DIAGNOSIS — N898 Other specified noninflammatory disorders of vagina: Secondary | ICD-10-CM | POA: Insufficient documentation

## 2020-09-14 DIAGNOSIS — Z794 Long term (current) use of insulin: Secondary | ICD-10-CM

## 2020-09-14 DIAGNOSIS — E118 Type 2 diabetes mellitus with unspecified complications: Secondary | ICD-10-CM

## 2020-09-14 MED ORDER — FLUCONAZOLE 150 MG PO TABS: 150.0000 mg | ORAL_TABLET | ORAL | 0 refills | Status: DC

## 2020-09-14 MED ORDER — FLUCONAZOLE 150 MG PO TABS: 150.0000 mg | ORAL_TABLET | ORAL | 1 refills | Status: DC

## 2020-09-14 NOTE — Progress Notes (Signed)
Name: Dana Sparks   MRN: [id number]    DOB: 04-26-1951   Date:09/14/2020       Progress Note  Subjective  Chief Complaint  Vaginal itching/Discomfort  HPI  Vaginal discharge and itching:she has noticed recurrent vaginal itching and mild vaginal discharge , this episode started about one week ago, she has tried A&D ointment without help . She has DM, managed by Dr. Gabriel Carina and takes Synjardi, explained that GL-2 agonists can cause increase in UTI and yeast vaginitis. She states doing well in terms of DM control and does not want to change medications. We will give her diflucan to take prn  Patient Active Problem List   Diagnosis Date Noted  . Emphysema lung ([place]) 07/09/2020  . Hypercalcemia 07/05/2020  . Chronic bronchitis ([place]) 02/22/2020  . Disease of stomach   . Hx of colonic polyps   . Hepatomegaly 07/23/2018  . Fatty liver 07/23/2018  . Aortic atherosclerosis ([place]) 07/09/2018  . Diabetes mellitus type 2, controlled, with complications ([place]) 99/37/1696  . Nasal polyp 09/03/2017  . Lipoma of back 09/03/2017  . Claudication ([place]) 06/04/2017  . LVH (left ventricular hypertrophy) 02/27/2017  . Decreased cardiac ejection fraction 02/27/2017  . Left hand weakness 10/12/2016  . Elevated antinuclear antibody (ANA) level 04/27/2016  . Angina pectoris ([place]) 11/01/2015  . Well controlled type 2 diabetes mellitus with gastroparesis ([place]) 06/22/2015  . Low TSH level 06/22/2015  . Iron deficiency anemia 04/07/2015  . Intestinal metaplasia of gastric mucosa 03/11/2015  . CAD (coronary artery disease), native coronary artery 02/24/2015  . History of coronary artery stent placement 02/24/2015  . Chronic kidney disease, stage 3, mod decreased GFR ([facility]) 02/24/2015  . Menopause 02/24/2015  . History of Helicobacter pylori infection 02/24/2015  . Mild mitral insufficiency 02/24/2015  . Mild tricuspid insufficiency 02/24/2015  . 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 Controlled gout 02/24/2015  . Depression, major, recurrent, mild ([place]) 02/24/2015  . Mild pulmonary hypertension ([place]) 02/24/2015  . Gastroesophageal reflux disease without esophagitis 02/24/2015  . Perennial allergic rhinitis 02/24/2015  . HLD (hyperlipidemia) 02/20/2015  . Hypertension, benign 02/20/2015  . Apnea, sleep 02/20/2015  . Controlled diabetes mellitus with stage 3 chronic kidney disease, without long-term current use of insulin ([place]) 02/20/2015    Past Surgical History:  Procedure Laterality Date  . ABDOMINAL HYSTERECTOMY     total  . CATARACT EXTRACTION    . CATARACT EXTRACTION W/PHACO Left 03/24/2019   Procedure: CATARACT EXTRACTION PHACO AND INTRAOCULAR LENS PLACEMENT ([place])  LEFT DIABETIC;  Surgeon: Eulogio Bear, MD;  Location: [facility];  Service: Ophthalmology;  Laterality: Left;  Diabetic - insulin and oral meds sleep apnea  . COLONOSCOPY  01/2014   sigmoid diverticulosis. desc colon TA, hyperplastic polyp  . COLONOSCOPY WITH PROPOFOL N/A 09/02/2018   Procedure: COLONOSCOPY WITH PROPOFOL;  Surgeon: Lucilla Lame, MD;  Location: [facility];  Service: Endoscopy;  Laterality: N/A;  . CORONARY ANGIOPLASTY WITH STENT PLACEMENT    . ESOPHAGOGASTRODUODENOSCOPY N/A 02/16/2015   negative h pylori, focal gastic intestinal metaplasia  . ESOPHAGOGASTRODUODENOSCOPY (EGD) WITH PROPOFOL N/A 09/02/2018   Procedure: ESOPHAGOGASTRODUODENOSCOPY (EGD) WITH BIOPSIES;  Surgeon: Lucilla Lame, MD;  Location: [facility];  Service: Endoscopy;  Laterality: N/A;  diabetic -insulin and oral meds sleep apnea  . TOTAL KNEE ARTHROPLASTY Right     Family History  Problem Relation Age of Onset  . Stroke Sister   . Hyperlipidemia Sister   .  Alcohol abuse Brother   . Diabetes Brother   . Stroke Brother   . Hypertension Mother   . Diabetes Mother   . Heart disease Mother   . Diabetes Father   . Heart disease Father   . Hypertension Father    . Hypertension Sister   . Cancer Maternal Grandmother        Unsure  . Diabetes Maternal Grandfather   . Colon cancer Neg Hx   . Liver disease Neg Hx   . Breast cancer Neg Hx     Social History   Tobacco Use  . Smoking status: Former Smoker    Packs/day: 1.00    Years: 40.00    Pack years: 40.00    Types: Cigarettes    Start date: 09/04/1972    Quit date: 12/30/2012    Years since quitting: 7.7  . Smokeless tobacco: Never Used  . Tobacco comment: smoking cessation materials not required  Substance Use Topics  . Alcohol use: No    Alcohol/week: 0.0 standard drinks     Current Outpatient Medications:  .  ACCU-CHEK FASTCLIX LANCETS MISC, , Disp: , Rfl:  .  ACCU-CHEK SMARTVIEW test strip, Check fsbs two times daily  DMII, Disp: 100 each, Rfl: 6 .  Alcohol Swabs (B-D SINGLE USE SWABS REGULAR) PADS, 1 each by Does not apply route 4 (four) times daily., Disp: 200 each, Rfl: 2 .  allopurinol (ZYLOPRIM) 100 MG tablet, Take 1 tablet (100 mg total) by mouth daily., Disp: 90 tablet, Rfl: 1 .  amLODipine (NORVASC) 2.5 MG tablet, Take 1 tablet (2.5 mg total) by mouth daily., Disp: 90 tablet, Rfl: 1 .  aspirin EC 81 MG tablet, Take 81 mg by mouth every morning. , Disp: , Rfl:  .  BD INSULIN SYRINGE U/F 31G X 5/16" 0.3 ML MISC, , Disp: , Rfl:  .  blood glucose meter kit and supplies KIT, Dispense based on patient and insurance preference. Use up to four times daily as directed. (FOR ICD-9 250.00, 250.01)., Disp: 1 each, Rfl: 0 .  celecoxib (CELEBREX) 200 MG capsule, Take 1 capsule (200 mg total) by mouth 2 (two) times daily., Disp: 90 capsule, Rfl: 1 .  Cholecalciferol (VITAMIN D-3 PO), Take 1,000 Units by mouth daily. , Disp: , Rfl:  .  CINNAMON PO, Take 1,000 mg by mouth daily., Disp: , Rfl:  .  cloNIDine (CATAPRES) 0.1 MG tablet, Take 1 tablet (0.1 mg total) by mouth every evening., Disp: 90 tablet, Rfl: 1 .  Coenzyme Q10 (CO Q-10) 100 MG CAPS, TAKE 1 CAPSULE EVERY DAY, Disp: 90 capsule,  Rfl: 1 .  cyclobenzaprine (FLEXERIL) 5 MG tablet, Take 1-2 tablets (5-10 mg total) by mouth at bedtime as needed for muscle spasms. May increase to two tabs at bedtime pending response, Disp: 90 tablet, Rfl: 0 .  DENTA 5000 PLUS 1.1 % CREA dental cream, , Disp: , Rfl:  .  Dulaglutide (TRULICITY [place]), Inject into the skin. Inject SQ once weekly, Disp: , Rfl:  .  estradiol (ESTRACE) 0.5 MG tablet, Take 1 tablet (0.5 mg total) by mouth daily., Disp: 90 tablet, Rfl: 1 .  Ferrous Sulfate (IRON) 325 (65 Fe) MG TABS, Take 325 mg by mouth daily. Nature made brand-, Disp: , Rfl:  .  fluconazole (DIFLUCAN) 150 MG tablet, Take 1 tablet (150 mg total) by mouth every other day. Prn yeast, max of 3 per round of flare, Disp: 3 tablet, Rfl: 0 .  fluticasone (FLONASE) 50 MCG/ACT nasal  spray, PLACE 2 SPRAYS INTO BOTH NOSTRILS DAILY., Disp: 48 g, Rfl: 2 .  Insulin Glargine (BASAGLAR KWIKPEN) 100 UNIT/ML SOPN, Inject 60 Units into the skin at bedtime., Disp: , Rfl:  .  Insulin Pen Needle (FIFTY50 PEN NEEDLES) 31G X 5 MM MISC, Use once daily, Disp: , Rfl:  .  ipratropium (ATROVENT) 0.03 % nasal spray, Place 2 sprays into both nostrils every 12 (twelve) hours., Disp: 90 mL, Rfl: 1 .  isosorbide mononitrate (IMDUR) 60 MG 24 hr tablet, TAKE 1 TABLET (60 MG TOTAL) BY MOUTH DAILY., Disp: 90 tablet, Rfl: 1 .  Lancets 28G MISC, Use 1 each 2 (two) times daily accu chek. Diagnosis: E11.22, Disp: , Rfl:  .  lisinopril (ZESTRIL) 40 MG tablet, Take 1 tablet (40 mg total) by mouth daily., Disp: 90 tablet, Rfl: 1 .  loratadine (CLARITIN) 10 MG tablet, Take 1 tablet (10 mg total) by mouth daily., Disp: 90 tablet, Rfl: 1 .  metoCLOPramide (REGLAN) 5 MG tablet, TAKE 1 TO 2 TABLETS FOUR TIMES DAILY BEFORE MEALS  AND AT BEDTIME, Disp: 120 tablet, Rfl: 0 .  metoprolol succinate (TOPROL-XL) 50 MG 24 hr tablet, Take 1 tablet (50 mg total) by mouth daily., Disp: 90 tablet, Rfl: 3 .  montelukast (SINGULAIR) 10 MG tablet, Take 1 tablet (10 mg  total) by mouth every evening., Disp: 90 tablet, Rfl: 1 .  pantoprazole (PROTONIX) 40 MG tablet, TAKE 1 TABLET (40 MG TOTAL) BY MOUTH EVERY MORNING., Disp: 90 tablet, Rfl: 1 .  rosuvastatin (CRESTOR) 20 MG tablet, TAKE 1 TABLET (20 MG TOTAL) BY MOUTH DAILY., Disp: 90 tablet, Rfl: 1 .  SODIUM FLUORIDE 5000 PPM 1.1 % PSTE, , Disp: , Rfl:  .  SYNJARDY XR 12.01-999 MG TB24, Take 2 tablets by mouth daily., Disp: , Rfl:  .  umeclidinium-vilanterol (ANORO ELLIPTA) 62.5-25 MCG/INH AEPB, Inhale 1 puff into the lungs daily., Disp: 180 each, Rfl: 1 .  venlafaxine XR (EFFEXOR-XR) 75 MG 24 hr capsule, TAKE 1 CAPSULE EVERY DAY, Disp: 90 capsule, Rfl: 1  Allergies  Allergen Reactions  . Codeine Other (See Comments)    Unknown reaction  . Contrast Media [Iodinated Diagnostic Agents] Itching  . Sulfa Antibiotics Itching    I personally reviewed active problem list, medication list, allergies, family history, social history, health maintenance with the patient/caregiver today.   ROS  Ten systems reviewed and is negative except as mentioned in HPI   Objective  Vitals:   09/14/20 1438  BP: 138/82  Pulse: 84  Resp: 16  Temp: 97.8 F (36.6 C)  TempSrc: Oral  SpO2: 97%  Weight: 223 lb (101.2 kg)  Height: '5\' 5"'  (1.651 m)    Body mass index is 37.11 kg/m.  Physical Exam  Constitutional: Patient appears well-developed and well-nourished. Obese No distress.  HEENT: head atraumatic, normocephalic, pupils equal and reactive to light,  neck supple Cardiovascular: Normal rate, regular rhythm and normal heart sounds.  No murmur heard. No BLE edema. Pulmonary/Chest: Effort normal and breath sounds normal. No respiratory distress. Abdominal: Soft.  There is no tenderness. GYN: offered to do a pelvic but she states she is fine , no vaginal bleeding and not redness just feels irritated Psychiatric: Patient has a normal mood and affect. behavior is normal. Judgment and thought content normal.  Recent  Results (from the past 2160 hour(s))  Hemoglobin A1c     Status: None   Collection Time: 06/28/20 12:00 AM  Result Value Ref Range   Hemoglobin A1C 6.3  Comment: [facility]      PHQ2/9: Depression screen [facility] 2/9 09/14/2020 07/12/2020 06/30/2020 06/16/2020 05/17/2020  Decreased Interest 0 0 0 0 0  Down, Depressed, Hopeless 0 0 0 0 0  PHQ - 2 Score 0 0 0 0 0  Altered sleeping - - - - -  Tired, decreased energy - - - - -  Change in appetite - - - - -  Feeling bad or failure about yourself  - - - - -  Trouble concentrating - - - - -  Moving slowly or fidgety/restless - - - - -  Suicidal thoughts - - - - -  PHQ-9 Score - - - - -  Difficult doing work/chores - - - - -  Some recent data might be hidden    phq 9 is negative   Fall Risk: Fall Risk  09/14/2020 07/12/2020 06/30/2020 06/16/2020 05/17/2020  Falls in the past year? 0 0 0 0 0  Number falls in past yr: 0 0 0 0 0  Injury with Fall? 0 0 0 0 0  Comment - - - - -  Risk for fall due to : - - - - -  Follow up - - - - Falls evaluation completed     Functional Status Survey: Is the patient deaf or have difficulty hearing?: No Does the patient have difficulty seeing, even when wearing glasses/contacts?: No Does the patient have difficulty concentrating, remembering, or making decisions?: No Does the patient have difficulty walking or climbing stairs?: No Does the patient have difficulty dressing or bathing?: No Does the patient have difficulty doing errands alone such as visiting a doctor's office or shopping?: No    Assessment & Plan  1. Vaginal itching  - Cervicovaginal ancillary only - fluconazole (DIFLUCAN) 150 MG tablet; Take 1 tablet (150 mg total) by mouth every other day. Prn yeast, max of 3 per round of flare  Dispense: 3 tablet; Refill: 0  2. Controlled type 2 diabetes mellitus with complication, with long-term current use of insulin ([place])  Explained side effects of Synjardi

## 2020-09-16 ENCOUNTER — Telehealth: Payer: Self-pay | Admitting: Family Medicine

## 2020-09-16 LAB — CERVICOVAGINAL ANCILLARY ONLY
Bacterial Vaginitis (gardnerella): NEGATIVE
Candida Glabrata: POSITIVE — AB
Candida Vaginitis: POSITIVE — AB
Chlamydia: NEGATIVE
Comment: NEGATIVE
Comment: NEGATIVE
Comment: NEGATIVE
Comment: NEGATIVE
Comment: NEGATIVE
Comment: NORMAL
Neisseria Gonorrhea: NEGATIVE
Trichomonas: NEGATIVE

## 2020-09-16 NOTE — Telephone Encounter (Signed)
Patient is calling back to receive her lab results. Please advise CB- [phone]

## 2020-09-16 NOTE — Telephone Encounter (Signed)
Went over her lab results.

## 2020-09-22 ENCOUNTER — Telehealth: Payer: Self-pay

## 2020-09-22 NOTE — Progress Notes (Signed)
Left Voice message to confirmed patient telephone appointment on 09/23/2020 for CCM at 11:00 am with [name] the Clinical pharmacist.   [place] Pharmacist Assistant [phone]

## 2020-09-23 ENCOUNTER — Telehealth: Payer: Medicare HMO

## 2020-09-23 NOTE — Chronic Care Management (AMB) (Deleted)
Chronic Care Management Pharmacy  Name: TORA PRUNTY  MRN: [id number] DOB: November 12, 1950  Chief Complaint/ HPI  [name],  70 y.o. , female presents for their Follow-Up CCM visit with the clinical pharmacist via telephone due to COVID-19 Pandemic.  PCP : Steele Sizer, MD  Their chronic conditions include: HTN, DM, COPD   CAD, GERD HLD, CKD, gout, depression,   Office Visits: 09/14/20: Patient presented to Dr. Ancil Boozer for vaginal itching. Fluconazole 150 mg started. Mucinex stopped (pt no longer taking)  07/12/20: Patient presented to Dr. Ancil Boozer for follow-up. Amlodipine decreased to 33m, lisinopril increased to 40 mg daily. Citalopram and Wixela stopped. Anoro started.  Consult Visit: 06/28/20: Patient presented to Dr. SGabriel Carina(endocrinology)   Medications: Outpatient Encounter Medications as of 09/23/2020  Medication Sig  . ACCU-CHEK FASTCLIX LANCETS MISC   . ACCU-CHEK SMARTVIEW test strip Check fsbs two times daily  DMII  . Alcohol Swabs (B-D SINGLE USE SWABS REGULAR) PADS 1 each by Does not apply route 4 (four) times daily.  .Marland Kitchenallopurinol (ZYLOPRIM) 100 MG tablet Take 1 tablet (100 mg total) by mouth daily.  .Marland KitchenamLODipine (NORVASC) 2.5 MG tablet Take 1 tablet (2.5 mg total) by mouth daily.  .Marland Kitchenaspirin EC 81 MG tablet Take 81 mg by mouth every morning.   . BD INSULIN SYRINGE U/F 31G X 5/16" 0.3 ML MISC   . blood glucose meter kit and supplies KIT Dispense based on patient and insurance preference. Use up to four times daily as directed. (FOR ICD-9 250.00, 250.01).  . celecoxib (CELEBREX) 200 MG capsule Take 1 capsule (200 mg total) by mouth 2 (two) times daily.  . Cholecalciferol (VITAMIN D-3 PO) Take 1,000 Units by mouth daily.   .Marland KitchenCINNAMON PO Take 1,000 mg by mouth daily.  . cloNIDine (CATAPRES) 0.1 MG tablet Take 1 tablet (0.1 mg total) by mouth every evening.  . Coenzyme Q10 (CO Q-10) 100 MG CAPS TAKE 1 CAPSULE EVERY DAY  . cyclobenzaprine (FLEXERIL) 5 MG tablet Take 1-2  tablets (5-10 mg total) by mouth at bedtime as needed for muscle spasms. May increase to two tabs at bedtime pending response  . DENTA 5000 PLUS 1.1 % CREA dental cream   . Dulaglutide (TRULICITY [place]) Inject into the skin. Inject SQ once weekly  . estradiol (ESTRACE) 0.5 MG tablet Take 1 tablet (0.5 mg total) by mouth daily.  . Ferrous Sulfate (IRON) 325 (65 Fe) MG TABS Take 325 mg by mouth daily. Nature made brand-  . fluconazole (DIFLUCAN) 150 MG tablet Take 1 tablet (150 mg total) by mouth every other day. Prn yeast, max of 3 per flare  . fluticasone (FLONASE) 50 MCG/ACT nasal spray PLACE 2 SPRAYS INTO BOTH NOSTRILS DAILY.  .Marland KitchenInsulin Glargine (BASAGLAR KWIKPEN) 100 UNIT/ML SOPN Inject 60 Units into the skin at bedtime.  . Insulin Pen Needle (FIFTY50 PEN NEEDLES) 31G X 5 MM MISC Use once daily  . ipratropium (ATROVENT) 0.03 % nasal spray Place 2 sprays into both nostrils every 12 (twelve) hours.  . isosorbide mononitrate (IMDUR) 60 MG 24 hr tablet TAKE 1 TABLET (60 MG TOTAL) BY MOUTH DAILY.  .Marland KitchenLancets 28G MISC Use 1 each 2 (two) times daily accu chek. Diagnosis: E11.22  . lisinopril (ZESTRIL) 40 MG tablet Take 1 tablet (40 mg total) by mouth daily.  .Marland Kitchenloratadine (CLARITIN) 10 MG tablet Take 1 tablet (10 mg total) by mouth daily.  . metoCLOPramide (REGLAN) 5 MG tablet TAKE 1 TO 2 TABLETS FOUR  TIMES DAILY BEFORE MEALS  AND AT BEDTIME  . metoprolol succinate (TOPROL-XL) 50 MG 24 hr tablet Take 1 tablet (50 mg total) by mouth daily.  . montelukast (SINGULAIR) 10 MG tablet Take 1 tablet (10 mg total) by mouth every evening.  . pantoprazole (PROTONIX) 40 MG tablet TAKE 1 TABLET (40 MG TOTAL) BY MOUTH EVERY MORNING.  . rosuvastatin (CRESTOR) 20 MG tablet TAKE 1 TABLET (20 MG TOTAL) BY MOUTH DAILY.  . SODIUM FLUORIDE 5000 PPM 1.1 % PSTE   . SYNJARDY XR 12.01-999 MG TB24 Take 2 tablets by mouth daily.  Marland Kitchen umeclidinium-vilanterol (ANORO ELLIPTA) 62.5-25 MCG/INH AEPB Inhale 1 puff into the lungs daily.  Marland Kitchen  venlafaxine XR (EFFEXOR-XR) 75 MG 24 hr capsule TAKE 1 CAPSULE EVERY DAY   No facility-administered encounter medications on file as of 09/23/2020.    Financial Resource Strain: Low Risk   . Difficulty of Paying Living Expenses: Not very hard    Current Diagnosis/Assessment:  Goals Addressed   None    Hypertension   BP goal is:  {CHL HP UPSTREAM Pharmacist BP [phone]}  Office blood pressures are  BP Readings from Last 3 Encounters:  09/14/20 138/82  07/12/20 132/80  06/16/20 132/78   Patient checks BP at home {CHL HP BP Monitoring Frequency:[phone]} Patient home BP readings are ranging: ***  Patient has failed these meds in the past: *** Patient is currently {CHL Controlled/Uncontrolled:[phone]} on the following medications:  . Amlodipine 2.5 mg daily  . Clonidine 0.1 mg QHS  . Imdur 60 mg daily  . Lisinopril 40 mg daily  . Metoprolol XL 50 mg daily   We discussed {CHL HP Upstream Pharmacy discussion:[phone]}  Plan  Continue {CHL HP Upstream Pharmacy Plans:[phone]}   Hyperlipidemia   History of CAD  LDL goal < 70  Last lipids Lab Results  Component Value Date   CHOL 126 03/10/2020   HDL 53 03/10/2020   LDLCALC 56 03/10/2020   TRIG 89 03/10/2020   CHOLHDL 2.4 03/10/2020   Hepatic Function Latest Ref Rng & Units 03/17/2019 08/16/2018 07/15/2018  Total Protein 6.1 - 8.1 g/dL 7.0 6.8 7.1  Albumin 3.5 - 5.0 g/dL - 3.9 4.0  AST 10 - 35 U/L '12 22 15  ' ALT 6 - 29 U/L '12 16 13  ' Alk Phosphatase 38 - 126 U/L - 61 58  Total Bilirubin 0.2 - 1.2 mg/dL 0.4 0.5 0.6     The ASCVD Risk score ([place]., et al., 2013) failed to calculate for the following reasons:   The valid total cholesterol range is 130 to 320 mg/dL   Patient has failed these meds in past: *** Patient is currently {CHL Controlled/Uncontrolled:[phone]} on the following medications:  . Rosuvastatin 20 mg daily   We discussed:  {CHL HP Upstream Pharmacy  discussion:[phone]}  Plan  Continue {CHL HP Upstream Pharmacy DUKGU:[id number]}    Diabetes   Managed by Dr. Gabriel Carina   Recent Relevant Labs: Lab Results  Component Value Date/Time   HGBA1C 6.3 06/28/2020 12:00 AM   HGBA1C 6.8 02/10/2020 09:46 AM   MICROALBUR 15.9 11/05/2019 12:00 AM   MICROALBUR 0.8 03/17/2019 08:54 AM   MICROALBUR 50 03/13/2016 09:38 AM   MICROALBUR 20 06/28/2015 09:10 AM     Checking BG: Daily  Recent pre-meal BG readings: 100 Patient is currently controlled on the following medications: medications:  . Trulicity 1.5 mg weekly  . Basaglar 60 units QHS  . Synjardy XR 12.01-999 mg 2 tablets daily    Last  diabetic Foot exam:  Lab Results  Component Value Date/Time   HMDIABEYEEXA No Retinopathy 12/01/2019 12:00 AM   HMDIABEYEEXA No Retinopathy 12/01/2019 12:00 AM    Last diabetic Eye exam:  Lab Results  Component Value Date/Time   HMDIABFOOTEX Dr. Marjory Sneddon and Fungal  09/23/2018 12:00 AM     We discussed:  PAP?   Plan  Continue current medications   COPD / Asthma / Tobacco    Eosinophil count:   Lab Results  Component Value Date/Time   EOSPCT 3.3 03/10/2020 10:50 AM   EOSPCT 1.6 05/08/2012 11:28 AM  %                               Eos (Absolute):  Lab Results  Component Value Date/Time   EOSABS 221 03/10/2020 10:50 AM   EOSABS 0.3 11/01/2015 11:16 AM   EOSABS 0.2 05/08/2012 11:28 AM    Tobacco Status:  Social History   Tobacco Use  Smoking Status Former Smoker  . Packs/day: 1.00  . Years: 40.00  . Pack years: 40.00  . Types: Cigarettes  . Start date: 09/04/1972  . Quit date: 12/30/2012  . Years since quitting: 7.7  Smokeless Tobacco Never Used  Tobacco Comment   smoking cessation materials not required    Patient has failed these meds in past: Advair - cost Patient is currently controlled on the following medications: Wixela, Singulair, loratadine, Flonase Using maintenance inhaler regularly? Yes Frequency  of rescue inhaler use:  never  We discussed:   Didn't get rescue inhaler Wixela isn't as good, but breathing OK  Plan  Continue current medications  Medication Management    We discussed:   Plan  Continue current medication management strategy  Follow up: 3 month phone visit  [facility] [phone]

## 2020-09-29 ENCOUNTER — Telehealth: Payer: Medicare HMO

## 2020-09-29 ENCOUNTER — Telehealth: Payer: Self-pay

## 2020-09-29 DIAGNOSIS — E1142 Type 2 diabetes mellitus with diabetic polyneuropathy: Secondary | ICD-10-CM | POA: Diagnosis not present

## 2020-09-29 LAB — HEMOGLOBIN A1C: Hemoglobin A1C: 6.4

## 2020-09-29 NOTE — Progress Notes (Signed)
Chronic Care Management Pharmacy Assistant   Name: Dana Sparks  MRN: [id number] DOB: 1950-12-27  Reason for Encounter: Medication Review  PCP : Steele Sizer, MD  Allergies:   Allergies  Allergen Reactions  . Codeine Other (See Comments)    Unknown reaction  . Contrast Media [Iodinated Diagnostic Agents] Itching  . Sulfa Antibiotics Itching    Medications: Outpatient Encounter Medications as of 09/29/2020  Medication Sig  . ACCU-CHEK FASTCLIX LANCETS MISC   . ACCU-CHEK SMARTVIEW test strip Check fsbs two times daily  DMII  . Alcohol Swabs (B-D SINGLE USE SWABS REGULAR) PADS 1 each by Does not apply route 4 (four) times daily.  Marland Kitchen allopurinol (ZYLOPRIM) 100 MG tablet Take 1 tablet (100 mg total) by mouth daily.  Marland Kitchen amLODipine (NORVASC) 2.5 MG tablet Take 1 tablet (2.5 mg total) by mouth daily.  Marland Kitchen aspirin EC 81 MG tablet Take 81 mg by mouth every morning.   . BD INSULIN SYRINGE U/F 31G X 5/16" 0.3 ML MISC   . blood glucose meter kit and supplies KIT Dispense based on patient and insurance preference. Use up to four times daily as directed. (FOR ICD-9 250.00, 250.01).  . celecoxib (CELEBREX) 200 MG capsule Take 1 capsule (200 mg total) by mouth 2 (two) times daily.  . Cholecalciferol (VITAMIN D-3 PO) Take 1,000 Units by mouth daily.   Marland Kitchen CINNAMON PO Take 1,000 mg by mouth daily.  . cloNIDine (CATAPRES) 0.1 MG tablet Take 1 tablet (0.1 mg total) by mouth every evening.  . Coenzyme Q10 (CO Q-10) 100 MG CAPS TAKE 1 CAPSULE EVERY DAY  . cyclobenzaprine (FLEXERIL) 5 MG tablet Take 1-2 tablets (5-10 mg total) by mouth at bedtime as needed for muscle spasms. May increase to two tabs at bedtime pending response  . DENTA 5000 PLUS 1.1 % CREA dental cream   . Dulaglutide (TRULICITY ) Inject into the skin. Inject SQ once weekly  . estradiol (ESTRACE) 0.5 MG tablet Take 1 tablet (0.5 mg total) by mouth daily.  . Ferrous Sulfate (IRON) 325 (65 Fe) MG TABS Take 325 mg by mouth daily.  Nature made brand-  . fluconazole (DIFLUCAN) 150 MG tablet Take 1 tablet (150 mg total) by mouth every other day. Prn yeast, max of 3 per flare  . fluticasone (FLONASE) 50 MCG/ACT nasal spray PLACE 2 SPRAYS INTO BOTH NOSTRILS DAILY.  Marland Kitchen Insulin Glargine (BASAGLAR KWIKPEN) 100 UNIT/ML SOPN Inject 60 Units into the skin at bedtime.  . Insulin Pen Needle (FIFTY50 PEN NEEDLES) 31G X 5 MM MISC Use once daily  . ipratropium (ATROVENT) 0.03 % nasal spray Place 2 sprays into both nostrils every 12 (twelve) hours.  . isosorbide mononitrate (IMDUR) 60 MG 24 hr tablet TAKE 1 TABLET (60 MG TOTAL) BY MOUTH DAILY.  Marland Kitchen Lancets 28G MISC Use 1 each 2 (two) times daily accu chek. Diagnosis: E11.22  . lisinopril (ZESTRIL) 40 MG tablet Take 1 tablet (40 mg total) by mouth daily.  Marland Kitchen loratadine (CLARITIN) 10 MG tablet Take 1 tablet (10 mg total) by mouth daily.  . metoCLOPramide (REGLAN) 5 MG tablet TAKE 1 TO 2 TABLETS FOUR TIMES DAILY BEFORE MEALS  AND AT BEDTIME  . metoprolol succinate (TOPROL-XL) 50 MG 24 hr tablet Take 1 tablet (50 mg total) by mouth daily.  . montelukast (SINGULAIR) 10 MG tablet Take 1 tablet (10 mg total) by mouth every evening.  . pantoprazole (PROTONIX) 40 MG tablet TAKE 1 TABLET (40 MG TOTAL) BY MOUTH EVERY MORNING.  Marland Kitchen  rosuvastatin (CRESTOR) 20 MG tablet TAKE 1 TABLET (20 MG TOTAL) BY MOUTH DAILY.  . SODIUM FLUORIDE 5000 PPM 1.1 % PSTE   . SYNJARDY XR 12.01-999 MG TB24 Take 2 tablets by mouth daily.  Marland Kitchen umeclidinium-vilanterol (ANORO ELLIPTA) 62.5-25 MCG/INH AEPB Inhale 1 puff into the lungs daily.  Marland Kitchen venlafaxine XR (EFFEXOR-XR) 75 MG 24 hr capsule TAKE 1 CAPSULE EVERY DAY   No facility-administered encounter medications on file as of 09/29/2020.    Current Diagnosis: Patient Active Problem List   Diagnosis Date Noted  . Emphysema lung ([place]) 07/09/2020  . Hypercalcemia 07/05/2020  . Chronic bronchitis ([place]) 02/22/2020  . Disease of stomach   . Hx of colonic polyps   . Hepatomegaly  07/23/2018  . Fatty liver 07/23/2018  . Aortic atherosclerosis ([place]) 07/09/2018  . Diabetes mellitus type 2, controlled, with complications ([place]) 66/02/44  . Nasal polyp 09/03/2017  . Lipoma of back 09/03/2017  . Claudication ([place]) 06/04/2017  . LVH (left ventricular hypertrophy) 02/27/2017  . Decreased cardiac ejection fraction 02/27/2017  . Left hand weakness 10/12/2016  . Elevated antinuclear antibody (ANA) level 04/27/2016  . Angina pectoris ([place]) 11/01/2015  . Well controlled type 2 diabetes mellitus with gastroparesis ([place]) 06/22/2015  . Low TSH level 06/22/2015  . Iron deficiency anemia 04/07/2015  . Intestinal metaplasia of gastric mucosa 03/11/2015  . CAD (coronary artery disease), native coronary artery 02/24/2015  . History of coronary artery stent placement 02/24/2015  . Chronic kidney disease, stage 3, mod decreased GFR ([facility]) 02/24/2015  . Menopause 02/24/2015  . History of Helicobacter pylori infection 02/24/2015  . Mild mitral insufficiency 02/24/2015  . Mild tricuspid insufficiency 02/24/2015  . 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 Controlled gout 02/24/2015  . Depression, major, recurrent, mild ([place]) 02/24/2015  . Mild pulmonary hypertension ([place]) 02/24/2015  . Gastroesophageal reflux disease without esophagitis 02/24/2015  . Perennial allergic rhinitis 02/24/2015  . HLD (hyperlipidemia) 02/20/2015  . Hypertension, benign 02/20/2015  . Apnea, sleep 02/20/2015  . Controlled diabetes mellitus with stage 3 chronic kidney disease, without long-term current use of insulin ([place]) 02/20/2015    Goals Addressed   None    Reviewed chart and adherence measures. Per insurance data patient is 30-39 % adherent to Glipizide for Diabetes, 80-89% adherent to Rosuvastatin for cholesterol  and 90-99% adherent to lisinopril for hyoertension . Patient does  not meet goal for Glipizide, will notify [name], Clinical pharmacist.     Follow-Up:  Pharmacist Review   [name] Clinical Pharmacist Assistant [phone]

## 2020-09-29 NOTE — Chronic Care Management (AMB) (Deleted)
Chronic Care Management Pharmacy  Name: Dana Sparks  MRN: [id number] DOB: 1950/11/03  Chief Complaint/ HPI  Dana Sparks,  70 y.o. , female presents for their Follow-Up CCM visit with the clinical pharmacist via telephone due to COVID-19 Pandemic.  PCP : Steele Sizer, MD  Their chronic conditions include: HTN, DM, COPD   CAD, GERD HLD, CKD, gout, depression,   Office Visits: 09/14/20: Patient presented to Dr. Ancil Boozer for vaginal itching. Fluconazole 150 mg started. Mucinex stopped (pt no longer taking)  07/12/20: Patient presented to Dr. Ancil Boozer for follow-up. Amlodipine decreased to 56m, lisinopril increased to 40 mg daily. Citalopram and Wixela stopped. Anoro started.  Consult Visit: 06/28/20: Patient presented to Dr. SGabriel Carina(endocrinology)   Medications: Outpatient Encounter Medications as of 09/29/2020  Medication Sig  . ACCU-CHEK FASTCLIX LANCETS MISC   . ACCU-CHEK SMARTVIEW test strip Check fsbs two times daily  DMII  . Alcohol Swabs (B-D SINGLE USE SWABS REGULAR) PADS 1 each by Does not apply route 4 (four) times daily.  .Marland Kitchenallopurinol (ZYLOPRIM) 100 MG tablet Take 1 tablet (100 mg total) by mouth daily.  .Marland KitchenamLODipine (NORVASC) 2.5 MG tablet Take 1 tablet (2.5 mg total) by mouth daily.  .Marland Kitchenaspirin EC 81 MG tablet Take 81 mg by mouth every morning.   . BD INSULIN SYRINGE U/F 31G X 5/16" 0.3 ML MISC   . blood glucose meter kit and supplies KIT Dispense based on patient and insurance preference. Use up to four times daily as directed. (FOR ICD-9 250.00, 250.01).  . celecoxib (CELEBREX) 200 MG capsule Take 1 capsule (200 mg total) by mouth 2 (two) times daily.  . Cholecalciferol (VITAMIN D-3 PO) Take 1,000 Units by mouth daily.   .Marland KitchenCINNAMON PO Take 1,000 mg by mouth daily.  . cloNIDine (CATAPRES) 0.1 MG tablet Take 1 tablet (0.1 mg total) by mouth every evening.  . Coenzyme Q10 (CO Q-10) 100 MG CAPS TAKE 1 CAPSULE EVERY DAY  . cyclobenzaprine (FLEXERIL) 5 MG tablet Take 1-2  tablets (5-10 mg total) by mouth at bedtime as needed for muscle spasms. May increase to two tabs at bedtime pending response  . DENTA 5000 PLUS 1.1 % CREA dental cream   . Dulaglutide (TRULICITY [place]) Inject into the skin. Inject SQ once weekly  . estradiol (ESTRACE) 0.5 MG tablet Take 1 tablet (0.5 mg total) by mouth daily.  . Ferrous Sulfate (IRON) 325 (65 Fe) MG TABS Take 325 mg by mouth daily. Nature made brand-  . fluconazole (DIFLUCAN) 150 MG tablet Take 1 tablet (150 mg total) by mouth every other day. Prn yeast, max of 3 per flare  . fluticasone (FLONASE) 50 MCG/ACT nasal spray PLACE 2 SPRAYS INTO BOTH NOSTRILS DAILY.  .Marland KitchenInsulin Glargine (BASAGLAR KWIKPEN) 100 UNIT/ML SOPN Inject 60 Units into the skin at bedtime.  . Insulin Pen Needle (FIFTY50 PEN NEEDLES) 31G X 5 MM MISC Use once daily  . ipratropium (ATROVENT) 0.03 % nasal spray Place 2 sprays into both nostrils every 12 (twelve) hours.  . isosorbide mononitrate (IMDUR) 60 MG 24 hr tablet TAKE 1 TABLET (60 MG TOTAL) BY MOUTH DAILY.  .Marland KitchenLancets 28G MISC Use 1 each 2 (two) times daily accu chek. Diagnosis: E11.22  . lisinopril (ZESTRIL) 40 MG tablet Take 1 tablet (40 mg total) by mouth daily.  .Marland Kitchenloratadine (CLARITIN) 10 MG tablet Take 1 tablet (10 mg total) by mouth daily.  . metoCLOPramide (REGLAN) 5 MG tablet TAKE 1 TO 2 TABLETS FOUR  TIMES DAILY BEFORE MEALS  AND AT BEDTIME  . metoprolol succinate (TOPROL-XL) 50 MG 24 hr tablet Take 1 tablet (50 mg total) by mouth daily.  . montelukast (SINGULAIR) 10 MG tablet Take 1 tablet (10 mg total) by mouth every evening.  . pantoprazole (PROTONIX) 40 MG tablet TAKE 1 TABLET (40 MG TOTAL) BY MOUTH EVERY MORNING.  . rosuvastatin (CRESTOR) 20 MG tablet TAKE 1 TABLET (20 MG TOTAL) BY MOUTH DAILY.  . SODIUM FLUORIDE 5000 PPM 1.1 % PSTE   . SYNJARDY XR 12.01-999 MG TB24 Take 2 tablets by mouth daily.  Marland Kitchen umeclidinium-vilanterol (ANORO ELLIPTA) 62.5-25 MCG/INH AEPB Inhale 1 puff into the lungs daily.  Marland Kitchen  venlafaxine XR (EFFEXOR-XR) 75 MG 24 hr capsule TAKE 1 CAPSULE EVERY DAY   No facility-administered encounter medications on file as of 09/29/2020.    Financial Resource Strain: Low Risk   . Difficulty of Paying Living Expenses: Not very hard    Current Diagnosis/Assessment:  Goals Addressed   None    Hypertension   BP goal is:  {CHL HP UPSTREAM Pharmacist BP ranges:[phone]}  Office blood pressures are  BP Readings from Last 3 Encounters:  09/14/20 138/82  07/12/20 132/80  06/16/20 132/78   Patient checks BP at home {CHL HP BP Monitoring Frequency:[phone]} Patient home BP readings are ranging: ***  Patient has failed these meds in the past: *** Patient is currently {CHL Controlled/Uncontrolled:[phone]} on the following medications:  . Amlodipine 2.5 mg daily  . Clonidine 0.1 mg QHS  . Imdur 60 mg daily  . Lisinopril 40 mg daily  . Metoprolol XL 50 mg daily   We discussed {CHL HP Upstream Pharmacy discussion:[phone]}  Plan  Continue {CHL HP Upstream Pharmacy [phone]}   Hyperlipidemia   History of CAD  LDL goal < 70  Last lipids Lab Results  Component Value Date   CHOL 126 03/10/2020   HDL 53 03/10/2020   LDLCALC 56 03/10/2020   TRIG 89 03/10/2020   CHOLHDL 2.4 03/10/2020   Hepatic Function Latest Ref Rng & Units 03/17/2019 08/16/2018 07/15/2018  Total Protein 6.1 - 8.1 g/dL 7.0 6.8 7.1  Albumin 3.5 - 5.0 g/dL - 3.9 4.0  AST 10 - 35 U/L '12 22 15  ' ALT 6 - 29 U/L '12 16 13  ' Alk Phosphatase 38 - 126 U/L - 61 58  Total Bilirubin 0.2 - 1.2 mg/dL 0.4 0.5 0.6     The ASCVD Risk score (Cromwell., et al., 2013) failed to calculate for the following reasons:   The valid total cholesterol range is 130 to 320 mg/dL   Patient has failed these meds in past: *** Patient is currently {CHL Controlled/Uncontrolled:[phone]} on the following medications:  . Rosuvastatin 20 mg daily   We discussed:  {CHL HP Upstream Pharmacy  discussion:[phone]}  Plan  Continue {CHL HP Upstream Pharmacy CZYSA:[id number]}    Diabetes   Managed by Dr. Gabriel Carina   Recent Relevant Labs: Lab Results  Component Value Date/Time   HGBA1C 6.3 06/28/2020 12:00 AM   HGBA1C 6.8 02/10/2020 09:46 AM   MICROALBUR 15.9 11/05/2019 12:00 AM   MICROALBUR 0.8 03/17/2019 08:54 AM   MICROALBUR 50 03/13/2016 09:38 AM   MICROALBUR 20 06/28/2015 09:10 AM     Checking BG: Daily  Recent pre-meal BG readings: 100 Patient is currently controlled on the following medications: medications:  . Trulicity 1.5 mg weekly  . Basaglar 60 units QHS  . Synjardy XR 12.01-999 mg 2 tablets daily    Last  diabetic Foot exam:  Lab Results  Component Value Date/Time   HMDIABEYEEXA No Retinopathy 12/01/2019 12:00 AM   HMDIABEYEEXA No Retinopathy 12/01/2019 12:00 AM    Last diabetic Eye exam:  Lab Results  Component Value Date/Time   HMDIABFOOTEX Dr. Marjory Sneddon and Fungal  09/23/2018 12:00 AM     We discussed:  PAP?   Plan  Continue current medications   COPD / Asthma / Tobacco    Eosinophil count:   Lab Results  Component Value Date/Time   EOSPCT 3.3 03/10/2020 10:50 AM   EOSPCT 1.6 05/08/2012 11:28 AM  %                               Eos (Absolute):  Lab Results  Component Value Date/Time   EOSABS 221 03/10/2020 10:50 AM   EOSABS 0.3 11/01/2015 11:16 AM   EOSABS 0.2 05/08/2012 11:28 AM    Tobacco Status:  Social History   Tobacco Use  Smoking Status Former Smoker  . Packs/day: 1.00  . Years: 40.00  . Pack years: 40.00  . Types: Cigarettes  . Start date: 09/04/1972  . Quit date: 12/30/2012  . Years since quitting: 7.7  Smokeless Tobacco Never Used  Tobacco Comment   smoking cessation materials not required    Patient has failed these meds in past: Advair - cost Patient is currently controlled on the following medications: Wixela, Singulair, loratadine, Flonase Using maintenance inhaler regularly? Yes Frequency  of rescue inhaler use:  never  We discussed:   Didn't get rescue inhaler Wixela isn't as good, but breathing OK  Plan  Continue current medications  Medication Management    We discussed:   Plan  Continue current medication management strategy  Follow up: 3 month phone visit  [facility] [phone]

## 2020-09-30 ENCOUNTER — Other Ambulatory Visit: Payer: Self-pay | Admitting: Family Medicine

## 2020-09-30 DIAGNOSIS — M62838 Other muscle spasm: Secondary | ICD-10-CM

## 2020-09-30 DIAGNOSIS — M542 Cervicalgia: Secondary | ICD-10-CM

## 2020-10-01 ENCOUNTER — Other Ambulatory Visit: Payer: Self-pay | Admitting: Family Medicine

## 2020-10-01 ENCOUNTER — Other Ambulatory Visit: Payer: Self-pay

## 2020-10-01 DIAGNOSIS — N951 Menopausal and female climacteric states: Secondary | ICD-10-CM

## 2020-10-01 DIAGNOSIS — M542 Cervicalgia: Secondary | ICD-10-CM

## 2020-10-01 DIAGNOSIS — M62838 Other muscle spasm: Secondary | ICD-10-CM

## 2020-10-01 MED ORDER — ESTRADIOL 0.5 MG PO TABS
0.5000 mg | ORAL_TABLET | Freq: Every day | ORAL | 1 refills | Status: DC
Start: 2020-10-01 — End: 2020-10-05

## 2020-10-05 ENCOUNTER — Other Ambulatory Visit: Payer: Self-pay

## 2020-10-05 DIAGNOSIS — E1159 Type 2 diabetes mellitus with other circulatory complications: Secondary | ICD-10-CM | POA: Diagnosis not present

## 2020-10-05 DIAGNOSIS — N951 Menopausal and female climacteric states: Secondary | ICD-10-CM

## 2020-10-05 DIAGNOSIS — Z794 Long term (current) use of insulin: Secondary | ICD-10-CM | POA: Diagnosis not present

## 2020-10-05 DIAGNOSIS — E1169 Type 2 diabetes mellitus with other specified complication: Secondary | ICD-10-CM | POA: Diagnosis not present

## 2020-10-05 DIAGNOSIS — E1142 Type 2 diabetes mellitus with diabetic polyneuropathy: Secondary | ICD-10-CM | POA: Diagnosis not present

## 2020-10-05 DIAGNOSIS — E785 Hyperlipidemia, unspecified: Secondary | ICD-10-CM | POA: Diagnosis not present

## 2020-10-05 MED ORDER — ESTRADIOL 0.5 MG PO TABS
0.5000 mg | ORAL_TABLET | Freq: Every day | ORAL | 1 refills | Status: DC
Start: 2020-10-05 — End: 2021-04-08

## 2020-10-07 ENCOUNTER — Telehealth: Payer: Self-pay

## 2020-10-07 DIAGNOSIS — E1143 Type 2 diabetes mellitus with diabetic autonomic (poly)neuropathy: Secondary | ICD-10-CM | POA: Diagnosis not present

## 2020-10-07 DIAGNOSIS — B351 Tinea unguium: Secondary | ICD-10-CM | POA: Diagnosis not present

## 2020-10-07 NOTE — Chronic Care Management (AMB) (Signed)
Chronic Care Management Pharmacy Assistant   Name: Dana Sparks  MRN: [id number] DOB: [birth year]  Reason for Encounter: Patient Assistance Coordination  PCP : Dana Sizer, MD  10/07/2020- Patient called inquiring about Dana Sparks inhaler from [name] patient assistance program. Patient was asked to call today from previous [name] to follow up. Patient aware that Dana Sparks, CPP was able to fax form, I will call Dana Sparks to follow up on application status and shipment. Will call patient back with updates.   Allergies:   Allergies  Allergen Reactions   Codeine Other (See Comments)    Unknown reaction   Contrast Media [Iodinated Diagnostic Agents] Itching   Sulfa Antibiotics Itching    Medications: Outpatient Encounter Medications as of 10/07/2020  Medication Sig   ACCU-CHEK FASTCLIX LANCETS MISC    ACCU-CHEK SMARTVIEW test strip Check fsbs two times daily  DMII   Alcohol Swabs (B-D SINGLE USE SWABS REGULAR) PADS 1 each by Does not apply route 4 (four) times daily.   allopurinol (ZYLOPRIM) 100 MG tablet Take 1 tablet (100 mg total) by mouth daily.   amLODipine (NORVASC) 2.5 MG tablet Take 1 tablet (2.5 mg total) by mouth daily.   aspirin EC 81 MG tablet Take 81 mg by mouth every morning.    BD INSULIN SYRINGE U/F 31G X 5/16" 0.3 ML MISC    blood glucose meter kit and supplies KIT Dispense based on patient and insurance preference. Use up to four times daily as directed. (FOR ICD-9 250.00, 250.01).   celecoxib (CELEBREX) 200 MG capsule Take 1 capsule (200 mg total) by mouth 2 (two) times daily.   Cholecalciferol (VITAMIN D-3 PO) Take 1,000 Units by mouth daily.    CINNAMON PO Take 1,000 mg by mouth daily.   cloNIDine (CATAPRES) 0.1 MG tablet Take 1 tablet (0.1 mg total) by mouth every evening.   Coenzyme Q10 (CO Q-10) 100 MG CAPS TAKE 1 CAPSULE EVERY DAY   cyclobenzaprine (FLEXERIL) 5 MG tablet TAKE 1 TO 2 TABLETS AT BEDTIME AS NEEDED FOR MUSCLE SPASM(S).MAY INC TO 2  TABS PENDING RESPONSE.   DENTA 5000 PLUS 1.1 % CREA dental cream    Dulaglutide (TRULICITY [place]) Inject into the skin. Inject SQ once weekly   estradiol (ESTRACE) 0.5 MG tablet Take 1 tablet (0.5 mg total) by mouth daily.   Ferrous Sulfate (IRON) 325 (65 Fe) MG TABS Take 325 mg by mouth daily. Nature made brand-   fluconazole (DIFLUCAN) 150 MG tablet Take 1 tablet (150 mg total) by mouth every other day. Prn yeast, max of 3 per flare   fluticasone (FLONASE) 50 MCG/ACT nasal spray PLACE 2 SPRAYS INTO BOTH NOSTRILS DAILY.   Insulin Glargine (BASAGLAR KWIKPEN) 100 UNIT/ML SOPN Inject 60 Units into the skin at bedtime.   Insulin Pen Needle (FIFTY50 PEN NEEDLES) 31G X 5 MM MISC Use once daily   ipratropium (ATROVENT) 0.03 % nasal spray Place 2 sprays into both nostrils every 12 (twelve) hours.   isosorbide mononitrate (IMDUR) 60 MG 24 hr tablet TAKE 1 TABLET (60 MG TOTAL) BY MOUTH DAILY.   Lancets 28G MISC Use 1 each 2 (two) times daily accu chek. Diagnosis: E11.22   lisinopril (ZESTRIL) 40 MG tablet Take 1 tablet (40 mg total) by mouth daily.   loratadine (CLARITIN) 10 MG tablet Take 1 tablet (10 mg total) by mouth daily.   metoCLOPramide (REGLAN) 5 MG tablet TAKE 1 TO 2 TABLETS FOUR TIMES DAILY BEFORE MEALS  AND AT BEDTIME  metoprolol succinate (TOPROL-XL) 50 MG 24 hr tablet Take 1 tablet (50 mg total) by mouth daily.   montelukast (SINGULAIR) 10 MG tablet Take 1 tablet (10 mg total) by mouth every evening.   pantoprazole (PROTONIX) 40 MG tablet TAKE 1 TABLET (40 MG TOTAL) BY MOUTH EVERY MORNING.   rosuvastatin (CRESTOR) 20 MG tablet TAKE 1 TABLET (20 MG TOTAL) BY MOUTH DAILY.   SODIUM FLUORIDE 5000 PPM 1.1 % PSTE    SYNJARDY XR 12.01-999 MG TB24 Take 2 tablets by mouth daily.   umeclidinium-vilanterol (ANORO ELLIPTA) 62.5-25 MCG/INH AEPB Inhale 1 puff into the lungs daily.   venlafaxine XR (EFFEXOR-XR) 75 MG 24 hr capsule TAKE 1 CAPSULE EVERY DAY   No facility-administered  encounter medications on file as of 10/07/2020.    Current Diagnosis: Patient Active Problem List   Diagnosis Date Noted   Emphysema lung ([place]) 07/09/2020   Hypercalcemia 07/05/2020   Chronic bronchitis ([place]) 02/22/2020   Disease of stomach    Hx of colonic polyps    Hepatomegaly 07/23/2018   Fatty liver 07/23/2018   Aortic atherosclerosis ([place]) 07/09/2018   Diabetes mellitus type 2, controlled, with complications ([place]) 76/80/8811   Nasal polyp 09/03/2017   Lipoma of back 09/03/2017   Claudication ([place]) 06/04/2017   LVH (left ventricular hypertrophy) 02/27/2017   Decreased cardiac ejection fraction 02/27/2017   Left hand weakness 10/12/2016   Elevated antinuclear antibody (ANA) level 04/27/2016   Angina pectoris ([place]) 11/01/2015   Well controlled type 2 diabetes mellitus with gastroparesis ([place]) 06/22/2015   Low TSH level 06/22/2015   Iron deficiency anemia 04/07/2015   Intestinal metaplasia of gastric mucosa 03/11/2015   CAD (coronary artery disease), native coronary artery 02/24/2015   History of coronary artery stent placement 02/24/2015   Chronic kidney disease, stage 3, mod decreased GFR ([place]) 02/24/2015   Menopause 11/16/9456   History of Helicobacter pylori infection 02/24/2015   Mild mitral insufficiency 02/24/2015   Mild tricuspid insufficiency 02/24/2015   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 ([place]) 02/24/2015   Controlled gout 02/24/2015   Depression, major, recurrent, mild ([place]) 02/24/2015   Mild pulmonary hypertension ([place]) 02/24/2015   Gastroesophageal reflux disease without esophagitis 02/24/2015   Perennial allergic rhinitis 02/24/2015   HLD (hyperlipidemia) 02/20/2015   Hypertension, benign 02/20/2015   Apnea, sleep 02/20/2015   Controlled diabetes mellitus with stage 3 chronic kidney disease, without long-term current use of insulin ([place]) 02/20/2015    Follow-Up:  Patient Assistance  Coordination   10/26/2020- Called Dana Sparks to check the status of patient medication, spoke with Dana Sparks, patients application was received but they are needing an OOP expense of medications sent and prescription to be refaxed with a cover letter. Called patient to give updates, no answer, left message to return call.  Dana Sparks, CPP notified.  Pattricia Boss, [place] Pharmacist Assistant [phone]

## 2020-10-11 NOTE — Progress Notes (Signed)
Name: Dana Sparks   MRN: [id number]    DOB: May 31, 1951   Date:10/13/2020       Progress Note  Subjective  Chief Complaint  Follow up   HPI   DMII with gastroparesis/CKI/neuropathy: she is taking medication as prescribed, seeing Dr. Gabriel Carina.Her hgbA1C has gone up from7.8% to 8.2%, 8.2%  6.8 % 6.3 % and now 6.4 % she is on Trulicity, Synjardi and Basaglar 60 units. . She is getting it through [employer] and has been compliant with mediation. She has gastroparesis controlled with reglan before meals, dyslipidemia on crestor, on ACE for microalbuminuria - she has a cough and we will switch to losartan  .Shehas intermittentpolyphagia, she denies  polyuria or polydipsia.Eye exam is up to date. She has vascular disease and intermittent claudication. She is taking aspirin daily. She states glucose fasting has been 80-112. Discussed considering going down on Basaglar and ask Dr. Gabriel Carina for glucagon rx.   HTN:she was on Norvasc 2.5 mg and Lisiinopril and Toprol xl, but bp was dropping , today bp is at goal, but we will try switching from lisinopril to losartan since she has a cough and also to try getting bp a little lower  Angina: CAD, her cardiologist is Dr. Clayborn Bigness, she has a stress test next week,  she skipped 2020 due to pandemic She is also on statin therapy, aspirin, beta blockerand ace and Imdur , angina under control with medical management . Shewas on disability for heart disease for years but in social security now. LastEcho from 2016 showed decreased in EF 25-30% and global hypokines. She has some SOB with activity, she does not have orthopnea, denies lower extremity edema . No chest pian or palpitation. Angina managed with medication at this time  OSA: shehas been wearing CPAP with humidifier at most once a week, discussed trying to increase to most days of the week. Explained that she has pulmonary hypertension and needs to try being compliant with machine    Major Depression in remission  she is doing well on Effexor, phq 9 is normal today, no side effects of medication . She states feeling well for a while but wants to continue medication since it also helps with hot flashes   Obesity:she is trying to eat healthy,back to the gym on Trulicity and BMI is now below 35,weight is stable over the past year, up only 3 lbs   Hyperlipidemia: taking statin therapy,no muscle problems, reviewed last labs with patient , LDL 66, continue medication, denies myalgias , she has occasional cramps   Chronic bronchitis:  she used to smoke 1 pack daily for 50 years, quit smoking in 2014she has morning cough, phlegm is clear,   has some sob with moderate activity and occasional  wheezing.She has been using Anoro, no need for prn medication  PVD with claudication: on aspirin and statin therapy, she has not been walking lately and not sure how far she can go, she states have not been motivated to go the gym lately   Perennial Allergic Rhinitis: she is doing well today, uses humidifier at night , has nasal sprays at home   Neck pain: she states doing better, taking flexeril prn only now   Hot Flashes: she is on Effexor , Premarin, black cohosh,  and clonidine, a few years ago her symptoms were severe when she stopped Effexor  Patient Active Problem List   Diagnosis Date Noted  . Emphysema lung ([place]) 07/09/2020  . Hypercalcemia 07/05/2020  . Chronic  bronchitis ([place]) 02/22/2020  . Disease of stomach   . Hx of colonic polyps   . Hepatomegaly 07/23/2018  . Fatty liver 07/23/2018  . Aortic atherosclerosis ([place]) 07/09/2018  . Diabetes mellitus type 2, controlled, with complications ([place]) [id number]  . Nasal polyp 09/03/2017  . Lipoma of back 09/03/2017  . Claudication ([place]) 06/04/2017  . LVH (left ventricular hypertrophy) 02/27/2017  . Decreased cardiac ejection fraction 02/27/2017  . Left hand weakness 10/12/2016  . Elevated antinuclear antibody (ANA)  level 04/27/2016  . Angina pectoris ([place]) 11/01/2015  . Well controlled type 2 diabetes mellitus with gastroparesis ([place]) 06/22/2015  . Low TSH level 06/22/2015  . Iron deficiency anemia 04/07/2015  . Intestinal metaplasia of gastric mucosa 03/11/2015  . CAD (coronary artery disease), native coronary artery 02/24/2015  . History of coronary artery stent placement 02/24/2015  . Chronic kidney disease, stage 3, mod decreased GFR (HCC) 02/24/2015  . Menopause 02/24/2015  . History of Helicobacter pylori infection 02/24/2015  . Mild mitral insufficiency 02/24/2015  . Mild tricuspid insufficiency 02/24/2015  . 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 ([place]) 02/24/2015  . Controlled gout 02/24/2015  . Depression, major, recurrent, mild ([place]) 02/24/2015  . Mild pulmonary hypertension ([place]) 02/24/2015  . Gastroesophageal reflux disease without esophagitis 02/24/2015  . Perennial allergic rhinitis 02/24/2015  . HLD (hyperlipidemia) 02/20/2015  . Hypertension, benign 02/20/2015  . Apnea, sleep 02/20/2015  . Controlled diabetes mellitus with stage 3 chronic kidney disease, without long-term current use of insulin ([place]) 02/20/2015    Past Surgical History:  Procedure Laterality Date  . ABDOMINAL HYSTERECTOMY     total  . CATARACT EXTRACTION    . CATARACT EXTRACTION W/PHACO Left 03/24/2019   Procedure: CATARACT EXTRACTION PHACO AND INTRAOCULAR LENS PLACEMENT ([place])  LEFT DIABETIC;  Surgeon: Eulogio Bear, MD;  Location: [facility];  Service: Ophthalmology;  Laterality: Left;  Diabetic - insulin and oral meds sleep apnea  . COLONOSCOPY  01/2014   sigmoid diverticulosis. desc colon TA, hyperplastic polyp  . COLONOSCOPY WITH PROPOFOL N/A 09/02/2018   Procedure: COLONOSCOPY WITH PROPOFOL;  Surgeon: Lucilla Lame, MD;  Location: [facility];  Service: Endoscopy;  Laterality: N/A;  . CORONARY ANGIOPLASTY WITH STENT PLACEMENT    . ESOPHAGOGASTRODUODENOSCOPY  N/A 02/16/2015   negative h pylori, focal gastic intestinal metaplasia  . ESOPHAGOGASTRODUODENOSCOPY (EGD) WITH PROPOFOL N/A 09/02/2018   Procedure: ESOPHAGOGASTRODUODENOSCOPY (EGD) WITH BIOPSIES;  Surgeon: Lucilla Lame, MD;  Location: [facility];  Service: Endoscopy;  Laterality: N/A;  diabetic -insulin and oral meds sleep apnea  . TOTAL KNEE ARTHROPLASTY Right     Family History  Problem Relation Age of Onset  . Stroke Sister   . Hyperlipidemia Sister   . Alcohol abuse Brother   . Diabetes Brother   . Stroke Brother   . Hypertension Mother   . Diabetes Mother   . Heart disease Mother   . Diabetes Father   . Heart disease Father   . Hypertension Father   . Hypertension Sister   . Cancer Maternal Grandmother        Unsure  . Diabetes Maternal Grandfather   . Colon cancer Neg Hx   . Liver disease Neg Hx   . Breast cancer Neg Hx     Social History   Tobacco Use  . Smoking status: Former Smoker    Packs/day: 1.00    Years: 40.00    Pack years: 40.00    Types: Cigarettes    Start  date: 09/04/1972    Quit date: 12/30/2012    Years since quitting: 7.7  . Smokeless tobacco: Never Used  . Tobacco comment: smoking cessation materials not required  Substance Use Topics  . Alcohol use: No    Alcohol/week: 0.0 standard drinks     Current Outpatient Medications:  .  ACCU-CHEK FASTCLIX LANCETS MISC, , Disp: , Rfl:  .  ACCU-CHEK SMARTVIEW test strip, Check fsbs two times daily  DMII, Disp: 100 each, Rfl: 6 .  Alcohol Swabs (B-D SINGLE USE SWABS REGULAR) PADS, 1 each by Does not apply route 4 (four) times daily., Disp: 200 each, Rfl: 2 .  aspirin EC 81 MG tablet, Take 81 mg by mouth every morning. , Disp: , Rfl:  .  BD INSULIN SYRINGE U/F 31G X 5/16" 0.3 ML MISC, , Disp: , Rfl:  .  blood glucose meter kit and supplies KIT, Dispense based on patient and insurance preference. Use up to four times daily as directed. (FOR ICD-9 250.00, 250.01)., Disp: 1 each, Rfl: 0 .   celecoxib (CELEBREX) 200 MG capsule, Take 1 capsule (200 mg total) by mouth 2 (two) times daily., Disp: 90 capsule, Rfl: 1 .  Cholecalciferol (VITAMIN D-3 PO), Take 1,000 Units by mouth daily. , Disp: , Rfl:  .  CINNAMON PO, Take 1,000 mg by mouth daily., Disp: , Rfl:  .  cloNIDine (CATAPRES) 0.1 MG tablet, Take 1 tablet (0.1 mg total) by mouth every evening., Disp: 90 tablet, Rfl: 1 .  Coenzyme Q10 (CO Q-10) 100 MG CAPS, TAKE 1 CAPSULE EVERY DAY, Disp: 90 capsule, Rfl: 1 .  cyclobenzaprine (FLEXERIL) 5 MG tablet, TAKE 1 TO 2 TABLETS AT BEDTIME AS NEEDED FOR MUSCLE SPASM(S).MAY INC TO 2 TABS PENDING RESPONSE., Disp: 90 tablet, Rfl: 0 .  DENTA 5000 PLUS 1.1 % CREA dental cream, , Disp: , Rfl:  .  Dulaglutide 1.5 MG/0.5ML SOPN, Inject 1.5 mg into the skin once a week., Disp: , Rfl:  .  estradiol (ESTRACE) 0.5 MG tablet, Take 1 tablet (0.5 mg total) by mouth daily., Disp: 90 tablet, Rfl: 1 .  Ferrous Sulfate (IRON) 325 (65 Fe) MG TABS, Take 325 mg by mouth daily. Nature made brand-, Disp: , Rfl:  .  fluconazole (DIFLUCAN) 150 MG tablet, Take 1 tablet (150 mg total) by mouth every other day. Prn yeast, max of 3 per flare, Disp: 9 tablet, Rfl: 1 .  fluticasone (FLONASE) 50 MCG/ACT nasal spray, PLACE 2 SPRAYS INTO BOTH NOSTRILS DAILY., Disp: 48 g, Rfl: 2 .  Insulin Glargine (BASAGLAR KWIKPEN) 100 UNIT/ML SOPN, Inject 60 Units into the skin at bedtime., Disp: , Rfl:  .  Insulin Pen Needle (FIFTY50 PEN NEEDLES) 31G X 5 MM MISC, Use once daily, Disp: , Rfl:  .  ipratropium (ATROVENT) 0.03 % nasal spray, Place 2 sprays into both nostrils every 12 (twelve) hours., Disp: 90 mL, Rfl: 1 .  Lancets 28G MISC, Use 1 each 2 (two) times daily accu chek. Diagnosis: E11.22, Disp: , Rfl:  .  loratadine (CLARITIN) 10 MG tablet, Take 1 tablet (10 mg total) by mouth daily., Disp: 90 tablet, Rfl: 1 .  losartan (COZAAR) 100 MG tablet, Take 1 tablet (100 mg total) by mouth daily. In place of lisinopril - has a cough, Disp: 90  tablet, Rfl: 0 .  metoCLOPramide (REGLAN) 5 MG tablet, TAKE 1 TO 2 TABLETS FOUR TIMES DAILY BEFORE MEALS  AND AT BEDTIME, Disp: 120 tablet, Rfl: 0 .  metoprolol succinate (TOPROL-XL) 50  MG 24 hr tablet, Take 1 tablet (50 mg total) by mouth daily., Disp: 90 tablet, Rfl: 3 .  SODIUM FLUORIDE 5000 PPM 1.1 % PSTE, , Disp: , Rfl:  .  SYNJARDY XR 12.01-999 MG TB24, Take 2 tablets by mouth daily., Disp: , Rfl:  .  umeclidinium-vilanterol (ANORO ELLIPTA) 62.5-25 MCG/INH AEPB, Inhale 1 puff into the lungs daily., Disp: 180 each, Rfl: 1 .  allopurinol (ZYLOPRIM) 100 MG tablet, Take 1 tablet (100 mg total) by mouth daily., Disp: 90 tablet, Rfl: 1 .  amLODipine (NORVASC) 2.5 MG tablet, Take 1 tablet (2.5 mg total) by mouth daily., Disp: 90 tablet, Rfl: 1 .  isosorbide mononitrate (IMDUR) 60 MG 24 hr tablet, Take 1 tablet (60 mg total) by mouth daily., Disp: 90 tablet, Rfl: 1 .  montelukast (SINGULAIR) 10 MG tablet, Take 1 tablet (10 mg total) by mouth every evening., Disp: 90 tablet, Rfl: 1 .  pantoprazole (PROTONIX) 40 MG tablet, Take 1 tablet (40 mg total) by mouth every morning., Disp: 90 tablet, Rfl: 1 .  rosuvastatin (CRESTOR) 20 MG tablet, Take 1 tablet (20 mg total) by mouth daily., Disp: 90 tablet, Rfl: 1 .  venlafaxine XR (EFFEXOR-XR) 75 MG 24 hr capsule, TAKE 1 CAPSULE EVERY DAY, Disp: 90 capsule, Rfl: 1  Allergies  Allergen Reactions  . Codeine Other (See Comments)    Unknown reaction  . Contrast Media [Iodinated Diagnostic Agents] Itching  . Lisinopril Cough  . Sulfa Antibiotics Itching    I personally reviewed active problem list, medication list, allergies, family history, social history, health maintenance with the patient/caregiver today.   ROS  Constitutional: Negative for fever or weight change.  Respiratory: Negative for cough and shortness of breath.   Cardiovascular: Negative for chest pain or palpitations.  Gastrointestinal: Negative for abdominal pain, no bowel changes.   Musculoskeletal: Negative for gait problem or joint swelling.  Skin: Negative for rash.  Neurological: Negative for dizziness or headache.  No other specific complaints in a complete review of systems (except as listed in HPI above).  Objective  Vitals:   10/13/20 0837  BP: 136/84  Pulse: 87  Resp: 16  Temp: 97.7 F (36.5 C)  TempSrc: Oral  SpO2: 99%  Weight: 219 lb 3.2 oz (99.4 kg)  Height: '5\' 5"'  (1.651 m)    Body mass index is 36.48 kg/m.  Physical Exam  Constitutional: Patient appears well-developed and well-nourished. Obese  No distress.  HEENT: head atraumatic, normocephalic, pupils equal and reactive to light,neck supple Cardiovascular: Normal rate, regular rhythm and normal heart sounds.  No murmur heard. No BLE edema. Pulmonary/Chest: Effort normal and breath sounds normal. No respiratory distress. Abdominal: Soft.  There is no tenderness. Psychiatric: Patient has a normal mood and affect. behavior is normal. Judgment and thought content normal.  Recent Results (from the past 2160 hour(s))  Cervicovaginal ancillary only     Status: Abnormal   Collection Time: 09/14/20  3:05 PM  Result Value Ref Range   Neisseria Gonorrhea Negative    Chlamydia Negative    Trichomonas Negative    Bacterial Vaginitis (gardnerella) Negative    Candida Vaginitis Positive (A)    Candida Glabrata Positive (A)    Comment Normal Reference Range Candida Species - Negative    Comment Normal Reference Range Candida Galbrata - Negative    Comment Normal Reference Range Trichomonas - Negative    Comment Normal Reference [place] Chlamydia - Negative    Comment      Normal Reference Range  Neisseria Gonorrhea - Negative   Comment      Normal Reference Range Bacterial Vaginosis - Negative  Hemoglobin A1c     Status: None   Collection Time: 09/29/20 12:00 AM  Result Value Ref Range   Hemoglobin A1C 6.4      PHQ2/9: Depression screen [facility] 2/9 10/13/2020 09/14/2020 07/12/2020 06/30/2020  06/16/2020  Decreased Interest 0 0 0 0 0  Down, Depressed, Hopeless 0 0 0 0 0  PHQ - 2 Score 0 0 0 0 0  Altered sleeping - - - - -  Tired, decreased energy - - - - -  Change in appetite - - - - -  Feeling bad or failure about yourself  - - - - -  Trouble concentrating - - - - -  Moving slowly or fidgety/restless - - - - -  Suicidal thoughts - - - - -  PHQ-9 Score - - - - -  Difficult doing work/chores - - - - -  Some recent data might be hidden    phq 9 is negative   Fall Risk: Fall Risk  10/13/2020 09/14/2020 07/12/2020 06/30/2020 06/16/2020  Falls in the past year? 0 0 0 0 0  Number falls in past yr: 0 0 0 0 0  Injury with Fall? 0 0 0 0 0  Comment - - - - -  Risk for fall due to : - - - - -  Follow up - - - - -     Functional Status Survey: Is the patient deaf or have difficulty hearing?: No Does the patient have difficulty seeing, even when wearing glasses/contacts?: No Does the patient have difficulty concentrating, remembering, or making decisions?: No Does the patient have difficulty walking or climbing stairs?: No Does the patient have difficulty dressing or bathing?: No Does the patient have difficulty doing errands alone such as visiting a doctor's office or shopping?: No    Assessment & Plan  1. Controlled type 2 diabetes mellitus with complication, with long-term current use of insulin (HCC)  - Urine Microalbumin w/creat. ratio  2. Hypertension, benign  - amLODipine (NORVASC) 2.5 MG tablet; Take 1 tablet (2.5 mg total) by mouth daily.  Dispense: 90 tablet; Refill: 1  3. Angina pectoris (HCC)  - isosorbide mononitrate (IMDUR) 60 MG 24 hr tablet; Take 1 tablet (60 mg total) by mouth daily.  Dispense: 90 tablet; Refill: 1  4. Mild pulmonary hypertension ([place])   5. Dyslipidemia  - rosuvastatin (CRESTOR) 20 MG tablet; Take 1 tablet (20 mg total) by mouth daily.  Dispense: 90 tablet; Refill: 1  6. Dyslipidemia associated with type 2 diabetes mellitus  ([place])   7. Hypertension associated with diabetes ([place])  We will switch from lisinopril to losartan to improved bp , also because she has a cough  8. Neck pain on left side  Taking prn mediation  9. Recurrent major depressive disorder, in full remission ([place])   10. Gastroesophageal reflux disease without esophagitis  - pantoprazole (PROTONIX) 40 MG tablet; Take 1 tablet (40 mg total) by mouth every morning.  Dispense: 90 tablet; Refill: 1  11. Claudication ([place])   12. Obstructive sleep apnea syndrome   13. Major depression in remission (HCC)  - venlafaxine XR (EFFEXOR-XR) 75 MG 24 hr capsule; TAKE 1 CAPSULE EVERY DAY  Dispense: 90 capsule; Refill: 1  14. Simple chronic bronchitis ([place])   15. Seasonal and perennial allergic rhinitis  - montelukast (SINGULAIR) 10 MG tablet; Take 1 tablet (10 mg total)  by mouth every evening.  Dispense: 90 tablet; Refill: 1  16. Excessive sweating  - venlafaxine XR (EFFEXOR-XR) 75 MG 24 hr capsule; TAKE 1 CAPSULE EVERY DAY  Dispense: 90 capsule; Refill: 1

## 2020-10-13 ENCOUNTER — Ambulatory Visit (INDEPENDENT_AMBULATORY_CARE_PROVIDER_SITE_OTHER): Payer: Medicare HMO | Admitting: Family Medicine

## 2020-10-13 ENCOUNTER — Encounter: Payer: Self-pay | Admitting: Family Medicine

## 2020-10-13 ENCOUNTER — Other Ambulatory Visit: Payer: Self-pay

## 2020-10-13 VITALS — BP 136/84 | HR 87 | Temp 97.7°F | Resp 16 | Ht 65.0 in | Wt 219.2 lb

## 2020-10-13 DIAGNOSIS — I739 Peripheral vascular disease, unspecified: Secondary | ICD-10-CM

## 2020-10-13 DIAGNOSIS — F3342 Major depressive disorder, recurrent, in full remission: Secondary | ICD-10-CM

## 2020-10-13 DIAGNOSIS — E1159 Type 2 diabetes mellitus with other circulatory complications: Secondary | ICD-10-CM | POA: Diagnosis not present

## 2020-10-13 DIAGNOSIS — E118 Type 2 diabetes mellitus with unspecified complications: Secondary | ICD-10-CM | POA: Diagnosis not present

## 2020-10-13 DIAGNOSIS — J302 Other seasonal allergic rhinitis: Secondary | ICD-10-CM

## 2020-10-13 DIAGNOSIS — M542 Cervicalgia: Secondary | ICD-10-CM

## 2020-10-13 DIAGNOSIS — G4733 Obstructive sleep apnea (adult) (pediatric): Secondary | ICD-10-CM

## 2020-10-13 DIAGNOSIS — I272 Pulmonary hypertension, unspecified: Secondary | ICD-10-CM

## 2020-10-13 DIAGNOSIS — J410 Simple chronic bronchitis: Secondary | ICD-10-CM

## 2020-10-13 DIAGNOSIS — I10 Essential (primary) hypertension: Secondary | ICD-10-CM | POA: Diagnosis not present

## 2020-10-13 DIAGNOSIS — R61 Generalized hyperhidrosis: Secondary | ICD-10-CM

## 2020-10-13 DIAGNOSIS — J3089 Other allergic rhinitis: Secondary | ICD-10-CM

## 2020-10-13 DIAGNOSIS — E1169 Type 2 diabetes mellitus with other specified complication: Secondary | ICD-10-CM

## 2020-10-13 DIAGNOSIS — K219 Gastro-esophageal reflux disease without esophagitis: Secondary | ICD-10-CM

## 2020-10-13 DIAGNOSIS — Z794 Long term (current) use of insulin: Secondary | ICD-10-CM | POA: Diagnosis not present

## 2020-10-13 DIAGNOSIS — E785 Hyperlipidemia, unspecified: Secondary | ICD-10-CM | POA: Diagnosis not present

## 2020-10-13 DIAGNOSIS — I152 Hypertension secondary to endocrine disorders: Secondary | ICD-10-CM

## 2020-10-13 DIAGNOSIS — I209 Angina pectoris, unspecified: Secondary | ICD-10-CM

## 2020-10-13 DIAGNOSIS — F325 Major depressive disorder, single episode, in full remission: Secondary | ICD-10-CM

## 2020-10-13 MED ORDER — PANTOPRAZOLE SODIUM 40 MG PO TBEC: 40.0000 mg | DELAYED_RELEASE_TABLET | Freq: Every morning | ORAL | 1 refills | Status: DC

## 2020-10-13 MED ORDER — MONTELUKAST SODIUM 10 MG PO TABS: 10.0000 mg | ORAL_TABLET | Freq: Every evening | ORAL | 1 refills | Status: DC

## 2020-10-13 MED ORDER — ALLOPURINOL 100 MG PO TABS: 100.0000 mg | ORAL_TABLET | Freq: Every day | ORAL | 1 refills | Status: DC

## 2020-10-13 MED ORDER — VENLAFAXINE HCL ER 75 MG PO CP24: ORAL_CAPSULE | ORAL | 1 refills | Status: DC

## 2020-10-13 MED ORDER — LOSARTAN POTASSIUM 100 MG PO TABS: 100.0000 mg | ORAL_TABLET | Freq: Every day | ORAL | 0 refills | Status: DC

## 2020-10-13 MED ORDER — ISOSORBIDE MONONITRATE ER 60 MG PO TB24: 60.0000 mg | ORAL_TABLET | Freq: Every day | ORAL | 1 refills | Status: DC

## 2020-10-13 MED ORDER — AMLODIPINE BESYLATE 2.5 MG PO TABS
2.5000 mg | ORAL_TABLET | Freq: Every day | ORAL | 1 refills | Status: DC
Start: 2020-10-13 — End: 2021-04-08

## 2020-10-13 MED ORDER — ROSUVASTATIN CALCIUM 20 MG PO TABS: 20.0000 mg | ORAL_TABLET | Freq: Every day | ORAL | 1 refills | Status: DC

## 2020-10-14 LAB — MICROALBUMIN / CREATININE URINE RATIO
Creatinine, Urine: 65 mg/dL (ref 20–275)
Microalb Creat Ratio: 15 mcg/mg creat (ref <30)
Microalb, Ur: 1 mg/dL

## 2020-10-20 DIAGNOSIS — I208 Other forms of angina pectoris: Secondary | ICD-10-CM | POA: Diagnosis not present

## 2020-10-20 DIAGNOSIS — R0602 Shortness of breath: Secondary | ICD-10-CM | POA: Diagnosis not present

## 2020-10-20 DIAGNOSIS — R0600 Dyspnea, unspecified: Secondary | ICD-10-CM | POA: Diagnosis not present

## 2020-11-10 DIAGNOSIS — G4733 Obstructive sleep apnea (adult) (pediatric): Secondary | ICD-10-CM | POA: Diagnosis not present

## 2020-11-10 DIAGNOSIS — I340 Nonrheumatic mitral (valve) insufficiency: Secondary | ICD-10-CM | POA: Diagnosis not present

## 2020-11-10 DIAGNOSIS — I25118 Atherosclerotic heart disease of native coronary artery with other forms of angina pectoris: Secondary | ICD-10-CM | POA: Diagnosis not present

## 2020-11-10 DIAGNOSIS — R0602 Shortness of breath: Secondary | ICD-10-CM | POA: Diagnosis not present

## 2020-11-10 DIAGNOSIS — E119 Type 2 diabetes mellitus without complications: Secondary | ICD-10-CM | POA: Diagnosis not present

## 2020-11-10 DIAGNOSIS — I70219 Atherosclerosis of native arteries of extremities with intermittent claudication, unspecified extremity: Secondary | ICD-10-CM | POA: Diagnosis not present

## 2020-11-10 DIAGNOSIS — E782 Mixed hyperlipidemia: Secondary | ICD-10-CM | POA: Diagnosis not present

## 2020-11-10 DIAGNOSIS — I10 Essential (primary) hypertension: Secondary | ICD-10-CM | POA: Diagnosis not present

## 2020-11-10 DIAGNOSIS — I700 Atherosclerosis of aorta: Secondary | ICD-10-CM | POA: Diagnosis not present

## 2020-11-11 ENCOUNTER — Telehealth: Payer: Self-pay

## 2020-11-11 NOTE — Progress Notes (Addendum)
Chronic Care Management Pharmacy Assistant   Name: Dana Sparks  MRN: [id number] DOB: June 09, 1951  Reason for Encounter:Diabeted Disease StateCall/ patient assistance questions for Anoro Ellipta  .   Conditions to be addressed/monitored: Diabetes  Primary concerns for visit include: Diabetes   Recent office visits:  10/13/2020 PCP Steele Sizer Stopped  lisinopril ,started  losartan 100 mg one tablet daily   Recent consult visits:  No recent [facility] visits:  None in previous 6 months  Medications: Outpatient Encounter Medications as of 11/11/2020  Medication Sig   ACCU-CHEK FASTCLIX LANCETS MISC    ACCU-CHEK SMARTVIEW test strip Check fsbs two times daily  DMII   Alcohol Swabs (B-D SINGLE USE SWABS REGULAR) PADS 1 each by Does not apply route 4 (four) times daily.   allopurinol (ZYLOPRIM) 100 MG tablet Take 1 tablet (100 mg total) by mouth daily.   amLODipine (NORVASC) 2.5 MG tablet Take 1 tablet (2.5 mg total) by mouth daily.   aspirin EC 81 MG tablet Take 81 mg by mouth every morning.    BD INSULIN SYRINGE U/F 31G X 5/16" 0.3 ML MISC    blood glucose meter kit and supplies KIT Dispense based on patient and insurance preference. Use up to four times daily as directed. (FOR ICD-9 250.00, 250.01).   celecoxib (CELEBREX) 200 MG capsule Take 1 capsule (200 mg total) by mouth 2 (two) times daily.   Cholecalciferol (VITAMIN D-3 PO) Take 1,000 Units by mouth daily.    CINNAMON PO Take 1,000 mg by mouth daily.   cloNIDine (CATAPRES) 0.1 MG tablet Take 1 tablet (0.1 mg total) by mouth every evening.   Coenzyme Q10 (CO Q-10) 100 MG CAPS TAKE 1 CAPSULE EVERY DAY   cyclobenzaprine (FLEXERIL) 5 MG tablet TAKE 1 TO 2 TABLETS AT BEDTIME AS NEEDED FOR MUSCLE SPASM(S).MAY INC TO 2 TABS PENDING RESPONSE.   DENTA 5000 PLUS 1.1 % CREA dental cream    Dulaglutide 1.5 MG/0.5ML SOPN Inject 1.5 mg into the skin once a week.   estradiol (ESTRACE) 0.5 MG tablet  Take 1 tablet (0.5 mg total) by mouth daily.   Ferrous Sulfate (IRON) 325 (65 Fe) MG TABS Take 325 mg by mouth daily. Nature made brand-   fluconazole (DIFLUCAN) 150 MG tablet Take 1 tablet (150 mg total) by mouth every other day. Prn yeast, max of 3 per flare   fluticasone (FLONASE) 50 MCG/ACT nasal spray PLACE 2 SPRAYS INTO BOTH NOSTRILS DAILY.   Insulin Glargine (BASAGLAR KWIKPEN) 100 UNIT/ML SOPN Inject 60 Units into the skin at bedtime.   Insulin Pen Needle (FIFTY50 PEN NEEDLES) 31G X 5 MM MISC Use once daily   ipratropium (ATROVENT) 0.03 % nasal spray Place 2 sprays into both nostrils every 12 (twelve) hours.   isosorbide mononitrate (IMDUR) 60 MG 24 hr tablet Take 1 tablet (60 mg total) by mouth daily.   Lancets 28G MISC Use 1 each 2 (two) times daily accu chek. Diagnosis: E11.22   loratadine (CLARITIN) 10 MG tablet Take 1 tablet (10 mg total) by mouth daily.   losartan (COZAAR) 100 MG tablet Take 1 tablet (100 mg total) by mouth daily. In place of lisinopril - has a cough   metoCLOPramide (REGLAN) 5 MG tablet TAKE 1 TO 2 TABLETS FOUR TIMES DAILY BEFORE MEALS  AND AT BEDTIME   metoprolol succinate (TOPROL-XL) 50 MG 24 hr tablet Take 1 tablet (50 mg total) by mouth daily.   montelukast (SINGULAIR) 10 MG tablet Take  1 tablet (10 mg total) by mouth every evening.   pantoprazole (PROTONIX) 40 MG tablet Take 1 tablet (40 mg total) by mouth every morning.   rosuvastatin (CRESTOR) 20 MG tablet Take 1 tablet (20 mg total) by mouth daily.   SODIUM FLUORIDE 5000 PPM 1.1 % PSTE    SYNJARDY XR 12.01-999 MG TB24 Take 2 tablets by mouth daily.   umeclidinium-vilanterol (ANORO ELLIPTA) 62.5-25 MCG/INH AEPB Inhale 1 puff into the lungs daily.   venlafaxine XR (EFFEXOR-XR) 75 MG 24 hr capsule TAKE 1 CAPSULE EVERY DAY   No facility-administered encounter medications on file as of 11/11/2020.     Star Rating Drugs:Losartan,rosuvastatin,  Recent Relevant Labs: Lab Results  Component  Value Date/Time   HGBA1C 6.4 09/29/2020 12:00 AM   HGBA1C 6.3 06/28/2020 12:00 AM   MICROALBUR 1.0 10/13/2020 09:28 AM   MICROALBUR 15.9 11/05/2019 12:00 AM   MICROALBUR 50 03/13/2016 09:38 AM   MICROALBUR 20 06/28/2015 09:10 AM    Kidney Function Lab Results  Component Value Date/Time   CREATININE 0.88 03/17/2019 08:54 AM   CREATININE 0.71 08/16/2018 08:19 AM   CREATININE 0.68 07/15/2018 12:28 PM   CREATININE 0.84 06/04/2018 09:14 AM   GFRNONAA 68 03/17/2019 08:54 AM   GFRAA 79 03/17/2019 08:54 AM     Current antihyperglycemic regimen:  o Trulicity 1.5 mg ,inject 1.5 mg into the skin once a week o  Synjardi 12.01-999 mg ,take two tablets daily  o Basaglar 60 units into the skin at bedtime    What recent interventions/DTPs have been made to improve glycemic control:  o Patient states she stop taking Glipizide XL 5 mg daily for a long time.  Have there been any recent hospitalizations or ED visits since last visit with CPP? No  Patient denies hypoglycemic symptoms, including Pale, Sweaty, Shaky, Hungry, Nervous/irritable and Vision changes  Patient denies hyperglycemic symptoms, including blurry vision, excessive thirst, fatigue, polyuria and weakness  How often are you checking your blood sugar? once daily  What are your blood sugars ranging?  o Fasting: N/A o Before meals:  o Patient states her blood sugar ranges around 70's,90 and 100. o After meals: N/A o Bedtime:  o Patient states her blood sugars ranges around 90's and 100.  During the week, how often does your blood glucose drop below 70? Never  Are you checking your feet daily/regularly?   Patient denies pain, numbness or tingling in her feet.  Patient is asking for a update for her inhaler Anoro Ellipta.Inform patient per note on [id number]: patients application was received but they are needing an OOP expense of medications sent and prescription to be refaxed with a cover letter. Patient states she is  unable to pay for it and it cost her around $100.00.Patient is asking if there is something else she can do.Notifed clinical pharmacist.   Adherence Review: Is the patient currently on a STATIN medication? Yes Is the patient currently on ACE/ARB medication? Yes Does the patient have >5 day gap between last estimated fill dates? No   [name] Clinical Production designer, theatre/television/film [phone]

## 2020-11-15 ENCOUNTER — Other Ambulatory Visit: Payer: Self-pay | Admitting: Family Medicine

## 2020-11-15 DIAGNOSIS — M62838 Other muscle spasm: Secondary | ICD-10-CM

## 2020-11-15 DIAGNOSIS — M542 Cervicalgia: Secondary | ICD-10-CM

## 2020-11-15 NOTE — Telephone Encounter (Signed)
Requested medication (s) are due for refill today: yes  Requested medication (s) are on the active medication list:  yes  Last refill:  10/06/2020  Future visit scheduled: Yes  Notes to clinic:  this refill cannot be delegated    Requested Prescriptions  Pending Prescriptions Disp Refills   cyclobenzaprine (FLEXERIL) 5 MG tablet [Pharmacy Med Name: CYCLOBENZAPRINE HYDROCHLORIDE 5 MG Tablet] 90 tablet 0    Sig: TAKE 1 TO 2 TABLETS AT BEDTIME AS NEEDED FOR MUSCLE SPASM(S). MAY INCREASE TO 2 TABS PENDING RESPONSE.      Not Delegated - Analgesics:  Muscle Relaxants Failed - 11/15/2020  4:43 AM      Failed - This refill cannot be delegated      Passed - Valid encounter within last 6 months    Recent Outpatient Visits           1 month ago Controlled type 2 diabetes mellitus with complication, with long-term current use of insulin [facility])   [facility] Steele Sizer, MD   2 months ago Vaginal itching   [facility] [place], Drue Stager, MD   4 months ago Controlled type 2 diabetes mellitus with complication, with long-term current use of insulin [facility])   [facility] Steele Sizer, MD   4 months ago Viral upper respiratory tract infection   [place], [place]   5 months ago Neck pain on left side   [facility] Steele Sizer, MD       Future Appointments             In 4 weeks  [facility], [place]   In 1 month Steele Sizer, MD [facility], [facility]

## 2020-11-24 ENCOUNTER — Other Ambulatory Visit: Payer: Self-pay | Admitting: Family Medicine

## 2020-11-24 DIAGNOSIS — J3089 Other allergic rhinitis: Secondary | ICD-10-CM

## 2020-11-24 NOTE — Telephone Encounter (Signed)
Requested Prescriptions  Pending Prescriptions Disp Refills  . celecoxib (CELEBREX) 200 MG capsule [Pharmacy Med Name: CELECOXIB 200 MG Capsule] 180 capsule 0    Sig: TAKE 1 CAPSULE TWICE DAILY     Analgesics:  COX2 Inhibitors Failed - 11/24/2020  4:42 AM      Failed - Cr in normal range and within 360 days    Creat  Date Value Ref Range Status  03/17/2019 0.88 0.50 - 0.99 mg/dL Final    Comment:    For patients >70 years of age, the reference limit for Creatinine is approximately 13% higher for people identified as African-American. .    Creatinine, Urine  Date Value Ref Range Status  10/13/2020 65 20 - 275 mg/dL Final         Passed - HGB in normal range and within 360 days    Hemoglobin  Date Value Ref Range Status  03/10/2020 13.9 11.7 - 15.5 g/dL Final  11/01/2015 12.8 11.1 - 15.9 g/dL Final         Passed - Patient is not pregnant      Passed - Valid encounter within last 12 months    Recent Outpatient Visits          1 month ago Controlled type 2 diabetes mellitus with complication, with long-term current use of insulin [facility])   [facility] Steele Sizer, MD   2 months ago Vaginal itching   [facility] [place], Drue Stager, MD   4 months ago Controlled type 2 diabetes mellitus with complication, with long-term current use of insulin [facility])   [facility] Steele Sizer, MD   4 months ago Viral upper respiratory tract infection   [place], MD   5 months ago Neck pain on left side   [facility] Steele Sizer, MD      Future Appointments            In 2 weeks  [facility], [place]   In 1 month Steele Sizer, MD [facility], [facility]

## 2020-11-30 ENCOUNTER — Telehealth: Payer: Self-pay

## 2020-11-30 ENCOUNTER — Ambulatory Visit: Payer: Self-pay | Admitting: *Deleted

## 2020-11-30 NOTE — Telephone Encounter (Signed)
Per agent:  "Pt called stating that she has a yeast infection. She states that she has been prescribed Diflucan, but is not sure how many she should be taking a day and if she should be taking any other medications with it. Please advise."  Reached pt, reviewed directions for Diflucan. States she took one Sat, Mon and will take one tomorrow. States "Not much better."  Unsure of rash, redness, itching remains "Really bad." Pt is using Ketoconazole cream as well "But I think it's over a year old." Pt questioning if her other medications are OK to take while on Diflucan. Advised to hold off on cream unitl hear from PCP.  Assured pt NT would route to practice for Dr. Ancil Boozer review.  CB# [id number] (cell)  Reason for Disposition . [1] Caller has NON-URGENT medicine question about med that PCP prescribed AND [2] triager unable to answer question  Answer Assessment - Initial Assessment Questions 1. NAME of MEDICATION: "What medicine are you calling about?"    Diflucan 2. QUESTION: "What is your question?" (e.g., medication refill, side effect)    Please see summary 3. PRESCRIBING HCP: "Who prescribed it?" Reason: if prescribed by specialist, call should be referred to that group.    PCP 4. SYMPTOMS: "Do you have any symptoms?" Itching   5. SEVERITY: If symptoms are present, ask "Are they mild, moderate or severe?"     "BAd"  Protocols used: MEDICATION QUESTION CALL-A-AH

## 2020-11-30 NOTE — Progress Notes (Signed)
Chronic Care Management Pharmacy Assistant   Name: Dana Sparks  MRN: [id number] DOB: 04-24-1951  Reason for Encounter: Medication Review/Adherence review    Medications: Outpatient Encounter Medications as of 11/30/2020  Medication Sig  . ACCU-CHEK FASTCLIX LANCETS MISC   . ACCU-CHEK SMARTVIEW test strip Check fsbs two times daily  DMII  . Alcohol Swabs (B-D SINGLE USE SWABS REGULAR) PADS 1 each by Does not apply route 4 (four) times daily.  Marland Kitchen allopurinol (ZYLOPRIM) 100 MG tablet Take 1 tablet (100 mg total) by mouth daily.  Marland Kitchen amLODipine (NORVASC) 2.5 MG tablet Take 1 tablet (2.5 mg total) by mouth daily.  Marland Kitchen aspirin EC 81 MG tablet Take 81 mg by mouth every morning.   . BD INSULIN SYRINGE U/F 31G X 5/16" 0.3 ML MISC   . blood glucose meter kit and supplies KIT Dispense based on patient and insurance preference. Use up to four times daily as directed. (FOR ICD-9 250.00, 250.01).  . celecoxib (CELEBREX) 200 MG capsule TAKE 1 CAPSULE TWICE DAILY  . Cholecalciferol (VITAMIN D-3 PO) Take 1,000 Units by mouth daily.   Marland Kitchen CINNAMON PO Take 1,000 mg by mouth daily.  . cloNIDine (CATAPRES) 0.1 MG tablet Take 1 tablet (0.1 mg total) by mouth every evening.  . Coenzyme Q10 (CO Q-10) 100 MG CAPS TAKE 1 CAPSULE EVERY DAY  . cyclobenzaprine (FLEXERIL) 5 MG tablet TAKE 1 TO 2 TABLETS AT BEDTIME AS NEEDED FOR MUSCLE SPASM(S). MAY INCREASE TO 2 TABS PENDING RESPONSE.  Marland Kitchen DENTA 5000 PLUS 1.1 % CREA dental cream   . Dulaglutide 1.5 MG/0.5ML SOPN Inject 1.5 mg into the skin once a week.  . estradiol (ESTRACE) 0.5 MG tablet Take 1 tablet (0.5 mg total) by mouth daily.  . Ferrous Sulfate (IRON) 325 (65 Fe) MG TABS Take 325 mg by mouth daily. Nature made brand-  . fluconazole (DIFLUCAN) 150 MG tablet Take 1 tablet (150 mg total) by mouth every other day. Prn yeast, max of 3 per flare  . fluticasone (FLONASE) 50 MCG/ACT nasal spray PLACE 2 SPRAYS INTO BOTH NOSTRILS DAILY.  Marland Kitchen Insulin Glargine (BASAGLAR  KWIKPEN) 100 UNIT/ML SOPN Inject 60 Units into the skin at bedtime.  . Insulin Pen Needle (FIFTY50 PEN NEEDLES) 31G X 5 MM MISC Use once daily  . ipratropium (ATROVENT) 0.03 % nasal spray Place 2 sprays into both nostrils every 12 (twelve) hours.  . isosorbide mononitrate (IMDUR) 60 MG 24 hr tablet Take 1 tablet (60 mg total) by mouth daily.  . Lancets 28G MISC Use 1 each 2 (two) times daily accu chek. Diagnosis: E11.22  . loratadine (CLARITIN) 10 MG tablet TAKE 1 TABLET EVERY DAY  . losartan (COZAAR) 100 MG tablet Take 1 tablet (100 mg total) by mouth daily. In place of lisinopril - has a cough  . metoCLOPramide (REGLAN) 5 MG tablet TAKE 1 TO 2 TABLETS FOUR TIMES DAILY BEFORE MEALS  AND AT BEDTIME  . metoprolol succinate (TOPROL-XL) 50 MG 24 hr tablet Take 1 tablet (50 mg total) by mouth daily.  . montelukast (SINGULAIR) 10 MG tablet Take 1 tablet (10 mg total) by mouth every evening.  . pantoprazole (PROTONIX) 40 MG tablet Take 1 tablet (40 mg total) by mouth every morning.  . rosuvastatin (CRESTOR) 20 MG tablet Take 1 tablet (20 mg total) by mouth daily.  . SODIUM FLUORIDE 5000 PPM 1.1 % PSTE   . SYNJARDY XR 12.01-999 MG TB24 Take 2 tablets by mouth daily.  Marland Kitchen umeclidinium-vilanterol [facility]  ELLIPTA) 62.5-25 MCG/INH AEPB Inhale 1 puff into the lungs daily.  Marland Kitchen venlafaxine XR (EFFEXOR-XR) 75 MG 24 hr capsule TAKE 1 CAPSULE EVERY DAY   No facility-administered encounter medications on file as of 11/30/2020.   Reviewed chart and adherence measures. Per insurance data patient is 80-89 % adherent to rosuvastatin, 90-99% adherent to lisinopril.   [place] Pharmacist Assistant [phone]

## 2020-12-01 NOTE — Telephone Encounter (Signed)
There are no more openings for this week

## 2020-12-02 DIAGNOSIS — E119 Type 2 diabetes mellitus without complications: Secondary | ICD-10-CM | POA: Diagnosis not present

## 2020-12-02 LAB — HM DIABETES EYE EXAM

## 2020-12-02 NOTE — Telephone Encounter (Signed)
lvm for pt to call back to get an appt for today

## 2020-12-07 ENCOUNTER — Other Ambulatory Visit: Payer: Self-pay | Admitting: Family Medicine

## 2020-12-07 DIAGNOSIS — J3089 Other allergic rhinitis: Secondary | ICD-10-CM

## 2020-12-08 ENCOUNTER — Other Ambulatory Visit: Payer: Self-pay | Admitting: Family Medicine

## 2020-12-08 ENCOUNTER — Other Ambulatory Visit: Payer: Self-pay

## 2020-12-08 DIAGNOSIS — J410 Simple chronic bronchitis: Secondary | ICD-10-CM

## 2020-12-08 MED ORDER — ANORO ELLIPTA 62.5-25 MCG/INH IN AEPB: 1.0000 | INHALATION_SPRAY | Freq: Every day | RESPIRATORY_TRACT | 1 refills | Status: DC

## 2020-12-08 MED ORDER — IPRATROPIUM BROMIDE 0.03 % NA SOLN
2.0000 | Freq: Two times a day (BID) | NASAL | 1 refills | Status: DC
Start: 2020-12-08 — End: 2022-08-11

## 2020-12-08 NOTE — Addendum Note (Signed)
Addended by: Steele Sizer F on: 12/08/2020 05:15 PM   Modules accepted: Orders

## 2020-12-08 NOTE — Telephone Encounter (Signed)
The class states Print on the status of the umeclidinium-vilanterol (ANORO ELLIPTA) 62.5-25 MCG/INH AEPB Refill / please resend Dana Sparks didn't receive it

## 2020-12-08 NOTE — Telephone Encounter (Signed)
Patient would like the nurse to call regarding her medication that was refused.  The patient stated she is all out and does not know why it was not approved.  Please advise and call patient at [phone]

## 2020-12-13 ENCOUNTER — Other Ambulatory Visit: Payer: Self-pay

## 2020-12-13 DIAGNOSIS — J3089 Other allergic rhinitis: Secondary | ICD-10-CM

## 2020-12-13 MED ORDER — FLUTICASONE-SALMETEROL 250-50 MCG/DOSE IN AEPB: 1.0000 | INHALATION_SPRAY | Freq: Two times a day (BID) | RESPIRATORY_TRACT | 0 refills | Status: DC

## 2020-12-14 ENCOUNTER — Ambulatory Visit (INDEPENDENT_AMBULATORY_CARE_PROVIDER_SITE_OTHER): Payer: Medicare HMO

## 2020-12-14 DIAGNOSIS — Z0000 Encounter for general adult medical examination without abnormal findings: Secondary | ICD-10-CM

## 2020-12-14 NOTE — Patient Instructions (Signed)
[name] , Thank you for taking time to come for your Medicare Wellness Visit. I appreciate your ongoing commitment to your health goals. Please review the following plan we discussed and let me know if I can assist you in the future.   Screening recommendations/referrals: Colonoscopy: done 09/02/18. Repeat in 2024 Mammogram: done 04/21/20 Bone Density: done 01/08/20 Recommended yearly ophthalmology/optometry visit for glaucoma screening and checkup Recommended yearly dental visit for hygiene and checkup  Vaccinations: Influenza vaccine: done 06/16/20 Pneumococcal vaccine: done 11/10/16 Tdap vaccine: done 03/10/20 Shingles vaccine: Shingrix discussed. Please contact your pharmacy for coverage information.  Covid-19: done 09/29/19, 10/27/19, 06/29/20 & 12/09/20  Advanced directives: Advance directive discussed with you today. I have provided a copy for you to complete at home and have notarized. Once this is complete please bring a copy in to our office so we can scan it into your chart.  Conditions/risks identified: Keep up the great work!  Next appointment: Follow up in one year for your annual wellness visit    Preventive Care 65 Years and Older, Female Preventive care refers to lifestyle choices and visits with your health care provider that can promote health and wellness. What does preventive care include?  A yearly physical exam. This is also called an annual well check.  Dental exams once or twice a year.  Routine eye exams. Ask your health care provider how often you should have your eyes checked.  Personal lifestyle choices, including:  Daily care of your teeth and gums.  Regular physical activity.  Eating a healthy diet.  Avoiding tobacco and drug use.  Limiting alcohol use.  Practicing safe sex.  Taking low-dose aspirin every day.  Taking vitamin and mineral supplements as recommended by your health care provider. What happens during an annual well check? The  services and screenings done by your health care provider during your annual well check will depend on your age, overall health, lifestyle risk factors, and family history of disease. Counseling  Your health care provider may ask you questions about your:  Alcohol use.  Tobacco use.  Drug use.  Emotional well-being.  Home and relationship well-being.  Sexual activity.  Eating habits.  History of falls.  Memory and ability to understand (cognition).  Work and work Statistician.  Reproductive health. Screening  You may have the following tests or measurements:  Height, weight, and BMI.  Blood pressure.  Lipid and cholesterol levels. These may be checked every 5 years, or more frequently if you are over 18 years old.  Skin check.  Lung cancer screening. You may have this screening every year starting at age 40 if you have a 30-pack-year history of smoking and currently smoke or have quit within the past 15 years.  Fecal occult blood test (FOBT) of the stool. You may have this test every year starting at age 23.  Flexible sigmoidoscopy or colonoscopy. You may have a sigmoidoscopy every 5 years or a colonoscopy every 10 years starting at age 82.  Hepatitis C blood test.  Hepatitis B blood test.  Sexually transmitted disease (STD) testing.  Diabetes screening. This is done by checking your blood sugar (glucose) after you have not eaten for a while (fasting). You may have this done every 1-3 years.  Bone density scan. This is done to screen for osteoporosis. You may have this done starting at age 23.  Mammogram. This may be done every 1-2 years. Talk to your health care provider about how often you should have regular  mammograms. Talk with your health care provider about your test results, treatment options, and if necessary, the need for more tests. Vaccines  Your health care provider may recommend certain vaccines, such as:  Influenza vaccine. This is recommended  every year.  Tetanus, diphtheria, and acellular pertussis (Tdap, Td) vaccine. You may need a Td booster every 10 years.  Zoster vaccine. You may need this after age 23.  Pneumococcal 13-valent conjugate (PCV13) vaccine. One dose is recommended after age 24.  Pneumococcal polysaccharide (PPSV23) vaccine. One dose is recommended after age 23. Talk to your health care provider about which screenings and vaccines you need and how often you need them. This information is not intended to replace advice given to you by your health care provider. Make sure you discuss any questions you have with your health care provider. Document Released: 09/17/2015 Document Revised: 05/10/2016 Document Reviewed: 06/22/2015 Elsevier Interactive Patient Education  2017 [employer] Prevention in the Home Falls can cause injuries. They can happen to people of all ages. There are many things you can do to make your home safe and to help prevent falls. What can I do on the outside of my home?  Regularly fix the edges of walkways and driveways and fix any cracks.  Remove anything that might make you trip as you walk through a door, such as a raised step or threshold.  Trim any bushes or trees on the path to your home.  Use bright outdoor lighting.  Clear any walking paths of anything that might make someone trip, such as rocks or tools.  Regularly check to see if handrails are loose or broken. Make sure that both sides of any steps have handrails.  Any raised decks and porches should have guardrails on the edges.  Have any leaves, snow, or ice cleared regularly.  Use sand or salt on walking paths during winter.  Clean up any spills in your garage right away. This includes oil or grease spills. What can I do in the bathroom?  Use night lights.  Install grab bars by the toilet and in the tub and shower. Do not use towel bars as grab bars.  Use non-skid mats or decals in the tub or shower.  If  you need to sit down in the shower, use a plastic, non-slip stool.  Keep the floor dry. Clean up any water that spills on the floor as soon as it happens.  Remove soap buildup in the tub or shower regularly.  Attach bath mats securely with double-sided non-slip rug tape.  Do not have throw rugs and other things on the floor that can make you trip. What can I do in the bedroom?  Use night lights.  Make sure that you have a light by your bed that is easy to reach.  Do not use any sheets or blankets that are too big for your bed. They should not hang down onto the floor.  Have a firm chair that has side arms. You can use this for support while you get dressed.  Do not have throw rugs and other things on the floor that can make you trip. What can I do in the kitchen?  Clean up any spills right away.  Avoid walking on wet floors.  Keep items that you use a lot in easy-to-reach places.  If you need to reach something above you, use a strong step stool that has a grab bar.  Keep electrical cords out of the way.  Do not use floor polish or wax that makes floors slippery. If you must use wax, use non-skid floor wax.  Do not have throw rugs and other things on the floor that can make you trip. What can I do with my stairs?  Do not leave any items on the stairs.  Make sure that there are handrails on both sides of the stairs and use them. Fix handrails that are broken or loose. Make sure that handrails are as long as the stairways.  Check any carpeting to make sure that it is firmly attached to the stairs. Fix any carpet that is loose or worn.  Avoid having throw rugs at the top or bottom of the stairs. If you do have throw rugs, attach them to the floor with carpet tape.  Make sure that you have a light switch at the top of the stairs and the bottom of the stairs. If you do not have them, ask someone to add them for you. What else can I do to help prevent falls?  Wear shoes  that:  Do not have high heels.  Have rubber bottoms.  Are comfortable and fit you well.  Are closed at the toe. Do not wear sandals.  If you use a stepladder:  Make sure that it is fully opened. Do not climb a closed stepladder.  Make sure that both sides of the stepladder are locked into place.  Ask someone to hold it for you, if possible.  Clearly mark and make sure that you can see:  Any grab bars or handrails.  First and last steps.  Where the edge of each step is.  Use tools that help you move around (mobility aids) if they are needed. These include:  Canes.  Walkers.  Scooters.  Crutches.  Turn on the lights when you go into a dark area. Replace any light bulbs as soon as they burn out.  Set up your furniture so you have a clear path. Avoid moving your furniture around.  If any of your floors are uneven, fix them.  If there are any pets around you, be aware of where they are.  Review your medicines with your doctor. Some medicines can make you feel dizzy. This can increase your chance of falling. Ask your doctor what other things that you can do to help prevent falls. This information is not intended to replace advice given to you by your health care provider. Make sure you discuss any questions you have with your health care provider. Document Released: 06/17/2009 Document Revised: 01/27/2016 Document Reviewed: 09/25/2014 Elsevier Interactive Patient Education  2017 [employer].

## 2020-12-14 NOTE — Progress Notes (Signed)
Subjective:   Dana Sparks is a 70 y.o. female who presents for Medicare Annual (Subsequent) preventive examination.  Virtual Visit via Telephone Note  I connected with  Dana Sparks on 12/14/20 at  8:40 AM EDT by telephone and verified that I am speaking with the correct person using two identifiers.  Location: Patient: home Provider: [place] Persons participating in the virtual visit: [place]   I discussed the limitations, risks, security and privacy concerns of performing an evaluation and management service by telephone and the availability of in person appointments. The patient expressed understanding and agreed to proceed.  Interactive audio and video telecommunications were attempted between this nurse and patient, however failed, due to patient having technical difficulties OR patient did not have access to video capability.  We continued and completed visit with audio only.  Some vital signs may be absent or patient reported.   [name], LPN    Review of Systems     Cardiac Risk Factors include: advanced age (>60mn, >>38women);diabetes mellitus;dyslipidemia;hypertension;obesity (BMI >30kg/m2)     Objective:    There were no vitals filed for this visit. There is no height or weight on file to calculate BMI.  Advanced Directives 12/14/2020 12/11/2019 03/24/2019 09/02/2018 08/16/2018 07/15/2018 01/04/2018  Does Patient Have a Medical Advance Directive? No No No No No No No  Type of Advance Directive - - - - - - -  Does patient want to make changes to medical advance directive? - - - - - - -  Copy of [place] Chart? - - - - - - -  Would patient like information on creating a medical advance directive? Yes (MAU/Ambulatory/Procedural Areas - Information given) Yes (MAU/Ambulatory/Procedural Areas - Information given) No - Patient declined No - Patient declined No - Patient declined No - Patient declined Yes (MAU/Ambulatory/Procedural  Areas - Information given)    Current Medications (verified) Outpatient Encounter Medications as of 12/14/2020  Medication Sig  . ACCU-CHEK FASTCLIX LANCETS MISC   . ACCU-CHEK SMARTVIEW test strip Check fsbs two times daily  DMII  . Alcohol Swabs (B-D SINGLE USE SWABS REGULAR) PADS 1 each by Does not apply route 4 (four) times daily.  .Marland Kitchenallopurinol (ZYLOPRIM) 100 MG tablet Take 1 tablet (100 mg total) by mouth daily.  .Marland KitchenamLODipine (NORVASC) 2.5 MG tablet Take 1 tablet (2.5 mg total) by mouth daily.  .Marland Kitchenaspirin EC 81 MG tablet Take 81 mg by mouth every morning.   . BD INSULIN SYRINGE U/F 31G X 5/16" 0.3 ML MISC   . blood glucose meter kit and supplies KIT Dispense based on patient and insurance preference. Use up to four times daily as directed. (FOR ICD-9 250.00, 250.01).  . celecoxib (CELEBREX) 200 MG capsule TAKE 1 CAPSULE TWICE DAILY  . Cholecalciferol (VITAMIN D-3 PO) Take 1,000 Units by mouth daily.   .Marland KitchenCINNAMON PO Take 1,000 mg by mouth daily.  . cloNIDine (CATAPRES) 0.1 MG tablet Take 1 tablet (0.1 mg total) by mouth every evening.  . Coenzyme Q10 (CO Q-10) 100 MG CAPS TAKE 1 CAPSULE EVERY DAY  . cyclobenzaprine (FLEXERIL) 5 MG tablet TAKE 1 TO 2 TABLETS AT BEDTIME AS NEEDED FOR MUSCLE SPASM(S). MAY INCREASE TO 2 TABS PENDING RESPONSE.  .Marland KitchenDENTA 5000 PLUS 1.1 % CREA dental cream   . Dulaglutide 1.5 MG/0.5ML SOPN Inject 1.5 mg into the skin once a week.  . estradiol (ESTRACE) 0.5 MG tablet Take 1 tablet (0.5 mg  total) by mouth daily.  . Ferrous Sulfate (IRON) 325 (65 Fe) MG TABS Take 325 mg by mouth daily. Nature made brand-  . fluticasone (FLONASE) 50 MCG/ACT nasal spray PLACE 2 SPRAYS INTO BOTH NOSTRILS DAILY.  Marland Kitchen Fluticasone-Salmeterol (ADVAIR) 250-50 MCG/DOSE AEPB Inhale 1 puff into the lungs 2 (two) times daily.  . Insulin Glargine (BASAGLAR KWIKPEN) 100 UNIT/ML SOPN Inject 60 Units into the skin at bedtime.  . Insulin Pen Needle (FIFTY50 PEN NEEDLES) 31G X 5 MM MISC Use once daily   . ipratropium (ATROVENT) 0.03 % nasal spray Place 2 sprays into both nostrils every 12 (twelve) hours.  . isosorbide mononitrate (IMDUR) 60 MG 24 hr tablet Take 1 tablet (60 mg total) by mouth daily.  Marland Kitchen loratadine (CLARITIN) 10 MG tablet TAKE 1 TABLET EVERY DAY  . losartan (COZAAR) 100 MG tablet Take 1 tablet (100 mg total) by mouth daily. In place of lisinopril - has a cough  . metoCLOPramide (REGLAN) 5 MG tablet TAKE 1 TO 2 TABLETS FOUR TIMES DAILY BEFORE MEALS  AND AT BEDTIME  . metoprolol succinate (TOPROL-XL) 50 MG 24 hr tablet Take 1 tablet (50 mg total) by mouth daily.  . montelukast (SINGULAIR) 10 MG tablet Take 1 tablet (10 mg total) by mouth every evening.  . pantoprazole (PROTONIX) 40 MG tablet Take 1 tablet (40 mg total) by mouth every morning.  . rosuvastatin (CRESTOR) 20 MG tablet Take 1 tablet (20 mg total) by mouth daily.  Marland Kitchen SYNJARDY XR 12.01-999 MG TB24 Take 2 tablets by mouth daily.  Marland Kitchen venlafaxine XR (EFFEXOR-XR) 75 MG 24 hr capsule TAKE 1 CAPSULE EVERY DAY  . [DISCONTINUED] fluconazole (DIFLUCAN) 150 MG tablet Take 1 tablet (150 mg total) by mouth every other day. Prn yeast, max of 3 per flare  . [DISCONTINUED] Lancets 28G MISC Use 1 each 2 (two) times daily accu chek. Diagnosis: E11.22  . [DISCONTINUED] SODIUM FLUORIDE 5000 PPM 1.1 % PSTE    No facility-administered encounter medications on file as of 12/14/2020.    Allergies (verified) Codeine, Contrast media [iodinated diagnostic agents], Lisinopril, and Sulfa antibiotics   History: Past Medical History:  Diagnosis Date  . Allergy   . Anxiety   . Arthritis    right knee  . CAD (coronary artery disease)   . COPD (chronic obstructive pulmonary disease) ([place])   . Depression   . Diabetes mellitus without complication ([place])    type 2  . Diverticulitis   . Gastroparesis   . GERD (gastroesophageal reflux disease)   . Gout   . Hyperlipemia   . Hypertension   . Positive H. pylori test   . Renal insufficiency   .  Sleep apnea    CPAP  . Thrombocytosis 01/28/2015  . Tubular adenoma of colon 01/20/14   Past Surgical History:  Procedure Laterality Date  . ABDOMINAL HYSTERECTOMY     total  . CATARACT EXTRACTION    . CATARACT EXTRACTION W/PHACO Left 03/24/2019   Procedure: CATARACT EXTRACTION PHACO AND INTRAOCULAR LENS PLACEMENT ([place])  LEFT DIABETIC;  Surgeon: Eulogio Bear, MD;  Location: [facility];  Service: Ophthalmology;  Laterality: Left;  Diabetic - insulin and oral meds sleep apnea  . COLONOSCOPY  01/2014   sigmoid diverticulosis. desc colon TA, hyperplastic polyp  . COLONOSCOPY WITH PROPOFOL N/A 09/02/2018   Procedure: COLONOSCOPY WITH PROPOFOL;  Surgeon: Lucilla Lame, MD;  Location: [facility];  Service: Endoscopy;  Laterality: N/A;  . CORONARY ANGIOPLASTY WITH STENT PLACEMENT    .  ESOPHAGOGASTRODUODENOSCOPY N/A 02/16/2015   negative h pylori, focal gastic intestinal metaplasia  . ESOPHAGOGASTRODUODENOSCOPY (EGD) WITH PROPOFOL N/A 09/02/2018   Procedure: ESOPHAGOGASTRODUODENOSCOPY (EGD) WITH BIOPSIES;  Surgeon: Lucilla Lame, MD;  Location: [facility];  Service: Endoscopy;  Laterality: N/A;  diabetic -insulin and oral meds sleep apnea  . TOTAL KNEE ARTHROPLASTY Right    Family History  Problem Relation Age of Onset  . Stroke Sister   . Hyperlipidemia Sister   . Alcohol abuse Brother   . Diabetes Brother   . Stroke Brother   . Hypertension Mother   . Diabetes Mother   . Heart disease Mother   . Diabetes Father   . Heart disease Father   . Hypertension Father   . Hypertension Sister   . Cancer Maternal Grandmother        Unsure  . Diabetes Maternal Grandfather   . Colon cancer Neg Hx   . Liver disease Neg Hx   . Breast cancer Neg Hx    Social History   Socioeconomic History  . Marital status: Married    Spouse name: Fritz Pickerel  . Number of children: 2  . Years of education: some college  . Highest education level: 12th grade  Occupational History   . Occupation: Disabled  Tobacco Use  . Smoking status: Former Smoker    Packs/day: 1.00    Years: 40.00    Pack years: 40.00    Types: Cigarettes    Start date: 09/04/1972    Quit date: 12/30/2012    Years since quitting: 7.9  . Smokeless tobacco: Never Used  . Tobacco comment: smoking cessation materials not required  Vaping Use  . Vaping Use: Never used  Substance and Sexual Activity  . Alcohol use: No    Alcohol/week: 0.0 standard drinks  . Drug use: No  . Sexual activity: Not Currently  Other Topics Concern  . Not on file  Social History Narrative   Married - current 48rd,    Two children from first marriage   He has 4 children from previous marriage   Social Determinants of Health   Financial Resource Strain: Low Risk   . Difficulty of Paying Living Expenses: Not very hard  Food Insecurity: No Food Insecurity  . Worried About Charity fundraiser in the Last Year: Never true  . Ran Out of Food in the Last Year: Never true  Transportation Needs: No Transportation Needs  . Lack of Transportation (Medical): No  . Lack of Transportation (Non-Medical): No  Physical Activity: Insufficiently Active  . Days of Exercise per Week: 2 days  . Minutes of Exercise per Session: 60 min  Stress: No Stress Concern Present  . Feeling of Stress : Not at all  Social Connections: Socially Integrated  . Frequency of Communication with Friends and Family: More than three times a week  . Frequency of Social Gatherings with Friends and Family: More than three times a week  . Attends Religious Services: More than 4 times per year  . Active Member of Clubs or Organizations: Yes  . Attends Archivist Meetings: More than 4 times per year  . Marital Status: Married    Tobacco Counseling Counseling given: Not Answered Comment: smoking cessation materials not required   Clinical Intake:  Pre-visit preparation completed: Yes  Pain : No/denies pain     Nutritional Status: BMI  > 30  Obese Nutritional Risks: None Diabetes: Yes CBG done?: No Did pt. bring in CBG monitor from home?:  No  How often do you need to have someone help you when you read instructions, pamphlets, or other written materials from your doctor or pharmacy?: 1 - Never  Nutrition Risk Assessment:  Has the patient had any N/V/D within the last 2 months?  No  Does the patient have any non-healing wounds?  No  Has the patient had any unintentional weight loss or weight gain?  No   Diabetes:  Is the patient diabetic?  Yes  If diabetic, was a CBG obtained today?  No  Did the patient bring in their glucometer from home?  No  How often do you monitor your CBG's? Twice daily per patient.   Financial Strains and Diabetes Management:  Are you having any financial strains with the device, your supplies or your medication? No .  Does the patient want to be seen by Chronic Care Management for management of their diabetes?  No  Would the patient like to be referred to a Nutritionist or for Diabetic Management?  No   Diabetic Exams:  Diabetic Eye Exam: Completed 12/2020 per patient; need records from [facility].   Diabetic Foot Exam: Completed 07/12/20.    Interpreter Needed?: No  Information entered by :: [name] LPN   Activities of Daily Living In your present state of health, do you have any difficulty performing the following activities: 12/14/2020 10/13/2020  Hearing? N N  Comment declines hearing aids -  Vision? N N  Difficulty concentrating or making decisions? N N  Walking or climbing stairs? N N  Dressing or bathing? N N  Doing errands, shopping? N N  Preparing Food and eating ? N -  Using the Toilet? N -  In the past six months, have you accidently leaked urine? N -  Do you have problems with loss of bowel control? N -  Managing your Medications? N -  Managing your Finances? N -  Housekeeping or managing your Housekeeping? N -  Some recent data might be hidden     Patient Care Team: Steele Sizer, MD as PCP - General (Family Medicine) Lucilla Lame, MD as Consulting Physician (Gastroenterology) Yolonda Kida, MD as Consulting Physician (Cardiology) Gabriel Carina Betsey Holiday, MD as Physician Assistant (Endocrinology) Hessie Knows, MD as Consulting Physician (Orthopedic Surgery) Lequita Asal, MD as Referring Physician (Hematology and Oncology) Germaine Pomfret, [facility] (Pharmacist) Sharlotte Alamo, DPM (Podiatry)  Indicate any recent Medical Services you may have received from other than [facility] providers in the past year (date may be approximate).     Assessment:   This is a routine wellness examination for [facility].  Hearing/Vision screen  Hearing Screening   '125Hz'  '250Hz'  '500Hz'  '1000Hz'  '2000Hz'  '3000Hz'  '4000Hz'  '6000Hz'  '8000Hz'   Right ear:           Left ear:           Comments: Pt denies hearing difficulty  Vision Screening Comments: Annual vision screenings done at [facility]  Dietary issues and exercise activities discussed: Current Exercise Habits: Home exercise routine, Type of exercise: treadmill;strength training/weights;calisthenics, Time (Minutes): 60, Frequency (Times/Week): 2, Weekly Exercise (Minutes/Week): 120, Intensity: Moderate, Exercise limited by: respiratory conditions(s)   Goals Addressed            This Visit's Progress   . DIET - INCREASE WATER INTAKE   On track    Recommend to drink at least 6-8 8oz glasses of water per day.        Depression Screen PHQ 2/9 Scores 12/14/2020  10/13/2020 09/14/2020 07/12/2020 06/30/2020 06/16/2020 05/17/2020  PHQ - 2 Score 0 0 0 0 0 0 0  PHQ- 9 Score - - - - - - -  Exception Documentation - - - - - - -    Fall Risk Fall Risk  12/14/2020 10/13/2020 09/14/2020 07/12/2020 06/30/2020  Falls in the past year? 0 0 0 0 0  Number falls in past yr: 0 0 0 0 0  Injury with Fall? 0 0 0 0 0  Comment - - - - -  Risk for fall due to : No Fall Risks - - - -  Follow up Falls prevention discussed - -  - -    FALL RISK PREVENTION PERTAINING TO THE HOME:  Any stairs in or around the home? Yes  If so, are there any without handrails? No  Home free of loose throw rugs in walkways, pet beds, electrical cords, etc? Yes  Adequate lighting in your home to reduce risk of falls? Yes   ASSISTIVE DEVICES UTILIZED TO PREVENT FALLS:  Life alert? No  Use of a cane, walker or w/c? No  Grab bars in the bathroom? No  Shower chair or bench in shower? No  Elevated toilet seat or a handicapped toilet? Yes   TIMED UP AND GO:  Was the test performed? No . Telephonic visit.   Cognitive Function: Normal cognitive status assessed by direct observation by this Nurse Health Advisor. No abnormalities found.     MMSE - Mini Mental State Exam 02/06/2019  Orientation to time 5  Orientation to Place 5  Registration 3  Attention/ Calculation 5  Recall 3  Language- name 2 objects 2  Language- repeat 1  Language- follow 3 step command 3  Language- read & follow direction 1  Write a sentence 1  Copy design 1  Total score 30     6CIT Screen 12/11/2019 01/15/2019 01/04/2018  What Year? 0 points 0 points 0 points  What month? 0 points 0 points 0 points  What time? 0 points 0 points 0 points  Count back from 20 0 points 0 points 0 points  Months in reverse 0 points 4 points 0 points  Repeat phrase 4 points 4 points 4 points  Total Score '4 8 4    ' Immunizations Immunization History  Administered Date(s) Administered  . Fluad Quad(high Dose 65+) 05/06/2019, 06/16/2020  . Influenza, High Dose Seasonal PF 05/23/2017, 06/04/2018  . Influenza,inj,Quad PF,6+ Mos 06/28/2015, 06/13/2016  . Influenza-Unspecified 06/28/2015, 06/13/2016  . Moderna SARS-COV2 Booster Vaccination 12/09/2020  . Moderna Sars-Covid-2 Vaccination 09/29/2019, 10/27/2019, 06/29/2020  . Pneumococcal Conjugate-13 07/21/2014  . Pneumococcal Polysaccharide-23 06/02/2009, 11/10/2016  . Tdap 12/30/2009, 03/10/2020  . Zoster 07/18/2012     TDAP status: Up to date  Flu Vaccine status: Up to date  Pneumococcal vaccine status: Up to date  Covid-19 vaccine status: Completed vaccines  Qualifies for Shingles Vaccine? Yes   Zostavax completed Yes   Shingrix Completed?: No.    Education has been provided regarding the importance of this vaccine. Patient has been advised to call insurance company to determine out of pocket expense if they have not yet received this vaccine. Advised may also receive vaccine at local pharmacy or Health Dept. Verbalized acceptance and understanding.  Screening Tests Health Maintenance  Topic Date Due  . OPHTHALMOLOGY EXAM  11/30/2020  . HEMOGLOBIN A1C  12/28/2020  . INFLUENZA VACCINE  04/04/2021  . MAMMOGRAM  04/21/2021  . FOOT EXAM  07/12/2021  . COLONOSCOPY (Pts  45-44yr Insurance coverage will need to be confirmed)  09/03/2023  . TETANUS/TDAP  03/10/2030  . DEXA SCAN  Completed  . COVID-19 Vaccine  Completed  . Hepatitis C Screening  Completed  . PNA vac Low Risk Adult  Completed  . HPV VACCINES  Aged Out    Health Maintenance  Health Maintenance Due  Topic Date Due  . OPHTHALMOLOGY EXAM  11/30/2020    Colorectal cancer screening: Type of screening: Colonoscopy. Completed 09/02/18. Repeat every 5 years  Mammogram status: Completed 04/21/20. Repeat every year  Bone Density status: Completed 01/08/20. Results reflect: Bone density results: NORMAL. Repeat every 2 years.  Lung Cancer Screening: (Low Dose CT Chest recommended if Age 70-80years, 30 pack-year currently smoking OR have quit w/in 15years.) does qualify. Completed 04/19/20.  Additional Screening:  Hepatitis C Screening: does qualify; Completed 01/20/13  Vision Screening: Recommended annual ophthalmology exams for early detection of glaucoma and other disorders of the eye. Is the patient up to date with their annual eye exam?  Yes  Who is the provider or what is the name of the office in which the patient attends annual  eye exams? [facility]   Dental Screening: Recommended annual dental exams for proper oral hygiene  Community Resource Referral / Chronic Care Management: CRR required this visit?  No   CCM required this visit?  No      Plan:     I have personally reviewed and noted the following in the patient's chart:   . Medical and social history . Use of alcohol, tobacco or illicit drugs  . Current medications and supplements . Functional ability and status . Nutritional status . Physical activity . Advanced directives . List of other physicians . Hospitalizations, surgeries, and ER visits in previous 12 months . Vitals . Screenings to include cognitive, depression, and falls . Referrals and appointments  In addition, I have reviewed and discussed with patient certain preventive protocols, quality metrics, and best practice recommendations. A written personalized care plan for preventive services as well as general preventive health recommendations were provided to patient.     KClemetine Marker LPN   [id number]  Nurse Notes: none

## 2020-12-16 ENCOUNTER — Other Ambulatory Visit: Payer: Self-pay | Admitting: Family Medicine

## 2020-12-16 DIAGNOSIS — E1143 Type 2 diabetes mellitus with diabetic autonomic (poly)neuropathy: Secondary | ICD-10-CM

## 2020-12-16 NOTE — Telephone Encounter (Signed)
Requested medication (s) are due for refill today: Yes  Requested medication (s) are on the active medication list: Yes  Last refill:  06/10/20  Future visit scheduled: Yes  Notes to clinic:  See request.    Requested Prescriptions  Pending Prescriptions Disp Refills   metoCLOPramide (REGLAN) 5 MG tablet [Pharmacy Med Name: METOCLOPRAMIDE HCL 5 MG Tablet] 120 tablet 0    Sig: TAKE 1 TO 2 TABLETS FOUR TIMES DAILY BEFORE MEALS  AND AT BEDTIME      Not Delegated - Gastroenterology: Antiemetics Failed - 12/16/2020  8:29 AM      Failed - This refill cannot be delegated      Passed - Valid encounter within last 6 months    Recent Outpatient Visits           2 months ago Controlled type 2 diabetes mellitus with complication, with long-term current use of insulin [facility])   [facility] Steele Sizer, MD   3 months ago Vaginal itching   [facility] [place], Drue Stager, MD   5 months ago Controlled type 2 diabetes mellitus with complication, with long-term current use of insulin [facility])   [facility] Steele Sizer, MD   5 months ago Viral upper respiratory tract infection   [facility] Lebron Conners D, MD   6 months ago Neck pain on left side   [facility] Steele Sizer, MD       Future Appointments             In 3 weeks Ancil Boozer, Drue Stager, MD [facility], [place]   In 12 months  [facility], [facility]

## 2020-12-21 ENCOUNTER — Other Ambulatory Visit: Payer: Self-pay

## 2020-12-21 MED ORDER — FLUTICASONE-SALMETEROL 250-50 MCG/DOSE IN AEPB: 1.0000 | INHALATION_SPRAY | Freq: Two times a day (BID) | RESPIRATORY_TRACT | 1 refills | Status: DC

## 2020-12-31 ENCOUNTER — Other Ambulatory Visit: Payer: Self-pay

## 2020-12-31 MED ORDER — LOSARTAN POTASSIUM 100 MG PO TABS: 100.0000 mg | ORAL_TABLET | Freq: Every day | ORAL | 0 refills | Status: DC

## 2021-01-04 DIAGNOSIS — L851 Acquired keratosis [keratoderma] palmaris et plantaris: Secondary | ICD-10-CM | POA: Diagnosis not present

## 2021-01-04 DIAGNOSIS — B351 Tinea unguium: Secondary | ICD-10-CM | POA: Diagnosis not present

## 2021-01-04 DIAGNOSIS — E1143 Type 2 diabetes mellitus with diabetic autonomic (poly)neuropathy: Secondary | ICD-10-CM | POA: Diagnosis not present

## 2021-01-07 NOTE — Progress Notes (Deleted)
Name: Dana Sparks   MRN: [id number]    DOB: [birth year]   Date:01/07/2021       Progress Note  Subjective  Chief Complaint  Follow Up  HPI  DMII with gastroparesis/CKI/neuropathy: she is taking medication as prescribed, seeing Dr. Gabriel Carina.Her hgbA1C has gone up from7.8% to 8.2%, 8.2%  6.8 % 6.3 % and now 6.4 % she is on Trulicity, Synjardi and Basaglar 60 units. . She is getting it through [employer] and has been compliant with mediation. She has gastroparesis controlled with reglan before meals, dyslipidemia on crestor, on ACE for microalbuminuria - she has a cough and we will switch to losartan  .Shehas intermittentpolyphagia, she denies  polyuria or polydipsia.Eye exam is up to date. She has vascular disease and intermittent claudication. She is taking aspirin daily. She states glucose fasting has been 80-112. Discussed considering going down on Basaglar and ask Dr. Gabriel Carina for glucagon rx.   HTN:she was on Norvasc 2.5 mg and Lisiinopril and Toprol xl, but bp was dropping , today bp is at goal, but we will try switching from lisinopril to losartan since she has a cough and also to try getting bp a little lower  Angina: CAD, her cardiologist is Dr. Clayborn Bigness, she has a stress test next week,  she skipped 2020 due to pandemic She is also on statin therapy, aspirin, beta blockerand ace and Imdur , angina under control with medical management . Shewas on disability for heart disease for years but in social security now. LastEcho from 2016 showed decreased in EF 25-30% and global hypokines. She has some SOB with activity, she does not have orthopnea, denies lower extremity edema . No chest pian or palpitation. Angina managed with medication at this time  OSA: shehas been wearing CPAP with humidifier at most once a week, discussed trying to increase to most days of the week. Explained that she has pulmonary hypertension and needs to try being compliant with machine   Major  Depression in remission  she is doing well on Effexor, phq 9 is normal today, no side effects of medication . She states feeling well for a while but wants to continue medication since it also helps with hot flashes   Obesity:she is trying to eat healthy,back to the gym on Trulicity and BMI is now below 35,weight is stable over the past year, up only 3 lbs   Hyperlipidemia: taking statin therapy,no muscle problems, reviewed last labs with patient , LDL 66, continue medication, denies myalgias , she has occasional cramps   Chronic bronchitis:  she used to smoke 1 pack daily for 50 years, quit smoking in 2014she has morning cough, phlegm is clear,   has some sob with moderate activity and occasional  wheezing.She has been using Anoro, no need for prn medication  PVD with claudication: on aspirin and statin therapy, she has not been walking lately and not sure how far she can go, she states have not been motivated to go the gym lately   Perennial Allergic Rhinitis: she is doing well today, uses humidifier at night , has nasal sprays at home   Neck pain: she states doing better, taking flexeril prn only now   Hot Flashes: she is on Effexor , Premarin, black cohosh,  and clonidine, a few years ago her symptoms were severe when she stopped Effexor   Patient Active Problem List   Diagnosis Date Noted  . Emphysema lung ([place]) 07/09/2020  . Hypercalcemia 07/05/2020  . Chronic bronchitis (  [place]) 02/22/2020  . Disease of stomach   . Hx of colonic polyps   . Hepatomegaly 07/23/2018  . Fatty liver 07/23/2018  . Aortic atherosclerosis ([place]) 07/09/2018  . Diabetes mellitus type 2, controlled, with complications ([place]) 15/17/6160  . Nasal polyp 09/03/2017  . Lipoma of back 09/03/2017  . Claudication ([place]) 06/04/2017  . LVH (left ventricular hypertrophy) 02/27/2017  . Decreased cardiac ejection fraction 02/27/2017  . Left hand weakness 10/12/2016  . Elevated antinuclear antibody (ANA) level  04/27/2016  . Angina pectoris ([place]) 11/01/2015  . Well controlled type 2 diabetes mellitus with gastroparesis ([place]) 06/22/2015  . Low TSH level 06/22/2015  . Iron deficiency anemia 04/07/2015  . Intestinal metaplasia of gastric mucosa 03/11/2015  . CAD (coronary artery disease), native coronary artery 02/24/2015  . History of coronary artery stent placement 02/24/2015  . Chronic kidney disease, stage 3, mod decreased GFR ([facility]) 02/24/2015  . Menopause 02/24/2015  . History of Helicobacter pylori infection 02/24/2015  . Mild mitral insufficiency 02/24/2015  . Mild tricuspid insufficiency 02/24/2015  . 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 Controlled gout 02/24/2015  . Depression, major, recurrent, mild ([place]) 02/24/2015  . Mild pulmonary hypertension ([place]) 02/24/2015  . Gastroesophageal reflux disease without esophagitis 02/24/2015  . Perennial allergic rhinitis 02/24/2015  . HLD (hyperlipidemia) 02/20/2015  . Hypertension, benign 02/20/2015  . Apnea, sleep 02/20/2015  . Controlled diabetes mellitus with stage 3 chronic kidney disease, without long-term current use of insulin ([place]) 02/20/2015    Past Surgical History:  Procedure Laterality Date  . ABDOMINAL HYSTERECTOMY     total  . CATARACT EXTRACTION    . CATARACT EXTRACTION W/PHACO Left 03/24/2019   Procedure: CATARACT EXTRACTION PHACO AND INTRAOCULAR LENS PLACEMENT ([place])  LEFT DIABETIC;  Surgeon: Eulogio Bear, MD;  Location: [facility];  Service: Ophthalmology;  Laterality: Left;  Diabetic - insulin and oral meds sleep apnea  . COLONOSCOPY  01/2014   sigmoid diverticulosis. desc colon TA, hyperplastic polyp  . COLONOSCOPY WITH PROPOFOL N/A 09/02/2018   Procedure: COLONOSCOPY WITH PROPOFOL;  Surgeon: Lucilla Lame, MD;  Location: [facility];  Service: Endoscopy;  Laterality: N/A;  . CORONARY ANGIOPLASTY WITH STENT PLACEMENT    . ESOPHAGOGASTRODUODENOSCOPY N/A  02/16/2015   negative h pylori, focal gastic intestinal metaplasia  . ESOPHAGOGASTRODUODENOSCOPY (EGD) WITH PROPOFOL N/A 09/02/2018   Procedure: ESOPHAGOGASTRODUODENOSCOPY (EGD) WITH BIOPSIES;  Surgeon: Lucilla Lame, MD;  Location: [facility];  Service: Endoscopy;  Laterality: N/A;  diabetic -insulin and oral meds sleep apnea  . TOTAL KNEE ARTHROPLASTY Right     Family History  Problem Relation Age of Onset  . Stroke Sister   . Hyperlipidemia Sister   . Alcohol abuse Brother   . Diabetes Brother   . Stroke Brother   . Hypertension Mother   . Diabetes Mother   . Heart disease Mother   . Diabetes Father   . Heart disease Father   . Hypertension Father   . Hypertension Sister   . Cancer Maternal Grandmother        Unsure  . Diabetes Maternal Grandfather   . Colon cancer Neg Hx   . Liver disease Neg Hx   . Breast cancer Neg Hx     Social History   Tobacco Use  . Smoking status: Former Smoker    Packs/day: 1.00    Years: 40.00    Pack years: 40.00    Types: Cigarettes    Start date:  09/04/1972    Quit date: 12/30/2012    Years since quitting: 8.0  . Smokeless tobacco: Never Used  . Tobacco comment: smoking cessation materials not required  Substance Use Topics  . Alcohol use: No    Alcohol/week: 0.0 standard drinks     Current Outpatient Medications:  .  metoCLOPramide (REGLAN) 5 MG tablet, TAKE 1 TO 2 TABLETS FOUR TIMES DAILY BEFORE MEALS  AND AT BEDTIME, Disp: 120 tablet, Rfl: 0 .  ACCU-CHEK FASTCLIX LANCETS MISC, , Disp: , Rfl:  .  ACCU-CHEK SMARTVIEW test strip, Check fsbs two times daily  DMII, Disp: 100 each, Rfl: 6 .  Alcohol Swabs (B-D SINGLE USE SWABS REGULAR) PADS, 1 each by Does not apply route 4 (four) times daily., Disp: 200 each, Rfl: 2 .  allopurinol (ZYLOPRIM) 100 MG tablet, Take 1 tablet (100 mg total) by mouth daily., Disp: 90 tablet, Rfl: 1 .  amLODipine (NORVASC) 2.5 MG tablet, Take 1 tablet (2.5 mg total) by mouth daily., Disp: 90 tablet,  Rfl: 1 .  aspirin EC 81 MG tablet, Take 81 mg by mouth every morning. , Disp: , Rfl:  .  BD INSULIN SYRINGE U/F 31G X 5/16" 0.3 ML MISC, , Disp: , Rfl:  .  blood glucose meter kit and supplies KIT, Dispense based on patient and insurance preference. Use up to four times daily as directed. (FOR ICD-9 250.00, 250.01)., Disp: 1 each, Rfl: 0 .  celecoxib (CELEBREX) 200 MG capsule, TAKE 1 CAPSULE TWICE DAILY, Disp: 180 capsule, Rfl: 0 .  Cholecalciferol (VITAMIN D-3 PO), Take 1,000 Units by mouth daily. , Disp: , Rfl:  .  CINNAMON PO, Take 1,000 mg by mouth daily., Disp: , Rfl:  .  cloNIDine (CATAPRES) 0.1 MG tablet, Take 1 tablet (0.1 mg total) by mouth every evening., Disp: 90 tablet, Rfl: 1 .  Coenzyme Q10 (CO Q-10) 100 MG CAPS, TAKE 1 CAPSULE EVERY DAY, Disp: 90 capsule, Rfl: 1 .  cyclobenzaprine (FLEXERIL) 5 MG tablet, TAKE 1 TO 2 TABLETS AT BEDTIME AS NEEDED FOR MUSCLE SPASM(S). MAY INCREASE TO 2 TABS PENDING RESPONSE., Disp: 90 tablet, Rfl: 0 .  DENTA 5000 PLUS 1.1 % CREA dental cream, , Disp: , Rfl:  .  Dulaglutide 1.5 MG/0.5ML SOPN, Inject 1.5 mg into the skin once a week., Disp: , Rfl:  .  estradiol (ESTRACE) 0.5 MG tablet, Take 1 tablet (0.5 mg total) by mouth daily., Disp: 90 tablet, Rfl: 1 .  Ferrous Sulfate (IRON) 325 (65 Fe) MG TABS, Take 325 mg by mouth daily. Nature made brand-, Disp: , Rfl:  .  fluticasone (FLONASE) 50 MCG/ACT nasal spray, PLACE 2 SPRAYS INTO BOTH NOSTRILS DAILY., Disp: 48 g, Rfl: 2 .  Fluticasone-Salmeterol (ADVAIR) 250-50 MCG/DOSE AEPB, Inhale 1 puff into the lungs 2 (two) times daily., Disp: 180 each, Rfl: 1 .  Insulin Glargine (BASAGLAR KWIKPEN) 100 UNIT/ML SOPN, Inject 60 Units into the skin at bedtime., Disp: , Rfl:  .  Insulin Pen Needle (FIFTY50 PEN NEEDLES) 31G X 5 MM MISC, Use once daily, Disp: , Rfl:  .  ipratropium (ATROVENT) 0.03 % nasal spray, Place 2 sprays into both nostrils every 12 (twelve) hours., Disp: 90 mL, Rfl: 1 .  isosorbide mononitrate  (IMDUR) 60 MG 24 hr tablet, Take 1 tablet (60 mg total) by mouth daily., Disp: 90 tablet, Rfl: 1 .  loratadine (CLARITIN) 10 MG tablet, TAKE 1 TABLET EVERY DAY, Disp: 90 tablet, Rfl: 1 .  losartan (COZAAR) 100 MG tablet, Take  1 tablet (100 mg total) by mouth daily. In place of lisinopril - has a cough, Disp: 90 tablet, Rfl: 0 .  metoprolol succinate (TOPROL-XL) 50 MG 24 hr tablet, Take 1 tablet (50 mg total) by mouth daily., Disp: 90 tablet, Rfl: 3 .  montelukast (SINGULAIR) 10 MG tablet, Take 1 tablet (10 mg total) by mouth every evening., Disp: 90 tablet, Rfl: 1 .  pantoprazole (PROTONIX) 40 MG tablet, Take 1 tablet (40 mg total) by mouth every morning., Disp: 90 tablet, Rfl: 1 .  rosuvastatin (CRESTOR) 20 MG tablet, Take 1 tablet (20 mg total) by mouth daily., Disp: 90 tablet, Rfl: 1 .  SYNJARDY XR 12.01-999 MG TB24, Take 2 tablets by mouth daily., Disp: , Rfl:  .  venlafaxine XR (EFFEXOR-XR) 75 MG 24 hr capsule, TAKE 1 CAPSULE EVERY DAY, Disp: 90 capsule, Rfl: 1  Allergies  Allergen Reactions  . Codeine Other (See Comments)    Unknown reaction  . Contrast Media [Iodinated Diagnostic Agents] Itching  . Lisinopril Cough  . Sulfa Antibiotics Itching    I personally reviewed {Reviewed:14835} with the patient/caregiver today.   ROS  ***  Objective  There were no vitals filed for this visit.  There is no height or weight on file to calculate BMI.  Physical Exam ***  Recent Results (from the past 2160 hour(s))  Urine Microalbumin w/creat. ratio     Status: None   Collection Time: 10/13/20  9:28 AM  Result Value Ref Range   Creatinine, Urine 65 20 - 275 mg/dL   Microalb, Ur 1.0 mg/dL    Comment: Reference Range Not established    Microalb Creat Ratio 15 <30 mcg/mg creat    Comment: . The ADA defines abnormalities in albumin excretion as follows: Marland Kitchen Albuminuria Category        Result (mcg/mg creatinine) . Normal to Mildly increased   <30 Moderately increased          30-299  Severely increased           > OR = 300 . The ADA recommends that at least two of three specimens collected within a 3-6 month period be abnormal before considering a patient to be within a diagnostic category.     Diabetic Foot Exam: Diabetic Foot Exam - Simple   No data filed    ***  PHQ2/9: Depression screen [facility] 2/9 12/14/2020 10/13/2020 09/14/2020 07/12/2020 06/30/2020  Decreased Interest 0 0 0 0 0  Down, Depressed, Hopeless 0 0 0 0 0  PHQ - 2 Score 0 0 0 0 0  Altered sleeping - - - - -  Tired, decreased energy - - - - -  Change in appetite - - - - -  Feeling bad or failure about yourself  - - - - -  Trouble concentrating - - - - -  Moving slowly or fidgety/restless - - - - -  Suicidal thoughts - - - - -  PHQ-9 Score - - - - -  Difficult doing work/chores - - - - -  Some recent data might be hidden    phq 9 is {gen pos [id number]} ***  Fall Risk: Fall Risk  12/14/2020 10/13/2020 09/14/2020 07/12/2020 06/30/2020  Falls in the past year? 0 0 0 0 0  Number falls in past yr: 0 0 0 0 0  Injury with Fall? 0 0 0 0 0  Comment - - - - -  Risk for fall due to : No Fall Risks - - - -  Follow up Falls prevention discussed - - - -   ***   Functional Status Survey:   ***   Assessment & Plan  *** There are no diagnoses linked to this encounter.

## 2021-01-09 ENCOUNTER — Other Ambulatory Visit: Payer: Self-pay | Admitting: Family Medicine

## 2021-01-10 ENCOUNTER — Ambulatory Visit: Payer: Medicare HMO | Admitting: Family Medicine

## 2021-01-10 DIAGNOSIS — Z794 Long term (current) use of insulin: Secondary | ICD-10-CM

## 2021-01-10 NOTE — Progress Notes (Signed)
Name: Dana Sparks   MRN: [id number]    DOB: [birth year]   Date:01/11/2021       Progress Note  Subjective  Chief Complaint  Follow Up  HPI  DMII with gastroparesis/CKI/neuropathy: she is taking medication as prescribed, seeing Dr. Gabriel Carina.Her hgbA1C was 7.8% to 8.2%, 8.2%  6.8 % 6.3 % and now 6.4 % she is on Trulicity, Synjardi and Basaglar 60 units. . She is getting it through [employer] and has been compliant with mediation. She has gastroparesis controlled with reglan before meals, dyslipidemia on crestor, on ARB for microalbuminuria,she is off ACE and cough resolved .Shehas intermittentpolyphagia, she denies  polyuria or polydipsia.Eye exam is up to date. She has vascular disease and intermittent claudication. She is taking aspirin daily. She states glucose fasting as low as 67 as high as 110's, advised to discuss with Endo going down on Basaglar   HTN:she was on Norvasc 2.5 mg , Losartan and Toprol xl, bp is low , advised to go down on losartan from 100 to 50 mg and return for bp check only with CMA   Angina: CAD, her cardiologist is Dr. Clayborn Bigness, she had a recent stress test and it was normal, she skipped 2020 due to pandemic She is also on statin therapy, aspirin, beta blockerand ace and Imdur , angina under control with medical management . Shewas on disability for heart disease for years but in social security now. LastEcho from 2016 showed decreased in EF 25-30% and global hypokines. She has some SOB with activity, she does not have orthopnea, denies lower extremity edema . Seldom has chest / breast pain on left side   FINDINGS: Myoview done 10/21/2020  EF 64 % Regional wall motion: reveals normal myocardial thickening and wall  motion.  The overall quality of the study is good.   Artifacts noted: no  Left ventricular cavity: normal.   Perfusion Analysis: SPECT images demonstrate homogeneous tracer  distribution throughout the myocardium. Defect type  : Normal   OSA: shehas been wearing CPAP every night now that she has a nasal device instead of full mask   Major Depression in remission  she is doing well on Effexor, phq 9 is normal today, no side effects of medication . She states feeling well for a while but wants to continue medication since it also helps with hot flashes Unchanged   Obesity:she is trying to eat healthy,back to the gym on Trulicity and BMI is now below 35,weight is stable over the past year, lost 3 lbs since last visit   Hyperlipidemia: taking statin therapy,no muscle problems, reviewed last labs with patient , LDL 66, continue medication, denies myalgias , she has occasional cramps . Unchanged   Chronic bronchitis:  she used to smoke 1 pack daily for 50 years, quit smoking in 2014she has has a productive in am but resolves after that, no wheezing or SOB .She was on Anoro but too expensive, now on Advair twice daily and doing welll   PVD with claudication/Atherosclerosis of aorta: on aspirin and statin therapy, she has not been walking lately and not sure how far she can go, advised her to go back to the gym  Hot  Flashes: she is on Effexor , Premarin, black cohosh,  and clonidine, a few years ago her symptoms were severe when she stopped Effexor. She needs refill of Clonidine    Patient Active Problem List   Diagnosis Date Noted  . Emphysema lung ([place]) 07/09/2020  . Hypercalcemia 07/05/2020  .  Chronic bronchitis ([place]) 02/22/2020  . Disease of stomach   . Hx of colonic polyps   . Hepatomegaly 07/23/2018  . Fatty liver 07/23/2018  . Aortic atherosclerosis ([place]) 07/09/2018  . Diabetes mellitus type 2, controlled, with complications ([place]) [id number]/63/[id number]  . Nasal polyp 09/03/2017  . Lipoma of back 09/03/2017  . Claudication ([place]) 06/04/2017  . LVH (left ventricular hypertrophy) 02/27/2017  . Decreased cardiac ejection fraction 02/27/2017  . Left hand weakness 10/12/2016  . Elevated antinuclear antibody  (ANA) level 04/27/2016  . Angina pectoris ([place]) 11/01/2015  . Well controlled type 2 diabetes mellitus with gastroparesis ([place]) 06/22/2015  . Low TSH level 06/22/2015  . Iron deficiency anemia 04/07/2015  . Intestinal metaplasia of gastric mucosa 03/11/2015  . CAD (coronary artery disease), native coronary artery 02/24/2015  . History of coronary artery stent placement 02/24/2015  . Chronic kidney disease, stage 3, mod decreased GFR (HCC) 02/24/2015  . Menopause 02/24/2015  . History of Helicobacter pylori infection 02/24/2015  . Mild mitral insufficiency 02/24/2015  . Mild tricuspid insufficiency 02/24/2015  . 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 ([place]) 02/24/2015  . Controlled gout 02/24/2015  . Depression, major, recurrent, mild ([place]) 02/24/2015  . Mild pulmonary hypertension ([place]) 02/24/2015  . Gastroesophageal reflux disease without esophagitis 02/24/2015  . Perennial allergic rhinitis 02/24/2015  . HLD (hyperlipidemia) 02/20/2015  . Hypertension, benign 02/20/2015  . Apnea, sleep 02/20/2015  . Controlled diabetes mellitus with stage 3 chronic kidney disease, without long-term current use of insulin ([place]) 02/20/2015    Past Surgical History:  Procedure Laterality Date  . ABDOMINAL HYSTERECTOMY     total  . CATARACT EXTRACTION    . CATARACT EXTRACTION W/PHACO Left 03/24/2019   Procedure: CATARACT EXTRACTION PHACO AND INTRAOCULAR LENS PLACEMENT ([place])  LEFT DIABETIC;  Surgeon: Eulogio Bear, MD;  Location: [facility];  Service: Ophthalmology;  Laterality: Left;  Diabetic - insulin and oral meds sleep apnea  . COLONOSCOPY  01/2014   sigmoid diverticulosis. desc colon TA, hyperplastic polyp  . COLONOSCOPY WITH PROPOFOL N/A 09/02/2018   Procedure: COLONOSCOPY WITH PROPOFOL;  Surgeon: Lucilla Lame, MD;  Location: [place] [facility];  Service: Endoscopy;  Laterality: N/A;  . CORONARY ANGIOPLASTY WITH STENT PLACEMENT    .  ESOPHAGOGASTRODUODENOSCOPY N/A 02/16/2015   negative h pylori, focal gastic intestinal metaplasia  . ESOPHAGOGASTRODUODENOSCOPY (EGD) WITH PROPOFOL N/A 09/02/2018   Procedure: ESOPHAGOGASTRODUODENOSCOPY (EGD) WITH BIOPSIES;  Surgeon: Lucilla Lame, MD;  Location: [place];  Service: Endoscopy;  Laterality: N/A;  diabetic -insulin and oral meds sleep apnea  . TOTAL KNEE ARTHROPLASTY Right     Family History  Problem Relation Age of Onset  . Stroke Sister   . Hyperlipidemia Sister   . Alcohol abuse Brother   . Diabetes Brother   . Stroke Brother   . Hypertension Mother   . Diabetes Mother   . Heart disease Mother   . Diabetes Father   . Heart disease Father   . Hypertension Father   . Hypertension Sister   . Cancer Maternal Grandmother        Unsure  . Diabetes Maternal Grandfather   . Colon cancer Neg Hx   . Liver disease Neg Hx   . Breast cancer Neg Hx     Social History   Tobacco Use  . Smoking status: Former Smoker    Packs/day: 1.00    Years: 40.00    Pack years: 40.00    Types: Cigarettes  Start date: 09/04/1972    Quit date: 12/30/2012    Years since quitting: 8.0  . Smokeless tobacco: Never Used  . Tobacco comment: smoking cessation materials not required  Substance Use Topics  . Alcohol use: No    Alcohol/week: 0.0 standard drinks     Current Outpatient Medications:  .  ACCU-CHEK FASTCLIX LANCETS MISC, , Disp: , Rfl:  .  ACCU-CHEK SMARTVIEW test strip, Check fsbs two times daily  DMII, Disp: 100 each, Rfl: 6 .  Alcohol Swabs (B-D SINGLE USE SWABS REGULAR) PADS, 1 each by Does not apply route 4 (four) times daily., Disp: 200 each, Rfl: 2 .  allopurinol (ZYLOPRIM) 100 MG tablet, Take 1 tablet (100 mg total) by mouth daily., Disp: 90 tablet, Rfl: 1 .  amLODipine (NORVASC) 2.5 MG tablet, Take 1 tablet (2.5 mg total) by mouth daily., Disp: 90 tablet, Rfl: 1 .  aspirin EC 81 MG tablet, Take 81 mg by mouth every morning. , Disp: , Rfl:  .  BD INSULIN  SYRINGE U/F 31G X 5/16" 0.3 ML MISC, , Disp: , Rfl:  .  blood glucose meter kit and supplies KIT, Dispense based on patient and insurance preference. Use up to four times daily as directed. (FOR ICD-9 250.00, 250.01)., Disp: 1 each, Rfl: 0 .  Cholecalciferol (VITAMIN D-3 PO), Take 1,000 Units by mouth daily. , Disp: , Rfl:  .  CINNAMON PO, Take 1,000 mg by mouth daily., Disp: , Rfl:  .  Coenzyme Q10 (CO Q-10) 100 MG CAPS, TAKE 1 CAPSULE EVERY DAY, Disp: 90 capsule, Rfl: 1 .  cyclobenzaprine (FLEXERIL) 5 MG tablet, TAKE 1 TO 2 TABLETS AT BEDTIME AS NEEDED FOR MUSCLE SPASM(S). MAY INCREASE TO 2 TABS PENDING RESPONSE., Disp: 90 tablet, Rfl: 0 .  DENTA 5000 PLUS 1.1 % CREA dental cream, , Disp: , Rfl:  .  Dulaglutide 1.5 MG/0.5ML SOPN, Inject 1.5 mg into the skin once a week., Disp: , Rfl:  .  estradiol (ESTRACE) 0.5 MG tablet, Take 1 tablet (0.5 mg total) by mouth daily., Disp: 90 tablet, Rfl: 1 .  Ferrous Sulfate (IRON) 325 (65 Fe) MG TABS, Take 325 mg by mouth daily. Nature made brand-, Disp: , Rfl:  .  fluticasone (FLONASE) 50 MCG/ACT nasal spray, PLACE 2 SPRAYS INTO BOTH NOSTRILS DAILY., Disp: 48 g, Rfl: 2 .  Fluticasone-Salmeterol (ADVAIR) 250-50 MCG/DOSE AEPB, Inhale 1 puff into the lungs 2 (two) times daily., Disp: 180 each, Rfl: 1 .  Insulin Glargine (BASAGLAR KWIKPEN) 100 UNIT/ML SOPN, Inject 60 Units into the skin at bedtime., Disp: , Rfl:  .  Insulin Pen Needle (FIFTY50 PEN NEEDLES) 31G X 5 MM MISC, Use once daily, Disp: , Rfl:  .  ipratropium (ATROVENT) 0.03 % nasal spray, Place 2 sprays into both nostrils every 12 (twelve) hours., Disp: 90 mL, Rfl: 1 .  isosorbide mononitrate (IMDUR) 60 MG 24 hr tablet, Take 1 tablet (60 mg total) by mouth daily., Disp: 90 tablet, Rfl: 1 .  loratadine (CLARITIN) 10 MG tablet, TAKE 1 TABLET EVERY DAY, Disp: 90 tablet, Rfl: 1 .  metoCLOPramide (REGLAN) 5 MG tablet, TAKE 1 TO 2 TABLETS FOUR TIMES DAILY BEFORE MEALS  AND AT BEDTIME, Disp: 120 tablet, Rfl: 0 .   metoprolol succinate (TOPROL-XL) 50 MG 24 hr tablet, Take 1 tablet (50 mg total) by mouth daily., Disp: 90 tablet, Rfl: 3 .  montelukast (SINGULAIR) 10 MG tablet, Take 1 tablet (10 mg total) by mouth every evening., Disp: 90 tablet, Rfl:  1 .  pantoprazole (PROTONIX) 40 MG tablet, Take 1 tablet (40 mg total) by mouth every morning., Disp: 90 tablet, Rfl: 1 .  rosuvastatin (CRESTOR) 20 MG tablet, Take 1 tablet (20 mg total) by mouth daily., Disp: 90 tablet, Rfl: 1 .  SYNJARDY XR 12.01-999 MG TB24, Take 2 tablets by mouth daily., Disp: , Rfl:  .  venlafaxine XR (EFFEXOR-XR) 75 MG 24 hr capsule, TAKE 1 CAPSULE EVERY DAY, Disp: 90 capsule, Rfl: 1 .  cloNIDine (CATAPRES) 0.1 MG tablet, Take 1 tablet (0.1 mg total) by mouth every evening., Disp: 90 tablet, Rfl: 1 .  losartan (COZAAR) 100 MG tablet, Take 0.5 tablets (50 mg total) by mouth daily. In place of lisinopril - has a cough, Disp: 90 tablet, Rfl: 0  Allergies  Allergen Reactions  . Codeine Other (See Comments)    Unknown reaction  . Contrast Media [Iodinated Diagnostic Agents] Itching  . Lisinopril Cough  . Sulfa Antibiotics Itching    I personally reviewed active problem list, medication list, allergies, family history, social history, health maintenance with the patient/caregiver today.   ROS  Constitutional: Negative for fever or weight change.  Respiratory: Negative for cough and shortness of breath.   Cardiovascular: positive  for intermittent chest pain but no  palpitations.  Gastrointestinal: Negative for abdominal pain, no bowel changes.  Musculoskeletal: Negative for gait problem or joint swelling.  Skin: Negative for rash.  Neurological: Negative for dizziness or headache.  No other specific complaints in a complete review of systems (except as listed in HPI above).  Objective  Vitals:   01/11/21 1030  BP: 100/62  Pulse: 88  Resp: 16  Temp: (!) 97.5 F (36.4 C)  SpO2: 97%  Weight: 216 lb 4.8 oz (98.1 kg)  Height:  '5\' 5"'  (1.651 m)    Body mass index is 35.99 kg/m.  Physical Exam  Constitutional: Patient appears well-developed and well-nourished. Obese  No distress.  HEENT: head atraumatic, normocephalic, pupils equal and reactive to light,  neck supple Cardiovascular: Normal rate, regular rhythm and normal heart sounds.  No murmur heard. No BLE edema. Pulmonary/Chest: Effort normal and breath sounds normal. No respiratory distress. Abdominal: Soft.  There is no tenderness. Psychiatric: Patient has a normal mood and affect. behavior is normal. Judgment and thought content normal.  PHQ2/9: Depression screen [facility] 2/9 01/11/2021 12/14/2020 10/13/2020 09/14/2020 07/12/2020  Decreased Interest 0 0 0 0 0  Down, Depressed, Hopeless 0 0 0 0 0  PHQ - 2 Score 0 0 0 0 0  Altered sleeping 0 - - - -  Tired, decreased energy 0 - - - -  Change in appetite 0 - - - -  Feeling bad or failure about yourself  0 - - - -  Trouble concentrating 0 - - - -  Moving slowly or fidgety/restless 0 - - - -  Suicidal thoughts 0 - - - -  PHQ-9 Score 0 - - - -  Difficult doing work/chores Not difficult at all - - - -  Some recent data might be hidden    phq 9 is negative   Fall Risk: Fall Risk  01/11/2021 12/14/2020 10/13/2020 09/14/2020 07/12/2020  Falls in the past year? 0 0 0 0 0  Number falls in past yr: 0 0 0 0 0  Injury with Fall? 0 0 0 0 0  Comment - - - - -  Risk for fall due to : - No Fall Risks - - -  Follow up -  Falls prevention discussed - - -     Functional Status Survey: Is the patient deaf or have difficulty hearing?: No Does the patient have difficulty seeing, even when wearing glasses/contacts?: No Does the patient have difficulty concentrating, remembering, or making decisions?: No Does the patient have difficulty walking or climbing stairs?: No Does the patient have difficulty dressing or bathing?: No Does the patient have difficulty doing errands alone such as visiting a doctor's office or shopping?:  No    Assessment & Plan  1. Controlled type 2 diabetes mellitus with complication, with long-term current use of insulin ([place])   2. Angina pectoris ([place])   3. Hypertension, benign  - losartan (COZAAR) 100 MG tablet; Take 0.5 tablets (50 mg total) by mouth daily. In place of lisinopril - has a cough  Dispense: 90 tablet; Refill: 0  4. Mild pulmonary hypertension ([place])   5. Dyslipidemia   6. Dyslipidemia associated with type 2 diabetes mellitus ([place])   7. Hypertension associated with diabetes ([place])  - losartan (COZAAR) 100 MG tablet; Take 0.5 tablets (50 mg total) by mouth daily. In place of lisinopril - has a cough  Dispense: 90 tablet; Refill: 0  8. Excessive sweating  - cloNIDine (CATAPRES) 0.1 MG tablet; Take 1 tablet (0.1 mg total) by mouth every evening.  Dispense: 90 tablet; Refill: 1  9. Major depression in remission ([place])   10. Claudication [facility])  She needs to exercise more   11. Obstructive sleep apnea syndrome   12. Atherosclerosis of aorta ([place])   13. Simple chronic bronchitis ([place])  On Advair now

## 2021-01-11 ENCOUNTER — Encounter: Payer: Self-pay | Admitting: Family Medicine

## 2021-01-11 ENCOUNTER — Ambulatory Visit (INDEPENDENT_AMBULATORY_CARE_PROVIDER_SITE_OTHER): Payer: Medicare HMO | Admitting: Family Medicine

## 2021-01-11 ENCOUNTER — Other Ambulatory Visit: Payer: Self-pay

## 2021-01-11 VITALS — BP 100/62 | HR 88 | Temp 97.5°F | Resp 16 | Ht 65.0 in | Wt 216.3 lb

## 2021-01-11 DIAGNOSIS — Z794 Long term (current) use of insulin: Secondary | ICD-10-CM

## 2021-01-11 DIAGNOSIS — E1159 Type 2 diabetes mellitus with other circulatory complications: Secondary | ICD-10-CM | POA: Diagnosis not present

## 2021-01-11 DIAGNOSIS — E785 Hyperlipidemia, unspecified: Secondary | ICD-10-CM | POA: Diagnosis not present

## 2021-01-11 DIAGNOSIS — I272 Pulmonary hypertension, unspecified: Secondary | ICD-10-CM

## 2021-01-11 DIAGNOSIS — F325 Major depressive disorder, single episode, in full remission: Secondary | ICD-10-CM | POA: Diagnosis not present

## 2021-01-11 DIAGNOSIS — R61 Generalized hyperhidrosis: Secondary | ICD-10-CM

## 2021-01-11 DIAGNOSIS — E1169 Type 2 diabetes mellitus with other specified complication: Secondary | ICD-10-CM | POA: Diagnosis not present

## 2021-01-11 DIAGNOSIS — I209 Angina pectoris, unspecified: Secondary | ICD-10-CM | POA: Diagnosis not present

## 2021-01-11 DIAGNOSIS — G4733 Obstructive sleep apnea (adult) (pediatric): Secondary | ICD-10-CM

## 2021-01-11 DIAGNOSIS — E118 Type 2 diabetes mellitus with unspecified complications: Secondary | ICD-10-CM | POA: Diagnosis not present

## 2021-01-11 DIAGNOSIS — I700 Atherosclerosis of aorta: Secondary | ICD-10-CM

## 2021-01-11 DIAGNOSIS — I10 Essential (primary) hypertension: Secondary | ICD-10-CM

## 2021-01-11 DIAGNOSIS — I739 Peripheral vascular disease, unspecified: Secondary | ICD-10-CM

## 2021-01-11 DIAGNOSIS — J410 Simple chronic bronchitis: Secondary | ICD-10-CM

## 2021-01-11 DIAGNOSIS — I152 Hypertension secondary to endocrine disorders: Secondary | ICD-10-CM

## 2021-01-11 MED ORDER — LOSARTAN POTASSIUM 100 MG PO TABS: 50.0000 mg | ORAL_TABLET | Freq: Every day | ORAL | 0 refills | Status: DC

## 2021-01-11 MED ORDER — CLONIDINE HCL 0.1 MG PO TABS: 0.1000 mg | ORAL_TABLET | Freq: Every evening | ORAL | 1 refills | Status: DC

## 2021-02-09 DIAGNOSIS — E1142 Type 2 diabetes mellitus with diabetic polyneuropathy: Secondary | ICD-10-CM | POA: Diagnosis not present

## 2021-02-09 LAB — HEMOGLOBIN A1C: Hemoglobin A1C: 6

## 2021-02-11 DIAGNOSIS — E669 Obesity, unspecified: Secondary | ICD-10-CM | POA: Diagnosis not present

## 2021-02-11 DIAGNOSIS — E1159 Type 2 diabetes mellitus with other circulatory complications: Secondary | ICD-10-CM | POA: Diagnosis not present

## 2021-02-11 DIAGNOSIS — E1142 Type 2 diabetes mellitus with diabetic polyneuropathy: Secondary | ICD-10-CM | POA: Diagnosis not present

## 2021-02-11 DIAGNOSIS — Z794 Long term (current) use of insulin: Secondary | ICD-10-CM | POA: Diagnosis not present

## 2021-02-11 DIAGNOSIS — E1169 Type 2 diabetes mellitus with other specified complication: Secondary | ICD-10-CM | POA: Diagnosis not present

## 2021-02-11 DIAGNOSIS — E785 Hyperlipidemia, unspecified: Secondary | ICD-10-CM | POA: Diagnosis not present

## 2021-03-09 ENCOUNTER — Other Ambulatory Visit: Payer: Self-pay | Admitting: Family Medicine

## 2021-03-09 DIAGNOSIS — R61 Generalized hyperhidrosis: Secondary | ICD-10-CM

## 2021-03-23 DIAGNOSIS — E1143 Type 2 diabetes mellitus with diabetic autonomic (poly)neuropathy: Secondary | ICD-10-CM | POA: Diagnosis not present

## 2021-03-23 DIAGNOSIS — B351 Tinea unguium: Secondary | ICD-10-CM | POA: Diagnosis not present

## 2021-04-07 ENCOUNTER — Other Ambulatory Visit: Payer: Self-pay | Admitting: Family Medicine

## 2021-04-07 DIAGNOSIS — N951 Menopausal and female climacteric states: Secondary | ICD-10-CM

## 2021-04-07 DIAGNOSIS — E1143 Type 2 diabetes mellitus with diabetic autonomic (poly)neuropathy: Secondary | ICD-10-CM

## 2021-04-07 DIAGNOSIS — I10 Essential (primary) hypertension: Secondary | ICD-10-CM

## 2021-04-12 NOTE — Progress Notes (Signed)
Name: Dana Sparks   MRN: [id number]    DOB: February 22, 1951   Date:04/13/2021       Progress Note  Subjective  Chief Complaint  Follow Up  HPI  Dysphagia/vomiting/LUQ pain: she states over the past few months she has noticed dysphagia, also vomits a few times a week, sometimes after a meal other times in the morning after she takes all her medicines. She has also noticed some LUQ pain, usually during palpation, described as dull ache. Denies fever or chills. She has noticed lack of appetite. Denies dysuria or hematuria. No change in bowel movements.   DM: sees Dr. Gabriel Carina, A1C has been at goal, takes Reglan for diabetic gastroparesis    HTN: she was on Norvasc 2.5 mg , Losartan and Toprol xl, and now on losartan 50 mg daily and bp is better today, continue current regiment    Obesity:she is trying to eat healthy, back to the gym on [employer] and BMI is now below 35,weight is stable over the past year, lost 3 lbs since last visit    Hyperlipidemia/Atherosclerosis of aorta: taking statin therapy, no muscle problems, reviewed last labs with patient , LDL 66 , continue medication, denies myalgias . We will recheck labs today     Patient Active Problem List   Diagnosis Date Noted   Emphysema lung ([place]) 07/09/2020   Hypercalcemia 07/05/2020   Chronic bronchitis ([place]) 02/22/2020   Disease of stomach    Hx of colonic polyps    Hepatomegaly 07/23/2018   Fatty liver 07/23/2018   Aortic atherosclerosis ([place]) 07/09/2018   Diabetes mellitus type 2, controlled, with complications ([place]) 29/47/6546   Nasal polyp 09/03/2017   Lipoma of back 09/03/2017   Claudication ([place]) 06/04/2017   LVH (left ventricular hypertrophy) 02/27/2017   Decreased cardiac ejection fraction 02/27/2017   Left hand weakness 10/12/2016   Elevated antinuclear antibody (ANA) level 04/27/2016   Angina pectoris ([place]) 11/01/2015   Well controlled type 2 diabetes mellitus with gastroparesis ([place]) 06/22/2015   Low TSH level  06/22/2015   Iron deficiency anemia 04/07/2015   Intestinal metaplasia of gastric mucosa 03/11/2015   CAD (coronary artery disease), native coronary artery 02/24/2015   History of coronary artery stent placement 02/24/2015   Chronic kidney disease, stage 3, mod decreased GFR ([place]) 02/24/2015   Menopause 50/35/4656   History of Helicobacter pylori infection 02/24/2015   Mild mitral insufficiency 02/24/2015   Mild tricuspid insufficiency 02/24/2015   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 ([place]) 02/24/2015   Controlled gout 02/24/2015   Depression, major, recurrent, mild ([place]) 02/24/2015   Mild pulmonary hypertension ([place]) 02/24/2015   Gastroesophageal reflux disease without esophagitis 02/24/2015   Perennial allergic rhinitis 02/24/2015   HLD (hyperlipidemia) 02/20/2015   Hypertension, benign 02/20/2015   Apnea, sleep 02/20/2015   Controlled diabetes mellitus with stage 3 chronic kidney disease, without long-term current use of insulin ([place]) 02/20/2015    Past Surgical History:  Procedure Laterality Date   ABDOMINAL HYSTERECTOMY     total   CATARACT EXTRACTION     CATARACT EXTRACTION W/PHACO Left 03/24/2019   Procedure: CATARACT EXTRACTION PHACO AND INTRAOCULAR LENS PLACEMENT ([place])  LEFT DIABETIC;  Surgeon: Eulogio Bear, MD;  Location: [facility];  Service: Ophthalmology;  Laterality: Left;  Diabetic - insulin and oral meds sleep apnea   COLONOSCOPY  01/2014   sigmoid diverticulosis. desc colon TA, hyperplastic polyp   COLONOSCOPY WITH PROPOFOL N/A 09/02/2018   Procedure: COLONOSCOPY WITH PROPOFOL;  Surgeon: Lucilla Lame, MD;  Location: [facility];  Service: Endoscopy;  Laterality: N/A;   CORONARY ANGIOPLASTY WITH STENT PLACEMENT     ESOPHAGOGASTRODUODENOSCOPY N/A 02/16/2015   negative h pylori, focal gastic intestinal metaplasia   ESOPHAGOGASTRODUODENOSCOPY (EGD) WITH PROPOFOL N/A 09/02/2018   Procedure: ESOPHAGOGASTRODUODENOSCOPY  (EGD) WITH BIOPSIES;  Surgeon: Lucilla Lame, MD;  Location: [facility];  Service: Endoscopy;  Laterality: N/A;  diabetic -insulin and oral meds sleep apnea   TOTAL KNEE ARTHROPLASTY Right     Family History  Problem Relation Age of Onset   Stroke Sister    Hyperlipidemia Sister    Alcohol abuse Brother    Diabetes Brother    Stroke Brother    Hypertension Mother    Diabetes Mother    Heart disease Mother    Diabetes Father    Heart disease Father    Hypertension Father    Hypertension Sister    Cancer Maternal Grandmother        Unsure   Diabetes Maternal Grandfather    Colon cancer Neg Hx    Liver disease Neg Hx    Breast cancer Neg Hx     Social History   Tobacco Use   Smoking status: Former    Packs/day: 1.00    Years: 40.00    Pack years: 40.00    Types: Cigarettes    Start date: 09/04/1972    Quit date: 12/30/2012    Years since quitting: 8.2   Smokeless tobacco: Never   Tobacco comments:    smoking cessation materials not required  Substance Use Topics   Alcohol use: No    Alcohol/week: 0.0 standard drinks     Current Outpatient Medications:    ACCU-CHEK FASTCLIX LANCETS MISC, , Disp: , Rfl:    ACCU-CHEK SMARTVIEW test strip, Check fsbs two times daily  DMII, Disp: 100 each, Rfl: 6   Alcohol Swabs (B-D SINGLE USE SWABS REGULAR) PADS, 1 each by Does not apply route 4 (four) times daily., Disp: 200 each, Rfl: 2   allopurinol (ZYLOPRIM) 100 MG tablet, Take 1 tablet (100 mg total) by mouth daily., Disp: 90 tablet, Rfl: 1   amLODipine (NORVASC) 2.5 MG tablet, TAKE 1 TABLET EVERY DAY, Disp: 90 tablet, Rfl: 1   aspirin EC 81 MG tablet, Take 81 mg by mouth every morning. , Disp: , Rfl:    BD INSULIN SYRINGE U/F 31G X 5/16" 0.3 ML MISC, , Disp: , Rfl:    blood glucose meter kit and supplies KIT, Dispense based on patient and insurance preference. Use up to four times daily as directed. (FOR ICD-9 250.00, 250.01)., Disp: 1 each, Rfl: 0   Cholecalciferol  (VITAMIN D-3 PO), Take 1,000 Units by mouth daily. , Disp: , Rfl:    CINNAMON PO, Take 1,000 mg by mouth daily., Disp: , Rfl:    cloNIDine (CATAPRES) 0.1 MG tablet, Take 1 tablet (0.1 mg total) by mouth every evening., Disp: 90 tablet, Rfl: 1   Coenzyme Q10 (CO Q-10) 100 MG CAPS, TAKE 1 CAPSULE EVERY DAY, Disp: 90 capsule, Rfl: 1   cyclobenzaprine (FLEXERIL) 5 MG tablet, TAKE 1 TO 2 TABLETS AT BEDTIME AS NEEDED FOR MUSCLE SPASM(S). MAY INCREASE TO 2 TABS PENDING RESPONSE., Disp: 90 tablet, Rfl: 0   DENTA 5000 PLUS 1.1 % CREA dental cream, , Disp: , Rfl:    Dulaglutide 1.5 MG/0.5ML SOPN, Inject 1.5 mg into the skin once a week., Disp: , Rfl:    estradiol (ESTRACE) 0.5 MG  tablet, TAKE 1 TABLET (0.5 MG TOTAL) BY MOUTH DAILY., Disp: 90 tablet, Rfl: 1   Ferrous Sulfate (IRON) 325 (65 Fe) MG TABS, Take 325 mg by mouth daily. Nature made brand-, Disp: , Rfl:    fluticasone (FLONASE) 50 MCG/ACT nasal spray, PLACE 2 SPRAYS INTO BOTH NOSTRILS DAILY., Disp: 48 g, Rfl: 2   Insulin Glargine (BASAGLAR KWIKPEN) 100 UNIT/ML SOPN, Inject 60 Units into the skin at bedtime., Disp: , Rfl:    Insulin Pen Needle (FIFTY50 PEN NEEDLES) 31G X 5 MM MISC, Use once daily, Disp: , Rfl:    ipratropium (ATROVENT) 0.03 % nasal spray, Place 2 sprays into both nostrils every 12 (twelve) hours., Disp: 90 mL, Rfl: 1   isosorbide mononitrate (IMDUR) 60 MG 24 hr tablet, Take 1 tablet (60 mg total) by mouth daily., Disp: 90 tablet, Rfl: 1   loratadine (CLARITIN) 10 MG tablet, TAKE 1 TABLET EVERY DAY, Disp: 90 tablet, Rfl: 1   losartan (COZAAR) 50 MG tablet, Take 1 tablet (50 mg total) by mouth daily., Disp: 90 tablet, Rfl: 1   metoCLOPramide (REGLAN) 5 MG tablet, TAKE 1 TO 2 TABLETS FOUR TIMES DAILY BEFORE MEALS  AND AT BEDTIME, Disp: 120 tablet, Rfl: 0   metoprolol succinate (TOPROL-XL) 50 MG 24 hr tablet, Take 1 tablet (50 mg total) by mouth daily., Disp: 90 tablet, Rfl: 3   montelukast (SINGULAIR) 10 MG tablet, Take 1 tablet (10 mg  total) by mouth every evening., Disp: 90 tablet, Rfl: 1   pantoprazole (PROTONIX) 40 MG tablet, Take 1 tablet (40 mg total) by mouth every morning., Disp: 90 tablet, Rfl: 1   rosuvastatin (CRESTOR) 20 MG tablet, Take 1 tablet (20 mg total) by mouth daily., Disp: 90 tablet, Rfl: 1   SYNJARDY XR 12.01-999 MG TB24, Take 2 tablets by mouth daily., Disp: , Rfl:    venlafaxine XR (EFFEXOR-XR) 75 MG 24 hr capsule, TAKE 1 CAPSULE EVERY DAY, Disp: 90 capsule, Rfl: 1   Fluticasone-Salmeterol (ADVAIR) 250-50 MCG/DOSE AEPB, Inhale 1 puff into the lungs 2 (two) times daily., Disp: 180 each, Rfl: 1  Allergies  Allergen Reactions   Codeine Other (See Comments)    Unknown reaction   Contrast Media [Iodinated Diagnostic Agents] Itching   Lisinopril Cough   Sulfa Antibiotics Itching    I personally reviewed active problem list, medication list, allergies, family history, social history, health maintenance with the patient/caregiver today.   ROS  Constitutional: Negative for fever or weight change.  Respiratory: Negative for cough and shortness of breath.   Cardiovascular: Negative for chest pain or palpitations.  Gastrointestinal: Positive for abdominal pain, but no bowel changes.  Musculoskeletal: Negative for gait problem or joint swelling.  Skin: Negative for rash.  Neurological: Negative for dizziness or headache.  No other specific complaints in a complete review of systems (except as listed in HPI above).   Objective  Vitals:   04/13/21 1056  BP: 112/70  Pulse: 84  Resp: 16  Temp: 97.6 F (36.4 C)  SpO2: 95%  Weight: 208 lb (94.3 kg)  Height: _0  (1.651 m)    Body mass index is 34.61 kg/m.  Physical Exam  Constitutional: Patient appears well-developed and well-nourished. Obese  No distress.  HEENT: head atraumatic, normocephalic, pupils equal and reactive to light, neck supple Cardiovascular: Normal rate, regular rhythm and normal heart sounds.  No murmur heard. No BLE  edema. Pulmonary/Chest: Effort normal and breath sounds normal. No respiratory distress. Abdominal: Soft.  There is epigastric, LUQ  tenderness, normal bowel sounds, negative CVA tenderness . Psychiatric: Patient has a normal mood and affect. behavior is normal. Judgment and thought content normal.   Recent Results (from the past 2160 hour(s))  Hemoglobin A1c     Status: None   Collection Time: 02/09/21 12:00 AM  Result Value Ref Range   Hemoglobin A1C 6.0      PHQ2/9: Depression screen [facility] 2/9 04/13/2021 01/11/2021 12/14/2020 10/13/2020 09/14/2020  Decreased Interest 2 0 0 0 0  Down, Depressed, Hopeless 0 0 0 0 0  PHQ - 2 Score 2 0 0 0 0  Altered sleeping 0 0 - - -  Tired, decreased energy 0 0 - - -  Change in appetite 2 0 - - -  Feeling bad or failure about yourself  0 0 - - -  Trouble concentrating 0 0 - - -  Moving slowly or fidgety/restless 0 0 - - -  Suicidal thoughts 0 0 - - -  PHQ-9 Score 4 0 - - -  Difficult doing work/chores - Not difficult at all - - -  Some recent data might be hidden    phq 9 is positive   Fall Risk: Fall Risk  04/13/2021 01/11/2021 12/14/2020 10/13/2020 09/14/2020  Falls in the past year? 0 0 0 0 0  Number falls in past yr: 0 0 0 0 0  Injury with Fall? 0 0 0 0 0  Comment - - - - -  Risk for fall due to : - - No Fall Risks - -  Follow up - - Falls prevention discussed - -      Functional Status Survey: Is the patient deaf or have difficulty hearing?: No Does the patient have difficulty seeing, even when wearing glasses/contacts?: No Does the patient have difficulty concentrating, remembering, or making decisions?: No Does the patient have difficulty walking or climbing stairs?: No Does the patient have difficulty dressing or bathing?: No Does the patient have difficulty doing errands alone such as visiting a doctor's office or shopping?: No    Assessment & Plan  1. Non-intractable vomiting with nausea, unspecified vomiting type  - SLP  modified barium swallow; Future - DG OP Swallowing Func-Medicare/Speech Path; Future - Lipase - Amylase - COMPLETE METABOLIC PANEL WITH GFR - CBC with Differential/Platelet  2. Abdominal pain, left upper quadrant  - SLP modified barium swallow; Future - DG OP Swallowing Func-Medicare/Speech Path; Future - Lipase - Amylase - COMPLETE METABOLIC PANEL WITH GFR - CBC with Differential/Platelet  3. Controlled type 2 diabetes mellitus with complication, with long-term current use of insulin (HCC)   4. Esophageal dysphagia  - SLP modified barium swallow; Future - DG OP Swallowing Func-Medicare/Speech Path; Future  5. Hypertension, benign  - COMPLETE METABOLIC PANEL WITH GFR - CBC with Differential/Platelet  6. Weight loss  - Lipase - Amylase - COMPLETE METABOLIC PANEL WITH GFR - CBC with Differential/Platelet  7. Atherosclerosis of abdominal aorta (HCC)  - Lipid panel

## 2021-04-13 ENCOUNTER — Other Ambulatory Visit: Payer: Self-pay

## 2021-04-13 ENCOUNTER — Ambulatory Visit (INDEPENDENT_AMBULATORY_CARE_PROVIDER_SITE_OTHER): Payer: Medicare HMO | Admitting: Family Medicine

## 2021-04-13 ENCOUNTER — Other Ambulatory Visit: Payer: Self-pay | Admitting: Family Medicine

## 2021-04-13 ENCOUNTER — Encounter: Payer: Self-pay | Admitting: Family Medicine

## 2021-04-13 VITALS — BP 112/70 | HR 84 | Temp 97.6°F | Resp 16 | Ht 65.0 in | Wt 208.0 lb

## 2021-04-13 DIAGNOSIS — I10 Essential (primary) hypertension: Secondary | ICD-10-CM | POA: Diagnosis not present

## 2021-04-13 DIAGNOSIS — R112 Nausea with vomiting, unspecified: Secondary | ICD-10-CM

## 2021-04-13 DIAGNOSIS — E118 Type 2 diabetes mellitus with unspecified complications: Secondary | ICD-10-CM | POA: Diagnosis not present

## 2021-04-13 DIAGNOSIS — Z794 Long term (current) use of insulin: Secondary | ICD-10-CM

## 2021-04-13 DIAGNOSIS — R1012 Left upper quadrant pain: Secondary | ICD-10-CM | POA: Diagnosis not present

## 2021-04-13 DIAGNOSIS — I700 Atherosclerosis of aorta: Secondary | ICD-10-CM | POA: Diagnosis not present

## 2021-04-13 DIAGNOSIS — R634 Abnormal weight loss: Secondary | ICD-10-CM | POA: Diagnosis not present

## 2021-04-13 DIAGNOSIS — R1319 Other dysphagia: Secondary | ICD-10-CM | POA: Diagnosis not present

## 2021-04-13 DIAGNOSIS — Z1231 Encounter for screening mammogram for malignant neoplasm of breast: Secondary | ICD-10-CM

## 2021-04-13 MED ORDER — LOSARTAN POTASSIUM 50 MG PO TABS: 50.0000 mg | ORAL_TABLET | Freq: Every day | ORAL | 1 refills | Status: DC

## 2021-04-14 LAB — CBC WITH DIFFERENTIAL/PLATELET
Absolute Monocytes: 628 cells/uL (ref 200–950)
Basophils Absolute: 44 cells/uL (ref 0–200)
Basophils Relative: 0.6 %
Eosinophils Absolute: 270 cells/uL (ref 15–500)
Eosinophils Relative: 3.7 %
HCT: 44.1 % (ref 35.0–45.0)
Hemoglobin: 14.4 g/dL (ref 11.7–15.5)
Lymphs Abs: 3475 cells/uL (ref 850–3900)
MCH: 30.3 pg (ref 27.0–33.0)
MCHC: 32.7 g/dL (ref 32.0–36.0)
MCV: 92.8 fL (ref 80.0–100.0)
MPV: 9.7 fL (ref 7.5–12.5)
Monocytes Relative: 8.6 %
Neutro Abs: 2884 cells/uL (ref 1500–7800)
Neutrophils Relative %: 39.5 %
Platelets: 283 10*3/uL (ref 140–400)
RBC: 4.75 10*6/uL (ref 3.80–5.10)
RDW: 12.6 % (ref 11.0–15.0)
Total Lymphocyte: 47.6 %
WBC: 7.3 10*3/uL (ref 3.8–10.8)

## 2021-04-14 LAB — COMPLETE METABOLIC PANEL WITHOUT GFR
AG Ratio: 1.9 (calc) (ref 1.0–2.5)
ALT: 10 U/L (ref 6–29)
AST: 14 U/L (ref 10–35)
Albumin: 4.5 g/dL (ref 3.6–5.1)
Alkaline phosphatase (APISO): 57 U/L (ref 37–153)
BUN: 13 mg/dL (ref 7–25)
CO2: 26 mmol/L (ref 20–32)
Calcium: 9.8 mg/dL (ref 8.6–10.4)
Chloride: 105 mmol/L (ref 98–110)
Creat: 0.85 mg/dL (ref 0.50–1.05)
Globulin: 2.4 g/dL (ref 1.9–3.7)
Glucose, Bld: 62 mg/dL — ABNORMAL LOW (ref 65–99)
Potassium: 4.4 mmol/L (ref 3.5–5.3)
Sodium: 140 mmol/L (ref 135–146)
Total Bilirubin: 0.3 mg/dL (ref 0.2–1.2)
Total Protein: 6.9 g/dL (ref 6.1–8.1)
eGFR: 74 mL/min/{1.73_m2}

## 2021-04-14 LAB — LIPID PANEL
Cholesterol: 116 mg/dL
HDL: 56 mg/dL
LDL Cholesterol (Calc): 43 mg/dL
Non-HDL Cholesterol (Calc): 60 mg/dL
Total CHOL/HDL Ratio: 2.1 (calc)
Triglycerides: 91 mg/dL

## 2021-04-14 LAB — AMYLASE: Amylase: 46 U/L (ref 21–101)

## 2021-04-14 LAB — LIPASE: Lipase: 50 U/L (ref 7–60)

## 2021-04-26 ENCOUNTER — Other Ambulatory Visit: Payer: Self-pay

## 2021-04-26 ENCOUNTER — Ambulatory Visit
Admission: RE | Admit: 2021-04-26 | Discharge: 2021-04-26 | Disposition: A | Discharge status: Home / Self Care | Payer: Medicare HMO | Source: Ambulatory Visit | Attending: Family Medicine | Admitting: Family Medicine

## 2021-04-26 DIAGNOSIS — Z1231 Encounter for screening mammogram for malignant neoplasm of breast: Secondary | ICD-10-CM | POA: Insufficient documentation

## 2021-05-09 ENCOUNTER — Other Ambulatory Visit: Payer: Self-pay | Admitting: Family Medicine

## 2021-05-10 ENCOUNTER — Other Ambulatory Visit: Payer: Self-pay | Admitting: Family Medicine

## 2021-05-10 NOTE — Telephone Encounter (Signed)
Medication:  WIXELA INHUB 250-50 MCG/ACT AEPB  Has the pt contacted their pharmacy? yes  [facility]erred [id number] [place], [place] she has been out of her inhaler for 3 days.  Now her mail order Rx will not be here for another 5 days.  She is requesting a 30 day supply be sent to local pharmacy.

## 2021-05-12 DIAGNOSIS — E119 Type 2 diabetes mellitus without complications: Secondary | ICD-10-CM | POA: Diagnosis not present

## 2021-05-12 DIAGNOSIS — G4733 Obstructive sleep apnea (adult) (pediatric): Secondary | ICD-10-CM | POA: Diagnosis not present

## 2021-05-12 DIAGNOSIS — R0602 Shortness of breath: Secondary | ICD-10-CM | POA: Diagnosis not present

## 2021-05-12 DIAGNOSIS — E782 Mixed hyperlipidemia: Secondary | ICD-10-CM | POA: Diagnosis not present

## 2021-05-12 DIAGNOSIS — I700 Atherosclerosis of aorta: Secondary | ICD-10-CM | POA: Diagnosis not present

## 2021-05-12 DIAGNOSIS — I2510 Atherosclerotic heart disease of native coronary artery without angina pectoris: Secondary | ICD-10-CM | POA: Diagnosis not present

## 2021-05-12 DIAGNOSIS — I70219 Atherosclerosis of native arteries of extremities with intermittent claudication, unspecified extremity: Secondary | ICD-10-CM | POA: Diagnosis not present

## 2021-05-12 DIAGNOSIS — I10 Essential (primary) hypertension: Secondary | ICD-10-CM | POA: Diagnosis not present

## 2021-05-12 DIAGNOSIS — I340 Nonrheumatic mitral (valve) insufficiency: Secondary | ICD-10-CM | POA: Diagnosis not present

## 2021-06-04 ENCOUNTER — Other Ambulatory Visit: Payer: Self-pay | Admitting: Family Medicine

## 2021-06-04 NOTE — Telephone Encounter (Signed)
Requested medication (s) are due for refill today: yes  Requested medication (s) are on the active medication list: yes  Last refill:  10/13/20 #90 1 RF  Future visit scheduled: yes  Notes to clinic:  overdue lab work   Requested Prescriptions  Pending Prescriptions Disp Refills   allopurinol (ZYLOPRIM) 100 MG tablet [Pharmacy Med Name: ALLOPURINOL 100 MG Tablet] 90 tablet 1    Sig: TAKE [place]     Endocrinology:  Gout Agents Failed - 06/04/2021 12:42 PM      Failed - Uric Acid in normal range and within 360 days    Uric Acid, Serum  Date Value Ref Range Status  02/27/2017 5.0 2.5 - 7.0 mg/dL Final          Passed - Cr in normal range and within 360 days    Creat  Date Value Ref Range Status  04/13/2021 0.85 0.50 - 1.05 mg/dL Final   Creatinine, Urine  Date Value Ref Range Status  10/13/2020 65 20 - 275 mg/dL Final          Passed - Valid encounter within last 12 months    Recent Outpatient Visits           1 month ago Non-intractable vomiting with nausea, unspecified vomiting type   [facility] Steele Sizer, MD   4 months ago Controlled type 2 diabetes mellitus with complication, with long-term current use of insulin [facility])   [facility] [place], Drue Stager, MD   7 months ago Controlled type 2 diabetes mellitus with complication, with long-term current use of insulin [facility])   [facility] Steele Sizer, MD   8 months ago Vaginal itching   [facility] [place], Drue Stager, MD   10 months ago Controlled type 2 diabetes mellitus with complication, with long-term current use of insulin [facility])   [facility] Steele Sizer, MD       Future Appointments             In 4 months Ancil Boozer, Drue Stager, MD [facility] [place], [place]   In 6 months  [facility]

## 2021-06-10 DIAGNOSIS — E1169 Type 2 diabetes mellitus with other specified complication: Secondary | ICD-10-CM | POA: Diagnosis not present

## 2021-06-10 DIAGNOSIS — E785 Hyperlipidemia, unspecified: Secondary | ICD-10-CM | POA: Diagnosis not present

## 2021-06-10 LAB — HEMOGLOBIN A1C: Hemoglobin A1C: 6.1

## 2021-06-13 DIAGNOSIS — E785 Hyperlipidemia, unspecified: Secondary | ICD-10-CM | POA: Diagnosis not present

## 2021-06-13 DIAGNOSIS — E1169 Type 2 diabetes mellitus with other specified complication: Secondary | ICD-10-CM | POA: Diagnosis not present

## 2021-06-13 DIAGNOSIS — E1159 Type 2 diabetes mellitus with other circulatory complications: Secondary | ICD-10-CM | POA: Diagnosis not present

## 2021-06-13 DIAGNOSIS — E669 Obesity, unspecified: Secondary | ICD-10-CM | POA: Diagnosis not present

## 2021-06-13 DIAGNOSIS — Z794 Long term (current) use of insulin: Secondary | ICD-10-CM | POA: Diagnosis not present

## 2021-06-13 DIAGNOSIS — E1142 Type 2 diabetes mellitus with diabetic polyneuropathy: Secondary | ICD-10-CM | POA: Diagnosis not present

## 2021-06-29 ENCOUNTER — Ambulatory Visit (INDEPENDENT_AMBULATORY_CARE_PROVIDER_SITE_OTHER): Payer: Medicare HMO | Admitting: Family Medicine

## 2021-06-29 ENCOUNTER — Other Ambulatory Visit: Payer: Self-pay

## 2021-06-29 ENCOUNTER — Encounter: Payer: Self-pay | Admitting: Family Medicine

## 2021-06-29 VITALS — BP 122/70 | HR 82 | Temp 97.9°F | Resp 16 | Ht 65.0 in | Wt 216.0 lb

## 2021-06-29 DIAGNOSIS — Z23 Encounter for immunization: Secondary | ICD-10-CM | POA: Diagnosis not present

## 2021-06-29 DIAGNOSIS — J410 Simple chronic bronchitis: Secondary | ICD-10-CM | POA: Diagnosis not present

## 2021-06-29 DIAGNOSIS — R0982 Postnasal drip: Secondary | ICD-10-CM

## 2021-06-29 DIAGNOSIS — R490 Dysphonia: Secondary | ICD-10-CM

## 2021-06-29 MED ORDER — UMECLIDINIUM-VILANTEROL 62.5-25 MCG/ACT IN AEPB: 1.0000 | INHALATION_SPRAY | Freq: Every day | RESPIRATORY_TRACT | 0 refills | Status: DC

## 2021-06-29 NOTE — Progress Notes (Signed)
Name: Dana Sparks   MRN: [id number]    DOB: 09/27/1950   Date:06/29/2021       Progress Note  Subjective  Chief Complaint  Hoarsness  HPI  Hoarseness: symptoms started gradually 3 weeks ago, it was not preceded by cold symptoms. She has stable morning cough, no headache, has stable nasal congestion, no headaches of facial pressure. Denies fever, chills or sick contacts. Shas COPD and is on Wyxella BID. She rinses her mouth when she uses medication.    Patient Active Problem List   Diagnosis Date Noted   Emphysema lung ([place]) 07/09/2020   Hypercalcemia 07/05/2020   Chronic bronchitis ([place]) 02/22/2020   Disease of stomach    Hx of colonic polyps    Hepatomegaly 07/23/2018   Fatty liver 07/23/2018   Aortic atherosclerosis ([place]) 07/09/2018   Diabetes mellitus type 2, controlled, with complications ([place]) 97/67/3419   Nasal polyp 09/03/2017   Lipoma of back 09/03/2017   Claudication ([place]) 06/04/2017   LVH (left ventricular hypertrophy) 02/27/2017   Decreased cardiac ejection fraction 02/27/2017   Left hand weakness 10/12/2016   Elevated antinuclear antibody (ANA) level 04/27/2016   Angina pectoris ([place]) 11/01/2015   Well controlled type 2 diabetes mellitus with gastroparesis ([place]) 06/22/2015   Low TSH level 06/22/2015   Iron deficiency anemia 04/07/2015   Intestinal metaplasia of gastric mucosa 03/11/2015   CAD (coronary artery disease), native coronary artery 02/24/2015   History of coronary artery stent placement 02/24/2015   Chronic kidney disease, stage 3, mod decreased GFR ([place]) 02/24/2015   Menopause 37/90/2409   History of Helicobacter pylori infection 02/24/2015   Mild mitral insufficiency 02/24/2015   Mild tricuspid insufficiency 02/24/2015   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 ([place]) 02/24/2015   Controlled gout 02/24/2015   Depression, major, recurrent, mild ([place]) 02/24/2015   Mild pulmonary hypertension ([place]) 02/24/2015    Gastroesophageal reflux disease without esophagitis 02/24/2015   Perennial allergic rhinitis 02/24/2015   HLD (hyperlipidemia) 02/20/2015   Hypertension, benign 02/20/2015   Apnea, sleep 02/20/2015   Controlled diabetes mellitus with stage 3 chronic kidney disease, without long-term current use of insulin ([place]) 02/20/2015    Past Surgical History:  Procedure Laterality Date   ABDOMINAL HYSTERECTOMY     total   CATARACT EXTRACTION     CATARACT EXTRACTION W/PHACO Left 03/24/2019   Procedure: CATARACT EXTRACTION PHACO AND INTRAOCULAR LENS PLACEMENT ([place])  LEFT DIABETIC;  Surgeon: Eulogio Bear, MD;  Location: [place] [place];  Service: Ophthalmology;  Laterality: Left;  Diabetic - insulin and oral meds sleep apnea   COLONOSCOPY  01/2014   sigmoid diverticulosis. desc colon TA, hyperplastic polyp   COLONOSCOPY WITH PROPOFOL N/A 09/02/2018   Procedure: COLONOSCOPY WITH PROPOFOL;  Surgeon: Lucilla Lame, MD;  Location: [facility];  Service: Endoscopy;  Laterality: N/A;   CORONARY ANGIOPLASTY WITH STENT PLACEMENT     ESOPHAGOGASTRODUODENOSCOPY N/A 02/16/2015   negative h pylori, focal gastic intestinal metaplasia   ESOPHAGOGASTRODUODENOSCOPY (EGD) WITH PROPOFOL N/A 09/02/2018   Procedure: ESOPHAGOGASTRODUODENOSCOPY (EGD) WITH BIOPSIES;  Surgeon: Lucilla Lame, MD;  Location: [place] [facility];  Service: Endoscopy;  Laterality: N/A;  diabetic -insulin and oral meds sleep apnea   TOTAL KNEE ARTHROPLASTY Right     Family History  Problem Relation Age of Onset   Stroke Sister    Hyperlipidemia Sister    Alcohol abuse Brother    Diabetes Brother    Stroke Brother    Hypertension Mother    Diabetes  Mother    Heart disease Mother    Diabetes Father    Heart disease Father    Hypertension Father    Hypertension Sister    Cancer Maternal Grandmother        Unsure   Diabetes Maternal Grandfather    Colon cancer Neg Hx    Liver disease Neg Hx    Breast cancer Neg Hx      Social History   Tobacco Use   Smoking status: Former    Packs/day: 1.00    Years: 40.00    Pack years: 40.00    Types: Cigarettes    Start date: 09/04/1972    Quit date: 12/30/2012    Years since quitting: 8.5   Smokeless tobacco: Never   Tobacco comments:    smoking cessation materials not required  Substance Use Topics   Alcohol use: No    Alcohol/week: 0.0 standard drinks     Current Outpatient Medications:    ACCU-CHEK FASTCLIX LANCETS MISC, , Disp: , Rfl:    ACCU-CHEK SMARTVIEW test strip, Check fsbs two times daily  DMII, Disp: 100 each, Rfl: 6   Alcohol Swabs (B-D SINGLE USE SWABS REGULAR) PADS, 1 each by Does not apply route 4 (four) times daily., Disp: 200 each, Rfl: 2   allopurinol (ZYLOPRIM) 100 MG tablet, TAKE 1 TABLET EVERY DAY, Disp: 90 tablet, Rfl: 1   amLODipine (NORVASC) 2.5 MG tablet, TAKE 1 TABLET EVERY DAY, Disp: 90 tablet, Rfl: 1   aspirin EC 81 MG tablet, Take 81 mg by mouth every morning. , Disp: , Rfl:    BD INSULIN SYRINGE U/F 31G X 5/16" 0.3 ML MISC, , Disp: , Rfl:    blood glucose meter kit and supplies KIT, Dispense based on patient and insurance preference. Use up to four times daily as directed. (FOR ICD-9 250.00, 250.01)., Disp: 1 each, Rfl: 0   Cholecalciferol (VITAMIN D-3 PO), Take 1,000 Units by mouth daily. , Disp: , Rfl:    CINNAMON PO, Take 1,000 mg by mouth daily., Disp: , Rfl:    cloNIDine (CATAPRES) 0.1 MG tablet, Take 1 tablet (0.1 mg total) by mouth every evening., Disp: 90 tablet, Rfl: 1   Coenzyme Q10 (CO Q-10) 100 MG CAPS, TAKE 1 CAPSULE EVERY DAY, Disp: 90 capsule, Rfl: 1   cyclobenzaprine (FLEXERIL) 5 MG tablet, TAKE 1 TO 2 TABLETS AT BEDTIME AS NEEDED FOR MUSCLE SPASM(S). MAY INCREASE TO 2 TABS PENDING RESPONSE., Disp: 90 tablet, Rfl: 0   DENTA 5000 PLUS 1.1 % CREA dental cream, , Disp: , Rfl:    Dulaglutide 1.5 MG/0.5ML SOPN, Inject 1.5 mg into the skin once a week., Disp: , Rfl:    estradiol (ESTRACE) 0.5 MG tablet, TAKE 1  TABLET (0.5 MG TOTAL) BY MOUTH DAILY., Disp: 90 tablet, Rfl: 1   Ferrous Sulfate (IRON) 325 (65 Fe) MG TABS, Take 325 mg by mouth daily. Nature made brand-, Disp: , Rfl:    fluticasone (FLONASE) 50 MCG/ACT nasal spray, PLACE 2 SPRAYS INTO BOTH NOSTRILS DAILY., Disp: 48 g, Rfl: 2   Insulin Glargine (BASAGLAR KWIKPEN) 100 UNIT/ML SOPN, Inject 60 Units into the skin at bedtime., Disp: , Rfl:    Insulin Pen Needle (FIFTY50 PEN NEEDLES) 31G X 5 MM MISC, Use once daily, Disp: , Rfl:    ipratropium (ATROVENT) 0.03 % nasal spray, Place 2 sprays into both nostrils every 12 (twelve) hours., Disp: 90 mL, Rfl: 1   isosorbide mononitrate (IMDUR) 60 MG 24 hr tablet, Take  1 tablet (60 mg total) by mouth daily., Disp: 90 tablet, Rfl: 1   loratadine (CLARITIN) 10 MG tablet, TAKE 1 TABLET EVERY DAY, Disp: 90 tablet, Rfl: 1   losartan (COZAAR) 50 MG tablet, Take 1 tablet (50 mg total) by mouth daily., Disp: 90 tablet, Rfl: 1   metoCLOPramide (REGLAN) 5 MG tablet, TAKE 1 TO 2 TABLETS FOUR TIMES DAILY BEFORE MEALS  AND AT BEDTIME, Disp: 120 tablet, Rfl: 0   metoprolol succinate (TOPROL-XL) 50 MG 24 hr tablet, Take 1 tablet (50 mg total) by mouth daily., Disp: 90 tablet, Rfl: 3   montelukast (SINGULAIR) 10 MG tablet, Take 1 tablet (10 mg total) by mouth every evening., Disp: 90 tablet, Rfl: 1   pantoprazole (PROTONIX) 40 MG tablet, Take 1 tablet (40 mg total) by mouth every morning., Disp: 90 tablet, Rfl: 1   rosuvastatin (CRESTOR) 20 MG tablet, Take 1 tablet (20 mg total) by mouth daily., Disp: 90 tablet, Rfl: 1   SYNJARDY XR 12.01-999 MG TB24, Take 2 tablets by mouth daily., Disp: , Rfl:    venlafaxine XR (EFFEXOR-XR) 75 MG 24 hr capsule, TAKE 1 CAPSULE EVERY DAY, Disp: 90 capsule, Rfl: 1   WIXELA INHUB 250-50 MCG/ACT AEPB, INHALE 1 PUFF INTO THE LUNGS TWICE A DAY, Disp: 60 each, Rfl: 5  Allergies  Allergen Reactions   Codeine Other (See Comments)    Unknown reaction   Contrast Media [Iodinated Diagnostic Agents]  Itching   Lisinopril Cough   Sulfa Antibiotics Itching    I personally reviewed active problem list, medication list, allergies, family history, social history, health maintenance with the patient/caregiver today.   ROS  Constitutional: Negative for fever or weight change.  Respiratory: positive  for cough but no  shortness of breath.   Cardiovascular: Negative for chest pain or palpitations.  Gastrointestinal: Negative for abdominal pain, no bowel changes.  Musculoskeletal: Negative for gait problem or joint swelling.  Skin: Negative for rash.  Neurological: Negative for dizziness or headache.  No other specific complaints in a complete review of systems (except as listed in HPI above).   Objective  Vitals:   06/29/21 0832  BP: 122/70  Pulse: 82  Resp: 16  Temp: 97.9 F (36.6 C)  SpO2: 95%  Weight: 216 lb (98 kg)  Height: '5\' 5"'  (1.651 m)    Body mass index is 35.94 kg/m.  Physical Exam  Constitutional: Patient appears well-developed and well-nourished. Obese  No distress.  HEENT: head atraumatic, normocephalic, pupils equal and reactive to light, neck supple, posterior pharynx showed white post-nasal drainage, tender during percussion of frontal and right maxillary sinus  Cardiovascular: Normal rate, regular rhythm and normal heart sounds.  No murmur heard. No BLE edema. Pulmonary/Chest: Effort normal and breath sounds normal. No respiratory distress. Abdominal: Soft.  There is no tenderness. Psychiatric: Patient has a normal mood and affect. behavior is normal. Judgment and thought content normal.   Recent Results (from the past 2160 hour(s))  Lipase     Status: None   Collection Time: 04/13/21 11:40 AM  Result Value Ref Range   Lipase 50 7 - 60 U/L  Amylase     Status: None   Collection Time: 04/13/21 11:40 AM  Result Value Ref Range   Amylase 46 21 - 101 U/L  Lipid panel     Status: None   Collection Time: 04/13/21 11:40 AM  Result Value Ref Range    Cholesterol 116 <200 mg/dL   HDL 56 > OR = 50  mg/dL   Triglycerides 91 <150 mg/dL   LDL Cholesterol (Calc) 43 mg/dL (calc)    Comment: Reference range: <100 . Desirable range <100 mg/dL for primary prevention;   <70 mg/dL for patients with CHD or diabetic patients  with > or = 2 CHD risk factors. Marland Kitchen LDL-C is now calculated using the Martin-Hopkins  calculation, which is a validated novel method providing  better accuracy than the Friedewald equation in the  estimation of LDL-C.  Cresenciano Genre et al. Annamaria Helling. 6378;588(50): 2061-2068  (http://education.[employer].com/faq/FAQ164)    Total CHOL/HDL Ratio 2.1 <5.0 (calc)   Non-HDL Cholesterol (Calc) 60 <130 mg/dL (calc)    Comment: For patients with diabetes plus 1 major ASCVD risk  factor, treating to a non-HDL-C goal of <100 mg/dL  (LDL-C of <70 mg/dL) is considered a therapeutic  option.   COMPLETE METABOLIC PANEL WITH GFR     Status: Abnormal   Collection Time: 04/13/21 11:40 AM  Result Value Ref Range   Glucose, Bld 62 (L) 65 - 99 mg/dL    Comment: .            Fasting reference interval .    BUN 13 7 - 25 mg/dL   Creat 0.85 0.50 - 1.05 mg/dL   eGFR 74 > OR = 60 mL/min/1.58m    Comment: The eGFR is based on the CKD-EPI 2021 equation. To calculate  the new eGFR from a previous Creatinine or Cystatin C result, go to https://www.kidney.org/professionals/ kdoqi/gfr%5Fcalculator    BUN/Creatinine Ratio NOT APPLICABLE 6 - 22 (calc)   Sodium 140 135 - 146 mmol/L   Potassium 4.4 3.5 - 5.3 mmol/L   Chloride 105 98 - 110 mmol/L   CO2 26 20 - 32 mmol/L   Calcium 9.8 8.6 - 10.4 mg/dL   Total Protein 6.9 6.1 - 8.1 g/dL   Albumin 4.5 3.6 - 5.1 g/dL   Globulin 2.4 1.9 - 3.7 g/dL (calc)   AG Ratio 1.9 1.0 - 2.5 (calc)   Total Bilirubin 0.3 0.2 - 1.2 mg/dL   Alkaline phosphatase (APISO) 57 37 - 153 U/L   AST 14 10 - 35 U/L   ALT 10 6 - 29 U/L  CBC with Differential/Platelet     Status: None   Collection Time: 04/13/21 11:40 AM   Result Value Ref Range   WBC 7.3 3.8 - 10.8 Thousand/uL   RBC 4.75 3.80 - 5.10 Million/uL   Hemoglobin 14.4 11.7 - 15.5 g/dL   HCT 44.1 35.0 - 45.0 %   MCV 92.8 80.0 - 100.0 fL   MCH 30.3 27.0 - 33.0 pg   MCHC 32.7 32.0 - 36.0 g/dL   RDW 12.6 11.0 - 15.0 %   Platelets 283 140 - 400 Thousand/uL   MPV 9.7 7.5 - 12.5 fL   Neutro Abs 2,884 1,500 - 7,800 cells/uL   Lymphs Abs 3,475 850 - 3,900 cells/uL   Absolute Monocytes 628 200 - 950 cells/uL   Eosinophils Absolute 270 15 - 500 cells/uL   Basophils Absolute 44 0 - 200 cells/uL   Neutrophils Relative % 39.5 %   Total Lymphocyte 47.6 %   Monocytes Relative 8.6 %   Eosinophils Relative 3.7 %   Basophils Relative 0.6 %  Hemoglobin A1c     Status: None   Collection Time: 06/10/21 12:00 AM  Result Value Ref Range   Hemoglobin A1C 6.1       PHQ2/9: [facility] 06/29/2021 04/13/2021 01/11/2021 12/14/2020 10/13/2020  Decreased Interest 0  2 0 0 0  Down, Depressed, Hopeless 0 0 0 0 0  PHQ - 2 Score 0 2 0 0 0  Altered sleeping - 0 0 - -  Tired, decreased energy - 0 0 - -  Change in appetite - 2 0 - -  Feeling bad or failure about yourself  - 0 0 - -  Trouble concentrating - 0 0 - -  Moving slowly or fidgety/restless - 0 0 - -  Suicidal thoughts - 0 0 - -  PHQ-9 Score - 4 0 - -  Difficult doing work/chores - - Not difficult at all - -  Some recent data might be hidden    phq 9 is negative   Fall Risk: Fall Risk  06/29/2021 04/13/2021 01/11/2021 12/14/2020 10/13/2020  Falls in the past year? 0 0 0 0 0  Number falls in past yr: 0 0 0 0 0  Injury with Fall? 0 0 0 0 0  Comment - - - - -  Risk for fall due to : No Fall Risks - - No Fall Risks -  Follow up Falls prevention discussed - - Falls prevention discussed -      Functional Status Survey: Is the patient deaf or have difficulty hearing?: No Does the patient have difficulty seeing, even when wearing glasses/contacts?: No Does the patient have difficulty  concentrating, remembering, or making decisions?: No Does the patient have difficulty walking or climbing stairs?: No Does the patient have difficulty dressing or bathing?: No Does the patient have difficulty doing errands alone such as visiting a doctor's office or shopping?: No    Assessment & Plan  1. Hoarseness  It may be secondary to inhaled steroids   2. Simple chronic bronchitis (HCC)  - umeclidinium-vilanterol (ANORO ELLIPTA) 62.5-25 MCG/ACT AEPB; Inhale 1 puff into the lungs daily at 6 (six) AM. In place of Advair/Wyxela for the next two weeks to see if hoarseness resolves  Dispense: 1 each; Refill: 0  3. Post-nasal drainage  Some pain during percussion of frontal and right maxillary sinus, we will try switching from Wyxella to Anoro ( sample for two weeks) and if no improvement we will give her antibiotics and refer to ENT for evaluation of hoarseness   4. Needs flu shot  - Flu vaccine HIGH DOSE PF (Fluzone High dose)

## 2021-06-30 ENCOUNTER — Other Ambulatory Visit: Payer: Self-pay | Admitting: Family Medicine

## 2021-06-30 DIAGNOSIS — E1143 Type 2 diabetes mellitus with diabetic autonomic (poly)neuropathy: Secondary | ICD-10-CM

## 2021-06-30 DIAGNOSIS — F325 Major depressive disorder, single episode, in full remission: Secondary | ICD-10-CM

## 2021-06-30 DIAGNOSIS — I209 Angina pectoris, unspecified: Secondary | ICD-10-CM

## 2021-06-30 DIAGNOSIS — E785 Hyperlipidemia, unspecified: Secondary | ICD-10-CM

## 2021-06-30 DIAGNOSIS — R61 Generalized hyperhidrosis: Secondary | ICD-10-CM

## 2021-06-30 DIAGNOSIS — I10 Essential (primary) hypertension: Secondary | ICD-10-CM

## 2021-06-30 DIAGNOSIS — K219 Gastro-esophageal reflux disease without esophagitis: Secondary | ICD-10-CM

## 2021-07-01 ENCOUNTER — Other Ambulatory Visit: Payer: Self-pay

## 2021-07-01 NOTE — Telephone Encounter (Signed)
Requested Prescriptions  Pending Prescriptions Disp Refills  . pantoprazole (PROTONIX) 40 MG tablet [Pharmacy Med Name: PANTOPRAZOLE SODIUM 40 MG Tablet Delayed Release] 90 tablet 1    Sig: TAKE 1 TABLET (40 MG TOTAL) BY MOUTH EVERY MORNING.     Gastroenterology: Proton Pump Inhibitors Passed - 06/30/2021  8:29 PM      Passed - Valid encounter within last 12 months    Recent Outpatient Visits          2 days ago [facility] Steele Sizer, MD   2 months ago Non-intractable vomiting with nausea, unspecified vomiting type   [facility] Steele Sizer, MD   5 months ago Controlled type 2 diabetes mellitus with complication, with long-term current use of insulin [facility])   [facility] [place], Drue Stager, MD   8 months ago Controlled type 2 diabetes mellitus with complication, with long-term current use of insulin [facility])   [facility] Steele Sizer, MD   9 months ago Vaginal itching   [facility] [place], Drue Stager, MD      Future Appointments            In 3 months Ancil Boozer, Drue Stager, MD [facility], [place]   In 5 months  [facility]           . isosorbide mononitrate (IMDUR) 60 MG 24 hr tablet [Pharmacy Med Name: ISOSORBIDE MONONITRATE ER 60 MG Tablet Extended Release 24 Hour] 90 tablet 1    Sig: TAKE 1 TABLET EVERY DAY     Cardiovascular:  Nitrates Passed - 06/30/2021  8:29 PM      Passed - Last BP in normal range    BP Readings from Last 1 Encounters:  06/29/21 122/70         Passed - Last Heart Rate in normal range    Pulse Readings from Last 1 Encounters:  06/29/21 82         Passed - Valid encounter within last 12 months    Recent Outpatient Visits          2 days ago [facility] [place], Drue Stager, MD   2 months ago Non-intractable vomiting with nausea, unspecified vomiting type   [facility] Steele Sizer, MD   5 months ago Controlled type 2 diabetes mellitus with complication, with long-term current use of insulin [facility])   [facility], [facility], MD   8 months ago Controlled type 2 diabetes mellitus with complication, with long-term current use of insulin [facility])   [facility] Steele Sizer, MD   9 months ago Vaginal itching   [facility] [place], Drue Stager, MD      Future Appointments            In 3 months Ancil Boozer, Drue Stager, MD [facility], [place]   In 5 months  [facility]           . metoprolol succinate (TOPROL-XL) 50 MG 24 hr tablet [Pharmacy Med Name: METOPROLOL SUCCINATE ER 50 MG Tablet Extended Release 24 Hour] 90 tablet 3    Sig: TAKE 1 TABLET EVERY DAY     Cardiovascular:  Beta Blockers Passed - 06/30/2021  8:29 PM      Passed - Last BP in normal range    BP Readings from Last 1 Encounters:  06/29/21 122/70  Passed - Last Heart Rate in normal range    Pulse Readings from Last 1 Encounters:  06/29/21 82         Passed - Valid encounter within last 6 months    Recent Outpatient Visits          2 days ago [facility] [place], Drue Stager, MD   2 months ago Non-intractable vomiting with nausea, unspecified vomiting type   [facility] Steele Sizer, MD   5 months ago Controlled type 2 diabetes mellitus with complication, with long-term current use of insulin [facility])   [facility] [place], Drue Stager, MD   8 months ago Controlled type 2 diabetes mellitus with complication, with long-term current use of insulin [facility])   [facility] Steele Sizer, MD   9 months ago Vaginal itching   [facility] Steele Sizer, MD      Future Appointments            In 3 months Ancil Boozer, Drue Stager, MD [facility],  [place]   In 5 months  [facility], [place]           . rosuvastatin (CRESTOR) 20 MG tablet [Pharmacy Med Name: ROSUVASTATIN CALCIUM 20 MG Tablet] 90 tablet 1    Sig: TAKE 1 TABLET EVERY DAY     Cardiovascular:  Antilipid - Statins Passed - 06/30/2021  8:29 PM      Passed - Total Cholesterol in normal range and within 360 days    Cholesterol, Total  Date Value Ref Range Status  06/28/2015 143 100 - 199 mg/dL Final   Cholesterol  Date Value Ref Range Status  04/13/2021 116 <200 mg/dL Final         Passed - LDL in normal range and within 360 days    LDL Cholesterol (Calc)  Date Value Ref Range Status  04/13/2021 43 mg/dL (calc) Final    Comment:    Reference range: <100 . Desirable range <100 mg/dL for primary prevention;   <70 mg/dL for patients with CHD or diabetic patients  with > or = 2 CHD risk factors. Marland Kitchen LDL-C is now calculated using the Martin-Hopkins  calculation, which is a validated novel method providing  better accuracy than the Friedewald equation in the  estimation of LDL-C.  Cresenciano Genre et al. Annamaria Helling. 2637;858(85): 2061-2068  (http://education.[employer].com/faq/FAQ164)          Passed - HDL in normal range and within 360 days    HDL  Date Value Ref Range Status  04/13/2021 56 > OR = 50 mg/dL Final  06/28/2015 53 >39 mg/dL Final    Comment:    According to ATP-III Guidelines, HDL-C >59 mg/dL is considered a negative risk factor for CHD.          Passed - Triglycerides in normal range and within 360 days    Triglycerides  Date Value Ref Range Status  04/13/2021 91 <150 mg/dL Final         Passed - Patient is not pregnant      Passed - Valid encounter within last 12 months    Recent Outpatient Visits          2 days ago [facility] [place], Drue Stager, MD   2 months ago Non-intractable vomiting with nausea, unspecified vomiting type   [facility] Steele Sizer, MD   5 months  ago Controlled type 2 diabetes  mellitus with complication, with long-term current use of insulin [facility])   [facility] [place], Drue Stager, MD   8 months ago Controlled type 2 diabetes mellitus with complication, with long-term current use of insulin [facility])   [facility] Steele Sizer, MD   9 months ago Vaginal itching   [facility]izer, MD      Future Appointments            In 3 months Ancil Boozer, Drue Stager, MD [facility], [place]   In 5 months  [facility], [place]           . venlafaxine XR (EFFEXOR-XR) 75 MG 24 hr capsule [Pharmacy Med Name: VENLAFAXINE HYDROCHLORIDEER 75 MG Capsule Extended Release 24 Hour] 90 capsule 1    Sig: TAKE 1 CAPSULE EVERY DAY     Psychiatry: Antidepressants - SNRI - desvenlafaxine & venlafaxine Passed - 06/30/2021  8:29 PM      Passed - LDL in normal range and within 360 days    LDL Cholesterol (Calc)  Date Value Ref Range Status  04/13/2021 43 mg/dL (calc) Final    Comment:    Reference range: <100 . Desirable range <100 mg/dL for primary prevention;   <70 mg/dL for patients with CHD or diabetic patients  with > or = 2 CHD risk factors. Marland Kitchen LDL-C is now calculated using the Martin-Hopkins  calculation, which is a validated novel method providing  better accuracy than the Friedewald equation in the  estimation of LDL-C.  Cresenciano Genre et al. Annamaria Helling. 5284;132(44): 2061-2068  (http://education.[facility].com/faq/FAQ164)          Passed - Total Cholesterol in normal range and within 360 days    Cholesterol, Total  Date Value Ref Range Status  06/28/2015 143 100 - 199 mg/dL Final   Cholesterol  Date Value Ref Range Status  04/13/2021 116 <200 mg/dL Final         Passed - Triglycerides in normal range and within 360 days    Triglycerides  Date Value Ref Range Status  04/13/2021 91 <150 mg/dL Final         Passed - Completed PHQ-2 or PHQ-9 in  the last 360 days      Passed - Last BP in normal range    BP Readings from Last 1 Encounters:  06/29/21 122/70         Passed - Valid encounter within last 6 months    Recent Outpatient Visits          2 days ago [facility] [place], Drue Stager, MD   2 months ago Non-intractable vomiting with nausea, unspecified vomiting type   [facility] Steele Sizer, MD   5 months ago Controlled type 2 diabetes mellitus with complication, with long-term current use of insulin [facility])   [facility] [place], Drue Stager, MD   8 months ago Controlled type 2 diabetes mellitus with complication, with long-term current use of insulin [facility])   [facility]izer, MD   9 months ago Vaginal itching   [facility] Sizer, MD      Future Appointments            In 3 months Ancil Boozer, Drue Stager, MD [facility], [place]   In 5 months  [facility], [place]           . metoCLOPramide (REGLAN) 5 MG tablet [  Pharmacy Med Name: METOCLOPRAMIDE HCL 5 MG Tablet] 240 tablet     Sig: TAKE 1 TO 2 TABLETS FOUR TIMES DAILY BEFORE MEALS  AND AT BEDTIME     Not Delegated - Gastroenterology: Antiemetics Failed - 06/30/2021  8:29 PM      Failed - This refill cannot be delegated      Passed - Valid encounter within last 6 months    Recent Outpatient Visits          2 days ago [facility] Steele Sizer, MD   2 months ago Non-intractable vomiting with nausea, unspecified vomiting type   [facility] Steele Sizer, MD   5 months ago Controlled type 2 diabetes mellitus with complication, with long-term current use of insulin [facility])   [facility] [place], Drue Stager, MD   8 months ago Controlled type 2 diabetes mellitus with complication, with long-term current use of insulin [facility])   [facility]izer, MD   9 months ago Vaginal itching   [facility] Steele Sizer, MD      Future Appointments            In 3 months Ancil Boozer, Drue Stager, MD [facility], [place]   In 5 months  [facility], [facility]

## 2021-07-01 NOTE — Telephone Encounter (Signed)
Requested medications are due for refill today yes  Requested medications are on the active medication list yes  Last refill 04/11/21  Last visit 06/29/21  Future visit scheduled 10/14/20  Notes to clinic Not Delegated.  Requested Prescriptions  Pending Prescriptions Disp Refills   metoCLOPramide (REGLAN) 5 MG tablet [Pharmacy Med Name: METOCLOPRAMIDE HCL 5 MG Tablet] 240 tablet     Sig: TAKE 1 TO 2 TABLETS FOUR TIMES DAILY BEFORE MEALS  AND AT BEDTIME     Not Delegated - Gastroenterology: Antiemetics Failed - 06/30/2021  8:29 PM      Failed - This refill cannot be delegated      Passed - Valid encounter within last 6 months    Recent Outpatient Visits           2 days ago [facility]izer, MD   2 months ago Non-intractable vomiting with nausea, unspecified vomiting type   [facility] Steele Sizer, MD   5 months ago Controlled type 2 diabetes mellitus with complication, with long-term current use of insulin [facility])   [facility] [place], Drue Stager, MD   8 months ago Controlled type 2 diabetes mellitus with complication, with long-term current use of insulin [facility])   [facility] Steele Sizer, MD   9 months ago Vaginal itching   [facility] Sizer, MD       Future Appointments             In 3 months Steele Sizer, MD [facility], [place]   In 5 months  [facility]            Signed Prescriptions Disp Refills   pantoprazole (PROTONIX) 40 MG tablet 90 tablet 1    Sig: TAKE 1 TABLET (40 MG TOTAL) BY MOUTH EVERY MORNING.     Gastroenterology: Proton Pump Inhibitors Passed - 06/30/2021  8:29 PM      Passed - Valid encounter within last 12 months    Recent Outpatient Visits           2 days ago [facility] [place], Drue Stager, MD   2 months ago Non-intractable  vomiting with nausea, unspecified vomiting type   [facility] Steele Sizer, MD   5 months ago Controlled type 2 diabetes mellitus with complication, with long-term current use of insulin [facility])   [facility] [place], Drue Stager, MD   8 months ago Controlled type 2 diabetes mellitus with complication, with long-term current use of insulin [facility])   [facility] Steele Sizer, MD   9 months ago Vaginal itching   [facility] [place], Drue Stager, MD       Future Appointments             In 3 months Steele Sizer, MD [facility], [place]   In 5 months  [facility]             isosorbide mononitrate (IMDUR) 60 MG 24 hr tablet 90 tablet 1    Sig: TAKE 1 TABLET EVERY DAY     Cardiovascular:  Nitrates Passed - 06/30/2021  8:29 PM      Passed - Last BP in normal range    BP Readings from Last 1 Encounters:  06/29/21 122/70          Passed - Last Heart Rate in  normal range    Pulse Readings from Last 1 Encounters:  06/29/21 82          Passed - Valid encounter within last 12 months    Recent Outpatient Visits           2 days ago [facility] [place], Drue Stager, MD   2 months ago Non-intractable vomiting with nausea, unspecified vomiting type   [facility] Steele Sizer, MD   5 months ago Controlled type 2 diabetes mellitus with complication, with long-term current use of insulin [facility])   [facility] [place], Drue Stager, MD   8 months ago Controlled type 2 diabetes mellitus with complication, with long-term current use of insulin [facility])   [facility] Steele Sizer, MD   9 months ago Vaginal itching   [facility] [place], Drue Stager, MD       Future Appointments             In 3 months Ancil Boozer, Drue Stager, MD [facility], [place]   In 5  months  [facility]             metoprolol succinate (TOPROL-XL) 50 MG 24 hr tablet 90 tablet 3    Sig: TAKE 1 TABLET EVERY DAY     Cardiovascular:  Beta Blockers Passed - 06/30/2021  8:29 PM      Passed - Last BP in normal range    BP Readings from Last 1 Encounters:  06/29/21 122/70          Passed - Last Heart Rate in normal range    Pulse Readings from Last 1 Encounters:  06/29/21 82          Passed - Valid encounter within last 6 months    Recent Outpatient Visits           2 days ago [facility] [place], Drue Stager, MD   2 months ago Non-intractable vomiting with nausea, unspecified vomiting type   [facility] [place] Steele Sizer, MD   5 months ago Controlled type 2 diabetes mellitus with complication, with long-term current use of insulin [facility])   [facility] [place], Drue Stager, MD   8 months ago Controlled type 2 diabetes mellitus with complication, with long-term current use of insulin [facility])   [facility] [place], Drue Stager, MD   9 months ago Vaginal itching   [facility] [place], Drue Stager, MD       Future Appointments             In 3 months Ancil Boozer, Drue Stager, MD [facility], [place]   In 5 months  [facility]             rosuvastatin (CRESTOR) 20 MG tablet 90 tablet 1    Sig: TAKE 1 TABLET EVERY DAY     Cardiovascular:  Antilipid - Statins Passed - 06/30/2021  8:29 PM      Passed - Total Cholesterol in normal range and within 360 days    Cholesterol, Total  Date Value Ref Range Status  06/28/2015 143 100 - 199 mg/dL Final   Cholesterol  Date Value Ref Range Status  04/13/2021 116 <200 mg/dL Final          Passed - LDL in normal range and within 360 days    LDL Cholesterol (Calc)  Date Value Ref Range  Status  04/13/2021 43 mg/dL (calc) Final    Comment:    Reference range:  <100 . Desirable range <100 mg/dL for primary prevention;   <70 mg/dL for patients with CHD or diabetic patients  with > or = 2 CHD risk factors. Marland Kitchen LDL-C is now calculated using the Martin-Hopkins  calculation, which is a validated novel method providing  better accuracy than the Friedewald equation in the  estimation of LDL-C.  Cresenciano Genre et al. Annamaria Helling. 6578;469(62): 2061-2068  (http://education.[facility].com/faq/FAQ164)           Passed - HDL in normal range and within 360 days    HDL  Date Value Ref Range Status  04/13/2021 56 > OR = 50 mg/dL Final  06/28/2015 53 >39 mg/dL Final    Comment:    According to ATP-III Guidelines, HDL-C >59 mg/dL is considered a negative risk factor for CHD.           Passed - Triglycerides in normal range and within 360 days    Triglycerides  Date Value Ref Range Status  04/13/2021 91 <150 mg/dL Final          Passed - Patient is not pregnant      Passed - Valid encounter within last 12 months    Recent Outpatient Visits           2 days ago [facility] [place], Drue Stager, MD   2 months ago Non-intractable vomiting with nausea, unspecified vomiting type   [facility] Steele Sizer, MD   5 months ago Controlled type 2 diabetes mellitus with complication, with long-term current use of insulin [facility])   [facility] [place], Drue Stager, MD   8 months ago Controlled type 2 diabetes mellitus with complication, with long-term current use of insulin [facility])   [facility]izer, MD   9 months ago Vaginal itching   [facility] [place], Drue Stager, MD       Future Appointments             In 3 months Ancil Boozer, Drue Stager, MD [facility], [place]   In 5 months  [facility]             venlafaxine XR (EFFEXOR-XR) 75 MG 24 hr capsule 90 capsule 1    Sig: TAKE 1 CAPSULE EVERY DAY      Psychiatry: Antidepressants - SNRI - desvenlafaxine & venlafaxine Passed - 06/30/2021  8:29 PM      Passed - LDL in normal range and within 360 days    LDL Cholesterol (Calc)  Date Value Ref Range Status  04/13/2021 43 mg/dL (calc) Final    Comment:    Reference range: <100 . Desirable range <100 mg/dL for primary prevention;   <70 mg/dL for patients with CHD or diabetic patients  with > or = 2 CHD risk factors. Marland Kitchen LDL-C is now calculated using the Martin-Hopkins  calculation, which is a validated novel method providing  better accuracy than the Friedewald equation in the  estimation of LDL-C.  Cresenciano Genre et al. Annamaria Helling. 9528;413(24): 2061-2068  (http://education.[facility].com/faq/FAQ164)           Passed - Total Cholesterol in normal range and within 360 days    Cholesterol, Total  Date Value Ref Range Status  06/28/2015 143 100 - 199 mg/dL Final   Cholesterol  Date Value Ref Range Status  04/13/2021 116 <200 mg/dL Final  Passed - Triglycerides in normal range and within 360 days    Triglycerides  Date Value Ref Range Status  04/13/2021 91 <150 mg/dL Final          Passed - Completed PHQ-2 or PHQ-9 in the last 360 days      Passed - Last BP in normal range    BP Readings from Last 1 Encounters:  06/29/21 122/70          Passed - Valid encounter within last 6 months    Recent Outpatient Visits           2 days ago [facility] [place], Drue Stager, MD   2 months ago Non-intractable vomiting with nausea, unspecified vomiting type   [facility] Steele Sizer, MD   5 months ago Controlled type 2 diabetes mellitus with complication, with long-term current use of insulin [facility])   [facility] [place], Drue Stager, MD   8 months ago Controlled type 2 diabetes mellitus with complication, with long-term current use of insulin [facility])   [facility] Steele Sizer, MD   9  months ago Vaginal itching   [facility] Steele Sizer, MD       Future Appointments             In 3 months Ancil Boozer, Drue Stager, MD [facility], [place]   In 5 months  [facility], [facility]

## 2021-07-07 ENCOUNTER — Telehealth: Payer: Self-pay | Admitting: Family Medicine

## 2021-07-07 NOTE — Telephone Encounter (Signed)
Pt called to report that she likes the [facility] inhaler that her PCP gave her a sample of, she says she prefers this one over the other. Wants call back to discuss  Best contact: [phone] or [phone]

## 2021-07-08 ENCOUNTER — Other Ambulatory Visit: Payer: Self-pay | Admitting: Family Medicine

## 2021-07-08 DIAGNOSIS — J410 Simple chronic bronchitis: Secondary | ICD-10-CM

## 2021-07-08 NOTE — Telephone Encounter (Signed)
Lvm to verify the refill and to which pharmacy she'd like.

## 2021-07-08 NOTE — Telephone Encounter (Signed)
Pt returned call to advise that she would like Rx for [facility] inhaler to [facility] to [facility] [address] in [place]

## 2021-07-09 ENCOUNTER — Other Ambulatory Visit: Payer: Self-pay | Admitting: Family Medicine

## 2021-07-09 DIAGNOSIS — J410 Simple chronic bronchitis: Secondary | ICD-10-CM

## 2021-07-09 MED ORDER — UMECLIDINIUM-VILANTEROL 62.5-25 MCG/ACT IN AEPB: 1.0000 | INHALATION_SPRAY | Freq: Every day | RESPIRATORY_TRACT | 2 refills | Status: DC

## 2021-07-26 ENCOUNTER — Other Ambulatory Visit: Payer: Self-pay | Admitting: Family Medicine

## 2021-07-26 ENCOUNTER — Telehealth: Payer: Self-pay | Admitting: Family Medicine

## 2021-07-26 DIAGNOSIS — R61 Generalized hyperhidrosis: Secondary | ICD-10-CM

## 2021-07-26 NOTE — Telephone Encounter (Signed)
Pt spoke with CPAP technician today and the pt stated she has not been sleeping well at night and wakes up at 3am and stay up til 10am and then sleep the day away/ pt was advised to get an RX from provider to have CPAP machine turned up / please advise

## 2021-07-27 NOTE — Telephone Encounter (Signed)
Completed new RX forsetting adjustment/faxed to [facility]

## 2021-08-04 DIAGNOSIS — E1143 Type 2 diabetes mellitus with diabetic autonomic (poly)neuropathy: Secondary | ICD-10-CM | POA: Diagnosis not present

## 2021-08-04 DIAGNOSIS — B351 Tinea unguium: Secondary | ICD-10-CM | POA: Diagnosis not present

## 2021-08-04 DIAGNOSIS — L851 Acquired keratosis [keratoderma] palmaris et plantaris: Secondary | ICD-10-CM | POA: Diagnosis not present

## 2021-08-04 LAB — HM DIABETES FOOT EXAM

## 2021-08-25 ENCOUNTER — Other Ambulatory Visit: Payer: Self-pay | Admitting: Family Medicine

## 2021-08-30 NOTE — Progress Notes (Signed)
     C  A N  C  E L  L  E  D          

## 2021-09-19 ENCOUNTER — Telehealth: Payer: Self-pay

## 2021-09-19 NOTE — Progress Notes (Signed)
I reach out to patient to schedule appointment with Cathe Mons the clinical pharmacist since the patient never had appointment with him.Patient last clinical pharmacist visit was with Milus Height on 05/26/2020.  Patient schedule a telephone appointment with the clinical pharmacist on 01/11/2022 at 11:00 am.  [place] Pharmacist Assistant [phone]

## 2021-10-07 ENCOUNTER — Encounter: Payer: Self-pay | Admitting: Hematology and Oncology

## 2021-10-10 ENCOUNTER — Other Ambulatory Visit: Payer: Self-pay

## 2021-10-10 DIAGNOSIS — J3089 Other allergic rhinitis: Secondary | ICD-10-CM

## 2021-10-10 MED ORDER — FLUTICASONE PROPIONATE 50 MCG/ACT NA SUSP: 2.0000 | Freq: Every day | NASAL | 2 refills | Status: DC

## 2021-10-13 ENCOUNTER — Telehealth: Payer: Self-pay | Admitting: Acute Care

## 2021-10-13 NOTE — Telephone Encounter (Signed)
Contacted patient for scheduling of annual LDCT.  Patient states she cannot schedule at this moment but will call us back to set up next scan

## 2021-10-14 ENCOUNTER — Ambulatory Visit: Payer: Medicare HMO | Admitting: Family Medicine

## 2021-10-18 ENCOUNTER — Other Ambulatory Visit: Payer: Self-pay

## 2021-10-18 DIAGNOSIS — J3089 Other allergic rhinitis: Secondary | ICD-10-CM

## 2021-10-18 MED ORDER — FLUTICASONE PROPIONATE 50 MCG/ACT NA SUSP: 2.0000 | Freq: Every day | NASAL | 2 refills | Status: DC

## 2021-10-30 ENCOUNTER — Other Ambulatory Visit: Payer: Self-pay | Admitting: Family Medicine

## 2021-10-30 DIAGNOSIS — M791 Myalgia, unspecified site: Secondary | ICD-10-CM

## 2021-10-30 DIAGNOSIS — J3089 Other allergic rhinitis: Secondary | ICD-10-CM

## 2021-10-30 DIAGNOSIS — I10 Essential (primary) hypertension: Secondary | ICD-10-CM

## 2021-11-01 NOTE — Progress Notes (Signed)
Name: Dana Sparks   MRN: [id number]    DOB: 04-16-1951   Date:11/02/2021       Progress Note  Subjective  Chief Complaint  Follow Up  HPI   Obesity:she is now BMI below 35, eating healthier, advised her to resume regular physical activity    Hyperlipidemia/Atherosclerosis of aorta: taking statin therapy, no muscle problems, reviewed last labs with patient , LDL 66 , continue medication, denies myalgias . We will recheck labs today   DMII with gastroparesis/CKI/neuropathy: she is taking medication as prescribed, seeing Dr. Gabriel Carina.  Her hgbA1C  was 7.8% to 8.2%, 8.2%  6.8 % 6.3 % 6.4 % down to 6.1 % S he is on Trulicity, Synjardi and Basaglar 60 units, she states since she started taking Synjardi at night, nausea and indigestion resolved. . She is getting it through [employer] and has been compliant with mediation. She has gastroparesis controlled with reglan before meals, dyslipidemia on crestor, on ARB for microalbuminuria. She has intermittent  polyphagia, she denies  polyuria or polydipsia. Eye exam is up to date. She has vascular disease and intermittent claudication. She is taking aspirin daily. She states glucose fasting as low as 70's- low 100's    HTN: she was on Norvasc 2.5 mg , Losartan and Toprol xl, bp is  at goal today  . No chest pain at this time.   Angina: CAD, her cardiologist is Dr. Clayborn Bigness. She takes statin therapy, aspirin , beta blocker and ARB  and Imdur , angina under control with medical management . She was on disability for heart disease for years but in social security now.  Last  Echo from 2016 showed decreased in EF 25-30% and global hypokines. She has not been active to know if SOB is still present ,  she does not have orthopnea, denies lower extremity edema .    FINDINGS: Myoview done 10/21/2020  EF 64 % Regional wall motion:  reveals normal myocardial thickening and wall  motion.  The overall quality of the study is good.    Artifacts noted: no   Left ventricular cavity: normal.   Perfusion Analysis:  SPECT images demonstrate homogeneous tracer  distribution throughout the myocardium. Defect type : Normal     OSA: she has been wearing CPAP only two night a week,  explained she has pulmonary hypertension needs to wear CPAP 7 nights for at least 4 hours    Major Depression in remission  she is doing well on Effexor, phq 9 is normal today, no side effects of medication . She states feeling well for a while but wants to continue medication since it also helps with hot flashes Continue current regiment    Hyperlipidemia: taking statin therapy, no muscle problems, reviewed last labs with patient , LDL below 60 now  , continue medication, denies myalgias , she has occasional cramps .   Chronic bronchitis:  she used to smoke 1 pack daily for 50 years, quit smoking in 2014 she is now on Advair and no longer a productive cough in am, no SOB or wheezing    PVD with claudication/Atherosclerosis of aorta: on aspirin and statin therapy,she states she has been lazy and stopped walking, discussed importance of resuming regular physical activity . She has a history of claudication but not walking far to have symptoms    Hot  Flashes: she is on Effexor , Premarin, black cohosh,  and clonidine, a few years ago her symptoms were severe when she stopped Effexor,  stable at this time  Patient Active Problem List   Diagnosis Date Noted   Emphysema lung ([place]) 07/09/2020   Hypercalcemia 07/05/2020   Chronic bronchitis ([place]) 02/22/2020   Disease of stomach    Hx of colonic polyps    Hepatomegaly 07/23/2018   Fatty liver 07/23/2018   Aortic atherosclerosis ([place]) 07/09/2018   Diabetes mellitus type 2, controlled, with complications ([place]) 92/42/6834   Nasal polyp 09/03/2017   Lipoma of back 09/03/2017   Claudication ([place]) 06/04/2017   LVH (left ventricular hypertrophy) 02/27/2017   Decreased cardiac ejection fraction 02/27/2017   Left hand weakness  10/12/2016   Elevated antinuclear antibody (ANA) level 04/27/2016   Angina pectoris ([place]) 11/01/2015   Well controlled type 2 diabetes mellitus with gastroparesis ([place]) 06/22/2015   Low TSH level 06/22/2015   Iron deficiency anemia 04/07/2015   Intestinal metaplasia of gastric mucosa 03/11/2015   CAD (coronary artery disease), native coronary artery 02/24/2015   History of coronary artery stent placement 02/24/2015   Chronic kidney disease, stage 3, mod decreased GFR ([place]) 02/24/2015   Menopause 19/62/2297   History of Helicobacter pylori infection 02/24/2015   Mild mitral insufficiency 02/24/2015   Mild tricuspid insufficiency 02/24/2015   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 ([place]) 02/24/2015   Controlled gout 02/24/2015   Depression, major, recurrent, mild ([place]) 02/24/2015   Mild pulmonary hypertension ([place]) 02/24/2015   Gastroesophageal reflux disease without esophagitis 02/24/2015   Perennial allergic rhinitis 02/24/2015   HLD (hyperlipidemia) 02/20/2015   Hypertension, benign 02/20/2015   Apnea, sleep 02/20/2015   Controlled diabetes mellitus with stage 3 chronic kidney disease, without long-term current use of insulin ([place]) 02/20/2015    Past Surgical History:  Procedure Laterality Date   ABDOMINAL HYSTERECTOMY     total   CATARACT EXTRACTION     CATARACT EXTRACTION W/PHACO Left 03/24/2019   Procedure: CATARACT EXTRACTION PHACO AND INTRAOCULAR LENS PLACEMENT ([place])  LEFT DIABETIC;  Surgeon: Eulogio Bear, MD;  Location: [facility];  Service: Ophthalmology;  Laterality: Left;  Diabetic - insulin and oral meds sleep apnea   COLONOSCOPY  01/2014   sigmoid diverticulosis. desc colon TA, hyperplastic polyp   COLONOSCOPY WITH PROPOFOL N/A 09/02/2018   Procedure: COLONOSCOPY WITH PROPOFOL;  Surgeon: Lucilla Lame, MD;  Location: [facility];  Service: Endoscopy;  Laterality: N/A;   CORONARY ANGIOPLASTY WITH STENT PLACEMENT      ESOPHAGOGASTRODUODENOSCOPY N/A 02/16/2015   negative h pylori, focal gastic intestinal metaplasia   ESOPHAGOGASTRODUODENOSCOPY (EGD) WITH PROPOFOL N/A 09/02/2018   Procedure: ESOPHAGOGASTRODUODENOSCOPY (EGD) WITH BIOPSIES;  Surgeon: Lucilla Lame, MD;  Location: [facility];  Service: Endoscopy;  Laterality: N/A;  diabetic -insulin and oral meds sleep apnea   TOTAL KNEE ARTHROPLASTY Right     Family History  Problem Relation Age of Onset   Stroke Sister    Hyperlipidemia Sister    Alcohol abuse Brother    Diabetes Brother    Stroke Brother    Hypertension Mother    Diabetes Mother    Heart disease Mother    Diabetes Father    Heart disease Father    Hypertension Father    Hypertension Sister    Cancer Maternal Grandmother        Unsure   Diabetes Maternal Grandfather    Colon cancer Neg Hx    Liver disease Neg Hx    Breast cancer Neg Hx     Social History   Tobacco Use   Smoking status:  Former    Packs/day: 1.00    Years: 40.00    Pack years: 40.00    Types: Cigarettes    Start date: 09/04/1972    Quit date: 12/30/2012    Years since quitting: 8.8   Smokeless tobacco: Never   Tobacco comments:    smoking cessation materials not required  Substance Use Topics   Alcohol use: No    Alcohol/week: 0.0 standard drinks     Current Outpatient Medications:    ACCU-CHEK FASTCLIX LANCETS MISC, , Disp: , Rfl:    ACCU-CHEK SMARTVIEW test strip, Check fsbs two times daily  DMII, Disp: 100 each, Rfl: 6   Alcohol Swabs (B-D SINGLE USE SWABS REGULAR) PADS, 1 each by Does not apply route 4 (four) times daily., Disp: 200 each, Rfl: 2   allopurinol (ZYLOPRIM) 100 MG tablet, TAKE 1 TABLET EVERY DAY, Disp: 90 tablet, Rfl: 1   amLODipine (NORVASC) 2.5 MG tablet, TAKE 1 TABLET EVERY DAY, Disp: 90 tablet, Rfl: 1   aspirin EC 81 MG tablet, Take 81 mg by mouth every morning. , Disp: , Rfl:    BD INSULIN SYRINGE U/F 31G X 5/16" 0.3 ML MISC, , Disp: , Rfl:    blood glucose meter kit  and supplies KIT, Dispense based on patient and insurance preference. Use up to four times daily as directed. (FOR ICD-9 250.00, 250.01)., Disp: 1 each, Rfl: 0   Cholecalciferol (VITAMIN D-3 PO), Take 1,000 Units by mouth daily. , Disp: , Rfl:    cloNIDine (CATAPRES) 0.1 MG tablet, TAKE 1 TABLET (0.1 MG TOTAL) BY MOUTH EVERY EVENING., Disp: 90 tablet, Rfl: 1   Coenzyme Q10 (CO Q-10) 100 MG CAPS, TAKE 1 CAPSULE EVERY DAY, Disp: 90 capsule, Rfl: 1   cyclobenzaprine (FLEXERIL) 5 MG tablet, TAKE 1 TO 2 TABLETS AT BEDTIME AS NEEDED FOR MUSCLE SPASM(S). MAY INCREASE TO 2 TABS PENDING RESPONSE., Disp: 90 tablet, Rfl: 0   DENTA 5000 PLUS 1.1 % CREA dental cream, , Disp: , Rfl:    Dulaglutide 1.5 MG/0.5ML SOPN, Inject 1.5 mg into the skin once a week., Disp: , Rfl:    estradiol (ESTRACE) 0.5 MG tablet, TAKE 1 TABLET (0.5 MG TOTAL) BY MOUTH DAILY., Disp: 90 tablet, Rfl: 1   Ferrous Sulfate (IRON) 325 (65 Fe) MG TABS, Take 325 mg by mouth daily. Nature made brand-, Disp: , Rfl:    fluticasone (FLONASE) 50 MCG/ACT nasal spray, Place 2 sprays into both nostrils daily., Disp: 48 g, Rfl: 2   fluticasone-salmeterol (ADVAIR DISKUS) 250-50 MCG/ACT AEPB, Inhale 1 puff into the lungs in the morning and at bedtime., Disp: 180 each, Rfl: 1   Insulin Glargine (BASAGLAR KWIKPEN) 100 UNIT/ML SOPN, Inject 60 Units into the skin at bedtime., Disp: , Rfl:    Insulin Pen Needle (FIFTY50 PEN NEEDLES) 31G X 5 MM MISC, Use once daily, Disp: , Rfl:    ipratropium (ATROVENT) 0.03 % nasal spray, Place 2 sprays into both nostrils every 12 (twelve) hours., Disp: 90 mL, Rfl: 1   isosorbide mononitrate (IMDUR) 60 MG 24 hr tablet, TAKE 1 TABLET EVERY DAY, Disp: 90 tablet, Rfl: 1   loratadine (CLARITIN) 10 MG tablet, TAKE 1 TABLET EVERY DAY, Disp: 90 tablet, Rfl: 1   losartan (COZAAR) 50 MG tablet, TAKE 1 TABLET EVERY DAY, Disp: 90 tablet, Rfl: 1   metoCLOPramide (REGLAN) 5 MG tablet, Take 1 tablet (5 mg total) by mouth 3 (three) times  daily before meals. TAKE 1 TO 2 TABLETS FOUR TIMES  DAILY BEFORE MEALS  AND AT BEDTIME, Disp: 270 tablet, Rfl: 0   metoprolol succinate (TOPROL-XL) 50 MG 24 hr tablet, TAKE 1 TABLET EVERY DAY, Disp: 90 tablet, Rfl: 3   montelukast (SINGULAIR) 10 MG tablet, Take 1 tablet (10 mg total) by mouth every evening., Disp: 90 tablet, Rfl: 1   pantoprazole (PROTONIX) 40 MG tablet, TAKE 1 TABLET (40 MG TOTAL) BY MOUTH EVERY MORNING., Disp: 90 tablet, Rfl: 1   rosuvastatin (CRESTOR) 20 MG tablet, TAKE 1 TABLET EVERY DAY, Disp: 90 tablet, Rfl: 1   SYNJARDY XR 12.01-999 MG TB24, Take 2 tablets by mouth daily., Disp: , Rfl:    venlafaxine XR (EFFEXOR-XR) 75 MG 24 hr capsule, TAKE 1 CAPSULE EVERY DAY, Disp: 90 capsule, Rfl: 1   CINNAMON PO, Take 1,000 mg by mouth daily. (Patient not taking: Reported on 11/02/2021), Disp: , Rfl:   Allergies  Allergen Reactions   Codeine Other (See Comments)    Unknown reaction   Contrast Media [Iodinated Contrast Media] Itching   Lisinopril Cough   Sulfa Antibiotics Itching    I personally reviewed active problem list, medication list, allergies, family history, social history, health maintenance with the patient/caregiver today.   ROS  Constitutional: Negative for fever or weight change.  Respiratory: Negative for cough and shortness of breath.   Cardiovascular: Negative for chest pain or palpitations.  Gastrointestinal: Negative for abdominal pain, no bowel changes.  Musculoskeletal: Negative for gait problem or joint swelling.  Skin: Negative for rash.  Neurological: Negative for dizziness or headache.  No other specific complaints in a complete review of systems (except as listed in HPI above).   Objective  Vitals:   11/02/21 0805  BP: 128/74  Pulse: 92  Resp: 16  Temp: 98 F (36.7 C)  TempSrc: Oral  SpO2: 92%  Weight: 212 lb 4.8 oz (96.3 kg)  Height: 5' 5.5" (1.664 m)    Body mass index is 34.79 kg/m.  Physical Exam  Constitutional: Patient  appears well-developed and well-nourished. Obese  No distress.  HEENT: head atraumatic, normocephalic, pupils equal and reactive to light, neck supple Cardiovascular: Normal rate, regular rhythm and normal heart sounds.  No murmur heard. No BLE edema. Pulmonary/Chest: Effort normal and breath sounds normal. No respiratory distress. Abdominal: Soft.  There is no tenderness. Psychiatric: Patient has a normal mood and affect. behavior is normal. Judgment and thought content normal.   PHQ2/9: Depression screen [facility] 2/9 11/02/2021 06/29/2021 04/13/2021 01/11/2021 12/14/2020  Decreased Interest 0 0 2 0 0  Down, Depressed, Hopeless 0 0 0 0 0  PHQ - 2 Score 0 0 2 0 0  Altered sleeping 0 - 0 0 -  Tired, decreased energy 0 - 0 0 -  Change in appetite 0 - 2 0 -  Feeling bad or failure about yourself  0 - 0 0 -  Trouble concentrating 0 - 0 0 -  Moving slowly or fidgety/restless 0 - 0 0 -  Suicidal thoughts 0 - 0 0 -  PHQ-9 Score 0 - 4 0 -  Difficult doing work/chores - - - Not difficult at all -  Some recent data might be hidden    phq 9 is negative   Fall Risk: Fall Risk  11/02/2021 06/29/2021 04/13/2021 01/11/2021 12/14/2020  Falls in the past year? 0 0 0 0 0  Number falls in past yr: - 0 0 0 0  Injury with Fall? - 0 0 0 0  Comment - - - - -  Risk for fall due to : - No Fall Risks - - No Fall Risks  Follow up Falls prevention discussed Falls prevention discussed - - Falls prevention discussed      Functional Status Survey: Is the patient deaf or have difficulty hearing?: No Does the patient have difficulty seeing, even when wearing glasses/contacts?: No Does the patient have difficulty concentrating, remembering, or making decisions?: No Does the patient have difficulty walking or climbing stairs?: No Does the patient have difficulty dressing or bathing?: No Does the patient have difficulty doing errands alone such as visiting a doctor's office or shopping?: No    Assessment & Plan  1.  Controlled type 2 diabetes mellitus with complication, with long-term current use of insulin (HCC)  Under the care of Endo  2. Need for shingles vaccine  Sent to local pharmacy  3. Simple chronic bronchitis (HCC)  - fluticasone-salmeterol (ADVAIR DISKUS) 250-50 MCG/ACT AEPB; Inhale 1 puff into the lungs in the morning and at bedtime.  Dispense: 180 each; Refill: 1  4. Atherosclerosis of abdominal aorta ([place])   5. Angina pectoris ([place])   6. Mild pulmonary hypertension (HCC)  Needs to resume CPAP  7. Major depression in remission ([place])   8. Atherosclerosis of aorta ([place])   9. Claudication [facility])   Needs to resume regular walking

## 2021-11-02 ENCOUNTER — Encounter: Payer: Self-pay | Admitting: Family Medicine

## 2021-11-02 ENCOUNTER — Ambulatory Visit (INDEPENDENT_AMBULATORY_CARE_PROVIDER_SITE_OTHER): Payer: Medicare PPO | Admitting: Family Medicine

## 2021-11-02 VITALS — BP 128/74 | HR 92 | Temp 98.0°F | Resp 16 | Ht 65.5 in | Wt 212.3 lb

## 2021-11-02 DIAGNOSIS — B351 Tinea unguium: Secondary | ICD-10-CM | POA: Diagnosis not present

## 2021-11-02 DIAGNOSIS — I739 Peripheral vascular disease, unspecified: Secondary | ICD-10-CM

## 2021-11-02 DIAGNOSIS — I209 Angina pectoris, unspecified: Secondary | ICD-10-CM | POA: Diagnosis not present

## 2021-11-02 DIAGNOSIS — Z23 Encounter for immunization: Secondary | ICD-10-CM | POA: Diagnosis not present

## 2021-11-02 DIAGNOSIS — F325 Major depressive disorder, single episode, in full remission: Secondary | ICD-10-CM | POA: Diagnosis not present

## 2021-11-02 DIAGNOSIS — E118 Type 2 diabetes mellitus with unspecified complications: Secondary | ICD-10-CM

## 2021-11-02 DIAGNOSIS — Z794 Long term (current) use of insulin: Secondary | ICD-10-CM | POA: Diagnosis not present

## 2021-11-02 DIAGNOSIS — J410 Simple chronic bronchitis: Secondary | ICD-10-CM

## 2021-11-02 DIAGNOSIS — I700 Atherosclerosis of aorta: Secondary | ICD-10-CM | POA: Diagnosis not present

## 2021-11-02 DIAGNOSIS — E1143 Type 2 diabetes mellitus with diabetic autonomic (poly)neuropathy: Secondary | ICD-10-CM | POA: Diagnosis not present

## 2021-11-02 DIAGNOSIS — I272 Pulmonary hypertension, unspecified: Secondary | ICD-10-CM | POA: Diagnosis not present

## 2021-11-02 MED ORDER — SHINGRIX 50 MCG/0.5ML IM SUSR: 0.5000 mL | Freq: Once | INTRAMUSCULAR | 1 refills | Status: AC

## 2021-11-02 MED ORDER — FLUTICASONE-SALMETEROL 250-50 MCG/ACT IN AEPB: 1.0000 | INHALATION_SPRAY | Freq: Two times a day (BID) | RESPIRATORY_TRACT | 1 refills | Status: DC

## 2021-11-22 DIAGNOSIS — E782 Mixed hyperlipidemia: Secondary | ICD-10-CM | POA: Diagnosis not present

## 2021-11-22 DIAGNOSIS — E119 Type 2 diabetes mellitus without complications: Secondary | ICD-10-CM | POA: Diagnosis not present

## 2021-11-22 DIAGNOSIS — I70219 Atherosclerosis of native arteries of extremities with intermittent claudication, unspecified extremity: Secondary | ICD-10-CM | POA: Diagnosis not present

## 2021-11-22 DIAGNOSIS — R0602 Shortness of breath: Secondary | ICD-10-CM | POA: Diagnosis not present

## 2021-11-22 DIAGNOSIS — I340 Nonrheumatic mitral (valve) insufficiency: Secondary | ICD-10-CM | POA: Diagnosis not present

## 2021-11-22 DIAGNOSIS — I2510 Atherosclerotic heart disease of native coronary artery without angina pectoris: Secondary | ICD-10-CM | POA: Diagnosis not present

## 2021-11-22 DIAGNOSIS — I700 Atherosclerosis of aorta: Secondary | ICD-10-CM | POA: Diagnosis not present

## 2021-11-22 DIAGNOSIS — I10 Essential (primary) hypertension: Secondary | ICD-10-CM | POA: Diagnosis not present

## 2021-11-22 DIAGNOSIS — G4733 Obstructive sleep apnea (adult) (pediatric): Secondary | ICD-10-CM | POA: Diagnosis not present

## 2021-12-05 DIAGNOSIS — H40003 Preglaucoma, unspecified, bilateral: Secondary | ICD-10-CM | POA: Diagnosis not present

## 2021-12-05 LAB — HM DIABETES EYE EXAM

## 2021-12-15 ENCOUNTER — Ambulatory Visit: Payer: Medicare HMO

## 2021-12-16 DIAGNOSIS — E1142 Type 2 diabetes mellitus with diabetic polyneuropathy: Secondary | ICD-10-CM | POA: Diagnosis not present

## 2021-12-16 LAB — PROTEIN / CREATININE RATIO, URINE
Albumin, U: <7
Creatinine, Urine: 72.8

## 2021-12-16 LAB — HEMOGLOBIN A1C: Hemoglobin A1C: 6.2

## 2021-12-16 LAB — MICROALBUMIN / CREATININE URINE RATIO: Microalb Creat Ratio: <9.6

## 2021-12-21 DIAGNOSIS — Z794 Long term (current) use of insulin: Secondary | ICD-10-CM | POA: Diagnosis not present

## 2021-12-21 DIAGNOSIS — E1169 Type 2 diabetes mellitus with other specified complication: Secondary | ICD-10-CM | POA: Diagnosis not present

## 2021-12-21 DIAGNOSIS — E785 Hyperlipidemia, unspecified: Secondary | ICD-10-CM | POA: Diagnosis not present

## 2021-12-21 DIAGNOSIS — E669 Obesity, unspecified: Secondary | ICD-10-CM | POA: Diagnosis not present

## 2021-12-21 DIAGNOSIS — E1159 Type 2 diabetes mellitus with other circulatory complications: Secondary | ICD-10-CM | POA: Diagnosis not present

## 2021-12-21 DIAGNOSIS — E1142 Type 2 diabetes mellitus with diabetic polyneuropathy: Secondary | ICD-10-CM | POA: Diagnosis not present

## 2021-12-22 ENCOUNTER — Ambulatory Visit (INDEPENDENT_AMBULATORY_CARE_PROVIDER_SITE_OTHER): Payer: Medicare PPO

## 2021-12-22 ENCOUNTER — Other Ambulatory Visit: Payer: Self-pay | Admitting: Family Medicine

## 2021-12-22 DIAGNOSIS — I10 Essential (primary) hypertension: Secondary | ICD-10-CM

## 2021-12-22 DIAGNOSIS — Z0000 Encounter for general adult medical examination without abnormal findings: Secondary | ICD-10-CM | POA: Diagnosis not present

## 2021-12-22 NOTE — Patient Instructions (Signed)
Dana Sparks , ?Thank you for taking time to come for your Medicare Wellness Visit. I appreciate your ongoing commitment to your health goals. Please review the following plan we discussed and let me know if I can assist you in the future.  ? ?Screening recommendations/referrals: ?Colonoscopy: done 09/02/18. Repeat 08/2023 ?Mammogram: done 04/26/21 ?Bone Density: done 01/08/20 ?Recommended yearly ophthalmology/optometry visit for glaucoma screening and checkup ?Recommended yearly dental visit for hygiene and checkup ? ?Vaccinations: ?Influenza vaccine: done 06/29/21 ?Pneumococcal vaccine: done 11/10/16 ?Tdap vaccine: done 03/10/20 ?Shingles vaccine: Shingrix discussed. Please contact your pharmacy for coverage information.  ?Covid-19: done 09/29/19, 10/27/19, 06/29/20, 12/09/20 & 06/16/21 ? ?Advanced directives: Advance directive discussed with you today. Even though you declined this today please call our office should you change your mind and we can give you the proper paperwork for you to fill out.  ? ?Conditions/risks identified: Keep up the great work! ? ?Next appointment: Follow up in one year for your annual wellness visit  ? ? ?Preventive Care 71 Years and Older, Female ?Preventive care refers to lifestyle choices and visits with your health care provider that can promote health and wellness. ?What does preventive care include? ?A yearly physical exam. This is also called an annual well check. ?Dental exams once or twice a year. ?Routine eye exams. Ask your health care provider how often you should have your eyes checked. ?Personal lifestyle choices, including: ?Daily care of your teeth and gums. ?Regular physical activity. ?Eating a healthy diet. ?Avoiding tobacco and drug use. ?Limiting alcohol use. ?Practicing safe sex. ?Taking low-dose aspirin every day. ?Taking vitamin and mineral supplements as recommended by your health care provider. ?What happens during an annual well check? ?The services and screenings done by  your health care provider during your annual well check will depend on your age, overall health, lifestyle risk factors, and family history of disease. ?Counseling  ?Your health care provider may ask you questions about your: ?Alcohol use. ?Tobacco use. ?Drug use. ?Emotional well-being. ?Home and relationship well-being. ?Sexual activity. ?Eating habits. ?History of falls. ?Memory and ability to understand (cognition). ?Work and work Statistician. ?Reproductive health. ?Screening  ?You may have the following tests or measurements: ?Height, weight, and BMI. ?Blood pressure. ?Lipid and cholesterol levels. These may be checked every 5 years, or more frequently if you are over 30 years old. ?Skin check. ?Lung cancer screening. You may have this screening every year starting at age 66 if you have a 30-pack-year history of smoking and currently smoke or have quit within the past 15 years. ?Fecal occult blood test (FOBT) of the stool. You may have this test every year starting at age 54. ?Flexible sigmoidoscopy or colonoscopy. You may have a sigmoidoscopy every 5 years or a colonoscopy every 10 years starting at age 62. ?Hepatitis C blood test. ?Hepatitis B blood test. ?Sexually transmitted disease (STD) testing. ?Diabetes screening. This is done by checking your blood sugar (glucose) after you have not eaten for a while (fasting). You may have this done every 1-3 years. ?Bone density scan. This is done to screen for osteoporosis. You may have this done starting at age 27. ?Mammogram. This may be done every 1-2 years. Talk to your health care provider about how often you should have regular mammograms. ?Talk with your health care provider about your test results, treatment options, and if necessary, the need for more tests. ?Vaccines  ?Your health care provider may recommend certain vaccines, such as: ?Influenza vaccine. This is recommended every year. ?Tetanus,  diphtheria, and acellular pertussis (Tdap, Td) vaccine. You  may need a Td booster every 10 years. ?Zoster vaccine. You may need this after age 15. ?Pneumococcal 13-valent conjugate (PCV13) vaccine. One dose is recommended after age 71. ?Pneumococcal polysaccharide (PPSV23) vaccine. One dose is recommended after age 72. ?Talk to your health care provider about which screenings and vaccines you need and how often you need them. ?This information is not intended to replace advice given to you by your health care provider. Make sure you discuss any questions you have with your health care provider. ?Document Released: 09/17/2015 Document Revised: 05/10/2016 Document Reviewed: 06/22/2015 ?Elsevier Interactive Patient Education ? 2017 [place]. ? ?Fall Prevention in the Home ?Falls can cause injuries. They can happen to people of all ages. There are many things you can do to make your home safe and to help prevent falls. ?What can I do on the outside of my home? ?Regularly fix the edges of walkways and driveways and fix any cracks. ?Remove anything that might make you trip as you walk through a door, such as a raised step or threshold. ?Trim any bushes or trees on the path to your home. ?Use bright outdoor lighting. ?Clear any walking paths of anything that might make someone trip, such as rocks or tools. ?Regularly check to see if handrails are loose or broken. Make sure that both sides of any steps have handrails. ?Any raised decks and porches should have guardrails on the edges. ?Have any leaves, snow, or ice cleared regularly. ?Use sand or salt on walking paths during winter. ?Clean up any spills in your garage right away. This includes oil or grease spills. ?What can I do in the bathroom? ?Use night lights. ?Install grab bars by the toilet and in the tub and shower. Do not use towel bars as grab bars. ?Use non-skid mats or decals in the tub or shower. ?If you need to sit down in the shower, use a plastic, non-slip stool. ?Keep the floor dry. Clean up any water that spills  on the floor as soon as it happens. ?Remove soap buildup in the tub or shower regularly. ?Attach bath mats securely with double-sided non-slip rug tape. ?Do not have throw rugs and other things on the floor that can make you trip. ?What can I do in the bedroom? ?Use night lights. ?Make sure that you have a light by your bed that is easy to reach. ?Do not use any sheets or blankets that are too big for your bed. They should not hang down onto the floor. ?Have a firm chair that has side arms. You can use this for support while you get dressed. ?Do not have throw rugs and other things on the floor that can make you trip. ?What can I do in the kitchen? ?Clean up any spills right away. ?Avoid walking on wet floors. ?Keep items that you use a lot in easy-to-reach places. ?If you need to reach something above you, use a strong step stool that has a grab bar. ?Keep electrical cords out of the way. ?Do not use floor polish or wax that makes floors slippery. If you must use wax, use non-skid floor wax. ?Do not have throw rugs and other things on the floor that can make you trip. ?What can I do with my stairs? ?Do not leave any items on the stairs. ?Make sure that there are handrails on both sides of the stairs and use them. Fix handrails that are broken or loose. Make  sure that handrails are as long as the stairways. ?Check any carpeting to make sure that it is firmly attached to the stairs. Fix any carpet that is loose or worn. ?Avoid having throw rugs at the top or bottom of the stairs. If you do have throw rugs, attach them to the floor with carpet tape. ?Make sure that you have a light switch at the top of the stairs and the bottom of the stairs. If you do not have them, ask someone to add them for you. ?What else can I do to help prevent falls? ?Wear shoes that: ?Do not have high heels. ?Have rubber bottoms. ?Are comfortable and fit you well. ?Are closed at the toe. Do not wear sandals. ?If you use a stepladder: ?Make  sure that it is fully opened. Do not climb a closed stepladder. ?Make sure that both sides of the stepladder are locked into place. ?Ask someone to hold it for you, if possible. ?Clearly mark and make sure

## 2021-12-22 NOTE — Progress Notes (Signed)
? ?Subjective:  ? Dana Sparks is a 71 y.o. female who presents for Medicare Annual (Subsequent) preventive examination. ? ?Virtual Visit via Telephone Note ? ?I connected with  Dana Sparks on 12/22/21 at  8:45 AM EDT by telephone and verified that I am speaking with the correct person using two identifiers. ? ?Location: ?Patient: home ?Provider: [facility] ?Persons participating in the virtual visit: patient/Nurse Health Advisor ?  ?I discussed the limitations, risks, security and privacy concerns of performing an evaluation and management service by telephone and the availability of in person appointments. The patient expressed understanding and agreed to proceed. ? ?Interactive audio and video telecommunications were attempted between this nurse and patient, however failed, due to patient having technical difficulties OR patient did not have access to video capability.  We continued and completed visit with audio only. ? ?Some vital signs may be absent or patient reported.  ? ?[name], LPN ? ? ?Review of Systems    ? ?Cardiac Risk Factors include: advanced age (>34mn, >>67women);diabetes mellitus;dyslipidemia;hypertension;obesity (BMI >30kg/m2) ? ?   ?Objective:  ?  ?There were no vitals filed for this visit. ?There is no height or weight on file to calculate BMI. ? ? ?  12/22/2021  ?  8:52 AM 12/14/2020  ?  8:42 AM 12/11/2019  ?  9:13 AM 03/24/2019  ?  8:09 AM 09/02/2018  ?  7:44 AM 08/16/2018  ?  7:49 AM 07/15/2018  ? 12:25 PM  ?Advanced Directives  ?Does Patient Have a Medical Advance Directive? No No No No No No No  ?Would patient like information on creating a medical advance directive? No - Patient declined Yes (MAU/Ambulatory/Procedural Areas - Information given) Yes (MAU/Ambulatory/Procedural Areas - Information given) No - Patient declined No - Patient declined No - Patient declined No - Patient declined  ? ? ?Current Medications (verified) ?Outpatient Encounter Medications as of 12/22/2021  ?Medication Sig  ?  ACCU-CHEK FASTCLIX LANCETS MISC   ? ACCU-CHEK SMARTVIEW test strip Check fsbs two times daily  ?DMII  ? Alcohol Swabs (B-D SINGLE USE SWABS REGULAR) PADS 1 each by Does not apply route 4 (four) times daily.  ? allopurinol (ZYLOPRIM) 100 MG tablet TAKE 1 TABLET EVERY DAY  ? amLODipine (NORVASC) 2.5 MG tablet TAKE 1 TABLET EVERY DAY  ? aspirin EC 81 MG tablet Take 81 mg by mouth every morning.   ? BD INSULIN SYRINGE U/F 31G X 5/16" 0.3 ML MISC   ? blood glucose meter kit and supplies KIT Dispense based on patient and insurance preference. Use up to four times daily as directed. (FOR ICD-9 250.00, 250.01).  ? Cholecalciferol (VITAMIN D-3 PO) Take 1,000 Units by mouth daily.   ? CINNAMON PO Take 1,000 mg by mouth daily.  ? cloNIDine (CATAPRES) 0.1 MG tablet TAKE 1 TABLET (0.1 MG TOTAL) BY MOUTH EVERY EVENING.  ? Coenzyme Q10 (CO Q-10) 100 MG CAPS TAKE 1 CAPSULE EVERY DAY  ? cyclobenzaprine (FLEXERIL) 5 MG tablet TAKE 1 TO 2 TABLETS AT BEDTIME AS NEEDED FOR MUSCLE SPASM(S). MAY INCREASE TO 2 TABS PENDING RESPONSE.  ? DENTA 5000 PLUS 1.1 % CREA dental cream   ? estradiol (ESTRACE) 0.5 MG tablet TAKE 1 TABLET (0.5 MG TOTAL) BY MOUTH DAILY.  ? Ferrous Sulfate (IRON) 325 (65 Fe) MG TABS Take 325 mg by mouth daily. Nature made brand-  ? fluticasone (FLONASE) 50 MCG/ACT nasal spray Place 2 sprays into both nostrils daily.  ? fluticasone-salmeterol (ADVAIR DISKUS) 250-50 MCG/ACT AEPB  Inhale 1 puff into the lungs in the morning and at bedtime.  ? Insulin Glargine (BASAGLAR KWIKPEN) 100 UNIT/ML SOPN Inject 60 Units into the skin at bedtime.  ? Insulin Pen Needle (FIFTY50 PEN NEEDLES) 31G X 5 MM MISC Use once daily  ? ipratropium (ATROVENT) 0.03 % nasal spray Place 2 sprays into both nostrils every 12 (twelve) hours.  ? isosorbide mononitrate (IMDUR) 60 MG 24 hr tablet TAKE 1 TABLET EVERY DAY  ? loratadine (CLARITIN) 10 MG tablet TAKE 1 TABLET EVERY DAY  ? losartan (COZAAR) 50 MG tablet TAKE 1 TABLET EVERY DAY  ? metoCLOPramide  (REGLAN) 5 MG tablet Take 1 tablet (5 mg total) by mouth 3 (three) times daily before meals. TAKE 1 TO 2 TABLETS FOUR TIMES DAILY BEFORE MEALS  AND AT BEDTIME  ? metoprolol succinate (TOPROL-XL) 50 MG 24 hr tablet TAKE 1 TABLET EVERY DAY  ? montelukast (SINGULAIR) 10 MG tablet Take 1 tablet (10 mg total) by mouth every evening.  ? pantoprazole (PROTONIX) 40 MG tablet TAKE 1 TABLET (40 MG TOTAL) BY MOUTH EVERY MORNING.  ? rosuvastatin (CRESTOR) 20 MG tablet TAKE 1 TABLET EVERY DAY  ? SYNJARDY XR 12.01-999 MG TB24 Take 2 tablets by mouth daily.  ? TRULICITY 9.37 DS/2.8JG SOPN   ? venlafaxine XR (EFFEXOR-XR) 75 MG 24 hr capsule TAKE 1 CAPSULE EVERY DAY  ? [DISCONTINUED] Dulaglutide 1.5 MG/0.5ML SOPN Inject 1.5 mg into the skin once a week.  ? ?No facility-administered encounter medications on file as of 12/22/2021.  ? ? ?Allergies (verified) ?Codeine, Contrast media [iodinated contrast media], Lisinopril, and Sulfa antibiotics  ? ?History: ?Past Medical History:  ?Diagnosis Date  ? Allergy   ? Anxiety   ? Arthritis   ? right knee  ? CAD (coronary artery disease)   ? COPD (chronic obstructive pulmonary disease) ([place])   ? Depression   ? Diabetes mellitus without complication ([place])   ? type 2  ? Diverticulitis   ? Gastroparesis   ? GERD (gastroesophageal reflux disease)   ? Gout   ? Hyperlipemia   ? Hypertension   ? Positive H. pylori test   ? Renal insufficiency   ? Sleep apnea   ? CPAP  ? Thrombocytosis 01/28/2015  ? Tubular adenoma of colon 01/20/14  ? ?Past Surgical History:  ?Procedure Laterality Date  ? ABDOMINAL HYSTERECTOMY    ? total  ? CATARACT EXTRACTION    ? CATARACT EXTRACTION W/PHACO Left 03/24/2019  ? Procedure: CATARACT EXTRACTION PHACO AND INTRAOCULAR LENS PLACEMENT ([place])  LEFT DIABETIC;  Surgeon: Eulogio Bear, MD;  Location: [facility];  Service: Ophthalmology;  Laterality: Left;  Diabetic - insulin and oral meds ?sleep apnea  ? COLONOSCOPY  01/2014  ? sigmoid diverticulosis. desc colon TA,  hyperplastic polyp  ? COLONOSCOPY WITH PROPOFOL N/A 09/02/2018  ? Procedure: COLONOSCOPY WITH PROPOFOL;  Surgeon: Lucilla Lame, MD;  Location: [facility];  Service: Endoscopy;  Laterality: N/A;  ? CORONARY ANGIOPLASTY WITH STENT PLACEMENT    ? ESOPHAGOGASTRODUODENOSCOPY N/A 02/16/2015  ? negative h pylori, focal gastic intestinal metaplasia  ? ESOPHAGOGASTRODUODENOSCOPY (EGD) WITH PROPOFOL N/A 09/02/2018  ? Procedure: ESOPHAGOGASTRODUODENOSCOPY (EGD) WITH BIOPSIES;  Surgeon: Lucilla Lame, MD;  Location: [facility];  Service: Endoscopy;  Laterality: N/A;  diabetic -insulin and oral meds ?sleep apnea  ? TOTAL KNEE ARTHROPLASTY Right   ? ?Family History  ?Problem Relation Age of Onset  ? Stroke Sister   ? Hyperlipidemia Sister   ? Alcohol abuse Brother   ?  Diabetes Brother   ? Stroke Brother   ? Hypertension Mother   ? Diabetes Mother   ? Heart disease Mother   ? Diabetes Father   ? Heart disease Father   ? Hypertension Father   ? Hypertension Sister   ? Cancer Maternal Grandmother   ?     Unsure  ? Diabetes Maternal Grandfather   ? Colon cancer Neg Hx   ? Liver disease Neg Hx   ? Breast cancer Neg Hx   ? ?Social History  ? ?Socioeconomic History  ? Marital status: Married  ?  Spouse name: Fritz Pickerel  ? Number of children: 2  ? Years of education: some college  ? Highest education level: 12th grade  ?Occupational History  ? Occupation: Disabled  ?Tobacco Use  ? Smoking status: Former  ?  Packs/day: 1.00  ?  Years: 40.00  ?  Pack years: 40.00  ?  Types: Cigarettes  ?  Start date: 09/04/1972  ?  Quit date: 12/30/2012  ?  Years since quitting: 8.9  ? Smokeless tobacco: Never  ? Tobacco comments:  ?  smoking cessation materials not required  ?Vaping Use  ? Vaping Use: Never used  ?Substance and Sexual Activity  ? Alcohol use: No  ?  Alcohol/week: 0.0 standard drinks  ? Drug use: No  ? Sexual activity: Not Currently  ?Other Topics Concern  ? Not on file  ?Social History Narrative  ? Married - current 43rd,   ? Two  children from first marriage  ? He has 4 children from previous marriage  ? ?Social Determinants of Health  ? ?Financial Resource Strain: Low Risk   ? Difficulty of Paying Living Expenses: Not very hard  ?F

## 2021-12-27 ENCOUNTER — Ambulatory Visit: Payer: Medicare PPO | Admitting: Physician Assistant

## 2021-12-27 ENCOUNTER — Telehealth (INDEPENDENT_AMBULATORY_CARE_PROVIDER_SITE_OTHER): Payer: Medicare PPO | Admitting: Physician Assistant

## 2021-12-27 ENCOUNTER — Encounter: Payer: Self-pay | Admitting: Physician Assistant

## 2021-12-27 VITALS — Ht 65.5 in | Wt 212.0 lb

## 2021-12-27 DIAGNOSIS — J069 Acute upper respiratory infection, unspecified: Secondary | ICD-10-CM

## 2021-12-27 NOTE — Progress Notes (Signed)
? ? ?     Virtual Visit Note  ? ?Name: Dana Sparks   MRN: [id number]    DOB: 06-26-1951   Date:12/27/2021 ? ?Today's Provider: Talitha Givens, MHS, PA-C ?Introduced myself to the patient as a Journalist, newspaper and provided education on APPs in clinical practice.  ?      ? ?Subjective ? ?Chief Complaint ? ?Chief Complaint  ?Patient presents with  ? Sinusitis  ? ? ?I connected with  Dana Sparks on 12/27/21 at  2:40 PM EDT by telephone and verified that I am speaking with the correct person using two identifiers. ? ?I discussed the limitations, risks, security and privacy concerns of performing an evaluation and management service by telephone and the availability of in person appointments. Staff also discussed with the patient that there may be a patient responsible charge related to this service. Patient agreed on having a virtual visit  ?Patient Location: At home, [place], [place] ?Provider Location: [facility], [place], [place]  ? ? ?HPI ?She states she has had an intense headache since last night- kept waking up every hour due to pain  ?States her symptoms started yesterday ?She has not taken an at-home COVID test  ?She denies recent sick contacts ?She has taken Tylenol, Mucinex - unsure if this has provided relief ?She reports she also is having runny nose  ?She is unable to take her temp  ? ?States her BP is very well controlled and typically runs <120/ <80 ? ? ? ?Patient Active Problem List  ? Diagnosis Date Noted  ? Emphysema lung ([place]) 07/09/2020  ? Hypercalcemia 07/05/2020  ? Chronic bronchitis ([place]) 02/22/2020  ? Disease of stomach   ? Hx of colonic polyps   ? Hepatomegaly 07/23/2018  ? Fatty liver 07/23/2018  ? Aortic atherosclerosis ([place]) 07/09/2018  ? Diabetes mellitus type 2, controlled, with complications ([place]) 48/18/5631  ? Nasal polyp 09/03/2017  ? Lipoma of back 09/03/2017  ? Claudication ([place]) 06/04/2017  ? LVH (left ventricular hypertrophy) 02/27/2017  ? Decreased cardiac ejection fraction 02/27/2017  ?  Left hand weakness 10/12/2016  ? Elevated antinuclear antibody (ANA) level 04/27/2016  ? Angina pectoris ([place]) 11/01/2015  ? Well controlled type 2 diabetes mellitus with gastroparesis ([place]) 06/22/2015  ? Low TSH level 06/22/2015  ? Iron deficiency anemia 04/07/2015  ? Intestinal metaplasia of gastric mucosa 03/11/2015  ? CAD (coronary artery disease), native coronary artery 02/24/2015  ? History of coronary artery stent placement 02/24/2015  ? Chronic kidney disease, stage 3, mod decreased GFR ([facility]) 02/24/2015  ? Menopause 02/24/2015  ? History of Helicobacter pylori infection 02/24/2015  ? Mild mitral insufficiency 02/24/2015  ? Mild tricuspid insufficiency 02/24/2015  ? 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 ([place]) 02/24/2015  ? Controlled gout 02/24/2015  ? Depression, major, recurrent, mild ([place]) 02/24/2015  ? Mild pulmonary hypertension ([place]) 02/24/2015  ? Gastroesophageal reflux disease without esophagitis 02/24/2015  ? Perennial allergic rhinitis 02/24/2015  ? HLD (hyperlipidemia) 02/20/2015  ? Hypertension, benign 02/20/2015  ? Apnea, sleep 02/20/2015  ? Controlled diabetes mellitus with stage 3 chronic kidney disease, without long-term current use of insulin ([place]) 02/20/2015  ? ? ?Past Surgical History:  ?Procedure Laterality Date  ? ABDOMINAL HYSTERECTOMY    ? total  ? CATARACT EXTRACTION    ? CATARACT EXTRACTION W/PHACO Left 03/24/2019  ? Procedure: CATARACT EXTRACTION PHACO AND INTRAOCULAR LENS PLACEMENT ([place])  LEFT DIABETIC;  Surgeon: Eulogio Bear, MD;  Location: [facility];  Service:  Ophthalmology;  Laterality: Left;  Diabetic - insulin and oral meds ?sleep apnea  ? COLONOSCOPY  01/2014  ? sigmoid diverticulosis. desc colon TA, hyperplastic polyp  ? COLONOSCOPY WITH PROPOFOL N/A 09/02/2018  ? Procedure: COLONOSCOPY WITH PROPOFOL;  Surgeon: Lucilla Lame, MD;  Location: [facility];  Service: Endoscopy;  Laterality: N/A;  ? CORONARY ANGIOPLASTY WITH STENT  PLACEMENT    ? ESOPHAGOGASTRODUODENOSCOPY N/A 02/16/2015  ? negative h pylori, focal gastic intestinal metaplasia  ? ESOPHAGOGASTRODUODENOSCOPY (EGD) WITH PROPOFOL N/A 09/02/2018  ? Procedure: ESOPHAGOGASTRODUODENOSCOPY (EGD) WITH BIOPSIES;  Surgeon: Lucilla Lame, MD;  Location: [facility];  Service: Endoscopy;  Laterality: N/A;  diabetic -insulin and oral meds ?sleep apnea  ? TOTAL KNEE ARTHROPLASTY Right   ? ? ?Family History  ?Problem Relation Age of Onset  ? Stroke Sister   ? Hyperlipidemia Sister   ? Alcohol abuse Brother   ? Diabetes Brother   ? Stroke Brother   ? Hypertension Mother   ? Diabetes Mother   ? Heart disease Mother   ? Diabetes Father   ? Heart disease Father   ? Hypertension Father   ? Hypertension Sister   ? Cancer Maternal Grandmother   ?     Unsure  ? Diabetes Maternal Grandfather   ? Colon cancer Neg Hx   ? Liver disease Neg Hx   ? Breast cancer Neg Hx   ? ? ?Social History  ? ?Socioeconomic History  ? Marital status: Married  ?  Spouse name: Dana Sparks  ? Number of children: 2  ? Years of education: some college  ? Highest education level: 12th grade  ?Occupational History  ? Occupation: Disabled  ?Tobacco Use  ? Smoking status: Former  ?  Packs/day: 1.00  ?  Years: 40.00  ?  Pack years: 40.00  ?  Types: Cigarettes  ?  Start date: 09/04/1972  ?  Quit date: 12/30/2012  ?  Years since quitting: 8.9  ? Smokeless tobacco: Never  ? Tobacco comments:  ?  smoking cessation materials not required  ?Vaping Use  ? Vaping Use: Never used  ?Substance and Sexual Activity  ? Alcohol use: No  ?  Alcohol/week: 0.0 standard drinks  ? Drug use: No  ? Sexual activity: Not Currently  ?Other Topics Concern  ? Not on file  ?Social History Narrative  ? Married - current 72rd,   ? Two children from first marriage  ? He has 4 children from previous marriage  ? ?Social Determinants of Health  ? ?Financial Resource Strain: Low Risk   ? Difficulty of Paying Living Expenses: Not very hard  ?Food Insecurity: No Food  Insecurity  ? Worried About Charity fundraiser in the Last Year: Never true  ? Ran Out of Food in the Last Year: Never true  ?Transportation Needs: No Transportation Needs  ? Lack of Transportation (Medical): No  ? Lack of Transportation (Non-Medical): No  ?Physical Activity: Sufficiently Active  ? Days of Exercise per Week: 3 days  ? Minutes of Exercise per Session: 60 min  ?Stress: No Stress Concern Present  ? Feeling of Stress : Not at all  ?Social Connections: Socially Integrated  ? Frequency of Communication with Friends and Family: More than three times a week  ? Frequency of Social Gatherings with Friends and Family: More than three times a week  ? Attends Religious Services: More than 4 times per year  ? Active Member of Clubs or Organizations: Yes  ? Attends Club  or Organization Meetings: More than 4 times per year  ? Marital Status: Married  ?Intimate Partner Violence: Not At Risk  ? Fear of Current or Ex-Partner: No  ? Emotionally Abused: No  ? Physically Abused: No  ? Sexually Abused: No  ? ? ? ?Current Outpatient Medications:  ?  ACCU-CHEK FASTCLIX LANCETS MISC, , Disp: , Rfl:  ?  ACCU-CHEK SMARTVIEW test strip, Check fsbs two times daily  DMII, Disp: 100 each, Rfl: 6 ?  Alcohol Swabs (B-D SINGLE USE SWABS REGULAR) PADS, 1 each by Does not apply route 4 (four) times daily., Disp: 200 each, Rfl: 2 ?  allopurinol (ZYLOPRIM) 100 MG tablet, TAKE 1 TABLET EVERY DAY, Disp: 90 tablet, Rfl: 1 ?  amLODipine (NORVASC) 2.5 MG tablet, TAKE 1 TABLET EVERY DAY, Disp: 90 tablet, Rfl: 1 ?  aspirin EC 81 MG tablet, Take 81 mg by mouth every morning. , Disp: , Rfl:  ?  BD INSULIN SYRINGE U/F 31G X 5/16" 0.3 ML MISC, , Disp: , Rfl:  ?  blood glucose meter kit and supplies KIT, Dispense based on patient and insurance preference. Use up to four times daily as directed. (FOR ICD-9 250.00, 250.01)., Disp: 1 each, Rfl: 0 ?  Cholecalciferol (VITAMIN D-3 PO), Take 1,000 Units by mouth daily. , Disp: , Rfl:  ?  CINNAMON PO, Take  1,000 mg by mouth daily., Disp: , Rfl:  ?  cloNIDine (CATAPRES) 0.1 MG tablet, TAKE 1 TABLET (0.1 MG TOTAL) BY MOUTH EVERY EVENING., Disp: 90 tablet, Rfl: 1 ?  Coenzyme Q10 (CO Q-10) 100 MG CAPS, TAKE 1 C

## 2021-12-27 NOTE — Patient Instructions (Addendum)
Based on your described symptoms and the duration of symptoms it is likely that you have a viral upper respiratory infection (often called a "cold") ? ?Symptoms can last for 3-10 days with lingering cough and intermittent symptoms lasting weeks after that. ? ?The goal of treatment at this time is to reduce your symptoms and discomfort  ? ? ?You can use over the counter medications such as Dayquil/Nyquil, AlkaSeltzer formulations, etc to provide further relief of symptoms according to the manufacturer's instructions  ?If preferred you can use Coricidin to manage your symptoms rather than those medications mentioned above.  ? ?Please continue to use your antihistamines: the Flonase, Singulair and Claritin as these can help with the runny nose and sinus discomfort ?Stay well hydrated and rest as you are able ?If you need to, you can also use nasal saline spray and a humidifier to assist with nighttime symptoms ? ? ?If your symptoms do not improve or become worse in the next 5-10 days please make an apt at the office so we can see you  ?Go to the ER if you begin to have more serious symptoms such as shortness of breath, trouble breathing, loss of consciousness, swelling around the eyes, high fever, severe lasting headaches, vision changes or neck pain/stiffness.  ? ?

## 2022-01-03 ENCOUNTER — Other Ambulatory Visit: Payer: Self-pay | Admitting: Family Medicine

## 2022-01-03 DIAGNOSIS — J302 Other seasonal allergic rhinitis: Secondary | ICD-10-CM

## 2022-01-11 ENCOUNTER — Telehealth: Payer: Medicare HMO

## 2022-02-08 ENCOUNTER — Other Ambulatory Visit: Payer: Self-pay

## 2022-02-10 ENCOUNTER — Encounter: Payer: Self-pay | Admitting: Internal Medicine

## 2022-02-10 ENCOUNTER — Ambulatory Visit (INDEPENDENT_AMBULATORY_CARE_PROVIDER_SITE_OTHER): Payer: Medicare PPO | Admitting: Internal Medicine

## 2022-02-10 ENCOUNTER — Other Ambulatory Visit: Payer: Self-pay | Admitting: Family Medicine

## 2022-02-10 VITALS — BP 132/78 | HR 85 | Temp 97.5°F | Resp 16 | Wt 214.4 lb

## 2022-02-10 DIAGNOSIS — S39012A Strain of muscle, fascia and tendon of lower back, initial encounter: Secondary | ICD-10-CM

## 2022-02-10 MED ORDER — TIZANIDINE HCL 4 MG PO TABS: 4.0000 mg | ORAL_TABLET | Freq: Four times a day (QID) | ORAL | 0 refills | Status: DC | PRN

## 2022-02-10 MED ORDER — NAPROXEN 500 MG PO TABS: 500.0000 mg | ORAL_TABLET | Freq: Two times a day (BID) | ORAL | 0 refills | Status: AC

## 2022-02-10 MED ORDER — LIDOCAINE 4 % EX PTCH: 1.0000 | MEDICATED_PATCH | CUTANEOUS | 0 refills | Status: DC

## 2022-02-10 NOTE — Progress Notes (Signed)
Acute Office Visit  Subjective:     Patient ID: Dana Sparks, female    DOB: 05/13/1951, 71 y.o.   MRN: [id number]  Chief Complaint  Patient presents with   Back Pain    Left side, states twisted wrong while mopping    HPI Patient is in today for back pain.   BACK PAIN Duration: 6 days Mechanism of injury:  twisted wrong while mopping  Location: Left and low back Onset: sudden Quality: aching, sore, and stabbing Frequency: constant Radiation: none Aggravating factors: bending and prolonged sitting Alleviating factors: nothing Status: worse Treatments attempted: heat and APAP  Relief with NSAIDs?: No NSAIDs Taken Nighttime pain:  yes Paresthesias / decreased sensation:  no Bowel / bladder incontinence:  no Fevers:  no Dysuria / urinary frequency:  no   Review of Systems  Constitutional:  Negative for chills and fever.  Genitourinary:  Negative for dysuria and hematuria.  Musculoskeletal:  Positive for back pain.  Neurological:  Negative for sensory change and weakness.        Objective:    BP 132/78   Pulse 85   Temp (!) 97.5 F (36.4 C)   Resp 16   Wt 214 lb 6.4 oz (97.3 kg)   SpO2 94%   BMI 35.14 kg/m  BP Readings from Last 3 Encounters:  02/10/22 132/78  11/02/21 128/74  06/29/21 122/70   Wt Readings from Last 3 Encounters:  02/10/22 214 lb 6.4 oz (97.3 kg)  12/27/21 212 lb (96.2 kg)  11/02/21 212 lb 4.8 oz (96.3 kg)      Physical Exam Constitutional:      Appearance: Normal appearance.  HENT:     Head: Normocephalic and atraumatic.  Eyes:     Conjunctiva/sclera: Conjunctivae normal.  Cardiovascular:     Rate and Rhythm: Normal rate and regular rhythm.  Pulmonary:     Effort: Pulmonary effort is normal.     Breath sounds: Normal breath sounds.  Musculoskeletal:        General: Tenderness present.     Comments: Left paraspinal muscles hypertrophic at the level of T11-L2. Good ROM with flexion and extension. Decreased ROM with left  side-bending and left rotation   Skin:    General: Skin is warm and dry.  Neurological:     General: No focal deficit present.     Mental Status: She is alert. Mental status is at baseline.  Psychiatric:        Mood and Affect: Mood normal.        Behavior: Behavior normal.     No results found for any visits on 02/10/22.      Assessment & Plan:   1. Strain of lumbar region, initial encounter: Treat with anti-inflammatory Naproxen 500 mg BID x 5 days. Zanaflex 4 mg PRN muscle spasms. Moist heat, rest and stretches in a few days when some of the tightness and pain has resolved. Lidocaine patches sent as well. Follow up if symptoms worsen or fail to improve.    - naproxen (NAPROSYN) 500 MG tablet; Take 1 tablet (500 mg total) by mouth 2 (two) times daily with a meal for 5 days.  Dispense: 10 tablet; Refill: 0 - tiZANidine (ZANAFLEX) 4 MG tablet; Take 1 tablet (4 mg total) by mouth every 6 (six) hours as needed for muscle spasms.  Dispense: 30 tablet; Refill: 0 - lidocaine (HM LIDOCAINE PATCH) 4 %; Place 1 patch onto the skin daily.  Dispense: 30 patch; Refill: 0  Return if symptoms worsen or fail to improve.  Teodora Medici, DO

## 2022-02-10 NOTE — Patient Instructions (Addendum)
It was great seeing you today!  Plan discussed at today's visit: -Naproxen take twice a day for 5 days -Muscle relaxer as needed, don't take and drive, can make you sleepy -Lidocaine patches sent to pharmacy as well   Follow up in: as needed   Take care and let us know if you have any questions or concerns prior to your next visit.  Dr. Rosana Berger  Lumbar Strain A lumbar strain, which is sometimes called a low-back strain, is a stretch or tear in a muscle or the strong cords of tissue that attach muscle to bone (tendons) in the lower back (lumbar spine). This type of injury occurs when muscles or tendons are torn or are stretched beyond their limits. Lumbar strains can range from mild to severe. Mild strains may involve stretching a muscle or tendon without tearing it. These may heal in 1-2 weeks. More severe strains involve tearing of muscle fibers or tendons. These will cause more pain and may take 6-8 weeks to heal. What are the causes? This condition may be caused by: Trauma, such as a fall or a hit to the body. Twisting or overstretching the back. This may result from doing activities that need a lot of energy, such as lifting heavy objects. What increases the risk? This injury is more common in: Athletes. People with obesity. People who do repeated lifting, bending, or other movements that involve their back. What are the signs or symptoms? Symptoms of this condition may include: Sharp or dull pain in the lower back that does not go away. The pain may extend to the buttocks. Stiffness or limited range of motion. Sudden muscle tightening (spasms). How is this diagnosed? This condition may be diagnosed based on: Your symptoms. Your medical history. A physical exam. Imaging tests, such as: X-rays. MRI. How is this treated? Treatment for this condition may include: Rest. Applying heat and cold to the affected area. Over-the-counter medicines to help relieve pain and  inflammation, such as NSAIDs. Prescription pain medicine and muscle relaxants may be needed for a short time. Physical therapy. Follow these instructions at home: Managing pain, stiffness, and swelling     If directed, put ice on the injured area during the first 24 hours after your injury. Put ice in a plastic bag. Place a towel between your skin and the bag. Leave the ice on for 20 minutes, 2-3 times a day. If directed, apply heat to the affected area as often as told by your health care provider. Use the heat source that your health care provider recommends, such as a moist heat pack or a heating pad. Place a towel between your skin and the heat source. Leave the heat on for 20-30 minutes. Remove the heat if your skin turns bright red. This is especially important if you are unable to feel pain, heat, or cold. You may have a greater risk of getting burned. Activity Rest and return to your normal activities as told by your health care provider. Ask your health care provider what activities are safe for you. Do exercises as told by your health care provider. Medicines Take over-the-counter and prescription medicines only as told by your health care provider. Ask your health care provider if the medicine prescribed to you: Requires you to avoid driving or using heavy machinery. Can cause constipation. You may need to take these actions to prevent or treat constipation: Drink enough fluid to keep your urine pale yellow. Take over-the-counter or prescription medicines. Eat foods that are  high in fiber, such as beans, whole grains, and fresh fruits and vegetables. Limit foods that are high in fat and processed sugars, such as fried or sweet foods. Injury prevention To prevent a future low-back injury: Always warm up properly before physical activity or sports. Cool down and stretch after being active. Use correct form when playing sports and lifting heavy objects. Bend your knees before  you lift heavy objects. Use good posture when sitting and standing. Stay physically fit and keep a healthy weight. Do at least 150 minutes of moderate-intensity exercise each week, such as brisk walking or water aerobics. Do strength exercises at least 2 times each week.  General instructions Do not use any products that contain nicotine or tobacco, such as cigarettes, e-cigarettes, and chewing tobacco. If you need help quitting, ask your health care provider. Keep all follow-up visits as told by your health care provider. This is important. Contact a health care provider if: Your back pain does not improve after 6 weeks of treatment. Your symptoms get worse. Get help right away if: Your back pain is severe. You are unable to stand or walk. You develop pain in your legs. You develop weakness in your buttocks or legs. You have difficulty controlling when you urinate or when you have a bowel movement. You have frequent, painful, or bloody urination. You have a temperature over 101.31F (38.3C) Summary A lumbar strain, which is sometimes called a low-back strain, is a stretch or tear in a muscle or the strong cords of tissue that attach muscle to bone (tendons) in the lower back (lumbar spine). This type of injury occurs when muscles or tendons are torn or are stretched beyond their limits. Rest and return to your normal activities as told by your health care provider. If directed, apply heat and ice to the affected area as often as told by your health care provider. Take over-the-counter and prescription medicines only as told by your health care provider. Contact a health care provider if you have new or worsening symptoms. This information is not intended to replace advice given to you by your health care provider. Make sure you discuss any questions you have with your health care provider. Document Revised: 06/23/2021 Document Reviewed: 06/23/2021 Elsevier Patient Education  [place] or Strain Rehab Ask your health care provider which exercises are safe for you. Do exercises exactly as told by your health care provider and adjust them as directed. It is normal to feel mild stretching, pulling, tightness, or discomfort as you do these exercises. Stop right away if you feel sudden pain or your pain gets worse. Do not begin these exercises until told by your health care provider. Stretching and range-of-motion exercises These exercises warm up your muscles and joints and improve the movement and flexibility of your back. These exercises also help to relieve pain, numbness, and tingling. Lumbar rotation  Lie on your back on a firm bed or the floor with your knees bent. Straighten your arms out to your sides so each arm forms a 90-degree angle (right angle) with a side of your body. Slowly move (rotate) both of your knees to one side of your body until you feel a stretch in your lower back (lumbar). Try not to let your shoulders lift off the floor. Hold this position for __________ seconds. Tense your abdominal muscles and slowly move your knees back to the starting position. Repeat this exercise on the other side of your body.  Repeat __________ times. Complete this exercise __________ times a day. Single knee to chest  Lie on your back on a firm bed or the floor with both legs straight. Bend one of your knees. Use your hands to move your knee up toward your chest until you feel a gentle stretch in your lower back and buttock. Hold your leg in this position by holding on to the front of your knee. Keep your other leg as straight as possible. Hold this position for __________ seconds. Slowly return to the starting position. Repeat with your other leg. Repeat __________ times. Complete this exercise __________ times a day. Prone extension on elbows  Lie on your abdomen on a firm bed or the floor (prone position). Prop yourself up on your  elbows. Use your arms to help lift your chest up until you feel a gentle stretch in your abdomen and your lower back. This will place some of your body weight on your elbows. If this is uncomfortable, try stacking pillows under your chest. Your hips should stay down, against the surface that you are lying on. Keep your hip and back muscles relaxed. Hold this position for __________ seconds. Slowly relax your upper body and return to the starting position. Repeat __________ times. Complete this exercise __________ times a day. Strengthening exercises These exercises build strength and endurance in your back. Endurance is the ability to use your muscles for a long time, even after they get tired. Pelvic tilt This exercise strengthens the muscles that lie deep in the abdomen. Lie on your back on a firm bed or the floor with your legs extended. Bend your knees so they are pointing toward the ceiling and your feet are flat on the floor. Tighten your lower abdominal muscles to press your lower back against the floor. This motion will tilt your pelvis so your tailbone points up toward the ceiling instead of pointing to your feet or the floor. To help with this exercise, you may place a small towel under your lower back and try to push your back into the towel. Hold this position for __________ seconds. Let your muscles relax completely before you repeat this exercise. Repeat __________ times. Complete this exercise __________ times a day. Alternating arm and leg raises  Get on your hands and knees on a firm surface. If you are on a hard floor, you may want to use padding, such as an exercise mat, to cushion your knees. Line up your arms and legs. Your hands should be directly below your shoulders, and your knees should be directly below your hips. Lift your left leg behind you. At the same time, raise your right arm and straighten it in front of you. Do not lift your leg higher than your hip. Do not  lift your arm higher than your shoulder. Keep your abdominal and back muscles tight. Keep your hips facing the ground. Do not arch your back. Keep your balance carefully, and do not hold your breath. Hold this position for __________ seconds. Slowly return to the starting position. Repeat with your right leg and your left arm. Repeat __________ times. Complete this exercise __________ times a day. Abdominal set with straight leg raise  Lie on your back on a firm bed or the floor. Bend one of your knees and keep your other leg straight. Tense your abdominal muscles and lift your straight leg up, 4-6 inches (10-15 cm) off the ground. Keep your abdominal muscles tight and hold this position for __________  seconds. Do not hold your breath. Do not arch your back. Keep it flat against the ground. Keep your abdominal muscles tense as you slowly lower your leg back to the starting position. Repeat with your other leg. Repeat __________ times. Complete this exercise __________ times a day. Single leg lower with bent knees Lie on your back on a firm bed or the floor. Tense your abdominal muscles and lift your feet off the floor, one foot at a time, so your knees and hips are bent in 90-degree angles (right angles). Your knees should be over your hips and your lower legs should be parallel to the floor. Keeping your abdominal muscles tense and your knee bent, slowly lower one of your legs so your toe touches the ground. Lift your leg back up to return to the starting position. Do not hold your breath. Do not let your back arch. Keep your back flat against the ground. Repeat with your other leg. Repeat __________ times. Complete this exercise __________ times a day. Posture and body mechanics Good posture and healthy body mechanics can help to relieve stress in your body's tissues and joints. Body mechanics refers to the movements and positions of your body while you do your daily activities.  Posture is part of body mechanics. Good posture means: Your spine is in its natural S-curve position (neutral). Your shoulders are pulled back slightly. Your head is not tipped forward (neutral). Follow these guidelines to improve your posture and body mechanics in your everyday activities. Standing  When standing, keep your spine neutral and your feet about hip-width apart. Keep a slight bend in your knees. Your ears, shoulders, and hips should line up. When you do a task in which you stand in one place for a long time, place one foot up on a stable object that is 2-4 inches (5-10 cm) high, such as a footstool. This helps keep your spine neutral. Sitting  When sitting, keep your spine neutral and keep your feet flat on the floor. Use a footrest, if necessary, and keep your thighs parallel to the floor. Avoid rounding your shoulders, and avoid tilting your head forward. When working at a desk or a computer, keep your desk at a height where your hands are slightly lower than your elbows. Slide your chair under your desk so you are close enough to maintain good posture. When working at a computer, place your monitor at a height where you are looking straight ahead and you do not have to tilt your head forward or downward to look at the screen. Resting When lying down and resting, avoid positions that are most painful for you. If you have pain with activities such as sitting, bending, stooping, or squatting, lie in a position in which your body does not bend very much. For example, avoid curling up on your side with your arms and knees near your chest (fetal position). If you have pain with activities such as standing for a long time or reaching with your arms, lie with your spine in a neutral position and bend your knees slightly. Try the following positions: Lying on your side with a pillow between your knees. Lying on your back with a pillow under your knees. Lifting  When lifting objects, keep  your feet at least shoulder-width apart and tighten your abdominal muscles. Bend your knees and hips and keep your spine neutral. It is important to lift using the strength of your legs, not your back. Do not lock your knees  straight out. Always ask for help to lift heavy or awkward objects. This information is not intended to replace advice given to you by your health care provider. Make sure you discuss any questions you have with your health care provider. Document Revised: 11/08/2020 Document Reviewed: 11/08/2020 Elsevier Patient Education  [place].

## 2022-02-10 NOTE — Telephone Encounter (Signed)
Pt stated that [facility] didn't have any patches and asked if her RX for lidocaine (HM LIDOCAINE PATCH) 4 % can be sent to  [place], [place] Phone:  [phone]  Fax:  [phone]    Instead/ please advise

## 2022-02-13 MED ORDER — LIDOCAINE 4 % EX PTCH: 1.0000 | MEDICATED_PATCH | CUTANEOUS | 0 refills | Status: DC

## 2022-02-13 NOTE — Telephone Encounter (Signed)
Requested Prescriptions  Pending Prescriptions Disp Refills  . lidocaine (HM LIDOCAINE PATCH) 4 % 30 patch 0    Sig: Place 1 patch onto the skin daily.     Off-Protocol Failed - 02/10/2022  4:01 PM      Failed - Medication not assigned to a protocol, review manually.      Passed - Valid encounter within last 12 months    Recent Outpatient Visits          3 days ago Strain of lumbar region, initial encounter   [place], DO   1 month ago Upper respiratory tract infection, unspecified type   [place], Dani Gobble, PA-C   3 months ago Controlled type 2 diabetes mellitus with complication, with long-term current use of insulin [facility])   [facility] Steele Sizer, MD   7 months ago Hoarseness   [facility] [place], Drue Stager, MD   10 months ago Non-intractable vomiting with nausea, unspecified vomiting type   [facility] Steele Sizer, MD      Future Appointments            In 2 months Steele Sizer, MD [facility], [facility]          Analgesics:  Topicals Failed - 02/10/2022  4:01 PM      Failed - Manual Review: Labs are only required if the patient has taken medication for more than 8 weeks.      Passed - PLT in normal range and within 360 days    Platelets  Date Value Ref Range Status  04/13/2021 283 140 - 400 Thousand/uL Final  11/01/2015 357 150 - 379 x10E3/uL Final         Passed - HGB in normal range and within 360 days    Hemoglobin  Date Value Ref Range Status  04/13/2021 14.4 11.7 - 15.5 g/dL Final  11/01/2015 12.8 11.1 - 15.9 g/dL Final         Passed - HCT in normal range and within 360 days    HCT  Date Value Ref Range Status  04/13/2021 44.1 35.0 - 45.0 % Final   Hematocrit  Date Value Ref Range Status  11/01/2015 38.5 34.0 - 46.6 % Final         Passed - Cr in normal range and within 360 days    Creat  Date Value  Ref Range Status  04/13/2021 0.85 0.50 - 1.05 mg/dL Final   Creatinine, Urine  Date Value Ref Range Status  10/13/2020 65 20 - 275 mg/dL Final         Passed - eGFR is 30 or above and within 360 days    GFR, Est African American  Date Value Ref Range Status  03/17/2019 79 > OR = 60 mL/min/1.62m Final   GFR, Est Non African American  Date Value Ref Range Status  03/17/2019 68 > OR = 60 mL/min/1.790mFinal   eGFR  Date Value Ref Range Status  04/13/2021 74 > OR = 60 mL/min/1.7361minal    Comment:    The eGFR is based on the CKD-EPI 2021 equation. To calculate  the new eGFR from a previous Creatinine or Cystatin C result, go to https://[email or web address].org/professionals/ kdoqi/[email or web address]          Passed - Patient is not pregnant      Passed - Valid encounter within last 12 months    Recent Outpatient Visits  3 days ago Strain of lumbar region, initial encounter   [place], DO   1 month ago Upper respiratory tract infection, unspecified type   [place], PA-C   3 months ago Controlled type 2 diabetes mellitus with complication, with long-term current use of insulin [facility])   [facility] Steele Sizer, MD   7 months ago Hoarseness   [facility] [place], Drue Stager, MD   10 months ago Non-intractable vomiting with nausea, unspecified vomiting type   [facility] Steele Sizer, MD      Future Appointments            In 2 months Ancil Boozer, Drue Stager, MD [facility], [facility]

## 2022-03-23 ENCOUNTER — Other Ambulatory Visit: Payer: Self-pay | Admitting: Family Medicine

## 2022-03-23 DIAGNOSIS — Z1231 Encounter for screening mammogram for malignant neoplasm of breast: Secondary | ICD-10-CM

## 2022-03-31 ENCOUNTER — Other Ambulatory Visit: Payer: Self-pay | Admitting: Family Medicine

## 2022-03-31 DIAGNOSIS — F325 Major depressive disorder, single episode, in full remission: Secondary | ICD-10-CM

## 2022-03-31 DIAGNOSIS — E785 Hyperlipidemia, unspecified: Secondary | ICD-10-CM

## 2022-03-31 DIAGNOSIS — I209 Angina pectoris, unspecified: Secondary | ICD-10-CM

## 2022-03-31 DIAGNOSIS — R61 Generalized hyperhidrosis: Secondary | ICD-10-CM

## 2022-03-31 DIAGNOSIS — N951 Menopausal and female climacteric states: Secondary | ICD-10-CM

## 2022-04-20 ENCOUNTER — Encounter: Payer: Self-pay | Admitting: Hematology and Oncology

## 2022-04-24 DIAGNOSIS — E1143 Type 2 diabetes mellitus with diabetic autonomic (poly)neuropathy: Secondary | ICD-10-CM | POA: Diagnosis not present

## 2022-04-24 DIAGNOSIS — B351 Tinea unguium: Secondary | ICD-10-CM | POA: Diagnosis not present

## 2022-04-27 ENCOUNTER — Ambulatory Visit
Admission: RE | Admit: 2022-04-27 | Discharge: 2022-04-27 | Disposition: A | Discharge status: Home / Self Care | Payer: Medicare PPO | Source: Ambulatory Visit | Attending: Family Medicine | Admitting: Family Medicine

## 2022-04-27 DIAGNOSIS — Z1231 Encounter for screening mammogram for malignant neoplasm of breast: Secondary | ICD-10-CM | POA: Diagnosis not present

## 2022-04-28 ENCOUNTER — Other Ambulatory Visit: Payer: Self-pay | Admitting: Family Medicine

## 2022-04-28 DIAGNOSIS — R928 Other abnormal and inconclusive findings on diagnostic imaging of breast: Secondary | ICD-10-CM

## 2022-04-28 DIAGNOSIS — R921 Mammographic calcification found on diagnostic imaging of breast: Secondary | ICD-10-CM

## 2022-04-28 DIAGNOSIS — N63 Unspecified lump in unspecified breast: Secondary | ICD-10-CM

## 2022-05-05 ENCOUNTER — Ambulatory Visit: Payer: Medicare PPO | Admitting: Family Medicine

## 2022-05-13 ENCOUNTER — Other Ambulatory Visit: Payer: Self-pay | Admitting: Family Medicine

## 2022-05-13 DIAGNOSIS — J410 Simple chronic bronchitis: Secondary | ICD-10-CM

## 2022-05-17 NOTE — Progress Notes (Deleted)
Name: Dana Sparks   MRN: [id number]    DOB: 06/22/1951   Date:05/17/2022       Progress Note  Subjective  Chief Complaint  Follow Up  HPI  Obesity:she is now BMI below 35, eating healthier, advised her to resume regular physical activity    Hyperlipidemia/Atherosclerosis of aorta: taking statin therapy, no muscle problems, reviewed last labs with patient , LDL 66 , continue medication, denies myalgias . We will recheck labs today   DMII with gastroparesis/CKI/neuropathy: she is taking medication as prescribed, seeing Dr. Gabriel Carina.  Her hgbA1C  was 7.8% to 8.2%, 8.2%  6.8 % 6.3 % 6.4 % down to 6.1 % S he is on Trulicity, Synjardi and Basaglar 60 units, she states since she started taking Synjardi at night, nausea and indigestion resolved. . She is getting it through [employer] and has been compliant with mediation. She has gastroparesis controlled with reglan before meals, dyslipidemia on crestor, on ARB for microalbuminuria. She has intermittent  polyphagia, she denies  polyuria or polydipsia. Eye exam is up to date. She has vascular disease and intermittent claudication. She is taking aspirin daily. She states glucose fasting as low as 70's- low 100's    HTN: she was on Norvasc 2.5 mg , Losartan and Toprol xl, bp is  at goal today  . No chest pain at this time.   Angina: CAD, her cardiologist is Dr. Clayborn Bigness. She takes statin therapy, aspirin , beta blocker and ARB  and Imdur , angina under control with medical management . She was on disability for heart disease for years but in social security now.  Last  Echo from 2016 showed decreased in EF 25-30% and global hypokines. She has not been active to know if SOB is still present ,  she does not have orthopnea, denies lower extremity edema .    FINDINGS: Myoview done 10/21/2020  EF 64 % Regional wall motion:  reveals normal myocardial thickening and wall  motion.  The overall quality of the study is good.    Artifacts noted: no   Left ventricular cavity: normal.   Perfusion Analysis:  SPECT images demonstrate homogeneous tracer  distribution throughout the myocardium. Defect type : Normal     OSA: she has been wearing CPAP only two night a week,  explained she has pulmonary hypertension needs to wear CPAP 7 nights for at least 4 hours    Major Depression in remission  she is doing well on Effexor, phq 9 is normal today, no side effects of medication . She states feeling well for a while but wants to continue medication since it also helps with hot flashes Continue current regiment    Hyperlipidemia: taking statin therapy, no muscle problems, reviewed last labs with patient , LDL below 60 now  , continue medication, denies myalgias , she has occasional cramps .   Chronic bronchitis:  she used to smoke 1 pack daily for 50 years, quit smoking in 2014 she is now on Advair and no longer a productive cough in am, no SOB or wheezing    PVD with claudication/Atherosclerosis of aorta: on aspirin and statin therapy,she states she has been lazy and stopped walking, discussed importance of resuming regular physical activity . She has a history of claudication but not walking far to have symptoms    Hot  Flashes: she is on Effexor , Premarin, black cohosh,  and clonidine, a few years ago her symptoms were severe when she stopped Effexor, stable  at this time  Patient Active Problem List   Diagnosis Date Noted   Emphysema lung ([place]) 07/09/2020   Hypercalcemia 07/05/2020   Chronic bronchitis ([place]) 02/22/2020   Disease of stomach    Hx of colonic polyps    Hepatomegaly 07/23/2018   Fatty liver 07/23/2018   Aortic atherosclerosis ([place]) 07/09/2018   Diabetes mellitus type 2, controlled, with complications ([place]) 16/06/9603   Nasal polyp 09/03/2017   Lipoma of back 09/03/2017   Claudication ([place]) 06/04/2017   LVH (left ventricular hypertrophy) 02/27/2017   Decreased cardiac ejection fraction 02/27/2017   Left hand weakness  10/12/2016   Elevated antinuclear antibody (ANA) level 04/27/2016   Angina pectoris ([place]) 11/01/2015   Well controlled type 2 diabetes mellitus with gastroparesis ([place]) 06/22/2015   Low TSH level 06/22/2015   Iron deficiency anemia 04/07/2015   Intestinal metaplasia of gastric mucosa 03/11/2015   CAD (coronary artery disease), native coronary artery 02/24/2015   History of coronary artery stent placement 02/24/2015   Chronic kidney disease, stage 3, mod decreased GFR ([place]) 02/24/2015   Menopause 54/05/8118   History of Helicobacter pylori infection 02/24/2015   Mild mitral insufficiency 02/24/2015   Mild tricuspid insufficiency 02/24/2015   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 ([place]) 02/24/2015   Controlled gout 02/24/2015   Depression, major, recurrent, mild ([place]) 02/24/2015   Mild pulmonary hypertension ([place]) 02/24/2015   Gastroesophageal reflux disease without esophagitis 02/24/2015   Perennial allergic rhinitis 02/24/2015   HLD (hyperlipidemia) 02/20/2015   Hypertension, benign 02/20/2015   Apnea, sleep 02/20/2015   Controlled diabetes mellitus with stage 3 chronic kidney disease, without long-term current use of insulin ([place]) 02/20/2015    Past Surgical History:  Procedure Laterality Date   ABDOMINAL HYSTERECTOMY     total   CATARACT EXTRACTION     CATARACT EXTRACTION W/PHACO Left 03/24/2019   Procedure: CATARACT EXTRACTION PHACO AND INTRAOCULAR LENS PLACEMENT ([place])  LEFT DIABETIC;  Surgeon: Eulogio Bear, MD;  Location: [facility];  Service: Ophthalmology;  Laterality: Left;  Diabetic - insulin and oral meds sleep apnea   COLONOSCOPY  01/2014   sigmoid diverticulosis. desc colon TA, hyperplastic polyp   COLONOSCOPY WITH PROPOFOL N/A 09/02/2018   Procedure: COLONOSCOPY WITH PROPOFOL;  Surgeon: Lucilla Lame, MD;  Location: [facility] [facility];  Service: Endoscopy;  Laterality: N/A;   CORONARY ANGIOPLASTY WITH STENT PLACEMENT      ESOPHAGOGASTRODUODENOSCOPY N/A 02/16/2015   negative h pylori, focal gastic intestinal metaplasia   ESOPHAGOGASTRODUODENOSCOPY (EGD) WITH PROPOFOL N/A 09/02/2018   Procedure: ESOPHAGOGASTRODUODENOSCOPY (EGD) WITH BIOPSIES;  Surgeon: Lucilla Lame, MD;  Location: [facility];  Service: Endoscopy;  Laterality: N/A;  diabetic -insulin and oral meds sleep apnea   TOTAL KNEE ARTHROPLASTY Right     Family History  Problem Relation Age of Onset   Stroke Sister    Hyperlipidemia Sister    Alcohol abuse Brother    Diabetes Brother    Stroke Brother    Hypertension Mother    Diabetes Mother    Heart disease Mother    Diabetes Father    Heart disease Father    Hypertension Father    Hypertension Sister    Cancer Maternal Grandmother        Unsure   Diabetes Maternal Grandfather    Colon cancer Neg Hx    Liver disease Neg Hx    Breast cancer Neg Hx     Social History   Tobacco Use   Smoking status: Former  Packs/day: 1.00    Years: 40.00    Total pack years: 40.00    Types: Cigarettes    Start date: 09/04/1972    Quit date: 12/30/2012    Years since quitting: 9.3   Smokeless tobacco: Never   Tobacco comments:    smoking cessation materials not required  Substance Use Topics   Alcohol use: No    Alcohol/week: 0.0 standard drinks of alcohol     Current Outpatient Medications:    ACCU-CHEK FASTCLIX LANCETS MISC, , Disp: , Rfl:    ACCU-CHEK SMARTVIEW test strip, Check fsbs two times daily  DMII, Disp: 100 each, Rfl: 6   Alcohol Swabs (B-D SINGLE USE SWABS REGULAR) PADS, 1 each by Does not apply route 4 (four) times daily., Disp: 200 each, Rfl: 2   allopurinol (ZYLOPRIM) 100 MG tablet, TAKE 1 TABLET EVERY DAY, Disp: 90 tablet, Rfl: 1   amLODipine (NORVASC) 2.5 MG tablet, TAKE 1 TABLET EVERY DAY, Disp: 90 tablet, Rfl: 1   aspirin EC 81 MG tablet, Take 81 mg by mouth every morning. , Disp: , Rfl:    BD INSULIN SYRINGE U/F 31G X 5/16" 0.3 ML MISC, , Disp: , Rfl:    blood  glucose meter kit and supplies KIT, Dispense based on patient and insurance preference. Use up to four times daily as directed. (FOR ICD-9 250.00, 250.01)., Disp: 1 each, Rfl: 0   Cholecalciferol (VITAMIN D-3 PO), Take 1,000 Units by mouth daily. , Disp: , Rfl:    CINNAMON PO, Take 1,000 mg by mouth daily., Disp: , Rfl:    cloNIDine (CATAPRES) 0.1 MG tablet, TAKE 1 TABLET (0.1 MG TOTAL) BY MOUTH EVERY EVENING., Disp: 90 tablet, Rfl: 1   Coenzyme Q10 (CO Q-10) 100 MG CAPS, TAKE 1 CAPSULE EVERY DAY, Disp: 90 capsule, Rfl: 1   DENTA 5000 PLUS 1.1 % CREA dental cream, , Disp: , Rfl:    estradiol (ESTRACE) 0.5 MG tablet, TAKE 1 TABLET EVERY DAY, Disp: 90 tablet, Rfl: 1   Ferrous Sulfate (IRON) 325 (65 Fe) MG TABS, Take 325 mg by mouth daily. Nature made brand-, Disp: , Rfl:    fluticasone (FLONASE) 50 MCG/ACT nasal spray, Place 2 sprays into both nostrils daily., Disp: 48 g, Rfl: 2   fluticasone-salmeterol (ADVAIR) 250-50 MCG/ACT AEPB, INHALE 1 PUFF INTO THE LUNGS IN THE MORNING AND AT BEDTIME., Disp: 180 each, Rfl: 1   Insulin Glargine (BASAGLAR KWIKPEN) 100 UNIT/ML SOPN, Inject 60 Units into the skin at bedtime., Disp: , Rfl:    Insulin Pen Needle (FIFTY50 PEN NEEDLES) 31G X 5 MM MISC, Use once daily, Disp: , Rfl:    ipratropium (ATROVENT) 0.03 % nasal spray, Place 2 sprays into both nostrils every 12 (twelve) hours., Disp: 90 mL, Rfl: 1   isosorbide mononitrate (IMDUR) 60 MG 24 hr tablet, TAKE 1 TABLET EVERY DAY, Disp: 90 tablet, Rfl: 1   lidocaine (HM LIDOCAINE PATCH) 4 %, Place 1 patch onto the skin daily., Disp: 30 patch, Rfl: 0   loratadine (CLARITIN) 10 MG tablet, TAKE 1 TABLET EVERY DAY, Disp: 90 tablet, Rfl: 1   losartan (COZAAR) 50 MG tablet, TAKE 1 TABLET EVERY DAY, Disp: 90 tablet, Rfl: 1   metoCLOPramide (REGLAN) 5 MG tablet, Take 1 tablet (5 mg total) by mouth 3 (three) times daily before meals. TAKE 1 TO 2 TABLETS FOUR TIMES DAILY BEFORE MEALS  AND AT BEDTIME, Disp: 270 tablet, Rfl: 0    metoprolol succinate (TOPROL-XL) 50 MG 24 hr tablet,  TAKE 1 TABLET EVERY DAY, Disp: 90 tablet, Rfl: 3   montelukast (SINGULAIR) 10 MG tablet, TAKE 1 TABLET (10 MG TOTAL) BY MOUTH EVERY EVENING., Disp: 90 tablet, Rfl: 1   pantoprazole (PROTONIX) 40 MG tablet, TAKE 1 TABLET (40 MG TOTAL) BY MOUTH EVERY MORNING., Disp: 90 tablet, Rfl: 1   rosuvastatin (CRESTOR) 20 MG tablet, TAKE 1 TABLET EVERY DAY, Disp: 90 tablet, Rfl: 1   SYNJARDY XR 12.01-999 MG TB24, Take 2 tablets by mouth daily., Disp: , Rfl:    tiZANidine (ZANAFLEX) 4 MG tablet, Take 1 tablet (4 mg total) by mouth every 6 (six) hours as needed for muscle spasms., Disp: 30 tablet, Rfl: 0   TRULICITY 9.32 IZ/1.2WP SOPN, , Disp: , Rfl:    venlafaxine XR (EFFEXOR-XR) 75 MG 24 hr capsule, TAKE 1 CAPSULE EVERY DAY, Disp: 90 capsule, Rfl: 1  Allergies  Allergen Reactions   Codeine Other (See Comments)    Unknown reaction   Contrast Media [Iodinated Contrast Media] Itching   Lisinopril Cough   Sulfa Antibiotics Itching    I personally reviewed active problem list, medication list, allergies, family history, social history, health maintenance with the patient/caregiver today.   ROS  ***  Objective  There were no vitals filed for this visit.  There is no height or weight on file to calculate BMI.  Physical Exam ***  No results found for this or any previous visit (from the past 2160 hour(s)).  Diabetic Foot Exam: Diabetic Foot Exam - Simple   No data filed    ***  PHQ2/9:    02/10/2022    1:01 PM 12/27/2021    9:35 AM 12/22/2021    8:51 AM 11/02/2021    8:15 AM 06/29/2021    8:32 AM  Depression screen PHQ 2/9  Decreased Interest 0 0 0 0 0  Down, Depressed, Hopeless 0 0 0 0 0  PHQ - 2 Score 0 0 0 0 0  Altered sleeping 0 0  0   Tired, decreased energy 0 0  0   Change in appetite 0 0  0   Feeling bad or failure about yourself  0 0  0   Trouble concentrating 0 0  0   Moving slowly or fidgety/restless 0 0  0   Suicidal  thoughts 0 0  0   PHQ-9 Score 0 0  0   Difficult doing work/chores Not difficult at all        phq 9 is {gen pos [id number]}   Fall Risk:    02/10/2022    1:01 PM 12/27/2021    9:35 AM 12/22/2021    8:52 AM 11/02/2021    8:15 AM 06/29/2021    8:32 AM  Fall Risk   Falls in the past year? 0 0 0 0 0  Number falls in past yr: 0  0  0  Injury with Fall? 0  0  0  Risk for fall due to :   No Fall Risks  No Fall Risks  Follow up  Falls prevention discussed Falls prevention discussed Falls prevention discussed Falls prevention discussed      Functional Status Survey:      Assessment & Plan  *** There are no diagnoses linked to this encounter.

## 2022-05-18 ENCOUNTER — Ambulatory Visit: Payer: Medicare PPO | Admitting: Family Medicine

## 2022-05-18 ENCOUNTER — Ambulatory Visit
Admission: RE | Admit: 2022-05-18 | Discharge: 2022-05-18 | Disposition: A | Discharge status: Home / Self Care | Payer: Medicare PPO | Source: Ambulatory Visit | Attending: Family Medicine | Admitting: Family Medicine

## 2022-05-18 DIAGNOSIS — R921 Mammographic calcification found on diagnostic imaging of breast: Secondary | ICD-10-CM

## 2022-05-18 DIAGNOSIS — N63 Unspecified lump in unspecified breast: Secondary | ICD-10-CM | POA: Insufficient documentation

## 2022-05-18 DIAGNOSIS — R928 Other abnormal and inconclusive findings on diagnostic imaging of breast: Secondary | ICD-10-CM

## 2022-05-19 ENCOUNTER — Other Ambulatory Visit: Payer: Self-pay | Admitting: Family Medicine

## 2022-05-19 DIAGNOSIS — R921 Mammographic calcification found on diagnostic imaging of breast: Secondary | ICD-10-CM

## 2022-05-19 DIAGNOSIS — N63 Unspecified lump in unspecified breast: Secondary | ICD-10-CM

## 2022-05-19 DIAGNOSIS — R928 Other abnormal and inconclusive findings on diagnostic imaging of breast: Secondary | ICD-10-CM

## 2022-05-19 NOTE — Progress Notes (Unsigned)
Name: Dana Sparks   MRN: [id number]    DOB: 1951-04-26   Date:05/22/2022       Progress Note  Subjective  Chief Complaint  Follow Up  HPI  Obesity:she is now BMI below 35, eating healthier, taking Trulicity, reminded her to  her to resume regular physical activity    Hyperlipidemia/Atherosclerosis of aorta: taking statin therapy, no muscle problems, reviewed last labs with patient , LDL 66 , continue medication, denies myalgias . We will recheck labs today   Controlled gout: we will recheck uric acid, no symptoms in a while  DMII with gastroparesis/CKI/neuropathy: she is taking medication as prescribed, seeing Dr. Gabriel Carina.  Her hgbA1C  was 7.8% to 8.2%, 8.2%  6.8 % 6.3 % 6.4 % 6.1 % and last visit with Dr. Gabriel Carina 12/16/2021 it was 6.2 % She is on Trulicity, Synjardi and Basaglar 60 units, she states since she started taking Synjardi at night, nausea and indigestion resolved. She is getting it through [employer] and has been compliant with mediation. She has gastroparesis controlled with reglan before meals, dyslipidemia on crestor, on ARB for microalbuminuria. She has intermittent  polyphagia, she denies  polyuria or polydipsia. Eye exam is up to date. She has vascular disease and intermittent claudication. She is taking aspirin daily. She states glucose fasting has been 80-120's and doing well    HTN: she was on Norvasc 2.5 mg , Losartan and Toprol xl, bp is  at goal today  . No chest pain at this time. She is compliant with medication but I don't see a refill of clonidine or Losartan since last year, advised to bring all medication to all doctors visits   Angina: CAD, her cardiologist is Dr. Clayborn Bigness. She takes statin therapy, aspirin , beta blocker and ARB  and Imdur , angina under control with medical management . She was on disability for heart disease for years but in social security now.  Last  Echo from 2016 showed decreased in EF 25-30% and global hypokines. , last echo was  greatly improved. She has orthopnea, uses two pillows . Declines lower extremity edema    FINDINGS: Myoview done 10/21/2020  EF 64 % Regional wall motion:  reveals normal myocardial thickening and wall  motion.  The overall quality of the study is good.    Artifacts noted: no  Left ventricular cavity: normal.   Perfusion Analysis:  SPECT images demonstrate homogeneous tracer  distribution throughout the myocardium. Defect type : Normal     OSA: she has been wearing CPAP only two night a week,  explained she has pulmonary hypertension needs to wear CPAP 7 nights for at least 4 hours . Unchanged    Major Depression in remission  she is doing well on Effexor, phq 9 is normal today, no side effects of medication . She states feeling well for a while but wants to continue medication since it also helps with hot flashes Unchanged    Hyperlipidemia: taking statin therapy, no muscle problems, reviewed last labs with patient , LDL below 60 now  , continue medication, denies myalgias , she has occasional cramps . We will recheck labs today    Chronic bronchitis/ Emphysema :  she used to smoke 1 pack daily for 50 years, quit smoking in 2014 she is now on Advair and no longer a productive cough in am, no SOB or wheezing She is due for repeat CT lung , referral placed    PVD with claudication/Atherosclerosis of aorta: on  aspirin and statin therapy, she has not been physically active lately to know how bad claudication is at this time. She is not ready to see vascular surgeon at this time  Hot  Flashes: she is on Effexor , Premarin, black cohosh,  and clonidine, a few years ago her symptoms were severe when she stopped Effexor. Doing well at this time   Abnormal mammogram: done 09/14, she will go for serotactic biopsy   Patient Active Problem List   Diagnosis Date Noted   Emphysema lung ([place]) 07/09/2020   Hypercalcemia 07/05/2020   Chronic bronchitis ([place]) 02/22/2020   Disease of stomach    Hx of  colonic polyps    Hepatomegaly 07/23/2018   Fatty liver 07/23/2018   Aortic atherosclerosis ([place]) 07/09/2018   Diabetes mellitus type 2, controlled, with complications ([place]) 98/72/1587   Nasal polyp 09/03/2017   Lipoma of back 09/03/2017   Claudication ([place]) 06/04/2017   LVH (left ventricular hypertrophy) 02/27/2017   Decreased cardiac ejection fraction 02/27/2017   Left hand weakness 10/12/2016   Elevated antinuclear antibody (ANA) level 04/27/2016   Angina pectoris ([place]) 11/01/2015   Well controlled type 2 diabetes mellitus with gastroparesis ([place]) 06/22/2015   Low TSH level 06/22/2015   Iron deficiency anemia 04/07/2015   Intestinal metaplasia of gastric mucosa 03/11/2015   CAD (coronary artery disease), native coronary artery 02/24/2015   History of coronary artery stent placement 02/24/2015   Chronic kidney disease, stage 3, mod decreased GFR ([place]) 02/24/2015   Menopause 27/61/8485   History of Helicobacter pylori infection 02/24/2015   Mild mitral insufficiency 02/24/2015   Mild tricuspid insufficiency 02/24/2015   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 ([place]) 02/24/2015   Controlled gout 02/24/2015   Depression, major, recurrent, mild ([place]) 02/24/2015   Mild pulmonary hypertension ([place]) 02/24/2015   Gastroesophageal reflux disease without esophagitis 02/24/2015   Perennial allergic rhinitis 02/24/2015   HLD (hyperlipidemia) 02/20/2015   Hypertension, benign 02/20/2015   Apnea, sleep 02/20/2015   Controlled diabetes mellitus with stage 3 chronic kidney disease, without long-term current use of insulin ([place]) 02/20/2015    Past Surgical History:  Procedure Laterality Date   ABDOMINAL HYSTERECTOMY     total   CATARACT EXTRACTION     CATARACT EXTRACTION W/PHACO Left 03/24/2019   Procedure: CATARACT EXTRACTION PHACO AND INTRAOCULAR LENS PLACEMENT ([place])  LEFT DIABETIC;  Surgeon: Eulogio Bear, MD;  Location: [facility];  Service:  Ophthalmology;  Laterality: Left;  Diabetic - insulin and oral meds sleep apnea   COLONOSCOPY  01/2014   sigmoid diverticulosis. desc colon TA, hyperplastic polyp   COLONOSCOPY WITH PROPOFOL N/A 09/02/2018   Procedure: COLONOSCOPY WITH PROPOFOL;  Surgeon: Lucilla Lame, MD;  Location: [facility];  Service: Endoscopy;  Laterality: N/A;   CORONARY ANGIOPLASTY WITH STENT PLACEMENT     ESOPHAGOGASTRODUODENOSCOPY N/A 02/16/2015   negative h pylori, focal gastic intestinal metaplasia   ESOPHAGOGASTRODUODENOSCOPY (EGD) WITH PROPOFOL N/A 09/02/2018   Procedure: ESOPHAGOGASTRODUODENOSCOPY (EGD) WITH BIOPSIES;  Surgeon: Lucilla Lame, MD;  Location: [facility];  Service: Endoscopy;  Laterality: N/A;  diabetic -insulin and oral meds sleep apnea   TOTAL KNEE ARTHROPLASTY Right     Family History  Problem Relation Age of Onset   Stroke Sister    Hyperlipidemia Sister    Alcohol abuse Brother    Diabetes Brother    Stroke Brother    Hypertension Mother    Diabetes Mother    Heart disease Mother  Diabetes Father    Heart disease Father    Hypertension Father    Hypertension Sister    Cancer Maternal Grandmother        Unsure   Diabetes Maternal Grandfather    Colon cancer Neg Hx    Liver disease Neg Hx    Breast cancer Neg Hx     Social History   Tobacco Use   Smoking status: Former    Packs/day: 1.00    Years: 40.00    Total pack years: 40.00    Types: Cigarettes    Start date: 09/04/1972    Quit date: 12/30/2012    Years since quitting: 9.3   Smokeless tobacco: Never   Tobacco comments:    smoking cessation materials not required  Substance Use Topics   Alcohol use: No    Alcohol/week: 0.0 standard drinks of alcohol     Current Outpatient Medications:    ACCU-CHEK FASTCLIX LANCETS MISC, , Disp: , Rfl:    ACCU-CHEK SMARTVIEW test strip, Check fsbs two times daily  DMII, Disp: 100 each, Rfl: 6   Alcohol Swabs (B-D SINGLE USE SWABS REGULAR) PADS, 1 each by  Does not apply route 4 (four) times daily., Disp: 200 each, Rfl: 2   allopurinol (ZYLOPRIM) 100 MG tablet, TAKE 1 TABLET EVERY DAY, Disp: 90 tablet, Rfl: 1   amLODipine (NORVASC) 2.5 MG tablet, TAKE 1 TABLET EVERY DAY, Disp: 90 tablet, Rfl: 1   aspirin EC 81 MG tablet, Take 81 mg by mouth every morning. , Disp: , Rfl:    BD INSULIN SYRINGE U/F 31G X 5/16" 0.3 ML MISC, , Disp: , Rfl:    blood glucose meter kit and supplies KIT, Dispense based on patient and insurance preference. Use up to four times daily as directed. (FOR ICD-9 250.00, 250.01)., Disp: 1 each, Rfl: 0   Cholecalciferol (VITAMIN D-3 PO), Take 1,000 Units by mouth daily. , Disp: , Rfl:    CINNAMON PO, Take 1,000 mg by mouth daily., Disp: , Rfl:    cloNIDine (CATAPRES) 0.1 MG tablet, TAKE 1 TABLET (0.1 MG TOTAL) BY MOUTH EVERY EVENING., Disp: 90 tablet, Rfl: 1   Coenzyme Q10 (CO Q-10) 100 MG CAPS, TAKE 1 CAPSULE EVERY DAY, Disp: 90 capsule, Rfl: 1   DENTA 5000 PLUS 1.1 % CREA dental cream, , Disp: , Rfl:    estradiol (ESTRACE) 0.5 MG tablet, TAKE 1 TABLET EVERY DAY, Disp: 90 tablet, Rfl: 1   Ferrous Sulfate (IRON) 325 (65 Fe) MG TABS, Take 325 mg by mouth daily. Nature made brand-, Disp: , Rfl:    fluticasone (FLONASE) 50 MCG/ACT nasal spray, Place 2 sprays into both nostrils daily., Disp: 48 g, Rfl: 2   fluticasone-salmeterol (ADVAIR) 250-50 MCG/ACT AEPB, INHALE 1 PUFF INTO THE LUNGS IN THE MORNING AND AT BEDTIME., Disp: 180 each, Rfl: 1   Insulin Glargine (BASAGLAR KWIKPEN) 100 UNIT/ML SOPN, Inject 60 Units into the skin at bedtime., Disp: , Rfl:    Insulin Pen Needle (FIFTY50 PEN NEEDLES) 31G X 5 MM MISC, Use once daily, Disp: , Rfl:    ipratropium (ATROVENT) 0.03 % nasal spray, Place 2 sprays into both nostrils every 12 (twelve) hours., Disp: 90 mL, Rfl: 1   isosorbide mononitrate (IMDUR) 60 MG 24 hr tablet, TAKE 1 TABLET EVERY DAY, Disp: 90 tablet, Rfl: 1   loratadine (CLARITIN) 10 MG tablet, TAKE 1 TABLET EVERY DAY, Disp: 90  tablet, Rfl: 1   losartan (COZAAR) 50 MG tablet, TAKE 1 TABLET EVERY  DAY, Disp: 90 tablet, Rfl: 1   metoCLOPramide (REGLAN) 5 MG tablet, Take 1 tablet (5 mg total) by mouth 3 (three) times daily before meals. TAKE 1 TO 2 TABLETS FOUR TIMES DAILY BEFORE MEALS  AND AT BEDTIME, Disp: 270 tablet, Rfl: 0   metoprolol succinate (TOPROL-XL) 50 MG 24 hr tablet, TAKE 1 TABLET EVERY DAY, Disp: 90 tablet, Rfl: 3   montelukast (SINGULAIR) 10 MG tablet, TAKE 1 TABLET (10 MG TOTAL) BY MOUTH EVERY EVENING., Disp: 90 tablet, Rfl: 1   pantoprazole (PROTONIX) 40 MG tablet, TAKE 1 TABLET (40 MG TOTAL) BY MOUTH EVERY MORNING., Disp: 90 tablet, Rfl: 1   rosuvastatin (CRESTOR) 20 MG tablet, TAKE 1 TABLET EVERY DAY, Disp: 90 tablet, Rfl: 1   SYNJARDY XR 12.01-999 MG TB24, Take 2 tablets by mouth daily., Disp: , Rfl:    tiZANidine (ZANAFLEX) 4 MG tablet, Take 1 tablet (4 mg total) by mouth every 6 (six) hours as needed for muscle spasms., Disp: 30 tablet, Rfl: 0   TRULICITY 3.78 HY/8.5OY SOPN, , Disp: , Rfl:    venlafaxine XR (EFFEXOR-XR) 75 MG 24 hr capsule, TAKE 1 CAPSULE EVERY DAY, Disp: 90 capsule, Rfl: 1   lidocaine (HM LIDOCAINE PATCH) 4 %, Place 1 patch onto the skin daily. (Patient not taking: Reported on 05/22/2022), Disp: 30 patch, Rfl: 0  Allergies  Allergen Reactions   Codeine Other (See Comments)    Unknown reaction   Contrast Media [Iodinated Contrast Media] Itching   Lisinopril Cough   Sulfa Antibiotics Itching    I personally reviewed active problem list, medication list, allergies, family history, social history, health maintenance with the patient/caregiver today.   ROS  Constitutional: Negative for fever or weight change.  Respiratory: Negative for cough and shortness of breath.   Cardiovascular: Negative for chest pain or palpitations.  Gastrointestinal: Negative for abdominal pain, no bowel changes.  Musculoskeletal: Negative for gait problem or joint swelling.  Skin: Negative for rash.   Neurological: Negative for dizziness or headache.  No other specific complaints in a complete review of systems (except as listed in HPI above).   Objective  Vitals:   05/22/22 1048  BP: 124/70  Pulse: 87  Resp: 16  SpO2: 94%  Weight: 213 lb (96.6 kg)  Height: '5\' 5"'  (1.651 m)    Body mass index is 35.45 kg/m.  Physical Exam  Constitutional: Patient appears well-developed and well-nourished. Obese  No distress.  HEENT: head atraumatic, normocephalic, pupils equal and reactive to light, neck supple, throat within normal limits Cardiovascular: Normal rate, regular rhythm and normal heart sounds.  No murmur heard. No BLE edema. Pulmonary/Chest: Effort normal and breath sounds normal. No respiratory distress. Abdominal: Soft.  There is no tenderness. Psychiatric: Patient has a normal mood and affect. behavior is normal. Judgment and thought content normal.   PHQ2/9:    05/22/2022   10:49 AM 02/10/2022    1:01 PM 12/27/2021    9:35 AM 12/22/2021    8:51 AM 11/02/2021    8:15 AM  Depression screen PHQ 2/9  Decreased Interest 0 0 0 0 0  Down, Depressed, Hopeless 0 0 0 0 0  PHQ - 2 Score 0 0 0 0 0  Altered sleeping 0 0 0  0  Tired, decreased energy 0 0 0  0  Change in appetite 0 0 0  0  Feeling bad or failure about yourself  0 0 0  0  Trouble concentrating 0 0 0  0  Moving  slowly or fidgety/restless 0 0 0  0  Suicidal thoughts 0 0 0  0  PHQ-9 Score 0 0 0  0  Difficult doing work/chores  Not difficult at all       phq 9 is negative   Fall Risk:    05/22/2022   10:49 AM 02/10/2022    1:01 PM 12/27/2021    9:35 AM 12/22/2021    8:52 AM 11/02/2021    8:15 AM  Fall Risk   Falls in the past year? 0 0 0 0 0  Number falls in past yr: 0 0  0   Injury with Fall? 0 0  0   Risk for fall due to : No Fall Risks   No Fall Risks   Follow up Falls prevention discussed  Falls prevention discussed Falls prevention discussed Falls prevention discussed      Functional Status Survey: Is  the patient deaf or have difficulty hearing?: No Does the patient have difficulty seeing, even when wearing glasses/contacts?: No Does the patient have difficulty concentrating, remembering, or making decisions?: No Does the patient have difficulty walking or climbing stairs?: No Does the patient have difficulty dressing or bathing?: No Does the patient have difficulty doing errands alone such as visiting a doctor's office or shopping?: No    Assessment & Plan  1. Controlled type 2 diabetes mellitus with complication, with long-term current use of insulin (HCC)  - COMPLETE METABOLIC PANEL WITH GFR  2. Angina pectoris ([place])  - Lipid panel  3. Mild pulmonary hypertension (HCC)  Needs to resume CPAP daily   4. Need for immunization against influenza  - Flu Vaccine QUAD High Dose(Fluad)  5. Claudication ([place])  - Lipid panel  6. Major depression in remission ([place])   7. Atherosclerosis of abdominal aorta (HCC)  - Lipid panel  8. Simple chronic bronchitis ([place])  On Advair  9. Dyslipidemia associated with type 2 diabetes mellitus (HCC)  - COMPLETE METABOLIC PANEL WITH GFR  10. Obstructive sleep apnea syndrome  Needs to resume CPAP every night   11. Gastroesophageal reflux disease without esophagitis   12. Controlled gout  - Uric acid  13. Screening for lung cancer  - Ambulatory Referral Lung Cancer Screening [facility]  14. Centrilobular emphysema ([place])  - Ambulatory Referral Lung Cancer Screening [facility]  15. Hypertension, benign  - amLODipine (NORVASC) 2.5 MG tablet; Take 1 tablet (2.5 mg total) by mouth daily.  Dispense: 90 tablet; Refill: 1 - losartan (COZAAR) 50 MG tablet; Take 1 tablet (50 mg total) by mouth daily.  Dispense: 90 tablet; Refill: 1 - metoprolol succinate (TOPROL-XL) 50 MG 24 hr tablet; Take 1 tablet (50 mg total) by mouth daily. Take with or immediately following a meal.  Dispense: 90 tablet; Refill: 1  16. Seasonal  and perennial allergic rhinitis  - montelukast (SINGULAIR) 10 MG tablet; Take 1 tablet (10 mg total) by mouth every evening.  Dispense: 90 tablet; Refill: 1

## 2022-05-22 ENCOUNTER — Encounter: Payer: Self-pay | Admitting: Family Medicine

## 2022-05-22 ENCOUNTER — Ambulatory Visit (INDEPENDENT_AMBULATORY_CARE_PROVIDER_SITE_OTHER): Payer: Medicare PPO | Admitting: Family Medicine

## 2022-05-22 VITALS — BP 124/70 | HR 87 | Resp 16 | Ht 65.0 in | Wt 213.0 lb

## 2022-05-22 DIAGNOSIS — I700 Atherosclerosis of aorta: Secondary | ICD-10-CM | POA: Diagnosis not present

## 2022-05-22 DIAGNOSIS — E785 Hyperlipidemia, unspecified: Secondary | ICD-10-CM

## 2022-05-22 DIAGNOSIS — E118 Type 2 diabetes mellitus with unspecified complications: Secondary | ICD-10-CM

## 2022-05-22 DIAGNOSIS — I10 Essential (primary) hypertension: Secondary | ICD-10-CM

## 2022-05-22 DIAGNOSIS — J410 Simple chronic bronchitis: Secondary | ICD-10-CM

## 2022-05-22 DIAGNOSIS — J3089 Other allergic rhinitis: Secondary | ICD-10-CM

## 2022-05-22 DIAGNOSIS — F325 Major depressive disorder, single episode, in full remission: Secondary | ICD-10-CM

## 2022-05-22 DIAGNOSIS — E1169 Type 2 diabetes mellitus with other specified complication: Secondary | ICD-10-CM | POA: Diagnosis not present

## 2022-05-22 DIAGNOSIS — Z23 Encounter for immunization: Secondary | ICD-10-CM | POA: Diagnosis not present

## 2022-05-22 DIAGNOSIS — J302 Other seasonal allergic rhinitis: Secondary | ICD-10-CM

## 2022-05-22 DIAGNOSIS — Z122 Encounter for screening for malignant neoplasm of respiratory organs: Secondary | ICD-10-CM

## 2022-05-22 DIAGNOSIS — Z794 Long term (current) use of insulin: Secondary | ICD-10-CM

## 2022-05-22 DIAGNOSIS — K219 Gastro-esophageal reflux disease without esophagitis: Secondary | ICD-10-CM

## 2022-05-22 DIAGNOSIS — I272 Pulmonary hypertension, unspecified: Secondary | ICD-10-CM | POA: Diagnosis not present

## 2022-05-22 DIAGNOSIS — M109 Gout, unspecified: Secondary | ICD-10-CM | POA: Diagnosis not present

## 2022-05-22 DIAGNOSIS — G4733 Obstructive sleep apnea (adult) (pediatric): Secondary | ICD-10-CM

## 2022-05-22 DIAGNOSIS — I739 Peripheral vascular disease, unspecified: Secondary | ICD-10-CM | POA: Diagnosis not present

## 2022-05-22 DIAGNOSIS — I209 Angina pectoris, unspecified: Secondary | ICD-10-CM

## 2022-05-22 DIAGNOSIS — J432 Centrilobular emphysema: Secondary | ICD-10-CM

## 2022-05-22 MED ORDER — LOSARTAN POTASSIUM 50 MG PO TABS: 50.0000 mg | ORAL_TABLET | Freq: Every day | ORAL | 1 refills | Status: DC

## 2022-05-22 MED ORDER — MONTELUKAST SODIUM 10 MG PO TABS: 10.0000 mg | ORAL_TABLET | Freq: Every evening | ORAL | 1 refills | Status: DC

## 2022-05-22 MED ORDER — METOPROLOL SUCCINATE ER 50 MG PO TB24: 50.0000 mg | ORAL_TABLET | Freq: Every day | ORAL | 1 refills | Status: DC

## 2022-05-22 MED ORDER — AMLODIPINE BESYLATE 2.5 MG PO TABS: 2.5000 mg | ORAL_TABLET | Freq: Every day | ORAL | 1 refills | Status: DC

## 2022-05-22 NOTE — Patient Instructions (Addendum)
Check cost of shingrix and RSV at local pharmacy COVID-19 booster is also available now   Please verify if you are taking Clonidine and losartan

## 2022-05-23 LAB — COMPLETE METABOLIC PANEL WITH GFR
AG Ratio: 1.5 (calc) (ref 1.0–2.5)
ALT: 10 U/L (ref 6–29)
AST: 13 U/L (ref 10–35)
Albumin: 4.2 g/dL (ref 3.6–5.1)
Alkaline phosphatase (APISO): 62 U/L (ref 37–153)
BUN: 12 mg/dL (ref 7–25)
CO2: 25 mmol/L (ref 20–32)
Calcium: 9.7 mg/dL (ref 8.6–10.4)
Chloride: 107 mmol/L (ref 98–110)
Creat: 0.78 mg/dL (ref 0.60–1.00)
Globulin: 2.8 g/dL (calc) (ref 1.9–3.7)
Glucose, Bld: 58 mg/dL — ABNORMAL LOW (ref 65–99)
Potassium: 4.5 mmol/L (ref 3.5–5.3)
Sodium: 141 mmol/L (ref 135–146)
Total Bilirubin: 0.3 mg/dL (ref 0.2–1.2)
Total Protein: 7 g/dL (ref 6.1–8.1)
eGFR: 82 mL/min/{1.73_m2} (ref >=60)

## 2022-05-23 LAB — LIPID PANEL
Cholesterol: 117 mg/dL (ref <200)
HDL: 51 mg/dL (ref >=50)
LDL Cholesterol (Calc): 50 mg/dL (calc)
Non-HDL Cholesterol (Calc): 66 mg/dL (calc) (ref <130)
Total CHOL/HDL Ratio: 2.3 (calc) (ref <5.0)
Triglycerides: 75 mg/dL (ref <150)

## 2022-05-23 LAB — URIC ACID: Uric Acid, Serum: 3.1 mg/dL (ref 2.5–7.0)

## 2022-06-06 ENCOUNTER — Ambulatory Visit: Payer: Medicare PPO | Admitting: Family Medicine

## 2022-06-07 ENCOUNTER — Ambulatory Visit
Admission: RE | Admit: 2022-06-07 | Discharge: 2022-06-07 | Disposition: A | Discharge status: Home / Self Care | Payer: Medicare PPO | Source: Ambulatory Visit | Attending: Family Medicine | Admitting: Family Medicine

## 2022-06-07 DIAGNOSIS — R928 Other abnormal and inconclusive findings on diagnostic imaging of breast: Secondary | ICD-10-CM

## 2022-06-07 DIAGNOSIS — N6322 Unspecified lump in the left breast, upper inner quadrant: Secondary | ICD-10-CM | POA: Diagnosis not present

## 2022-06-07 DIAGNOSIS — N63 Unspecified lump in unspecified breast: Secondary | ICD-10-CM | POA: Insufficient documentation

## 2022-06-07 DIAGNOSIS — R921 Mammographic calcification found on diagnostic imaging of breast: Secondary | ICD-10-CM

## 2022-06-07 HISTORY — PX: BREAST BIOPSY: SHX20

## 2022-06-08 LAB — SURGICAL PATHOLOGY

## 2022-06-08 NOTE — Progress Notes (Deleted)
Name: Dana Sparks   MRN: [id number]    DOB: 10/13/1950   Date:06/08/2022       Progress Note  Subjective  Chief Complaint  Medication Reconciliation   HPI  *** Patient Active Problem List   Diagnosis Date Noted   Major depression in remission ([place]) 05/22/2022   Controlled type 2 diabetes mellitus with complication, with long-term current use of insulin ([place]) 05/22/2022   Dyslipidemia associated with type 2 diabetes mellitus ([place]) 05/22/2022   Centrilobular emphysema ([place]) 07/09/2020   Hypercalcemia 07/05/2020   Simple chronic bronchitis ([place]) 02/22/2020   Hx of colonic polyps    Hepatomegaly 07/23/2018   Fatty liver 07/23/2018   Atherosclerosis of abdominal aorta ([place]) 07/09/2018   Diabetes mellitus type 2, controlled, with complications ([place]) 64/68/0321   Nasal polyp 09/03/2017   Lipoma of back 09/03/2017   Claudication ([place]) 06/04/2017   LVH (left ventricular hypertrophy) 02/27/2017   Decreased cardiac ejection fraction 02/27/2017   Left hand weakness 10/12/2016   Elevated antinuclear antibody (ANA) level 04/27/2016   Angina pectoris ([place]) 11/01/2015   Well controlled type 2 diabetes mellitus with gastroparesis ([place]) 06/22/2015   Low TSH level 06/22/2015   Iron deficiency anemia 04/07/2015   Intestinal metaplasia of gastric mucosa 03/11/2015   CAD (coronary artery disease), native coronary artery 02/24/2015   History of coronary artery stent placement 02/24/2015   Chronic kidney disease, stage 3, mod decreased GFR ([place]) 02/24/2015   Menopause 22/48/2500   History of Helicobacter pylori infection 02/24/2015   Mild mitral insufficiency 02/24/2015   Mild tricuspid insufficiency 02/24/2015   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 ([place]) 02/24/2015   Controlled gout 02/24/2015   Depression, major, recurrent, mild ([place]) 02/24/2015   Mild pulmonary hypertension ([place]) 02/24/2015   Gastroesophageal reflux disease without esophagitis 02/24/2015    Perennial allergic rhinitis 02/24/2015   HLD (hyperlipidemia) 02/20/2015   Hypertension, benign 02/20/2015   Obstructive sleep apnea syndrome 02/20/2015   Controlled diabetes mellitus with stage 3 chronic kidney disease, without long-term current use of insulin ([place]) 02/20/2015    Past Surgical History:  Procedure Laterality Date   ABDOMINAL HYSTERECTOMY     total   BREAST BIOPSY Right 06/07/2022   stereo bx, calcs, COIL clip-path pending   CATARACT EXTRACTION     CATARACT EXTRACTION W/PHACO Left 03/24/2019   Procedure: CATARACT EXTRACTION PHACO AND INTRAOCULAR LENS PLACEMENT ([place])  LEFT DIABETIC;  Surgeon: Eulogio Bear, MD;  Location: [facility];  Service: Ophthalmology;  Laterality: Left;  Diabetic - insulin and oral meds sleep apnea   COLONOSCOPY  01/02/2014   sigmoid diverticulosis. desc colon TA, hyperplastic polyp   COLONOSCOPY WITH PROPOFOL N/A 09/02/2018   Procedure: COLONOSCOPY WITH PROPOFOL;  Surgeon: Lucilla Lame, MD;  Location: [facility];  Service: Endoscopy;  Laterality: N/A;   CORONARY ANGIOPLASTY WITH STENT PLACEMENT     ESOPHAGOGASTRODUODENOSCOPY N/A 02/16/2015   negative h pylori, focal gastic intestinal metaplasia   ESOPHAGOGASTRODUODENOSCOPY (EGD) WITH PROPOFOL N/A 09/02/2018   Procedure: ESOPHAGOGASTRODUODENOSCOPY (EGD) WITH BIOPSIES;  Surgeon: Lucilla Lame, MD;  Location: [facility];  Service: Endoscopy;  Laterality: N/A;  diabetic -insulin and oral meds sleep apnea   TOTAL KNEE ARTHROPLASTY Right     Family History  Problem Relation Age of Onset   Stroke Sister    Hyperlipidemia Sister    Alcohol abuse Brother    Diabetes Brother    Stroke Brother    Hypertension Mother    Diabetes Mother  Heart disease Mother    Diabetes Father    Heart disease Father    Hypertension Father    Hypertension Sister    Cancer Maternal Grandmother        Unsure   Diabetes Maternal Grandfather    Colon cancer Neg Hx    Liver disease  Neg Hx    Breast cancer Neg Hx     Social History   Tobacco Use   Smoking status: Former    Packs/day: 1.00    Years: 40.00    Total pack years: 40.00    Types: Cigarettes    Start date: 09/04/1972    Quit date: 12/30/2012    Years since quitting: 9.4   Smokeless tobacco: Never   Tobacco comments:    smoking cessation materials not required  Substance Use Topics   Alcohol use: No    Alcohol/week: 0.0 standard drinks of alcohol     Current Outpatient Medications:    ACCU-CHEK FASTCLIX LANCETS MISC, , Disp: , Rfl:    ACCU-CHEK SMARTVIEW test strip, Check fsbs two times daily  DMII, Disp: 100 each, Rfl: 6   Alcohol Swabs (B-D SINGLE USE SWABS REGULAR) PADS, 1 each by Does not apply route 4 (four) times daily., Disp: 200 each, Rfl: 2   allopurinol (ZYLOPRIM) 100 MG tablet, TAKE 1 TABLET EVERY DAY, Disp: 90 tablet, Rfl: 1   amLODipine (NORVASC) 2.5 MG tablet, Take 1 tablet (2.5 mg total) by mouth daily., Disp: 90 tablet, Rfl: 1   aspirin EC 81 MG tablet, Take 81 mg by mouth every morning. , Disp: , Rfl:    BD INSULIN SYRINGE U/F 31G X 5/16" 0.3 ML MISC, , Disp: , Rfl:    blood glucose meter kit and supplies KIT, Dispense based on patient and insurance preference. Use up to four times daily as directed. (FOR ICD-9 250.00, 250.01)., Disp: 1 each, Rfl: 0   Cholecalciferol (VITAMIN D-3 PO), Take 1,000 Units by mouth daily. , Disp: , Rfl:    CINNAMON PO, Take 1,000 mg by mouth daily., Disp: , Rfl:    cloNIDine (CATAPRES) 0.1 MG tablet, TAKE 1 TABLET (0.1 MG TOTAL) BY MOUTH EVERY EVENING., Disp: 90 tablet, Rfl: 1   Coenzyme Q10 (CO Q-10) 100 MG CAPS, TAKE 1 CAPSULE EVERY DAY, Disp: 90 capsule, Rfl: 1   DENTA 5000 PLUS 1.1 % CREA dental cream, , Disp: , Rfl:    estradiol (ESTRACE) 0.5 MG tablet, TAKE 1 TABLET EVERY DAY, Disp: 90 tablet, Rfl: 1   Ferrous Sulfate (IRON) 325 (65 Fe) MG TABS, Take 325 mg by mouth daily. Nature made brand-, Disp: , Rfl:    fluticasone (FLONASE) 50 MCG/ACT nasal  spray, Place 2 sprays into both nostrils daily., Disp: 48 g, Rfl: 2   fluticasone-salmeterol (ADVAIR) 250-50 MCG/ACT AEPB, INHALE 1 PUFF INTO THE LUNGS IN THE MORNING AND AT BEDTIME., Disp: 180 each, Rfl: 1   Insulin Glargine (BASAGLAR KWIKPEN) 100 UNIT/ML SOPN, Inject 60 Units into the skin at bedtime., Disp: , Rfl:    Insulin Pen Needle (FIFTY50 PEN NEEDLES) 31G X 5 MM MISC, Use once daily, Disp: , Rfl:    ipratropium (ATROVENT) 0.03 % nasal spray, Place 2 sprays into both nostrils every 12 (twelve) hours., Disp: 90 mL, Rfl: 1   isosorbide mononitrate (IMDUR) 60 MG 24 hr tablet, TAKE 1 TABLET EVERY DAY, Disp: 90 tablet, Rfl: 1   loratadine (CLARITIN) 10 MG tablet, TAKE 1 TABLET EVERY DAY, Disp: 90 tablet, Rfl: 1  losartan (COZAAR) 50 MG tablet, Take 1 tablet (50 mg total) by mouth daily., Disp: 90 tablet, Rfl: 1   metoCLOPramide (REGLAN) 5 MG tablet, Take 1 tablet (5 mg total) by mouth 3 (three) times daily before meals. TAKE 1 TO 2 TABLETS FOUR TIMES DAILY BEFORE MEALS  AND AT BEDTIME, Disp: 270 tablet, Rfl: 0   metoprolol succinate (TOPROL-XL) 50 MG 24 hr tablet, Take 1 tablet (50 mg total) by mouth daily. Take with or immediately following a meal., Disp: 90 tablet, Rfl: 1   montelukast (SINGULAIR) 10 MG tablet, Take 1 tablet (10 mg total) by mouth every evening., Disp: 90 tablet, Rfl: 1   pantoprazole (PROTONIX) 40 MG tablet, TAKE 1 TABLET (40 MG TOTAL) BY MOUTH EVERY MORNING., Disp: 90 tablet, Rfl: 1   rosuvastatin (CRESTOR) 20 MG tablet, TAKE 1 TABLET EVERY DAY, Disp: 90 tablet, Rfl: 1   SYNJARDY XR 12.01-999 MG TB24, Take 2 tablets by mouth daily., Disp: , Rfl:    tiZANidine (ZANAFLEX) 4 MG tablet, Take 1 tablet (4 mg total) by mouth every 6 (six) hours as needed for muscle spasms., Disp: 30 tablet, Rfl: 0   TRULICITY 8.67 EH/2.0NO SOPN, , Disp: , Rfl:    venlafaxine XR (EFFEXOR-XR) 75 MG 24 hr capsule, TAKE 1 CAPSULE EVERY DAY, Disp: 90 capsule, Rfl: 1  Allergies  Allergen Reactions    Codeine Other (See Comments)    Unknown reaction   Contrast Media [Iodinated Contrast Media] Itching   Lisinopril Cough   Sulfa Antibiotics Itching    I personally reviewed active problem list, medication list, allergies, family history, social history, health maintenance with the patient/caregiver today.   ROS  ***  Objective  There were no vitals filed for this visit.  There is no height or weight on file to calculate BMI.  Physical Exam ***  Recent Results (from the past 2160 hour(s))  Lipid panel     Status: None   Collection Time: 05/22/22 11:51 AM  Result Value Ref Range   Cholesterol 117 <200 mg/dL   HDL 51 > OR = 50 mg/dL   Triglycerides 75 <150 mg/dL   LDL Cholesterol (Calc) 50 mg/dL (calc)    Comment: Reference range: <100 . Desirable range <100 mg/dL for primary prevention;   <70 mg/dL for patients with CHD or diabetic patients  with > or = 2 CHD risk factors. Marland Kitchen LDL-C is now calculated using the Martin-Hopkins  calculation, which is a validated novel method providing  better accuracy than the Friedewald equation in the  estimation of LDL-C.  Cresenciano Genre et al. Annamaria Helling. 7096;283(66): 2061-2068  (http://education.[employer].com/faq/FAQ164)    Total CHOL/HDL Ratio 2.3 <5.0 (calc)   Non-HDL Cholesterol (Calc) 66 <130 mg/dL (calc)    Comment: For patients with diabetes plus 1 major ASCVD risk  factor, treating to a non-HDL-C goal of <100 mg/dL  (LDL-C of <70 mg/dL) is considered a therapeutic  option.   COMPLETE METABOLIC PANEL WITH GFR     Status: Abnormal   Collection Time: 05/22/22 11:51 AM  Result Value Ref Range   Glucose, Bld 58 (L) 65 - 99 mg/dL    Comment: .            Fasting reference interval .    BUN 12 7 - 25 mg/dL   Creat 0.78 0.60 - 1.00 mg/dL   eGFR 82 > OR = 60 mL/min/1.12m   BUN/Creatinine Ratio SEE NOTE: 6 - 22 (calc)    Comment:    Not  Reported: BUN and Creatinine are within    reference range. .    Sodium 141 135 - 146  mmol/L   Potassium 4.5 3.5 - 5.3 mmol/L   Chloride 107 98 - 110 mmol/L   CO2 25 20 - 32 mmol/L   Calcium 9.7 8.6 - 10.4 mg/dL   Total Protein 7.0 6.1 - 8.1 g/dL   Albumin 4.2 3.6 - 5.1 g/dL   Globulin 2.8 1.9 - 3.7 g/dL (calc)   AG Ratio 1.5 1.0 - 2.5 (calc)   Total Bilirubin 0.3 0.2 - 1.2 mg/dL   Alkaline phosphatase (APISO) 62 37 - 153 U/L   AST 13 10 - 35 U/L   ALT 10 6 - 29 U/L  Uric acid     Status: None   Collection Time: 05/22/22 11:51 AM  Result Value Ref Range   Uric Acid, Serum 3.1 2.5 - 7.0 mg/dL    Comment: Therapeutic target for gout patients: <6.0 mg/dL .   Surgical pathology     Status: None   Collection Time: 06/07/22 10:26 AM  Result Value Ref Range   SURGICAL PATHOLOGY      SURGICAL PATHOLOGY CASE: [phone] PATIENT: Ellsworth Lennox Surgical Pathology Report     Specimen Submitted: A. Breast, right  Clinical History: Indeterminate right breast CALCS.  HX of multiple oval benign appearing masses FA/cyst.  Fibroadenomatoid CALCS, FCC, fat necrosis, malignancy. Coil clip      DIAGNOSIS: A.  BREAST CALCIFICATIONS, RIGHT LOWER OUTER MIDDLE DEPTH; STEREOTACTIC BIOPSY: - FIBROADENOMATOID CHANGE WITH ASSOCIATED COARSE CALCIFICATIONS. - NEGATIVE FOR ATYPIA AND MALIGNANCY.  GROSS DESCRIPTION: A. Labeled: Right breast stereo biopsy lower outer middle depth calcs Received: in a formalin-filled [facility] collection device Specimen radiograph image(s) available for review Time/Date in fixative: Collected at 10:26 AM on 06/07/2022 and placed in formalin at 10:28 AM on 06/07/2022 Cold ischemic time: Less than 5 minutes Total fixation time: Approximately 10.75 hours Core pieces: Multiple Measurement: Aggregate, 6 x 1.2 x 0.3 cm Description / c omments: Received are cores and fragments of yellow fibrofatty tissue.  The accompanying diagram in sections A, C, and E checked. Inked: Blue Entirely submitted in cassette(s):  1 - section A 2 - section C 3 -  section E 4 - 5 - remaining tissue fragments      4 - sections B and D      5 - section F and remaining free-floating fragments  RB 06/07/2022  Final Diagnosis performed by Quay Burow, MD.   Electronically signed 06/08/2022 10:01:40AM The electronic signature indicates that the named Attending Pathologist has evaluated the specimen Technical component performed at [facility], [address] [place], [place] [address] Lab: [phone] Dir: Rush Farmer, MD, MMM  Professional component performed at [facility], [facility], [place], [place], [place] [address] Lab: [phone] Dir: Kathi Simpers, MD     PHQ2/9:    05/22/2022   10:49 AM 02/10/2022    1:01 PM 12/27/2021    9:35 AM 12/22/2021    8:51 AM 11/02/2021    8:15 AM  Depression screen PHQ 2/9  Decreased Interest 0 0 0 0 0  Down, Depressed, Hopeless 0 0 0 0 0  PHQ - 2 Score 0 0 0 0 0  Altered sleeping 0 0 0  0  Tired, decreased energy 0 0 0  0  Change in appetite 0 0 0  0  Feeling bad or failure about yourself  0 0 0  0  Trouble concentrating 0  0 0  0  Moving slowly or fidgety/restless 0 0 0  0  Suicidal thoughts 0 0 0  0  PHQ-9 Score 0 0 0  0  Difficult doing work/chores  Not difficult at all       phq 9 is {gen pos [id number]}   Fall Risk:    05/22/2022   10:49 AM 02/10/2022    1:01 PM 12/27/2021    9:35 AM 12/22/2021    8:52 AM 11/02/2021    8:15 AM  Fall Risk   Falls in the past year? 0 0 0 0 0  Number falls in past yr: 0 0  0   Injury with Fall? 0 0  0   Risk for fall due to : No Fall Risks   No Fall Risks   Follow up Falls prevention discussed  Falls prevention discussed Falls prevention discussed Falls prevention discussed      Functional Status Survey:      Assessment & Plan  *** There are no diagnoses linked to this encounter.

## 2022-06-08 NOTE — Progress Notes (Signed)
Name: Dana Sparks   MRN: [id number]    DOB: May 20, 1951   Date:06/09/2022       Progress Note  Subjective  Chief Complaint  Medication Reconciliation   HPI  Patient came in today for medication reconciliation. She brought most of her medications, She left insulin, trulicity and also Advair at home but she confirmed she has been taking is at prescribed  We will decrease metoclopramide that she takes for DM gastroparesis to BID since that is how she has bene taking it, previously instructions was to take it TID. We will continue CoQ10 for statin induced myopathy since it is working well for her. GERD is controlled with PPI and she needs a refill    Patient Active Problem List   Diagnosis Date Noted   Major depression in remission ([place]) 05/22/2022   Controlled type 2 diabetes mellitus with complication, with long-term current use of insulin ([place]) 05/22/2022   Dyslipidemia associated with type 2 diabetes mellitus ([place]) 05/22/2022   Centrilobular emphysema ([place]) 07/09/2020   Hypercalcemia 07/05/2020   Simple chronic bronchitis ([place]) 02/22/2020   Hx of colonic polyps    Hepatomegaly 07/23/2018   Fatty liver 07/23/2018   Atherosclerosis of abdominal aorta ([place]) 07/09/2018   Diabetes mellitus type 2, controlled, with complications ([place]) 94/32/7614   Nasal polyp 09/03/2017   Lipoma of back 09/03/2017   Claudication ([place]) 06/04/2017   LVH (left ventricular hypertrophy) 02/27/2017   Decreased cardiac ejection fraction 02/27/2017   Left hand weakness 10/12/2016   Elevated antinuclear antibody (ANA) level 04/27/2016   Angina pectoris ([place]) 11/01/2015   Well controlled type 2 diabetes mellitus with gastroparesis ([place]) 06/22/2015   Low TSH level 06/22/2015   Iron deficiency anemia 04/07/2015   Intestinal metaplasia of gastric mucosa 03/11/2015   CAD (coronary artery disease), native coronary artery 02/24/2015   History of coronary artery stent placement 02/24/2015   Chronic kidney disease,  stage 3, mod decreased GFR ([place]) 02/24/2015   Menopause 70/92/9574   History of Helicobacter pylori infection 02/24/2015   Mild mitral insufficiency 02/24/2015   Mild tricuspid insufficiency 02/24/2015   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 ([place]) 02/24/2015   Controlled gout 02/24/2015   Depression, major, recurrent, mild ([place]) 02/24/2015   Mild pulmonary hypertension ([place]) 02/24/2015   Gastroesophageal reflux disease without esophagitis 02/24/2015   Perennial allergic rhinitis 02/24/2015   HLD (hyperlipidemia) 02/20/2015   Hypertension, benign 02/20/2015   Obstructive sleep apnea syndrome 02/20/2015   Controlled diabetes mellitus with stage 3 chronic kidney disease, without long-term current use of insulin ([place]) 02/20/2015    Past Surgical History:  Procedure Laterality Date   ABDOMINAL HYSTERECTOMY     total   BREAST BIOPSY Right 06/07/2022   stereo bx, calcs, COIL clip-path pending   CATARACT EXTRACTION     CATARACT EXTRACTION W/PHACO Left 03/24/2019   Procedure: CATARACT EXTRACTION PHACO AND INTRAOCULAR LENS PLACEMENT ([place])  LEFT DIABETIC;  Surgeon: Eulogio Bear, MD;  Location: [facility];  Service: Ophthalmology;  Laterality: Left;  Diabetic - insulin and oral meds sleep apnea   COLONOSCOPY  01/02/2014   sigmoid diverticulosis. desc colon TA, hyperplastic polyp   COLONOSCOPY WITH PROPOFOL N/A 09/02/2018   Procedure: COLONOSCOPY WITH PROPOFOL;  Surgeon: Lucilla Lame, MD;  Location: [facility];  Service: Endoscopy;  Laterality: N/A;   CORONARY ANGIOPLASTY WITH STENT PLACEMENT     ESOPHAGOGASTRODUODENOSCOPY N/A 02/16/2015   negative h pylori, focal gastic intestinal metaplasia   ESOPHAGOGASTRODUODENOSCOPY (EGD) WITH  PROPOFOL N/A 09/02/2018   Procedure: ESOPHAGOGASTRODUODENOSCOPY (EGD) WITH BIOPSIES;  Surgeon: Lucilla Lame, MD;  Location: [facility];  Service: Endoscopy;  Laterality: N/A;  diabetic -insulin and oral  meds sleep apnea   TOTAL KNEE ARTHROPLASTY Right     Family History  Problem Relation Age of Onset   Stroke Sister    Hyperlipidemia Sister    Alcohol abuse Brother    Diabetes Brother    Stroke Brother    Hypertension Mother    Diabetes Mother    Heart disease Mother    Diabetes Father    Heart disease Father    Hypertension Father    Hypertension Sister    Cancer Maternal Grandmother        Unsure   Diabetes Maternal Grandfather    Colon cancer Neg Hx    Liver disease Neg Hx    Breast cancer Neg Hx     Social History   Tobacco Use   Smoking status: Former    Packs/day: 1.00    Years: 40.00    Total pack years: 40.00    Types: Cigarettes    Start date: 09/04/1972    Quit date: 12/30/2012    Years since quitting: 9.4   Smokeless tobacco: Never   Tobacco comments:    smoking cessation materials not required  Substance Use Topics   Alcohol use: No    Alcohol/week: 0.0 standard drinks of alcohol     Current Outpatient Medications:    ACCU-CHEK FASTCLIX LANCETS MISC, , Disp: , Rfl:    ACCU-CHEK SMARTVIEW test strip, Check fsbs two times daily  DMII, Disp: 100 each, Rfl: 6   Alcohol Swabs (B-D SINGLE USE SWABS REGULAR) PADS, 1 each by Does not apply route 4 (four) times daily., Disp: 200 each, Rfl: 2   allopurinol (ZYLOPRIM) 100 MG tablet, TAKE 1 TABLET EVERY DAY, Disp: 90 tablet, Rfl: 1   amLODipine (NORVASC) 2.5 MG tablet, Take 1 tablet (2.5 mg total) by mouth daily., Disp: 90 tablet, Rfl: 1   aspirin EC 81 MG tablet, Take 81 mg by mouth every morning. , Disp: , Rfl:    BD INSULIN SYRINGE U/F 31G X 5/16" 0.3 ML MISC, , Disp: , Rfl:    blood glucose meter kit and supplies KIT, Dispense based on patient and insurance preference. Use up to four times daily as directed. (FOR ICD-9 250.00, 250.01)., Disp: 1 each, Rfl: 0   Cholecalciferol (VITAMIN D3) 50 MCG (2000 UT) TABS, Take 2,000 Units by mouth daily., Disp: , Rfl:    cloNIDine (CATAPRES) 0.1 MG tablet, TAKE 1 TABLET  (0.1 MG TOTAL) BY MOUTH EVERY EVENING., Disp: 90 tablet, Rfl: 1   Coenzyme Q10 (CO Q-10) 100 MG CAPS, TAKE 1 CAPSULE EVERY DAY, Disp: 90 capsule, Rfl: 1   estradiol (ESTRACE) 0.5 MG tablet, TAKE 1 TABLET EVERY DAY, Disp: 90 tablet, Rfl: 1   Ferrous Sulfate (IRON) 325 (65 Fe) MG TABS, Take 325 mg by mouth daily. Nature made brand-, Disp: , Rfl:    fluticasone-salmeterol (ADVAIR) 250-50 MCG/ACT AEPB, INHALE 1 PUFF INTO THE LUNGS IN THE MORNING AND AT BEDTIME., Disp: 180 each, Rfl: 1   Insulin Glargine (BASAGLAR KWIKPEN) 100 UNIT/ML SOPN, Inject 60 Units into the skin at bedtime., Disp: , Rfl:    Insulin Pen Needle (FIFTY50 PEN NEEDLES) 31G X 5 MM MISC, Use once daily, Disp: , Rfl:    ipratropium (ATROVENT) 0.03 % nasal spray, Place 2 sprays into both nostrils every 12 (twelve)  hours., Disp: 90 mL, Rfl: 1   isosorbide mononitrate (IMDUR) 60 MG 24 hr tablet, TAKE 1 TABLET EVERY DAY, Disp: 90 tablet, Rfl: 1   loratadine (CLARITIN) 10 MG tablet, TAKE 1 TABLET EVERY DAY, Disp: 90 tablet, Rfl: 1   losartan (COZAAR) 50 MG tablet, Take 1 tablet (50 mg total) by mouth daily., Disp: 90 tablet, Rfl: 1   metoCLOPramide (REGLAN) 5 MG tablet, Take 1 tablet (5 mg total) by mouth 3 (three) times daily before meals. TAKE 1 TO 2 TABLETS FOUR TIMES DAILY BEFORE MEALS  AND AT BEDTIME, Disp: 270 tablet, Rfl: 0   metoprolol succinate (TOPROL-XL) 50 MG 24 hr tablet, Take 1 tablet (50 mg total) by mouth daily. Take with or immediately following a meal., Disp: 90 tablet, Rfl: 1   montelukast (SINGULAIR) 10 MG tablet, Take 1 tablet (10 mg total) by mouth every evening., Disp: 90 tablet, Rfl: 1   pantoprazole (PROTONIX) 40 MG tablet, TAKE 1 TABLET (40 MG TOTAL) BY MOUTH EVERY MORNING., Disp: 90 tablet, Rfl: 1   rosuvastatin (CRESTOR) 20 MG tablet, TAKE 1 TABLET EVERY DAY, Disp: 90 tablet, Rfl: 1   SYNJARDY XR 12.01-999 MG TB24, Take 2 tablets by mouth daily., Disp: , Rfl:    TRULICITY 8.46 KZ/9.9JT SOPN, , Disp: , Rfl:     venlafaxine XR (EFFEXOR-XR) 75 MG 24 hr capsule, TAKE 1 CAPSULE EVERY DAY, Disp: 90 capsule, Rfl: 1  Allergies  Allergen Reactions   Codeine Other (See Comments)    Unknown reaction   Contrast Media [Iodinated Contrast Media] Itching   Lisinopril Cough   Sulfa Antibiotics Itching    I personally reviewed active problem list, medication list, allergies, family history, social history, health maintenance with the patient/caregiver today.   ROS   Ten systems reviewed and is negative except as mentioned in HPI   Objective  Vitals:   06/09/22 1105  BP: 122/68  Pulse: 89  Resp: 16  SpO2: 98%  Weight: 209 lb (94.8 kg)  Height: '5\' 5"'  (1.651 m)    Body mass index is 34.78 kg/m.  Physical Exam  Constitutional: Patient appears well-developed and well-nourished. Obese  No distress.  HEENT: head atraumatic, normocephalic, pupils equal and reactive to light, neck supple Cardiovascular: Normal rate, regular rhythm and normal heart sounds.  No murmur heard. No BLE edema. Pulmonary/Chest: Effort normal and breath sounds normal. No respiratory distress. Abdominal: Soft.  There is no tenderness. Psychiatric: Patient has a normal mood and affect. behavior is normal. Judgment and thought content normal.   Recent Results (from the past 2160 hour(s))  Lipid panel     Status: None   Collection Time: 05/22/22 11:51 AM  Result Value Ref Range   Cholesterol 117 <200 mg/dL   HDL 51 > OR = 50 mg/dL   Triglycerides 75 <150 mg/dL   LDL Cholesterol (Calc) 50 mg/dL (calc)    Comment: Reference range: <100 . Desirable range <100 mg/dL for primary prevention;   <70 mg/dL for patients with CHD or diabetic patients  with > or = 2 CHD risk factors. Marland Kitchen LDL-C is now calculated using the Martin-Hopkins  calculation, which is a validated novel method providing  better accuracy than the Friedewald equation in the  estimation of LDL-C.  Cresenciano Genre et al. Annamaria Helling. 7017;793(90): 2061-2068   (http://education.[employer].com/faq/FAQ164)    Total CHOL/HDL Ratio 2.3 <5.0 (calc)   Non-HDL Cholesterol (Calc) 66 <130 mg/dL (calc)    Comment: For patients with diabetes plus 1 major ASCVD risk  factor, treating to a non-HDL-C goal of <100 mg/dL  (LDL-C of <70 mg/dL) is considered a therapeutic  option.   COMPLETE METABOLIC PANEL WITH GFR     Status: Abnormal   Collection Time: 05/22/22 11:51 AM  Result Value Ref Range   Glucose, Bld 58 (L) 65 - 99 mg/dL    Comment: .            Fasting reference interval .    BUN 12 7 - 25 mg/dL   Creat 0.78 0.60 - 1.00 mg/dL   eGFR 82 > OR = 60 mL/min/1.64m   BUN/Creatinine Ratio SEE NOTE: 6 - 22 (calc)    Comment:    Not Reported: BUN and Creatinine are within    reference range. .    Sodium 141 135 - 146 mmol/L   Potassium 4.5 3.5 - 5.3 mmol/L   Chloride 107 98 - 110 mmol/L   CO2 25 20 - 32 mmol/L   Calcium 9.7 8.6 - 10.4 mg/dL   Total Protein 7.0 6.1 - 8.1 g/dL   Albumin 4.2 3.6 - 5.1 g/dL   Globulin 2.8 1.9 - 3.7 g/dL (calc)   AG Ratio 1.5 1.0 - 2.5 (calc)   Total Bilirubin 0.3 0.2 - 1.2 mg/dL   Alkaline phosphatase (APISO) 62 37 - 153 U/L   AST 13 10 - 35 U/L   ALT 10 6 - 29 U/L  Uric acid     Status: None   Collection Time: 05/22/22 11:51 AM  Result Value Ref Range   Uric Acid, Serum 3.1 2.5 - 7.0 mg/dL    Comment: Therapeutic target for gout patients: <6.0 mg/dL .   Surgical pathology     Status: None   Collection Time: 06/07/22 10:26 AM  Result Value Ref Range   SURGICAL PATHOLOGY      SURGICAL PATHOLOGY CASE: [id number]: MEllsworth LennoxSurgical Pathology Report     Specimen Submitted: A. Breast, right  Clinical History: Indeterminate right breast CALCS.  HX of multiple oval benign appearing masses FA/cyst.  Fibroadenomatoid CALCS, FCC, fat necrosis, malignancy. Coil clip      DIAGNOSIS: A.  BREAST CALCIFICATIONS, RIGHT LOWER OUTER MIDDLE DEPTH; STEREOTACTIC BIOPSY: - FIBROADENOMATOID  CHANGE WITH ASSOCIATED COARSE CALCIFICATIONS. - NEGATIVE FOR ATYPIA AND MALIGNANCY.  GROSS DESCRIPTION: A. Labeled: Right breast stereo biopsy lower outer middle depth calcs Received: in a formalin-filled [place] collection device Specimen radiograph image(s) available for review Time/Date in fixative: Collected at 10:26 AM on 06/07/2022 and placed in formalin at 10:28 AM on 06/07/2022 Cold ischemic time: Less than 5 minutes Total fixation time: Approximately 10.75 hours Core pieces: Multiple Measurement: Aggregate, 6 x 1.2 x 0.3 cm Description / c omments: Received are cores and fragments of yellow fibrofatty tissue.  The accompanying diagram in sections A, C, and E checked. Inked: Blue Entirely submitted in cassette(s):  1 - section A 2 - section C 3 - section E 4 - 5 - remaining tissue fragments      4 - sections B and D      5 - section F and remaining free-floating fragments  RB 06/07/2022  Final Diagnosis performed by TQuay Burow MD.   Electronically signed 06/08/2022 10:01:40AM The electronic signature indicates that the named Attending Pathologist has evaluated the specimen Technical component performed at [facility] [address] [place] [address]Lab: [phone]: SRush Farmer MD, MMM  Professional component [facility] at [facility] [address] [place] [address]Lab:  [phone] Dir: Kathi Simpers, MD     PHQ2/9:    06/09/2022   11:05 AM 05/22/2022   10:49 AM 02/10/2022    1:01 PM 12/27/2021    9:35 AM 12/22/2021    8:51 AM  Depression screen PHQ 2/9  Decreased Interest 0 0 0 0 0  Down, Depressed, Hopeless 0 0 0 0 0  PHQ - 2 Score 0 0 0 0 0  Altered sleeping 0 0 0 0   Tired, decreased energy 0 0 0 0   Change in appetite 0 0 0 0   Feeling bad or failure about yourself  0 0 0 0   Trouble concentrating 0 0 0 0   Moving slowly or fidgety/restless 0 0 0 0   Suicidal thoughts 0 0 0 0   PHQ-9 Score 0 0 0  0   Difficult doing work/chores   Not difficult at all      phq 9 is negative   Fall Risk:    06/09/2022   11:05 AM 05/22/2022   10:49 AM 02/10/2022    1:01 PM 12/27/2021    9:35 AM 12/22/2021    8:52 AM  Fall Risk   Falls in the past year? 0 0 0 0 0  Number falls in past yr: 0 0 0  0  Injury with Fall? 0 0 0  0  Risk for fall due to : No Fall Risks No Fall Risks   No Fall Risks  Follow up Falls prevention discussed Falls prevention discussed  Falls prevention discussed Falls prevention discussed      Functional Status Survey: Is the patient deaf or have difficulty hearing?: No Does the patient have difficulty seeing, even when wearing glasses/contacts?: No Does the patient have difficulty concentrating, remembering, or making decisions?: No Does the patient have difficulty walking or climbing stairs?: No Does the patient have difficulty dressing or bathing?: No Does the patient have difficulty doing errands alone such as visiting a doctor's office or shopping?: No    Assessment & Plan  1. Diabetes mellitus with gastroparesis (HCC)  - metoCLOPramide (REGLAN) 5 MG tablet; Take 1 tablet (5 mg total) by mouth 2 (two) times daily before a meal. TAKE 1 TO 2 TABLETS FOUR TIMES DAILY BEFORE MEALS  AND AT BEDTIME  Dispense: 180 tablet; Refill: 1  2. Vasomotor symptoms   - cloNIDine (CATAPRES) 0.1 MG tablet; Take 1 tablet (0.1 mg total) by mouth every evening.  Dispense: 90 tablet; Refill: 1  3. Myalgia  - Coenzyme Q10 (CO Q-10) 100 MG CAPS; Take 1 capsule by mouth daily.  Dispense: 90 capsule; Refill: 1  4. Perennial allergic rhinitis  - loratadine (CLARITIN) 10 MG tablet; Take 1 tablet (10 mg total) by mouth daily.  Dispense: 90 tablet; Refill: 1  5. Gastroesophageal reflux disease without esophagitis  - pantoprazole (PROTONIX) 40 MG tablet; Take 1 tablet (40 mg total) by mouth every morning.  Dispense: 90 tablet; Refill: 1

## 2022-06-09 ENCOUNTER — Ambulatory Visit (INDEPENDENT_AMBULATORY_CARE_PROVIDER_SITE_OTHER): Payer: Medicare PPO | Admitting: Family Medicine

## 2022-06-09 ENCOUNTER — Encounter: Payer: Self-pay | Admitting: Family Medicine

## 2022-06-09 ENCOUNTER — Ambulatory Visit: Payer: Medicare PPO | Admitting: Family Medicine

## 2022-06-09 DIAGNOSIS — E1143 Type 2 diabetes mellitus with diabetic autonomic (poly)neuropathy: Secondary | ICD-10-CM

## 2022-06-09 DIAGNOSIS — N951 Menopausal and female climacteric states: Secondary | ICD-10-CM

## 2022-06-09 DIAGNOSIS — J3089 Other allergic rhinitis: Secondary | ICD-10-CM | POA: Diagnosis not present

## 2022-06-09 DIAGNOSIS — K219 Gastro-esophageal reflux disease without esophagitis: Secondary | ICD-10-CM

## 2022-06-09 DIAGNOSIS — M791 Myalgia, unspecified site: Secondary | ICD-10-CM | POA: Diagnosis not present

## 2022-06-09 MED ORDER — PANTOPRAZOLE SODIUM 40 MG PO TBEC: 40.0000 mg | DELAYED_RELEASE_TABLET | Freq: Every morning | ORAL | 1 refills | Status: DC

## 2022-06-09 MED ORDER — CLONIDINE HCL 0.1 MG PO TABS: 0.1000 mg | ORAL_TABLET | Freq: Every evening | ORAL | 1 refills | Status: DC

## 2022-06-09 MED ORDER — CO Q-10 100 MG PO CAPS: 1.0000 | ORAL_CAPSULE | Freq: Every day | ORAL | 1 refills | Status: DC

## 2022-06-09 MED ORDER — METOCLOPRAMIDE HCL 5 MG PO TABS: 5.0000 mg | ORAL_TABLET | Freq: Two times a day (BID) | ORAL | 1 refills | Status: DC

## 2022-06-09 MED ORDER — LORATADINE 10 MG PO TABS: 10.0000 mg | ORAL_TABLET | Freq: Every day | ORAL | 1 refills | Status: DC

## 2022-06-22 DIAGNOSIS — E785 Hyperlipidemia, unspecified: Secondary | ICD-10-CM | POA: Diagnosis not present

## 2022-06-22 DIAGNOSIS — E1169 Type 2 diabetes mellitus with other specified complication: Secondary | ICD-10-CM | POA: Diagnosis not present

## 2022-06-22 LAB — BASIC METABOLIC PANEL
BUN: 11 (ref 4–21)
CO2: 27 — AB (ref 13–22)
Chloride: 109 — AB (ref 99–108)
Glucose: 91
Potassium: 4.2 mEq/L (ref 3.5–5.1)
Sodium: 141 (ref 137–147)

## 2022-06-22 LAB — LIPID PANEL
Cholesterol: 111 (ref 0–200)
HDL: 45 (ref 35–70)
LDL Cholesterol: 45
Triglycerides: 103 (ref 40–160)

## 2022-06-22 LAB — COMPREHENSIVE METABOLIC PANEL
Calcium: 9.4 (ref 8.7–10.7)
eGFR: 69

## 2022-06-22 LAB — MICROALBUMIN / CREATININE URINE RATIO: Microalb Creat Ratio: <6.2

## 2022-06-22 LAB — PROTEIN / CREATININE RATIO, URINE
Albumin, U: <7
Creatinine, Urine: 112.1

## 2022-06-22 LAB — HEMOGLOBIN A1C: Hemoglobin A1C: 6.3

## 2022-06-23 ENCOUNTER — Other Ambulatory Visit: Payer: Self-pay

## 2022-06-23 DIAGNOSIS — Z87891 Personal history of nicotine dependence: Secondary | ICD-10-CM

## 2022-06-23 DIAGNOSIS — Z122 Encounter for screening for malignant neoplasm of respiratory organs: Secondary | ICD-10-CM

## 2022-06-26 ENCOUNTER — Telehealth (INDEPENDENT_AMBULATORY_CARE_PROVIDER_SITE_OTHER): Payer: Medicare PPO | Admitting: Internal Medicine

## 2022-06-26 ENCOUNTER — Encounter: Payer: Self-pay | Admitting: Internal Medicine

## 2022-06-26 ENCOUNTER — Ambulatory Visit: Payer: Self-pay | Admitting: *Deleted

## 2022-06-26 DIAGNOSIS — R051 Acute cough: Secondary | ICD-10-CM

## 2022-06-26 DIAGNOSIS — J0100 Acute maxillary sinusitis, unspecified: Secondary | ICD-10-CM

## 2022-06-26 LAB — HM DIABETES FOOT EXAM

## 2022-06-26 MED ORDER — BENZONATATE 100 MG PO CAPS: 100.0000 mg | ORAL_CAPSULE | Freq: Two times a day (BID) | ORAL | 0 refills | Status: DC | PRN

## 2022-06-26 MED ORDER — FLUTICASONE PROPIONATE 50 MCG/ACT NA SUSP: 2.0000 | Freq: Every day | NASAL | 6 refills | Status: DC

## 2022-06-26 MED ORDER — AMOXICILLIN-POT CLAVULANATE 875-125 MG PO TABS: 1.0000 | ORAL_TABLET | Freq: Two times a day (BID) | ORAL | 0 refills | Status: AC

## 2022-06-26 NOTE — Progress Notes (Signed)
Virtual Visit via Video Note  I connected with Dana Sparks on 06/26/22 at  3:40 PM EDT by a video enabled telemedicine application and verified that I am speaking with the correct person using two identifiers.  Location: Patient: Home Provider: [facility]   I discussed the limitations of evaluation and management by telemedicine and the availability of in person appointments. The patient expressed understanding and agreed to proceed.  History of Present Illness:  Patient presenting via telemedicine for nasal congestion symptoms started 3 weeks ago.  URI Compliant:   -Fever: no -Cough: yes productive, yellow/green -Shortness of breath: no -Wheezing: no -Chest pain: no -Chest tightness: no -Chest congestion: no -Nasal congestion: yes -Runny nose: yes -Post nasal drip: no -Sneezing: yes -Sore throat: no -Sinus pressure: yes -Headache: yes -Face pain: yes, left -Ear pain: no  -Ear pressure: yes bilateral -Sick contacts: no -Context: worse -Recurrent sinusitis: no -Relief with OTC cold/cough medications: no  -Treatments attempted: cold/sinus and pseudoephedrine, Tylenol    Observations/Objective:  General: well appearing, no acute distress ENT: conjunctiva normal appearing bilaterally, congested sounding Skin: no rashes, cyanosis or abnormal bruising noted Neuro: Answers all questions appropriately.  Assessment and Plan:  1. Acute non-recurrent maxillary sinusitis/Acute cough: Symptoms going on about 3 weeks with worsening nasal congestion and left maxillary sinus pain, will treat with Augmentin 875 twice daily x5 days as well as nasal steroid to help with congestion symptoms.  Cough suppressant was also sent to her pharmacy.  If patient's symptoms worsen or fail to improve, she will call for a in person examination.  - amoxicillin-clavulanate (AUGMENTIN) 875-125 MG tablet; Take 1 tablet by mouth 2 (two) times daily for 5 days.  Dispense: 10 tablet; Refill: 0 - fluticasone  (FLONASE) 50 MCG/ACT nasal spray; Place 2 sprays into both nostrils daily.  Dispense: 16 g; Refill: 6 - benzonatate (TESSALON) 100 MG capsule; Take 1 capsule (100 mg total) by mouth 2 (two) times daily as needed for cough.  Dispense: 20 capsule; Refill: 0   Follow Up Instructions: If symptoms worsen or fail to improve    I discussed the assessment and treatment plan with the patient. The patient was provided an opportunity to ask questions and all were answered. The patient agreed with the plan and demonstrated an understanding of the instructions.   The patient was advised to call back or seek an in-person evaluation if the symptoms worsen or if the condition fails to improve as anticipated.  I provided 8 minutes of non-face-to-face time during this encounter.   Teodora Medici, DO

## 2022-06-26 NOTE — Telephone Encounter (Signed)
Summary: Possible sinus infection   Pt called to report that she has been experiencing sinus infection symptoms, no appt soon enough she says she was just seen earlier this year  [phone]       Chief Complaint: sinusitis Symptoms: green yellow mucous Frequency: 2 weeks Pertinent Negatives: Patient denies fever Disposition: '[]'$ ED /'[]'$ Urgent Care (no appt availability in office) / '[x]'$ Appointment(In office/virtual)/ '[]'$  [facility]/ '[]'$ Home Care/ '[]'$ Refused Recommended Disposition /'[]'$ [facility]/ '[]'$  Follow-up with PCP Additional Notes: MyChart office appt made, home care discussed.   Reason for Disposition  [1] Sinus pain (not just congestion) AND [2] fever  Answer Assessment - Initial Assessment Questions 1. LOCATION: "Where does it hurt?"      On face, around eyes 2. ONSET: "When did the sinus pain start?"  (e.g., hours, days)      2 weeks 3. SEVERITY: "How bad is the pain?"   (Scale 1-10; mild, moderate or severe)   - MILD (1-3): doesn't interfere with normal activities    - MODERATE (4-7): interferes with normal activities (e.g., work or school) or awakens from sleep   - SEVERE (8-10): excruciating pain and patient unable to do any normal activities        8 4. RECURRENT SYMPTOM: "Have you ever had sinus problems before?" If Yes, ask: "When was the last time?" and "What happened that time?"      Yes, it has been a while 5. NASAL CONGESTION: "Is the nose blocked?" If Yes, ask: "Can you open it or must you breathe through your mouth?"     Green and yellow mucous and sputum 6. NASAL DISCHARGE: "Do you have discharge from your nose?" If so ask, "What color?"     Green and yellow mucous and sputum 7. FEVER: "Do you have a fever?" If Yes, ask: "What is it, how was it measured, and when did it start?"      no 8. OTHER SYMPTOMS: "Do you have any other symptoms?" (e.g., sore throat, cough, earache, difficulty breathing)     Ears pop 9. PREGNANCY: "Is there any  chance you are pregnant?" "When was your last menstrual period?"     no  Protocols used: Sinus Pain or Congestion-A-AH

## 2022-06-28 DIAGNOSIS — E669 Obesity, unspecified: Secondary | ICD-10-CM | POA: Diagnosis not present

## 2022-06-28 DIAGNOSIS — E785 Hyperlipidemia, unspecified: Secondary | ICD-10-CM | POA: Diagnosis not present

## 2022-06-28 DIAGNOSIS — E1159 Type 2 diabetes mellitus with other circulatory complications: Secondary | ICD-10-CM | POA: Diagnosis not present

## 2022-06-28 DIAGNOSIS — E1169 Type 2 diabetes mellitus with other specified complication: Secondary | ICD-10-CM | POA: Diagnosis not present

## 2022-06-28 DIAGNOSIS — E1142 Type 2 diabetes mellitus with diabetic polyneuropathy: Secondary | ICD-10-CM | POA: Diagnosis not present

## 2022-06-28 DIAGNOSIS — Z794 Long term (current) use of insulin: Secondary | ICD-10-CM | POA: Diagnosis not present

## 2022-08-11 ENCOUNTER — Other Ambulatory Visit: Payer: Self-pay

## 2022-08-11 ENCOUNTER — Other Ambulatory Visit: Payer: Self-pay | Admitting: Family Medicine

## 2022-08-11 MED ORDER — IPRATROPIUM BROMIDE 0.03 % NA SOLN: 2.0000 | Freq: Two times a day (BID) | NASAL | 1 refills | Status: AC

## 2022-11-17 NOTE — Progress Notes (Unsigned)
Name: Dana Sparks   MRN: [id number]    DOB: [birth year]   Date:11/20/2022       Progress Note  Subjective  Chief Complaint  Follow Up  HPI  Obesity: BMI below 35, eating healthier, weight is stable since last visit    Hyperlipidemia/Atherosclerosis of aorta: taking statin therapy, no muscle problems, reviewed last labs with patient , LDL 45  , continue medication, denies myalgias .   Controlled gout: doing well , no symptoms lately , continue allopurinol   DMII with gastroparesis/CKI/neuropathy: she is taking medication as prescribed, seeing Dr. Gabriel Carina.  Her hgbA1C  was 7.8% to 8.2%, 8.2%  6.8 % 6.3 % 6.4 % 6.1 % 6.2% and last visit with Dr. Gabriel Carina 6.3 % She is on Trulicity, Synjardi and Basaglar 60 units, she states since she started taking Synjardi at night, nausea and indigestion resolved. She is getting it through [employer] and has been compliant with mediation. She has gastroparesis controlled with reglan before meals, dyslipidemia on crestor, on ARB for microalbuminuria. She has intermittent  polyphagia, she denies  polyuria or polydipsia. Eye exam is up to date. She has vascular disease and intermittent claudication. She is still taking aspirin daily, glucose 80-120 's and denies hypoglycemic episodes.   HTN: she was on Norvasc 2.5 mg , Losartan and Toprol xl, bp is towards low end of normal, but she denies orthostatic changes and we will continue current regiment for now    CAD with stable angina controlled with medications:  her cardiologist is Dr. Clayborn Bigness. She takes statin therapy, aspirin , beta blocker and ARB  and Imdur , angina under control with medical management . She was on disability for heart disease for years but in social security now.  Last  Echo from 2016 showed decreased in EF 25-30% and global hypokinesis , last echo was greatly improved with EF of 55 % , she is taking SGL-2 agonist . She has orthopnea, uses two pillows . Declines lower extremity edema     FINDINGS: Myoview done 10/21/2020  EF 64 % Regional wall motion:  reveals normal myocardial thickening and wall  motion.  The overall quality of the study is good.    Artifacts noted: no  Left ventricular cavity: normal.   Perfusion Analysis:  SPECT images demonstrate homogeneous tracer  distribution throughout the myocardium. Defect type : Normal     OSA: she stopping using her CPAP machine, explained she has pulmonary hypertension needs to wear CPAP 7 nights for at least 4 hours .    Major Depression in remission  she is doing well on Effexor, phq 9 is normal today, no side effects of medication . She states feeling well for a while but wants to continue medication since it also helps with hot flashes    Hyperlipidemia: taking statin therapy, no muscle problems lately and LDL is at goal    Chronic bronchitis/ Emphysema :  she used to smoke 1 pack daily for 50 years, quit smoking in 2014 she is now on Advair and no longer a productive cough in am, no SOB or wheezing She needs to schedule her lung cancer scree, I will give her the contact information    PVD with claudication/Atherosclerosis of aorta: on aspirin and statin therapy, she has not been physically active lately to know how bad claudication is at this time. Does not want to see another doctor at this [facility]  Flashes: she is on Effexor , Premarin, black cohosh,  and clonidine, a few years ago her symptoms were severe when she stopped Effexor. Stable     Patient Active Problem List   Diagnosis Date Noted   Major depression in remission ([place]) 05/22/2022   Controlled type 2 diabetes mellitus with complication, with long-term current use of insulin ([place]) 05/22/2022   Dyslipidemia associated with type 2 diabetes mellitus ([place]) 05/22/2022   Centrilobular emphysema ([place]) 07/09/2020   Hypercalcemia 07/05/2020   Simple chronic bronchitis ([place]) 02/22/2020   Hx of colonic polyps    Hepatomegaly 07/23/2018   Fatty liver  07/23/2018   Atherosclerosis of abdominal aorta ([place]) 07/09/2018   Diabetes mellitus type 2, controlled, with complications ([place]) [id number]   Nasal polyp 09/03/2017   Lipoma of back 09/03/2017   Claudication ([place]) 06/04/2017   LVH (left ventricular hypertrophy) 02/27/2017   Decreased cardiac ejection fraction 02/27/2017   Left hand weakness 10/12/2016   Elevated antinuclear antibody (ANA) level 04/27/2016   Angina pectoris ([place]) 11/01/2015   Well controlled type 2 diabetes mellitus with gastroparesis ([place]) 06/22/2015   Low TSH level 06/22/2015   Iron deficiency anemia 04/07/2015   Intestinal metaplasia of gastric mucosa 03/11/2015   CAD (coronary artery disease), native coronary artery 02/24/2015   History of coronary artery stent placement 02/24/2015   Chronic kidney disease, stage 3, mod decreased GFR ([place]) 02/24/2015   Menopause [id number]   History of Helicobacter pylori infection 02/24/2015   Mild mitral insufficiency 02/24/2015   Mild tricuspid insufficiency 02/24/2015   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 ([place]) 02/24/2015   Controlled gout 02/24/2015   Depression, major, recurrent, mild ([place]) 02/24/2015   Mild pulmonary hypertension ([place]) 02/24/2015   Gastroesophageal reflux disease without esophagitis 02/24/2015   Perennial allergic rhinitis 02/24/2015   HLD (hyperlipidemia) 02/20/2015   Hypertension, benign 02/20/2015   Obstructive sleep apnea syndrome 02/20/2015   Controlled diabetes mellitus with stage 3 chronic kidney disease, without long-term current use of insulin ([place]) 02/20/2015    Past Surgical History:  Procedure Laterality Date   ABDOMINAL HYSTERECTOMY     total   BREAST BIOPSY Right 06/07/2022   stereo bx, calcs, COIL clip-path pending   CATARACT EXTRACTION     CATARACT EXTRACTION W/PHACO Left 03/24/2019   Procedure: CATARACT EXTRACTION PHACO AND INTRAOCULAR LENS PLACEMENT ([place])  LEFT DIABETIC;  Surgeon: Eulogio Bear,  MD;  Location: [place];  Service: Ophthalmology;  Laterality: Left;  Diabetic - insulin and oral meds sleep apnea   COLONOSCOPY  01/02/2014   sigmoid diverticulosis. desc colon TA, hyperplastic polyp   COLONOSCOPY WITH PROPOFOL N/A 09/02/2018   Procedure: COLONOSCOPY WITH PROPOFOL;  Surgeon: Lucilla Lame, MD;  Location: [place];  Service: Endoscopy;  Laterality: N/A;   CORONARY ANGIOPLASTY WITH STENT PLACEMENT     ESOPHAGOGASTRODUODENOSCOPY N/A 02/16/2015   negative h pylori, focal gastic intestinal metaplasia   ESOPHAGOGASTRODUODENOSCOPY (EGD) WITH PROPOFOL N/A 09/02/2018   Procedure: ESOPHAGOGASTRODUODENOSCOPY (EGD) WITH BIOPSIES;  Surgeon: Lucilla Lame, MD;  Location: [place];  Service: Endoscopy;  Laterality: N/A;  diabetic -insulin and oral meds sleep apnea   TOTAL KNEE ARTHROPLASTY Right     Family History  Problem Relation Age of Onset   Stroke Sister    Hyperlipidemia Sister    Alcohol abuse Brother    Diabetes Brother    Stroke Brother    Hypertension Mother    Diabetes Mother    Heart disease Mother    Diabetes Father    Heart disease Father  Hypertension Father    Hypertension Sister    Cancer Maternal Grandmother        Unsure   Diabetes Maternal Grandfather    Colon cancer Neg Hx    Liver disease Neg Hx    Breast cancer Neg Hx     Social History   Tobacco Use   Smoking status: Former    Packs/day: 1.00    Years: 40.00    Additional pack years: 0.00    Total pack years: 40.00    Types: Cigarettes    Start date: 09/04/1972    Quit date: 12/30/2012    Years since quitting: 9.8   Smokeless tobacco: Never   Tobacco comments:    smoking cessation materials not required  Substance Use Topics   Alcohol use: No    Alcohol/week: 0.0 standard drinks of alcohol     Current Outpatient Medications:    ACCU-CHEK FASTCLIX LANCETS MISC, , Disp: , Rfl:    ACCU-CHEK SMARTVIEW test strip, Check fsbs two times daily  DMII, Disp:  100 each, Rfl: 6   Alcohol Swabs (B-D SINGLE USE SWABS REGULAR) PADS, 1 each by Does not apply route 4 (four) times daily., Disp: 200 each, Rfl: 2   aspirin EC 81 MG tablet, Take 81 mg by mouth every morning. , Disp: , Rfl:    BD INSULIN SYRINGE U/F 31G X 5/16" 0.3 ML MISC, , Disp: , Rfl:    blood glucose meter kit and supplies KIT, Dispense based on patient and insurance preference. Use up to four times daily as directed. (FOR ICD-9 250.00, 250.01)., Disp: 1 each, Rfl: 0   Cholecalciferol (VITAMIN D3) 50 MCG (2000 UT) TABS, Take 2,000 Units by mouth daily., Disp: , Rfl:    Coenzyme Q10 (CO Q-10) 100 MG CAPS, Take 1 capsule by mouth daily., Disp: 90 capsule, Rfl: 1   Ferrous Sulfate (IRON) 325 (65 Fe) MG TABS, Take 325 mg by mouth daily. Nature made brand-, Disp: , Rfl:    Insulin Glargine (BASAGLAR KWIKPEN) 100 UNIT/ML SOPN, Inject 60 Units into the skin at bedtime., Disp: , Rfl:    Insulin Pen Needle (FIFTY50 PEN NEEDLES) 31G X 5 MM MISC, Use once daily, Disp: , Rfl:    ipratropium (ATROVENT) 0.03 % nasal spray, Place 2 sprays into both nostrils every 12 (twelve) hours., Disp: 90 mL, Rfl: 1   metoCLOPramide (REGLAN) 5 MG tablet, Take 1 tablet (5 mg total) by mouth 2 (two) times daily before a meal. TAKE 1 TO 2 TABLETS FOUR TIMES DAILY BEFORE MEALS  AND AT BEDTIME, Disp: 180 tablet, Rfl: 1   SYNJARDY XR 12.01-999 MG TB24, Take 2 tablets by mouth daily., Disp: , Rfl:    TRULICITY [id number] SOPN, , Disp: , Rfl:    allopurinol (ZYLOPRIM) 100 MG tablet, Take 1 tablet (100 mg total) by mouth daily., Disp: 90 tablet, Rfl: 1   amLODipine (NORVASC) 2.5 MG tablet, Take 1 tablet (2.5 mg total) by mouth daily., Disp: 90 tablet, Rfl: 1   cloNIDine (CATAPRES) 0.1 MG tablet, Take 1 tablet (0.1 mg total) by mouth every evening., Disp: 90 tablet, Rfl: 1   estradiol (ESTRACE) 0.5 MG tablet, Take 1 tablet (0.5 mg total) by mouth daily., Disp: 90 tablet, Rfl: 1   fluticasone-salmeterol (ADVAIR) 250-50 MCG/ACT  AEPB, Inhale 1 puff into the lungs in the morning and at bedtime., Disp: 180 each, Rfl: 1   isosorbide mononitrate (IMDUR) 60 MG 24 hr tablet, Take 1 tablet (60 mg total)  by mouth daily., Disp: 90 tablet, Rfl: 1   loratadine (CLARITIN) 10 MG tablet, Take 1 tablet (10 mg total) by mouth daily., Disp: 90 tablet, Rfl: 1   losartan (COZAAR) 50 MG tablet, Take 1 tablet (50 mg total) by mouth daily., Disp: 90 tablet, Rfl: 1   metoprolol succinate (TOPROL-XL) 50 MG 24 hr tablet, Take 1 tablet (50 mg total) by mouth daily. Take with or immediately following a meal., Disp: 90 tablet, Rfl: 1   montelukast (SINGULAIR) 10 MG tablet, Take 1 tablet (10 mg total) by mouth every evening., Disp: 90 tablet, Rfl: 1   pantoprazole (PROTONIX) 40 MG tablet, Take 1 tablet (40 mg total) by mouth every morning., Disp: 90 tablet, Rfl: 1   rosuvastatin (CRESTOR) 20 MG tablet, Take 1 tablet (20 mg total) by mouth daily., Disp: 90 tablet, Rfl: 1   venlafaxine XR (EFFEXOR-XR) 75 MG 24 hr capsule, Take 1 capsule (75 mg total) by mouth daily., Disp: 90 capsule, Rfl: 1  Allergies  Allergen Reactions   Codeine Other (See Comments)    Unknown reaction   Contrast Media [Iodinated Contrast Media] Itching   Lisinopril Cough   Sulfa Antibiotics Itching    I personally reviewed active problem list, medication list, allergies, family history, social history, health maintenance with the patient/caregiver today.   ROS  Constitutional: Negative for fever or weight change.  Respiratory: Negative for cough and shortness of breath.   Cardiovascular: Negative for chest pain or palpitations.  Gastrointestinal: Negative for abdominal pain, no bowel changes.  Musculoskeletal: Negative for gait problem or joint swelling.  Skin: Negative for rash.  Neurological: Negative for dizziness or headache.  No other specific complaints in a complete review of systems (except as listed in HPI above).   Objective  Vitals:   11/20/22 1053  BP:  112/72  Pulse: 87  Resp: 16  Temp: 97.8 F (36.6 C)  TempSrc: Oral  SpO2: 93%  Weight: 210 lb 11.2 oz (95.6 kg)  Height: 5' 5.5" (1.664 m)    Body mass index is 34.53 kg/m.  Physical Exam  Constitutional: Negative for fever or weight change.  Respiratory: Negative for cough and shortness of breath.   Cardiovascular: Negative for chest pain or palpitations.  Gastrointestinal: Negative for abdominal pain, no bowel changes.  Musculoskeletal: Negative for gait problem or joint swelling.  Skin: Negative for rash.  Neurological: Negative for dizziness or headache.  No other specific complaints in a complete review of systems (except as listed in HPI above).     PHQ2/9:    11/20/2022   10:55 AM 06/26/2022    3:28 PM 06/09/2022   11:05 AM 05/22/2022   10:49 AM 02/10/2022    1:01 PM  Depression screen PHQ 2/9  Decreased Interest 0 0 0 0 0  Down, Depressed, Hopeless 0 0 0 0 0  PHQ - 2 Score 0 0 0 0 0  Altered sleeping 0 0 0 0 0  Tired, decreased energy 0 0 0 0 0  Change in appetite 0 0 0 0 0  Feeling bad or failure about yourself  0 0 0 0 0  Trouble concentrating 0 0 0 0 0  Moving slowly or fidgety/restless 0 0 0 0 0  Suicidal thoughts 0 0 0 0 0  PHQ-9 Score 0 0 0 0 0  Difficult doing work/chores  Not difficult at all   Not difficult at all    phq 9 is negative   Fall Risk:  11/20/2022   10:55 AM 06/26/2022    3:28 PM 06/09/2022   11:05 AM 05/22/2022   10:49 AM 02/10/2022    1:01 PM  Fall Risk   Falls in the past year? 0 0 0 0 0  Number falls in past yr:  0 0 0 0  Injury with Fall?  0 0 0 0  Risk for fall due to : No Fall Risks  No Fall Risks No Fall Risks   Follow up Falls prevention discussed;Education provided;Falls evaluation completed  Falls prevention discussed Falls prevention discussed       Functional Status Survey: Is the patient deaf or have difficulty hearing?: No Does the patient have difficulty seeing, even when wearing glasses/contacts?: No Does  the patient have difficulty concentrating, remembering, or making decisions?: No Does the patient have difficulty walking or climbing stairs?: No Does the patient have difficulty dressing or bathing?: No Does the patient have difficulty doing errands alone such as visiting a doctor's office or shopping?: No    Assessment & Plan  1. Controlled type 2 diabetes mellitus with complication, with long-term current use of insulin ([place])  Keep follow up with Dr. Gabriel Carina   2. Angina pectoris (HCC)  - isosorbide mononitrate (IMDUR) 60 MG 24 hr tablet; Take 1 tablet (60 mg total) by mouth daily.  Dispense: 90 tablet; Refill: 1  3. Claudication ([place])  stable  4. Mild pulmonary hypertension ([place])  Must resume CPAP  5. Atherosclerosis of abdominal aorta (HCC)  On statin therapy and aspirin  6. Dyslipidemia associated with type 2 diabetes mellitus ([place])  On statin therapy   7. Major depression in remission (HCC)  - venlafaxine XR (EFFEXOR-XR) 75 MG 24 hr capsule; Take 1 capsule (75 mg total) by mouth daily.  Dispense: 90 capsule; Refill: 1  8. Centrilobular emphysema ([place])  Needs to have repeat CT  9. Simple chronic bronchitis (HCC)  - fluticasone-salmeterol (ADVAIR) 250-50 MCG/ACT AEPB; Inhale 1 puff into the lungs in the morning and at bedtime.  Dispense: 180 each; Refill: 1  10. Perennial allergic rhinitis  - loratadine (CLARITIN) 10 MG tablet; Take 1 tablet (10 mg total) by mouth daily.  Dispense: 90 tablet; Refill: 1  11. Controlled gout  stable  12. Gastroesophageal reflux disease without esophagitis  - pantoprazole (PROTONIX) 40 MG tablet; Take 1 tablet (40 mg total) by mouth every morning.  Dispense: 90 tablet; Refill: 1  13. Obstructive sleep apnea syndrome  Needs to resume CPAP   14. Hypertension, benign  - amLODipine (NORVASC) 2.5 MG tablet; Take 1 tablet (2.5 mg total) by mouth daily.  Dispense: 90 tablet; Refill: 1 - losartan (COZAAR) 50 MG tablet; Take 1  tablet (50 mg total) by mouth daily.  Dispense: 90 tablet; Refill: 1 - metoprolol succinate (TOPROL-XL) 50 MG 24 hr tablet; Take 1 tablet (50 mg total) by mouth daily. Take with or immediately following a meal.  Dispense: 90 tablet; Refill: 1  15. Menopausal vasomotor syndrome  - cloNIDine (CATAPRES) 0.1 MG tablet; Take 1 tablet (0.1 mg total) by mouth every evening.  Dispense: 90 tablet; Refill: 1 - estradiol (ESTRACE) 0.5 MG tablet; Take 1 tablet (0.5 mg total) by mouth daily.  Dispense: 90 tablet; Refill: 1 - montelukast (SINGULAIR) 10 MG tablet; Take 1 tablet (10 mg total) by mouth every evening.  Dispense: 90 tablet; Refill: 1 - venlafaxine XR (EFFEXOR-XR) 75 MG 24 hr capsule; Take 1 capsule (75 mg total) by mouth daily.  Dispense: 90 capsule; Refill: 1  16. Dyslipidemia  - rosuvastatin (CRESTOR) 20 MG tablet; Take 1 tablet (20 mg total) by mouth daily.  Dispense: 90 tablet; Refill: 1  17. Breast cancer screening by mammogram  - MM 3D SCREENING MAMMOGRAM BILATERAL BREAST; Future

## 2022-11-20 ENCOUNTER — Ambulatory Visit (INDEPENDENT_AMBULATORY_CARE_PROVIDER_SITE_OTHER): Payer: Medicare PPO | Admitting: Family Medicine

## 2022-11-20 ENCOUNTER — Encounter: Payer: Self-pay | Admitting: Family Medicine

## 2022-11-20 VITALS — BP 112/72 | HR 87 | Temp 97.8°F | Resp 16 | Ht 65.5 in | Wt 210.7 lb

## 2022-11-20 DIAGNOSIS — J410 Simple chronic bronchitis: Secondary | ICD-10-CM | POA: Diagnosis not present

## 2022-11-20 DIAGNOSIS — E785 Hyperlipidemia, unspecified: Secondary | ICD-10-CM

## 2022-11-20 DIAGNOSIS — N951 Menopausal and female climacteric states: Secondary | ICD-10-CM

## 2022-11-20 DIAGNOSIS — I209 Angina pectoris, unspecified: Secondary | ICD-10-CM | POA: Diagnosis not present

## 2022-11-20 DIAGNOSIS — Z1231 Encounter for screening mammogram for malignant neoplasm of breast: Secondary | ICD-10-CM

## 2022-11-20 DIAGNOSIS — F325 Major depressive disorder, single episode, in full remission: Secondary | ICD-10-CM | POA: Diagnosis not present

## 2022-11-20 DIAGNOSIS — I739 Peripheral vascular disease, unspecified: Secondary | ICD-10-CM

## 2022-11-20 DIAGNOSIS — I700 Atherosclerosis of aorta: Secondary | ICD-10-CM | POA: Diagnosis not present

## 2022-11-20 DIAGNOSIS — K219 Gastro-esophageal reflux disease without esophagitis: Secondary | ICD-10-CM

## 2022-11-20 DIAGNOSIS — I272 Pulmonary hypertension, unspecified: Secondary | ICD-10-CM | POA: Diagnosis not present

## 2022-11-20 DIAGNOSIS — E1169 Type 2 diabetes mellitus with other specified complication: Secondary | ICD-10-CM | POA: Diagnosis not present

## 2022-11-20 DIAGNOSIS — G4733 Obstructive sleep apnea (adult) (pediatric): Secondary | ICD-10-CM

## 2022-11-20 DIAGNOSIS — Z794 Long term (current) use of insulin: Secondary | ICD-10-CM

## 2022-11-20 DIAGNOSIS — I10 Essential (primary) hypertension: Secondary | ICD-10-CM

## 2022-11-20 DIAGNOSIS — E118 Type 2 diabetes mellitus with unspecified complications: Secondary | ICD-10-CM | POA: Diagnosis not present

## 2022-11-20 DIAGNOSIS — J432 Centrilobular emphysema: Secondary | ICD-10-CM | POA: Diagnosis not present

## 2022-11-20 DIAGNOSIS — M109 Gout, unspecified: Secondary | ICD-10-CM

## 2022-11-20 DIAGNOSIS — J3089 Other allergic rhinitis: Secondary | ICD-10-CM

## 2022-11-20 MED ORDER — AMLODIPINE BESYLATE 2.5 MG PO TABS: 2.5000 mg | ORAL_TABLET | Freq: Every day | ORAL | 1 refills | Status: DC

## 2022-11-20 MED ORDER — FLUTICASONE-SALMETEROL 250-50 MCG/ACT IN AEPB: 1.0000 | INHALATION_SPRAY | Freq: Two times a day (BID) | RESPIRATORY_TRACT | 1 refills | Status: DC

## 2022-11-20 MED ORDER — PANTOPRAZOLE SODIUM 40 MG PO TBEC: 40.0000 mg | DELAYED_RELEASE_TABLET | Freq: Every morning | ORAL | 1 refills | Status: DC

## 2022-11-20 MED ORDER — MONTELUKAST SODIUM 10 MG PO TABS: 10.0000 mg | ORAL_TABLET | Freq: Every evening | ORAL | 1 refills | Status: DC

## 2022-11-20 MED ORDER — LOSARTAN POTASSIUM 50 MG PO TABS: 50.0000 mg | ORAL_TABLET | Freq: Every day | ORAL | 1 refills | Status: DC

## 2022-11-20 MED ORDER — ISOSORBIDE MONONITRATE ER 60 MG PO TB24: 60.0000 mg | ORAL_TABLET | Freq: Every day | ORAL | 1 refills | Status: DC

## 2022-11-20 MED ORDER — ALLOPURINOL 100 MG PO TABS: 100.0000 mg | ORAL_TABLET | Freq: Every day | ORAL | 1 refills | Status: DC

## 2022-11-20 MED ORDER — CLONIDINE HCL 0.1 MG PO TABS: 0.1000 mg | ORAL_TABLET | Freq: Every evening | ORAL | 1 refills | Status: DC

## 2022-11-20 MED ORDER — ROSUVASTATIN CALCIUM 20 MG PO TABS: 20.0000 mg | ORAL_TABLET | Freq: Every day | ORAL | 1 refills | Status: DC

## 2022-11-20 MED ORDER — VENLAFAXINE HCL ER 75 MG PO CP24: 75.0000 mg | ORAL_CAPSULE | Freq: Every day | ORAL | 1 refills | Status: DC

## 2022-11-20 MED ORDER — LORATADINE 10 MG PO TABS: 10.0000 mg | ORAL_TABLET | Freq: Every day | ORAL | 1 refills | Status: DC

## 2022-11-20 MED ORDER — ESTRADIOL 0.5 MG PO TABS: 0.5000 mg | ORAL_TABLET | Freq: Every day | ORAL | 1 refills | Status: DC

## 2022-11-20 MED ORDER — METOPROLOL SUCCINATE ER 50 MG PO TB24: 50.0000 mg | ORAL_TABLET | Freq: Every day | ORAL | 1 refills | Status: DC

## 2022-11-20 NOTE — Patient Instructions (Signed)
Magdalen Spatz, NP  [phone]  Please call to arrange lung cancer screening

## 2022-12-12 ENCOUNTER — Telehealth: Payer: Self-pay | Admitting: Family Medicine

## 2022-12-12 NOTE — Telephone Encounter (Signed)
Contacted Dana Sparks to schedule their annual wellness visit. Appointment made for 12/28/2022.  [facility] Care Guide [facility] AWV TEAM Direct Dial: [phone]

## 2022-12-26 DIAGNOSIS — E1159 Type 2 diabetes mellitus with other circulatory complications: Secondary | ICD-10-CM | POA: Diagnosis not present

## 2022-12-26 LAB — HEMOGLOBIN A1C: Hemoglobin A1C: 6.4

## 2023-01-03 DIAGNOSIS — M8580 Other specified disorders of bone density and structure, unspecified site: Secondary | ICD-10-CM | POA: Diagnosis not present

## 2023-01-03 DIAGNOSIS — E1142 Type 2 diabetes mellitus with diabetic polyneuropathy: Secondary | ICD-10-CM | POA: Diagnosis not present

## 2023-01-03 DIAGNOSIS — Z794 Long term (current) use of insulin: Secondary | ICD-10-CM | POA: Diagnosis not present

## 2023-01-03 DIAGNOSIS — E1169 Type 2 diabetes mellitus with other specified complication: Secondary | ICD-10-CM | POA: Diagnosis not present

## 2023-01-03 DIAGNOSIS — E785 Hyperlipidemia, unspecified: Secondary | ICD-10-CM | POA: Diagnosis not present

## 2023-01-03 DIAGNOSIS — E1159 Type 2 diabetes mellitus with other circulatory complications: Secondary | ICD-10-CM | POA: Diagnosis not present

## 2023-01-04 DIAGNOSIS — E119 Type 2 diabetes mellitus without complications: Secondary | ICD-10-CM | POA: Diagnosis not present

## 2023-01-04 DIAGNOSIS — H40003 Preglaucoma, unspecified, bilateral: Secondary | ICD-10-CM | POA: Diagnosis not present

## 2023-01-04 DIAGNOSIS — Z961 Presence of intraocular lens: Secondary | ICD-10-CM | POA: Diagnosis not present

## 2023-01-04 LAB — HM DIABETES EYE EXAM

## 2023-01-09 ENCOUNTER — Other Ambulatory Visit: Payer: Self-pay | Admitting: Family Medicine

## 2023-01-09 DIAGNOSIS — M791 Myalgia, unspecified site: Secondary | ICD-10-CM

## 2023-02-21 ENCOUNTER — Other Ambulatory Visit: Payer: Self-pay | Admitting: Family Medicine

## 2023-02-21 DIAGNOSIS — E1143 Type 2 diabetes mellitus with diabetic autonomic (poly)neuropathy: Secondary | ICD-10-CM

## 2023-05-01 ENCOUNTER — Ambulatory Visit
Admission: RE | Admit: 2023-05-01 | Discharge: 2023-05-01 | Disposition: A | Discharge status: Home / Self Care | Payer: Medicare PPO | Source: Ambulatory Visit | Attending: Family Medicine | Admitting: Family Medicine

## 2023-05-01 DIAGNOSIS — Z1231 Encounter for screening mammogram for malignant neoplasm of breast: Secondary | ICD-10-CM | POA: Diagnosis not present

## 2023-05-16 ENCOUNTER — Telehealth: Payer: Self-pay | Admitting: Family Medicine

## 2023-05-16 NOTE — Telephone Encounter (Signed)
No call from [place] has been made

## 2023-05-16 NOTE — Telephone Encounter (Signed)
Copied from CRM [id number]. Topic: General - Call Back - No Documentation >> May 16, 2023  1:29 PM Dana Sparks wrote: Patient called and stated she had a missed call, did not see any documentation of a call. Advised patient it could of been a reminder call for upcoming appt.

## 2023-05-22 NOTE — Progress Notes (Unsigned)
Name: Dana Sparks   MRN: [id number]    DOB: 1951-08-07   Date:05/23/2023       Progress Note  Subjective  Chief Complaint  Follow Up  HPI  Obesity: BMI below 35, eating healthier, weight is stable since last visit    Hyperlipidemia/Atherosclerosis of aorta: taking statin therapy,  reviewed last labs with patient , LDL 45  , continue medication.   Controlled gout: doing well , no symptoms lately , continue allopurinol , last level was normal   DMII with gastroparesis/CKI/neuropathy: she is taking medication as prescribed, seeing Dr. Tedd Sias.  Her hgbA1C  was 7.8% to 8.2%, 8.2%  6.8 % 6.3 % 6.4 % 6.1 % 6.2% , 6.3% and last visit it was 6.4 % She is on Trulicity 1.5 %  Synjardi and Basaglar 60 units. . She is getting it through [employer] and has been compliant with mediation. She has gastroparesis controlled with reglan before meals, dyslipidemia on crestor, on ARB for microalbuminuria. She has intermittent  polyphagia, she denies  polyuria or polydipsia. Eye exam is up to date. She has vascular disease and intermittent claudication. She is still taking aspirin daily. She is no longer drinking regular sodas and has been mindful about carbohydrate intake   HTN: she was on Norvasc 2.5 mg , Losartan and Toprol xl, bp is towards low end of normal, but she denies orthostatic changes and we will continue current regiment for now . Explained if any dizziness we will stop norvasc 2.5 mg since it is not cardiovascular protective    CAD with stable angina/Pulmonary HTN:  controlled with medications:  her cardiologist is Dr. Juliann Pares but lost to follow up and we will place a new referral  She takes statin therapy, aspirin , beta blocker and ARB  and Imdur , angina under control with medication  management   Last echo was greatly improved with EF of 55 % , she is taking SGL-2 agonist . She has orthopnea, uses two pillows . Declines lower extremity edema . Previous Echo from 2016 showed EF 20-30 %     FINDINGS: Myoview done 10/21/2020  EF 64 % Regional wall motion:  reveals normal myocardial thickening and wall  motion.  The overall quality of the study is good.    Artifacts noted: no  Left ventricular cavity: normal.   Perfusion Analysis:  SPECT images demonstrate homogeneous tracer  distribution throughout the myocardium. Defect type : Normal     OSA: she stopping using her CPAP machine, explained she has pulmonary hypertension needs to wear CPAP 7 nights for at least 4 hours . Unchanged    Major Depression in remission  she is doing well on Effexor, phq 9 is normal today, no side effects of medication . She continues medications since it helps with hot flashes    Chronic bronchitis/ Emphysema :  she used to smoke 1 pack daily for 50 years, quit smoking in 2014 she is now on Advair and no longer a productive cough in am, no SOB or wheezing . Last CT scan was 2021, ordered placed in 2023 but she did not have it done    PVD with claudication/Atherosclerosis of aorta: on aspirin and statin therapy, she has not been physically active lately to know how bad claudication is at this time. Unchanged , does not want to see vascular surgeon   Hot  Flashes: she is on Effexor , Premarin, black cohosh,  and clonidine, a few years ago her symptoms were  severe when she stopped Effexor. Unchanged   Patient Active Problem List   Diagnosis Date Noted   Major depression in remission (HCC) 05/22/2022   Controlled type 2 diabetes mellitus with complication, with long-term current use of insulin (HCC) 05/22/2022   Dyslipidemia associated with type 2 diabetes mellitus (HCC) 05/22/2022   Centrilobular emphysema (HCC) 07/09/2020   Hypercalcemia 07/05/2020   Simple chronic bronchitis (HCC) 02/22/2020   Hx of colonic polyps    Hepatomegaly 07/23/2018   Fatty liver 07/23/2018   Atherosclerosis of abdominal aorta (HCC) 07/09/2018   Diabetes mellitus type 2, controlled, with complications (HCC)  09/03/2017   Nasal polyp 09/03/2017   Lipoma of back 09/03/2017   Claudication (HCC) 06/04/2017   LVH (left ventricular hypertrophy) 02/27/2017   Decreased cardiac ejection fraction 02/27/2017   Left hand weakness 10/12/2016   Elevated antinuclear antibody (ANA) level 04/27/2016   Angina pectoris (HCC) 11/01/2015   Well controlled type 2 diabetes mellitus with gastroparesis (HCC) 06/22/2015   Low TSH level 06/22/2015   Iron deficiency anemia 04/07/2015   Intestinal metaplasia of gastric mucosa 03/11/2015   CAD (coronary artery disease), native coronary artery 02/24/2015   History of coronary artery stent placement 02/24/2015   Chronic kidney disease, stage 3, mod decreased GFR (HCC) 02/24/2015   Menopause 02/24/2015   History of Helicobacter pylori infection 02/24/2015   Mild mitral insufficiency 02/24/2015   Mild tricuspid insufficiency 02/24/2015   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 (HCC) 02/24/2015   Controlled gout 02/24/2015   Depression, major, recurrent, mild (HCC) 02/24/2015   Mild pulmonary hypertension (HCC) 02/24/2015   Gastroesophageal reflux disease without esophagitis 02/24/2015   Perennial allergic rhinitis 02/24/2015   HLD (hyperlipidemia) 02/20/2015   Hypertension, benign 02/20/2015   Obstructive sleep apnea syndrome 02/20/2015   Controlled diabetes mellitus with stage 3 chronic kidney disease, without long-term current use of insulin (HCC) 02/20/2015    Past Surgical History:  Procedure Laterality Date   ABDOMINAL HYSTERECTOMY     total   BREAST BIOPSY Right 06/07/2022   stereo bx, calcs, COIL clip-FIBROADENOMATOID CHANGE   CATARACT EXTRACTION     CATARACT EXTRACTION W/PHACO Left 03/24/2019   Procedure: CATARACT EXTRACTION PHACO AND INTRAOCULAR LENS PLACEMENT (IOC)  LEFT DIABETIC;  Surgeon: Nevada Crane, MD;  Location: [facility];  Service: Ophthalmology;  Laterality: Left;  Diabetic - insulin and oral meds sleep  apnea   COLONOSCOPY  01/02/2014   sigmoid diverticulosis. desc colon TA, hyperplastic polyp   COLONOSCOPY WITH PROPOFOL N/A 09/02/2018   Procedure: COLONOSCOPY WITH PROPOFOL;  Surgeon: Midge Minium, MD;  Location: [facility];  Service: Endoscopy;  Laterality: N/A;   CORONARY ANGIOPLASTY WITH STENT PLACEMENT     ESOPHAGOGASTRODUODENOSCOPY N/A 02/16/2015   negative h pylori, focal gastic intestinal metaplasia   ESOPHAGOGASTRODUODENOSCOPY (EGD) WITH PROPOFOL N/A 09/02/2018   Procedure: ESOPHAGOGASTRODUODENOSCOPY (EGD) WITH BIOPSIES;  Surgeon: Midge Minium, MD;  Location: [facility];  Service: Endoscopy;  Laterality: N/A;  diabetic -insulin and oral meds sleep apnea   TOTAL KNEE ARTHROPLASTY Right     Family History  Problem Relation Age of Onset   Stroke Sister    Hyperlipidemia Sister    Alcohol abuse Brother    Diabetes Brother    Stroke Brother    Hypertension Mother    Diabetes Mother    Heart disease Mother    Diabetes Father    Heart disease Father    Hypertension Father    Hypertension Sister  Cancer Maternal Grandmother        Unsure   Diabetes Maternal Grandfather    Colon cancer Neg Hx    Liver disease Neg Hx    Breast cancer Neg Hx     Social History   Tobacco Use   Smoking status: Former    Current packs/day: 0.00    Average packs/day: 1 pack/day for 40.3 years (40.3 ttl pk-yrs)    Types: Cigarettes    Start date: 09/04/1972    Quit date: 12/30/2012    Years since quitting: 10.4   Smokeless tobacco: Never   Tobacco comments:    smoking cessation materials not required  Substance Use Topics   Alcohol use: No    Alcohol/week: 0.0 standard drinks of alcohol     Current Outpatient Medications:    ACCU-CHEK FASTCLIX LANCETS MISC, , Disp: , Rfl:    ACCU-CHEK SMARTVIEW test strip, Check fsbs two times daily  DMII, Disp: 100 each, Rfl: 6   Alcohol Swabs (B-D SINGLE USE SWABS REGULAR) PADS, 1 each by Does not apply route 4 (four) times  daily., Disp: 200 each, Rfl: 2   allopurinol (ZYLOPRIM) 100 MG tablet, Take 1 tablet (100 mg total) by mouth daily., Disp: 90 tablet, Rfl: 1   amLODipine (NORVASC) 2.5 MG tablet, Take 1 tablet (2.5 mg total) by mouth daily., Disp: 90 tablet, Rfl: 1   aspirin EC 81 MG tablet, Take 81 mg by mouth every morning. , Disp: , Rfl:    BD INSULIN SYRINGE U/F 31G X 5/16" 0.3 ML MISC, , Disp: , Rfl:    blood glucose meter kit and supplies KIT, Dispense based on patient and insurance preference. Use up to four times daily as directed. (FOR ICD-9 250.00, 250.01)., Disp: 1 each, Rfl: 0   Cholecalciferol (VITAMIN D3) 50 MCG (2000 UT) TABS, Take 2,000 Units by mouth daily., Disp: , Rfl:    cloNIDine (CATAPRES) 0.1 MG tablet, Take 1 tablet (0.1 mg total) by mouth every evening., Disp: 90 tablet, Rfl: 1   Coenzyme Q10 (CO Q-10) 100 MG CAPS, TAKE 1 CAPSULE EVERY DAY, Disp: 90 capsule, Rfl: 3   estradiol (ESTRACE) 0.5 MG tablet, Take 1 tablet (0.5 mg total) by mouth daily., Disp: 90 tablet, Rfl: 1   Ferrous Sulfate (IRON) 325 (65 Fe) MG TABS, Take 325 mg by mouth daily. Nature made brand-, Disp: , Rfl:    fluticasone-salmeterol (ADVAIR) 250-50 MCG/ACT AEPB, Inhale 1 puff into the lungs in the morning and at bedtime., Disp: 180 each, Rfl: 1   Insulin Glargine (BASAGLAR KWIKPEN) 100 UNIT/ML SOPN, Inject 60 Units into the skin at bedtime., Disp: , Rfl:    Insulin Pen Needle (FIFTY50 PEN NEEDLES) 31G X 5 MM MISC, Use once daily, Disp: , Rfl:    ipratropium (ATROVENT) 0.03 % nasal spray, Place 2 sprays into both nostrils every 12 (twelve) hours., Disp: 90 mL, Rfl: 1   isosorbide mononitrate (IMDUR) 60 MG 24 hr tablet, Take 1 tablet (60 mg total) by mouth daily., Disp: 90 tablet, Rfl: 1   loratadine (CLARITIN) 10 MG tablet, Take 1 tablet (10 mg total) by mouth daily., Disp: 90 tablet, Rfl: 1   losartan (COZAAR) 50 MG tablet, Take 1 tablet (50 mg total) by mouth daily., Disp: 90 tablet, Rfl: 1   metoCLOPramide (REGLAN) 5 MG  tablet, Take 1 tablet (5 mg total) by mouth 2 (two) times daily., Disp: 180 tablet, Rfl: 1   metoprolol succinate (TOPROL-XL) 50 MG 24 hr tablet, Take  1 tablet (50 mg total) by mouth daily. Take with or immediately following a meal., Disp: 90 tablet, Rfl: 1   montelukast (SINGULAIR) 10 MG tablet, Take 1 tablet (10 mg total) by mouth every evening., Disp: 90 tablet, Rfl: 1   pantoprazole (PROTONIX) 40 MG tablet, Take 1 tablet (40 mg total) by mouth every morning., Disp: 90 tablet, Rfl: 1   rosuvastatin (CRESTOR) 20 MG tablet, Take 1 tablet (20 mg total) by mouth daily., Disp: 90 tablet, Rfl: 1   SYNJARDY XR 12.01-999 MG TB24, Take 2 tablets by mouth daily., Disp: , Rfl:    TRULICITY 0.75 MG/0.5ML SOPN, , Disp: , Rfl:    venlafaxine XR (EFFEXOR-XR) 75 MG 24 hr capsule, Take 1 capsule (75 mg total) by mouth daily., Disp: 90 capsule, Rfl: 1  Allergies  Allergen Reactions   Codeine Other (See Comments)    Unknown reaction   Contrast Media [Iodinated Contrast Media] Itching   Lisinopril Cough   Sulfa Antibiotics Itching    I personally reviewed active problem list, medication list, allergies, family history, social history, health maintenance with the patient/caregiver today.   ROS  Constitutional: Negative for fever or weight change.  Respiratory: Negative for cough and shortness of breath.   Cardiovascular: Negative for chest pain or palpitations.  Gastrointestinal: Negative for abdominal pain, no bowel changes.  Musculoskeletal: Negative for gait problem or joint swelling.  Skin: Negative for rash.  Neurological: Negative for dizziness or headache.  No other specific complaints in a complete review of systems (except as listed in HPI above).   Objective  Vitals:   05/23/23 0824  BP: 116/70  Pulse: 92  Resp: 16  Temp: 97.9 F (36.6 C)  TempSrc: Oral  SpO2: 92%  Weight: 214 lb 4.8 oz (97.2 kg)  Height: 5' 5.5" (1.664 m)    Body mass index is 35.12 kg/m.  Physical  Exam  Constitutional: Patient appears well-developed and well-nourished. Obese  No distress.  HEENT: head atraumatic, normocephalic, pupils equal and reactive to light, neck supple Cardiovascular: Normal rate, regular rhythm and normal heart sounds.  No murmur heard. No BLE edema. Pulmonary/Chest: Effort normal and breath sounds normal. No respiratory distress. Abdominal: Soft.  There is no tenderness. Psychiatric: Patient has a normal mood and affect. behavior is normal. Judgment and thought content normal.    PHQ2/9:    05/23/2023    8:26 AM 11/20/2022   10:55 AM 06/26/2022    3:28 PM 06/09/2022   11:05 AM 05/22/2022   10:49 AM  Depression screen PHQ 2/9  Decreased Interest 0 0 0 0 0  Down, Depressed, Hopeless 0 0 0 0 0  PHQ - 2 Score 0 0 0 0 0  Altered sleeping 0 0 0 0 0  Tired, decreased energy 0 0 0 0 0  Change in appetite 0 0 0 0 0  Feeling bad or failure about yourself  0 0 0 0 0  Trouble concentrating 0 0 0 0 0  Moving slowly or fidgety/restless 0 0 0 0 0  Suicidal thoughts 0 0 0 0 0  PHQ-9 Score 0 0 0 0 0  Difficult doing work/chores   Not difficult at all      phq 9 is negative   Fall Risk:    05/23/2023    8:26 AM 11/20/2022   10:55 AM 06/26/2022    3:28 PM 06/09/2022   11:05 AM 05/22/2022   10:49 AM  Fall Risk   Falls in the past year?  0 0 0 0 0  Number falls in past yr:   0 0 0  Injury with Fall?   0 0 0  Risk for fall due to : No Fall Risks No Fall Risks  No Fall Risks No Fall Risks  Follow up Falls prevention discussed Falls prevention discussed;Education provided;Falls evaluation completed  Falls prevention discussed Falls prevention discussed      Functional Status Survey: Is the patient deaf or have difficulty hearing?: No Does the patient have difficulty seeing, even when wearing glasses/contacts?: No Does the patient have difficulty concentrating, remembering, or making decisions?: No Does the patient have difficulty walking or climbing stairs?:  No Does the patient have difficulty dressing or bathing?: No Does the patient have difficulty doing errands alone such as visiting a doctor's office or shopping?: No    Assessment & Plan  1. Angina pectoris (HCC)  - rosuvastatin (CRESTOR) 20 MG tablet; Take 1 tablet (20 mg total) by mouth daily.  Dispense: 90 tablet; Refill: 1 - isosorbide mononitrate (IMDUR) 60 MG 24 hr tablet; Take 1 tablet (60 mg total) by mouth daily.  Dispense: 90 tablet; Refill: 1 - Ambulatory referral to Cardiology  2. Centrilobular emphysema (HCC)  - fluticasone-salmeterol (ADVAIR) 250-50 MCG/ACT AEPB; Inhale 1 puff into the lungs in the morning and at bedtime.  Dispense: 180 each; Refill: 1  3. Atherosclerosis of abdominal aorta (HCC)  - metoprolol succinate (TOPROL-XL) 50 MG 24 hr tablet; Take 1 tablet (50 mg total) by mouth daily. Take with or immediately following a meal.  Dispense: 90 tablet; Refill: 1 - rosuvastatin (CRESTOR) 20 MG tablet; Take 1 tablet (20 mg total) by mouth daily.  Dispense: 90 tablet; Refill: 1  4. Major depression in remission (HCC)  - venlafaxine XR (EFFEXOR-XR) 75 MG 24 hr capsule; Take 1 capsule (75 mg total) by mouth daily.  Dispense: 90 capsule; Refill: 1  5. Mild pulmonary hypertension (HCC)  Follow up with Dr. Juliann Pares, does not want to use OSA   6. Dyslipidemia associated with type 2 diabetes mellitus (HCC)  Well controlled, continue follow up with Endo   7. Claudication [facility])  On statin therapy   8. Need for immunization against influenza  - Flu Vaccine Trivalent High Dose (Fluad)  9. Gastroesophageal reflux disease without esophagitis  - pantoprazole (PROTONIX) 40 MG tablet; Take 1 tablet (40 mg total) by mouth every morning.  Dispense: 90 tablet; Refill: 1  10. Menopausal vasomotor syndrome  - cloNIDine (CATAPRES) 0.1 MG tablet; Take 1 tablet (0.1 mg total) by mouth every evening.  Dispense: 90 tablet; Refill: 1 - venlafaxine XR (EFFEXOR-XR) 75 MG 24 hr  capsule; Take 1 capsule (75 mg total) by mouth daily.  Dispense: 90 capsule; Refill: 1 - estradiol (ESTRACE) 0.5 MG tablet; Take 1 tablet (0.5 mg total) by mouth daily.  Dispense: 90 tablet; Refill: 1  11. Hypertension, benign  - amLODipine (NORVASC) 2.5 MG tablet; Take 1 tablet (2.5 mg total) by mouth daily.  Dispense: 90 tablet; Refill: 1 - losartan (COZAAR) 50 MG tablet; Take 1 tablet (50 mg total) by mouth daily.  Dispense: 90 tablet; Refill: 1 - metoprolol succinate (TOPROL-XL) 50 MG 24 hr tablet; Take 1 tablet (50 mg total) by mouth daily. Take with or immediately following a meal.  Dispense: 90 tablet; Refill: 1 - Ambulatory referral to Cardiology  12. Perennial allergic rhinitis  - montelukast (SINGULAIR) 10 MG tablet; Take 1 tablet (10 mg total) by mouth every evening.  Dispense: 90 tablet; Refill:  1 - loratadine (CLARITIN) 10 MG tablet; Take 1 tablet (10 mg total) by mouth daily.  Dispense: 90 tablet; Refill: 1  13. Colon cancer screening  - Ambulatory referral to Gastroenterology

## 2023-05-23 ENCOUNTER — Ambulatory Visit (INDEPENDENT_AMBULATORY_CARE_PROVIDER_SITE_OTHER): Payer: Medicare PPO | Admitting: Family Medicine

## 2023-05-23 ENCOUNTER — Encounter: Payer: Self-pay | Admitting: Family Medicine

## 2023-05-23 VITALS — BP 116/70 | HR 92 | Temp 97.9°F | Resp 16 | Ht 65.5 in | Wt 214.3 lb

## 2023-05-23 DIAGNOSIS — F325 Major depressive disorder, single episode, in full remission: Secondary | ICD-10-CM | POA: Diagnosis not present

## 2023-05-23 DIAGNOSIS — J432 Centrilobular emphysema: Secondary | ICD-10-CM | POA: Diagnosis not present

## 2023-05-23 DIAGNOSIS — I700 Atherosclerosis of aorta: Secondary | ICD-10-CM | POA: Diagnosis not present

## 2023-05-23 DIAGNOSIS — E1169 Type 2 diabetes mellitus with other specified complication: Secondary | ICD-10-CM

## 2023-05-23 DIAGNOSIS — I10 Essential (primary) hypertension: Secondary | ICD-10-CM

## 2023-05-23 DIAGNOSIS — I739 Peripheral vascular disease, unspecified: Secondary | ICD-10-CM | POA: Diagnosis not present

## 2023-05-23 DIAGNOSIS — K219 Gastro-esophageal reflux disease without esophagitis: Secondary | ICD-10-CM

## 2023-05-23 DIAGNOSIS — I272 Pulmonary hypertension, unspecified: Secondary | ICD-10-CM | POA: Diagnosis not present

## 2023-05-23 DIAGNOSIS — Z23 Encounter for immunization: Secondary | ICD-10-CM | POA: Diagnosis not present

## 2023-05-23 DIAGNOSIS — I209 Angina pectoris, unspecified: Secondary | ICD-10-CM

## 2023-05-23 DIAGNOSIS — J3089 Other allergic rhinitis: Secondary | ICD-10-CM

## 2023-05-23 DIAGNOSIS — N951 Menopausal and female climacteric states: Secondary | ICD-10-CM

## 2023-05-23 DIAGNOSIS — Z1211 Encounter for screening for malignant neoplasm of colon: Secondary | ICD-10-CM

## 2023-05-23 MED ORDER — FLUTICASONE-SALMETEROL 250-50 MCG/ACT IN AEPB
1.0000 | INHALATION_SPRAY | Freq: Two times a day (BID) | RESPIRATORY_TRACT | 1 refills | Status: DC
Start: 2023-05-23 — End: 2023-11-20

## 2023-05-23 MED ORDER — AMLODIPINE BESYLATE 2.5 MG PO TABS
2.5000 mg | ORAL_TABLET | Freq: Every day | ORAL | 1 refills | Status: DC
Start: 2023-05-23 — End: 2023-11-20

## 2023-05-23 MED ORDER — ISOSORBIDE MONONITRATE ER 60 MG PO TB24
60.0000 mg | ORAL_TABLET | Freq: Every day | ORAL | 1 refills | Status: DC
Start: 2023-05-23 — End: 2023-11-20

## 2023-05-23 MED ORDER — ROSUVASTATIN CALCIUM 20 MG PO TABS
20.0000 mg | ORAL_TABLET | Freq: Every day | ORAL | 1 refills | Status: DC
Start: 2023-05-23 — End: 2023-10-31

## 2023-05-23 MED ORDER — ESTRADIOL 0.5 MG PO TABS
0.5000 mg | ORAL_TABLET | Freq: Every day | ORAL | 1 refills | Status: DC
Start: 2023-05-23 — End: 2023-11-20

## 2023-05-23 MED ORDER — METOPROLOL SUCCINATE ER 50 MG PO TB24
50.0000 mg | ORAL_TABLET | Freq: Every day | ORAL | 1 refills | Status: DC
Start: 2023-05-23 — End: 2023-11-20

## 2023-05-23 MED ORDER — ALLOPURINOL 100 MG PO TABS: 100.0000 mg | ORAL_TABLET | Freq: Every day | ORAL | 1 refills | Status: DC

## 2023-05-23 MED ORDER — PANTOPRAZOLE SODIUM 40 MG PO TBEC
40.0000 mg | DELAYED_RELEASE_TABLET | Freq: Every morning | ORAL | 1 refills | Status: DC
Start: 2023-05-23 — End: 2023-11-20

## 2023-05-23 MED ORDER — LORATADINE 10 MG PO TABS
10.0000 mg | ORAL_TABLET | Freq: Every day | ORAL | 1 refills | Status: DC
Start: 2023-05-23 — End: 2023-11-20

## 2023-05-23 MED ORDER — LOSARTAN POTASSIUM 50 MG PO TABS
50.0000 mg | ORAL_TABLET | Freq: Every day | ORAL | 1 refills | Status: DC
Start: 2023-05-23 — End: 2023-11-20

## 2023-05-23 MED ORDER — MONTELUKAST SODIUM 10 MG PO TABS
10.0000 mg | ORAL_TABLET | Freq: Every evening | ORAL | 1 refills | Status: DC
Start: 2023-05-23 — End: 2023-11-20

## 2023-05-23 MED ORDER — CLONIDINE HCL 0.1 MG PO TABS
0.1000 mg | ORAL_TABLET | Freq: Every evening | ORAL | 1 refills | Status: DC
Start: 2023-05-23 — End: 2023-11-20

## 2023-05-23 MED ORDER — VENLAFAXINE HCL ER 75 MG PO CP24
75.0000 mg | ORAL_CAPSULE | Freq: Every day | ORAL | 1 refills | Status: DC
Start: 2023-05-23 — End: 2023-11-20

## 2023-05-31 ENCOUNTER — Ambulatory Visit: Payer: Medicare PPO

## 2023-05-31 VITALS — Ht 65.5 in | Wt 214.0 lb

## 2023-05-31 DIAGNOSIS — Z0000 Encounter for general adult medical examination without abnormal findings: Secondary | ICD-10-CM

## 2023-05-31 DIAGNOSIS — Z1211 Encounter for screening for malignant neoplasm of colon: Secondary | ICD-10-CM

## 2023-05-31 NOTE — Patient Instructions (Signed)
Ms. Schluter , Thank you for taking time to come for your Medicare Wellness Visit. I appreciate your ongoing commitment to your health goals. Please review the following plan we discussed and let me know if I can assist you in the future.   Referrals/Orders/Follow-Ups/Clinician Recommendations:   You have an order for:  []   2D Mammogram  []   3D Mammogram  [x]   Bone Density     Please call for appointment:  [facility]  [address] [place] [address] [phone]  [facility] [address] [place], [place] [address] [phone]  [facility] [address] [place], [place] [address] [phone]    Make sure to wear two-piece clothing.  No lotions, powders, or deodorants the day of the appointment. Make sure to bring picture ID and insurance card.  Bring list of medications you are currently taking including any supplements.   Schedule your [place] screening mammogram through MyChart!   Log into your MyChart account.  Go to 'Visit' (or 'Appointments' if on mobile App) --> Schedule an Appointment  Under 'Select a Reason for Visit' choose the Mammogram Screening option.  Complete the pre-visit questions and select the time and place that best fits your schedule.    This is a list of the screening recommended for you and due dates:  Health Maintenance  Topic Date Due   Screening for Lung Cancer  04/19/2021   Yearly kidney function blood test for diabetes  06/23/2023   Yearly kidney health urinalysis for diabetes  06/23/2023   Colon Cancer Screening  09/03/2023   COVID-19 Vaccine (5 - 2023-24 season) 06/08/2023*   Zoster (Shingles) Vaccine (1 of 2) 08/21/2023*   Complete foot exam   06/27/2023   Hemoglobin A1C  06/27/2023   Eye exam for diabetics  01/04/2024   Mammogram  04/30/2024   Medicare Annual Wellness Visit  05/30/2024    DTaP/Tdap/Td vaccine (3 - Td or Tdap) 03/10/2030   Pneumonia Vaccine  Completed   Flu Shot  Completed   DEXA scan (bone density measurement)  Completed   Hepatitis C Screening  Completed   HPV Vaccine  Aged Out  *Topic was postponed. The date shown is not the original due date.    Advanced directives: (Copy Requested) Please bring a copy of your health care power of attorney and living will to the office to be added to your chart at your convenience.  Next Medicare Annual Wellness Visit scheduled for next year: Yes  06/05/2024 @ 11:00am telephone

## 2023-05-31 NOTE — Progress Notes (Signed)
Subjective:   Dana Sparks is a 72 y.o. female who presents for Medicare Annual (Subsequent) preventive examination.  Visit Complete: Virtual  I connected with  Clyde Lundborg on 05/31/23 by a audio enabled telemedicine application and verified that I am speaking with the correct person using two identifiers.  Patient Location: Home  Provider Location: Home Office  I discussed the limitations of evaluation and management by telemedicine. The patient expressed understanding and agreed to proceed.  Because this visit was a virtual/telehealth visit, some criteria may be missing or patient reported. Any vitals not documented were not able to be obtained and vitals that have been documented are patient reported.   Patient Medicare AWV questionnaire was completed by the patient on (not done); I have confirmed that all information answered by patient is correct and no changes since this date. Cardiac Risk Factors include: advanced age (>38men, >19 women);diabetes mellitus;dyslipidemia;sedentary lifestyle;obesity (BMI >30kg/m2);hypertension    Objective:    Today's Vitals   05/31/23 1151  Weight: 214 lb (97.1 kg)  Height: 5' 5.5" (1.664 m)   Body mass index is 35.07 kg/m.     05/31/2023   11:58 AM 12/22/2021    8:52 AM 12/14/2020    8:42 AM 12/11/2019    9:13 AM 03/24/2019    8:09 AM 09/02/2018    7:44 AM 08/16/2018    7:49 AM  Advanced Directives  Does Patient Have a Medical Advance Directive? Yes No No No No No No  Type of Estate agent of [place];Living will        Would patient like information on creating a medical advance directive?  No - Patient declined Yes (MAU/Ambulatory/Procedural Areas - Information given) Yes (MAU/Ambulatory/Procedural Areas - Information given) No - Patient declined No - Patient declined No - Patient declined    Current Medications (verified) Outpatient Encounter Medications as of 05/31/2023  Medication Sig   ACCU-CHEK FASTCLIX  LANCETS MISC    ACCU-CHEK SMARTVIEW test strip Check fsbs two times daily  DMII   Alcohol Swabs (B-D SINGLE USE SWABS REGULAR) PADS 1 each by Does not apply route 4 (four) times daily.   allopurinol (ZYLOPRIM) 100 MG tablet Take 1 tablet (100 mg total) by mouth daily.   amLODipine (NORVASC) 2.5 MG tablet Take 1 tablet (2.5 mg total) by mouth daily.   aspirin EC 81 MG tablet Take 81 mg by mouth every morning.    BD INSULIN SYRINGE U/F 31G X 5/16" 0.3 ML MISC    blood glucose meter kit and supplies KIT Dispense based on patient and insurance preference. Use up to four times daily as directed. (FOR ICD-9 250.00, 250.01).   Cholecalciferol (VITAMIN D3) 50 MCG (2000 UT) TABS Take 2,000 Units by mouth daily.   cloNIDine (CATAPRES) 0.1 MG tablet Take 1 tablet (0.1 mg total) by mouth every evening.   Coenzyme Q10 (CO Q-10) 100 MG CAPS TAKE 1 CAPSULE EVERY DAY   Dulaglutide (TRULICITY) 1.5 MG/0.5ML SOPN Inject 1.5 mg into the skin once a week.   estradiol (ESTRACE) 0.5 MG tablet Take 1 tablet (0.5 mg total) by mouth daily.   Ferrous Sulfate (IRON) 325 (65 Fe) MG TABS Take 325 mg by mouth daily. Nature made brand-   fluticasone-salmeterol (ADVAIR) 250-50 MCG/ACT AEPB Inhale 1 puff into the lungs in the morning and at bedtime.   Insulin Glargine (BASAGLAR KWIKPEN) 100 UNIT/ML SOPN Inject 60 Units into the skin at bedtime.   Insulin Pen Needle (FIFTY50 PEN NEEDLES) 31G  X 5 MM MISC Use once daily   ipratropium (ATROVENT) 0.03 % nasal spray Place 2 sprays into both nostrils every 12 (twelve) hours.   isosorbide mononitrate (IMDUR) 60 MG 24 hr tablet Take 1 tablet (60 mg total) by mouth daily.   loratadine (CLARITIN) 10 MG tablet Take 1 tablet (10 mg total) by mouth daily.   losartan (COZAAR) 50 MG tablet Take 1 tablet (50 mg total) by mouth daily.   metoCLOPramide (REGLAN) 5 MG tablet Take 1 tablet (5 mg total) by mouth 2 (two) times daily.   metoprolol succinate (TOPROL-XL) 50 MG 24 hr tablet Take 1 tablet  (50 mg total) by mouth daily. Take with or immediately following a meal.   montelukast (SINGULAIR) 10 MG tablet Take 1 tablet (10 mg total) by mouth every evening.   pantoprazole (PROTONIX) 40 MG tablet Take 1 tablet (40 mg total) by mouth every morning.   rosuvastatin (CRESTOR) 20 MG tablet Take 1 tablet (20 mg total) by mouth daily.   SYNJARDY XR 12.01-999 MG TB24 Take 2 tablets by mouth daily.   venlafaxine XR (EFFEXOR-XR) 75 MG 24 hr capsule Take 1 capsule (75 mg total) by mouth daily.   No facility-administered encounter medications on file as of 05/31/2023.    Allergies (verified) Codeine, Contrast media [iodinated contrast media], Lisinopril, and Sulfa antibiotics   History: Past Medical History:  Diagnosis Date   Allergy    Anxiety    Arthritis    right knee   CAD (coronary artery disease)    COPD (chronic obstructive pulmonary disease) (HCC)    Depression    Diabetes mellitus without complication (HCC)    type 2   Diverticulitis    Gastroparesis    GERD (gastroesophageal reflux disease)    Gout    Hyperlipemia    Hypertension    Positive H. pylori test    Renal insufficiency    Sleep apnea    CPAP   Thrombocytosis 01/28/2015   Tubular adenoma of colon 01/20/14   Past Surgical History:  Procedure Laterality Date   ABDOMINAL HYSTERECTOMY     total   BREAST BIOPSY Right 06/07/2022   stereo bx, calcs, COIL clip-FIBROADENOMATOID CHANGE   CATARACT EXTRACTION     CATARACT EXTRACTION W/PHACO Left 03/24/2019   Procedure: CATARACT EXTRACTION PHACO AND INTRAOCULAR LENS PLACEMENT (IOC)  LEFT DIABETIC;  Surgeon: Nevada Crane, MD;  Location[facility];  Service: Ophthalmology;  Laterality: Left;  Diabetic - insulin and oral meds sleep apnea   COLONOSCOPY  01/02/2014   sigmoid diverticulosis. desc colon TA, hyperplastic polyp   COLONOSCOPY WITH PROPOFOL N/A 09/02/2018   Procedure: COLONOSCOPY WITH PROPOFOL;  Surgeon: Midge Minium, MD;  Location: [facility];  Service: Endoscopy;  Laterality: N/A;   CORONARY ANGIOPLASTY WITH STENT PLACEMENT     ESOPHAGOGASTRODUODENOSCOPY N/A 02/16/2015   negative h pylori, focal gastic intestinal metaplasia   ESOPHAGOGASTRODUODENOSCOPY (EGD) WITH PROPOFOL N/A 09/02/2018   Procedure: ESOPHAGOGASTRODUODENOSCOPY (EGD) WITH BIOPSIES;  Surgeon: Midge Minium, MD;  Location: [facility];  Service: Endoscopy;  Laterality: N/A;  diabetic -insulin and oral meds sleep apnea   TOTAL KNEE ARTHROPLASTY Right    Family History  Problem Relation Age of Onset   Stroke Sister    Hyperlipidemia Sister    Alcohol abuse Brother    Diabetes Brother    Stroke Brother    Hypertension Mother    Diabetes Mother    Heart disease Mother    Diabetes Father  Heart disease Father    Hypertension Father    Hypertension Sister    Cancer Maternal Grandmother        Unsure   Diabetes Maternal Grandfather    Colon cancer Neg Hx    Liver disease Neg Hx    Breast cancer Neg Hx    Social History   Socioeconomic History   Marital status: Married    Spouse name: Peyton Najjar   Number of children: 2   Years of education: some college   Highest education level: 12th grade  Occupational History   Occupation: Disabled  Tobacco Use   Smoking status: Former    Current packs/day: 0.00    Average packs/day: 1 pack/day for 40.3 years (40.3 ttl pk-yrs)    Types: Cigarettes    Start date: 09/04/1972    Quit date: 12/30/2012    Years since quitting: 10.4   Smokeless tobacco: Never   Tobacco comments:    smoking cessation materials not required  Vaping Use   Vaping status: Never Used  Substance and Sexual Activity   Alcohol use: No    Alcohol/week: 0.0 standard drinks of alcohol   Drug use: No   Sexual activity: Not Currently  Other Topics Concern   Not on file  Social History Narrative   Married - current 3rd,    Two children from first marriage   He has 4 children from previous marriage   Social Determinants of  Health   Financial Resource Strain: Low Risk  (05/31/2023)   Overall Financial Resource Strain (CARDIA)    Difficulty of Paying Living Expenses: Not hard at all  Food Insecurity: No Food Insecurity (05/31/2023)   Hunger Vital Sign    Worried About Running Out of Food in the Last Year: Never true    Ran Out of Food in the Last Year: Never true  Transportation Needs: No Transportation Needs (05/31/2023)   PRAPARE - Administrator, Civil Service (Medical): No    Lack of Transportation (Non-Medical): No  Physical Activity: Inactive (05/31/2023)   Exercise Vital Sign    Days of Exercise per Week: 0 days    Minutes of Exercise per Session: 0 min  Stress: No Stress Concern Present (05/31/2023)   [employer] of Occupational Health - Occupational Stress Questionnaire    Feeling of Stress : Not at all  Social Connections: Socially Integrated (05/31/2023)   Social Connection and Isolation Panel [NHANES]    Frequency of Communication with Friends and Family: More than three times a week    Frequency of Social Gatherings with Friends and Family: More than three times a week    Attends Religious Services: More than 4 times per year    Active Member of [employer] or Organizations: Yes    Attends Engineer, structural: More than 4 times per year    Marital Status: Married    Tobacco Counseling Counseling given: Not Answered Tobacco comments: smoking cessation materials not required   Clinical Intake:  Pre-visit preparation completed: Yes  Pain : No/denies pain    BMI - recorded: 35.07 Nutritional Status: BMI > 30  Obese Nutritional Risks: None Diabetes: Yes CBG done?: No CBG resulted in Enter/ Edit results?: No Did pt. bring in CBG monitor from home?: No  How often do you need to have someone help you when you read instructions, pamphlets, or other written materials from your doctor or pharmacy?: 1 - Never  Interpreter Needed?: No  Comments: lives with  husband Information  entered by :: B.Clay Solum,LPN   Activities of Daily Living    05/31/2023   11:58 AM 05/23/2023    8:26 AM  In your present state of health, do you have any difficulty performing the following activities:  Hearing? 0 0  Vision? 0 0  Difficulty concentrating or making decisions? 0 0  Walking or climbing stairs? 0 0  Dressing or bathing? 0 0  Doing errands, shopping? 0 0  Preparing Food and eating ? N   Using the Toilet? N   In the past six months, have you accidently leaked urine? N   Do you have problems with loss of bowel control? N   Managing your Medications? N   Managing your Finances? N   Housekeeping or managing your Housekeeping? N     Patient Care Team: Alba Cory, MD as PCP - General (Family Medicine) Midge Minium, MD as Consulting Physician (Gastroenterology) Alwyn Pea, MD as Consulting Physician (Cardiology) Tedd Sias Marlana Salvage, MD as Physician Assistant (Endocrinology) Kennedy Bucker, MD as Consulting Physician (Orthopedic Surgery) Rosey Bath, MD (Inactive) as Referring Physician (Hematology and Oncology) Gaspar Cola, RPH (Inactive) (Pharmacist) Linus Galas, DPM (Podiatry)  Indicate any recent Medical Services you may have received from other than [facility] providers in the past year (date may be approximate).     Assessment:   This is a routine wellness examination for [facility].  Hearing/Vision screen Hearing Screening - Comments:: Pt says Hearing is good Vision Screening - Comments:: Pt say vision is good   Goals Addressed               This Visit's Progress     COMPLETED: Diabetes Mellitus - goal A1c < 7% (pt-stated)   On track     CARE PLAN ENTRY (see longitudinal plan of care for additional care plan information)  Current Barriers:  Diabetes: type 2; complicated by chronic medical conditions including COPD, HLD Per Endocrinology, A1c = 6.8%, now controlled Current antihyperglycemic regimen: Trulicity,Ozempic,  Synjardy Reports hypoglycemic symptoms 2-3 weekly, including dizziness, lightheadedness, shaking, sweating Denies hyperglycemic symptoms, including polyuria, polydipsia, polyphagia, nocturia, blurred vision, neuropathy Current exercise: walking Current blood glucose readings: typically in the 100 range Cardiovascular risk reduction: Current hypertensive regimen: metoprolol, lisinopril Current hyperlipidemia regimen: Crestor Current antiplatelet regimen: ASA 81mg   Pharmacist Clinical Goal(s):  Over the next 90 days, patient will work with PharmD and primary care provider to address frequent hypoglycemia  Interventions: Comprehensive medication review performed, medication list updated in electronic medical record Inter-disciplinary care team collaboration (see longitudinal plan of care) Once patient checks FSBG at various times daily, associate meals and snacks with those times  Patient Self Care Activities:  Patient will check blood glucose twice daily for 2 weeks, document, and provide at future appointments PPatient will take medications as prescribed Patient will contact provider with any episodes of hypoglycemia Patient will report any questions or concerns to provider   Initial goal documentation       COMPLETED: DIET - INCREASE WATER INTAKE   On track     Recommend to drink at least 6-8 8oz glasses of water per day.      COMPLETED: Hyperlipidemia - goal LDL < 70   On track     CARE PLAN ENTRY (see longitudinal plan of care for additional care plan information)  Current Barriers:  Controlled hyperlipidemia, complicated by diabetes, hypertension Current antihyperlipidemic regimen: Crestor Previous antihyperlipidemic medications tried NA Most recent lipid panel:     Component Value Date/Time  CHOL 138 03/17/2019 0854   CHOL 143 06/28/2015 1017   TRIG 100 03/17/2019 0854   HDL 53 03/17/2019 0854   HDL 53 06/28/2015 1017   CHOLHDL 2.6 03/17/2019 0854   VLDL 16  02/27/2017 0902   LDLCALC 66 03/17/2019 0854   ASCVD risk enhancing conditions: age >22, DM, HTN  Pharmacist Clinical Goal(s):  Over the next 90 days, patient will work with PharmD and providers towards improving diet for optimized antihyperlipidemic therapy  Interventions: Comprehensive medication review performed; medication list updated in electronic medical record.  Inter-disciplinary care team collaboration (see longitudinal plan of care) Discussed more routine diet  Patient Self Care Activities:  Patient will focus on medication adherence by continuing current pattern and supporting with more regularly timed diet  Initial goal documentation        Depression Screen    05/31/2023   11:56 AM 05/23/2023    8:26 AM 11/20/2022   10:55 AM 06/26/2022    3:28 PM 06/09/2022   11:05 AM 05/22/2022   10:49 AM 02/10/2022    1:01 PM  PHQ 2/9 Scores  PHQ - 2 Score 0 0 0 0 0 0 0  PHQ- 9 Score  0 0 0 0 0 0    Fall Risk    05/31/2023   11:54 AM 05/23/2023    8:26 AM 11/20/2022   10:55 AM 06/26/2022    3:28 PM 06/09/2022   11:05 AM  Fall Risk   Falls in the past year? 0 0 0 0 0  Number falls in past yr: 0   0 0  Injury with Fall? 0   0 0  Risk for fall due to : Other (Comment) No Fall Risks No Fall Risks  No Fall Risks  Follow up Education provided;Falls prevention discussed Falls prevention discussed Falls prevention discussed;Education provided;Falls evaluation completed  Falls prevention discussed    MEDICARE RISK AT HOME: Medicare Risk at Home Any stairs in or around the home?: Yes If so, are there any without handrails?: Yes Home free of loose throw rugs in walkways, pet beds, electrical cords, etc?: Yes Adequate lighting in your home to reduce risk of falls?: Yes Life alert?: No Use of a cane, walker or w/c?: No Grab bars in the bathroom?: No Shower chair or bench in shower?: No Elevated toilet seat or a handicapped toilet?: Yes  TIMED UP AND GO:  Was the test  performed?  No    Cognitive Function:    02/06/2019    9:16 AM  MMSE - Mini Mental State Exam  Orientation to time 5  Orientation to Place 5  Registration 3  Attention/ Calculation 5  Recall 3  Language- name 2 objects 2  Language- repeat 1  Language- follow 3 step command 3  Language- read & follow direction 1  Write a sentence 1  Copy design 1  Total score 30        05/31/2023   12:02 PM 12/11/2019    9:17 AM 01/15/2019    9:56 AM 01/04/2018   10:52 AM  6CIT Screen  What Year? 0 points 0 points 0 points 0 points  What month? 0 points 0 points 0 points 0 points  What time? 0 points 0 points 0 points 0 points  Count back from 20 0 points 0 points 0 points 0 points  Months in reverse 4 points 0 points 4 points 0 points  Repeat phrase 0 points 4 points 4 points 4 points  Total Score  4 points 4 points 8 points 4 points    Immunizations Immunization History  Administered Date(s) Administered   Fluad Quad(high Dose 65+) 05/06/2019, 06/16/2020, 05/22/2022   Fluad Trivalent(High Dose 65+) 05/23/2023   Influenza, High Dose Seasonal PF 05/23/2017, 06/04/2018, 06/29/2021   Influenza,inj,Quad PF,6+ Mos 06/28/2015, 06/13/2016   Influenza-Unspecified 06/28/2015, 06/13/2016   Moderna Covid-19 Vaccine Bivalent Booster 73yrs & up 06/16/2021   Moderna SARS-COV2 Booster Vaccination 12/09/2020   Moderna Sars-Covid-2 Vaccination 09/29/2019, 10/27/2019, 06/29/2020   Pneumococcal Conjugate-13 07/21/2014   Pneumococcal Polysaccharide-23 06/02/2009, 11/10/2016   Tdap 12/30/2009, 03/10/2020   Zoster, Live 07/18/2012    TDAP status: Up to date  Flu Vaccine status: Up to date  Pneumococcal vaccine status: Up to date  Covid-19 vaccine status: Completed vaccines  Qualifies for Shingles Vaccine? Yes   Zostavax completed Yes   Shingrix Completed?: Yes  Screening Tests Health Maintenance  Topic Date Due   Lung Cancer Screening  04/19/2021   Diabetic kidney evaluation - eGFR measurement   06/23/2023   Diabetic kidney evaluation - Urine ACR  06/23/2023   Colonoscopy  09/03/2023   COVID-19 Vaccine (5 - 2023-24 season) 06/08/2023 (Originally 05/06/2023)   Zoster Vaccines- Shingrix (1 of 2) 08/21/2023 (Originally 07/24/1970)   FOOT EXAM  06/27/2023   HEMOGLOBIN A1C  06/27/2023   OPHTHALMOLOGY EXAM  01/04/2024   MAMMOGRAM  04/30/2024   Medicare Annual Wellness (AWV)  05/30/2024   DTaP/Tdap/Td (3 - Td or Tdap) 03/10/2030   Pneumonia Vaccine 77+ Years old  Completed   INFLUENZA VACCINE  Completed   DEXA SCAN  Completed   Hepatitis C Screening  Completed   HPV VACCINES  Aged Out    Health Maintenance  Health Maintenance Due  Topic Date Due   Lung Cancer Screening  04/19/2021   Diabetic kidney evaluation - eGFR measurement  06/23/2023   Diabetic kidney evaluation - Urine ACR  06/23/2023   Colonoscopy  09/03/2023    Colorectal cancer screening: Type of screening: Colonoscopy. Completed yes. Repeat every 5-10 years  Mammogram status: Completed yes. Repeat every year  Bone Density status: Completed yes. Results reflect: Bone density results: NORMAL. Repeat every 5 years.  Lung Cancer Screening: (Low Dose CT Chest recommended if Age 30-80 years, 20 pack-year currently smoking OR have quit w/in 15years.) does not qualify.   Lung Cancer Screening Referral: no  Additional Screening:  Hepatitis C Screening: does not qualify; Completed yes  Vision Screening: Recommended annual ophthalmology exams for early detection of glaucoma and other disorders of the eye. Is the patient up to date with their annual eye exam?  Yes  Who is the provider or what is the name of the office in which the patient attends annual eye exams? [facility] If pt is not established with a provider, would they like to be referred to a provider to establish care? No .   Dental Screening: Recommended annual dental exams for proper oral hygiene  Diabetic Foot Exam: Diabetic Foot Exam: Completed  yes  Community Resource Referral / Chronic Care Management: CRR required this visit?  No   CCM required this visit?  No    Plan:     I have personally reviewed and noted the following in the patient's chart:   Medical and social history Use of alcohol, tobacco or illicit drugs  Current medications and supplements including opioid prescriptions. Patient is not currently taking opioid prescriptions. Functional ability and status Nutritional status Physical activity Advanced directives List of other physicians Hospitalizations, surgeries, and  ER visits in previous 12 months Vitals Screenings to include cognitive, depression, and falls Referrals and appointments  In addition, I have reviewed and discussed with patient certain preventive protocols, quality metrics, and best practice recommendations. A written personalized care plan for preventive services as well as general preventive health recommendations were provided to patient.    Sue Lush, LPN   [id number]   After Visit Summary: (MyChart) Due to this being a telephonic visit, the after visit summary with patients personalized plan was offered to patient via MyChart   Nurse Notes: The patient states she is doing well and has no concerns or questions at this time.

## 2023-06-04 ENCOUNTER — Ambulatory Visit: Payer: Self-pay | Admitting: *Deleted

## 2023-06-04 NOTE — Telephone Encounter (Signed)
Summary:  Per agent:  "Personal discomfort / rx req   The patient has experienced personal discomfort for roughly four days  The patient shares that they have noticed discharge, itching and skin irritation  The patient would like to be prescribed something for their symptoms  Please contact further when possible"     Chief Complaint: Vagina Discharge, itching Symptoms: Vaginal itching, redness, irritation, milky like discharge. Frequency: 4 days Pertinent Negatives: Patient denies has not been on ATBs. No odor,abdominal pain Disposition: [] ED /[] Urgent Care (no appt availability in office) / [] Appointment(In office/virtual)/ []  [facility]/ [] Home Care/ [] Refused Recommended Disposition /[] [facility]/ [x]  Follow-up with PCP Additional Notes: Pt states H/O "And Dr. Carlynn Purl will call in the pill for me." Assured pt NT will route to practice for PCPs review and final disposition. Care advise provided, pt verbalizes understanding.  If appropriate, please call in to [facility] on [address].   Advised may require appt..  Please advise.  Reason for Disposition  [1] Symptoms of a "yeast infection" (i.e., itchy, white discharge, not bad smelling) AND [2] not improved > 3 days following Care Advice  Answer Assessment - Initial Assessment Questions 1. DISCHARGE: "Describe the discharge." (e.g., white, yellow, green, gray, foamy, cottage cheese-like)     Milky like 2. ODOR: "Is there a bad odor?"     No 3. ONSET: "When did the discharge begin?"     4 days ago 4. RASH: "Is there a rash in the genital area?" If Yes, ask: "Describe it." (e.g., redness, blisters, sores, bumps)     Redness, raw 5. ABDOMEN PAIN: "Are you having any abdomen pain?" If Yes, ask: "What does it feel like? " (e.g., crampy, dull, intermittent, constant)      no 6. ABDOMEN PAIN SEVERITY: If present, ask: "How bad is it?" (e.g., Scale 1-10; mild, moderate, or severe)   - MILD (1-3): Doesn't interfere  with normal activities, abdomen soft and not tender to touch.    - MODERATE (4-7): Interferes with normal activities or awakens from sleep, abdomen tender to touch.    - SEVERE (8-10): Excruciating pain, doubled over, unable to do any normal activities. (R/O peritonitis)     Na 7. CAUSE: "What do you think is causing the discharge?" "Have you had the same problem before? What happened then?"     Yeast infection 8. OTHER SYMPTOMS: "Do you have any other symptoms?" (e.g., fever, itching, vaginal bleeding, pain with urination, injury to genital area, vaginal foreign body)     First starting to urinatng burns bad.  Protocols used: Vaginal Discharge-A-AH

## 2023-06-04 NOTE — Telephone Encounter (Signed)
Called patient to give her an appt with Caralee Ates at 11:20 Jun 05, 2023 but she said that she could not come for they only have one care and she has an appt at 8 and husband goes to work at 75.She said that she would be ok.

## 2023-06-05 DIAGNOSIS — E782 Mixed hyperlipidemia: Secondary | ICD-10-CM | POA: Diagnosis not present

## 2023-06-05 DIAGNOSIS — E1159 Type 2 diabetes mellitus with other circulatory complications: Secondary | ICD-10-CM | POA: Diagnosis not present

## 2023-06-05 DIAGNOSIS — G4733 Obstructive sleep apnea (adult) (pediatric): Secondary | ICD-10-CM | POA: Diagnosis not present

## 2023-06-05 DIAGNOSIS — Z955 Presence of coronary angioplasty implant and graft: Secondary | ICD-10-CM | POA: Diagnosis not present

## 2023-06-05 DIAGNOSIS — J439 Emphysema, unspecified: Secondary | ICD-10-CM | POA: Diagnosis not present

## 2023-06-05 DIAGNOSIS — I209 Angina pectoris, unspecified: Secondary | ICD-10-CM | POA: Diagnosis not present

## 2023-06-05 DIAGNOSIS — I2729 Other secondary pulmonary hypertension: Secondary | ICD-10-CM | POA: Diagnosis not present

## 2023-06-05 DIAGNOSIS — I10 Essential (primary) hypertension: Secondary | ICD-10-CM | POA: Diagnosis not present

## 2023-06-05 DIAGNOSIS — E66812 Obesity, class 2: Secondary | ICD-10-CM | POA: Diagnosis not present

## 2023-06-06 ENCOUNTER — Encounter: Payer: Self-pay | Admitting: *Deleted

## 2023-07-02 DIAGNOSIS — E1142 Type 2 diabetes mellitus with diabetic polyneuropathy: Secondary | ICD-10-CM | POA: Diagnosis not present

## 2023-07-02 DIAGNOSIS — Z780 Asymptomatic menopausal state: Secondary | ICD-10-CM | POA: Diagnosis not present

## 2023-07-02 LAB — COMPREHENSIVE METABOLIC PANEL
Calcium: 9.7 (ref 8.7–10.7)
eGFR: 79

## 2023-07-02 LAB — BASIC METABOLIC PANEL
BUN: 11 (ref 4–21)
CO2: 27 — AB (ref 13–22)
Chloride: 107 (ref 99–108)
Creatinine: 0.8 (ref 0.5–1.1)
Glucose: 193
Potassium: 4 meq/L (ref 3.5–5.1)
Sodium: 142 (ref 137–147)

## 2023-07-02 LAB — PROTEIN / CREATININE RATIO, URINE
Albumin, U: 9
Creatinine, Urine: 67.4

## 2023-07-02 LAB — HEMOGLOBIN A1C: Hemoglobin A1C: 6.7

## 2023-07-02 LAB — MICROALBUMIN / CREATININE URINE RATIO: Microalb Creat Ratio: 13.4

## 2023-07-09 DIAGNOSIS — E1142 Type 2 diabetes mellitus with diabetic polyneuropathy: Secondary | ICD-10-CM | POA: Diagnosis not present

## 2023-07-09 DIAGNOSIS — E785 Hyperlipidemia, unspecified: Secondary | ICD-10-CM | POA: Diagnosis not present

## 2023-07-09 DIAGNOSIS — E1169 Type 2 diabetes mellitus with other specified complication: Secondary | ICD-10-CM | POA: Diagnosis not present

## 2023-07-09 DIAGNOSIS — E1159 Type 2 diabetes mellitus with other circulatory complications: Secondary | ICD-10-CM | POA: Diagnosis not present

## 2023-07-09 DIAGNOSIS — Z794 Long term (current) use of insulin: Secondary | ICD-10-CM | POA: Diagnosis not present

## 2023-07-09 LAB — HM DIABETES FOOT EXAM

## 2023-09-20 ENCOUNTER — Encounter: Payer: Self-pay | Admitting: Family Medicine

## 2023-10-05 ENCOUNTER — Encounter: Payer: Self-pay | Admitting: Emergency Medicine

## 2023-10-25 ENCOUNTER — Other Ambulatory Visit: Payer: Self-pay | Admitting: Family Medicine

## 2023-10-25 DIAGNOSIS — E1143 Type 2 diabetes mellitus with diabetic autonomic (poly)neuropathy: Secondary | ICD-10-CM

## 2023-10-31 ENCOUNTER — Other Ambulatory Visit: Payer: Self-pay | Admitting: Family Medicine

## 2023-10-31 DIAGNOSIS — I209 Angina pectoris, unspecified: Secondary | ICD-10-CM

## 2023-10-31 DIAGNOSIS — I700 Atherosclerosis of aorta: Secondary | ICD-10-CM

## 2023-11-13 ENCOUNTER — Other Ambulatory Visit: Payer: Self-pay | Admitting: Family Medicine

## 2023-11-13 DIAGNOSIS — J3089 Other allergic rhinitis: Secondary | ICD-10-CM

## 2023-11-13 DIAGNOSIS — I209 Angina pectoris, unspecified: Secondary | ICD-10-CM

## 2023-11-13 DIAGNOSIS — I10 Essential (primary) hypertension: Secondary | ICD-10-CM

## 2023-11-13 DIAGNOSIS — I700 Atherosclerosis of aorta: Secondary | ICD-10-CM

## 2023-11-13 DIAGNOSIS — F325 Major depressive disorder, single episode, in full remission: Secondary | ICD-10-CM

## 2023-11-13 DIAGNOSIS — N951 Menopausal and female climacteric states: Secondary | ICD-10-CM

## 2023-11-20 ENCOUNTER — Encounter: Payer: Self-pay | Admitting: Family Medicine

## 2023-11-20 ENCOUNTER — Ambulatory Visit: Payer: Medicare PPO | Admitting: Family Medicine

## 2023-11-20 VITALS — BP 124/76 | HR 84 | Resp 16 | Ht 65.5 in | Wt 212.3 lb

## 2023-11-20 DIAGNOSIS — J3089 Other allergic rhinitis: Secondary | ICD-10-CM

## 2023-11-20 DIAGNOSIS — I739 Peripheral vascular disease, unspecified: Secondary | ICD-10-CM | POA: Diagnosis not present

## 2023-11-20 DIAGNOSIS — M109 Gout, unspecified: Secondary | ICD-10-CM

## 2023-11-20 DIAGNOSIS — J432 Centrilobular emphysema: Secondary | ICD-10-CM

## 2023-11-20 DIAGNOSIS — Z79899 Other long term (current) drug therapy: Secondary | ICD-10-CM

## 2023-11-20 DIAGNOSIS — I209 Angina pectoris, unspecified: Secondary | ICD-10-CM

## 2023-11-20 DIAGNOSIS — I10 Essential (primary) hypertension: Secondary | ICD-10-CM

## 2023-11-20 DIAGNOSIS — F325 Major depressive disorder, single episode, in full remission: Secondary | ICD-10-CM

## 2023-11-20 DIAGNOSIS — I700 Atherosclerosis of aorta: Secondary | ICD-10-CM | POA: Diagnosis not present

## 2023-11-20 DIAGNOSIS — E1169 Type 2 diabetes mellitus with other specified complication: Secondary | ICD-10-CM

## 2023-11-20 DIAGNOSIS — Z1211 Encounter for screening for malignant neoplasm of colon: Secondary | ICD-10-CM

## 2023-11-20 DIAGNOSIS — N951 Menopausal and female climacteric states: Secondary | ICD-10-CM

## 2023-11-20 DIAGNOSIS — K219 Gastro-esophageal reflux disease without esophagitis: Secondary | ICD-10-CM

## 2023-11-20 DIAGNOSIS — I272 Pulmonary hypertension, unspecified: Secondary | ICD-10-CM

## 2023-11-20 MED ORDER — VENLAFAXINE HCL ER 75 MG PO CP24: 75.0000 mg | ORAL_CAPSULE | Freq: Every day | ORAL | 1 refills | Status: DC

## 2023-11-20 MED ORDER — MONTELUKAST SODIUM 10 MG PO TABS: 10.0000 mg | ORAL_TABLET | Freq: Every evening | ORAL | 1 refills | Status: DC

## 2023-11-20 MED ORDER — METOPROLOL SUCCINATE ER 50 MG PO TB24: 50.0000 mg | ORAL_TABLET | Freq: Every day | ORAL | 1 refills | Status: DC

## 2023-11-20 MED ORDER — PANTOPRAZOLE SODIUM 40 MG PO TBEC: 40.0000 mg | DELAYED_RELEASE_TABLET | Freq: Every morning | ORAL | 1 refills | Status: DC

## 2023-11-20 MED ORDER — CLONIDINE HCL 0.1 MG PO TABS
0.1000 mg | ORAL_TABLET | Freq: Every evening | ORAL | 1 refills | Status: DC
Start: 2023-11-20 — End: 2024-05-22

## 2023-11-20 MED ORDER — LOSARTAN POTASSIUM 50 MG PO TABS: 50.0000 mg | ORAL_TABLET | Freq: Every day | ORAL | 1 refills | Status: DC

## 2023-11-20 MED ORDER — ISOSORBIDE MONONITRATE ER 60 MG PO TB24: 60.0000 mg | ORAL_TABLET | Freq: Every day | ORAL | 1 refills | Status: DC

## 2023-11-20 MED ORDER — LORATADINE 10 MG PO TABS: 10.0000 mg | ORAL_TABLET | Freq: Every day | ORAL | 1 refills | Status: DC

## 2023-11-20 MED ORDER — ESTRADIOL 0.5 MG PO TABS
0.5000 mg | ORAL_TABLET | Freq: Every day | ORAL | 1 refills | Status: DC
Start: 2023-11-20 — End: 2024-05-22

## 2023-11-20 MED ORDER — ALLOPURINOL 100 MG PO TABS: 100.0000 mg | ORAL_TABLET | Freq: Every day | ORAL | 1 refills | Status: DC

## 2023-11-20 MED ORDER — FLUTICASONE-SALMETEROL 250-50 MCG/ACT IN AEPB: 1.0000 | INHALATION_SPRAY | Freq: Two times a day (BID) | RESPIRATORY_TRACT | 1 refills | Status: AC

## 2023-11-20 MED ORDER — AMLODIPINE BESYLATE 2.5 MG PO TABS
2.5000 mg | ORAL_TABLET | Freq: Every day | ORAL | 1 refills | Status: DC
Start: 2023-11-20 — End: 2024-05-22

## 2023-11-20 NOTE — Progress Notes (Signed)
 Name: Dana Sparks   MRN: [id number]    DOB: [birth year]   Date:11/20/2023       Progress Note  Subjective  Chief Complaint  Chief Complaint  Patient presents with   Medical Management of Chronic Issues   HPI   Obesity: BMI below 35, eating healthier, takes Trulicity for DM and is doing well.    Hyperlipidemia/Atherosclerosis of aorta: taking statin therapy, she brought her medications today and was taking Atorvastatin 10 mg ( husband rx by accident ) and Rosuvastatin 20 mg  ( her rx ) ,  reviewed last labs with patient , LDL was below 40 Fall 2024 . She will stop Atorvastatin now    Controlled gout: doing well , no symptoms lately , continue allopurinol , we will recheck labs today    DMII with gastroparesis/CKI/neuropathy: she is taking medication as prescribed, seeing Dr. Tedd Sias.  Her hgbA1C has been well controlled, last time it was checked it was 6..7 % . She is on Trulicity 1.5 %  Synjardi and Basaglar 60 units. She is getting it through [employer] and has been compliant with mediation. She has gastroparesis controlled with reglan before meals, usually twice a day,, dyslipidemia on crestor, on ARB for microalbuminuria. She denies polyphagia, olyuria or polydipsia. She has vascular disease and intermittent claudication and CAD and takes one aspirin daily.   HTN: she was on Norvasc 2.5 mg , Losartan and Toprol xl, bp is at goal. She denies any recent episodes of chest pain or palpitation    CAD with stable angina/Pulmonary HTN:  controlled with medications:  seeing cardiologist - Dr. Juliann Pares   She takes statin therapy, aspirin , beta blocker and ARB  and Imdur , angina under control with medication  management   Last echo was greatly improved with EF of 55 % , she is taking SGL-2 agonist . She has orthopnea, uses two pillows . Declines lower extremity edema . Previous Echo from 2016 showed EF 20-30 %    FINDINGS: Myoview done 10/21/2020  EF 64 % Regional wall motion:   reveals normal myocardial thickening and wall  motion.  The overall quality of the study is good.    Artifacts noted: no  Left ventricular cavity: normal.   Perfusion Analysis:  SPECT images demonstrate homogeneous tracer  distribution throughout the myocardium. Defect type : Normal     OSA: she stopping using her CPAP machine, explained she has pulmonary hypertension needs to wear CPAP 7 nights for at least 4 hours . Unchanged    Major Depression in remission  she is doing well on Effexor, phq 9 has bee normal, no side effects of medication . She continues medications since it helps with hot flashes    Chronic bronchitis/ Emphysema :  she used to smoke 1 pack daily for 50 years, quit smoking in 2014 she is now on Advair and no longer a productive cough in am, no SOB or wheezing . Last CT scan was 2021,she no longer wants to get CT lung cancer screening  She takes Advair daily    PVD with claudication/Atherosclerosis of aorta: on aspirin and statin therapy, she  states she has been walking twice a week for about half a mile.. Unchanged , does not want to see vascular surgeon    Hot  Flashes: she is on Effexor , Premarin, black cohosh,  and clonidine, a few years ago her symptoms were severe when she stopped Effexor.  Stable    Patient Active  Problem List   Diagnosis Date Noted   Major depression in remission (HCC) 05/22/2022   Controlled type 2 diabetes mellitus with complication, with long-term current use of insulin (HCC) 05/22/2022   Dyslipidemia associated with type 2 diabetes mellitus (HCC) 05/22/2022   Centrilobular emphysema (HCC) 07/09/2020   Hypercalcemia 07/05/2020   Simple chronic bronchitis (HCC) 02/22/2020   Hx of colonic polyps    Hepatomegaly 07/23/2018   Fatty liver 07/23/2018   Atherosclerosis of abdominal aorta (HCC) 07/09/2018   Diabetes mellitus type 2, controlled, with complications (HCC) 09/03/2017   Nasal polyp 09/03/2017   Lipoma of back 09/03/2017    Claudication (HCC) 06/04/2017   LVH (left ventricular hypertrophy) 02/27/2017   Decreased cardiac ejection fraction 02/27/2017   Left hand weakness 10/12/2016   Elevated antinuclear antibody (ANA) level 04/27/2016   Angina pectoris (HCC) 11/01/2015   Well controlled type 2 diabetes mellitus with gastroparesis (HCC) 06/22/2015   Low TSH level 06/22/2015   Iron deficiency anemia 04/07/2015   Intestinal metaplasia of gastric mucosa 03/11/2015   CAD (coronary artery disease), native coronary artery 02/24/2015   History of coronary artery stent placement 02/24/2015   Chronic kidney disease, stage 3, mod decreased GFR (HCC) 02/24/2015   Menopause 02/24/2015   History of Helicobacter pylori infection 02/24/2015   Mild mitral insufficiency 02/24/2015   Mild tricuspid insufficiency 02/24/2015   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 (HCC) 02/24/2015   Controlled gout 02/24/2015   Depression, major, recurrent, mild (HCC) 02/24/2015   Mild pulmonary hypertension (HCC) 02/24/2015   Gastroesophageal reflux disease without esophagitis 02/24/2015   Perennial allergic rhinitis 02/24/2015   HLD (hyperlipidemia) 02/20/2015   Hypertension, benign 02/20/2015   Obstructive sleep apnea syndrome 02/20/2015   Controlled diabetes mellitus with stage 3 chronic kidney disease, without long-term current use of insulin (HCC) 02/20/2015    Past Surgical History:  Procedure Laterality Date   ABDOMINAL HYSTERECTOMY     total   BREAST BIOPSY Right 06/07/2022   stereo bx, calcs, COIL clip-FIBROADENOMATOID CHANGE   CATARACT EXTRACTION     CATARACT EXTRACTION W/PHACO Left 03/24/2019   Procedure: CATARACT EXTRACTION PHACO AND INTRAOCULAR LENS PLACEMENT (IOC)  LEFT DIABETIC;  Surgeon: Nevada Crane, MD;  Location: [facility];  Service: Ophthalmology;  Laterality: Left;  Diabetic - insulin and oral meds sleep apnea   COLONOSCOPY  01/02/2014   sigmoid diverticulosis. desc  colon TA, hyperplastic polyp   COLONOSCOPY WITH PROPOFOL N/A 09/02/2018   Procedure: COLONOSCOPY WITH PROPOFOL;  Surgeon: Midge Minium, MD;  Location: [facility];  Service: Endoscopy;  Laterality: N/A;   CORONARY ANGIOPLASTY WITH STENT PLACEMENT     ESOPHAGOGASTRODUODENOSCOPY N/A 02/16/2015   negative h pylori, focal gastic intestinal metaplasia   ESOPHAGOGASTRODUODENOSCOPY (EGD) WITH PROPOFOL N/A 09/02/2018   Procedure: ESOPHAGOGASTRODUODENOSCOPY (EGD) WITH BIOPSIES;  Surgeon: Midge Minium, MD;  Location: [facility];  Service: Endoscopy;  Laterality: N/A;  diabetic -insulin and oral meds sleep apnea   TOTAL KNEE ARTHROPLASTY Right     Family History  Problem Relation Age of Onset   Stroke Sister    Hyperlipidemia Sister    Alcohol abuse Brother    Diabetes Brother    Stroke Brother    Hypertension Mother    Diabetes Mother    Heart disease Mother    Diabetes Father    Heart disease Father    Hypertension Father    Hypertension Sister    Cancer Maternal Grandmother  Unsure   Diabetes Maternal Grandfather    Colon cancer Neg Hx    Liver disease Neg Hx    Breast cancer Neg Hx     Social History   Tobacco Use   Smoking status: Former    Current packs/day: 0.00    Average packs/day: 1 pack/day for 40.3 years (40.3 ttl pk-yrs)    Types: Cigarettes    Start date: 09/04/1972    Quit date: 12/30/2012    Years since quitting: 10.8   Smokeless tobacco: Never   Tobacco comments:    smoking cessation materials not required  Substance Use Topics   Alcohol use: No    Alcohol/week: 0.0 standard drinks of alcohol     Current Outpatient Medications:    ACCU-CHEK FASTCLIX LANCETS MISC, , Disp: , Rfl:    ACCU-CHEK SMARTVIEW test strip, Check fsbs two times daily  DMII, Disp: 100 each, Rfl: 6   Alcohol Swabs (B-D SINGLE USE SWABS REGULAR) PADS, 1 each by Does not apply route 4 (four) times daily., Disp: 200 each, Rfl: 2   aspirin EC 81 MG tablet, Take 81 mg  by mouth every morning., Disp: , Rfl:    BD INSULIN SYRINGE U/F 31G X 5/16" 0.3 ML MISC, , Disp: , Rfl:    blood glucose meter kit and supplies KIT, Dispense based on patient and insurance preference. Use up to four times daily as directed. (FOR ICD-9 250.00, 250.01)., Disp: 1 each, Rfl: 0   Cholecalciferol (VITAMIN D3) 50 MCG (2000 UT) TABS, Take 2,000 Units by mouth daily., Disp: , Rfl:    Coenzyme Q10 (CO Q-10) 100 MG CAPS, TAKE 1 CAPSULE EVERY DAY, Disp: 90 capsule, Rfl: 3   Dulaglutide (TRULICITY) 1.5 MG/0.5ML SOPN, Inject 1.5 mg into the skin once a week., Disp: , Rfl:    Ferrous Sulfate (IRON) 325 (65 Fe) MG TABS, Take 325 mg by mouth daily. Nature made brand-, Disp: , Rfl:    ibuprofen (ADVIL) 800 MG tablet, Take 800 mg by mouth every 6 (six) hours as needed., Disp: , Rfl:    Insulin Glargine (BASAGLAR KWIKPEN) 100 UNIT/ML SOPN, Inject 60 Units into the skin at bedtime., Disp: , Rfl:    Insulin Pen Needle (FIFTY50 PEN NEEDLES) 31G X 5 MM MISC, Use once daily, Disp: , Rfl:    ipratropium (ATROVENT) 0.03 % nasal spray, Place 2 sprays into both nostrils every 12 (twelve) hours., Disp: 90 mL, Rfl: 1   metoCLOPramide (REGLAN) 5 MG tablet, TAKE 1 TABLET TWICE DAILY, Disp: 180 tablet, Rfl: 3   rosuvastatin (CRESTOR) 20 MG tablet, TAKE 1 TABLET (20 MG TOTAL) BY MOUTH DAILY., Disp: 90 tablet, Rfl: 3   SYNJARDY XR 12.01-999 MG TB24, Take 2 tablets by mouth daily., Disp: , Rfl:    allopurinol (ZYLOPRIM) 100 MG tablet, Take 1 tablet (100 mg total) by mouth daily., Disp: 90 tablet, Rfl: 1   amLODipine (NORVASC) 2.5 MG tablet, Take 1 tablet (2.5 mg total) by mouth daily., Disp: 90 tablet, Rfl: 1   cloNIDine (CATAPRES) 0.1 MG tablet, Take 1 tablet (0.1 mg total) by mouth every evening., Disp: 90 tablet, Rfl: 1   estradiol (ESTRACE) 0.5 MG tablet, Take 1 tablet (0.5 mg total) by mouth daily., Disp: 90 tablet, Rfl: 1   fluticasone-salmeterol (ADVAIR) 250-50 MCG/ACT AEPB, Inhale 1 puff into the lungs in the  morning and at bedtime., Disp: 180 each, Rfl: 1   isosorbide mononitrate (IMDUR) 60 MG 24 hr tablet, Take 1 tablet (60 mg  total) by mouth daily., Disp: 90 tablet, Rfl: 1   loratadine (CLARITIN) 10 MG tablet, Take 1 tablet (10 mg total) by mouth daily., Disp: 90 tablet, Rfl: 1   losartan (COZAAR) 50 MG tablet, Take 1 tablet (50 mg total) by mouth daily., Disp: 90 tablet, Rfl: 1   metoprolol succinate (TOPROL-XL) 50 MG 24 hr tablet, Take 1 tablet (50 mg total) by mouth daily. Take with or immediately following a meal., Disp: 90 tablet, Rfl: 1   montelukast (SINGULAIR) 10 MG tablet, Take 1 tablet (10 mg total) by mouth every evening., Disp: 90 tablet, Rfl: 1   pantoprazole (PROTONIX) 40 MG tablet, Take 1 tablet (40 mg total) by mouth every morning., Disp: 90 tablet, Rfl: 1   venlafaxine XR (EFFEXOR-XR) 75 MG 24 hr capsule, Take 1 capsule (75 mg total) by mouth daily., Disp: 90 capsule, Rfl: 1  Allergies  Allergen Reactions   Codeine Other (See Comments)    Unknown reaction   Contrast Media [Iodinated Contrast Media] Itching   Lisinopril Cough   Sulfa Antibiotics Itching    I personally reviewed active problem list, medication list, allergies with the patient/caregiver today.   ROS  Ten systems reviewed and is negative except as mentioned in HPI    Objective  Vitals:   11/20/23 0814  BP: 124/76  Pulse: 84  Resp: 16  SpO2: 98%  Weight: 212 lb 4.8 oz (96.3 kg)  Height: 5' 5.5" (1.664 m)    Body mass index is 34.79 kg/m.  Physical Exam  Constitutional: Patient appears well-developed and well-nourished. Obese  No distress.  HEENT: head atraumatic, normocephalic, pupils equal and reactive to light, neck supple Cardiovascular: Normal rate, regular rhythm and normal heart sounds.  No murmur heard. No BLE edema. Pulmonary/Chest: Effort normal and breath sounds normal. No respiratory distress. Abdominal: Soft.  There is no tenderness. Psychiatric: Patient has a normal mood and  affect. behavior is normal. Judgment and thought content normal.    Diabetic Foot Exam:     PHQ2/9:    11/20/2023    8:06 AM 05/31/2023   11:56 AM 05/23/2023    8:26 AM 11/20/2022   10:55 AM 06/26/2022    3:28 PM  Depression screen PHQ 2/9  Decreased Interest 0 0 0 0 0  Down, Depressed, Hopeless 0 0 0 0 0  PHQ - 2 Score 0 0 0 0 0  Altered sleeping 0  0 0 0  Tired, decreased energy 0  0 0 0  Change in appetite 0  0 0 0  Feeling bad or failure about yourself  0  0 0 0  Trouble concentrating 0  0 0 0  Moving slowly or fidgety/restless 0  0 0 0  Suicidal thoughts 0  0 0 0  PHQ-9 Score 0  0 0 0  Difficult doing work/chores Not difficult at all    Not difficult at all    phq 9 is negative  Fall Risk:    11/20/2023    8:04 AM 05/31/2023   11:54 AM 05/23/2023    8:26 AM 11/20/2022   10:55 AM 06/26/2022    3:28 PM  Fall Risk   Falls in the past year? 0 0 0 0 0  Number falls in past yr: 0 0   0  Injury with Fall? 0 0   0  Risk for fall due to : No Fall Risks Other (Comment) No Fall Risks No Fall Risks   Follow up Falls prevention discussed;Education provided;Falls  evaluation completed Education provided;Falls prevention discussed Falls prevention discussed Falls prevention discussed;Education provided;Falls evaluation completed      Assessment & Plan  1. Centrilobular emphysema (HCC) (Primary)  - CBC with Differential/Platelet - fluticasone-salmeterol (ADVAIR) 250-50 MCG/ACT AEPB; Inhale 1 puff into the lungs in the morning and at bedtime.  Dispense: 180 each; Refill: 1  2. Atherosclerosis of abdominal aorta (HCC)  - metoprolol succinate (TOPROL-XL) 50 MG 24 hr tablet; Take 1 tablet (50 mg total) by mouth daily. Take with or immediately following a meal.  Dispense: 90 tablet; Refill: 1 Taking statin therapy   3. Dyslipidemia associated with type 2 diabetes mellitus (HCC)  Stay on Crestor   4. Claudication (HCC)  Stable, advised to walk daily   5. Angina pectoris  (HCC)  - isosorbide mononitrate (IMDUR) 60 MG 24 hr tablet; Take 1 tablet (60 mg total) by mouth daily.  Dispense: 90 tablet; Refill: 1  6. Mild pulmonary hypertension (HCC)  Refuses to use CPAP  7. Hypertension, benign  - amLODipine (NORVASC) 2.5 MG tablet; Take 1 tablet (2.5 mg total) by mouth daily.  Dispense: 90 tablet; Refill: 1 - losartan (COZAAR) 50 MG tablet; Take 1 tablet (50 mg total) by mouth daily.  Dispense: 90 tablet; Refill: 1 - metoprolol succinate (TOPROL-XL) 50 MG 24 hr tablet; Take 1 tablet (50 mg total) by mouth daily. Take with or immediately following a meal.  Dispense: 90 tablet; Refill: 1  8. Major depression in remission (HCC)  - venlafaxine XR (EFFEXOR-XR) 75 MG 24 hr capsule; Take 1 capsule (75 mg total) by mouth daily.  Dispense: 90 capsule; Refill: 1  9. Controlled gout  - Uric acid - allopurinol (ZYLOPRIM) 100 MG tablet; Take 1 tablet (100 mg total) by mouth daily.  Dispense: 90 tablet; Refill: 1  10. Screening for colon cancer  - Ambulatory referral to Gastroenterology  11. Gastroesophageal reflux disease without esophagitis  - pantoprazole (PROTONIX) 40 MG tablet; Take 1 tablet (40 mg total) by mouth every morning.  Dispense: 90 tablet; Refill: 1  12. Long-term use of high-risk medication  - COMPLETE METABOLIC PANEL WITH GFR  13. Menopausal vasomotor syndrome  - cloNIDine (CATAPRES) 0.1 MG tablet; Take 1 tablet (0.1 mg total) by mouth every evening.  Dispense: 90 tablet; Refill: 1 - estradiol (ESTRACE) 0.5 MG tablet; Take 1 tablet (0.5 mg total) by mouth daily.  Dispense: 90 tablet; Refill: 1 - venlafaxine XR (EFFEXOR-XR) 75 MG 24 hr capsule; Take 1 capsule (75 mg total) by mouth daily.  Dispense: 90 capsule; Refill: 1  14. Perennial allergic rhinitis  - loratadine (CLARITIN) 10 MG tablet; Take 1 tablet (10 mg total) by mouth daily.  Dispense: 90 tablet; Refill: 1 - montelukast (SINGULAIR) 10 MG tablet; Take 1 tablet (10 mg total) by mouth  every evening.  Dispense: 90 tablet; Refill: 1

## 2023-11-21 ENCOUNTER — Encounter: Payer: Self-pay | Admitting: Family Medicine

## 2023-11-21 LAB — CBC WITH DIFFERENTIAL/PLATELET
Absolute Lymphocytes: 3468 {cells}/uL (ref 850–3900)
Absolute Monocytes: 715 {cells}/uL (ref 200–950)
Basophils Absolute: 37 {cells}/uL (ref 0–200)
Basophils Relative: 0.5 %
Eosinophils Absolute: 292 {cells}/uL (ref 15–500)
Eosinophils Relative: 4 %
HCT: 43.8 % (ref 35.0–45.0)
Hemoglobin: 15 g/dL (ref 11.7–15.5)
MCH: 31.4 pg (ref 27.0–33.0)
MCHC: 34.2 g/dL (ref 32.0–36.0)
MCV: 91.8 fL (ref 80.0–100.0)
MPV: 9.5 fL (ref 7.5–12.5)
Monocytes Relative: 9.8 %
Neutro Abs: 2789 {cells}/uL (ref 1500–7800)
Neutrophils Relative %: 38.2 %
Platelets: 288 10*3/uL (ref 140–400)
RBC: 4.77 10*6/uL (ref 3.80–5.10)
RDW: 12.7 % (ref 11.0–15.0)
Total Lymphocyte: 47.5 %
WBC: 7.3 10*3/uL (ref 3.8–10.8)

## 2023-11-21 LAB — COMPREHENSIVE METABOLIC PANEL
AG Ratio: 1.8 (calc) (ref 1.0–2.5)
ALT: 9 U/L (ref 6–29)
AST: 15 U/L (ref 10–35)
Albumin: 4.4 g/dL (ref 3.6–5.1)
Alkaline phosphatase (APISO): 60 U/L (ref 37–153)
BUN: 12 mg/dL (ref 7–25)
CO2: 26 mmol/L (ref 20–32)
Calcium: 9.8 mg/dL (ref 8.6–10.4)
Chloride: 106 mmol/L (ref 98–110)
Creat: 0.84 mg/dL (ref 0.60–1.00)
Globulin: 2.5 g/dL (ref 1.9–3.7)
Glucose, Bld: 111 mg/dL — ABNORMAL HIGH (ref 65–99)
Potassium: 4.4 mmol/L (ref 3.5–5.3)
Sodium: 142 mmol/L (ref 135–146)
Total Bilirubin: 0.3 mg/dL (ref 0.2–1.2)
Total Protein: 6.9 g/dL (ref 6.1–8.1)
eGFR: 74 mL/min/{1.73_m2} (ref >=60)

## 2023-11-21 LAB — URIC ACID: Uric Acid, Serum: 3.7 mg/dL (ref 2.5–7.0)

## 2024-01-03 LAB — MICROALBUMIN / CREATININE URINE RATIO: Microalb Creat Ratio: 17.6

## 2024-01-03 LAB — LIPID PANEL
Cholesterol: 108 (ref 0–200)
HDL: 40 (ref 35–70)
LDL Cholesterol: 43
Triglycerides: 128 (ref 40–160)

## 2024-01-03 LAB — COMPREHENSIVE METABOLIC PANEL WITH GFR: eGFR: 68

## 2024-01-03 LAB — BASIC METABOLIC PANEL WITH GFR: Glucose: 138

## 2024-01-03 LAB — HEMOGLOBIN A1C: Hemoglobin A1C: 6.7

## 2024-01-07 LAB — HM DIABETES EYE EXAM

## 2024-01-11 ENCOUNTER — Other Ambulatory Visit: Payer: Self-pay | Admitting: Family Medicine

## 2024-01-11 DIAGNOSIS — M791 Myalgia, unspecified site: Secondary | ICD-10-CM

## 2024-01-15 ENCOUNTER — Ambulatory Visit (INDEPENDENT_AMBULATORY_CARE_PROVIDER_SITE_OTHER): Admitting: Family Medicine

## 2024-01-15 ENCOUNTER — Encounter: Payer: Self-pay | Admitting: Family Medicine

## 2024-01-15 VITALS — BP 114/76 | HR 92 | Resp 16 | Ht 65.5 in | Wt 210.0 lb

## 2024-01-15 DIAGNOSIS — L304 Erythema intertrigo: Secondary | ICD-10-CM | POA: Diagnosis not present

## 2024-01-15 DIAGNOSIS — R159 Full incontinence of feces: Secondary | ICD-10-CM | POA: Diagnosis not present

## 2024-01-15 MED ORDER — CLOTRIMAZOLE 1 % EX CREA: 1.0000 | TOPICAL_CREAM | Freq: Two times a day (BID) | CUTANEOUS | 1 refills | Status: AC

## 2024-01-15 MED ORDER — TALC EX POWD: CUTANEOUS | 0 refills | Status: AC | PRN

## 2024-01-15 NOTE — Progress Notes (Signed)
 Name: Dana Sparks   MRN: [id number]    DOB: [birth year]   Date:01/15/2024       Progress Note  Subjective  Chief Complaint  Chief Complaint  Patient presents with   Breast Problem    Having recurrent yeast infections underneath bilateral breast. Pt has treated it on her own this past one    Discussed the use of AI scribe software for clinical note transcription with the patient, who gave verbal consent to proceed.  History of Present Illness Dana JEREMIAH "Lenon Radar" is a 73 year old female with diabetes who presents with a rash and bowel incontinence.  She has a recurrent rash primarily under her breasts, occasionally extending to her groin. The rash is itchy and inflamed, triggered by heat and moisture. She uses regular powders for management, and the rash is not currently present in the groin area.  She experiences bowel incontinence for the past three to four weeks, characterized by an inability to feel bowel movements, especially when urinating. She sometimes discovers bowel movements after standing up from the toilet. No back pain, numbness, or weakness in her legs. Stool consistency is normal, with no constipation, blood, mucus, or rectal pain. She experiences smearing rather than full bowel movements in her underwear.  Her diabetes is well-controlled with an A1c of 6.7. No changes in weight and maintains good bladder control.    Patient Active Problem List   Diagnosis Date Noted   Major depression in remission (HCC) 05/22/2022   Controlled type 2 diabetes mellitus with complication, with long-term current use of insulin  (HCC) 05/22/2022   Dyslipidemia associated with type 2 diabetes mellitus (HCC) 05/22/2022   Centrilobular emphysema (HCC) 07/09/2020   Hypercalcemia 07/05/2020   Simple chronic bronchitis (HCC) 02/22/2020   Hx of colonic polyps    Hepatomegaly 07/23/2018   Fatty liver 07/23/2018   Atherosclerosis of abdominal aorta (HCC) 07/09/2018   Diabetes mellitus type 2,  controlled, with complications (HCC) 09/03/2017   Nasal polyp 09/03/2017   Lipoma of back 09/03/2017   Claudication (HCC) 06/04/2017   LVH (left ventricular hypertrophy) 02/27/2017   Decreased cardiac ejection fraction 02/27/2017   Left hand weakness 10/12/2016   Elevated antinuclear antibody (ANA) level 04/27/2016   Angina pectoris (HCC) 11/01/2015   Well controlled type 2 diabetes mellitus with gastroparesis (HCC) 06/22/2015   Low TSH level 06/22/2015   Iron deficiency anemia 04/07/2015   Intestinal metaplasia of gastric mucosa 03/11/2015   CAD (coronary artery disease), native coronary artery 02/24/2015   History of coronary artery stent placement 02/24/2015   Chronic kidney disease, stage 3, mod decreased GFR (HCC) 02/24/2015   Menopause 02/24/2015   History of Helicobacter pylori infection 02/24/2015   Mild mitral insufficiency 02/24/2015   Mild tricuspid insufficiency 02/24/2015   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 (HCC) 02/24/2015   Controlled gout 02/24/2015   Depression, major, recurrent, mild (HCC) 02/24/2015   Mild pulmonary hypertension (HCC) 02/24/2015   Gastroesophageal reflux disease without esophagitis 02/24/2015   Perennial allergic rhinitis 02/24/2015   HLD (hyperlipidemia) 02/20/2015   Hypertension, benign 02/20/2015   Obstructive sleep apnea syndrome 02/20/2015   Controlled diabetes mellitus with stage 3 chronic kidney disease, without long-term current use of insulin  (HCC) 02/20/2015    Social History   Tobacco Use   Smoking status: Former    Current packs/day: 0.00    Average packs/day: 1 pack/day for 40.3 years (40.3 ttl pk-yrs)    Types: Cigarettes    Start date:  09/04/1972    Quit date: 12/30/2012    Years since quitting: 11.0   Smokeless tobacco: Never   Tobacco comments:    smoking cessation materials not required  Substance Use Topics   Alcohol use: No    Alcohol/week: 0.0 standard drinks of alcohol     Current  Outpatient Medications:    ACCU-CHEK FASTCLIX LANCETS MISC, , Disp: , Rfl:    ACCU-CHEK SMARTVIEW test strip, Check fsbs two times daily  DMII, Disp: 100 each, Rfl: 6   Alcohol Swabs (B-D SINGLE USE SWABS REGULAR) PADS, 1 each by Does not apply route 4 (four) times daily., Disp: 200 each, Rfl: 2   allopurinol  (ZYLOPRIM ) 100 MG tablet, Take 1 tablet (100 mg total) by mouth daily., Disp: 90 tablet, Rfl: 1   amLODipine  (NORVASC ) 2.5 MG tablet, Take 1 tablet (2.5 mg total) by mouth daily., Disp: 90 tablet, Rfl: 1   aspirin  EC 81 MG tablet, Take 81 mg by mouth every morning., Disp: , Rfl:    BD INSULIN  SYRINGE U/F 31G X 5/16" 0.3 ML MISC, , Disp: , Rfl:    blood glucose meter kit and supplies KIT, Dispense based on patient and insurance preference. Use up to four times daily as directed. (FOR ICD-9 250.00, 250.01)., Disp: 1 each, Rfl: 0   Cholecalciferol (VITAMIN D3) 50 MCG (2000 UT) TABS, Take 2,000 Units by mouth daily., Disp: , Rfl:    cloNIDine  (CATAPRES ) 0.1 MG tablet, Take 1 tablet (0.1 mg total) by mouth every evening., Disp: 90 tablet, Rfl: 1   Coenzyme Q10 (CO Q-10) 100 MG CAPS, TAKE 1 CAPSULE EVERY DAY, Disp: 90 capsule, Rfl: 3   Dulaglutide (TRULICITY) 1.5 MG/0.5ML SOPN, Inject 1.5 mg into the skin once a week., Disp: , Rfl:    estradiol  (ESTRACE ) 0.5 MG tablet, Take 1 tablet (0.5 mg total) by mouth daily., Disp: 90 tablet, Rfl: 1   Ferrous Sulfate  (IRON) 325 (65 Fe) MG TABS, Take 325 mg by mouth daily. Nature made brand-, Disp: , Rfl:    fluticasone -salmeterol (ADVAIR) 250-50 MCG/ACT AEPB, Inhale 1 puff into the lungs in the morning and at bedtime., Disp: 180 each, Rfl: 1   ibuprofen (ADVIL) 800 MG tablet, Take 800 mg by mouth every 6 (six) hours as needed., Disp: , Rfl:    Insulin  Glargine (BASAGLAR  KWIKPEN) 100 UNIT/ML SOPN, Inject 60 Units into the skin at bedtime., Disp: , Rfl:    Insulin  Pen Needle (FIFTY50 PEN NEEDLES) 31G X 5 MM MISC, Use once daily, Disp: , Rfl:    ipratropium  (ATROVENT ) 0.03 % nasal spray, Place 2 sprays into both nostrils every 12 (twelve) hours., Disp: 90 mL, Rfl: 1   isosorbide  mononitrate (IMDUR ) 60 MG 24 hr tablet, Take 1 tablet (60 mg total) by mouth daily., Disp: 90 tablet, Rfl: 1   loratadine  (CLARITIN ) 10 MG tablet, Take 1 tablet (10 mg total) by mouth daily., Disp: 90 tablet, Rfl: 1   losartan  (COZAAR ) 50 MG tablet, Take 1 tablet (50 mg total) by mouth daily., Disp: 90 tablet, Rfl: 1   metoCLOPramide  (REGLAN ) 5 MG tablet, TAKE 1 TABLET TWICE DAILY, Disp: 180 tablet, Rfl: 3   metoprolol  succinate (TOPROL -XL) 50 MG 24 hr tablet, Take 1 tablet (50 mg total) by mouth daily. Take with or immediately following a meal., Disp: 90 tablet, Rfl: 1   montelukast  (SINGULAIR ) 10 MG tablet, Take 1 tablet (10 mg total) by mouth every evening., Disp: 90 tablet, Rfl: 1   pantoprazole  (PROTONIX ) 40  MG tablet, Take 1 tablet (40 mg total) by mouth every morning., Disp: 90 tablet, Rfl: 1   rosuvastatin  (CRESTOR ) 20 MG tablet, TAKE 1 TABLET (20 MG TOTAL) BY MOUTH DAILY., Disp: 90 tablet, Rfl: 3   SYNJARDY XR 12.01-999 MG TB24, Take 2 tablets by mouth daily., Disp: , Rfl:    venlafaxine  XR (EFFEXOR -XR) 75 MG 24 hr capsule, Take 1 capsule (75 mg total) by mouth daily., Disp: 90 capsule, Rfl: 1  Allergies  Allergen Reactions   Codeine Other (See Comments)    Unknown reaction   Contrast Media [Iodinated Contrast Media] Itching   Lisinopril  Cough   Sulfa Antibiotics Itching    ROS  Ten systems reviewed and is negative except as mentioned in HPI    Objective  Vitals:   01/15/24 0919  BP: 114/76  Pulse: 92  Resp: 16  SpO2: 96%  Weight: 210 lb (95.3 kg)  Height: 5' 5.5" (1.664 m)    Body mass index is 34.41 kg/m.   Physical Exam  Constitutional: Patient appears well-developed and well-nourished. Obese  No distress.  HEENT: head atraumatic, normocephalic, pupils equal and reactive to light, neck supple Cardiovascular: Normal rate, regular rhythm  and normal heart sounds.  No murmur heard. No BLE edema. Pulmonary/Chest: Effort normal and breath sounds normal. No respiratory distress. Abdominal: Soft.  There is no tenderness. Rectal not done, referring to GI Skin: erythema and irritation under right breast some satellite lesions consistent with candida Psychiatric: Patient has a normal mood and affect. behavior is normal. Judgment and thought content normal.    Recent Results (from the past 2160 hours)  CBC with Differential/Platelet     Status: None   Collection Time: 11/20/23  8:45 AM  Result Value Ref Range   WBC 7.3 3.8 - 10.8 Thousand/uL   RBC 4.77 3.80 - 5.10 Million/uL   Hemoglobin 15.0 11.7 - 15.5 g/dL   HCT 81.1 91.4 - 78.2 %   MCV 91.8 80.0 - 100.0 fL   MCH 31.4 27.0 - 33.0 pg   MCHC 34.2 32.0 - 36.0 g/dL    Comment: For adults, a slight decrease in the calculated MCHC value (in the range of 30 to 32 g/dL) is most likely not clinically significant; however, it should be interpreted with caution in correlation with other red cell parameters and the patient's clinical condition.    RDW 12.7 11.0 - 15.0 %   Platelets 288 140 - 400 Thousand/uL   MPV 9.5 7.5 - 12.5 fL   Neutro Abs 2,789 1,500 - 7,800 cells/uL   Absolute Lymphocytes 3,468 850 - 3,900 cells/uL   Absolute Monocytes 715 200 - 950 cells/uL   Eosinophils Absolute 292 15 - 500 cells/uL   Basophils Absolute 37 0 - 200 cells/uL   Neutrophils Relative % 38.2 %   Total Lymphocyte 47.5 %   Monocytes Relative 9.8 %   Eosinophils Relative 4.0 %   Basophils Relative 0.5 %  Uric acid     Status: None   Collection Time: 11/20/23  8:45 AM  Result Value Ref Range   Uric Acid, Serum 3.7 2.5 - 7.0 mg/dL    Comment: Therapeutic target for gout patients: <6.0 mg/dL .   Comprehensive metabolic panel     Status: Abnormal   Collection Time: 11/20/23  8:45 AM  Result Value Ref Range   Glucose, Bld 111 (H) 65 - 99 mg/dL    Comment: .            Fasting  reference  interval . For someone without known diabetes, a glucose value between 100 and 125 mg/dL is consistent with prediabetes and should be confirmed with a follow-up test. .    BUN 12 7 - 25 mg/dL   Creat 5.18 8.41 - 6.60 mg/dL   eGFR 74 > OR = 60 YT/KZS/0.10X3   BUN/Creatinine Ratio SEE NOTE: 6 - 22 (calc)    Comment:    Not Reported: BUN and Creatinine are within    reference range. .    Sodium 142 135 - 146 mmol/L   Potassium 4.4 3.5 - 5.3 mmol/L   Chloride 106 98 - 110 mmol/L   CO2 26 20 - 32 mmol/L   Calcium  9.8 8.6 - 10.4 mg/dL   Total Protein 6.9 6.1 - 8.1 g/dL   Albumin  4.4 3.6 - 5.1 g/dL   Globulin 2.5 1.9 - 3.7 g/dL (calc)   AG Ratio 1.8 1.0 - 2.5 (calc)   Total Bilirubin 0.3 0.2 - 1.2 mg/dL   Alkaline phosphatase (APISO) 60 37 - 153 U/L   AST 15 10 - 35 U/L   ALT 9 6 - 29 U/L  HM DIABETES EYE EXAM     Status: None   Collection Time: 01/07/24  1:45 PM  Result Value Ref Range   HM Diabetic Eye Exam No Retinopathy No Retinopathy    Comment: ABS BY HIM    Assessment & Plan Bowel incontinence Recent onset of bowel incontinence without sensory awareness during bowel movements. Differential includes nerve or pelvic floor dysfunction. Urgent evaluation needed to prevent symptom progression. - Coordinate urgent referral to GI specialist at [facility]. - Discuss potential pelvic floor dysfunction and need for further evaluation. - Consider diagnostic testing for nerve function and pelvic floor integrity.  Intertrigo with candidiasis Intertrigo with candidiasis under breast and groin due to skin contact, heat, and moisture. Rash is itchy, recurrent, and not linked to specific triggers. - Prescribe clotrimazole cream for affected areas. - Advise Zeasorb powder for dryness. - Instruct to apply clotrimazole first, let dry, then apply Zeasorb. - Recommend hydrocortisone cream if clotrimazole stings. - Use Zeasorb regularly for prevention, clotrimazole during active  rash.  Type 2 diabetes mellitus, without complications Type 2 diabetes well-controlled with A1c of 6.7%. - Request Maricela to abstract recent lab results, including A1c, from Dr. Dagmar Drones records.

## 2024-01-16 NOTE — Addendum Note (Signed)
 Addended by: Arleen Lacer F on: 01/16/2024 08:26 AM   Modules accepted: Orders

## 2024-01-23 ENCOUNTER — Encounter: Payer: Self-pay | Admitting: *Deleted

## 2024-01-23 ENCOUNTER — Encounter: Payer: Self-pay | Admitting: Internal Medicine

## 2024-01-23 ENCOUNTER — Encounter: Payer: Self-pay | Admitting: Hematology and Oncology

## 2024-01-23 ENCOUNTER — Ambulatory Visit (INDEPENDENT_AMBULATORY_CARE_PROVIDER_SITE_OTHER): Admitting: Internal Medicine

## 2024-01-23 ENCOUNTER — Telehealth: Payer: Self-pay | Admitting: *Deleted

## 2024-01-23 VITALS — BP 123/74 | HR 77 | Temp 97.1°F | Ht 65.5 in | Wt 209.8 lb

## 2024-01-23 DIAGNOSIS — R151 Fecal smearing: Secondary | ICD-10-CM

## 2024-01-23 DIAGNOSIS — K648 Other hemorrhoids: Secondary | ICD-10-CM | POA: Diagnosis not present

## 2024-01-23 DIAGNOSIS — K219 Gastro-esophageal reflux disease without esophagitis: Secondary | ICD-10-CM | POA: Diagnosis not present

## 2024-01-23 DIAGNOSIS — Z860101 Personal history of adenomatous and serrated colon polyps: Secondary | ICD-10-CM

## 2024-01-23 DIAGNOSIS — Z860100 Personal history of colon polyps, unspecified: Secondary | ICD-10-CM

## 2024-01-23 NOTE — Telephone Encounter (Signed)
 PA approved via [employer]here Authorization #[id number] DOS: 01/25/2024 - 03/26/2024

## 2024-01-23 NOTE — Progress Notes (Signed)
 Primary Care Physician:  Sowles, Krichna, MD Primary Gastroenterologist:  Dr. Mordechai April  Chief Complaint  Patient presents with   New Patient (Initial Visit)    Patient here today due to having some issues with fecal incontinence. Symptoms ongoing for the last month. Patient denies any issues with constipation.     HPI:   Dana Sparks is a 73 y.o. female who presents to clinic today by referral from her PCP Dr. Ava Lei for evaluation.  Fecal smearing/incontinence: Reports the symptoms started approximately 1 to 2 months ago.  Noticed when she urinates she will have stool come out that she does not feel.  Also notes fecal smearing in her underwear at times.  Occasional bleeding if she "wipes too hard."  No rectal itching or pain.  Last colonoscopy 09/02/2018 unremarkable besides internal hemorrhoids.  She had tubular adenomas on colonoscopy 2015.  No family history of colorectal malignancy.  No unintentional weight loss.  Denies any abdominal pain.  Denies any issues with constipation.  States she has 1 normal bowel movement today, [place] stool chart 4.  Chronic GERD: Well-controlled on pantoprazole  40 mg daily.  No dysphagia odynophagia.  No epigastric or chest pain.  Past Medical History:  Diagnosis Date   Allergy    Anxiety    Arthritis    right knee   CAD (coronary artery disease)    COPD (chronic obstructive pulmonary disease) (HCC)    Depression    Diabetes mellitus without complication (HCC)    type 2   Diverticulitis    Gastroparesis    GERD (gastroesophageal reflux disease)    Gout    Hyperlipemia    Hypertension    Positive H. pylori test    Renal insufficiency    Sleep apnea    CPAP   Thrombocytosis 01/28/2015   Tubular adenoma of colon 01/20/14    Past Surgical History:  Procedure Laterality Date   ABDOMINAL HYSTERECTOMY     total   BREAST BIOPSY Right 06/07/2022   stereo bx, calcs, COIL clip-FIBROADENOMATOID CHANGE   CATARACT EXTRACTION     CATARACT  EXTRACTION W/PHACO Left 03/24/2019   Procedure: CATARACT EXTRACTION PHACO AND INTRAOCULAR LENS PLACEMENT (IOC)  LEFT DIABETIC;  Surgeon: Rosa College, MD;  Location: [facility];  Service: Ophthalmology;  Laterality: Left;  Diabetic - insulin  and oral meds sleep apnea   COLONOSCOPY  01/02/2014   sigmoid diverticulosis. desc colon TA, hyperplastic polyp   COLONOSCOPY WITH PROPOFOL  N/A 09/02/2018   Procedure: COLONOSCOPY WITH PROPOFOL ;  Surgeon: Marnee Sink, MD;  Location: [facility];  Service: Endoscopy;  Laterality: N/A;   CORONARY ANGIOPLASTY WITH STENT PLACEMENT     ESOPHAGOGASTRODUODENOSCOPY N/A 02/16/2015   negative h pylori, focal gastic intestinal metaplasia   ESOPHAGOGASTRODUODENOSCOPY (EGD) WITH PROPOFOL  N/A 09/02/2018   Procedure: ESOPHAGOGASTRODUODENOSCOPY (EGD) WITH BIOPSIES;  Surgeon: Marnee Sink, MD;  Location: [facility];  Service: Endoscopy;  Laterality: N/A;  diabetic -insulin  and oral meds sleep apnea   TOTAL KNEE ARTHROPLASTY Right     Current Outpatient Medications  Medication Sig Dispense Refill   ACCU-CHEK FASTCLIX LANCETS MISC      ACCU-CHEK SMARTVIEW test strip Check fsbs two times daily  DMII 100 each 6   Alcohol Swabs (B-D SINGLE USE SWABS REGULAR) PADS 1 each by Does not apply route 4 (four) times daily. 200 each 2   allopurinol  (ZYLOPRIM ) 100 MG tablet Take 1 tablet (100 mg total) by mouth daily. 90 tablet 1   amLODipine  (  NORVASC ) 2.5 MG tablet Take 1 tablet (2.5 mg total) by mouth daily. 90 tablet 1   aspirin  EC 81 MG tablet Take 81 mg by mouth every morning.     BD INSULIN  SYRINGE U/F 31G X 5/16" 0.3 ML MISC      blood glucose meter kit and supplies KIT Dispense based on patient and insurance preference. Use up to four times daily as directed. (FOR ICD-9 250.00, 250.01). 1 each 0   Cholecalciferol (VITAMIN D3) 50 MCG (2000 UT) TABS Take 2,000 Units by mouth daily.     cloNIDine  (CATAPRES ) 0.1 MG tablet Take 1 tablet (0.1 mg  total) by mouth every evening. 90 tablet 1   clotrimazole  (LOTRIMIN ) 1 % cream Apply 1 Application topically 2 (two) times daily. 113 g 1   Coenzyme Q10 (CO Q-10) 100 MG CAPS TAKE 1 CAPSULE EVERY DAY 90 capsule 3   Dulaglutide (TRULICITY) 1.5 MG/0.5ML SOPN Inject 1.5 mg into the skin once a week.     estradiol  (ESTRACE ) 0.5 MG tablet Take 1 tablet (0.5 mg total) by mouth daily. 90 tablet 1   Ferrous Sulfate  (IRON) 325 (65 Fe) MG TABS Take 325 mg by mouth daily. Nature made brand-     fluticasone -salmeterol (ADVAIR) 250-50 MCG/ACT AEPB Inhale 1 puff into the lungs in the morning and at bedtime. 180 each 1   ibuprofen (ADVIL) 800 MG tablet Take 800 mg by mouth every 6 (six) hours as needed.     Insulin  Glargine (BASAGLAR  KWIKPEN) 100 UNIT/ML SOPN Inject 60 Units into the skin at bedtime.     Insulin  Pen Needle (FIFTY50 PEN NEEDLES) 31G X 5 MM MISC Use once daily     ipratropium (ATROVENT ) 0.03 % nasal spray Place 2 sprays into both nostrils every 12 (twelve) hours. 90 mL 1   isosorbide  mononitrate (IMDUR ) 60 MG 24 hr tablet Take 1 tablet (60 mg total) by mouth daily. 90 tablet 1   loratadine  (CLARITIN ) 10 MG tablet Take 1 tablet (10 mg total) by mouth daily. 90 tablet 1   losartan  (COZAAR ) 50 MG tablet Take 1 tablet (50 mg total) by mouth daily. 90 tablet 1   metoCLOPramide  (REGLAN ) 5 MG tablet TAKE 1 TABLET TWICE DAILY 180 tablet 3   metoprolol  succinate (TOPROL -XL) 50 MG 24 hr tablet Take 1 tablet (50 mg total) by mouth daily. Take with or immediately following a meal. 90 tablet 1   montelukast  (SINGULAIR ) 10 MG tablet Take 1 tablet (10 mg total) by mouth every evening. 90 tablet 1   pantoprazole  (PROTONIX ) 40 MG tablet Take 1 tablet (40 mg total) by mouth every morning. 90 tablet 1   rosuvastatin  (CRESTOR ) 20 MG tablet TAKE 1 TABLET (20 MG TOTAL) BY MOUTH DAILY. 90 tablet 3   SYNJARDY XR 12.01-999 MG TB24 Take 2 tablets by mouth daily.     talc  powder Apply topically as needed. 240 g 0    venlafaxine  XR (EFFEXOR -XR) 75 MG 24 hr capsule Take 1 capsule (75 mg total) by mouth daily. 90 capsule 1   No current facility-administered medications for this visit.    Allergies as of 01/23/2024 - Review Complete 01/23/2024  Allergen Reaction Noted   Codeine Other (See Comments) 12/31/2014   Contrast media [iodinated contrast media] Itching 02/16/2015   Lisinopril  Cough 10/13/2020   Sulfa antibiotics Itching 02/16/2015    Family History  Problem Relation Age of Onset   Stroke Sister    Hyperlipidemia Sister    Alcohol abuse Brother  Diabetes Brother    Stroke Brother    Hypertension Mother    Diabetes Mother    Heart disease Mother    Diabetes Father    Heart disease Father    Hypertension Father    Hypertension Sister    Cancer Maternal Grandmother        Unsure   Diabetes Maternal Grandfather    Colon cancer Neg Hx    Liver disease Neg Hx    Breast cancer Neg Hx     Social History   Socioeconomic History   Marital status: Married    Spouse name: Hewitt Lou   Number of children: 2   Years of education: some college   Highest education level: 12th grade  Occupational History   Occupation: Disabled  Tobacco Use   Smoking status: Former    Current packs/day: 0.00    Average packs/day: 1 pack/day for 40.3 years (40.3 ttl pk-yrs)    Types: Cigarettes    Start date: 09/04/1972    Quit date: 12/30/2012    Years since quitting: 11.0   Smokeless tobacco: Never   Tobacco comments:    smoking cessation materials not required  Vaping Use   Vaping status: Never Used  Substance and Sexual Activity   Alcohol use: No    Alcohol/week: 0.0 standard drinks of alcohol   Drug use: No   Sexual activity: Not Currently  Other Topics Concern   Not on file  Social History Narrative   Married - current 3rd,    Two children from first marriage   He has 4 children from previous marriage   Social Drivers of Corporate investment banker Strain: Low Risk  (05/31/2023)   Overall  Financial Resource Strain (CARDIA)    Difficulty of Paying Living Expenses: Not hard at all  Food Insecurity: No Food Insecurity (05/31/2023)   Hunger Vital Sign    Worried About Running Out of Food in the Last Year: Never true    Ran Out of Food in the Last Year: Never true  Transportation Needs: No Transportation Needs (05/31/2023)   PRAPARE - Administrator, Civil Service (Medical): No    Lack of Transportation (Non-Medical): No  Physical Activity: Inactive (05/31/2023)   Exercise Vital Sign    Days of Exercise per Week: 0 days    Minutes of Exercise per Session: 0 min  Stress: No Stress Concern Present (05/31/2023)   [employer] of Occupational Health - Occupational Stress Questionnaire    Feeling of Stress : Not at all  Social Connections: Socially Integrated (05/31/2023)   Social Connection and Isolation Panel [NHANES]    Frequency of Communication with Friends and Family: More than three times a week    Frequency of Social Gatherings with Friends and Family: More than three times a week    Attends Religious Services: More than 4 times per year    Active Member of [employer] or Organizations: Yes    Attends Engineer, structural: More than 4 times per year    Marital Status: Married  Catering manager Violence: Not At Risk (05/31/2023)   Humiliation, Afraid, Rape, and Kick questionnaire    Fear of Current or Ex-Partner: No    Emotionally Abused: No    Physically Abused: No    Sexually Abused: No    Subjective: Review of Systems  Constitutional:  Negative for chills and fever.  HENT:  Negative for congestion and hearing loss.   Eyes:  Negative for blurred vision  and double vision.  Respiratory:  Negative for cough and shortness of breath.   Cardiovascular:  Negative for chest pain and palpitations.  Gastrointestinal:  Negative for abdominal pain, blood in stool, constipation, diarrhea, heartburn, melena and vomiting.       Fecal smearing  Genitourinary:   Negative for dysuria and urgency.  Musculoskeletal:  Negative for joint pain and myalgias.  Skin:  Negative for itching and rash.  Neurological:  Negative for dizziness and headaches.  Psychiatric/Behavioral:  Negative for depression. The patient is not nervous/anxious.        Objective: BP 123/74 (BP Location: Left Arm, Patient Position: Sitting, Cuff Size: Large)   Pulse 77   Temp (!) 97.1 F (36.2 C) (Temporal)   Ht 5' 5.5" (1.664 m)   Wt 209 lb 12.8 oz (95.2 kg)   BMI 34.38 kg/m  Physical Exam Constitutional:      Appearance: Normal appearance.  HENT:     Head: Normocephalic and atraumatic.  Eyes:     Extraocular Movements: Extraocular movements intact.     Conjunctiva/sclera: Conjunctivae normal.  Cardiovascular:     Rate and Rhythm: Normal rate and regular rhythm.  Pulmonary:     Effort: Pulmonary effort is normal.     Breath sounds: Normal breath sounds.  Abdominal:     General: Bowel sounds are normal.     Palpations: Abdomen is soft.  Musculoskeletal:        General: No swelling. Normal range of motion.     Cervical back: Normal range of motion and neck supple.  Skin:    General: Skin is warm and dry.     Coloration: Skin is not jaundiced.  Neurological:     General: No focal deficit present.     Mental Status: She is alert and oriented to person, place, and time.  Psychiatric:        Mood and Affect: Mood normal.        Behavior: Behavior normal.      Assessment/Plan:  1.  Fecal smearing/incontinence-recommend patient start Metamucil 1-2 times daily.  Pelvic floor exercises printed off for patient today.    Will schedule for colonoscopy to rule out intraluminal causes as well as polyp surveillance. The risks including infection, bleed, or perforation as well as benefits, limitations, alternatives and imponderables have been reviewed with the patient. Questions have been answered. All parties agreeable.  Internal hemorrhoids noted on prior colonoscopy,  may benefit from hemorrhoid banding, possibly playing a role in her smearing.  2.  Chronic GERD-well-controlled on pantoprazole .  Continue.  Does have history of ablation, likely need to update her EGD that we will hold off for now as patient is more interested in addressing her above symptoms.  3.  History of adenomatous colon polyps-colonoscopy as above  Thank you Dr. Ava Lei for the kind   01/23/2024 8:34 AM   Disclaimer: This note was dictated with voice recognition software. Similar sounding words can inadvertently be transcribed and may not be corrected upon review.

## 2024-01-23 NOTE — H&P (View-Only) (Signed)
 Primary Care Physician:  Sowles, Krichna, MD Primary Gastroenterologist:  Dr. Mordechai April  Chief Complaint  Patient presents with   New Patient (Initial Visit)    Patient here today due to having some issues with fecal incontinence. Symptoms ongoing for the last month. Patient denies any issues with constipation.     HPI:   Dana Sparks is a 73 y.o. female who presents to clinic today by referral from her PCP Dr. Ava Lei for evaluation.  Fecal smearing/incontinence: Reports the symptoms started approximately 1 to 2 months ago.  Noticed when she urinates she will have stool come out that she does not feel.  Also notes fecal smearing in her underwear at times.  Occasional bleeding if she "wipes too hard."  No rectal itching or pain.  Last colonoscopy 09/02/2018 unremarkable besides internal hemorrhoids.  She had tubular adenomas on colonoscopy 2015.  No family history of colorectal malignancy.  No unintentional weight loss.  Denies any abdominal pain.  Denies any issues with constipation.  States she has 1 normal bowel movement today, [place] stool chart 4.  Chronic GERD: Well-controlled on pantoprazole  40 mg daily.  No dysphagia odynophagia.  No epigastric or chest pain.  Past Medical History:  Diagnosis Date   Allergy    Anxiety    Arthritis    right knee   CAD (coronary artery disease)    COPD (chronic obstructive pulmonary disease) (HCC)    Depression    Diabetes mellitus without complication (HCC)    type 2   Diverticulitis    Gastroparesis    GERD (gastroesophageal reflux disease)    Gout    Hyperlipemia    Hypertension    Positive H. pylori test    Renal insufficiency    Sleep apnea    CPAP   Thrombocytosis 01/28/2015   Tubular adenoma of colon 01/20/14    Past Surgical History:  Procedure Laterality Date   ABDOMINAL HYSTERECTOMY     total   BREAST BIOPSY Right 06/07/2022   stereo bx, calcs, COIL clip-FIBROADENOMATOID CHANGE   CATARACT EXTRACTION     CATARACT  EXTRACTION W/PHACO Left 03/24/2019   Procedure: CATARACT EXTRACTION PHACO AND INTRAOCULAR LENS PLACEMENT (IOC)  LEFT DIABETIC;  Surgeon: Rosa College, MD;  Location: [facility];  Service: Ophthalmology;  Laterality: Left;  Diabetic - insulin  and oral meds sleep apnea   COLONOSCOPY  01/02/2014   sigmoid diverticulosis. desc colon TA, hyperplastic polyp   COLONOSCOPY WITH PROPOFOL  N/A 09/02/2018   Procedure: COLONOSCOPY WITH PROPOFOL ;  Surgeon: Marnee Sink, MD;  Location: [facility];  Service: Endoscopy;  Laterality: N/A;   CORONARY ANGIOPLASTY WITH STENT PLACEMENT     ESOPHAGOGASTRODUODENOSCOPY N/A 02/16/2015   negative h pylori, focal gastic intestinal metaplasia   ESOPHAGOGASTRODUODENOSCOPY (EGD) WITH PROPOFOL  N/A 09/02/2018   Procedure: ESOPHAGOGASTRODUODENOSCOPY (EGD) WITH BIOPSIES;  Surgeon: Marnee Sink, MD;  Location: [facility];  Service: Endoscopy;  Laterality: N/A;  diabetic -insulin  and oral meds sleep apnea   TOTAL KNEE ARTHROPLASTY Right     Current Outpatient Medications  Medication Sig Dispense Refill   ACCU-CHEK FASTCLIX LANCETS MISC      ACCU-CHEK SMARTVIEW test strip Check fsbs two times daily  DMII 100 each 6   Alcohol Swabs (B-D SINGLE USE SWABS REGULAR) PADS 1 each by Does not apply route 4 (four) times daily. 200 each 2   allopurinol  (ZYLOPRIM ) 100 MG tablet Take 1 tablet (100 mg total) by mouth daily. 90 tablet 1   amLODipine  (  NORVASC ) 2.5 MG tablet Take 1 tablet (2.5 mg total) by mouth daily. 90 tablet 1   aspirin  EC 81 MG tablet Take 81 mg by mouth every morning.     BD INSULIN  SYRINGE U/F 31G X 5/16" 0.3 ML MISC      blood glucose meter kit and supplies KIT Dispense based on patient and insurance preference. Use up to four times daily as directed. (FOR ICD-9 250.00, 250.01). 1 each 0   Cholecalciferol (VITAMIN D3) 50 MCG (2000 UT) TABS Take 2,000 Units by mouth daily.     cloNIDine  (CATAPRES ) 0.1 MG tablet Take 1 tablet (0.1 mg  total) by mouth every evening. 90 tablet 1   clotrimazole  (LOTRIMIN ) 1 % cream Apply 1 Application topically 2 (two) times daily. 113 g 1   Coenzyme Q10 (CO Q-10) 100 MG CAPS TAKE 1 CAPSULE EVERY DAY 90 capsule 3   Dulaglutide (TRULICITY) 1.5 MG/0.5ML SOPN Inject 1.5 mg into the skin once a week.     estradiol  (ESTRACE ) 0.5 MG tablet Take 1 tablet (0.5 mg total) by mouth daily. 90 tablet 1   Ferrous Sulfate  (IRON) 325 (65 Fe) MG TABS Take 325 mg by mouth daily. Nature made brand-     fluticasone -salmeterol (ADVAIR) 250-50 MCG/ACT AEPB Inhale 1 puff into the lungs in the morning and at bedtime. 180 each 1   ibuprofen (ADVIL) 800 MG tablet Take 800 mg by mouth every 6 (six) hours as needed.     Insulin  Glargine (BASAGLAR  KWIKPEN) 100 UNIT/ML SOPN Inject 60 Units into the skin at bedtime.     Insulin  Pen Needle (FIFTY50 PEN NEEDLES) 31G X 5 MM MISC Use once daily     ipratropium (ATROVENT ) 0.03 % nasal spray Place 2 sprays into both nostrils every 12 (twelve) hours. 90 mL 1   isosorbide  mononitrate (IMDUR ) 60 MG 24 hr tablet Take 1 tablet (60 mg total) by mouth daily. 90 tablet 1   loratadine  (CLARITIN ) 10 MG tablet Take 1 tablet (10 mg total) by mouth daily. 90 tablet 1   losartan  (COZAAR ) 50 MG tablet Take 1 tablet (50 mg total) by mouth daily. 90 tablet 1   metoCLOPramide  (REGLAN ) 5 MG tablet TAKE 1 TABLET TWICE DAILY 180 tablet 3   metoprolol  succinate (TOPROL -XL) 50 MG 24 hr tablet Take 1 tablet (50 mg total) by mouth daily. Take with or immediately following a meal. 90 tablet 1   montelukast  (SINGULAIR ) 10 MG tablet Take 1 tablet (10 mg total) by mouth every evening. 90 tablet 1   pantoprazole  (PROTONIX ) 40 MG tablet Take 1 tablet (40 mg total) by mouth every morning. 90 tablet 1   rosuvastatin  (CRESTOR ) 20 MG tablet TAKE 1 TABLET (20 MG TOTAL) BY MOUTH DAILY. 90 tablet 3   SYNJARDY XR 12.01-999 MG TB24 Take 2 tablets by mouth daily.     talc  powder Apply topically as needed. 240 g 0    venlafaxine  XR (EFFEXOR -XR) 75 MG 24 hr capsule Take 1 capsule (75 mg total) by mouth daily. 90 capsule 1   No current facility-administered medications for this visit.    Allergies as of 01/23/2024 - Review Complete 01/23/2024  Allergen Reaction Noted   Codeine Other (See Comments) 12/31/2014   Contrast media [iodinated contrast media] Itching 02/16/2015   Lisinopril  Cough 10/13/2020   Sulfa antibiotics Itching 02/16/2015    Family History  Problem Relation Age of Onset   Stroke Sister    Hyperlipidemia Sister    Alcohol abuse Brother  Diabetes Brother    Stroke Brother    Hypertension Mother    Diabetes Mother    Heart disease Mother    Diabetes Father    Heart disease Father    Hypertension Father    Hypertension Sister    Cancer Maternal Grandmother        Unsure   Diabetes Maternal Grandfather    Colon cancer Neg Hx    Liver disease Neg Hx    Breast cancer Neg Hx     Social History   Socioeconomic History   Marital status: Married    Spouse name: Hewitt Lou   Number of children: 2   Years of education: some college   Highest education level: 12th grade  Occupational History   Occupation: Disabled  Tobacco Use   Smoking status: Former    Current packs/day: 0.00    Average packs/day: 1 pack/day for 40.3 years (40.3 ttl pk-yrs)    Types: Cigarettes    Start date: 09/04/1972    Quit date: 12/30/2012    Years since quitting: 11.0   Smokeless tobacco: Never   Tobacco comments:    smoking cessation materials not required  Vaping Use   Vaping status: Never Used  Substance and Sexual Activity   Alcohol use: No    Alcohol/week: 0.0 standard drinks of alcohol   Drug use: No   Sexual activity: Not Currently  Other Topics Concern   Not on file  Social History Narrative   Married - current 3rd,    Two children from first marriage   He has 4 children from previous marriage   Social Drivers of Corporate investment banker Strain: Low Risk  (05/31/2023)   Overall  Financial Resource Strain (CARDIA)    Difficulty of Paying Living Expenses: Not hard at all  Food Insecurity: No Food Insecurity (05/31/2023)   Hunger Vital Sign    Worried About Running Out of Food in the Last Year: Never true    Ran Out of Food in the Last Year: Never true  Transportation Needs: No Transportation Needs (05/31/2023)   PRAPARE - Administrator, Civil Service (Medical): No    Lack of Transportation (Non-Medical): No  Physical Activity: Inactive (05/31/2023)   Exercise Vital Sign    Days of Exercise per Week: 0 days    Minutes of Exercise per Session: 0 min  Stress: No Stress Concern Present (05/31/2023)   [employer] of Occupational Health - Occupational Stress Questionnaire    Feeling of Stress : Not at all  Social Connections: Socially Integrated (05/31/2023)   Social Connection and Isolation Panel [NHANES]    Frequency of Communication with Friends and Family: More than three times a week    Frequency of Social Gatherings with Friends and Family: More than three times a week    Attends Religious Services: More than 4 times per year    Active Member of [employer] or Organizations: Yes    Attends Engineer, structural: More than 4 times per year    Marital Status: Married  Catering manager Violence: Not At Risk (05/31/2023)   Humiliation, Afraid, Rape, and Kick questionnaire    Fear of Current or Ex-Partner: No    Emotionally Abused: No    Physically Abused: No    Sexually Abused: No    Subjective: Review of Systems  Constitutional:  Negative for chills and fever.  HENT:  Negative for congestion and hearing loss.   Eyes:  Negative for blurred vision  and double vision.  Respiratory:  Negative for cough and shortness of breath.   Cardiovascular:  Negative for chest pain and palpitations.  Gastrointestinal:  Negative for abdominal pain, blood in stool, constipation, diarrhea, heartburn, melena and vomiting.       Fecal smearing  Genitourinary:   Negative for dysuria and urgency.  Musculoskeletal:  Negative for joint pain and myalgias.  Skin:  Negative for itching and rash.  Neurological:  Negative for dizziness and headaches.  Psychiatric/Behavioral:  Negative for depression. The patient is not nervous/anxious.        Objective: BP 123/74 (BP Location: Left Arm, Patient Position: Sitting, Cuff Size: Large)   Pulse 77   Temp (!) 97.1 F (36.2 C) (Temporal)   Ht 5' 5.5" (1.664 m)   Wt 209 lb 12.8 oz (95.2 kg)   BMI 34.38 kg/m  Physical Exam Constitutional:      Appearance: Normal appearance.  HENT:     Head: Normocephalic and atraumatic.  Eyes:     Extraocular Movements: Extraocular movements intact.     Conjunctiva/sclera: Conjunctivae normal.  Cardiovascular:     Rate and Rhythm: Normal rate and regular rhythm.  Pulmonary:     Effort: Pulmonary effort is normal.     Breath sounds: Normal breath sounds.  Abdominal:     General: Bowel sounds are normal.     Palpations: Abdomen is soft.  Musculoskeletal:        General: No swelling. Normal range of motion.     Cervical back: Normal range of motion and neck supple.  Skin:    General: Skin is warm and dry.     Coloration: Skin is not jaundiced.  Neurological:     General: No focal deficit present.     Mental Status: She is alert and oriented to person, place, and time.  Psychiatric:        Mood and Affect: Mood normal.        Behavior: Behavior normal.      Assessment/Plan:  1.  Fecal smearing/incontinence-recommend patient start Metamucil 1-2 times daily.  Pelvic floor exercises printed off for patient today.    Will schedule for colonoscopy to rule out intraluminal causes as well as polyp surveillance. The risks including infection, bleed, or perforation as well as benefits, limitations, alternatives and imponderables have been reviewed with the patient. Questions have been answered. All parties agreeable.  Internal hemorrhoids noted on prior colonoscopy,  may benefit from hemorrhoid banding, possibly playing a role in her smearing.  2.  Chronic GERD-well-controlled on pantoprazole .  Continue.  Does have history of ablation, likely need to update her EGD that we will hold off for now as patient is more interested in addressing her above symptoms.  3.  History of adenomatous colon polyps-colonoscopy as above  Thank you Dr. Ava Lei for the kind   01/23/2024 8:34 AM   Disclaimer: This note was dictated with voice recognition software. Similar sounding words can inadvertently be transcribed and may not be corrected upon review.

## 2024-01-23 NOTE — Patient Instructions (Addendum)
 We will schedule you for colonoscopy to further evaluate your symptoms.  I want you to start taking Metamucil 1-2 times daily.  Also want you to start Kegel exercises at home.  I printed these off for you today.  You may benefit from hemorrhoid banding depending on colonoscopy findings.  Continue on pantoprazole  for your chronic reflux.  It was very nice meeting both you today.  Dr. Mordechai April

## 2024-01-24 ENCOUNTER — Encounter (HOSPITAL_COMMUNITY)
Admission: RE | Admit: 2024-01-24 | Discharge: 2024-01-24 | Disposition: A | Discharge status: Home / Self Care | Source: Ambulatory Visit | Attending: Internal Medicine | Admitting: Internal Medicine

## 2024-01-25 ENCOUNTER — Ambulatory Visit (HOSPITAL_COMMUNITY)

## 2024-01-25 ENCOUNTER — Encounter (HOSPITAL_COMMUNITY): Payer: Self-pay

## 2024-01-25 ENCOUNTER — Ambulatory Visit (HOSPITAL_COMMUNITY)
Admission: RE | Admit: 2024-01-25 | Discharge: 2024-01-25 | Disposition: A | Discharge status: Home / Self Care | Attending: Internal Medicine | Admitting: Internal Medicine

## 2024-01-25 ENCOUNTER — Encounter (HOSPITAL_COMMUNITY)
Admission: RE | Disposition: A | Discharge status: Home / Self Care | Payer: Self-pay | Source: Home / Self Care | Attending: Internal Medicine

## 2024-01-25 DIAGNOSIS — Z1211 Encounter for screening for malignant neoplasm of colon: Secondary | ICD-10-CM

## 2024-01-25 DIAGNOSIS — G4730 Sleep apnea, unspecified: Secondary | ICD-10-CM | POA: Insufficient documentation

## 2024-01-25 DIAGNOSIS — E66813 Obesity, class 3: Secondary | ICD-10-CM | POA: Diagnosis not present

## 2024-01-25 DIAGNOSIS — K648 Other hemorrhoids: Secondary | ICD-10-CM | POA: Insufficient documentation

## 2024-01-25 DIAGNOSIS — D649 Anemia, unspecified: Secondary | ICD-10-CM | POA: Diagnosis not present

## 2024-01-25 DIAGNOSIS — Q438 Other specified congenital malformations of intestine: Secondary | ICD-10-CM | POA: Diagnosis not present

## 2024-01-25 DIAGNOSIS — K5730 Diverticulosis of large intestine without perforation or abscess without bleeding: Secondary | ICD-10-CM | POA: Insufficient documentation

## 2024-01-25 DIAGNOSIS — F32A Depression, unspecified: Secondary | ICD-10-CM | POA: Insufficient documentation

## 2024-01-25 DIAGNOSIS — D123 Benign neoplasm of transverse colon: Secondary | ICD-10-CM

## 2024-01-25 DIAGNOSIS — R151 Fecal smearing: Secondary | ICD-10-CM | POA: Diagnosis not present

## 2024-01-25 DIAGNOSIS — Z794 Long term (current) use of insulin: Secondary | ICD-10-CM | POA: Diagnosis not present

## 2024-01-25 DIAGNOSIS — Z87891 Personal history of nicotine dependence: Secondary | ICD-10-CM | POA: Diagnosis not present

## 2024-01-25 DIAGNOSIS — D759 Disease of blood and blood-forming organs, unspecified: Secondary | ICD-10-CM | POA: Diagnosis not present

## 2024-01-25 DIAGNOSIS — I25119 Atherosclerotic heart disease of native coronary artery with unspecified angina pectoris: Secondary | ICD-10-CM | POA: Diagnosis not present

## 2024-01-25 DIAGNOSIS — K635 Polyp of colon: Secondary | ICD-10-CM

## 2024-01-25 DIAGNOSIS — Z6834 Body mass index (BMI) 34.0-34.9, adult: Secondary | ICD-10-CM | POA: Insufficient documentation

## 2024-01-25 DIAGNOSIS — Z7984 Long term (current) use of oral hypoglycemic drugs: Secondary | ICD-10-CM | POA: Diagnosis not present

## 2024-01-25 DIAGNOSIS — Z7985 Long-term (current) use of injectable non-insulin antidiabetic drugs: Secondary | ICD-10-CM | POA: Diagnosis not present

## 2024-01-25 DIAGNOSIS — J449 Chronic obstructive pulmonary disease, unspecified: Secondary | ICD-10-CM | POA: Insufficient documentation

## 2024-01-25 DIAGNOSIS — E1122 Type 2 diabetes mellitus with diabetic chronic kidney disease: Secondary | ICD-10-CM | POA: Insufficient documentation

## 2024-01-25 DIAGNOSIS — K219 Gastro-esophageal reflux disease without esophagitis: Secondary | ICD-10-CM | POA: Insufficient documentation

## 2024-01-25 DIAGNOSIS — N183 Chronic kidney disease, stage 3 unspecified: Secondary | ICD-10-CM | POA: Diagnosis not present

## 2024-01-25 DIAGNOSIS — I129 Hypertensive chronic kidney disease with stage 1 through stage 4 chronic kidney disease, or unspecified chronic kidney disease: Secondary | ICD-10-CM | POA: Diagnosis not present

## 2024-01-25 DIAGNOSIS — F419 Anxiety disorder, unspecified: Secondary | ICD-10-CM | POA: Insufficient documentation

## 2024-01-25 HISTORY — PX: COLONOSCOPY: SHX5424

## 2024-01-25 LAB — GLUCOSE, CAPILLARY: Glucose-Capillary: 81 mg/dL (ref 70–99)

## 2024-01-25 SURGERY — COLONOSCOPY
Anesthesia: General

## 2024-01-25 MED ORDER — LACTATED RINGERS IV SOLN: INTRAVENOUS | Status: DC

## 2024-01-25 MED ORDER — STERILE WATER FOR IRRIGATION IR SOLN
Status: DC | PRN
  Administered 2024-01-25: 120 mL

## 2024-01-25 MED ORDER — PROPOFOL 500 MG/50ML IV EMUL
INTRAVENOUS | Status: DC | PRN
  Administered 2024-01-25: 100 ug/kg/min via INTRAVENOUS
  Administered 2024-01-25: 40 mg via INTRAVENOUS
  Administered 2024-01-25: 20 mg via INTRAVENOUS

## 2024-01-25 MED ORDER — PHENYLEPHRINE 80 MCG/ML (10ML) SYRINGE FOR IV PUSH (FOR BLOOD PRESSURE SUPPORT)
PREFILLED_SYRINGE | INTRAVENOUS | Status: DC | PRN
  Administered 2024-01-25: 80 ug via INTRAVENOUS
  Administered 2024-01-25: 160 ug via INTRAVENOUS
  Administered 2024-01-25: 80 ug via INTRAVENOUS

## 2024-01-25 MED ORDER — LIDOCAINE 2% (20 MG/ML) 5 ML SYRINGE
INTRAMUSCULAR | Status: DC | PRN
  Administered 2024-01-25: 60 mg via INTRAVENOUS

## 2024-01-25 NOTE — Discharge Instructions (Signed)
  Colonoscopy Discharge Instructions  Read the instructions outlined below and refer to this sheet in the next few weeks. These discharge instructions provide you with general information on caring for yourself after you leave the hospital. Your doctor may also give you specific instructions. While your treatment has been planned according to the most current medical practices available, unavoidable complications occasionally occur.   ACTIVITY You may resume your regular activity, but move at a slower pace for the next 24 hours.  Take frequent rest periods for the next 24 hours.  Walking will help get rid of the air and reduce the bloated feeling in your belly (abdomen).  No driving for 24 hours (because of the medicine (anesthesia) used during the test).   Do not sign any important legal documents or operate any machinery for 24 hours (because of the anesthesia used during the test).  NUTRITION Drink plenty of fluids.  You may resume your normal diet as instructed by your doctor.  Begin with a light meal and progress to your normal diet. Heavy or fried foods are harder to digest and may make you feel sick to your stomach (nauseated).  Avoid alcoholic beverages for 24 hours or as instructed.  MEDICATIONS You may resume your normal medications unless your doctor tells you otherwise.  WHAT YOU CAN EXPECT TODAY Some feelings of bloating in the abdomen.  Passage of more gas than usual.  Spotting of blood in your stool or on the toilet paper.  IF YOU HAD POLYPS REMOVED DURING THE COLONOSCOPY: No aspirin  products for 7 days or as instructed.  No alcohol for 7 days or as instructed.  Eat a soft diet for the next 24 hours.  FINDING OUT THE RESULTS OF YOUR TEST Not all test results are available during your visit. If your test results are not back during the visit, make an appointment with your caregiver to find out the results. Do not assume everything is normal if you have not heard from your  caregiver or the medical facility. It is important for you to follow up on all of your test results.  SEEK IMMEDIATE MEDICAL ATTENTION IF: You have more than a spotting of blood in your stool.  Your belly is swollen (abdominal distention).  You are nauseated or vomiting.  You have a temperature over 101.  You have abdominal pain or discomfort that is severe or gets worse throughout the day.   Your colonoscopy revealed 2 polyp(s) which I removed successfully. Await pathology results, my office will contact you. I recommend repeating colonoscopy in 7 years for surveillance purposes.   You also have diverticulosis and internal hemorrhoids. I would recommend taking OTC Metamucil twice daily. Be sure to drink at least 4 to 6 glasses of water  daily.   We will set you up for hemorrhoid banding in the office.    I hope you have a great rest of your week!  Rolando Cliche. Mordechai April, D.O. Gastroenterology and Hepatology [facility]

## 2024-01-25 NOTE — Transfer of Care (Signed)
 Immediate Anesthesia Transfer of Care Note  Patient: Dana Sparks  Procedure(s) Performed: COLONOSCOPY  Patient Location: PACU and Endoscopy Unit  Anesthesia Type:MAC  Level of Consciousness: awake and alert   Airway & Oxygen Therapy: Patient Spontanous Breathing and Patient connected to nasal cannula oxygen  Post-op Assessment: Report given to RN and Post -op Vital signs reviewed and stable  Post vital signs: Reviewed and stable  Last Vitals:  Vitals Value Taken Time  BP    Temp    Pulse    Resp    SpO2      Last Pain:  Vitals:   01/25/24 0906  TempSrc:   PainSc: 0-No pain      Patients Stated Pain Goal: 6 (01/25/24 0749)  Complications: No notable events documented.

## 2024-01-25 NOTE — Anesthesia Postprocedure Evaluation (Signed)
 Anesthesia Post Note  Patient: Dana Sparks  Procedure(s) Performed: COLONOSCOPY  Patient location during evaluation: PACU Anesthesia Type: General Level of consciousness: awake and alert Pain management: pain level controlled Vital Signs Assessment: post-procedure vital signs reviewed and stable Respiratory status: spontaneous breathing, nonlabored ventilation, respiratory function stable and patient connected to nasal cannula oxygen Cardiovascular status: blood pressure returned to baseline and stable Postop Assessment: no apparent nausea or vomiting Anesthetic complications: no  No notable events documented.   Last Vitals:  Vitals:   01/25/24 0934 01/25/24 0940  BP: (!) 89/55 (!) 118/51  Pulse: 72   Resp:    Temp: 36.5 C   SpO2: 96%     Last Pain:  Vitals:   01/25/24 0934  TempSrc: Oral  PainSc: 0-No pain                 Beacher Limerick

## 2024-01-25 NOTE — Op Note (Signed)
 [facility] Patient Name: Dana Sparks Procedure Date: 01/25/2024 8:49 AM MRN: [id number] Date of Birth: 04-03-1951 Attending MD: Rolando Cliche. Mordechai April , [place], [id number] CSN: [id number] Age: 73 Admit Type: Outpatient Procedure:                Colonoscopy Indications:              Surveillance: Personal history of adenomatous                            polyps on last colonoscopy > 5 years ago Providers:                Rolando Cliche. Mordechai April, DO, Willena Harp, Sharlette Dayhoff Technician, Technician Referring MD:              Medicines:                See the Anesthesia note for documentation of the                            administered medications Complications:            No immediate complications. Estimated Blood Loss:     Estimated blood loss was minimal. Procedure:                Pre-Anesthesia Assessment:                           - The anesthesia plan was to use monitored                            anesthesia care (MAC).                           After obtaining informed consent, the colonoscope                            was passed under direct vision. Throughout the                            procedure, the patient's blood pressure, pulse, and                            oxygen saturations were monitored continuously. The                            [id number]-[id number] ([id number]) scope was introduced through                            the anus and advanced to the the cecum, identified                            by appendiceal orifice and ileocecal valve. The                            colonoscopy was somewhat  difficult due to a                            redundant colon and significant looping. Successful                            completion of the procedure was aided by applying                            abdominal pressure. The patient tolerated the                            procedure well. The quality of the bowel                            preparation was  evaluated using the BBPS [facility]                            Bowel Preparation Scale) with scores of: Right                            Colon = 2 (minor amount of residual staining, small                            fragments of stool and/or opaque liquid, but mucosa                            seen well), Transverse Colon = 2 (minor amount of                            residual staining, small fragments of stool and/or                            opaque liquid, but mucosa seen well) and Left Colon                            = 2 (minor amount of residual staining, small                            fragments of stool and/or opaque liquid, but mucosa                            seen well). The total BBPS score equals 6. The                            quality of the bowel preparation was good. Scope In: 9:11:53 AM Scope Out: 9:28:49 AM Scope Withdrawal Time: 0 hours 12 minutes 48 seconds  Total Procedure Duration: 0 hours 16 minutes 56 seconds  Findings:      Non-bleeding internal hemorrhoids were found during retroflexion.      Many medium-mouthed and small-mouthed diverticula were found in the       sigmoid colon, descending colon and transverse colon.      Two sessile  polyps were found in the transverse colon. The polyps were 2       to 4 mm in size. These polyps were removed with a cold snare. Resection       and retrieval were complete.      The exam was otherwise without abnormality. Impression:               - Non-bleeding internal hemorrhoids.                           - Diverticulosis in the sigmoid colon, in the                            descending colon and in the transverse colon.                           - Two 2 to 4 mm polyps in the transverse colon,                            removed with a cold snare. Resected and retrieved.                           - The examination was otherwise normal. Moderate Sedation:      Per Anesthesia Care Recommendation:           - Patient has a  contact number available for                            emergencies. The signs and symptoms of potential                            delayed complications were discussed with the                            patient. Return to normal activities tomorrow.                            Written discharge instructions were provided to the                            patient.                           - Resume previous diet.                           - Continue present medications.                           - Await pathology results.                           - Repeat colonoscopy in 7 years for surveillance.                           - Use original regular Metamucil one tablespoon PO  BID.                           - Will schedule for hemorrhoid banding next                            avaiable with Karna Pacas Procedure Code(s):        --- Professional ---                           [phone], Colonoscopy, flexible; with removal of                            tumor(s), polyp(s), or other lesion(s) by snare                            technique Diagnosis Code(s):        --- Professional ---                           Z86.010, Personal history of colonic polyps                           K64.8, Other hemorrhoids                           D12.3, Benign neoplasm of transverse colon (hepatic                            flexure or splenic flexure)                           K57.30, Diverticulosis of large intestine without                            perforation or abscess without bleeding CPT copyright 2022 American Medical Association. All rights reserved. The codes documented in this report are preliminary and upon coder review may  be revised to meet current compliance requirements. Rolando Cliche. Mordechai April, DO Rolando Cliche. Mordechai April, DO 01/25/2024 9:36:22 AM This report has been signed electronically. Number of Addenda: 0

## 2024-01-25 NOTE — Interval H&P Note (Signed)
 History and Physical Interval Note:  01/25/2024 8:51 AM  Lizbeth Right  has presented today for surgery, with the diagnosis of hx polyps, fecal smearing.  The various methods of treatment have been discussed with the patient and family. After consideration of risks, benefits and other options for treatment, the patient has consented to  Procedure(s) with comments: COLONOSCOPY (N/A) - 915am, asa 2 as a surgical intervention.  The patient's history has been reviewed, patient examined, no change in status, stable for surgery.  I have reviewed the patient's chart and labs.  Questions were answered to the patient's satisfaction.     Vinetta Greening

## 2024-01-25 NOTE — Anesthesia Preprocedure Evaluation (Addendum)
 Anesthesia Evaluation  Patient identified by MRN, date of birth, ID band Patient awake    Reviewed: Allergy & Precautions, H&P , NPO status , Patient's Chart, lab work & pertinent test results  Airway Mallampati: II  TM Distance: >3 FB Neck ROM: Full    Dental no notable dental hx.    Pulmonary sleep apnea , COPD, former smoker   Pulmonary exam normal breath sounds clear to auscultation       Cardiovascular hypertension, + angina  + CAD  Normal cardiovascular exam Rhythm:Regular Rate:Normal  EF 55-60 2017   Neuro/Psych  PSYCHIATRIC DISORDERS Anxiety Depression    negative neurological ROS     GI/Hepatic Neg liver ROS,GERD  ,,  Endo/Other  diabetes  Class 3 obesity  Renal/GU Renal InsufficiencyRenal diseaseStage 3 CKD  negative genitourinary   Musculoskeletal  (+) Arthritis ,    Abdominal  (+) + obese  Peds negative pediatric ROS (+)  Hematology  (+) Blood dyscrasia, anemia   Anesthesia Other Findings   Reproductive/Obstetrics negative OB ROS                             Anesthesia Physical Anesthesia Plan  ASA: 3  Anesthesia Plan: General   Post-op Pain Management:    Induction: Intravenous  PONV Risk Score and Plan: Propofol  infusion  Airway Management Planned: Nasal Cannula  Additional Equipment:   Intra-op Plan:   Post-operative Plan:   Informed Consent: I have reviewed the patients History and Physical, chart, labs and discussed the procedure including the risks, benefits and alternatives for the proposed anesthesia with the patient or authorized representative who has indicated his/her understanding and acceptance.     Dental advisory given  Plan Discussed with: CRNA  Anesthesia Plan Comments:        Anesthesia Quick Evaluation

## 2024-01-29 ENCOUNTER — Encounter (HOSPITAL_COMMUNITY): Payer: Self-pay | Admitting: Internal Medicine

## 2024-01-29 LAB — SURGICAL PATHOLOGY

## 2024-02-07 ENCOUNTER — Ambulatory Visit (INDEPENDENT_AMBULATORY_CARE_PROVIDER_SITE_OTHER): Admitting: Gastroenterology

## 2024-02-07 ENCOUNTER — Encounter: Payer: Self-pay | Admitting: Gastroenterology

## 2024-02-07 VITALS — BP 115/77 | HR 76 | Temp 98.8°F | Ht 65.5 in | Wt 209.2 lb

## 2024-02-07 DIAGNOSIS — K641 Second degree hemorrhoids: Secondary | ICD-10-CM | POA: Insufficient documentation

## 2024-02-07 NOTE — Patient Instructions (Signed)
  Please avoid straining.  You should limit your toilet time to 2-3 minutes at the most.   I recommend Benefiber 2 teaspoons each morning in the beverage of your choice!  Please call me with any concerns or issues!  I will see you in follow-up for additional banding in several weeks.     It was a pleasure to see you today. I want to create trusting relationships with patients and provide genuine, compassionate, and quality care. I truly value your feedback, so please be on the lookout for a survey regarding your visit with me today. I appreciate your time in completing this!    Annitta Needs, PhD, ANP-BC [facility]

## 2024-02-07 NOTE — Progress Notes (Signed)
      [facility] BANDING PROCEDURE NOTE  Dana Sparks is a 73 y.o. female presenting today for consideration of hemorrhoid banding. Last colonoscopy May 2025: non-bleeding internal hemorrhoids, diverticulosis, two 2-4 mm polyps in transverse colon. 7 year surveillance. Tubular adenoma and hyperplastic polyp. She has had fecal seepage/soiling. No bleeding or pain.    The patient presents with symptomatic grade 2 hemorrhoids, unresponsive to maximal medical therapy, requesting rubber band ligation of her hemorrhoidal disease. All risks, benefits, and alternative forms of therapy were described and informed consent was obtained.   The decision was made to band the left lateral internal hemorrhoid, and the [facility] O'Regan System was used to perform band ligation without complication. Digital anorectal examination was then performed to assure proper positioning of the band, and to adjust the banded tissue as required. The patient was discharged home without pain or other issues. Dietary and behavioral recommendations were given, along with follow-up instructions. The patient will return in several weeks for followup and possible additional banding as required. Will try to do remaining columns at that time.   No complications were encountered and the patient tolerated the procedure well.   Delman Ferns, PhD, ANP-BC [facility]

## 2024-02-08 ENCOUNTER — Ambulatory Visit: Payer: Self-pay | Admitting: Internal Medicine

## 2024-02-21 ENCOUNTER — Ambulatory Visit (INDEPENDENT_AMBULATORY_CARE_PROVIDER_SITE_OTHER): Admitting: Gastroenterology

## 2024-02-21 ENCOUNTER — Encounter: Payer: Self-pay | Admitting: Gastroenterology

## 2024-02-21 VITALS — BP 105/68 | HR 82 | Temp 98.6°F | Ht 65.5 in | Wt 209.2 lb

## 2024-02-21 DIAGNOSIS — K641 Second degree hemorrhoids: Secondary | ICD-10-CM | POA: Diagnosis not present

## 2024-02-21 NOTE — Progress Notes (Signed)
    [facility] BANDING PROCEDURE NOTE  ANNIKA SELKE is a 73 y.o. female presenting today for consideration of hemorrhoid banding. Last colonoscopy May 2025: non-bleeding internal hemorrhoids, diverticulosis, two 2-4 mm polyps in transverse colon. 7 year surveillance. Tubular adenoma and hyperplastic polyp. She has had fecal seepage/soiling. First banding of left lateral at last visit.    The patient presents with symptomatic grade 2 hemorrhoids, unresponsive to maximal medical therapy, requesting rubber band ligation of his/her hemorrhoidal disease. All risks, benefits, and alternative forms of therapy were described and informed consent was obtained.  The decision was made to band the right posterior  internal hemorrhoid, and the [facility] O'Regan System was deployed but did not capture enough tissue. The decision was then made to band neutrally, and the [facility] banding system was used to perform band ligation without complication. Digital anorectal examination was then performed to assure proper positioning of the band, and to adjust the banded tissue as required. I then paid attention to the right posterior again and came more distally, with good capture of tissue.  The patient was discharged home without pain or other issues. Dietary and behavioral recommendations were given, along with follow-up instructions. The patient will return in several weeks for followup banding as needed.   No complications were encountered and the patient tolerated the procedure well.   Delman Ferns, PhD, ANP-BC [facility]

## 2024-02-21 NOTE — Patient Instructions (Signed)
  Please avoid straining.  You should limit your toilet time to 2-3 minutes at the most.   I recommend Benefiber 2 teaspoons each morning in the beverage of your choice!  Please call me with any concerns or issues!  I will see you in follow-up for banding as needed!  I enjoyed seeing you again today! I value our relationship and want to provide genuine, compassionate, and quality care. You may receive a survey regarding your visit with me, and I welcome your feedback! Thanks so much for taking the time to complete this. I look forward to seeing you again.      Delman Ferns, PhD, ANP-BC [facility]

## 2024-04-04 ENCOUNTER — Ambulatory Visit: Payer: Self-pay

## 2024-04-04 NOTE — Telephone Encounter (Signed)
  FYI Only or Action Required?: Action required by provider: update on patient condition.  Patient was last seen in primary care on 01/15/2024 by Glenard Mire, MD.  Called Nurse Triage reporting Abdominal Pain.  Symptoms began a week ago.  Interventions attempted: OTC medications: laxative, ibuprofen and Prescription medications: muscle relaxers.  Symptoms are: unchanged.  Triage Disposition: Go to ED Now (Notify PCP)  Patient/caregiver understands and will follow disposition?: Yes  Copied from CRM [id number]. Topic: Clinical - Pink Word Triage >> Apr 03, 2024  1:45 PM Rosaria E wrote: Reason for Triage: pt has a nagging side pain, 5 days. No appt soon enough  Please advise: [phone]  ----------------------------------------------------------------------- From previous Reason for Contact - Scheduling: Patient/patient representative is calling to schedule an appointment. Refer to attachments for appointment information.    ----------------------------------------------------------------------- From previous Reason for Contact - Red Word Triage: Red Word that prompted transfer to Nurse Triage: Nagging side pain for 5 days. Reason for Disposition  Black or tarry bowel movements  (Exception: Chronic-unchanged black-grey BMs AND is taking iron pills or Pepto-Bismol.)  Answer Assessment - Initial Assessment Questions 1. LOCATION: Where does it hurt?      Left sided abdominal pain 2. RADIATION: Does the pain shoot anywhere else? (e.g., chest, back)     States that pain shoots down lower 3. ONSET: When did the pain begin? (e.g., minutes, hours or days ago)      Began five days ago 4. SUDDEN: Gradual or sudden onset?     Sudden, will not go away 5. PATTERN Does the pain come and go, or is it constant?     constant 6. SEVERITY: How bad is the pain?  (e.g., Scale 1-10; mild, moderate, or severe)     5/10 7. RECURRENT SYMPTOM: Have you ever had this type of stomach  pain before? If Yes, ask: When was the last time? and What happened that time?      denies 8. CAUSE: What do you think is causing the stomach pain? (e.g., gallstones, recent abdominal surgery)     unknown 9. RELIEVING/AGGRAVATING FACTORS: What makes it better or worse? (e.g., antacids, bending or twisting motion, bowel movement)     No medications that she has taken have relieved the pain including ibuprofen, muscle relaxers, and nerve pain pill.  Nothing has worsened pain.  Also took laxitive because the thought it might be gas 10. OTHER SYMPTOMS: Do you have any other symptoms? (e.g., back pain, diarrhea, fever, urination pain, vomiting)       Black BM after taking laxitive 11. PREGNANCY: Is there any chance you are pregnant? When was your last menstrual period?       N/A  Protocols used: Abdominal Pain - Female-A-AH

## 2024-04-08 ENCOUNTER — Ambulatory Visit (INDEPENDENT_AMBULATORY_CARE_PROVIDER_SITE_OTHER): Admitting: Internal Medicine

## 2024-04-08 ENCOUNTER — Other Ambulatory Visit: Payer: Self-pay

## 2024-04-08 ENCOUNTER — Encounter: Payer: Self-pay | Admitting: Internal Medicine

## 2024-04-08 VITALS — BP 126/84 | HR 91 | Temp 97.9°F | Resp 16 | Ht 65.5 in | Wt 206.9 lb

## 2024-04-08 DIAGNOSIS — R1032 Left lower quadrant pain: Secondary | ICD-10-CM

## 2024-04-08 DIAGNOSIS — R109 Unspecified abdominal pain: Secondary | ICD-10-CM

## 2024-04-08 LAB — POCT URINALYSIS DIPSTICK
Bilirubin, UA: NEGATIVE
Blood, UA: NEGATIVE
Glucose, UA: NEGATIVE
Ketones, UA: NEGATIVE
Leukocytes, UA: NEGATIVE
Nitrite, UA: NEGATIVE
Protein, UA: NEGATIVE
Spec Grav, UA: 1.015 (ref 1.010–1.025)
Urobilinogen, UA: 0.2 U/dL
pH, UA: 5 (ref 5.0–8.0)

## 2024-04-08 NOTE — Progress Notes (Signed)
 Acute Office Visit  Subjective:     Patient ID: Dana Sparks, female    DOB: [birth year], 73 y.o.   MRN: [id number]  Chief Complaint  Patient presents with   Flank Pain    Left side pain for 10 days    Flank Pain Associated symptoms include abdominal pain. Pertinent negatives include no dysuria or fever.   Patient is in today for left side pain. She is a patient of Dr. Glenard and this is my first time meeting her.   Discussed the use of AI scribe software for clinical note transcription with the patient, who gave verbal consent to proceed.  History of Present Illness Dana Sparks is a 73 year old female who presents with localized abdominal pain radiating to the back.  She has experienced this pain for ten days, which worsens when lying on a specific side and is not associated with eating or bowel movements. The pain radiates around her abdomen without fever or additional pain elsewhere.  She has tried laxatives, ibuprofen, Tylenol , and muscle relaxers without relief. Tylenol  provides temporary relief, but the pain returns after it wears off. No recent changes in medications.  Bowel movements have been somewhat regular, with the last one nearly a week ago. Milk of Magnesia turns her stool dark, but there is no blood. A urine test shows no blood, infection, or inflammation.   Review of Systems  Constitutional:  Negative for chills and fever.  Gastrointestinal:  Positive for abdominal pain and constipation. Negative for blood in stool, diarrhea, melena, nausea and vomiting.  Genitourinary:  Positive for flank pain. Negative for dysuria, frequency and urgency.        Objective:    BP 126/84 (Cuff Size: Large)   Pulse 91   Temp 97.9 F (36.6 C) (Oral)   Resp 16   Ht 5' 5.5 (1.664 m)   Wt 206 lb 14.4 oz (93.8 kg)   BMI 33.91 kg/m  BP Readings from Last 3 Encounters:  04/08/24 126/84  02/21/24 105/68  02/07/24 115/77   Wt Readings from Last 3 Encounters:   04/08/24 206 lb 14.4 oz (93.8 kg)  02/21/24 209 lb 3.2 oz (94.9 kg)  02/07/24 209 lb 3.2 oz (94.9 kg)      Physical Exam Constitutional:      Appearance: Normal appearance.  HENT:     Head: Normocephalic and atraumatic.  Eyes:     Conjunctiva/sclera: Conjunctivae normal.  Cardiovascular:     Rate and Rhythm: Normal rate and regular rhythm.  Pulmonary:     Effort: Pulmonary effort is normal.     Breath sounds: Normal breath sounds.  Abdominal:     General: Bowel sounds are normal. There is no distension.     Palpations: Abdomen is soft.     Tenderness: There is abdominal tenderness. There is no right CVA tenderness, left CVA tenderness, guarding or rebound.     Comments: Localized severe tenderness to palpation in the LLQ but also mild pain in the RLQ and suprapubic area as well.  Skin:    General: Skin is warm and dry.  Neurological:     General: No focal deficit present.     Mental Status: She is alert. Mental status is at baseline.  Psychiatric:        Mood and Affect: Mood normal.        Behavior: Behavior normal.     No results found for any visits on 04/08/24.  Assessment & Plan:   Assessment & Plan Abdominal pain Pain localized to left side, radiating to back, persistent for ten days. Differential includes musculoskeletal pain, intestinal issues, and constipation. Urinalysis negative for urinary issues. Pain unrelieved by ibuprofen or muscle relaxers, but Tylenol  provides some relief. Recent colonoscopy with polyp removal unlikely related. - Order CT scan of the abdomen without contrast due to allergy to evaluate for intestinal or other abdominal issues. - UA here negative for blood, leukocytes or nitrates.  - Advise to increase water  intake. - Advise to monitor pain severity and go to the emergency room if pain becomes severe.  Constipation Constipation suspected due to lack of bowel movement since last Wednesday. Previous CT scan in 2019 showed stool  burden and kidney cysts. Regular bowel movements with stool softeners, but pain persists. Constipation could be contributing to abdominal pain. - Recommend over-the-counter MiraLAX  and stool softeners such as Colace or Senna. - Advise to take laxatives daily. - Advise to increase water  intake. - Monitor bowel movements and pain; if pain improves, CT scan can be canceled.  - POCT urinalysis dipstick - CT ABDOMEN PELVIS WO CONTRAST; Future    Return if symptoms worsen or fail to improve, for already scheduled.  Sharyle Fischer, DO

## 2024-04-14 ENCOUNTER — Telehealth: Payer: Self-pay

## 2024-04-14 NOTE — Telephone Encounter (Signed)
 The patient called back asking to speak with a nurse about her meter. I spoke with Cassandra and she had me transfer the patient for further assistance.

## 2024-04-14 NOTE — Telephone Encounter (Signed)
 Copied from CRM [id number]. Topic: Clinical - Prescription Issue >> Apr 14, 2024  3:32 PM Nathanel BROCKS wrote: Reason for CRM: Nano smart check  meter (pt has the test strips) per Sharene called from [facility] order pharmacy. Please do not send this request to [facility] she needs it to go to local pharmacy.  [id number] [place], [place] - [address]  Phone: [phone] Fax: [phone]   Please advise pt if there is an issue with this.

## 2024-04-15 ENCOUNTER — Ambulatory Visit
Admission: RE | Admit: 2024-04-15 | Discharge: 2024-04-15 | Disposition: A | Discharge status: Home / Self Care | Source: Ambulatory Visit | Attending: Internal Medicine | Admitting: Internal Medicine

## 2024-04-15 DIAGNOSIS — R109 Unspecified abdominal pain: Secondary | ICD-10-CM | POA: Insufficient documentation

## 2024-04-15 DIAGNOSIS — K5730 Diverticulosis of large intestine without perforation or abscess without bleeding: Secondary | ICD-10-CM | POA: Diagnosis not present

## 2024-04-15 DIAGNOSIS — K5900 Constipation, unspecified: Secondary | ICD-10-CM | POA: Diagnosis not present

## 2024-04-15 DIAGNOSIS — R1032 Left lower quadrant pain: Secondary | ICD-10-CM | POA: Insufficient documentation

## 2024-04-15 DIAGNOSIS — K429 Umbilical hernia without obstruction or gangrene: Secondary | ICD-10-CM | POA: Diagnosis not present

## 2024-04-15 NOTE — Telephone Encounter (Signed)
 Called patient back and informed her since she sees Dr.Solum, she will need to call there office and ask for a prescription since they are managing her DM.

## 2024-05-01 ENCOUNTER — Ambulatory Visit: Payer: Self-pay | Admitting: Internal Medicine

## 2024-05-01 DIAGNOSIS — N83201 Unspecified ovarian cyst, right side: Secondary | ICD-10-CM

## 2024-05-08 ENCOUNTER — Ambulatory Visit
Admission: RE | Admit: 2024-05-08 | Discharge: 2024-05-08 | Disposition: A | Discharge status: Home / Self Care | Source: Ambulatory Visit | Attending: Internal Medicine | Admitting: Internal Medicine

## 2024-05-08 DIAGNOSIS — N83201 Unspecified ovarian cyst, right side: Secondary | ICD-10-CM | POA: Diagnosis not present

## 2024-05-08 DIAGNOSIS — N83291 Other ovarian cyst, right side: Secondary | ICD-10-CM | POA: Diagnosis not present

## 2024-05-10 ENCOUNTER — Other Ambulatory Visit: Payer: Self-pay | Admitting: Family Medicine

## 2024-05-10 DIAGNOSIS — I209 Angina pectoris, unspecified: Secondary | ICD-10-CM

## 2024-05-10 DIAGNOSIS — F325 Major depressive disorder, single episode, in full remission: Secondary | ICD-10-CM

## 2024-05-10 DIAGNOSIS — N951 Menopausal and female climacteric states: Secondary | ICD-10-CM

## 2024-05-10 DIAGNOSIS — I10 Essential (primary) hypertension: Secondary | ICD-10-CM

## 2024-05-10 DIAGNOSIS — J3089 Other allergic rhinitis: Secondary | ICD-10-CM

## 2024-05-10 DIAGNOSIS — M109 Gout, unspecified: Secondary | ICD-10-CM

## 2024-05-10 DIAGNOSIS — I700 Atherosclerosis of aorta: Secondary | ICD-10-CM

## 2024-05-15 ENCOUNTER — Other Ambulatory Visit: Payer: Self-pay | Admitting: Family Medicine

## 2024-05-15 DIAGNOSIS — I10 Essential (primary) hypertension: Secondary | ICD-10-CM

## 2024-05-15 DIAGNOSIS — J3089 Other allergic rhinitis: Secondary | ICD-10-CM

## 2024-05-15 DIAGNOSIS — K219 Gastro-esophageal reflux disease without esophagitis: Secondary | ICD-10-CM

## 2024-05-19 ENCOUNTER — Other Ambulatory Visit: Payer: Self-pay

## 2024-05-19 ENCOUNTER — Ambulatory Visit: Payer: Self-pay | Admitting: Internal Medicine

## 2024-05-19 DIAGNOSIS — N83201 Unspecified ovarian cyst, right side: Secondary | ICD-10-CM

## 2024-05-22 ENCOUNTER — Encounter: Payer: Self-pay | Admitting: Family Medicine

## 2024-05-22 ENCOUNTER — Ambulatory Visit: Admitting: Family Medicine

## 2024-05-22 VITALS — BP 118/74 | HR 81 | Resp 16 | Ht 65.5 in | Wt 210.2 lb

## 2024-05-22 DIAGNOSIS — Z1231 Encounter for screening mammogram for malignant neoplasm of breast: Secondary | ICD-10-CM

## 2024-05-22 DIAGNOSIS — N83201 Unspecified ovarian cyst, right side: Secondary | ICD-10-CM | POA: Diagnosis not present

## 2024-05-22 DIAGNOSIS — N951 Menopausal and female climacteric states: Secondary | ICD-10-CM

## 2024-05-22 DIAGNOSIS — I209 Angina pectoris, unspecified: Secondary | ICD-10-CM

## 2024-05-22 DIAGNOSIS — Z23 Encounter for immunization: Secondary | ICD-10-CM | POA: Diagnosis not present

## 2024-05-22 DIAGNOSIS — K219 Gastro-esophageal reflux disease without esophagitis: Secondary | ICD-10-CM

## 2024-05-22 DIAGNOSIS — I272 Pulmonary hypertension, unspecified: Secondary | ICD-10-CM | POA: Diagnosis not present

## 2024-05-22 DIAGNOSIS — E669 Obesity, unspecified: Secondary | ICD-10-CM

## 2024-05-22 DIAGNOSIS — I152 Hypertension secondary to endocrine disorders: Secondary | ICD-10-CM

## 2024-05-22 DIAGNOSIS — E1159 Type 2 diabetes mellitus with other circulatory complications: Secondary | ICD-10-CM

## 2024-05-22 DIAGNOSIS — E1169 Type 2 diabetes mellitus with other specified complication: Secondary | ICD-10-CM | POA: Diagnosis not present

## 2024-05-22 DIAGNOSIS — I700 Atherosclerosis of aorta: Secondary | ICD-10-CM

## 2024-05-22 DIAGNOSIS — J3089 Other allergic rhinitis: Secondary | ICD-10-CM

## 2024-05-22 DIAGNOSIS — J432 Centrilobular emphysema: Secondary | ICD-10-CM

## 2024-05-22 MED ORDER — VENLAFAXINE HCL ER 75 MG PO CP24: 75.0000 mg | ORAL_CAPSULE | Freq: Every day | ORAL | 1 refills | Status: AC

## 2024-05-22 MED ORDER — ISOSORBIDE MONONITRATE ER 60 MG PO TB24: 60.0000 mg | ORAL_TABLET | Freq: Every day | ORAL | 1 refills | Status: AC

## 2024-05-22 MED ORDER — PANTOPRAZOLE SODIUM 40 MG PO TBEC
40.0000 mg | DELAYED_RELEASE_TABLET | Freq: Every morning | ORAL | 1 refills | Status: AC
Start: 2024-05-22 — End: ?

## 2024-05-22 MED ORDER — CLONIDINE HCL 0.1 MG PO TABS
0.1000 mg | ORAL_TABLET | Freq: Every evening | ORAL | 1 refills | Status: AC
Start: 2024-05-22 — End: ?

## 2024-05-22 MED ORDER — AMLODIPINE BESYLATE 2.5 MG PO TABS: 2.5000 mg | ORAL_TABLET | Freq: Every day | ORAL | 1 refills | Status: AC

## 2024-05-22 MED ORDER — ESTRADIOL 0.5 MG PO TABS: 0.5000 mg | ORAL_TABLET | Freq: Every day | ORAL | 1 refills | Status: AC

## 2024-05-22 MED ORDER — LOSARTAN POTASSIUM 50 MG PO TABS: 50.0000 mg | ORAL_TABLET | Freq: Every day | ORAL | 1 refills | Status: AC

## 2024-05-22 MED ORDER — METOPROLOL SUCCINATE ER 50 MG PO TB24: 50.0000 mg | ORAL_TABLET | Freq: Every day | ORAL | 1 refills | Status: AC

## 2024-05-22 MED ORDER — MONTELUKAST SODIUM 10 MG PO TABS
10.0000 mg | ORAL_TABLET | Freq: Every evening | ORAL | 1 refills | Status: AC
Start: 2024-05-22 — End: ?

## 2024-05-22 NOTE — Progress Notes (Signed)
 Name: Dana Sparks   MRN: [id number]    DOB: [birth year]   Date:05/22/2024       Progress Note  Subjective  Chief Complaint  Chief Complaint  Patient presents with   Medical Management of Chronic Issues   Discussed the use of AI scribe software for clinical note transcription with the patient, who gave verbal consent to proceed.  History of Present Illness Dana Sparks is a 73 year old female who presents for a regular follow-up and monitoring of an ovarian cyst.  She experiences pain on the left side, which led to a CT scan of the abdomen. The CT scan revealed a simple ovarian cyst on the right ovary. An ultrasound confirmed the presence of the cyst, and she is scheduled for a follow-up ultrasound in six months to monitor its size, as she is postmenopausal.  She underwent a colonoscopy in May due to a history of polyps. The procedure revealed non-bleeding internal hemorrhoids, diverticulosis, and two polyps, which were removed. The recommendation is to repeat the colonoscopy in seven years.  She has  type 2 diabetes, associated with obesity, hypertension, and dyslipidemia. Her diabetes is currently well-controlled with an A1c of 6.7% as of May. She takes Trulicity 1.5 mg, Basaglar  60 units, and Synjardy 12.01/999 mg twice a day. She monitors her blood sugar daily, reporting levels of 170 mg/dL at night and around 899 mg/dL in the morning. No symptoms of hypoglycemia. She has diabetic gastroparesis and takes metoclopramide  5 mg before her largest meal of the day  For hypertension, she takes losartan  50 mg, metoprolol  50 mg, clonidine  0.1 mg at night, and amlodipine  2.5 mg daily.  No dizziness, chest pain, or palpitations. She has a history of angina and takes Imdur  60 mg daily  for chest pain management.  She is on rosuvastatin  20 mg for dyslipidemia and reports no issues with compliance. She also takes pantoprazole  for reflux and venlafaxine  75 mg and Estrace  for menopausal symptoms,  which she finds necessary to manage her symptoms effectively.  No current symptoms of gout and last uric acid was normal   She takes montelukast  for year-round allergies, which are well-managed.  She has apulmonary hypertension and centrilobular emphysema. No current respiratory symptoms and does not use inhalers. She has tried a CPAP machine for sleep apnea but does not use it regularly.    Patient Active Problem List   Diagnosis Date Noted   Prolapsed internal hemorrhoids, grade 2 02/07/2024   Major depression in remission (HCC) 05/22/2022   Controlled type 2 diabetes mellitus with complication, with long-term current use of insulin  (HCC) 05/22/2022   Dyslipidemia associated with type 2 diabetes mellitus (HCC) 05/22/2022   Centrilobular emphysema (HCC) 07/09/2020   Hypercalcemia 07/05/2020   Simple chronic bronchitis (HCC) 02/22/2020   Hx of colonic polyps    Hepatomegaly 07/23/2018   Fatty liver 07/23/2018   Atherosclerosis of abdominal aorta (HCC) 07/09/2018   Diabetes mellitus type 2, controlled, with complications (HCC) 09/03/2017   Nasal polyp 09/03/2017   Lipoma of back 09/03/2017   Claudication (HCC) 06/04/2017   LVH (left ventricular hypertrophy) 02/27/2017   Decreased cardiac ejection fraction 02/27/2017   Left hand weakness 10/12/2016   Elevated antinuclear antibody (ANA) level 04/27/2016   Angina pectoris (HCC) 11/01/2015   Well controlled type 2 diabetes mellitus with gastroparesis (HCC) 06/22/2015   Low TSH level 06/22/2015   Iron deficiency anemia 04/07/2015   Intestinal metaplasia of gastric mucosa 03/11/2015   CAD (coronary artery  disease), native coronary artery 02/24/2015   History of coronary artery stent placement 02/24/2015   Chronic kidney disease, stage 3, mod decreased GFR (HCC) 02/24/2015   Menopause 02/24/2015   History of Helicobacter pylori infection 02/24/2015   Mild mitral insufficiency 02/24/2015   Mild tricuspid insufficiency 02/24/2015    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 (HCC) 02/24/2015   Controlled gout 02/24/2015   Depression, major, recurrent, mild (HCC) 02/24/2015   Mild pulmonary hypertension (HCC) 02/24/2015   Gastroesophageal reflux disease without esophagitis 02/24/2015   Perennial allergic rhinitis 02/24/2015   HLD (hyperlipidemia) 02/20/2015   Hypertension, benign 02/20/2015   Obstructive sleep apnea syndrome 02/20/2015   Controlled diabetes mellitus with stage 3 chronic kidney disease, without long-term current use of insulin  (HCC) 02/20/2015    Past Surgical History:  Procedure Laterality Date   ABDOMINAL HYSTERECTOMY     total   BREAST BIOPSY Right 06/07/2022   stereo bx, calcs, COIL clip-FIBROADENOMATOID CHANGE   CATARACT EXTRACTION     CATARACT EXTRACTION W/PHACO Left 03/24/2019   Procedure: CATARACT EXTRACTION PHACO AND INTRAOCULAR LENS PLACEMENT (IOC)  LEFT DIABETIC;  Surgeon: Myrna Adine Anes, MD;  Location: [facility];  Service: Ophthalmology;  Laterality: Left;  Diabetic - insulin  and oral meds sleep apnea   COLONOSCOPY  01/02/2014   sigmoid diverticulosis. desc colon TA, hyperplastic polyp   COLONOSCOPY N/A 01/25/2024   Procedure: COLONOSCOPY;  Surgeon: Cindie Carlin POUR, DO;  Location: [facility];  Service: Endoscopy;  Laterality: N/A;  915am, asa 2   COLONOSCOPY WITH PROPOFOL  N/A 09/02/2018   Procedure: COLONOSCOPY WITH PROPOFOL ;  Surgeon: Jinny Carmine, MD;  Location: [facility];  Service: Endoscopy;  Laterality: N/A;   CORONARY ANGIOPLASTY WITH STENT PLACEMENT     ESOPHAGOGASTRODUODENOSCOPY N/A 02/16/2015   negative h pylori, focal gastic intestinal metaplasia   ESOPHAGOGASTRODUODENOSCOPY (EGD) WITH PROPOFOL  N/A 09/02/2018   Procedure: ESOPHAGOGASTRODUODENOSCOPY (EGD) WITH BIOPSIES;  Surgeon: Jinny Carmine, MD;  Location: [facility];  Service: Endoscopy;  Laterality: N/A;  diabetic -insulin  and oral meds sleep apnea   TOTAL KNEE  ARTHROPLASTY Right     Family History  Problem Relation Age of Onset   Stroke Sister    Hyperlipidemia Sister    Alcohol abuse Brother    Diabetes Brother    Stroke Brother    Hypertension Mother    Diabetes Mother    Heart disease Mother    Diabetes Father    Heart disease Father    Hypertension Father    Hypertension Sister    Cancer Maternal Grandmother        Unsure   Diabetes Maternal Grandfather    Colon cancer Neg Hx    Liver disease Neg Hx    Breast cancer Neg Hx     Social History   Tobacco Use   Smoking status: Former    Current packs/day: 0.00    Average packs/day: 1 pack/day for 40.3 years (40.3 ttl pk-yrs)    Types: Cigarettes    Start date: 09/04/1972    Quit date: 12/30/2012    Years since quitting: 11.4   Smokeless tobacco: Never   Tobacco comments:    smoking cessation materials not required  Substance Use Topics   Alcohol use: No    Alcohol/week: 0.0 standard drinks of alcohol     Current Outpatient Medications:    ACCU-CHEK FASTCLIX LANCETS MISC, , Disp: , Rfl:    ACCU-CHEK SMARTVIEW test strip, Check fsbs two times daily  DMII, Disp: 100 each, Rfl: 6   Alcohol Swabs (B-D SINGLE USE SWABS REGULAR) PADS, 1 each by Does not apply route 4 (four) times daily., Disp: 200 each, Rfl: 2   allopurinol  (ZYLOPRIM ) 100 MG tablet, Take 1 tablet (100 mg total) by mouth daily., Disp: 90 tablet, Rfl: 1   amLODipine  (NORVASC ) 2.5 MG tablet, Take 1 tablet (2.5 mg total) by mouth daily., Disp: 90 tablet, Rfl: 1   aspirin  EC 81 MG tablet, Take 81 mg by mouth every morning., Disp: , Rfl:    BD INSULIN  SYRINGE U/F 31G X 5/16 0.3 ML MISC, , Disp: , Rfl:    blood glucose meter kit and supplies KIT, Dispense based on patient and insurance preference. Use up to four times daily as directed. (FOR ICD-9 250.00, 250.01)., Disp: 1 each, Rfl: 0   Cholecalciferol (VITAMIN D3) 50 MCG (2000 UT) TABS, Take 2,000 Units by mouth daily., Disp: , Rfl:    cloNIDine  (CATAPRES ) 0.1 MG  tablet, Take 1 tablet (0.1 mg total) by mouth every evening., Disp: 90 tablet, Rfl: 1   clotrimazole  (LOTRIMIN ) 1 % cream, Apply 1 Application topically 2 (two) times daily., Disp: 113 g, Rfl: 1   Coenzyme Q10 (CO Q-10) 100 MG CAPS, TAKE 1 CAPSULE EVERY DAY, Disp: 90 capsule, Rfl: 3   Dulaglutide (TRULICITY) 1.5 MG/0.5ML SOPN, Inject 1.5 mg into the skin once a week., Disp: , Rfl:    estradiol  (ESTRACE ) 0.5 MG tablet, Take 1 tablet (0.5 mg total) by mouth daily., Disp: 90 tablet, Rfl: 1   Ferrous Sulfate  (IRON) 325 (65 Fe) MG TABS, Take 325 mg by mouth daily. Nature made brand-, Disp: , Rfl:    fluticasone -salmeterol (ADVAIR) 250-50 MCG/ACT AEPB, Inhale 1 puff into the lungs in the morning and at bedtime., Disp: 180 each, Rfl: 1   ibuprofen (ADVIL) 800 MG tablet, Take 800 mg by mouth every 6 (six) hours as needed., Disp: , Rfl:    Insulin  Glargine (BASAGLAR  KWIKPEN) 100 UNIT/ML SOPN, Inject 60 Units into the skin at bedtime., Disp: , Rfl:    Insulin  Pen Needle (FIFTY50 PEN NEEDLES) 31G X 5 MM MISC, Use once daily, Disp: , Rfl:    ipratropium (ATROVENT ) 0.03 % nasal spray, Place 2 sprays into both nostrils every 12 (twelve) hours., Disp: 90 mL, Rfl: 1   isosorbide  mononitrate (IMDUR ) 60 MG 24 hr tablet, Take 1 tablet (60 mg total) by mouth daily., Disp: 90 tablet, Rfl: 1   loratadine  (CLARITIN ) 10 MG tablet, Take 1 tablet (10 mg total) by mouth daily., Disp: 90 tablet, Rfl: 1   losartan  (COZAAR ) 50 MG tablet, Take 1 tablet (50 mg total) by mouth daily., Disp: 90 tablet, Rfl: 1   metoCLOPramide  (REGLAN ) 5 MG tablet, TAKE 1 TABLET TWICE DAILY, Disp: 180 tablet, Rfl: 3   metoprolol  succinate (TOPROL -XL) 50 MG 24 hr tablet, Take 1 tablet (50 mg total) by mouth daily. Take with or immediately following a meal., Disp: 90 tablet, Rfl: 1   montelukast  (SINGULAIR ) 10 MG tablet, Take 1 tablet (10 mg total) by mouth every evening., Disp: 90 tablet, Rfl: 1   pantoprazole  (PROTONIX ) 40 MG tablet, Take 1 tablet  (40 mg total) by mouth every morning., Disp: 90 tablet, Rfl: 1   rosuvastatin  (CRESTOR ) 20 MG tablet, TAKE 1 TABLET (20 MG TOTAL) BY MOUTH DAILY., Disp: 90 tablet, Rfl: 3   SYNJARDY XR 12.01-999 MG TB24, Take 2 tablets by mouth daily., Disp: , Rfl:    talc  powder, Apply  topically as needed., Disp: 240 g, Rfl: 0   venlafaxine  XR (EFFEXOR -XR) 75 MG 24 hr capsule, Take 1 capsule (75 mg total) by mouth daily., Disp: 90 capsule, Rfl: 1  Allergies  Allergen Reactions   Codeine Other (See Comments)    Unknown reaction   Contrast Media [Iodinated Contrast Media] Itching   Lisinopril  Cough   Sulfa Antibiotics Itching    I personally reviewed active problem list, medication list, allergies with the patient/caregiver today.   ROS  Ten systems reviewed and is negative except as mentioned in HPI    Objective Physical Exam  CONSTITUTIONAL: Patient appears well-developed and well-nourished.  No distress. HEENT: Head atraumatic, normocephalic, neck supple. CARDIOVASCULAR: Normal rate, regular rhythm and normal heart sounds.  No murmur heard. No BLE edema. PULMONARY: Effort normal and breath sounds normal. No respiratory distress. ABDOMINAL: There is no tenderness or distention. MUSCULOSKELETAL: Normal gait. Without gross motor or sensory deficit. PSYCHIATRIC: Patient has a normal mood and affect. behavior is normal. Judgment and thought content normal.  Vitals:   05/22/24 0754  BP: 118/74  Pulse: 81  Resp: 16  SpO2: 92%  Weight: 210 lb 3.2 oz (95.3 kg)  Height: 5' 5.5 (1.664 m)    Body mass index is 34.45 kg/m.  Recent Results (from the past 2160 hours)  POCT urinalysis dipstick     Status: None   Collection Time: 04/08/24  2:02 PM  Result Value Ref Range   Color, UA yellow    Clarity, UA clear    Glucose, UA Negative Negative   Bilirubin, UA negative    Ketones, UA negative    Spec Grav, UA 1.015 1.010 - 1.025   Blood, UA negative    pH, UA 5.0 5.0 - 8.0   Protein, UA  Negative Negative   Urobilinogen, UA 0.2 0.2 or 1.0 E.U./dL   Nitrite, UA negative    Leukocytes, UA Negative Negative   Appearance clear    Odor none     Diabetic Foot Exam:     PHQ2/9:    05/22/2024    7:49 AM 01/15/2024    8:59 AM 11/20/2023    8:06 AM 05/31/2023   11:56 AM 05/23/2023    8:26 AM  Depression screen PHQ 2/9  Decreased Interest 0 0 0 0 0  Down, Depressed, Hopeless 0 0 0 0 0  PHQ - 2 Score 0 0 0 0 0  Altered sleeping 0 0 0  0  Tired, decreased energy 0 0 0  0  Change in appetite 0 0 0  0  Feeling bad or failure about yourself  0 0 0  0  Trouble concentrating 0 0 0  0  Moving slowly or fidgety/restless 0 0 0  0  Suicidal thoughts 0 0 0  0  PHQ-9 Score 0 0 0  0  Difficult doing work/chores Not difficult at all Not difficult at all Not difficult at all      phq 9 is negative  Fall Risk:    05/22/2024    7:49 AM 01/15/2024    8:59 AM 11/20/2023    8:04 AM 05/31/2023   11:54 AM 05/23/2023    8:26 AM  Fall Risk   Falls in the past year? 0 0 0 0 0  Number falls in past yr: 0 0 0 0   Injury with Fall? 0 0 0 0   Risk for fall due to : No Fall Risks No Fall Risks No Fall Risks Other (Comment)  No Fall Risks  Follow up Falls evaluation completed Falls prevention discussed;Education provided;Falls evaluation completed Falls prevention discussed;Education provided;Falls evaluation completed Education provided;Falls prevention discussed Falls prevention discussed      Assessment & Plan Right ovarian cyst (postmenopausal) Postmenopausal right ovarian cyst confirmed as simple on ultrasound. - Order repeat ultrasound in six months to monitor cyst size. - Schedule follow-up appointment no later than March.  Type 2 diabetes mellitus with gastroparesis, obesity, hypertension, and dyslipidemia Type 2 diabetes well-controlled with A1c of 6.7%. Gastroparesis managed with metoclopramide . - Continue Trulicity, Basaglar , Synjardy for diabetes. - Continue metoclopramide   before the largest meal for gastroparesis.  Hypertension Hypertension well-controlled with current regimen. Blood pressure 118/74 mmHg. - Continue losartan , metoprolol , clonidine , amlodipine . - Monitor for symptoms of low blood pressure; consider discontinuing clonidine  if symptoms occur.  Dyslipidemia Dyslipidemia well-managed with rosuvastatin . Cholesterol levels normal. - Continue rosuvastatin .  Stable angina and atherosclerosis of aorta Stable angina with no recent symptoms. Atherosclerosis managed with medications. - Continue Imdur  for angina symptom control - Continue statin therapy - continue ARB and Beta blocker plus aspirin . - Continue annual cardiology follow-up.  Pulmonary hypertension Under cardiology monitoring.  Obstructive sleep apnea Not currently managed with CPAP.  Centrilobular emphysema Asymptomatic. She has quit smoking.  Gastroesophageal reflux disease GERD managed with pantoprazole . - Refill pantoprazole .  Gout Asymptomatic. No recent flare-ups.  Allergic rhinitis Symptoms well-controlled with montelukast . - Continue montelukast .  Menopausal syndrome Significant relief from symptoms with venlafaxine  and Estrace . - Continue venlafaxine  and Estrace .  Depression in remission Depression in remission with venlafaxine . - Continue venlafaxine .  General Health Maintenance Due for a mammogram. Recent bone density screening completed. Flu shot received. - Order mammogram.

## 2024-06-19 ENCOUNTER — Ambulatory Visit
Admission: RE | Admit: 2024-06-19 | Discharge: 2024-06-19 | Disposition: A | Discharge status: Home / Self Care | Source: Ambulatory Visit | Attending: Family Medicine | Admitting: Family Medicine

## 2024-06-19 DIAGNOSIS — Z1231 Encounter for screening mammogram for malignant neoplasm of breast: Secondary | ICD-10-CM | POA: Insufficient documentation

## 2024-07-09 DIAGNOSIS — E1142 Type 2 diabetes mellitus with diabetic polyneuropathy: Secondary | ICD-10-CM | POA: Diagnosis not present

## 2024-07-15 DIAGNOSIS — E1142 Type 2 diabetes mellitus with diabetic polyneuropathy: Secondary | ICD-10-CM | POA: Diagnosis not present

## 2024-07-15 DIAGNOSIS — E1169 Type 2 diabetes mellitus with other specified complication: Secondary | ICD-10-CM | POA: Diagnosis not present

## 2024-07-15 DIAGNOSIS — Z1331 Encounter for screening for depression: Secondary | ICD-10-CM | POA: Diagnosis not present

## 2024-07-15 DIAGNOSIS — E785 Hyperlipidemia, unspecified: Secondary | ICD-10-CM | POA: Diagnosis not present

## 2024-07-15 DIAGNOSIS — E1159 Type 2 diabetes mellitus with other circulatory complications: Secondary | ICD-10-CM | POA: Diagnosis not present

## 2024-07-15 DIAGNOSIS — Z794 Long term (current) use of insulin: Secondary | ICD-10-CM | POA: Diagnosis not present

## 2024-08-05 ENCOUNTER — Telehealth: Payer: Self-pay | Admitting: Family Medicine

## 2024-08-05 DIAGNOSIS — J3089 Other allergic rhinitis: Secondary | ICD-10-CM

## 2024-08-05 DIAGNOSIS — M109 Gout, unspecified: Secondary | ICD-10-CM

## 2024-08-05 MED ORDER — LORATADINE 10 MG PO TABS: 10.0000 mg | ORAL_TABLET | Freq: Every day | ORAL | 1 refills | Status: AC

## 2024-08-05 MED ORDER — ALLOPURINOL 100 MG PO TABS: 100.0000 mg | ORAL_TABLET | Freq: Every day | ORAL | 1 refills | Status: AC

## 2024-08-05 NOTE — Telephone Encounter (Signed)
 Sent rx.

## 2024-08-05 NOTE — Telephone Encounter (Signed)
 allopurinol  (ZYLOPRIM ) 100 MG tablet   loratadine  (CLARITIN ) 10 MG tablet

## 2024-10-01 ENCOUNTER — Other Ambulatory Visit: Payer: Self-pay | Admitting: Family Medicine

## 2024-10-01 DIAGNOSIS — E1143 Type 2 diabetes mellitus with diabetic autonomic (poly)neuropathy: Secondary | ICD-10-CM

## 2024-10-01 DIAGNOSIS — I700 Atherosclerosis of aorta: Secondary | ICD-10-CM

## 2024-10-01 DIAGNOSIS — I209 Angina pectoris, unspecified: Secondary | ICD-10-CM

## 2024-10-02 NOTE — Telephone Encounter (Signed)
 Requested medications are due for refill today.  yes  Requested medications are on the active medications list.  yes  Last refill. 10/26/2023 #180 3 rf  Future visit scheduled.   yes  Notes to clinic.  Expired labs    Requested Prescriptions  Pending Prescriptions Disp Refills   metoCLOPramide  (REGLAN ) 5 MG tablet [Pharmacy Med Name: METOCLOPRAMIDE  HYDROCHLORIDE 5 MG Oral Tablet] 180 tablet 3    Sig: TAKE 1 TABLET TWICE DAILY     Not Delegated - Gastroenterology: Antiemetics - metoclopramide  Failed - 10/02/2024  1:46 PM      Failed - This refill cannot be delegated      Passed - Cr in normal range and within 360 days    Creat  Date Value Ref Range Status  11/20/2023 0.84 0.60 - 1.00 mg/dL Final   Creatinine, Urine  Date Value Ref Range Status  07/02/2023 67.4  Final         Passed - Valid encounter within last 6 months    Recent Outpatient Visits           4 months ago Atherosclerosis of abdominal aorta   [facility] Glenard Mire, MD   5 months ago Acute left flank pain   [facility] Bernardo Fend, DO   8 months ago Intertrigo   [facility] Glenard Mire, MD   10 months ago Centrilobular emphysema [facility])   [facility] Glenard Mire, MD              Signed Prescriptions Disp Refills   rosuvastatin  (CRESTOR ) 20 MG tablet 90 tablet 0    Sig: TAKE 1 TABLET (20 MG TOTAL) BY MOUTH DAILY.     Cardiovascular:  Antilipid - Statins 2 Failed - 10/02/2024  1:46 PM      Failed - Lipid Panel in normal range within the last 12 months    Cholesterol, Total  Date Value Ref Range Status  06/28/2015 143 100 - 199 mg/dL Final   Cholesterol  Date Value Ref Range Status  01/03/2024 108 0 - 200 Final   LDL Cholesterol (Calc)  Date Value Ref Range Status  05/22/2022 50 mg/dL (calc) Final    Comment:    Reference range: <100 . Desirable range <100 mg/dL for  primary prevention;   <70 mg/dL for patients with CHD or diabetic patients  with > or = 2 CHD risk factors. SABRA LDL-C is now calculated using the Martin-Hopkins  calculation, which is a validated novel method providing  better accuracy than the Friedewald equation in the  estimation of LDL-C.  Gladis APPLETHWAITE et al. SANDREA. 7986;689(80): 2061-2068  (http://education.[facility].com/faq/FAQ164)    LDL Cholesterol  Date Value Ref Range Status  01/03/2024 43  Final   HDL  Date Value Ref Range Status  01/03/2024 40 35 - 70 Final  06/28/2015 53 >39 mg/dL Final    Comment:    According to ATP-III Guidelines, HDL-C >59 mg/dL is considered a negative risk factor for CHD.    Triglycerides  Date Value Ref Range Status  01/03/2024 128 40 - 160 Final         Passed - Cr in normal range and within 360 days    Creat  Date Value Ref Range Status  11/20/2023 0.84 0.60 - 1.00 mg/dL Final   Creatinine, Urine  Date Value Ref Range Status  07/02/2023 67.4  Final         Passed -  Patient is not pregnant      Passed - Valid encounter within last 12 months    Recent Outpatient Visits           4 months ago Atherosclerosis of abdominal aorta   [facility] Glenard Mire, MD   5 months ago Acute left flank pain   [facility] Bernardo Fend, DO   8 months ago Intertrigo   [facility] Glenard Mire, MD   10 months ago Centrilobular emphysema [facility])   [facility] Sowles, Krichna, MD

## 2024-10-02 NOTE — Telephone Encounter (Signed)
 Requested Prescriptions  Pending Prescriptions Disp Refills   metoCLOPramide  (REGLAN ) 5 MG tablet [Pharmacy Med Name: METOCLOPRAMIDE  HYDROCHLORIDE 5 MG Oral Tablet] 180 tablet 3    Sig: TAKE 1 TABLET TWICE DAILY     Not Delegated - Gastroenterology: Antiemetics - metoclopramide  Failed - 10/02/2024  1:46 PM      Failed - This refill cannot be delegated      Passed - Cr in normal range and within 360 days    Creat  Date Value Ref Range Status  11/20/2023 0.84 0.60 - 1.00 mg/dL Final   Creatinine, Urine  Date Value Ref Range Status  07/02/2023 67.4  Final         Passed - Valid encounter within last 6 months    Recent Outpatient Visits           4 months ago Atherosclerosis of abdominal aorta   [facility] Glenard Mire, MD   5 months ago Acute left flank pain   [facility] Bernardo Fend, DO   8 months ago Intertrigo   [facility] Glenard Mire, MD   10 months ago Centrilobular emphysema [facility])   [facility] [place], Krichna, MD               rosuvastatin  (CRESTOR ) 20 MG tablet [Pharmacy Med Name: ROSUVASTATIN  CALCIUM  20 MG Oral Tablet] 90 tablet 0    Sig: TAKE 1 TABLET (20 MG TOTAL) BY MOUTH DAILY.     Cardiovascular:  Antilipid - Statins 2 Failed - 10/02/2024  1:46 PM      Failed - Lipid Panel in normal range within the last 12 months    Cholesterol, Total  Date Value Ref Range Status  06/28/2015 143 100 - 199 mg/dL Final   Cholesterol  Date Value Ref Range Status  01/03/2024 108 0 - 200 Final   LDL Cholesterol (Calc)  Date Value Ref Range Status  05/22/2022 50 mg/dL (calc) Final    Comment:    Reference range: <100 . Desirable range <100 mg/dL for primary prevention;   <70 mg/dL for patients with CHD or diabetic patients  with > or = 2 CHD risk factors. SABRA LDL-C is now calculated using the Martin-Hopkins  calculation, which is a validated novel  method providing  better accuracy than the Friedewald equation in the  estimation of LDL-C.  Gladis APPLETHWAITE et al. SANDREA. 7986;689(80): 2061-2068  (http://education.[facility].com/faq/FAQ164)    LDL Cholesterol  Date Value Ref Range Status  01/03/2024 43  Final   HDL  Date Value Ref Range Status  01/03/2024 40 35 - 70 Final  06/28/2015 53 >39 mg/dL Final    Comment:    According to ATP-III Guidelines, HDL-C >59 mg/dL is considered a negative risk factor for CHD.    Triglycerides  Date Value Ref Range Status  01/03/2024 128 40 - 160 Final         Passed - Cr in normal range and within 360 days    Creat  Date Value Ref Range Status  11/20/2023 0.84 0.60 - 1.00 mg/dL Final   Creatinine, Urine  Date Value Ref Range Status  07/02/2023 67.4  Final         Passed - Patient is not pregnant      Passed - Valid encounter within last 12 months    Recent Outpatient Visits           4 months ago Atherosclerosis of abdominal aorta  [facility] Glenard Mire, MD   5 months ago Acute left flank pain   [facility] Bernardo Fend, DO   8 months ago Intertrigo   [facility] Glenard Mire, MD   10 months ago Centrilobular emphysema [facility])   [facility] Sowles, Krichna, MD

## 2024-11-19 ENCOUNTER — Ambulatory Visit: Admitting: Family Medicine
